# Patient Record
Sex: Male | Born: 1940 | Race: White | Hispanic: No | Marital: Married | Smoking: Former smoker
Health system: Southern US, Community
[De-identification: ages and names within clinical notes are randomized; demographics above are authoritative.]

## PROBLEM LIST (undated history)

## (undated) DIAGNOSIS — E039 Hypothyroidism, unspecified: Secondary | ICD-10-CM

## (undated) DIAGNOSIS — E079 Disorder of thyroid, unspecified: Secondary | ICD-10-CM

## (undated) DIAGNOSIS — C3492 Malignant neoplasm of unspecified part of left bronchus or lung: Secondary | ICD-10-CM

## (undated) DIAGNOSIS — H9190 Unspecified hearing loss, unspecified ear: Secondary | ICD-10-CM

## (undated) DIAGNOSIS — I10 Essential (primary) hypertension: Secondary | ICD-10-CM

## (undated) DIAGNOSIS — Z720 Tobacco use: Secondary | ICD-10-CM

## (undated) DIAGNOSIS — R0602 Shortness of breath: Secondary | ICD-10-CM

## (undated) DIAGNOSIS — D0330 Melanoma in situ of unspecified part of face: Secondary | ICD-10-CM

## (undated) DIAGNOSIS — J449 Chronic obstructive pulmonary disease, unspecified: Secondary | ICD-10-CM

## (undated) DIAGNOSIS — N400 Enlarged prostate without lower urinary tract symptoms: Secondary | ICD-10-CM

## (undated) DIAGNOSIS — C801 Malignant (primary) neoplasm, unspecified: Secondary | ICD-10-CM

## (undated) DIAGNOSIS — J45909 Unspecified asthma, uncomplicated: Secondary | ICD-10-CM

## (undated) DIAGNOSIS — R918 Other nonspecific abnormal finding of lung field: Secondary | ICD-10-CM

## (undated) HISTORY — DX: Tobacco use: Z72.0

## (undated) HISTORY — DX: Unspecified hearing loss, unspecified ear: H91.90

## (undated) HISTORY — DX: Shortness of breath: R06.02

## (undated) HISTORY — PX: NO PAST SURGERIES: SHX2092

## (undated) HISTORY — DX: Unspecified asthma, uncomplicated: J45.909

## (undated) HISTORY — DX: Disorder of thyroid, unspecified: E07.9

## (undated) HISTORY — DX: Melanoma in situ of unspecified part of face: D03.30

## (undated) HISTORY — PX: MELANOMA EXCISION: SHX5266

---
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
## 2014-10-31 ENCOUNTER — Ambulatory Visit (INDEPENDENT_AMBULATORY_CARE_PROVIDER_SITE_OTHER): Payer: Medicare Other

## 2014-10-31 ENCOUNTER — Ambulatory Visit (INDEPENDENT_AMBULATORY_CARE_PROVIDER_SITE_OTHER): Payer: Medicare Other | Admitting: Physician Assistant

## 2014-10-31 VITALS — BP 126/76 | HR 88 | Temp 98.8°F | Resp 20 | Ht 70.5 in | Wt 242.2 lb

## 2014-10-31 DIAGNOSIS — R059 Cough, unspecified: Secondary | ICD-10-CM

## 2014-10-31 DIAGNOSIS — R05 Cough: Secondary | ICD-10-CM | POA: Diagnosis not present

## 2014-10-31 DIAGNOSIS — F17200 Nicotine dependence, unspecified, uncomplicated: Secondary | ICD-10-CM | POA: Diagnosis not present

## 2014-10-31 DIAGNOSIS — R0989 Other specified symptoms and signs involving the circulatory and respiratory systems: Secondary | ICD-10-CM | POA: Diagnosis not present

## 2014-10-31 DIAGNOSIS — J3489 Other specified disorders of nose and nasal sinuses: Secondary | ICD-10-CM | POA: Diagnosis not present

## 2014-10-31 DIAGNOSIS — J189 Pneumonia, unspecified organism: Secondary | ICD-10-CM | POA: Diagnosis not present

## 2014-10-31 DIAGNOSIS — R0689 Other abnormalities of breathing: Secondary | ICD-10-CM

## 2014-10-31 DIAGNOSIS — J181 Lobar pneumonia, unspecified organism: Secondary | ICD-10-CM

## 2014-10-31 HISTORY — DX: Nicotine dependence, unspecified, uncomplicated: F17.200

## 2014-10-31 LAB — COMPREHENSIVE METABOLIC PANEL
ALT: 16 U/L (ref 0–53)
AST: 18 U/L (ref 0–37)
Albumin: 3.8 g/dL (ref 3.5–5.2)
Alkaline Phosphatase: 84 U/L (ref 39–117)
BUN: 11 mg/dL (ref 6–23)
CO2: 27 mEq/L (ref 19–32)
CREATININE: 1.29 mg/dL (ref 0.50–1.35)
Calcium: 8.6 mg/dL (ref 8.4–10.5)
Chloride: 101 mEq/L (ref 96–112)
Glucose, Bld: 95 mg/dL (ref 70–99)
Potassium: 4.3 mEq/L (ref 3.5–5.3)
Sodium: 139 mEq/L (ref 135–145)
Total Bilirubin: 0.6 mg/dL (ref 0.2–1.2)
Total Protein: 6.5 g/dL (ref 6.0–8.3)

## 2014-10-31 LAB — POCT CBC
Granulocyte percent: 62.3 %G (ref 37–80)
HEMATOCRIT: 51.4 % (ref 43.5–53.7)
Hemoglobin: 16.7 g/dL (ref 14.1–18.1)
LYMPH, POC: 1.8 (ref 0.6–3.4)
MCH, POC: 29.6 pg (ref 27–31.2)
MCHC: 32.4 g/dL (ref 31.8–35.4)
MCV: 91.3 fL (ref 80–97)
MID (cbc): 0.9 (ref 0–0.9)
MPV: 6.7 fL (ref 0–99.8)
POC GRANULOCYTE: 4.4 (ref 2–6.9)
POC LYMPH PERCENT: 25.4 %L (ref 10–50)
POC MID %: 12.3 %M — AB (ref 0–12)
Platelet Count, POC: 210 10*3/uL (ref 142–424)
RBC: 5.63 M/uL (ref 4.69–6.13)
RDW, POC: 14.3 %
WBC: 7.1 10*3/uL (ref 4.6–10.2)

## 2014-10-31 MED ORDER — AZITHROMYCIN 250 MG PO TABS: ORAL_TABLET | ORAL | Status: DC

## 2014-10-31 MED ORDER — IPRATROPIUM BROMIDE 0.02 % IN SOLN
0.5000 mg | Freq: Once | RESPIRATORY_TRACT | Status: AC
  Administered 2014-10-31: 0.5 mg via RESPIRATORY_TRACT

## 2014-10-31 MED ORDER — ALBUTEROL SULFATE (2.5 MG/3ML) 0.083% IN NEBU
2.5000 mg | INHALATION_SOLUTION | Freq: Once | RESPIRATORY_TRACT | Status: AC
  Administered 2014-10-31: 2.5 mg via RESPIRATORY_TRACT

## 2014-10-31 MED ORDER — BENZONATATE 100 MG PO CAPS: 100.0000 mg | ORAL_CAPSULE | Freq: Three times a day (TID) | ORAL | Status: DC | PRN

## 2014-10-31 NOTE — Patient Instructions (Signed)
You have a pneumonia.  Please take the antibiotic. Two tabs today and one tab a day for the next 4 days.  Take the tessalon every 8 hours as needed for the cough.  Please come back to see [place] asap if you start feeling worse.  I'd like to see you in the next few weeks for a complete physical.  I'm in the building next door on July 19th. I would love to see you for an appointment at that time if you can schedule.

## 2014-10-31 NOTE — Progress Notes (Signed)
Subjective:    Patient ID: Levi [name], male    DOB: 15-Sep-1940, 74 y.o.   MRN: [id number]  Chief Complaint  Patient presents with  . Cough    x 5 days  . Sinusitis    pressure, drainage, headache  . Shortness of Breath    wheezing   There are no active problems to display for this patient.  Prior to Admission medications   Medication Sig Start Date End Date Taking? Authorizing Provider  aspirin 81 MG tablet Take 81 mg by mouth daily.   Yes Historical Provider, MD  levothyroxine (SYNTHROID, LEVOTHROID) 175 MCG tablet Take 175 mcg by mouth daily before breakfast.   Yes Historical Provider, MD   Medications, allergies, past medical history, surgical history, family history, social history and problem list reviewed and updated.  HPI  88 yom presents with cough, sinus pressure, sob, wheezing.   Sx started 5 days ago with mildly prod cough and wheeze. Cough has persisted. Throughout day. Denies chills. Has felt warm but not checked temp. Malaise past few days. Denies abd pain, n/v, diarrhea. No known sick contacts. Current 1/2 ppd smoker. Has smoked 55 yrs. He is new to [place], no records on file. Only goes to MD when sick. Looking to establish primary care with [place].   Review of Systems No cp.     Objective:   Physical Exam  Constitutional: He appears well-developed and well-nourished.  Non-toxic appearance. He does not have a sickly appearance. He does not appear ill. No distress.  BP 126/76 mmHg  Pulse 88  Temp(Src) 98.8 F (37.1 C) (Oral)  Resp 20  Ht 5' 10.5" (1.791 m)  Wt 242 lb 3.2 oz (109.861 kg)  BMI 34.25 kg/m2  SpO2 91%   HENT:  Right Ear: Tympanic membrane normal.  Left Ear: Tympanic membrane normal.  Nose: Mucosal edema and rhinorrhea present. Right sinus exhibits no maxillary sinus tenderness and no frontal sinus tenderness. Left sinus exhibits maxillary sinus tenderness. Left sinus exhibits no frontal sinus tenderness.  Mouth/Throat: Uvula is midline and  mucous membranes are normal. Posterior oropharyngeal erythema present. No oropharyngeal exudate, posterior oropharyngeal edema or tonsillar abscesses.  Cardiovascular: Normal rate, regular rhythm and normal heart sounds.   Pulmonary/Chest: Effort normal. No tachypnea. He has decreased breath sounds. He has no wheezes. He has no rhonchi. He has no rales.  Breath sounds decreased diffusely.   Lymphadenopathy:       Head (right side): Submandibular adenopathy present. No submental and no tonsillar adenopathy present.       Head (left side): Submandibular adenopathy present. No submental and no tonsillar adenopathy present.    He has no cervical adenopathy.   [facility] reading (PRIMARY) by  Dr. Everlene Farrier. Chest findings: Prominent right perihilar area. Concern for mass? Atelectatic area vs linear infiltrate right base.   Duoneb breathing test done.  Lung sounds post duoneb: Improved. Moving air better. No appreciable rales.   Results for orders placed or performed in visit on 10/31/14  POCT CBC  Result Value Ref Range   WBC 7.1 4.6 - 10.2 K/uL   Lymph, poc 1.8 0.6 - 3.4   POC LYMPH PERCENT 25.4 10 - 50 %L   MID (cbc) 0.9 0 - 0.9   POC MID % 12.3 (A) 0 - 12 %M   POC Granulocyte 4.4 2 - 6.9   Granulocyte percent 62.3 37 - 80 %G   RBC 5.63 4.69 - 6.13 M/uL   Hemoglobin 16.7 14.1 - 18.1  g/dL   HCT, POC 51.4 43.5 - 53.7 %   MCV 91.3 80 - 97 fL   MCH, POC 29.6 27 - 31.2 pg   MCHC 32.4 31.8 - 35.4 g/dL   RDW, POC 14.3 %   Platelet Count, POC 210 142 - 424 K/uL   MPV 6.7 0 - 99.8 fL      Assessment & Plan:   74 yom presents with cough, sinus pressure, sob, wheezing.   Cough - Plan: DG Chest 2 View, albuterol (PROVENTIL) (2.5 MG/3ML) 0.083% nebulizer solution 2.5 mg, ipratropium (ATROVENT) nebulizer solution 0.5 mg, POCT CBC, Comprehensive metabolic panel, benzonatate (TESSALON) 100 MG capsule Decreased breath sounds - Plan: albuterol (PROVENTIL) (2.5 MG/3ML) 0.083% nebulizer solution 2.5 mg,  ipratropium (ATROVENT) nebulizer solution 0.5 mg, POCT CBC, Comprehensive metabolic panel Sinus pressure Smoking addiction Right middle lobe pneumonia - Plan: azithromycin (ZITHROMAX) 250 MG tablet --vitals stable, moving air better after breathing tx, no leukocytosis --likely RML pneumonia on xr, azithro 5 days --tessalon for cough, mucinex for congestion --encouraged to see [place] within 2 weeks in 104 for primary care check --> likely discuss low dose ncct due to smoking hx at that time, get cpe at that time as pt does not see provider regularly, pt agreeable to this  Julieta Gutting, PA-C Physician Assistant-Certified [facility]  10/31/2014 11:58 AM

## 2014-11-17 DIAGNOSIS — M545 Low back pain: Secondary | ICD-10-CM | POA: Diagnosis not present

## 2014-12-19 ENCOUNTER — Ambulatory Visit (INDEPENDENT_AMBULATORY_CARE_PROVIDER_SITE_OTHER): Payer: Medicare Other | Admitting: Family Medicine

## 2014-12-19 ENCOUNTER — Ambulatory Visit (INDEPENDENT_AMBULATORY_CARE_PROVIDER_SITE_OTHER): Payer: Medicare Other

## 2014-12-19 VITALS — BP 130/74 | HR 69 | Temp 97.6°F | Resp 16 | Ht 70.25 in | Wt 240.8 lb

## 2014-12-19 DIAGNOSIS — R300 Dysuria: Secondary | ICD-10-CM

## 2014-12-19 DIAGNOSIS — N200 Calculus of kidney: Secondary | ICD-10-CM

## 2014-12-19 LAB — POCT URINALYSIS DIPSTICK
Bilirubin, UA: NEGATIVE
Glucose, UA: NEGATIVE
Ketones, UA: NEGATIVE
Leukocytes, UA: NEGATIVE
NITRITE UA: NEGATIVE
PROTEIN UA: NEGATIVE
SPEC GRAV UA: 1.015
UROBILINOGEN UA: 0.2
pH, UA: 5

## 2014-12-19 LAB — POCT UA - MICROSCOPIC ONLY
CRYSTALS, UR, HPF, POC: NEGATIVE
Casts, Ur, LPF, POC: NEGATIVE
Epithelial cells, urine per micros: NEGATIVE
Mucus, UA: NEGATIVE
WBC, UR, HPF, POC: NEGATIVE
Yeast, UA: NEGATIVE

## 2014-12-19 MED ORDER — TAMSULOSIN HCL 0.4 MG PO CAPS: 0.4000 mg | ORAL_CAPSULE | Freq: Every day | ORAL | Status: DC

## 2014-12-19 MED ORDER — HYDROCODONE-ACETAMINOPHEN 5-325 MG PO TABS: 2.0000 | ORAL_TABLET | Freq: Four times a day (QID) | ORAL | Status: DC | PRN

## 2014-12-19 MED ORDER — NAPROXEN 500 MG PO TABS: 500.0000 mg | ORAL_TABLET | Freq: Two times a day (BID) | ORAL | Status: DC

## 2014-12-19 NOTE — Progress Notes (Signed)
12/19/2014 at 10:03 AM  Levi [name] / DOB: Nov 25, 1940 / MRN: [id number]  The patient has Smoking addiction on his problem list.  SUBJECTIVE  Levi [name] is a 74 y.o. well appearing male presenting for the chief complaint of left sided back pain with radiation to the left testicle and dysuria that started last night.  Reports he awoke to this pain and reports "it was the most severe pain I have felt." Associates nausea and emesis x 1.  Reports urinating this morning before coming here and says his symptoms had resolved by the time he got in the car. He has a 50 pack year history of smoking. He receives physicals yearly at the [place] and has never been told he has blood in his urine.  He denies a history of kidney stone.  He has tried nothing for the pain.       He  has a past medical history of Thyroid disease.    Medications reviewed and updated by myself where necessary, and exist elsewhere in the encounter.   Levi [name] has No Known Allergies. He  reports that he has been smoking.  He does not have any smokeless tobacco history on file. He reports that he does not drink alcohol or use illicit drugs. He  has no sexual activity history on file. The patient  has no past surgical history on file.  His family history includes Aneurysm in his mother; Cancer in his father.  Review of Systems  Constitutional: Negative for fever and chills.  Respiratory: Negative for shortness of breath.   Cardiovascular: Negative for chest pain.  Gastrointestinal: Negative for nausea and abdominal pain.  Genitourinary: Positive for dysuria. Negative for hematuria.  Musculoskeletal: Positive for back pain.  Skin: Negative for rash.  Neurological: Negative for dizziness and headaches.    OBJECTIVE  His  height is 5' 10.25" (1.784 m) and weight is 240 lb 12.8 oz (109.226 kg). His oral temperature is 97.6 F (36.4 C). His blood pressure is 130/74 and his pulse is 69. His respiration is 16 and oxygen  saturation is 91%.  The patient's body mass index is 34.32 kg/(m^2).  Physical Exam  Vitals reviewed. Constitutional: He is oriented to person, place, and time. He appears well-developed. No distress.  Eyes: EOM are normal. Pupils are equal, round, and reactive to light. No scleral icterus.  Neck: Normal range of motion.  Cardiovascular: Normal rate and regular rhythm.   Respiratory: Effort normal and breath sounds normal.  GI: He exhibits no distension. There is no tenderness. There is no rigidity, no guarding and no CVA tenderness.  Musculoskeletal: Normal range of motion.  Neurological: He is alert and oriented to person, place, and time. No cranial nerve deficit.  Skin: Skin is warm and dry. No rash noted. He is not diaphoretic.  Psychiatric: He has a normal mood and affect.    Results for orders placed or performed in visit on 12/19/14 (from the past 24 hour(s))  POCT urinalysis dipstick     Status: None   Collection Time: 12/19/14  9:13 AM  Result Value Ref Range   Color, UA yellow    Clarity, UA clear    Glucose, UA neg    Bilirubin, UA neg    Ketones, UA neg    Spec Grav, UA 1.015    Blood, UA mod    pH, UA 5.0    Protein, UA neg    Urobilinogen, UA 0.2    Nitrite, UA  neg    Leukocytes, UA Negative Negative  POCT UA - Microscopic Only     Status: None   Collection Time: 12/19/14  9:13 AM  Result Value Ref Range   WBC, Ur, HPF, POC neg    RBC, urine, microscopic TNTC    Bacteria, U Microscopic trace    Mucus, UA neg    Epithelial cells, urine per micros neg    Crystals, Ur, HPF, POC neg    Casts, Ur, LPF, POC neg    Yeast, UA neg    [facility] reading (PRIMARY) by  Dr. Linna Darner: Normal KUB.   ASSESSMENT & PLAN  Levi [name] was seen today for back pain, urinary urgency, testicular pain and vomitting.  Diagnoses and all orders for this visit:  Dysuria -     POCT urinalysis dipstick -     POCT UA - Microscopic Only -     DG Abd 1 View; Future  Nephrolithiasis: KUB  negative.  It appears he may have passed a stone.  Rx'd below and adivsed copious fluids.  He is to RTC in one week for a clean catch urine only.   -     naproxen (NAPROSYN) 500 MG tablet; Take 1 tablet (500 mg total) by mouth 2 (two) times daily with a meal. Take for 1 week only. -     HYDROcodone-acetaminophen (NORCO) 5-325 MG per tablet; Take 2 tablets by mouth every 6 (six) hours as needed. -     tamsulosin (FLOMAX) 0.4 MG CAPS capsule; Take 1 capsule (0.4 mg total) by mouth daily. -     POCT urinalysis dipstick; Future -     POCT UA - Microscopic Only; Future    The patient was advised to call or come back to clinic if he does not see an improvement in symptoms, or worsens with the above plan.   Philis Fendt, MHS, PA-C [facility] 12/19/2014 10:03 AM

## 2014-12-19 NOTE — Progress Notes (Signed)
Discussed with Philis Fendt PAC. No kidney stone could be seen on the x-ray. Treatment discussed. The importance of following up on hematuria was discussed and agreed upon plan.  Fenton Malling. Virgel Paling.D.

## 2014-12-26 ENCOUNTER — Other Ambulatory Visit (INDEPENDENT_AMBULATORY_CARE_PROVIDER_SITE_OTHER): Payer: Medicare Other | Admitting: *Deleted

## 2014-12-26 DIAGNOSIS — N200 Calculus of kidney: Secondary | ICD-10-CM

## 2014-12-26 LAB — POCT URINALYSIS DIPSTICK
BILIRUBIN UA: NEGATIVE
Glucose, UA: NEGATIVE
Ketones, UA: NEGATIVE
Leukocytes, UA: NEGATIVE
NITRITE UA: NEGATIVE
PH UA: 5
PROTEIN UA: NEGATIVE
RBC UA: NEGATIVE
Spec Grav, UA: 1.01
Urobilinogen, UA: 0.2

## 2014-12-26 LAB — POCT UA - MICROSCOPIC ONLY
Bacteria, U Microscopic: NEGATIVE
CASTS, UR, LPF, POC: NEGATIVE
CRYSTALS, UR, HPF, POC: NEGATIVE
Epithelial cells, urine per micros: NEGATIVE
Mucus, UA: NEGATIVE
RBC, URINE, MICROSCOPIC: NEGATIVE
WBC, Ur, HPF, POC: NEGATIVE
Yeast, UA: NEGATIVE

## 2014-12-31 ENCOUNTER — Other Ambulatory Visit: Payer: Self-pay | Admitting: Physician Assistant

## 2015-01-07 ENCOUNTER — Ambulatory Visit (INDEPENDENT_AMBULATORY_CARE_PROVIDER_SITE_OTHER): Payer: Medicare Other

## 2015-01-07 ENCOUNTER — Ambulatory Visit (INDEPENDENT_AMBULATORY_CARE_PROVIDER_SITE_OTHER): Payer: Medicare Other | Admitting: Emergency Medicine

## 2015-01-07 VITALS — BP 126/78 | HR 75 | Temp 98.1°F | Resp 18 | Ht 71.0 in | Wt 239.0 lb

## 2015-01-07 DIAGNOSIS — R058 Other specified cough: Secondary | ICD-10-CM

## 2015-01-07 DIAGNOSIS — J22 Unspecified acute lower respiratory infection: Secondary | ICD-10-CM

## 2015-01-07 DIAGNOSIS — J988 Other specified respiratory disorders: Secondary | ICD-10-CM

## 2015-01-07 DIAGNOSIS — R05 Cough: Secondary | ICD-10-CM

## 2015-01-07 DIAGNOSIS — J189 Pneumonia, unspecified organism: Secondary | ICD-10-CM

## 2015-01-07 MED ORDER — HYDROCOD POLST-CPM POLST ER 10-8 MG/5ML PO SUER: 5.0000 mL | Freq: Every evening | ORAL | Status: AC | PRN

## 2015-01-07 MED ORDER — ALBUTEROL SULFATE HFA 108 (90 BASE) MCG/ACT IN AERS: 2.0000 | INHALATION_SPRAY | RESPIRATORY_TRACT | Status: DC | PRN

## 2015-01-07 MED ORDER — DOXYCYCLINE HYCLATE 100 MG PO CAPS: 100.0000 mg | ORAL_CAPSULE | Freq: Two times a day (BID) | ORAL | Status: DC

## 2015-01-07 MED ORDER — GUAIFENESIN ER 1200 MG PO TB12: 1.0000 | ORAL_TABLET | Freq: Two times a day (BID) | ORAL | Status: DC | PRN

## 2015-01-07 MED ORDER — BENZONATATE 100 MG PO CAPS: 100.0000 mg | ORAL_CAPSULE | Freq: Three times a day (TID) | ORAL | Status: DC | PRN

## 2015-01-07 MED ORDER — AMOXICILLIN-POT CLAVULANATE 875-125 MG PO TABS: 1.0000 | ORAL_TABLET | Freq: Two times a day (BID) | ORAL | Status: AC

## 2015-01-07 NOTE — Progress Notes (Signed)
[facility] [address] [place] [address] [phone]  Date:  01/07/2015   Name:  Levi [name]   DOB:  Aug 15, 1940   MRN:  [id number]  PCP:  No PCP Per Patient    History of Present Illness:  Levi [name] is a 74 y.o. male patient who presents to [facility] for chief complaint of productive cough and fatigue for the last 5 days.  Phlegm is white.  He has mildly sore throat.  He denies sob or dyspnea, but states that his wife complains of his wheezing moreso over the last 5 days.  He had some nasal congestion that appears to be resolving for him.  He also feels some lightheadedness.  He has been working and staying busy around the home, but is more ocncerned of coughing at this time, as it has been keeping him up at night.  He is a smoker of 1 pk per 3-4 days.  He does not hydrate well at this time.  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  Recent abx use.  No abdominal pain, nausea, or vomiting.    Patient Active Problem List   Diagnosis Date Noted  . Smoking addiction 10/31/2014    Past Medical History  Diagnosis Date  . Thyroid disease     History reviewed. No pertinent past surgical history.  Social History  Substance Use Topics  . Smoking status: Current Every Day Smoker  . Smokeless tobacco: None  . Alcohol Use: No    Family History  Problem Relation Age of Onset  . Aneurysm Mother   . Cancer Father     No Known Allergies  Medication list has been reviewed and updated.  Current Outpatient Prescriptions on File Prior to Visit  Medication Sig Dispense Refill  . aspirin 81 MG tablet Take 81 mg by mouth daily.    Marland Kitchen HYDROcodone-acetaminophen (NORCO) 5-325 MG per tablet Take 2 tablets by mouth every 6 (six) hours as needed. 30 tablet 0  . levothyroxine (SYNTHROID, LEVOTHROID) 175 MCG tablet Take 175 mcg by mouth daily before breakfast.    . naproxen (NAPROSYN) 500 MG tablet Take 1 tablet (500 mg total) by mouth 2 (two) times daily  with a meal. Take for 1 week only. 30 tablet 0  . tamsulosin (FLOMAX) 0.4 MG CAPS capsule Take 1 capsule (0.4 mg total) by mouth daily. (Patient not taking: Reported on 01/07/2015) 30 capsule 3   No current facility-administered medications on file prior to visit.    ROS ROS otherwise unremarkable unless listed above.   Physical Examination: BP 126/78 mmHg  Pulse 75  Temp(Src) 98.1 F (36.7 C) (Oral)  Resp 18  Ht '5\' 11"'$  (1.803 m)  Wt 239 lb (108.41 kg)  BMI 33.35 kg/m2  SpO2 95% Ideal Body Weight: Weight in (lb) to have BMI = 25: 178.9  Physical Exam  Constitutional: He is oriented to person, place, and time. He appears well-developed and well-nourished. No distress.  HENT:  Head: Normocephalic and atraumatic.  Eyes: Conjunctivae and EOM are normal. Pupils are equal, round, and reactive to light.  Cardiovascular: Normal rate, regular rhythm, normal heart sounds and intact distal pulses.  Exam reveals no friction rub.   No murmur heard. Pulmonary/Chest: Effort normal and breath sounds normal. No respiratory distress. He has no wheezes.  Neurological: He is alert and oriented to person, place, and time.  Skin: Skin is warm and dry. He is not diaphoretic.  Psychiatric: He has  a normal mood and affect. His behavior is normal.    [facility] reading (PRIMARY) by  Dr. Everlene Farrier: Markings in the right base.  No consolidation.   Assessment and Plan: 74 year old male smoker who is here today for chief complaint of productive cough. -Given augmentin at this time.  Also supportive tx for cough with mucinex and cough suppressants.  Will avoid phototoxic abx. at this time.  -Smoking cessation discussed.  Likely culprit for recent illnesses.  CAP (community acquired pneumonia)  Lower respiratory infection (e.g., bronchitis, pneumonia, pneumonitis, pulmonitis) - Plan:  albuterol (PROVENTIL HFA;VENTOLIN HFA) 108 (90 BASE) MCG/ACT inhaler, amoxicillin-clavulanate (AUGMENTIN) 875-125 MG per tablet,  benzonatate (TESSALON) 100 MG capsule  Productive cough - Plan: DG Chest 2 View, Guaifenesin (MUCINEX MAXIMUM STRENGTH) 1200 MG TB12, chlorpheniramine-HYDROcodone (TUSSIONEX PENNKINETIC ER) 10-8 MG/5ML SUER, albuterol (PROVENTIL HFA;VENTOLIN HFA) 108 (90 BASE) MCG/ACT inhaler, benzonatate (TESSALON) 100 MG capsule   Ivar Drape, PA-C [facility] 01/07/2015 1:08 PM

## 2015-01-07 NOTE — Patient Instructions (Signed)
Please hydrate well, with 64oz of water per day.  (almost 4 regular sized water bottles) Use delsym over the counter for day cough.  Pneumonia Pneumonia is an infection of the lungs.  CAUSES Pneumonia may be caused by bacteria or a virus. Usually, these infections are caused by breathing infectious particles into the lungs (respiratory tract). SIGNS AND SYMPTOMS   Cough.  Fever.  Chest pain.  Increased rate of breathing.  Wheezing.  Mucus production. DIAGNOSIS  If you have the common symptoms of pneumonia, your health care provider will typically confirm the diagnosis with a chest X-ray. The X-ray will show an abnormality in the lung (pulmonary infiltrate) if you have pneumonia. Other tests of your blood, urine, or sputum may be done to find the specific cause of your pneumonia. Your health care provider may also do tests (blood gases or pulse oximetry) to see how well your lungs are working. TREATMENT  Some forms of pneumonia may be spread to other people when you cough or sneeze. You may be asked to wear a mask before and during your exam. Pneumonia that is caused by bacteria is treated with antibiotic medicine. Pneumonia that is caused by the influenza virus may be treated with an antiviral medicine. Most other viral infections must run their course. These infections will not respond to antibiotics.  HOME CARE INSTRUCTIONS   Cough suppressants may be used if you are losing too much rest. However, coughing protects you by clearing your lungs. You should avoid using cough suppressants if you can.  Your health care provider may have prescribed medicine if he or she thinks your pneumonia is caused by bacteria or influenza. Finish your medicine even if you start to feel better.  Your health care provider may also prescribe an expectorant. This loosens the mucus to be coughed up.  Take medicines only as directed by your health care provider.  Do not smoke. Smoking is a common cause of  bronchitis and can contribute to pneumonia. If you are a smoker and continue to smoke, your cough may last several weeks after your pneumonia has cleared.  A cold steam vaporizer or humidifier in your room or home may help loosen mucus.  Coughing is often worse at night. Sleeping in a semi-upright position in a recliner or using a couple pillows under your head will help with this.  Get rest as you feel it is needed. Your body will usually let you know when you need to rest. PREVENTION A pneumococcal shot (vaccine) is available to prevent a common bacterial cause of pneumonia. This is usually suggested for:  People over 23 years old.  Patients on chemotherapy.  People with chronic lung problems, such as bronchitis or emphysema.  People with immune system problems. If you are over 65 or have a high risk condition, you may receive the pneumococcal vaccine if you have not received it before. In some countries, a routine influenza vaccine is also recommended. This vaccine can help prevent some cases of pneumonia.You may be offered the influenza vaccine as part of your care. If you smoke, it is time to quit. You may receive instructions on how to stop smoking. Your health care provider can provide medicines and counseling to help you quit. SEEK MEDICAL CARE IF: You have a fever. SEEK IMMEDIATE MEDICAL CARE IF:   Your illness becomes worse. This is especially true if you are elderly or weakened from any other disease.  You cannot control your cough with suppressants and are losing  sleep.  You begin coughing up blood.  You develop pain which is getting worse or is uncontrolled with medicines.  Any of the symptoms which initially brought you in for treatment are getting worse rather than better.  You develop shortness of breath or chest pain. MAKE SURE YOU:   Understand these instructions.  Will watch your condition.  Will get help right away if you are not doing well or get  worse. Document Released: 04/18/2005 Document Revised: 09/02/2013 Document Reviewed: 07/08/2010 [facility] Patient Information 2015 [place], [place]. This information is not intended to replace advice given to you by your health care provider. Make sure you discuss any questions you have with your health care provider.

## 2015-10-16 ENCOUNTER — Ambulatory Visit (INDEPENDENT_AMBULATORY_CARE_PROVIDER_SITE_OTHER): Payer: Medicare Other | Admitting: Family Medicine

## 2015-10-16 VITALS — BP 114/70 | HR 97 | Temp 99.2°F | Resp 20 | Ht 71.0 in | Wt 238.2 lb

## 2015-10-16 DIAGNOSIS — J209 Acute bronchitis, unspecified: Secondary | ICD-10-CM | POA: Diagnosis not present

## 2015-10-16 DIAGNOSIS — Z720 Tobacco use: Secondary | ICD-10-CM | POA: Diagnosis not present

## 2015-10-16 MED ORDER — ALBUTEROL SULFATE HFA 108 (90 BASE) MCG/ACT IN AERS: 2.0000 | INHALATION_SPRAY | Freq: Four times a day (QID) | RESPIRATORY_TRACT | Status: DC | PRN

## 2015-10-16 MED ORDER — AZITHROMYCIN 500 MG PO TABS: 500.0000 mg | ORAL_TABLET | Freq: Every day | ORAL | Status: DC

## 2015-10-16 MED ORDER — GUAIFENESIN-CODEINE 100-10 MG/5ML PO SOLN: 5.0000 mL | ORAL | Status: DC | PRN

## 2015-10-16 NOTE — Progress Notes (Signed)
Subjective:  By signing my name below, I, Raven Small, attest that this documentation has been prepared under the direction and in the presence of Delman Cheadle , MD.  Electronically Signed: Thea Alken, ED Scribe. 10/16/2015. 8:32 AM.   Patient ID: Levi [name], male    DOB: 1941-04-15, 75 y.o.   MRN: [id number]  HPI Chief Complaint  Patient presents with  . Cough    x4 days, has had pneumonia in the past    HPI Comments: Levi [name] is a 75 y.o. male who presents to the [facility] complaining of a productive cough consisting of white sputum that began 4 days ago. Pt states symptoms started with sinus congestion.  Cough worsened 2 days ago and has been keeping him awake at night. Pt has tried Copywriter, advertising without relief to symptoms. Pt smokes about half a pack a day. He has hx of pnuemonia and reports current symptoms feel similar. Pt denies CP, SOB, palpitation, chest tightness.   Patient Active Problem List   Diagnosis Date Noted  . 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 Thyroid disease    History reviewed. No pertinent past surgical history. No Known Allergies Prior to Admission medications   Medication Sig Start Date End Date Taking? Authorizing Provider  aspirin 81 MG tablet Take 81 mg by mouth daily.   Yes Historical Provider, MD  levothyroxine (SYNTHROID, LEVOTHROID) 175 MCG tablet Take 175 mcg by mouth daily before breakfast.   Yes Historical Provider, MD   Review of Systems  Constitutional: Negative for fever and chills.  HENT: Positive for congestion. Negative for sore throat.   Respiratory: Positive for cough. Negative for chest tightness and shortness of breath.   Cardiovascular: Negative for chest pain.       Objective:   Physical Exam  Constitutional: He is oriented to person, place, and time. He appears well-developed and well-nourished. No distress.  HENT:  Head: Normocephalic and atraumatic.  Right  Ear: Tympanic membrane is retracted.  Left Ear: Tympanic membrane is injected and retracted.  Mouth/Throat: Posterior oropharyngeal erythema present.  Nares with erythema  Eyes: Conjunctivae and EOM are normal.  Neck: Neck supple.  Cardiovascular: Normal rate, regular rhythm, S1 normal and S2 normal.   No murmur heard. Pulmonary/Chest: Effort normal. He has rales ( slight, inspiratory, cleared with normal breathing).  Good air moement   Musculoskeletal: Normal range of motion.  Lymphadenopathy:       Head (right side): Submandibular adenopathy present.       Head (left side): Submandibular adenopathy present.    He has cervical adenopathy ( anterior).       Right cervical: No posterior cervical adenopathy present.      Left cervical: No posterior cervical adenopathy present.       Right: No supraclavicular adenopathy present.       Left: No supraclavicular adenopathy present.  Neurological: He is alert and oriented to person, place, and time.  Skin: Skin is warm and dry.  Psychiatric: He has a normal mood and affect. His behavior is normal.  Nursing note and vitals reviewed.   Filed Vitals:   10/16/15 0825  BP: 114/70  Pulse: 97  Temp: 99.2 F (37.3 C)  TempSrc: Oral  Resp: 20  Height: '5\' 11"'$  (1.803 m)  Weight: 238 lb 3.2 oz (108.047 kg)  SpO2: 95%    Assessment & Plan:   1. Acute bronchitis, unspecified organism  2. Tobacco abuse     Meds ordered this encounter  Medications  . azithromycin (ZITHROMAX) 500 MG tablet    Sig: Take 1 tablet (500 mg total) by mouth daily.    Dispense:  3 tablet    Refill:  0  . guaiFENesin-codeine 100-10 MG/5ML syrup    Sig: Take 5-10 mLs by mouth every 4 (four) hours as needed for cough.    Dispense:  180 mL    Refill:  0    Pt told to RTC if no significant improvement in 3 days   I personally performed the services described in this documentation, which was scribed in my presence. The recorded information has been reviewed and  considered, and addended by me as needed.   Delman Cheadle, M.D.  [facility] [address] [place], [place] [address] [phone] phone [phone] fax  10/16/2015 9:51 AM

## 2015-10-16 NOTE — Patient Instructions (Addendum)
   IF you received an x-ray today, you will receive an invoice from [facility]. Please contact [facility] at [phone] with questions or concerns regarding your invoice.   IF you received labwork today, you will receive an invoice from [facility]. Please contact [facility] at [phone] with questions or concerns regarding your invoice.   Our billing staff will not be able to assist you with questions regarding bills from these companies.  You will be contacted with the lab results as soon as they are available. The fastest way to get your results is to activate your My Chart account. Instructions are located on the last page of this paperwork. If you have not heard from us regarding the results in 2 weeks, please contact this office.    Acute Bronchitis Bronchitis is inflammation of the airways that extend from the windpipe into the lungs (bronchi). The inflammation often causes mucus to develop. This leads to a cough, which is the most common symptom of bronchitis.  In acute bronchitis, the condition usually develops suddenly and goes away over time, usually in a couple weeks. Smoking, allergies, and asthma can make bronchitis worse. Repeated episodes of bronchitis may cause further lung problems.  CAUSES Acute bronchitis is most often caused by the same virus that causes a cold. The virus can spread from person to person (contagious) through coughing, sneezing, and touching contaminated objects. SIGNS AND SYMPTOMS   Cough.   Fever.   Coughing up mucus.   Body aches.   Chest congestion.   Chills.   Shortness of breath.   Sore throat.  DIAGNOSIS  Acute bronchitis is usually diagnosed through a physical exam. Your health care provider will also ask you questions about your medical history. Tests, such as chest X-rays, are sometimes done to rule out other conditions.  TREATMENT  Acute bronchitis usually goes away in a  couple weeks. Oftentimes, no medical treatment is necessary. Medicines are sometimes given for relief of fever or cough. Antibiotic medicines are usually not needed but may be prescribed in certain situations. In some cases, an inhaler may be recommended to help reduce shortness of breath and control the cough. A cool mist vaporizer may also be used to help thin bronchial secretions and make it easier to clear the chest.  HOME CARE INSTRUCTIONS  Get plenty of rest.   Drink enough fluids to keep your urine clear or pale yellow (unless you have a medical condition that requires fluid restriction). Increasing fluids may help thin your respiratory secretions (sputum) and reduce chest congestion, and it will prevent dehydration.   Take medicines only as directed by your health care provider.  If you were prescribed an antibiotic medicine, finish it all even if you start to feel better.  Avoid smoking and secondhand smoke. Exposure to cigarette smoke or irritating chemicals will make bronchitis worse. If you are a smoker, consider using nicotine gum or skin patches to help control withdrawal symptoms. Quitting smoking will help your lungs heal faster.   Reduce the chances of another bout of acute bronchitis by washing your hands frequently, avoiding people with cold symptoms, and trying not to touch your hands to your mouth, nose, or eyes.   Keep all follow-up visits as directed by your health care provider.  SEEK MEDICAL CARE IF: Your symptoms do not improve after 1 week of treatment.  SEEK IMMEDIATE MEDICAL CARE IF:  You develop an increased fever or chills.   You have chest pain.     You have severe shortness of breath.  You have bloody sputum.   You develop dehydration.  You faint or repeatedly feel like you are going to pass out.  You develop repeated vomiting.  You develop a severe headache. MAKE SURE YOU:   Understand these instructions.  Will watch your  condition.  Will get help right away if you are not doing well or get worse.   This information is not intended to replace advice given to you by your health care provider. Make sure you discuss any questions you have with your health care provider.   Document Released: 05/26/2004 Document Revised: 05/09/2014 Document Reviewed: 10/09/2012 Elsevier Interactive Patient Education 2016 Elsevier Inc.  

## 2015-10-16 NOTE — Addendum Note (Signed)
Addended by: Jannette Spanner on: 10/16/2015 10:13 AM   Modules accepted: Orders

## 2015-10-20 ENCOUNTER — Ambulatory Visit (INDEPENDENT_AMBULATORY_CARE_PROVIDER_SITE_OTHER): Payer: Medicare Other

## 2015-10-20 ENCOUNTER — Ambulatory Visit (INDEPENDENT_AMBULATORY_CARE_PROVIDER_SITE_OTHER): Payer: Medicare Other | Admitting: Family Medicine

## 2015-10-20 VITALS — BP 122/74 | HR 74 | Temp 98.2°F | Resp 18 | Ht 71.0 in | Wt 233.0 lb

## 2015-10-20 DIAGNOSIS — R05 Cough: Secondary | ICD-10-CM | POA: Diagnosis not present

## 2015-10-20 DIAGNOSIS — J439 Emphysema, unspecified: Secondary | ICD-10-CM | POA: Diagnosis not present

## 2015-10-20 DIAGNOSIS — F17200 Nicotine dependence, unspecified, uncomplicated: Secondary | ICD-10-CM | POA: Diagnosis not present

## 2015-10-20 DIAGNOSIS — J441 Chronic obstructive pulmonary disease with (acute) exacerbation: Secondary | ICD-10-CM

## 2015-10-20 LAB — POCT CBC
Granulocyte percent: 74.1 %G (ref 37–80)
HEMATOCRIT: 46.9 % (ref 43.5–53.7)
Hemoglobin: 16.5 g/dL (ref 14.1–18.1)
Lymph, poc: 1.6 (ref 0.6–3.4)
MCH: 31.6 pg — AB (ref 27–31.2)
MCHC: 35.2 g/dL (ref 31.8–35.4)
MCV: 90 fL (ref 80–97)
MID (CBC): 0.7 (ref 0–0.9)
MPV: 7.6 fL (ref 0–99.8)
POC GRANULOCYTE: 6.7 (ref 2–6.9)
POC LYMPH PERCENT: 17.9 %L (ref 10–50)
POC MID %: 8 % (ref 0–12)
Platelet Count, POC: 198 10*3/uL (ref 142–424)
RBC: 5.21 M/uL (ref 4.69–6.13)
RDW, POC: 13.3 %
WBC: 9.1 10*3/uL (ref 4.6–10.2)

## 2015-10-20 MED ORDER — PREDNISONE 20 MG PO TABS: 40.0000 mg | ORAL_TABLET | Freq: Every day | ORAL | Status: DC

## 2015-10-20 MED ORDER — AMOXICILLIN-POT CLAVULANATE 875-125 MG PO TABS: 1.0000 | ORAL_TABLET | Freq: Two times a day (BID) | ORAL | Status: DC

## 2015-10-20 MED ORDER — GUAIFENESIN-CODEINE 100-10 MG/5ML PO SOLN: 5.0000 mL | ORAL | Status: DC | PRN

## 2015-10-20 MED ORDER — IPRATROPIUM BROMIDE 0.02 % IN SOLN
0.5000 mg | Freq: Once | RESPIRATORY_TRACT | Status: AC
  Administered 2015-10-20: 0.5 mg via RESPIRATORY_TRACT

## 2015-10-20 MED ORDER — ALBUTEROL SULFATE (2.5 MG/3ML) 0.083% IN NEBU
2.5000 mg | INHALATION_SOLUTION | Freq: Once | RESPIRATORY_TRACT | Status: AC
  Administered 2015-10-20: 2.5 mg via RESPIRATORY_TRACT

## 2015-10-20 NOTE — Patient Instructions (Addendum)
IF you received an x-ray today, you will receive an invoice from [facility]. Please contact [facility] at [phone] with questions or concerns regarding your invoice.   IF you received labwork today, you will receive an invoice from [employer]. Please contact [facility] at [phone] with questions or concerns regarding your invoice.   Our billing staff will not be able to assist you with questions regarding bills from these companies.  You will be contacted with the lab results as soon as they are available. The fastest way to get your results is to activate your My Chart account. Instructions are located on the last page of this paperwork. If you have not heard from [place] regarding the results in 2 weeks, please contact this office.    Chronic Obstructive Pulmonary Disease Chronic obstructive pulmonary disease (COPD) is a common lung condition in which airflow from the lungs is limited. COPD is a general term that can be used to describe many different lung problems that limit airflow, including both chronic bronchitis and emphysema. If you have COPD, your lung function will probably never return to normal, but there are measures you can take to improve lung function and make yourself feel better. CAUSES   Smoking (common).  Exposure to secondhand smoke.  Genetic problems.  Chronic inflammatory lung diseases or recurrent infections. SYMPTOMS  Shortness of breath, especially with physical activity.  Deep, persistent (chronic) cough with a large amount of thick mucus.  Wheezing.  Rapid breaths (tachypnea).  Gray or bluish discoloration (cyanosis) of the skin, especially in your fingers, toes, or lips.  Fatigue.  Weight loss.  Frequent infections or episodes when breathing symptoms become much worse (exacerbations).  Chest tightness. DIAGNOSIS Your health care provider will take a medical history and perform a physical  examination to diagnose COPD. Additional tests for COPD may include:  Lung (pulmonary) function tests.  Chest X-ray.  CT scan.  Blood tests. TREATMENT  Treatment for COPD may include:  Inhaler and nebulizer medicines. These help manage the symptoms of COPD and make your breathing more comfortable.  Supplemental oxygen. Supplemental oxygen is only helpful if you have a low oxygen level in your blood.  Exercise and physical activity. These are beneficial for nearly all people with COPD.  Lung surgery or transplant.  Nutrition therapy to gain weight, if you are underweight.  Pulmonary rehabilitation. This may involve working with a team of health care providers and specialists, such as respiratory, occupational, and physical therapists. HOME CARE INSTRUCTIONS  Take all medicines (inhaled or pills) as directed by your health care provider.  Avoid over-the-counter medicines or cough syrups that dry up your airway (such as antihistamines) and slow down the elimination of secretions unless instructed otherwise by your health care provider.  If you are a smoker, the most important thing that you can do is stop smoking. Continuing to smoke will cause further lung damage and breathing trouble. Ask your health care provider for help with quitting smoking. He or she can direct you to community resources or hospitals that provide support.  Avoid exposure to irritants such as smoke, chemicals, and fumes that aggravate your breathing.  Use oxygen therapy and pulmonary rehabilitation if directed by your health care provider. If you require home oxygen therapy, ask your health care provider whether you should purchase a pulse oximeter to measure your oxygen level at home.  Avoid contact with individuals who have a contagious illness.  Avoid extreme temperature and humidity changes.  Eat healthy foods. Eating smaller, more frequent meals and resting before meals may help you maintain your  strength.  Stay active, but balance activity with periods of rest. Exercise and physical activity will help you maintain your ability to do things you want to do.  Preventing infection and hospitalization is very important when you have COPD. Make sure to receive all the vaccines your health care provider recommends, especially the pneumococcal and influenza vaccines. Ask your health care provider whether you need a pneumonia vaccine.  Learn and use relaxation techniques to manage stress.  Learn and use controlled breathing techniques as directed by your health care provider. Controlled breathing techniques include:  Pursed lip breathing. Start by breathing in (inhaling) through your nose for 1 second. Then, purse your lips as if you were going to whistle and breathe out (exhale) through the pursed lips for 2 seconds.  Diaphragmatic breathing. Start by putting one hand on your abdomen just above your waist. Inhale slowly through your nose. The hand on your abdomen should move out. Then purse your lips and exhale slowly. You should be able to feel the hand on your abdomen moving in as you exhale.  Learn and use controlled coughing to clear mucus from your lungs. Controlled coughing is a series of short, progressive coughs. The steps of controlled coughing are: 1. Lean your head slightly forward. 2. Breathe in deeply using diaphragmatic breathing. 3. Try to hold your breath for 3 seconds. 4. Keep your mouth slightly open while coughing twice. 5. Spit any mucus out into a tissue. 6. Rest and repeat the steps once or twice as needed. SEEK MEDICAL CARE IF:  You are coughing up more mucus than usual.  There is a change in the color or thickness of your mucus.  Your breathing is more labored than usual.  Your breathing is faster than usual. SEEK IMMEDIATE MEDICAL CARE IF:  You have shortness of breath while you are resting.  You have shortness of breath that prevents you from:  Being  able to talk.  Performing your usual physical activities.  You have chest pain lasting longer than 5 minutes.  Your skin color is more cyanotic than usual.  You measure low oxygen saturations for longer than 5 minutes with a pulse oximeter. MAKE SURE YOU:  Understand these instructions.  Will watch your condition.  Will get help right away if you are not doing well or get worse.   This information is not intended to replace advice given to you by your health care provider. Make sure you discuss any questions you have with your health care provider.   Document Released: 01/26/2005 Document Revised: 05/09/2014 Document Reviewed: 12/13/2012 Elsevier Interactive Patient Education [employer].

## 2015-10-20 NOTE — Progress Notes (Signed)
By signing my name below, I, Mesha Guinyard, attest that this documentation has been prepared under the direction and in the presence of Delman Cheadle, MD.  Electronically Signed: Verlee Monte, Medical Scribe. 10/20/2015. 9:36 AM.  Subjective:    Patient ID: Levi [name], male    DOB: 1940-10-19, 75 y.o.   MRN: [id number]  HPI Chief Complaint  Patient presents with  . Follow-up  . Bronchitis    HPI Comments: Levi [name] is a 75 y.o. male who presents to the [facility] for follow-up. Pt was seen 4 days ago for cough and congestion. He smokes .5 PPP and PMHx of PNA. He had a relative regular nl pulomanry exam- so placed on ZITHROMAX 500 mg tablets a day for 3 days, which he would have finished yesterday, as well as Codene cough syrup that has helped his symptoms and Albuerol BID. Pt took OTC BC for his sore throat. Pt thought he was feeling better 3 days ago but his symptoms came back. Pt reports productive cough with white sputum- doesn't nl cough stuff up. Pt can't take deep breaths. Pt tried to eat an egg 3 days ago and he had heart burn afterwards- as only eaten a bowl of oatmeal in the past 3 days. Pt has only had 2-3 cigarrettes a day. Pt doesn't remember taking Prednisone in the past, but his wife takes it.  Patient Active Problem List   Diagnosis Date Noted  . 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 Thyroid disease    History reviewed. No pertinent past surgical history. No Known Allergies Prior to Admission medications   Medication Sig Start Date End Date Taking? Authorizing Provider  albuterol (PROVENTIL HFA;VENTOLIN HFA) 108 (90 Base) MCG/ACT inhaler Inhale 2 puffs into the lungs every 6 (six) hours as needed for wheezing or shortness of breath. 10/16/15  Yes Shawnee Knapp, MD  aspirin 81 MG tablet Take 81 mg by mouth daily.   Yes Historical Provider, MD  guaiFENesin-codeine 100-10 MG/5ML syrup Take 5-10 mLs by mouth every  4 (four) hours as needed for cough. 10/16/15  Yes Shawnee Knapp, MD  levothyroxine (SYNTHROID, LEVOTHROID) 175 MCG tablet Take 175 mcg by mouth daily before breakfast.   Yes Historical Provider, MD  azithromycin (ZITHROMAX) 500 MG tablet Take 1 tablet (500 mg total) by mouth daily. Patient not taking: Reported on 10/20/2015 10/16/15   Shawnee Knapp, MD   Social History   Social History  . Marital Status: Unknown    Spouse Name: N/A  . Number of Children: N/A  . Years of Education: N/A   Occupational History  . Not on file.   Social History Main Topics  . Smoking status: Current Every Day Smoker  . Smokeless tobacco: Not on file  . Alcohol Use: No  . Drug Use: No  . Sexual Activity: Not on file   Other Topics Concern  . Not on file   Social History Narrative   Depression screen [facility] 2/9 10/20/2015 10/16/2015 01/07/2015 12/19/2014 10/31/2014  Decreased Interest 0 0 0 0 0  Down, Depressed, Hopeless 0 0 0 0 0  PHQ - 2 Score 0 0 0 0 0   Review of Systems  Constitutional: Positive for appetite change.  HENT: Positive for congestion, sinus pressure and sore throat. Negative for ear pain.   Respiratory: Positive for cough and shortness of breath.   Cardiovascular: Negative for chest pain and palpitations.  Objective:  BP 122/74 mmHg  Pulse 74  Temp(Src) 98.2 F (36.8 C) (Oral)  Resp 18  Ht '5\' 11"'$  (1.803 m)  Wt 233 lb (105.688 kg)  BMI 32.51 kg/m2  SpO2 95%  Physical Exam  Constitutional: He appears well-developed and well-nourished. No distress.  HENT:  Head: Normocephalic and atraumatic.  Right Ear: Tympanic membrane is injected and retracted.  Left Ear: Tympanic membrane is injected and retracted.  Mouth/Throat: Posterior oropharyngeal erythema present.  Toungue with white coating Nares nl  Eyes: Conjunctivae are normal.  Neck: Neck supple.  Cardiovascular: Normal rate and regular rhythm.  Exam reveals distant heart sounds.   Pulmonary/Chest: Effort normal.  Decreased air  movement of right lung base Expiratory rhonchi in left lung base  Neurological: He is alert.  Skin: Skin is warm and dry.  Psychiatric: He has a normal mood and affect. His behavior is normal.  Nursing note and vitals reviewed.  Results for orders placed or performed in visit on 10/20/15  POCT CBC  Result Value Ref Range   WBC 9.1 4.6 - 10.2 K/uL   Lymph, poc 1.6 0.6 - 3.4   POC LYMPH PERCENT 17.9 10 - 50 %L   MID (cbc) 0.7 0 - 0.9   POC MID % 8.0 0 - 12 %M   POC Granulocyte 6.7 2 - 6.9   Granulocyte percent 74.1 37 - 80 %G   RBC 5.21 4.69 - 6.13 M/uL   Hemoglobin 16.5 14.1 - 18.1 g/dL   HCT, POC 46.9 43.5 - 53.7 %   MCV 90.0 80 - 97 fL   MCH, POC 31.6 (A) 27 - 31.2 pg   MCHC 35.2 31.8 - 35.4 g/dL   RDW, POC 13.3 %   Platelet Count, POC 198 142 - 424 K/uL   MPV 7.6 0 - 99.8 fL   Dg Chest 2 View  10/20/2015  CLINICAL DATA:  75 year old male with cough and congestion. Decreased air movement on auscultation of the lung bases. Initial encounter. COPD. EXAM: CHEST  2 VIEW COMPARISON:  01/07/2015, 10/31/2014. FINDINGS: Stable large lung volumes. Mediastinal contours are stable and within normal limits. Visualized tracheal air column is within normal limits. No pneumothorax or pulmonary edema. No pleural effusion or consolidation. No acute pulmonary opacity at the lung bases. Mild diffuse increased interstitial markings in both lungs with some attenuation of upper lobe bronchovascular markings, more so on the right. No acute osseous abnormality identified. 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 No acute cardiopulmonary abnormality identified. Electronically Signed   By: Genevie Ann M.D.   On: 10/20/2015 10:02   [10:27 AM] Pt states the Albuterol treatment has losened up his chest. Decreased air movement throughout.   Assessment & Plan:   1. COPD exacerbation ([place])   failed azithro 500 qd x 3d. Will try augmentin instead since he is having sinusitis sxs as well and treat  copd with prednisone burst. Ongoing tob use - encouraged cessation. Scheduled nebs qid while ill.  Orders Placed This Encounter  Procedures  . DG Chest 2 View    Standing Status: Future     Number of Occurrences: 1     Standing Expiration Date: 10/19/2016    Order Specific Question:  Reason for Exam (SYMPTOM  OR DIAGNOSIS REQUIRED)    Answer:  cough and congestion, decreased air movment at bases, copd    Order Specific Question:  Preferred imaging location?    Answer:  External  . POCT CBC    Meds ordered  this encounter  Medications  . albuterol (PROVENTIL) (2.5 MG/3ML) 0.083% nebulizer solution 2.5 mg    Sig:   . ipratropium (ATROVENT) nebulizer solution 0.5 mg    Sig:   . predniSONE (DELTASONE) 20 MG tablet    Sig: Take 2 tablets (40 mg total) by mouth daily with breakfast.    Dispense:  10 tablet    Refill:  0  . amoxicillin-clavulanate (AUGMENTIN) 875-125 MG tablet    Sig: Take 1 tablet by mouth 2 (two) times daily.    Dispense:  20 tablet    Refill:  0  . guaiFENesin-codeine 100-10 MG/5ML syrup    Sig: Take 5-10 mLs by mouth every 4 (four) hours as needed for cough.    Dispense:  180 mL    Refill:  0    I personally performed the services described in this documentation, which was scribed in my presence. The recorded information has been reviewed and considered, and addended by me as needed.   Delman Cheadle, M.D.  [facility] [address] [place], [place] [address] [phone] phone [phone] fax  10/23/2015 10:22 AM

## 2015-10-23 DIAGNOSIS — J439 Emphysema, unspecified: Secondary | ICD-10-CM | POA: Insufficient documentation

## 2016-12-23 ENCOUNTER — Telehealth: Payer: Self-pay | Admitting: Internal Medicine

## 2016-12-23 NOTE — Telephone Encounter (Signed)
Patient Is on 2018 [facility] Patient Reassign for the [facility] Priority list for the [facility] Medicare.  [facility] asks [place] to contact patient to come in for an appointment or do they have another PCP?    Spoke with wife, Amando Chaput who states that patient does not see Dr. Delman Cheadle and has never seen Dr. Brigitte Pulse.  Advised that he had seen her back in June, 2017.  Both she and patient say no, he hasn't seen her.  Went over why he was there and then they both say, yes he did see her for that.  However, patient see the [facility].  And, he goes back to the [facility] on 8/30.  However, Dr. Renold Genta IS on the patient's card as PCP.  I asked the wife to call [facility] and have them remove Dr. Renold Genta as the PCP and replace with his doctor's name at the [place].    The wife said they initially said that Bevelyn Buckles would be his PCP (because that is the wife's PCP), but he has never seen Dr. Sharlett Iles either.  So, wife is supposed to get a name on 8/30 when he goes back to the [facility] and then call [facility] and notify them of the PCP name at that time and get Dr. Verlene Mayer name off of the his current [employer] card.   They do NOT want to set up an appointment with Dr. Renold Genta.    Call #1

## 2018-07-02 ENCOUNTER — Other Ambulatory Visit (HOSPITAL_COMMUNITY): Payer: Self-pay | Admitting: Family Medicine

## 2018-07-10 ENCOUNTER — Telehealth: Payer: Self-pay | Admitting: *Deleted

## 2018-07-10 ENCOUNTER — Encounter: Payer: Self-pay | Admitting: *Deleted

## 2018-07-10 DIAGNOSIS — Z122 Encounter for screening for malignant neoplasm of respiratory organs: Secondary | ICD-10-CM

## 2018-07-10 DIAGNOSIS — Z87891 Personal history of nicotine dependence: Secondary | ICD-10-CM

## 2018-07-10 NOTE — Telephone Encounter (Signed)
Received referral for initial lung cancer screening scan. Contacted patient and obtained smoking history,( current, 62 pack year ) as well as answering questions related to screening process. Patient denies signs of lung cancer such as weight loss or hemoptysis. Patient denies comorbidity that would prevent curative treatment if lung cancer were found. Patient is scheduled for shared decision making visit and CT scan on 07/31/18 at 2pm.

## 2018-07-12 ENCOUNTER — Other Ambulatory Visit: Payer: Self-pay | Admitting: Family Medicine

## 2018-07-12 DIAGNOSIS — N19 Unspecified kidney failure: Secondary | ICD-10-CM

## 2018-07-23 ENCOUNTER — Telehealth: Payer: Self-pay | Admitting: *Deleted

## 2018-07-23 NOTE — Telephone Encounter (Signed)
Patient notified/message left to notify patient that due to current restrictions lung screening appointments are cancelled and patient will be contacted regarding rescheduling.

## 2018-07-31 ENCOUNTER — Inpatient Hospital Stay: Payer: Medicare Other | Admitting: Oncology

## 2018-07-31 ENCOUNTER — Ambulatory Visit: Payer: Medicare Other

## 2018-07-31 ENCOUNTER — Ambulatory Visit
Admission: RE | Admit: 2018-07-31 | Discharge: 2018-07-31 | Disposition: A | Discharge status: Home / Self Care | Payer: No Typology Code available for payment source | Source: Ambulatory Visit | Attending: Family Medicine | Admitting: Family Medicine

## 2018-07-31 ENCOUNTER — Other Ambulatory Visit: Payer: Self-pay

## 2018-07-31 DIAGNOSIS — N19 Unspecified kidney failure: Secondary | ICD-10-CM | POA: Insufficient documentation

## 2018-09-12 ENCOUNTER — Telehealth: Payer: Self-pay | Admitting: *Deleted

## 2018-09-12 NOTE — Telephone Encounter (Signed)
Contacted and rescheduled for lung screening scan, see prior note for eligibility information.

## 2018-09-21 ENCOUNTER — Other Ambulatory Visit: Payer: Self-pay

## 2018-09-21 ENCOUNTER — Ambulatory Visit
Admission: RE | Admit: 2018-09-21 | Discharge: 2018-09-21 | Disposition: A | Discharge status: Home / Self Care | Payer: Medicare Other | Source: Ambulatory Visit | Attending: Oncology | Admitting: Oncology

## 2018-09-21 ENCOUNTER — Inpatient Hospital Stay: Payer: Medicare Other | Attending: Nurse Practitioner | Admitting: Nurse Practitioner

## 2018-09-21 DIAGNOSIS — Z122 Encounter for screening for malignant neoplasm of respiratory organs: Secondary | ICD-10-CM | POA: Diagnosis not present

## 2018-09-21 DIAGNOSIS — Z87891 Personal history of nicotine dependence: Secondary | ICD-10-CM

## 2018-09-21 NOTE — Progress Notes (Signed)
Virtual Visit via Telemedicine Note   I connected with Levi [name] on 09/21/18 EST at 11:30 by video enabled telemedicine visit and verified that I am speaking with the correct person using two identifiers.   I discussed the limitations, risks, security and privacy concerns of performing an evaluation and management service by telemedicine and the availability of in-person appointments. I also discussed with the patient that there may be a patient responsible charge related to this service. The patient expressed understanding and agreed to proceed.   Other persons participating in the visit and their role in the encounter: Burgess Estelle, RN  Patient's location: clinic  Provider's location: home  Chief Complaint: Low Dose CT Screening  Patient agreed to evaluation by telephone/telemedicine to discuss shared decision making for consideration of low dose CT lung cancer screening.    In accordance with CMS guidelines, patient has met eligibility criteria including age, absence of signs or symptoms of lung cancer.  Social History   Tobacco Use  . Smoking status: Current Every Day Smoker    Packs/day: 1.00    Years: 62.00    Pack years: 62.00    Types: Cigarettes  Substance Use Topics  . Alcohol use: No    Alcohol/week: 0.0 standard drinks     A shared decision-making session was conducted prior to the performance of CT scan. This includes one or more decision aids, includes benefits and harms of screening, follow-up diagnostic testing, over-diagnosis, false positive rate, and total radiation exposure.   Counseling on the importance of adherence to annual lung cancer LDCT screening, impact of co-morbidities, and ability or willingness to undergo diagnosis and treatment is imperative for compliance of the program.   Counseling on the importance of continued smoking cessation for former smokers; the importance of smoking cessation for current smokers, and information about tobacco  cessation interventions have been given to patient including [place] and [address] quit [place] programs.   Written order for lung cancer screening with LDCT has been given to the patient and any and all questions have been answered to the best of my abilities.    Yearly follow up will be coordinated by Burgess Estelle, Thoracic Navigator.  I discussed the assessment and treatment plan with the patient. The patient was provided an opportunity to ask questions and all were answered. The patient agreed with the plan and demonstrated an understanding of the instructions.   The patient was advised to call back or seek an in-person evaluation if the symptoms worsen or if the condition fails to improve as anticipated.   I provided 15 minutes of face-to-face video visit time during this encounter, and > 50% was spent counseling as documented under my assessment & plan.   Beckey Rutter, DNP, AGNP-C [place] at [facility] [phone] (work cell) [phone] (office)

## 2018-09-25 ENCOUNTER — Telehealth: Payer: Self-pay | Admitting: *Deleted

## 2018-09-25 NOTE — Telephone Encounter (Signed)
Left voicemail with PCP's nurse to return call. Dr[facility]

## 2018-09-25 NOTE — Telephone Encounter (Signed)
Noted LDCT results, contacted PCP office, [facility]. Left message for return call to discuss who would manage recommended follow up of suspicious finding.

## 2018-09-26 ENCOUNTER — Telehealth: Payer: Self-pay | Admitting: *Deleted

## 2018-09-26 NOTE — Telephone Encounter (Signed)
Contacted PCP, Dr. Raoul Pitch, and reviewed lung screening results. Faxed results to PCP, contacted patient and reviewed results of CT scan and likely follow up next steps. PCP will follow up with patient and advise me regarding [facility]. Patient knows to contact me if he has not spoken with he PCP's office within 1 day.   IMPRESSION: 1. Lung-RADS 4B, suspicious. Additional imaging evaluation or consultation with Pulmonology or Thoracic Surgery recommended. 2. Diffuse bronchial wall thickening with emphysema, as above; imaging findings suggestive of underlying COPD. 3. Coronary artery atherosclerotic calcifications.  Aortic Atherosclerosis (ICD10-I70.0) and Emphysema (ICD10-J43.9).

## 2018-10-01 ENCOUNTER — Encounter: Payer: Self-pay | Admitting: *Deleted

## 2018-10-01 DIAGNOSIS — R911 Solitary pulmonary nodule: Secondary | ICD-10-CM

## 2018-10-01 NOTE — Progress Notes (Signed)
  Oncology Nurse Navigator Documentation  Navigator Location: [facility]Med Onc (10/01/18 1100) Referral date to RadOnc/MedOnc: 10/01/18 (10/01/18 1100) )Navigator Encounter Type: Introductory phone call (10/01/18 1100)   Abnormal Finding Date: 09/21/18 (10/01/18 1100)                   Treatment Phase: Abnormal Scans (10/01/18 1100) Barriers/Navigation Needs: Coordination of Care (10/01/18 1100)   Interventions: Coordination of Care (10/01/18 1100)   Coordination of Care: Appts;Radiology (10/01/18 1100)        Acuity: Level 2 (10/01/18 1100)   Acuity Level 2: Initial guidance, education and coordination as needed;Educational needs;Assistance expediting appointments (10/01/18 1100)    phone call made to patient to introduce to navigator services and review upcoming appts. All questions answered at the time of visit. Pt is aware of appt scheduled for Dr. Grayland Ormond and Dr. Patsey Berthold in the lung clinic on Fri 6/5 at 9:30am. PET scan will be obtained after consult on Friday. Contact info given and instructed to call with any further questions or needs. Pt verbalized understanding.  Time Spent with Patient: 30 (10/01/18 1100)

## 2018-10-02 ENCOUNTER — Telehealth: Payer: Self-pay | Admitting: Oncology

## 2018-10-02 NOTE — Telephone Encounter (Signed)
Patient is a 78 year old male who had low dose CT chest for lung cancer screening on Sep 21, 2018 which noted a 2.13cm spiculated nodule in the left upper lobe. He has a extensive history of tobacco use (62 pack years). 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    Patient has been referred to the [facility] and will require a PET scan for further evaluation and to optimize management.  PET scan is also needed to assist in further diagnostic studies including possible biopsy.

## 2018-10-03 ENCOUNTER — Encounter: Payer: Self-pay | Admitting: Licensed Clinical Social Worker

## 2018-10-04 ENCOUNTER — Other Ambulatory Visit: Payer: Medicare Other

## 2018-10-05 ENCOUNTER — Inpatient Hospital Stay: Payer: No Typology Code available for payment source | Attending: Oncology | Admitting: Oncology

## 2018-10-05 ENCOUNTER — Encounter: Payer: Self-pay | Admitting: Oncology

## 2018-10-05 ENCOUNTER — Other Ambulatory Visit: Payer: Self-pay

## 2018-10-05 ENCOUNTER — Encounter: Payer: Self-pay | Admitting: *Deleted

## 2018-10-05 ENCOUNTER — Encounter: Payer: Self-pay | Admitting: Pulmonary Disease

## 2018-10-05 ENCOUNTER — Ambulatory Visit (INDEPENDENT_AMBULATORY_CARE_PROVIDER_SITE_OTHER): Payer: No Typology Code available for payment source | Admitting: Pulmonary Disease

## 2018-10-05 VITALS — BP 180/105 | HR 78 | Temp 98.7°F | Ht 72.0 in | Wt 253.0 lb

## 2018-10-05 DIAGNOSIS — R911 Solitary pulmonary nodule: Secondary | ICD-10-CM

## 2018-10-05 DIAGNOSIS — Z809 Family history of malignant neoplasm, unspecified: Secondary | ICD-10-CM | POA: Diagnosis not present

## 2018-10-05 DIAGNOSIS — F17210 Nicotine dependence, cigarettes, uncomplicated: Secondary | ICD-10-CM | POA: Insufficient documentation

## 2018-10-05 DIAGNOSIS — Z86006 Personal history of melanoma in-situ: Secondary | ICD-10-CM | POA: Diagnosis not present

## 2018-10-05 DIAGNOSIS — C3412 Malignant neoplasm of upper lobe, left bronchus or lung: Secondary | ICD-10-CM | POA: Diagnosis present

## 2018-10-05 DIAGNOSIS — R918 Other nonspecific abnormal finding of lung field: Secondary | ICD-10-CM | POA: Diagnosis not present

## 2018-10-05 DIAGNOSIS — J449 Chronic obstructive pulmonary disease, unspecified: Secondary | ICD-10-CM | POA: Insufficient documentation

## 2018-10-05 DIAGNOSIS — R0602 Shortness of breath: Secondary | ICD-10-CM | POA: Insufficient documentation

## 2018-10-05 NOTE — Patient Instructions (Signed)
1.  You will be hearing from my office with regards to your scan and your biopsy.

## 2018-10-05 NOTE — Progress Notes (Signed)
Patient is here today to establish care for a lung nodule. Patient stated that he stays SOB. Patient continues to smoke.

## 2018-10-05 NOTE — Progress Notes (Signed)
  Oncology Nurse Navigator Documentation  Navigator Location: [facility] (10/05/18 1000)   )Navigator Encounter Type: Clinic/MDC (10/05/18 1000)               Multidisiplinary Clinic Date: 10/05/18 (10/05/18 1000) Multidisiplinary Clinic Type: Thoracic (10/05/18 1000)   Patient Visit Type: MedOnc(Pulmonary) (10/05/18 1000) Treatment Phase: Abnormal Scans (10/05/18 1000) Barriers/Navigation Needs: Coordination of Care (10/05/18 1000)   Interventions: Coordination of Care (10/05/18 1000)   Coordination of Care: Appts;Broncoscopy (10/05/18 1000)             Met with patient during visit with Dr. Grayland Ormond and Dr. Patsey Berthold in the lung [place]. All questions answered during visit. Reviewed upcoming appts. Informed that we will be in touch with further appts. Contact info given and instructed to call with any further questions or needs. Pt verbalized understanding.     Time Spent with Patient: 60 (10/05/18 1000)

## 2018-10-05 NOTE — H&P (View-Only) (Signed)
Subjective:    Patient ID: Levi [name], male    DOB: [birth year], 78 y.o.   MRN: [id number]  HPI Patient is a 78 year old current smoker (4 cigarettes/day) who is seen in the [facility] he is referred by the [place] [facility] (associated with the [facility]) for evaluation of a left upper lobe mass.  The patient underwent lung cancer screening imaging on 21 Sep 2018 and a 2 cm nodule was noted on the left upper lobe.  There is also a small satellite nodule on the right upper lobe that measures 5 mm.  These films have been reviewed independently.  The patient is to have PET CT on 8 June.  The patient has had shortness of breath but is at baseline.  He notes that he has been having this of breath for several years.  He does takes only as needed albuterol and states that he really has not had any medical problems until the past year.  He has not had any wheezing.  He has a cough that mostly occurs in the mornings productive of whitish sputum.  This has not changed in character.  No hemoptysis.  No orthopnea or paroxysmal nocturnal dyspnea.  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  This was performed in [place].  His past medical history, surgical history, family history have been reviewed.  Social history: Currently smokes 1 to 4 cigarettes/day.  This has been over the past year.  Previously smoked 1 to 1-1/2 packs of cigarettes per day and has done so for approximately 60 years.  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    Review of Systems  Constitutional: Negative.   HENT: Negative.   Eyes: Negative.   Respiratory: Positive for cough, chest tightness and shortness of breath.   Cardiovascular: Negative.   Gastrointestinal: Negative.   Endocrine: Negative.   Genitourinary:       Prostatic hypertrophy  Musculoskeletal: Negative.   Skin:  Negative.   Hematological: Negative.   Psychiatric/Behavioral: Negative.   All other systems reviewed and are negative.      Objective:   Physical Exam Vitals signs and nursing note reviewed.  Constitutional:      Appearance: Normal appearance. He is not ill-appearing or toxic-appearing.  HENT:     Head: Normocephalic and atraumatic.     Right Ear: External ear normal.     Left Ear: External ear normal.     Nose:     Comments: Nose/mouth/throat not examined due to masking requirements for COVID-19.   Eyes:     General: No scleral icterus.    Conjunctiva/sclera: Conjunctivae normal.     Pupils: Pupils are equal, round, and reactive to light.  Neck:     Musculoskeletal: Neck supple.     Thyroid: No thyromegaly.     Vascular: No JVD.     Trachea: Trachea and phonation normal.  Cardiovascular:     Rate and Rhythm: Normal rate and regular rhythm.     Pulses: Normal pulses.     Heart sounds: Normal heart sounds. No murmur.  Pulmonary:     Effort: Pulmonary effort is normal. No respiratory distress.     Breath sounds: No wheezing, rhonchi or rales.     Comments: Diminished breath sounds throughout. Abdominal:     General: Abdomen is protuberant.  Musculoskeletal: Normal range of motion.  Right lower leg: No edema.     Left lower leg: No edema.  Lymphadenopathy:     Cervical: No cervical adenopathy.  Skin:    General: Skin is warm and dry.  Neurological:     General: No focal deficit present.     Mental Status: He is alert and oriented to person, place, and time.  Psychiatric:        Mood and Affect: Mood normal.        Behavior: Behavior normal.    The patient's films were independently reviewed.       Assessment & Plan:   1.  Left upper lobe lung mass, carcinoma until proven otherwise: Patient will need tissue diagnosis.  Recommend bronchoscopy with navigational assistance.  Benefits, limitations and potential complications of the procedure were discussed with  the patient/family  including, but not limited to bleeding, hemoptysis, respiratory failure requiring intubation and/or prolongued mechanical ventilation, infection, pneumothorax (collapse of lung) requiring chest tube placement, stroke from air embolism or even death.  Patient agrees to proceed.  He will need super dimension CT to be able to developed a map for the navigation program.  The patient also has a PET/CT ordered for 8 June.  If mediastinal adenopathy is noted endobronchial ultrasound will also be performed.  Date of procedure TBD.  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  3.  Hypertension: The patient has not been noted to be hypertensive on his clinic visits per his report.  He states that he is "nervous".  This may be related to anxiety of the visit today.  4.  Obesity: This issue adds complexity to his management.  5.  Tobacco dependence due to cigarettes: Patient was counseled regards to discontinuation of smoking.   Case discussed with Dr. Grayland Ormond.   This chart was dictated using voice recognition software/Dragon.  Despite best efforts to proofread, errors can occur which can change the meaning.  Any change was purely unintentional.

## 2018-10-05 NOTE — Progress Notes (Signed)
Subjective:    Patient ID: Levi [name], male    DOB: 21-Sep-1940, 78 y.o.   MRN: [id number]  HPI Patient is a 78 year old current smoker (4 cigarettes/day) who is seen in the [facility] he is referred by the [place] [facility] (associated with the [facility]) for evaluation of a left upper lobe mass.  The patient underwent lung cancer screening imaging on 21 Sep 2018 and a 2 cm nodule was noted on the left upper lobe.  There is also a small satellite nodule on the right upper lobe that measures 5 mm.  These films have been reviewed independently.  The patient is to have PET CT on 8 June.  The patient has had shortness of breath but is at baseline.  He notes that he has been having this of breath for several years.  He does takes only as needed albuterol and states that he really has not had any medical problems until the past year.  He has not had any wheezing.  He has a cough that mostly occurs in the mornings productive of whitish sputum.  This has not changed in character.  No hemoptysis.  No orthopnea or paroxysmal nocturnal dyspnea.  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  This was performed in [place].  His past medical history, surgical history, family history have been reviewed.  Social history: Currently smokes 1 to 4 cigarettes/day.  This has been over the past year.  Previously smoked 1 to 1-1/2 packs of cigarettes per day and has done so for approximately 60 years.  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    Review of Systems  Constitutional: Negative.   HENT: Negative.   Eyes: Negative.   Respiratory: Positive for cough, chest tightness and shortness of breath.   Cardiovascular: Negative.   Gastrointestinal: Negative.   Endocrine: Negative.   Genitourinary:       Prostatic hypertrophy  Musculoskeletal: Negative.   Skin:  Negative.   Hematological: Negative.   Psychiatric/Behavioral: Negative.   All other systems reviewed and are negative.      Objective:   Physical Exam Vitals signs and nursing note reviewed.  Constitutional:      Appearance: Normal appearance. He is not ill-appearing or toxic-appearing.  HENT:     Head: Normocephalic and atraumatic.     Right Ear: External ear normal.     Left Ear: External ear normal.     Nose:     Comments: Nose/mouth/throat not examined due to masking requirements for COVID-19.   Eyes:     General: No scleral icterus.    Conjunctiva/sclera: Conjunctivae normal.     Pupils: Pupils are equal, round, and reactive to light.  Neck:     Musculoskeletal: Neck supple.     Thyroid: No thyromegaly.     Vascular: No JVD.     Trachea: Trachea and phonation normal.  Cardiovascular:     Rate and Rhythm: Normal rate and regular rhythm.     Pulses: Normal pulses.     Heart sounds: Normal heart sounds. No murmur.  Pulmonary:     Effort: Pulmonary effort is normal. No respiratory distress.     Breath sounds: No wheezing, rhonchi or rales.     Comments: Diminished breath sounds throughout. Abdominal:     General: Abdomen is protuberant.  Musculoskeletal: Normal range of motion.  Right lower leg: No edema.     Left lower leg: No edema.  Lymphadenopathy:     Cervical: No cervical adenopathy.  Skin:    General: Skin is warm and dry.  Neurological:     General: No focal deficit present.     Mental Status: He is alert and oriented to person, place, and time.  Psychiatric:        Mood and Affect: Mood normal.        Behavior: Behavior normal.    The patient's films were independently reviewed.       Assessment & Plan:   1.  Left upper lobe lung mass, carcinoma until proven otherwise: Patient will need tissue diagnosis.  Recommend bronchoscopy with navigational assistance.  Benefits, limitations and potential complications of the procedure were discussed with  the patient/family  including, but not limited to bleeding, hemoptysis, respiratory failure requiring intubation and/or prolongued mechanical ventilation, infection, pneumothorax (collapse of lung) requiring chest tube placement, stroke from air embolism or even death.  Patient agrees to proceed.  He will need super dimension CT to be able to developed a map for the navigation program.  The patient also has a PET/CT ordered for 8 June.  If mediastinal adenopathy is noted endobronchial ultrasound will also be performed.  Date of procedure TBD.  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  3.  Hypertension: The patient has not been noted to be hypertensive on his clinic visits per his report.  He states that he is "nervous".  This may be related to anxiety of the visit today.  4.  Obesity: This issue adds complexity to his management.  5.  Tobacco dependence due to cigarettes: Patient was counseled regards to discontinuation of smoking.   Case discussed with Dr. Grayland Ormond.   This chart was dictated using voice recognition software/Dragon.  Despite best efforts to proofread, errors can occur which can change the meaning.  Any change was purely unintentional.

## 2018-10-05 NOTE — Progress Notes (Signed)
Tumor Board Documentation  Levi [name] was presented by Dr Grayland Ormond at our Tumor Board on 10/04/2018, which included representatives from medical oncology, radiation oncology, surgical, pathology, radiology, navigation, internal medicine, research.  Levi [name] currently presents as a new patient, for [facility], for discussion with history of the following treatments: active survellience.  Additionally, we reviewed previous medical and familial history, history of present illness, and recent lab results along with all available histopathologic and imaging studies. The tumor board considered available treatment options and made the following recommendations:   For Navigational Bronch, PFTs and surgery vs Radiation Therapy  The following procedures/referrals were also placed: No orders of the defined types were placed in this encounter.   Clinical Trial Status: not discussed   Staging used: Clinical Stage  AJCC Staging:       Group: Stage 1 Lung Cancer   National site-specific guidelines NCCN were discussed with respect to the case.  Tumor board is a meeting of clinicians from various specialty areas who evaluate and discuss patients for whom a multidisciplinary approach is being considered. Final determinations in the plan of care are those of the provider(s). The responsibility for follow up of recommendations given during tumor board is that of the provider.   Today's extended care, comprehensive team conference, Levi [name] was not present for the discussion and was not examined.   Multidisciplinary Tumor Board is a multidisciplinary case peer review process.  Decisions discussed in the Multidisciplinary Tumor Board reflect the opinions of the specialists present at the conference without having examined the patient.  Ultimately, treatment and diagnostic decisions rest with the primary provider(s) and the patient.

## 2018-10-06 NOTE — Progress Notes (Signed)
[place]  [phone] [phone]  ID: [name] OB: [birth year]  MR#: [id number]  QIO#:[id number]  Patient Care Team: System, Pcp Not In as PCP - General Telford Nab, RN as Registered Nurse  CHIEF COMPLAINT: Left upper lobe pulmonary nodule.  INTERVAL HISTORY: Patient is a 78 year old male who was recently found to have a suspicious left upper lobe pulmonary nodule on lung cancer screening CT.  He currently feels well and is asymptomatic.  He has no neurologic complaints.  He denies any recent fevers or illnesses.  He has a good appetite and denies weight loss.  He has no chest pain, shortness of breath, cough, or hemoptysis.  He denies any nausea, vomiting, constipation, or diarrhea.  He has no urinary complaints.  Patient feels at his baseline offers no specific complaints today.  REVIEW OF SYSTEMS:   Review of Systems  Constitutional: Negative.  Negative for fever, malaise/fatigue and weight loss.  Respiratory: Negative.  Negative for cough, hemoptysis and shortness of breath.   Cardiovascular: Negative.  Negative for chest pain and leg swelling.  Gastrointestinal: Negative.  Negative for abdominal pain.  Genitourinary: Negative.  Negative for dysuria.  Musculoskeletal: Negative.  Negative for back pain.  Skin: Negative.  Negative for rash.  Neurological: Negative.  Negative for dizziness, focal weakness, weakness and headaches.  Psychiatric/Behavioral: Negative.  The patient is not nervous/anxious.     As per HPI. Otherwise, a complete review of systems is negative.  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[place])    Thyroid disease    Tobacco abuse     PAST SURGICAL HISTORY: Past Surgical History:  Procedure Laterality Date   MELANOMA EXCISION Left     FAMILY HISTORY: Family History  Problem Relation Age of Onset   Aneurysm Mother    Cancer Father     ADVANCED  DIRECTIVES (Y/N):  N  HEALTH MAINTENANCE: Social History   Tobacco Use   Smoking status: Current Every Day Smoker    Packs/day: 1.00    Years: 62.00    Pack years: 62.00    Types: Cigarettes   Smokeless tobacco: Never Used  Substance Use Topics   Alcohol use: No    Alcohol/week: 0.0 standard drinks   Drug use: No     Colonoscopy:  PAP:  Bone density:  Lipid panel:  No Known Allergies  Current Outpatient Medications  Medication Sig Dispense Refill   albuterol (PROVENTIL HFA;VENTOLIN HFA) 108 (90 Base) MCG/ACT inhaler Inhale 2 puffs into the lungs every 6 (six) hours as needed for wheezing or shortness of breath. 1 Inhaler 0   aspirin 81 MG tablet Take 81 mg by mouth daily.     tamsulosin (FLOMAX) 0.4 MG CAPS capsule Take 0.8 mg by mouth at bedtime.     budesonide-formoterol (SYMBICORT) 160-4.5 MCG/ACT inhaler Inhale 2 puffs into the lungs 2 (two) times daily.     levothyroxine (SYNTHROID) 137 MCG tablet Take 137 mcg by mouth daily before breakfast.     No current facility-administered medications for this visit.     OBJECTIVE: Vitals:   10/05/18 0918 10/05/18 0930  BP: (!) 180/105   Pulse: 78   Temp: 98.7 F (37.1 C)   SpO2:  96%     Body mass index is 34.31 kg/m.    ECOG FS:0 - Asymptomatic  General: Well-developed, well-nourished, no acute distress. Eyes: Pink conjunctiva, anicteric sclera. HEENT: Normocephalic, moist mucous membranes, clear oropharnyx.  Lungs: Clear to auscultation bilaterally. Heart: Regular rate and rhythm. No rubs, murmurs, or gallops. Abdomen: Soft, nontender, nondistended. No organomegaly noted, normoactive bowel sounds. Musculoskeletal: No edema, cyanosis, or clubbing. Neuro: Alert, answering all questions appropriately. Cranial nerves grossly intact. Skin: No rashes or petechiae noted. Psych: Normal affect. Lymphatics: No cervical, calvicular, axillary or inguinal LAD.   LAB RESULTS:  Lab Results  Component Value Date    NA 139 10/31/2014   K 4.3 10/31/2014   CL 101 10/31/2014   CO2 27 10/31/2014   GLUCOSE 95 10/31/2014   BUN 11 10/31/2014   CREATININE 1.29 10/31/2014   CALCIUM 8.6 10/31/2014   PROT 6.5 10/31/2014   ALBUMIN 3.8 10/31/2014   AST 18 10/31/2014   ALT 16 10/31/2014   ALKPHOS 84 10/31/2014   BILITOT 0.6 10/31/2014    Lab Results  Component Value Date   WBC 9.1 10/20/2015   HGB 16.5 10/20/2015   HCT 46.9 10/20/2015   MCV 90.0 10/20/2015     STUDIES: Ct Chest Lung Cancer Screening Low Dose Wo Contrast  Result Date: 09/21/2018 CLINICAL DATA:  Lung cancer screening. Current asymptomatic smoker. 62 pack-year history. EXAM: CT CHEST WITHOUT CONTRAST LOW-DOSE FOR LUNG CANCER SCREENING TECHNIQUE: Multidetector CT imaging of the chest was performed following the standard protocol without IV contrast. COMPARISON:  None. FINDINGS: Cardiovascular: The heart size is normal. No pericardial effusion. Aortic atherosclerosis. Calcification in the RCA, LAD coronary arteries. Mediastinum/Nodes: The trachea appears patent and is midline. Normal appearance of the esophagus. No mediastinal or hilar lymph nodes. Lungs/Pleura: Moderate changes of emphysema. No airspace consolidation, atelectasis or pneumothorax. Large spiculated nodule in the left upper lobe has a mean derived diameter of 21.3 mm. Small adjacent satellite nodule measures 5.3 mm. Right upper lobe pulmonary nodule has an equivalent diameter of 4.9 mm. Upper Abdomen: No acute abnormality. Musculoskeletal: No chest wall mass or suspicious bone lesions identified. IMPRESSION: 1. Lung-RADS 4B, suspicious. Additional imaging evaluation or consultation with Pulmonology or Thoracic Surgery recommended. 2. Diffuse bronchial wall thickening with emphysema, as above; imaging findings suggestive of underlying COPD. 3. Coronary artery atherosclerotic calcifications. Aortic Atherosclerosis (ICD10-I70.0) and Emphysema (ICD10-J43.9). Electronically Signed   By:  Kerby Moors M.D.   On: 09/21/2018 16:47    ASSESSMENT:  Left upper lobe pulmonary nodule.  PLAN:    1.  Left upper lobe pulmonary nodule: CT scan results from Sep 21, 2018 reviewed independently and reported as above with highly suspicious left upper lobe pulmonary nodule.  Patient was also evaluated by pulmonology today.  Case was discussed with pulmonology as well as a tumor board.  He has a PET scan scheduled for Monday, October 08, 2018.  He would then undergo navigational bronchoscopy to obtain a diagnosis.  We will also get PFTs in preparation for surgery if biopsy is positive.  Patient will return to clinic in 1 week after his biopsy to discuss the results and treatment planning. 2.  History of melanoma: Unclear stage or depth, although by report was in situ.  Patient had Mohs surgery in fall 2019.  I spent a total of 60 minutes face-to-face with the patient of which greater than 50% of the visit was spent in counseling and coordination of care as detailed above.   Patient expressed understanding and was in agreement with this plan. He also understands that He can call clinic at any time with any questions, concerns, or complaints.   Cancer Staging No matching staging information was found for the patient.  Lloyd Huger, MD   10/06/2018 9:56 AM

## 2018-10-08 ENCOUNTER — Other Ambulatory Visit: Payer: Self-pay

## 2018-10-08 ENCOUNTER — Telehealth: Payer: Self-pay

## 2018-10-08 ENCOUNTER — Ambulatory Visit
Admission: RE | Admit: 2018-10-08 | Discharge: 2018-10-08 | Disposition: A | Discharge status: Home / Self Care | Payer: No Typology Code available for payment source | Source: Ambulatory Visit | Attending: Oncology | Admitting: Oncology

## 2018-10-08 DIAGNOSIS — R911 Solitary pulmonary nodule: Secondary | ICD-10-CM

## 2018-10-08 LAB — GLUCOSE, CAPILLARY: Glucose-Capillary: 113 mg/dL — ABNORMAL HIGH (ref 70–99)

## 2018-10-08 MED ORDER — FLUDEOXYGLUCOSE F - 18 (FDG) INJECTION
13.1000 | Freq: Once | INTRAVENOUS | Status: AC | PRN
  Administered 2018-10-08: 13.28 via INTRAVENOUS

## 2018-10-08 NOTE — Telephone Encounter (Signed)
Pt is aware of date/time and directions for bronch, CT and pre admit testing.

## 2018-10-08 NOTE — Telephone Encounter (Signed)
10/08/2018 at 4:04 pm EST spoke with Jeri Cos R at [facility]. Procedure codes 90903, [id number], and [phone] are all valid and billable codes which does not require PA. Call Ref # [id number]. Rhonda J Cobb

## 2018-10-08 NOTE — Telephone Encounter (Signed)
Left message for patient to call for Bronch and Super D details.   Bronchoscopy (ENB, EBUS) 10/19/18 @ 1300 CPT: 53912, 25834, 62194 DX: Left upper lobe mass, history of melanoma   Also Super D 10/16/18 @ 11:30 @ [facility]

## 2018-10-09 ENCOUNTER — Encounter
Admission: RE | Admit: 2018-10-09 | Discharge: 2018-10-09 | Disposition: A | Discharge status: Home / Self Care | Payer: No Typology Code available for payment source | Source: Ambulatory Visit | Attending: Pulmonary Disease | Admitting: Pulmonary Disease

## 2018-10-09 ENCOUNTER — Other Ambulatory Visit: Payer: Self-pay

## 2018-10-09 DIAGNOSIS — Z1159 Encounter for screening for other viral diseases: Secondary | ICD-10-CM | POA: Diagnosis not present

## 2018-10-09 DIAGNOSIS — Z01810 Encounter for preprocedural cardiovascular examination: Secondary | ICD-10-CM | POA: Diagnosis not present

## 2018-10-09 DIAGNOSIS — R9431 Abnormal electrocardiogram [ECG] [EKG]: Secondary | ICD-10-CM | POA: Diagnosis not present

## 2018-10-09 DIAGNOSIS — Z01818 Encounter for other preprocedural examination: Secondary | ICD-10-CM | POA: Diagnosis not present

## 2018-10-09 DIAGNOSIS — J449 Chronic obstructive pulmonary disease, unspecified: Secondary | ICD-10-CM | POA: Diagnosis not present

## 2018-10-09 HISTORY — DX: Other nonspecific abnormal finding of lung field: R91.8

## 2018-10-09 HISTORY — DX: Hypothyroidism, unspecified: E03.9

## 2018-10-09 HISTORY — DX: Chronic obstructive pulmonary disease, unspecified: J44.9

## 2018-10-09 HISTORY — DX: Benign prostatic hyperplasia without lower urinary tract symptoms: N40.0

## 2018-10-09 LAB — CBC
HCT: 50 % (ref 39.0–52.0)
Hemoglobin: 16.5 g/dL (ref 13.0–17.0)
MCH: 30.4 pg (ref 26.0–34.0)
MCHC: 33 g/dL (ref 30.0–36.0)
MCV: 92.3 fL (ref 80.0–100.0)
Platelets: 209 10*3/uL (ref 150–400)
RBC: 5.42 MIL/uL (ref 4.22–5.81)
RDW: 13.2 % (ref 11.5–15.5)
WBC: 7.6 10*3/uL (ref 4.0–10.5)
nRBC: 0 % (ref 0.0–0.2)

## 2018-10-09 LAB — BASIC METABOLIC PANEL
Anion gap: 10 (ref 5–15)
BUN: 21 mg/dL (ref 8–23)
CO2: 22 mmol/L (ref 22–32)
Calcium: 9 mg/dL (ref 8.9–10.3)
Chloride: 106 mmol/L (ref 98–111)
Creatinine, Ser: 1.41 mg/dL — ABNORMAL HIGH (ref 0.61–1.24)
GFR calc Af Amer: 56 mL/min — ABNORMAL LOW (ref >60)
GFR calc non Af Amer: 48 mL/min — ABNORMAL LOW (ref >60)
Glucose, Bld: 102 mg/dL — ABNORMAL HIGH (ref 70–99)
Potassium: 4.1 mmol/L (ref 3.5–5.1)
Sodium: 138 mmol/L (ref 135–145)

## 2018-10-09 NOTE — Patient Instructions (Addendum)
Your procedure is scheduled on: 10/19/2018 Fri Report to Same Day Surgery 2nd floor medical mall [facility] Entrance-take elevator on left to 2nd floor.  Check in with surgery information desk.) To find out your arrival time please call [phone] between 1PM - 3PM on 10/18/2018 Thurs  Remember: Instructions that are not followed completely may result in serious medical risk, up to and including death, or upon the discretion of your surgeon and anesthesiologist your surgery may need to be rescheduled.    _x___ 1. Do not eat food after midnight the night before your procedure. You may drink clear liquids up to 2 hours before you are scheduled to arrive at the hospital for your procedure.  Do not drink clear liquids within 2 hours of your scheduled arrival to the hospital.  Clear liquids include  --Water or Apple juice without pulp  --Clear carbohydrate beverage such as ClearFast or Gatorade  --Black Coffee or Clear Tea (No milk, no creamers, do not add anything to                  the coffee or Tea Type 1 and type 2 diabetics should only drink water.   ____Ensure clear carbohydrate drink on the way to the hospital for bariatric patients  ____Ensure clear carbohydrate drink 3 hours before surgery for Dr Dwyane Luo patients if physician instructed.   No gum chewing or hard candies.     __x__ 2. No Alcohol for 24 hours before or after surgery.   __x__3. No Smoking or e-cigarettes for 24 prior to surgery.  Do not use any chewable tobacco products for at least 6 hour prior to surgery   ____  4. Bring all medications with you on the day of surgery if instructed.    __x__ 5. Notify your doctor if there is any change in your medical condition     (cold, fever, infections).    x___6. On the morning of surgery brush your teeth with toothpaste and water.  You may rinse your mouth with mouth wash if you wish.  Do not swallow any toothpaste or mouthwash.   Do not wear jewelry, make-up,  hairpins, clips or nail polish.  Do not wear lotions, powders, or perfumes. You may wear deodorant.  Do not shave 48 hours prior to surgery. Men may shave face and neck.  Do not bring valuables to the hospital.    [facility] is not responsible for any belongings or valuables.               Contacts, dentures or bridgework may not be worn into surgery.  Leave your suitcase in the car. After surgery it may be brought to your room.  For patients admitted to the hospital, discharge time is determined by your                       treatment team.  _  Patients discharged the day of surgery will not be allowed to drive home.  You will need someone to drive you home and stay with you the night of your procedure.    Please read over the following fact sheets that you were given:   [facility] Preparing for Surgery and or MRSA Information   _x___ Take anti-hypertensive listed below, cardiac, seizure, asthma,     anti-reflux and psychiatric medicines. These include:  1. budesonide-formoterol (SYMBICORT) 160-4.5 MCG/ACT inhaler  2.albuterol (PROVENTIL   3.levothyroxine (SYNTHROID) 137 MCG   4.  5.  6.  ____Fleets enema or Magnesium Citrate as directed.   ____ Use CHG Soap or sage wipes as directed on instruction sheet   __x__ Use inhalers on the day of surgery and bring to hospital day of surgery  ____ Stop Metformin and Janumet 2 days prior to surgery.    ____ Take 1/2 of usual insulin dose the night before surgery and none on the morning     surgery.   _x___ Follow recommendations from Cardiologist, Pulmonologist or PCP regarding          stopping Aspirin, Coumadin, Plavix ,Eliquis, Effient, or Pradaxa, and Pletal.  X____Stop Anti-inflammatories such as Advil, Aleve, Ibuprofen, Motrin, Naproxen, Naprosyn, Goodies powders or aspirin products. OK to take Tylenol and                          Celebrex.   _x___ Stop supplements until after surgery.  But may continue Vitamin D, Vitamin B,        and multivitamin.   ____ Bring C-Pap to the hospital.

## 2018-10-11 ENCOUNTER — Other Ambulatory Visit: Payer: Self-pay

## 2018-10-11 ENCOUNTER — Other Ambulatory Visit
Admission: RE | Admit: 2018-10-11 | Discharge: 2018-10-11 | Disposition: A | Discharge status: Home / Self Care | Payer: No Typology Code available for payment source | Source: Ambulatory Visit | Attending: Pulmonary Disease | Admitting: Pulmonary Disease

## 2018-10-11 DIAGNOSIS — Z01818 Encounter for other preprocedural examination: Secondary | ICD-10-CM | POA: Diagnosis not present

## 2018-10-12 LAB — NOVEL CORONAVIRUS, NAA (HOSP ORDER, SEND-OUT TO REF LAB; TAT 18-24 HRS): SARS-CoV-2, NAA: NOT DETECTED

## 2018-10-12 NOTE — Pre-Procedure Instructions (Signed)
EKG OK BY DR Ola Spurr 10/11/18

## 2018-10-15 ENCOUNTER — Ambulatory Visit: Payer: No Typology Code available for payment source | Attending: Oncology

## 2018-10-15 ENCOUNTER — Other Ambulatory Visit: Payer: Self-pay

## 2018-10-15 DIAGNOSIS — R911 Solitary pulmonary nodule: Secondary | ICD-10-CM | POA: Diagnosis not present

## 2018-10-15 DIAGNOSIS — J988 Other specified respiratory disorders: Secondary | ICD-10-CM | POA: Diagnosis not present

## 2018-10-15 LAB — BLOOD GAS, ARTERIAL
Acid-base deficit: 1.3 mmol/L (ref 0.0–2.0)
Bicarbonate: 22.7 mmol/L (ref 20.0–28.0)
FIO2: 0.21
O2 Saturation: 93.1 %
Patient temperature: 37
pCO2 arterial: 35 mmHg (ref 32.0–48.0)
pH, Arterial: 7.42 (ref 7.350–7.450)
pO2, Arterial: 66 mmHg — ABNORMAL LOW (ref 83.0–108.0)

## 2018-10-15 MED ORDER — ALBUTEROL SULFATE (2.5 MG/3ML) 0.083% IN NEBU
2.5000 mg | INHALATION_SOLUTION | Freq: Once | RESPIRATORY_TRACT | Status: AC
  Administered 2018-10-15: 2.5 mg via RESPIRATORY_TRACT
  Filled 2018-10-15: qty 3

## 2018-10-16 ENCOUNTER — Other Ambulatory Visit
Admission: RE | Admit: 2018-10-16 | Discharge: 2018-10-16 | Disposition: A | Discharge status: Home / Self Care | Payer: No Typology Code available for payment source | Source: Ambulatory Visit | Attending: Pulmonary Disease | Admitting: Pulmonary Disease

## 2018-10-16 ENCOUNTER — Other Ambulatory Visit: Payer: Non-veteran care

## 2018-10-16 ENCOUNTER — Ambulatory Visit
Admission: RE | Admit: 2018-10-16 | Discharge: 2018-10-16 | Disposition: A | Discharge status: Home / Self Care | Payer: No Typology Code available for payment source | Source: Ambulatory Visit | Attending: Pulmonary Disease | Admitting: Pulmonary Disease

## 2018-10-16 DIAGNOSIS — I700 Atherosclerosis of aorta: Secondary | ICD-10-CM | POA: Insufficient documentation

## 2018-10-16 DIAGNOSIS — R918 Other nonspecific abnormal finding of lung field: Secondary | ICD-10-CM | POA: Insufficient documentation

## 2018-10-16 DIAGNOSIS — J439 Emphysema, unspecified: Secondary | ICD-10-CM | POA: Diagnosis not present

## 2018-10-16 DIAGNOSIS — Z87891 Personal history of nicotine dependence: Secondary | ICD-10-CM | POA: Insufficient documentation

## 2018-10-16 DIAGNOSIS — Z1159 Encounter for screening for other viral diseases: Secondary | ICD-10-CM | POA: Insufficient documentation

## 2018-10-17 LAB — NOVEL CORONAVIRUS, NAA (HOSP ORDER, SEND-OUT TO REF LAB; TAT 18-24 HRS): SARS-CoV-2, NAA: NOT DETECTED

## 2018-10-19 ENCOUNTER — Encounter
Admission: RE | Disposition: A | Discharge status: Home / Self Care | Payer: Self-pay | Source: Ambulatory Visit | Attending: Pulmonary Disease

## 2018-10-19 ENCOUNTER — Ambulatory Visit: Payer: No Typology Code available for payment source

## 2018-10-19 ENCOUNTER — Ambulatory Visit
Admission: RE | Admit: 2018-10-19 | Discharge: 2018-10-19 | Disposition: A | Discharge status: Home / Self Care | Payer: No Typology Code available for payment source | Source: Ambulatory Visit | Attending: Pulmonary Disease | Admitting: Pulmonary Disease

## 2018-10-19 ENCOUNTER — Encounter: Payer: Self-pay | Admitting: *Deleted

## 2018-10-19 ENCOUNTER — Ambulatory Visit: Payer: No Typology Code available for payment source | Admitting: Anesthesiology

## 2018-10-19 ENCOUNTER — Other Ambulatory Visit: Payer: Self-pay

## 2018-10-19 DIAGNOSIS — I10 Essential (primary) hypertension: Secondary | ICD-10-CM | POA: Diagnosis not present

## 2018-10-19 DIAGNOSIS — E039 Hypothyroidism, unspecified: Secondary | ICD-10-CM | POA: Diagnosis not present

## 2018-10-19 DIAGNOSIS — E669 Obesity, unspecified: Secondary | ICD-10-CM | POA: Insufficient documentation

## 2018-10-19 DIAGNOSIS — C3412 Malignant neoplasm of upper lobe, left bronchus or lung: Secondary | ICD-10-CM | POA: Insufficient documentation

## 2018-10-19 DIAGNOSIS — R918 Other nonspecific abnormal finding of lung field: Secondary | ICD-10-CM | POA: Diagnosis not present

## 2018-10-19 DIAGNOSIS — Z85820 Personal history of malignant melanoma of skin: Secondary | ICD-10-CM | POA: Diagnosis not present

## 2018-10-19 DIAGNOSIS — Z6834 Body mass index (BMI) 34.0-34.9, adult: Secondary | ICD-10-CM | POA: Diagnosis not present

## 2018-10-19 DIAGNOSIS — Z79899 Other long term (current) drug therapy: Secondary | ICD-10-CM | POA: Diagnosis not present

## 2018-10-19 DIAGNOSIS — Z7982 Long term (current) use of aspirin: Secondary | ICD-10-CM | POA: Diagnosis not present

## 2018-10-19 DIAGNOSIS — N400 Enlarged prostate without lower urinary tract symptoms: Secondary | ICD-10-CM | POA: Diagnosis not present

## 2018-10-19 DIAGNOSIS — J449 Chronic obstructive pulmonary disease, unspecified: Secondary | ICD-10-CM | POA: Insufficient documentation

## 2018-10-19 DIAGNOSIS — F17210 Nicotine dependence, cigarettes, uncomplicated: Secondary | ICD-10-CM | POA: Diagnosis not present

## 2018-10-19 DIAGNOSIS — R911 Solitary pulmonary nodule: Secondary | ICD-10-CM | POA: Diagnosis not present

## 2018-10-19 DIAGNOSIS — Z79890 Hormone replacement therapy (postmenopausal): Secondary | ICD-10-CM | POA: Insufficient documentation

## 2018-10-19 DIAGNOSIS — Z9889 Other specified postprocedural states: Secondary | ICD-10-CM

## 2018-10-19 HISTORY — PX: ENDOBRONCHIAL ULTRASOUND: SHX5096

## 2018-10-19 HISTORY — PX: ELECTROMAGNETIC NAVIGATION BROCHOSCOPY: SHX5369

## 2018-10-19 SURGERY — ENDOBRONCHIAL ULTRASOUND (EBUS)
Anesthesia: General | Laterality: Left

## 2018-10-19 MED ORDER — FENTANYL CITRATE (PF) 100 MCG/2ML IJ SOLN
INTRAMUSCULAR | Status: DC | PRN
  Administered 2018-10-19 (×2): 50 ug via INTRAVENOUS

## 2018-10-19 MED ORDER — FENTANYL CITRATE (PF) 100 MCG/2ML IJ SOLN
INTRAMUSCULAR | Status: AC
  Filled 2018-10-19: qty 2

## 2018-10-19 MED ORDER — LACTATED RINGERS IV SOLN
INTRAVENOUS | Status: DC
  Administered 2018-10-19 (×2): via INTRAVENOUS

## 2018-10-19 MED ORDER — PHENYLEPHRINE HCL (PRESSORS) 10 MG/ML IV SOLN
INTRAVENOUS | Status: AC
  Filled 2018-10-19: qty 1

## 2018-10-19 MED ORDER — FENTANYL CITRATE (PF) 100 MCG/2ML IJ SOLN: 25.0000 ug | INTRAMUSCULAR | Status: DC | PRN

## 2018-10-19 MED ORDER — LIDOCAINE HCL (CARDIAC) PF 100 MG/5ML IV SOSY
PREFILLED_SYRINGE | INTRAVENOUS | Status: DC | PRN
  Administered 2018-10-19: 80 mg via INTRAVENOUS

## 2018-10-19 MED ORDER — SUCCINYLCHOLINE CHLORIDE 20 MG/ML IJ SOLN
INTRAMUSCULAR | Status: DC | PRN
  Administered 2018-10-19: 200 mg via INTRAVENOUS

## 2018-10-19 MED ORDER — IPRATROPIUM-ALBUTEROL 0.5-2.5 (3) MG/3ML IN SOLN
RESPIRATORY_TRACT | Status: AC
  Filled 2018-10-19: qty 3

## 2018-10-19 MED ORDER — LIDOCAINE HCL (PF) 2 % IJ SOLN
INTRAMUSCULAR | Status: AC
  Filled 2018-10-19: qty 10

## 2018-10-19 MED ORDER — FAMOTIDINE 20 MG PO TABS
20.0000 mg | ORAL_TABLET | Freq: Once | ORAL | Status: AC
  Administered 2018-10-19: 12:00:00 20 mg via ORAL

## 2018-10-19 MED ORDER — PROPOFOL 500 MG/50ML IV EMUL
INTRAVENOUS | Status: DC | PRN
  Administered 2018-10-19: 80 ug/kg/min via INTRAVENOUS

## 2018-10-19 MED ORDER — BUTAMBEN-TETRACAINE-BENZOCAINE 2-2-14 % EX AERO
1.0000 | INHALATION_SPRAY | Freq: Once | CUTANEOUS | Status: DC
  Filled 2018-10-19: qty 20

## 2018-10-19 MED ORDER — ROCURONIUM BROMIDE 50 MG/5ML IV SOLN
INTRAVENOUS | Status: AC
  Filled 2018-10-19: qty 1

## 2018-10-19 MED ORDER — PHENYLEPHRINE HCL (PRESSORS) 10 MG/ML IV SOLN
INTRAVENOUS | Status: DC | PRN
  Administered 2018-10-19: 150 ug via INTRAVENOUS
  Administered 2018-10-19: 200 ug via INTRAVENOUS
  Administered 2018-10-19: 150 ug via INTRAVENOUS
  Administered 2018-10-19: 200 ug via INTRAVENOUS
  Administered 2018-10-19: 150 ug via INTRAVENOUS
  Administered 2018-10-19: 100 ug via INTRAVENOUS
  Administered 2018-10-19: 200 ug via INTRAVENOUS

## 2018-10-19 MED ORDER — SUGAMMADEX SODIUM 200 MG/2ML IV SOLN
INTRAVENOUS | Status: DC | PRN
  Administered 2018-10-19: 200 mg via INTRAVENOUS

## 2018-10-19 MED ORDER — PROPOFOL 10 MG/ML IV BOLUS
INTRAVENOUS | Status: AC
  Filled 2018-10-19: qty 40

## 2018-10-19 MED ORDER — PROPOFOL 10 MG/ML IV BOLUS
INTRAVENOUS | Status: DC | PRN
  Administered 2018-10-19: 150 mg via INTRAVENOUS
  Administered 2018-10-19: 50 mg via INTRAVENOUS

## 2018-10-19 MED ORDER — OXYCODONE HCL 5 MG/5ML PO SOLN: 5.0000 mg | Freq: Once | ORAL | Status: DC | PRN

## 2018-10-19 MED ORDER — FAMOTIDINE 20 MG PO TABS
ORAL_TABLET | ORAL | Status: AC
  Filled 2018-10-19: qty 1

## 2018-10-19 MED ORDER — ONDANSETRON HCL 4 MG/2ML IJ SOLN
INTRAMUSCULAR | Status: AC
  Filled 2018-10-19: qty 2

## 2018-10-19 MED ORDER — OXYCODONE HCL 5 MG PO TABS: 5.0000 mg | ORAL_TABLET | Freq: Once | ORAL | Status: DC | PRN

## 2018-10-19 MED ORDER — PROPOFOL 10 MG/ML IV BOLUS
INTRAVENOUS | Status: AC
  Filled 2018-10-19: qty 20

## 2018-10-19 MED ORDER — IPRATROPIUM-ALBUTEROL 0.5-2.5 (3) MG/3ML IN SOLN
3.0000 mL | Freq: Once | RESPIRATORY_TRACT | Status: AC
  Administered 2018-10-19: 3 mL via RESPIRATORY_TRACT

## 2018-10-19 MED ORDER — ONDANSETRON HCL 4 MG/2ML IJ SOLN
INTRAMUSCULAR | Status: DC | PRN
  Administered 2018-10-19: 4 mg via INTRAVENOUS

## 2018-10-19 MED ORDER — ROCURONIUM BROMIDE 100 MG/10ML IV SOLN
INTRAVENOUS | Status: DC | PRN
  Administered 2018-10-19: 30 mg via INTRAVENOUS
  Administered 2018-10-19: 20 mg via INTRAVENOUS

## 2018-10-19 NOTE — Interval H&P Note (Signed)
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  The various methods of treatment have been discussed with the patient and family. After consideration of risks, benefits and other options for treatment, the patient has consented to  Procedure(s): ENDOBRONCHIAL ULTRASOUND LEFT (Left) ELECTROMAGNETIC NAVIGATION BRONCHOSCOPY LEFT (Left) as a surgical intervention.  The patient's history has been reviewed, patient examined, no change in status, stable for surgery.  I have reviewed the patient's chart and labs.  Questions were answered to the patient's satisfaction.     Vernard Gambles

## 2018-10-19 NOTE — Anesthesia Procedure Notes (Signed)
Procedure Name: Intubation Date/Time: 10/19/2018 1:43 PM Performed by: Lowry Bowl, CRNA Pre-anesthesia Checklist: Patient identified, Emergency Drugs available, Suction available and Patient being monitored Patient Re-evaluated:Patient Re-evaluated prior to induction Oxygen Delivery Method: Circle system utilized Preoxygenation: Pre-oxygenation with 100% oxygen Induction Type: IV induction, Cricoid Pressure applied and Rapid sequence Ventilation: Mask ventilation without difficulty Laryngoscope Size: Mac and 4 Grade View: Grade II Tube type: Oral Tube size: 8.5 mm Number of attempts: 1 Airway Equipment and Method: Stylet Placement Confirmation: ETT inserted through vocal cords under direct vision,  positive ETCO2 and breath sounds checked- equal and bilateral Secured at: 21 cm Tube secured with: Tape Dental Injury: Teeth and Oropharynx as per pre-operative assessment

## 2018-10-19 NOTE — Transfer of Care (Signed)
Immediate Anesthesia Transfer of Care Note  Patient: Levi [name]  Procedure(s) Performed: ENDOBRONCHIAL ULTRASOUND LEFT (Left ) ELECTROMAGNETIC NAVIGATION BRONCHOSCOPY LEFT (Left )  Patient Location: PACU  Anesthesia Type:General  Level of Consciousness: awake, oriented and patient cooperative  Airway & Oxygen Therapy: Patient Spontanous Breathing and Patient connected to face mask oxygen  Post-op Assessment: Report given to RN, Post -op Vital signs reviewed and stable and Patient moving all extremities  Post vital signs: Reviewed and stable  Last Vitals:  Vitals Value Taken Time  BP 150/80 10/19/18 1510  Temp    Pulse 81 10/19/18 1515  Resp 22 10/19/18 1515  SpO2 95 % 10/19/18 1515  Vitals shown include unvalidated device data.  Last Pain:  Vitals:   10/19/18 1509  TempSrc:   PainSc: 0-No pain         Complications: No apparent anesthesia complications

## 2018-10-19 NOTE — Anesthesia Preprocedure Evaluation (Signed)
Anesthesia Evaluation  Patient identified by MRN, date of birth, ID band Patient awake    Reviewed: Allergy & Precautions, H&P , NPO status , Patient's Chart, lab work & pertinent test results  History of Anesthesia Complications Negative for: history of anesthetic complications  Airway Mallampati: II  TM Distance: >3 FB Neck ROM: limited    Dental  (+) Poor Dentition, Missing   Pulmonary COPD,  COPD inhaler, Current Smoker,           Cardiovascular Exercise Tolerance: Good (-) angina(-) Past MI and (-) DOE      Neuro/Psych negative neurological ROS  negative psych ROS   GI/Hepatic negative GI ROS, Neg liver ROS, neg GERD  ,  Endo/Other  Hypothyroidism   Renal/GU      Musculoskeletal   Abdominal   Peds  Hematology negative hematology ROS (+)   Anesthesia Other Findings Past Medical History: No date: COPD (chronic obstructive pulmonary disease) (HCC) No date: Hypothyroidism No date: Mass of left lung No date: 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[place]) No date: Prostate enlargement No date: Thyroid disease No date: Tobacco abuse  Past Surgical History: No date: MELANOMA EXCISION; Left No date: NO PAST SURGERIES  BMI    Body Mass Index: 34.31 kg/m      Reproductive/Obstetrics negative OB ROS                             Anesthesia Physical Anesthesia Plan  ASA: III  Anesthesia Plan: General ETT   Post-op Pain Management:    Induction: Intravenous  PONV Risk Score and Plan: Ondansetron, Dexamethasone, Midazolam and Treatment may vary due to age or medical condition  Airway Management Planned: Oral ETT  Additional Equipment:   Intra-op Plan:   Post-operative Plan: Extubation in OR  Informed Consent: I have reviewed the patients History and Physical, chart, labs and discussed the procedure including the risks, benefits and alternatives for the proposed anesthesia with the patient  or authorized representative who has indicated his/her understanding and acceptance.     Dental Advisory Given  Plan Discussed with: Anesthesiologist, CRNA and Surgeon  Anesthesia Plan Comments: (Patient consented for risks of anesthesia including but not limited to:  - adverse reactions to medications - damage to teeth, lips or other oral mucosa - sore throat or hoarseness - Damage to heart, brain, lungs or loss of life  Patient voiced understanding.)        Anesthesia Quick Evaluation

## 2018-10-19 NOTE — Anesthesia Post-op Follow-up Note (Signed)
Anesthesia QCDR form completed.        

## 2018-10-19 NOTE — Op Note (Signed)
1. Electromagnetic Navigation Bronchoscopy 2. Confocal laser endomicroscopy  Indication: 78 year old current smoker with LEFT upper lobe mass (2 cm) and RIGHT upper lobe nodule (5 mm).  Incidentally found on lung cancer screening imaging on 21 Sep 2018.  No mediastinal adenopathy noted on follow-up CT.   Preoperative Diagnosis:LEFT upper lobe mass                                        RIGHT upper lobe nodule  Post Procedure Diagnosis: Same as above  Consent: Verbal/Written.  Benefits, limitations and potential complications of the procedure were discussed with the patient/family  including, but not limited to bleeding, hemoptysis, respiratory failure requiring intubation and/or prolongued mechanical ventilation, infection, pneumothorax (collapse of lung) requiring chest tube placement, stroke from air embolism or even death.  The patient/family understand the potential benefits, limitations and complications of the procedure and agree  to go ahead.    Hand washing performed prior to starting the procedure.   Type of Anesthesia: General anesthesia.  Procedure Performed:  1) Virtual Bronchoscopy with Multi-planar Image analysis, 3-D reconstruction of coronal, sagittal and multi-planar images for the purposes of planning real-time bronchoscopy using the iLogic Electromagnetic Navigation Bronchoscopy System (superDimension). 2) Confocal laser endo-microscopy (Cellvizio).  Description of Procedure: Patient was taken to procedure room 2 where appropriate timeout was taken with the staff.  Patient was inducted under general anesthesia by CRNA/anesthesiologist.  A Portex adapter was placed on the endotracheal tube flange.  Once the patient was adequately anesthetized, the Olympus video therapeutic bronchoscope was advanced through the Portex adapter.  Examination of the entire tracheobronchial tree was then performed.  Visible portions of the trachea were normal.  Carina was sharp.  There were copious  secretions throughout but no overt inflammatory changes.  Secretions were lavaged till clear.  Right upper lobe, right middle lobe and right lower lobe subsegments did not show any endobronchial lesions.  No abnormalities.  Bronchoscope was then brought to the left and in similar fashion airways were inspected left upper lobe, lingula and left lower lobes were inspected, no endobronchial lesions were noted.  Copious secretions as noted previously.  These were lavaged till clear.  No inflammatory changes.  At this point a super dimension extended working channel and locatable guide (LG) were then placed into the central portion of the trachea. The LG was directed to standard registration points at the following centers: main carina, right upper lobe bronchus, right lower lobe bronchus, right middle lobe bronchus, left upper lobe bronchus, and the left lower lobe bronchus. This data was transferred to the i-Logic ENB system for real-time bronchoscopy.  After correlation with the CT imaging and the actual bronchoscopy image was reached the scope was then navigated to the left upper lobe to the target previously determined during mapping (2 cm mass).  The target was reached and fluoroscopy was used to verify the position of the locatable guide.  Once this was done the locatable guide was removed and the Cellvizio endo-microscopy probe was then introduced.  Abnormal tissue could be seen by the Cellvizio probe and brushings were obtained of this area.  ROSE determine "atypical cells".  At this point, the extended working channel was redirected utilizing the Cellvizio probe as a guide.  The Cellvizio probe was advanced until a denser focus of abnormal tissue could be seen.  At this point the extended working channel sheath was advanced  via Seldinger technique over the Cellvizio probe.  Rose examination of brushings performed in this area again showed atypical cells.  The extended working channel was then redirected  utilizing the LG and confirming with Cellvizio.  On this third readjustment, ROSE showed malignant cells on the brushings.  Biopsies were then obtained x 10.  Additional brushings were performed for a total of 6 brushings.  Once this was completed targeted bronchoalveolar lavage was performed of this area with 40 mL's of saline instilled yielding 8 mL's of aliquot. Once this was completed, the locatable guide was then placed in the extended working channel and the target was switched to the right upper lobe nodule.  Navigation was performed until the target was acquired.  Cellvizio endo-microscopy was performed however, note that this is a very small nodule 5 mm.  Ill characterized abnormal tissue could be seen by Cellvizio probably representing inflammatory change.  Brushings x2 were performed here.  At this point the extended working channel was retrieved.  The airway was inspected and good hemostasis was noted.  The patient received a total of 9 mL's of lidocaine 1% via bronchial lavage and the bronchoscope was then removed.  The procedure was terminated.  At this point the patient was allowed to emerge from general anesthesia and was extubated in the procedure room without sequela.  He was taken to the PACU in satisfactory condition.  The patient tolerated the procedure well.  Specimens Obtained:  Transbronchial brushings, fluoroscopy guided: X6 left upper lobe and x2 right upper lobe  Transbronchial Forceps Biopsy x10 left upper lobe mass  Bronchoalveolar lavage: Targeted to left upper lobe, 40 mL 7, 8 mL's aliquot.  Fluoroscopy:  Fluoroscopy was utilized during the course of this procedure to assure that biopsies were taken in a safe manner under fluoroscopic guidance with spot films as required.   Complications:None, postprocedure chest x-ray shows no pneumothorax.  Estimated Blood Loss: < 5 ml   Assessment and Plan: 1.  Left upper lobe 2 cm mass, carcinoma until proven otherwise 2.  Right  upper lobe satellite nodule, 5 mm, uncertain process.  Follow up Pathology Reports    C. Derrill Kay, MD [facility]

## 2018-10-19 NOTE — Anesthesia Postprocedure Evaluation (Signed)
Anesthesia Post Note  Patient: Levi [name]  Procedure(s) Performed: ENDOBRONCHIAL ULTRASOUND LEFT (Left ) ELECTROMAGNETIC NAVIGATION BRONCHOSCOPY LEFT (Left )  Patient location during evaluation: PACU Anesthesia Type: General Level of consciousness: awake and alert Pain management: pain level controlled Vital Signs Assessment: post-procedure vital signs reviewed and stable Respiratory status: spontaneous breathing and respiratory function stable Cardiovascular status: stable Anesthetic complications: no     Last Vitals:  Vitals:   10/19/18 1224 10/19/18 1509  BP: (!) 179/95 (!) 150/80  Pulse:  81  Resp:  16  Temp:    SpO2:  95%    Last Pain:  Vitals:   10/19/18 1509  TempSrc:   PainSc: 0-No pain                 KEPHART,WILLIAM K

## 2018-10-20 DIAGNOSIS — R911 Solitary pulmonary nodule: Secondary | ICD-10-CM | POA: Insufficient documentation

## 2018-10-20 NOTE — Progress Notes (Signed)
[place]  Telephone:(336) [phone] Fax:(336) [phone]  ID: Levi [name] OB: Mar 21, 1941  MR#: [id number]  GXQ#:[id number]  Patient Care Team: System, Pcp Not In as PCP - General Telford Nab, RN as Registered Nurse  CHIEF COMPLAINT: Clinical stage IA3 squamous cell carcinoma, left upper lobe lung.  INTERVAL HISTORY: Patient returns to clinic today for further evaluation and discussion of his imaging and pathology results.  He is anxious, but otherwise feels well. He has no neurologic complaints.  He denies any recent fevers or illnesses.  He has a good appetite and denies weight loss.  He has no chest pain, shortness of breath, cough, or hemoptysis.  He denies any nausea, vomiting, constipation, or diarrhea.  He has no urinary complaints.  Patient offers no further specific complaints today.  REVIEW OF SYSTEMS:   Review of Systems  Constitutional: Negative.  Negative for fever, malaise/fatigue and weight loss.  Respiratory: Negative.  Negative for cough, hemoptysis and shortness of breath.   Cardiovascular: Negative.  Negative for chest pain and leg swelling.  Gastrointestinal: Negative.  Negative for abdominal pain.  Genitourinary: Negative.  Negative for dysuria.  Musculoskeletal: Negative.  Negative for back pain.  Skin: Negative.  Negative for rash.  Neurological: Negative.  Negative for dizziness, focal weakness, weakness and headaches.  Psychiatric/Behavioral: The patient is nervous/anxious.     As per HPI. Otherwise, a complete review of systems is negative.  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[place])    Prostate enlargement    Thyroid disease    Tobacco abuse     PAST SURGICAL HISTORY: Past Surgical History:  Procedure Laterality Date   ELECTROMAGNETIC NAVIGATION BROCHOSCOPY Left 10/19/2018    Procedure: ELECTROMAGNETIC NAVIGATION BRONCHOSCOPY LEFT;  Surgeon: Tyler Pita, MD;  Location: [facility] ORS;  Service: Cardiopulmonary;  Laterality: Left;   ENDOBRONCHIAL ULTRASOUND Left 10/19/2018   Procedure: ENDOBRONCHIAL ULTRASOUND LEFT;  Surgeon: Tyler Pita, MD;  Location: [facility] ORS;  Service: Cardiopulmonary;  Laterality: Left;   MELANOMA EXCISION Left    NO PAST SURGERIES      FAMILY HISTORY: Family History  Problem Relation Age of Onset   Aneurysm Mother    Cancer Father     ADVANCED DIRECTIVES (Y/N):  N  HEALTH MAINTENANCE: Social History   Tobacco Use   Smoking status: Current Every Day Smoker    Packs/day: 0.25    Years: 62.00    Pack years: 15.50    Types: Cigarettes   Smokeless tobacco: Never Used  Substance Use Topics   Alcohol use: No    Alcohol/week: 0.0 standard drinks   Drug use: No     Colonoscopy:  PAP:  Bone density:  Lipid panel:  No Known Allergies  Current Outpatient Medications  Medication Sig Dispense Refill   albuterol (PROVENTIL HFA;VENTOLIN HFA) 108 (90 Base) MCG/ACT inhaler Inhale 2 puffs into the lungs every 6 (six) hours as needed for wheezing or shortness of breath. 1 Inhaler 0   aspirin 81 MG tablet Take 81 mg by mouth daily.     budesonide-formoterol (SYMBICORT) 160-4.5 MCG/ACT inhaler Inhale 2 puffs into the lungs 2 (two) times daily.     levothyroxine (SYNTHROID) 137 MCG tablet Take 137 mcg by mouth daily before breakfast.     tamsulosin (FLOMAX) 0.4 MG CAPS capsule Take 1 capsule by mouth  1 day or 1 dose.     No current facility-administered medications for this visit.     OBJECTIVE: Vitals:   10/26/18 0944 10/26/18 0954  Temp: 97.9 F (36.6 C)   SpO2:  96%     Body mass index is 34.45 kg/m.    ECOG FS:0 - Asymptomatic  General: Well-developed, well-nourished, no acute distress. Eyes: Pink conjunctiva, anicteric sclera. HEENT: Normocephalic, moist mucous membranes. Lungs: Clear to auscultation  bilaterally. Heart: Regular rate and rhythm. No rubs, murmurs, or gallops. Abdomen: Soft, nontender, nondistended. No organomegaly noted, normoactive bowel sounds. Musculoskeletal: No edema, cyanosis, or clubbing. Neuro: Alert, answering all questions appropriately. Cranial nerves grossly intact. Skin: No rashes or petechiae noted. Psych: Normal affect.  LAB RESULTS:  Lab Results  Component Value Date   NA 138 10/09/2018   K 4.1 10/09/2018   CL 106 10/09/2018   CO2 22 10/09/2018   GLUCOSE 102 (H) 10/09/2018   BUN 21 10/09/2018   CREATININE 1.41 (H) 10/09/2018   CALCIUM 9.0 10/09/2018   PROT 6.5 10/31/2014   ALBUMIN 3.8 10/31/2014   AST 18 10/31/2014   ALT 16 10/31/2014   ALKPHOS 84 10/31/2014   BILITOT 0.6 10/31/2014   GFRNONAA 48 (L) 10/09/2018   GFRAA 56 (L) 10/09/2018    Lab Results  Component Value Date   WBC 7.6 10/09/2018   HGB 16.5 10/09/2018   HCT 50.0 10/09/2018   MCV 92.3 10/09/2018   PLT 209 10/09/2018     STUDIES: Nm Pet Image Initial (pi) Skull Base To Thigh  Result Date: 10/08/2018 CLINICAL DATA:  Initial treatment strategy for pulmonary nodule. Lung cancer screening suspicious nodule. History of melanoma December 2019 EXAM: NUCLEAR MEDICINE PET SKULL BASE TO THIGH TECHNIQUE: 13.3 mCi F-18 FDG was injected intravenously. Full-ring PET imaging was performed from the skull base to thigh after the radiotracer. CT data was obtained and used for attenuation correction and anatomic localization. Fasting blood glucose: 3 113 mg/dl COMPARISON:  CT 09/21/2018 FINDINGS: Mediastinal blood pool activity: SUV max 2.9 Liver activity: SUV max NA NECK: No hypermetabolic lymph nodes in the neck. Incidental CT findings: none CHEST: Round nodule in the LEFT upper lobe with spiculated margins measures 20 mm (image 84/3) and has intense hypermetabolic activity with SUV max equal 15.2. Second intensely hypermetabolic nodule just superior to the LEFT hilum measures 14 mm (image 96/)3  with SUV max equal 12.0. No hypermetabolic mediastinal lymph nodes. Within the RIGHT upper lobe small round nodule is smoothly marginated measuring 6 mm (image 83/3) and also has associated metabolic activity with SUV max equal 2.1. No hypermetabolic mediastinal lymph nodes. No hypermetabolic supraclavicular nodes. Incidental CT findings: none ABDOMEN/PELVIS: No abnormal hypermetabolic activity within the liver, pancreas, adrenal glands, or spleen. No hypermetabolic lymph nodes in the abdomen or pelvis. Incidental CT findings: none SKELETON: No focal hypermetabolic activity to suggest skeletal metastasis. Incidental CT findings: none IMPRESSION: 1. Hypermetabolic LEFT upper lobe spiculated nodule most concern for bronchogenic carcinoma. 2. Hypermetabolic pulmonary nodule in the LEFT suprahilar lung consistent metastatic disease. 3. No evidence of mediastinal metastatic disease. 4. Rounded nodule in the LEFT upper lobe with mild radiotracer activity most significant for size is concerning for malignancy. Differential in including second primary bronchogenic carcinoma, metastatic bronchogenic carcinoma, or metastatic melanoma (depending on melanoma stage). Electronically Signed   By: Suzy Bouchard M.D.   On: 10/08/2018 10:57   Dg Chest Port 1 View  Result Date: 10/19/2018 CLINICAL DATA:  Left endobronchial ultrasound postop. EXAM: PORTABLE  CHEST 1 VIEW COMPARISON:  10/20/2015 and chest CT 10/16/2018, PET-CT 10/08/2018 FINDINGS: Somewhat lordotic technique is demonstrated. Lungs are adequately inflated and demonstrate evidence of patient's known left upper lobe hypermetabolic nodule. There is mild prominence of the perihilar markings suggesting mild vascular congestion. No effusion or pneumothorax. Cardiomediastinal silhouette and remainder the exam is unchanged. IMPRESSION: Minimal prominence of the perihilar markings suggesting mild vascular congestion. Known hypermetabolic left upper lobe nodule.  Electronically Signed   By: Marin Olp M.D.   On: 10/19/2018 16:58   Dg C-arm 1-60 Min-no Report  Result Date: 10/19/2018 Fluoroscopy was utilized by the requesting physician.  No radiographic interpretation.   Ct Super D Chest Wo Contrast  Result Date: 10/16/2018 CLINICAL DATA:  Former smoker. Quit 5 days ago. Evaluate lung mass. EXAM: CT CHEST WITHOUT CONTRAST TECHNIQUE: Multidetector CT imaging of the chest was performed using thin slice collimation for electromagnetic bronchoscopy planning purposes, without intravenous contrast. COMPARISON:  10/08/2018 FINDINGS: Cardiovascular: Normal heart size. No pericardial effusion identified. Aortic atherosclerosis. Calcification in the LAD, left circumflex and RCA coronary artery. Mediastinum/Nodes: Normal appearance of the thyroid gland. The trachea appears patent and is midline. Normal appearance of the esophagus. No enlarged mediastinal or hilar adenopathy. Lungs/Pleura: No pleural effusion. Emphysema. There is a spiculated nodule within the left upper lobe measuring 2.4 cm, image 89/6. Unchanged. Unchanged left upper lobe perihilar nodule measuring 1.2 cm, image 30/2. Right upper lobe lung nodule 6 mm, image 21/3. Unchanged. Upper Abdomen: No acute abnormality. Musculoskeletal: Spondylosis within the thoracic spine. No aggressive lytic or sclerotic bone lesions. IMPRESSION: 1. Bilateral upper lobe pulmonary nodules are again noted. These do not appear significantly changed from the PET-CT dated 10/08/2018. 2. Aortic Atherosclerosis (ICD10-I70.0) and Emphysema (ICD10-J43.9). 3. Coronary artery atherosclerotic calcifications. Electronically Signed   By: Kerby Moors M.D.   On: 10/16/2018 15:19    ASSESSMENT: Clinical stage IA3 squamous cell carcinoma, left upper lobe lung.  PLAN:    1.Clinical stage IA3 squamous cell carcinoma, left upper lobe lung: PET scan results from October 08, 2018 reviewed independently and reported as above confirming stage of  disease.  Patient underwent biopsy with navigational bronchoscopy on October 19, 2018 confirming diagnosis.  Patient is completed his PFTs with moderate obstruction.  Once we get approval from the [place], patient will give be given a referral to thoracic surgery for consideration of surgery.  It is unclear if patient is a surgical candidate.  If he is not a surgical candidate, he will return to clinic and have consultation with radiation oncology.  Follow-up is pending based on patient's evaluation by surgery.   2.  History of melanoma: Unclear stage or depth, although by report was in situ.  Patient had Mohs surgery in fall 2019.  Lung biopsy consistent with squamous cell carcinoma.  I spent a total of 30 minutes face-to-face with the patient of which greater than 50% of the visit was spent in counseling and coordination of care as detailed above.    Patient expressed understanding and was in agreement with this plan. He also understands that He can call clinic at any time with any questions, concerns, or complaints.   Cancer Staging Squamous cell carcinoma lung, left ([place]) Staging form: Lung, AJCC 8th Edition - Clinical stage from 10/27/2018: Stage IA3 (cT1c, cN0, cM0) - Signed by Lloyd Huger, MD on 10/27/2018   Lloyd Huger, MD   10/27/2018 8:17 AM

## 2018-10-21 ENCOUNTER — Encounter: Payer: Self-pay | Admitting: Pulmonary Disease

## 2018-10-23 ENCOUNTER — Other Ambulatory Visit: Payer: Self-pay | Admitting: Anatomic Pathology & Clinical Pathology

## 2018-10-23 LAB — CYTOLOGY - NON PAP

## 2018-10-23 LAB — SURGICAL PATHOLOGY

## 2018-10-25 ENCOUNTER — Telehealth: Payer: Self-pay | Admitting: Oncology

## 2018-10-25 ENCOUNTER — Other Ambulatory Visit: Payer: Self-pay

## 2018-10-25 NOTE — Telephone Encounter (Signed)
Spoke with pt wife [name] to confirm appt date/time, do pre-appt screen which was completed, and adv of Covid-19 guidelines for appt regarding screening questions, temperature check, face mask required, and no visitors allowed

## 2018-10-26 ENCOUNTER — Encounter: Payer: Self-pay | Admitting: *Deleted

## 2018-10-26 ENCOUNTER — Encounter: Payer: Self-pay | Admitting: Oncology

## 2018-10-26 ENCOUNTER — Inpatient Hospital Stay (HOSPITAL_BASED_OUTPATIENT_CLINIC_OR_DEPARTMENT_OTHER): Payer: No Typology Code available for payment source | Admitting: Oncology

## 2018-10-26 ENCOUNTER — Other Ambulatory Visit: Payer: Self-pay

## 2018-10-26 DIAGNOSIS — C3412 Malignant neoplasm of upper lobe, left bronchus or lung: Secondary | ICD-10-CM

## 2018-10-26 DIAGNOSIS — Z86000 Personal history of in-situ neoplasm of breast: Secondary | ICD-10-CM

## 2018-10-26 DIAGNOSIS — C3492 Malignant neoplasm of unspecified part of left bronchus or lung: Secondary | ICD-10-CM

## 2018-10-26 DIAGNOSIS — J449 Chronic obstructive pulmonary disease, unspecified: Secondary | ICD-10-CM

## 2018-10-26 DIAGNOSIS — F17210 Nicotine dependence, cigarettes, uncomplicated: Secondary | ICD-10-CM | POA: Diagnosis not present

## 2018-10-26 DIAGNOSIS — R911 Solitary pulmonary nodule: Secondary | ICD-10-CM

## 2018-10-26 NOTE — Progress Notes (Signed)
  Oncology Nurse Navigator Documentation  Navigator Location: [facility] (10/26/18 1000)   )Navigator Encounter Type: Follow-up Appt;Diagnostic Results (10/26/18 1000)   Abnormal Finding Date: 09/21/18 (10/26/18 1000) Confirmed Diagnosis Date: 10/23/18 (10/26/18 1000)                 Treatment Phase: Pre-Tx/Tx Discussion (10/26/18 1000) Barriers/Navigation Needs: Coordination of Care;Education (10/26/18 1000) Education: Newly Diagnosed Cancer Education;Understanding Cancer/ Treatment Options (10/26/18 1000) Interventions: Coordination of Care;Education (10/26/18 1000)   Coordination of Care: Appts (10/26/18 1000) Education Method: Verbal;Written (10/26/18 1000)         met with patient during follow up visit with Dr. Grayland Ormond to discuss biopsy results and treatment options. All questions answered during visit. Pt given resources regarding diagnosis and supportive services available. Reviewed upcoming appts with patient. Informed that someone will notify him with his appt to see Dr. Genevive Bi. Also, informed pt that will contact the [place] to get authorization for referral. Pt instructed to call with any further questions or needs. Nothing further needed at this time.        Time Spent with Patient: 45 (10/26/18 1000)

## 2018-10-26 NOTE — Progress Notes (Signed)
Patient is here today to go over his biopsy results. Patient also stated that he feels that his thyroid might be out of range.

## 2018-10-26 NOTE — Progress Notes (Signed)
Message left with [place] at the [place] [phone], [phone]) to get authorization for referral to Dr. Genevive Bi. Awaiting callback.

## 2018-10-27 DIAGNOSIS — C3492 Malignant neoplasm of unspecified part of left bronchus or lung: Secondary | ICD-10-CM | POA: Insufficient documentation

## 2018-11-01 ENCOUNTER — Other Ambulatory Visit: Payer: Non-veteran care

## 2018-11-01 NOTE — Progress Notes (Signed)
Tumor Board Documentation  Levi [name] was presented by Norvel Richards, RN at our Tumor Board on 11/01/2018, which included representatives from medical oncology, radiation oncology, surgical, radiology, pathology, navigation, internal medicine, research, pulmonology.  Levi [name] currently presents for [facility], for discussion, for new positive pathology, as a new patient with history of the following treatments: active survellience.  Additionally, we reviewed previous medical and familial history, history of present illness, and recent lab results along with all available histopathologic and imaging studies. The tumor board considered available treatment options and made the following recommendations: Additional screening, Surgery, Radiation therapy (primary modality) PFT's followed by surgical evaluation and XRT if not surical candidate  The following procedures/referrals were also placed: No orders of the defined types were placed in this encounter.   Clinical Trial Status: not discussed   Staging used: AJCC Stage Group  AJCC Staging:       Group: Probable Squamous cell Lung Cancer   National site-specific guidelines NCCN were discussed with respect to the case.  Tumor board is a meeting of clinicians from various specialty areas who evaluate and discuss patients for whom a multidisciplinary approach is being considered. Final determinations in the plan of care are those of the provider(s). The responsibility for follow up of recommendations given during tumor board is that of the provider.   Today's extended care, comprehensive team conference, Levi [name] was not present for the discussion and was not examined.   Multidisciplinary Tumor Board is a multidisciplinary case peer review process.  Decisions discussed in the Multidisciplinary Tumor Board reflect the opinions of the specialists present at the conference without having examined the patient.  Ultimately, treatment and diagnostic decisions  rest with the primary provider(s) and the patient.

## 2018-11-05 ENCOUNTER — Other Ambulatory Visit: Payer: Self-pay

## 2018-11-05 ENCOUNTER — Ambulatory Visit: Payer: Medicare Other | Admitting: Cardiothoracic Surgery

## 2018-11-05 ENCOUNTER — Other Ambulatory Visit: Payer: Self-pay | Admitting: *Deleted

## 2018-11-05 ENCOUNTER — Ambulatory Visit (INDEPENDENT_AMBULATORY_CARE_PROVIDER_SITE_OTHER): Payer: Medicare Other | Admitting: Cardiothoracic Surgery

## 2018-11-05 ENCOUNTER — Encounter: Payer: Self-pay | Admitting: Cardiothoracic Surgery

## 2018-11-05 VITALS — BP 148/92 | HR 84 | Temp 98.4°F | Resp 16 | Ht 71.0 in | Wt 247.0 lb

## 2018-11-05 DIAGNOSIS — C3412 Malignant neoplasm of upper lobe, left bronchus or lung: Secondary | ICD-10-CM

## 2018-11-05 MED ORDER — ALPRAZOLAM 0.25 MG PO TABS: 0.2500 mg | ORAL_TABLET | Freq: Two times a day (BID) | ORAL | 1 refills | Status: DC | PRN

## 2018-11-05 NOTE — Progress Notes (Signed)
Patient ID: Levi [name], male   DOB: Apr 06, 1941, 78 y.o.   MRN: [id number]  Chief Complaint  Patient presents with  . New Patient (Initial Visit)    lung nodule    Referred By Dr. Delight Hoh Reason for Referral left upper lobe dominant lesion  HPI Location, Quality, Duration, Severity, Timing, Context, Modifying Factors, Associated Signs and Symptoms.  Levi [name] is a 78 y.o. male.  He has been a lifelong smoker up until the last month.  He smoked 62 years 1 pack to 2 packs/day.  As part of the [place] system he obtained a CT scan of the chest for lung screening.  This was done and may of 2020.  That revealed a left upper lobe nodule as well as a suprahilar left upper lobe mass and a right upper lobe mass.  A subsequent PET scan confirmed that all these lesions were suspicious for malignancy and he then underwent a bronchoscopy with brushings of the left upper lobe area.  This was positive for squamous cell carcinoma.  He was seen by our oncologist and he was sent here for consultation regarding surgical intervention.  He states that he does get short of breath with minimal activity.  He has a chronic cough.  He actually believes his cough may be better since he has stopped smoking.  He has not had any hemoptysis.  He has had a poor appetite and has had some weight loss.  He obtains almost all of his care through the [place] system.  He has had no prior surgical procedures except for a skin cancer removal about 6 months ago.  His father died of carcinoma of the larynx.  His mother died of a ruptured aortic aneurysm.  He is married.  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   Past Medical History:  Diagnosis Date  . Asthma   . COPD (chronic obstructive pulmonary disease) ([place])   . Hearing loss   . Hypothyroidism   . Mass of left lung   . 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 Prostate enlargement   . Shortness of breath   . Thyroid disease   .  Tobacco abuse     Past Surgical History:  Procedure Laterality Date  . ELECTROMAGNETIC NAVIGATION BROCHOSCOPY Left 10/19/2018   Procedure: ELECTROMAGNETIC NAVIGATION BRONCHOSCOPY LEFT;  Surgeon: Tyler Pita, MD;  Location: [facility] ORS;  Service: Cardiopulmonary;  Laterality: Left;  . ENDOBRONCHIAL ULTRASOUND Left 10/19/2018   Procedure: ENDOBRONCHIAL ULTRASOUND LEFT;  Surgeon: Tyler Pita, MD;  Location: [facility] ORS;  Service: Cardiopulmonary;  Laterality: Left;  Marland Kitchen MELANOMA EXCISION Left   . NO PAST SURGERIES      Family History  Problem Relation Age of Onset  . Aneurysm Mother   . Cancer Father     Social History Social History   Tobacco Use  . Smoking status: Former Smoker    Packs/day: 0.25    Years: 62.00    Pack years: 15.50    Types: Cigarettes    Quit date: 09/05/2018    Years since quitting: 0.1  . Smokeless tobacco: Never Used  Substance Use Topics  . Alcohol use: No    Alcohol/week: 0.0 standard drinks  . Drug use: No    No Known Allergies  Current Outpatient Medications  Medication Sig Dispense Refill  . albuterol (PROVENTIL HFA;VENTOLIN HFA) 108 (90 Base) MCG/ACT inhaler Inhale 2 puffs into the lungs  every 6 (six) hours as needed for wheezing or shortness of breath. 1 Inhaler 0  . aspirin 81 MG tablet Take 81 mg by mouth daily.    . budesonide-formoterol (SYMBICORT) 160-4.5 MCG/ACT inhaler Inhale 2 puffs into the lungs 2 (two) times daily.    Marland Kitchen levothyroxine (SYNTHROID) 137 MCG tablet Take 137 mcg by mouth daily before breakfast.    . tamsulosin (FLOMAX) 0.4 MG CAPS capsule Take 1 capsule by mouth 1 day or 1 dose.     No current facility-administered medications for this visit.       Review of Systems A complete review of systems was asked and was negative except for the following positive findings shortness of breath, cough, rash, prostate enlargement, difficulty urinating, hearing loss, nosebleed.  Blood pressure (!) 148/92, pulse 84,  temperature 98.4 F (36.9 C), resp. rate 16, height 5\' 11"  (1.803 m), weight 247 lb (112 kg), SpO2 92 %.  Physical Exam CONSTITUTIONAL:  Pleasant, well-developed, well-nourished, and in no acute distress. EYES: Pupils equal and reactive to light, Sclera non-icteric EARS, NOSE, MOUTH AND THROAT:  The oropharynx was clear.  Dentition is poor repair.  Oral mucosa pink and moist. LYMPH NODES:  Lymph nodes in the neck and axillae were normal RESPIRATORY:  Lungs were clear.  Normal respiratory effort without pathologic use of accessory muscles of respiration CARDIOVASCULAR: Heart was regular without murmurs.  There were no carotid bruits. GI: The abdomen was soft, nontender, and nondistended. There were no palpable masses. There was no hepatosplenomegaly. There were normal bowel sounds in all quadrants. GU:  Rectal deferred.   MUSCULOSKELETAL:  Normal muscle strength and tone.  No clubbing or cyanosis.   SKIN:  There were no pathologic skin lesions.  There were no nodules on palpation. NEUROLOGIC:  Sensation is normal.  Cranial nerves are grossly intact. PSYCH:  Oriented to person, place and time.  Mood and affect are normal.  Data Reviewed CT scan and PET scan  I have personally reviewed the patient's imaging, laboratory findings and medical records.    Assessment    I have independently reviewed his CT scan and PET scan.  There are 2 lesions in the left upper lobe.  There is a dominant mass which measures approximately 2.5 cm with an additional 2 cm suprahilar mass.  Whether this represents a second lung primary or metastatic lesion or lymph node involvement is unclear.  In addition there is a right upper lobe nodule which only measures 6 mm but is hypermetabolic.    Plan    I had a long discussion with him regarding the indications and risks of surgical therapy.  The left suprahilar masses intimately applied to the pulmonary artery.  There is extensive emphysema and he understands that he  would not be a candidate for left pneumonectomy.  I have reviewed his pulmonary function studies.  His FEV1 and DLCO are in the 50-60 range.  I believe that he would be a tolerable candidate for lobectomy but he understands that this may leave behind disease.  He had a lot of questions that he would like me to review with his wife and we will set up another appointment for tomorrow with her so that I can review these films with her as well.     Nestor Lewandowsky, MD 11/05/2018, 12:23 PM

## 2018-11-05 NOTE — Patient Instructions (Addendum)
Follow up here tomorrow at 8:30 am with your wife for a discussion.    We have discussed taking out a lobe of your lung to remove the tumor.   You will be in the hospital for 5-7 days for surgery and recovery. During that period of time, you will most likely be in the Intensive Care Unit for 1-2 days. Expect to be completely back to yourself within 3 months of the date of surgery.  Do NOT take any products containing Aspirin or Ibuprofen 5 days prior to your surgery. (Ex.- Goody Powder, Excedrin, Advil, Aleve, Ibuprofen, and Aspirin). You may take Tylenol if you need something for pain.   Please bring in any FMLA paperwork or short-term disability before your scheduled surgery and we will fill this out.   Call and ask to speak with a nurse if you have any questions or concerns regarding your surgery.    Lung Resection A lung resection is a procedure to remove part or all of a lung. When an entire lung is removed, the procedure is called a pneumonectomy. When only part of a lung is removed, the procedure is called a lobectomy. A lung resection is typically done to get rid of a tumor or cancer, but it may be done to treat other conditions. This procedure can help relieve some or all of your symptoms and can also help keep the problem from getting worse. Lung resection may provide the best chance for curing your disease. However, the procedure may not necessarily cure lung cancer if that is the problem. Tell a health care provider about:  Any allergies you have.  All medicines you are taking, including vitamins, herbs, eye drops, creams, and over-the-counter medicines.  Any problems you or family members have had with anesthetic medicines.  Any blood disorders you have.  Any surgeries you have had.  Any medical conditions you have. What are the risks? Generally, lung resection is a safe procedure. However, problems can occur and include:  Excessive bleeding.  Infection.  Inability  to breathe without a ventilator.  Persistent shortness of breath.  Heart problems, including abnormal rhythms and a risk of heart attack or heart failure.  Blood clots.  Injury to a blood vessel.  Injury to a nerve.  Failure to heal properly.  Stroke.  Bronchopleural fistula. This is a small hole between one of the main breathing tubes (bronchus) and the lining of the lungs. This is rare.  Reaction to anesthesia.  What happens before the procedure? You may have tests done before the procedure, including:  Blood tests.  Urine tests.  X-rays.  Other imaging tests (such as CT scans, MRI scans, and PET scans). These tests are done to find the exact size and location of the condition being treated with this surgery.  Pulmonary function tests. These are breathing tests to assess the function of your lungs before surgery and to decide how to best help your breathing after surgery.  Heart testing. This is done to make sure your heart is strong enough for the procedure.  Bronchoscopy. This is a technique that allows your health care provider to look at the inside of your airways. This is done using a soft, flexible tube (bronchoscope). Along with imaging tests, this can help your health care provider know the exact location and size of the area that will be removed during surgery.  Lymph node sampling. This may need to be done to see if the tumor has spread. It may be done  as a separate surgery or right before your lung resection procedure.  What happens during the procedure?  An IV tube will be placed in your arm. You will be given a medicine that makes you fall asleep (general anesthetic). You may also get pain medicine through a thin, flexible tube (catheter) in your back.  A breathing tube will be placed in your throat.  Once the surgical team has prepared you for surgery, your surgeon will make an incision on your side. Some resections are done through large incisions, while  others can be done through small incisions using smaller instruments and assisted with small cameras (laparoscopic surgery).  Your surgeon will carefully cut the veins, arteries, and bronchus leading to your lung. After being cut, each of these pieces will be sewn or stapled closed. The lung or part of the lung will then be removed.  Your surgeon will check inside your chest to make sure there is no bleeding in or around the lungs. Lymph nodes near the lung may also be removed for later tests.  Your surgeon may put tubes into your chest to drain extra fluid and air after surgery.  Your incision will be closed. This may be done using: ? Stitches that absorb into your body and do not need to be removed. ? Stitches that must be removed. ? Staples that must be removed. What happens after the procedure?  You will be taken to the recovery area and your progress will be monitored. You may still have a breathing tube and other tubes or catheters in your body immediately after surgery. These will be removed during your recovery. You may be put on a respirator following surgery if some assistance is needed to help your breathing. When you are awake and not experiencing immediate problems from surgery, you will be moved to the intensive care unit (ICU) where you will continue your recovery.  You may feel pain in your chest and throat. Sometimes during recovery, patients may shiver or feel nauseous. You will be given medicine to help with pain and nausea.  The breathing tube will be taken out as soon as your health care providers feel you can breathe on your own. For most people, this happens on the same day as the surgery.  If your surgery and time in the ICU go well, most of the tubes and equipment will be taken out within 1-2 days after surgery. This is about how long most people stay in the ICU. You may need to stay longer, depending on how you are doing.  You should also start respiratory therapy in  the ICU. This therapy uses breathing exercises to help your other lung stay healthy and get stronger.  As you improve, you will be moved to a regular hospital room for continued respiratory therapy, help with your bladder and bowels, and to continue medicines.  After your lung or part of your lung is taken out, there will be a space inside your chest. This space will often fill up with fluid over time. The amount of time this takes is different for each person.  You will receive care until you are doing well and your health care provider feels it is safe for you to go home or to transfer to an extended care facility. This information is not intended to replace advice given to you by your health care provider. Make sure you discuss any questions you have with your health care provider. Document Released: 07/09/2002 Document Revised: 09/24/2015 Document  Reviewed: 06/07/2013 Elsevier Interactive Patient Education  [employer].

## 2018-11-06 ENCOUNTER — Ambulatory Visit: Payer: Non-veteran care | Admitting: Cardiothoracic Surgery

## 2018-11-06 ENCOUNTER — Encounter: Payer: Self-pay | Admitting: Cardiothoracic Surgery

## 2018-11-06 VITALS — BP 130/75 | HR 69 | Temp 97.5°F | Ht 68.0 in | Wt 247.0 lb

## 2018-11-06 DIAGNOSIS — C3412 Malignant neoplasm of upper lobe, left bronchus or lung: Secondary | ICD-10-CM

## 2018-11-06 NOTE — Patient Instructions (Addendum)
We will send the referral to [facility] from their office will call to schedule an appointment. If you do not hear from their office within 5-7 days please call our office and let us know so we can check on this for you.  Please call our office if you have questions or concerns.

## 2018-11-06 NOTE — Progress Notes (Signed)
I had the opportunity to see Mr. Levi [name] today with his wife and daughter.  I had seen the patient yesterday and because of the complexity of his pulmonary situation he elected to bring his family in today for family discussion.  I spent 30 minutes with the patient and the family of which more than half was spent in consultation and of treatment planning.  I reviewed with the family the results of the CT scans.  We also reviewed the PET scan.  We reviewed the pulmonary function tests as well as the results of the pathology.  The PET scan suggest a dominant left upper lobe mass with a more central left suprahilar lesion which may be either lymph node or a second lung mass.  In addition there is a smaller right upper lobe nodule that is suspicious for malignancy.  I reviewed with him the results of the pulmonary function tests which reveal an FEV1 and a DLCO that are acceptable for lobectomy.  The FEV1 and DLCO are in the low 60s.  I reviewed the other options including nonsurgical interventions for lung cancer.  I explained to them that I am not a radiation therapist or an oncologist and that I would defer all those decisions to my colleagues.  After extensive discussion with the patient and his family they have elected to meet with Dr. Berton Mount and radiation therapy to review those options as well.  I offered the patient and his family my business card and all their questions were answered today.

## 2018-11-09 ENCOUNTER — Other Ambulatory Visit: Payer: Self-pay

## 2018-11-12 ENCOUNTER — Encounter: Payer: Self-pay | Admitting: *Deleted

## 2018-11-12 ENCOUNTER — Encounter: Payer: Self-pay | Admitting: Radiation Oncology

## 2018-11-12 ENCOUNTER — Ambulatory Visit
Admission: RE | Admit: 2018-11-12 | Discharge: 2018-11-12 | Disposition: A | Discharge status: Home / Self Care | Payer: No Typology Code available for payment source | Source: Ambulatory Visit | Attending: Radiation Oncology | Admitting: Radiation Oncology

## 2018-11-12 ENCOUNTER — Other Ambulatory Visit: Payer: Self-pay

## 2018-11-12 VITALS — BP 165/89 | HR 82 | Temp 97.6°F | Resp 20 | Wt 246.9 lb

## 2018-11-12 DIAGNOSIS — E039 Hypothyroidism, unspecified: Secondary | ICD-10-CM | POA: Insufficient documentation

## 2018-11-12 DIAGNOSIS — J439 Emphysema, unspecified: Secondary | ICD-10-CM | POA: Insufficient documentation

## 2018-11-12 DIAGNOSIS — N400 Enlarged prostate without lower urinary tract symptoms: Secondary | ICD-10-CM | POA: Diagnosis not present

## 2018-11-12 DIAGNOSIS — Z809 Family history of malignant neoplasm, unspecified: Secondary | ICD-10-CM | POA: Insufficient documentation

## 2018-11-12 DIAGNOSIS — Z7982 Long term (current) use of aspirin: Secondary | ICD-10-CM | POA: Diagnosis not present

## 2018-11-12 DIAGNOSIS — C3412 Malignant neoplasm of upper lobe, left bronchus or lung: Secondary | ICD-10-CM | POA: Insufficient documentation

## 2018-11-12 DIAGNOSIS — R634 Abnormal weight loss: Secondary | ICD-10-CM | POA: Insufficient documentation

## 2018-11-12 DIAGNOSIS — Z85820 Personal history of malignant melanoma of skin: Secondary | ICD-10-CM | POA: Insufficient documentation

## 2018-11-12 DIAGNOSIS — F17210 Nicotine dependence, cigarettes, uncomplicated: Secondary | ICD-10-CM | POA: Insufficient documentation

## 2018-11-12 DIAGNOSIS — Z79899 Other long term (current) drug therapy: Secondary | ICD-10-CM | POA: Diagnosis not present

## 2018-11-12 DIAGNOSIS — C3492 Malignant neoplasm of unspecified part of left bronchus or lung: Secondary | ICD-10-CM

## 2018-11-12 NOTE — Progress Notes (Signed)
  Oncology Nurse Navigator Documentation  Navigator Location: [facility] (11/12/18 1100)   )Navigator Encounter Type: Initial RadOnc (11/12/18 1100)                       Treatment Phase: Pre-Tx/Tx Discussion (11/12/18 1100) Barriers/Navigation Needs: Coordination of Care (11/12/18 1100)   Interventions: Coordination of Care (11/12/18 1100)   Coordination of Care: Appts;Chemo (11/12/18 1100)         met with patient during initial rad-onc consultation with Dr. Baruch Gouty to discuss radiation treatment. All questions answered during visit. Pt informed that will discuss with Dr. Grayland Ormond if weekly chemotherapy would be needed with treatment plan. Reviewed upcoming appts and informed that he will be notified at simulation appt on Wednesday regarding chemotherapy. Instructed pt to call if has any further questions or needs. Pt verbalized understanding.     Pt scheduled for follow up with Dr. Grayland Ormond on Raynelle Dick 7/16 and tentatively scheduled for chemo education on Monday 7/20. Will review these appts with patient at next visit.      Time Spent with Patient: 30 (11/12/18 1100)

## 2018-11-12 NOTE — Consult Note (Signed)
NEW PATIENT EVALUATION  Name: Levi [name]  MRN: [id number]  Date:   11/12/2018     DOB: 1940-05-09   This 78 y.o. 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  REFERRING PHYSICIAN: Aros, Rubie Maid, MD  CHIEF COMPLAINT:  Chief Complaint  Patient presents with  . Lung Cancer    DIAGNOSIS: The encounter diagnosis was Squamous cell carcinoma lung, left ([place]).   PREVIOUS INVESTIGATIONS:  PET CT scan reviewed Pathology report reviewed Clinical notes reviewed   HPI: Patient is a 78 year old male with over 100-pack-year smoking history.  Underwent CT screening of his chest in May 2020 showed a left upper lobe nodule as well as a suprahilar left upper lobe mass.  There is also a small indistinct right upper lobe mass.  PET CT scan showed hypermetabolic activity showing all 3 areas suspicious for malignancy.  He underwent bronchoscopy with pathology positive for squamous cell carcinoma.  He was referred to Dr. Faith Rogue for surgical intervention.  Based on his FEV1 and DLCO in the 50-60 range he would not be a candidate for pneumonectomy although possibly for upper lobectomy.  Dr. Faith Rogue also felt the left suprahilar mass intimately applied to the pulmonary artery.  Also noted extensive history of emphysema patient is having some minor weight loss no dysphasia specifically denies hemoptysis.  I been asked to evaluate him for possible radiation therapy.  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    PAST SURGICAL HISTORY:  Past Surgical History:  Procedure Laterality Date  .  ELECTROMAGNETIC NAVIGATION BROCHOSCOPY Left 10/19/2018   Procedure: ELECTROMAGNETIC NAVIGATION BRONCHOSCOPY LEFT;  Surgeon: Tyler Pita, MD;  Location: [facility] ORS;  Service: Cardiopulmonary;  Laterality: Left;  . ENDOBRONCHIAL ULTRASOUND Left 10/19/2018   Procedure: ENDOBRONCHIAL ULTRASOUND LEFT;  Surgeon: Tyler Pita, MD;  Location: [facility] ORS;  Service: Cardiopulmonary;  Laterality: Left;  Marland Kitchen MELANOMA EXCISION Left   . NO PAST SURGERIES      FAMILY HISTORY: family history includes Aneurysm in his mother; Cancer in his father.  SOCIAL HISTORY:  reports that he quit smoking about 2 months ago. His smoking use included cigarettes. He has a 15.50 pack-year smoking history. He has never used smokeless tobacco. He reports that he does not drink alcohol or use drugs.  ALLERGIES: Patient has no known allergies.  MEDICATIONS:  Current Outpatient Medications  Medication Sig Dispense Refill  . albuterol (PROVENTIL HFA;VENTOLIN HFA) 108 (90 Base) MCG/ACT inhaler Inhale 2 puffs into the lungs every 6 (six) hours as needed for wheezing or shortness of breath. 1 Inhaler 0  . ALPRAZolam (XANAX) 0.25 MG tablet Take 1 tablet (0.25 mg total) by mouth 2 (two) times daily as needed for anxiety. 30 tablet 1  . aspirin 81 MG tablet Take 81 mg by mouth daily.    . budesonide-formoterol (SYMBICORT) 160-4.5 MCG/ACT inhaler Inhale 2 puffs into the lungs 2 (two) times daily.    Marland Kitchen levothyroxine (SYNTHROID) 137 MCG tablet Take 137 mcg by mouth daily before breakfast.    . tamsulosin (FLOMAX) 0.4 MG CAPS capsule Take 1 capsule by mouth 1 day or 1 dose.  No current facility-administered medications for this encounter.     ECOG PERFORMANCE STATUS:  0 - Asymptomatic  REVIEW OF SYSTEMS: Patient denies any weight loss, fatigue, weakness, fever, chills or night sweats. Patient denies any loss of vision, blurred vision. Patient denies any ringing  of the ears or hearing loss. No irregular heartbeat. Patient  denies heart murmur or history of fainting. Patient denies any chest pain or pain radiating to her upper extremities. Patient denies any shortness of breath, difficulty breathing at night, cough or hemoptysis. Patient denies any swelling in the lower legs. Patient denies any nausea vomiting, vomiting of blood, or coffee ground material in the vomitus. Patient denies any stomach pain. Patient states has had normal bowel movements no significant constipation or diarrhea. Patient denies any dysuria, hematuria or significant nocturia. Patient denies any problems walking, swelling in the joints or loss of balance. Patient denies any skin changes, loss of hair or loss of weight. Patient denies any excessive worrying or anxiety or significant depression. Patient denies any problems with insomnia. Patient denies excessive thirst, polyuria, polydipsia. Patient denies any swollen glands, patient denies easy bruising or easy bleeding. Patient denies any recent infections, allergies or URI. Patient "s visual fields have not changed significantly in recent time.   PHYSICAL EXAM: BP (!) 165/89   Pulse 82   Temp 97.6 F (36.4 C)   Resp 20   Wt 246 lb 14.6 oz (112 kg)   BMI 37.54 kg/m  Well-developed well-nourished patient in NAD. HEENT reveals PERLA, EOMI, discs not visualized.  Oral cavity is clear. No oral mucosal lesions are identified. Neck is clear without evidence of cervical or supraclavicular adenopathy. Lungs are clear to A&P. Cardiac examination is essentially unremarkable with regular rate and rhythm without murmur rub or thrill. Abdomen is benign with no organomegaly or masses noted. Motor sensory and DTR levels are equal and symmetric in the upper and lower extremities. Cranial nerves II through XII are grossly intact. Proprioception is intact. No peripheral adenopathy or edema is identified. No motor or sensory levels are noted. Crude visual fields are within normal range.  LABORATORY DATA: Cytology from  left upper lobe positive for non-small cell lung cancer favoring squamous cell carcinoma    RADIOLOGY RESULTS: PET CT scan reviewed   IMPRESSION: Stage IIa (T1 N1 M0) squamous cell carcinoma of the left upper lobe with possible second primary in right upper lobe in a 78 year old male  PLAN: At this time I have given the patient the option of IMRT radiation therapy to his left upper lobe and left suprahilar region.  I would plan on delivering 7000 cGy to both lesions.  I have also asked for a medical oncologist Dr. Grayland Ormond to give me opinion about possible concurrent chemotherapy with radiation.  Risks and benefits of radiation including skin reaction fatigue alteration of blood counts possible to say dysphasia from radiation esophagitis all were described in detail to the patient.  I have also spoke by phone with the patient's daughter who comprehends my treatment plan well.  Family and patient are not in favor surgical intervention at this time.  I have personally set up and ordered CT simulation for later this week.  We will use motion restriction and 4-dimensional treatment planning.  Patient and family comprehend my treatment plan well.  I would like to take this opportunity to thank you for allowing me to participate in the care of your patient.Noreene Filbert, MD

## 2018-11-13 ENCOUNTER — Other Ambulatory Visit: Payer: Self-pay

## 2018-11-14 ENCOUNTER — Other Ambulatory Visit: Payer: Self-pay

## 2018-11-14 ENCOUNTER — Ambulatory Visit
Admission: RE | Admit: 2018-11-14 | Discharge: 2018-11-14 | Disposition: A | Discharge status: Home / Self Care | Payer: No Typology Code available for payment source | Source: Ambulatory Visit | Attending: Radiation Oncology | Admitting: Radiation Oncology

## 2018-11-14 DIAGNOSIS — Z85820 Personal history of malignant melanoma of skin: Secondary | ICD-10-CM | POA: Diagnosis not present

## 2018-11-14 DIAGNOSIS — C3412 Malignant neoplasm of upper lobe, left bronchus or lung: Secondary | ICD-10-CM | POA: Diagnosis not present

## 2018-11-14 DIAGNOSIS — Z87891 Personal history of nicotine dependence: Secondary | ICD-10-CM | POA: Diagnosis not present

## 2018-11-14 DIAGNOSIS — F419 Anxiety disorder, unspecified: Secondary | ICD-10-CM | POA: Diagnosis not present

## 2018-11-14 DIAGNOSIS — E039 Hypothyroidism, unspecified: Secondary | ICD-10-CM | POA: Diagnosis not present

## 2018-11-14 DIAGNOSIS — Z510 Encounter for antineoplastic radiation therapy: Secondary | ICD-10-CM | POA: Diagnosis not present

## 2018-11-14 DIAGNOSIS — Z5111 Encounter for antineoplastic chemotherapy: Secondary | ICD-10-CM | POA: Diagnosis not present

## 2018-11-14 DIAGNOSIS — J449 Chronic obstructive pulmonary disease, unspecified: Secondary | ICD-10-CM | POA: Diagnosis not present

## 2018-11-14 NOTE — Patient Instructions (Signed)
Paclitaxel injection What is this medicine? PACLITAXEL (PAK li TAX el) is a chemotherapy drug. It targets fast dividing cells, like cancer cells, and causes these cells to die. This medicine is used to treat ovarian cancer, breast cancer, lung cancer, Kaposi's sarcoma, and other cancers. This medicine may be used for other purposes; ask your health care provider or pharmacist if you have questions. COMMON BRAND NAME(S): Onxol, Taxol What should I tell my health care provider before I take this medicine? They need to know if you have any of these conditions:  history of irregular heartbeat  liver disease  low blood counts, like low white cell, platelet, or red cell counts  lung or breathing disease, like asthma  tingling of the fingers or toes, or other nerve disorder  an unusual or allergic reaction to paclitaxel, alcohol, polyoxyethylated castor oil, other chemotherapy, other medicines, foods, dyes, or preservatives  pregnant or trying to get pregnant  breast-feeding How should I use this medicine? This drug is given as an infusion into a vein. It is administered in a hospital or clinic by a specially trained health care professional. Talk to your pediatrician regarding the use of this medicine in children. Special care may be needed. Overdosage: If you think you have taken too much of this medicine contact a poison control center or emergency room at once. NOTE: This medicine is only for you. Do not share this medicine with others. What if I miss a dose? It is important not to miss your dose. Call your doctor or health care professional if you are unable to keep an appointment. What may interact with this medicine? Do not take this medicine with any of the following medications:  disulfiram  metronidazole This medicine may also interact with the following medications:  antiviral medicines for hepatitis, HIV or AIDS  certain antibiotics like erythromycin and  clarithromycin  certain medicines for fungal infections like ketoconazole and itraconazole  certain medicines for seizures like carbamazepine, phenobarbital, phenytoin  gemfibrozil  nefazodone  rifampin  St. John's wort This list may not describe all possible interactions. Give your health care provider a list of all the medicines, herbs, non-prescription drugs, or dietary supplements you use. Also tell them if you smoke, drink alcohol, or use illegal drugs. Some items may interact with your medicine. What should I watch for while using this medicine? Your condition will be monitored carefully while you are receiving this medicine. You will need important blood work done while you are taking this medicine. This medicine can cause serious allergic reactions. To reduce your risk you will need to take other medicine(s) before treatment with this medicine. If you experience allergic reactions like skin rash, itching or hives, swelling of the face, lips, or tongue, tell your doctor or health care professional right away. In some cases, you may be given additional medicines to help with side effects. Follow all directions for their use. This drug may make you feel generally unwell. This is not uncommon, as chemotherapy can affect healthy cells as well as cancer cells. Report any side effects. Continue your course of treatment even though you feel ill unless your doctor tells you to stop. Call your doctor or health care professional for advice if you get a fever, chills or sore throat, or other symptoms of a cold or flu. Do not treat yourself. This drug decreases your body's ability to fight infections. Try to avoid being around people who are sick. This medicine may increase your risk to bruise  or bleed. Call your doctor or health care professional if you notice any unusual bleeding. Be careful brushing and flossing your teeth or using a toothpick because you may get an infection or bleed more easily.  If you have any dental work done, tell your dentist you are receiving this medicine. Avoid taking products that contain aspirin, acetaminophen, ibuprofen, naproxen, or ketoprofen unless instructed by your doctor. These medicines may hide a fever. Do not become pregnant while taking this medicine. Women should inform their doctor if they wish to become pregnant or think they might be pregnant. There is a potential for serious side effects to an unborn child. Talk to your health care professional or pharmacist for more information. Do not breast-feed an infant while taking this medicine. Men are advised not to father a child while receiving this medicine. This product may contain alcohol. Ask your pharmacist or healthcare provider if this medicine contains alcohol. Be sure to tell all healthcare providers you are taking this medicine. Certain medicines, like metronidazole and disulfiram, can cause an unpleasant reaction when taken with alcohol. The reaction includes flushing, headache, nausea, vomiting, sweating, and increased thirst. The reaction can last from 30 minutes to several hours. What side effects may I notice from receiving this medicine? Side effects that you should report to your doctor or health care professional as soon as possible:  allergic reactions like skin rash, itching or hives, swelling of the face, lips, or tongue  breathing problems  changes in vision  fast, irregular heartbeat  high or low blood pressure  mouth sores  pain, tingling, numbness in the hands or feet  signs of decreased platelets or bleeding - bruising, pinpoint red spots on the skin, black, tarry stools, blood in the urine  signs of decreased red blood cells - unusually weak or tired, feeling faint or lightheaded, falls  signs of infection - fever or chills, cough, sore throat, pain or difficulty passing urine  signs and symptoms of liver injury like dark yellow or brown urine; general ill feeling or  flu-like symptoms; light-colored stools; loss of appetite; nausea; right upper belly pain; unusually weak or tired; yellowing of the eyes or skin  swelling of the ankles, feet, hands  unusually slow heartbeat Side effects that usually do not require medical attention (report to your doctor or health care professional if they continue or are bothersome):  diarrhea  hair loss  loss of appetite  muscle or joint pain  nausea, vomiting  pain, redness, or irritation at site where injected  tiredness This list may not describe all possible side effects. Call your doctor for medical advice about side effects. You may report side effects to FDA at 1-800-FDA-1088. Where should I keep my medicine? This drug is given in a hospital or clinic and will not be stored at home. NOTE: This sheet is a summary. It may not cover all possible information. If you have questions about this medicine, talk to your doctor, pharmacist, or health care provider.  2020 Elsevier/Gold Standard (2016-12-20 13:14:55) Carboplatin injection What is this medicine? CARBOPLATIN (KAR boe pla tin) is a chemotherapy drug. It targets fast dividing cells, like cancer cells, and causes these cells to die. This medicine is used to treat ovarian cancer and many other cancers. This medicine may be used for other purposes; ask your health care provider or pharmacist if you have questions. COMMON BRAND NAME(S): Paraplatin What should I tell my health care provider before I take this medicine? They need to  know if you have any of these conditions:  blood disorders  hearing problems  kidney disease  recent or ongoing radiation therapy  an unusual or allergic reaction to carboplatin, cisplatin, other chemotherapy, other medicines, foods, dyes, or preservatives  pregnant or trying to get pregnant  breast-feeding How should I use this medicine? This drug is usually given as an infusion into a vein. It is administered in a  hospital or clinic by a specially trained health care professional. Talk to your pediatrician regarding the use of this medicine in children. Special care may be needed. Overdosage: If you think you have taken too much of this medicine contact a poison control center or emergency room at once. NOTE: This medicine is only for you. Do not share this medicine with others. What if I miss a dose? It is important not to miss a dose. Call your doctor or health care professional if you are unable to keep an appointment. What may interact with this medicine?  medicines for seizures  medicines to increase blood counts like filgrastim, pegfilgrastim, sargramostim  some antibiotics like amikacin, gentamicin, neomycin, streptomycin, tobramycin  vaccines Talk to your doctor or health care professional before taking any of these medicines:  acetaminophen  aspirin  ibuprofen  ketoprofen  naproxen This list may not describe all possible interactions. Give your health care provider a list of all the medicines, herbs, non-prescription drugs, or dietary supplements you use. Also tell them if you smoke, drink alcohol, or use illegal drugs. Some items may interact with your medicine. What should I watch for while using this medicine? Your condition will be monitored carefully while you are receiving this medicine. You will need important blood work done while you are taking this medicine. This drug may make you feel generally unwell. This is not uncommon, as chemotherapy can affect healthy cells as well as cancer cells. Report any side effects. Continue your course of treatment even though you feel ill unless your doctor tells you to stop. In some cases, you may be given additional medicines to help with side effects. Follow all directions for their use. Call your doctor or health care professional for advice if you get a fever, chills or sore throat, or other symptoms of a cold or flu. Do not treat  yourself. This drug decreases your body's ability to fight infections. Try to avoid being around people who are sick. This medicine may increase your risk to bruise or bleed. Call your doctor or health care professional if you notice any unusual bleeding. Be careful brushing and flossing your teeth or using a toothpick because you may get an infection or bleed more easily. If you have any dental work done, tell your dentist you are receiving this medicine. Avoid taking products that contain aspirin, acetaminophen, ibuprofen, naproxen, or ketoprofen unless instructed by your doctor. These medicines may hide a fever. Do not become pregnant while taking this medicine. Women should inform their doctor if they wish to become pregnant or think they might be pregnant. There is a potential for serious side effects to an unborn child. Talk to your health care professional or pharmacist for more information. Do not breast-feed an infant while taking this medicine. What side effects may I notice from receiving this medicine? Side effects that you should report to your doctor or health care professional as soon as possible:  allergic reactions like skin rash, itching or hives, swelling of the face, lips, or tongue  signs of infection - fever or  chills, cough, sore throat, pain or difficulty passing urine  signs of decreased platelets or bleeding - bruising, pinpoint red spots on the skin, black, tarry stools, nosebleeds  signs of decreased red blood cells - unusually weak or tired, fainting spells, lightheadedness  breathing problems  changes in hearing  changes in vision  chest pain  high blood pressure  low blood counts - This drug may decrease the number of white blood cells, red blood cells and platelets. You may be at increased risk for infections and bleeding.  nausea and vomiting  pain, swelling, redness or irritation at the injection site  pain, tingling, numbness in the hands or  feet  problems with balance, talking, walking  trouble passing urine or change in the amount of urine Side effects that usually do not require medical attention (report to your doctor or health care professional if they continue or are bothersome):  hair loss  loss of appetite  metallic taste in the mouth or changes in taste This list may not describe all possible side effects. Call your doctor for medical advice about side effects. You may report side effects to FDA at 1-800-FDA-1088. Where should I keep my medicine? This drug is given in a hospital or clinic and will not be stored at home. NOTE: This sheet is a summary. It may not cover all possible information. If you have questions about this medicine, talk to your doctor, pharmacist, or health care provider.  2020 Elsevier/Gold Standard (2007-07-24 14:38:05)

## 2018-11-15 ENCOUNTER — Inpatient Hospital Stay: Payer: No Typology Code available for payment source | Attending: Oncology | Admitting: Oncology

## 2018-11-15 ENCOUNTER — Other Ambulatory Visit: Payer: Self-pay | Admitting: *Deleted

## 2018-11-15 ENCOUNTER — Other Ambulatory Visit: Payer: Self-pay

## 2018-11-15 ENCOUNTER — Encounter: Payer: Self-pay | Admitting: Oncology

## 2018-11-15 VITALS — BP 170/89 | HR 68 | Temp 98.1°F | Ht 71.0 in | Wt 248.0 lb

## 2018-11-15 DIAGNOSIS — Z510 Encounter for antineoplastic radiation therapy: Secondary | ICD-10-CM | POA: Insufficient documentation

## 2018-11-15 DIAGNOSIS — F419 Anxiety disorder, unspecified: Secondary | ICD-10-CM | POA: Insufficient documentation

## 2018-11-15 DIAGNOSIS — J449 Chronic obstructive pulmonary disease, unspecified: Secondary | ICD-10-CM | POA: Diagnosis not present

## 2018-11-15 DIAGNOSIS — E039 Hypothyroidism, unspecified: Secondary | ICD-10-CM | POA: Diagnosis not present

## 2018-11-15 DIAGNOSIS — Z87891 Personal history of nicotine dependence: Secondary | ICD-10-CM | POA: Insufficient documentation

## 2018-11-15 DIAGNOSIS — Z5111 Encounter for antineoplastic chemotherapy: Secondary | ICD-10-CM | POA: Insufficient documentation

## 2018-11-15 DIAGNOSIS — C3492 Malignant neoplasm of unspecified part of left bronchus or lung: Secondary | ICD-10-CM

## 2018-11-15 DIAGNOSIS — C3412 Malignant neoplasm of upper lobe, left bronchus or lung: Secondary | ICD-10-CM | POA: Diagnosis not present

## 2018-11-15 DIAGNOSIS — Z85820 Personal history of malignant melanoma of skin: Secondary | ICD-10-CM | POA: Insufficient documentation

## 2018-11-15 NOTE — Progress Notes (Signed)
Entered in error

## 2018-11-15 NOTE — Progress Notes (Signed)
Patient is here today to discuss his chemo treatment.

## 2018-11-16 MED ORDER — ONDANSETRON HCL 8 MG PO TABS: 8.0000 mg | ORAL_TABLET | Freq: Two times a day (BID) | ORAL | 2 refills | Status: DC | PRN

## 2018-11-16 MED ORDER — PROCHLORPERAZINE MALEATE 10 MG PO TABS: 10.0000 mg | ORAL_TABLET | Freq: Four times a day (QID) | ORAL | 2 refills | Status: DC | PRN

## 2018-11-16 NOTE — Progress Notes (Signed)
START ON PATHWAY REGIMEN - Non-Small Cell Lung     Administer weekly:     Paclitaxel      Carboplatin   **Always confirm dose/schedule in your pharmacy ordering system**  Patient Characteristics: Stage IIA/IIB - Unresectable AJCC T Category: T1c Current Disease Status: No Distant Mets or Local Recurrence AJCC N Category: N1 AJCC M Category: M0 AJCC 8 Stage Grouping: IIB Intent of Therapy: Curative Intent, Discussed with Patient

## 2018-11-16 NOTE — Progress Notes (Signed)
[place]  [phone] Fax:(336) [phone]  ID: Levi [name] OB: 03-Apr-1941  MR#: [id number]  KZL#:[id number]  Patient Care Team: Blane Ohara, MD as PCP - General (Family Medicine) Telford Nab, RN as Registered Nurse  CHIEF COMPLAINT: Clinical stage IIB squamous cell carcinoma, left upper lobe lung.  INTERVAL HISTORY: Patient returns to clinic today for further evaluation and treatment planning.  There is suspicion that patient may possibly have N1 disease therefore increasing his clinical stage.  He had consultation with radiation oncology and recommendation was to pursue XRT along with concurrent chemotherapy.  He continues to be highly anxious, but otherwise feels well.  He has no neurologic complaints.  He denies any recent fevers or illnesses.  He has a good appetite and denies weight loss.  He has no chest pain, shortness of breath, cough, or hemoptysis.  He denies any nausea, vomiting, constipation, or diarrhea.  He has no urinary complaints.  Patient offers no further specific complaints today.  REVIEW OF SYSTEMS:   Review of Systems  Constitutional: Negative.  Negative for fever, malaise/fatigue and weight loss.  Respiratory: Negative.  Negative for cough, hemoptysis and shortness of breath.   Cardiovascular: Negative.  Negative for chest pain and leg swelling.  Gastrointestinal: Negative.  Negative for abdominal pain.  Genitourinary: Negative.  Negative for dysuria.  Musculoskeletal: Negative.  Negative for back pain.  Skin: Negative.  Negative for rash.  Neurological: Negative.  Negative for dizziness, focal weakness, weakness and headaches.  Psychiatric/Behavioral: The patient is nervous/anxious.     As per HPI. Otherwise, a complete review of systems is negative.  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[place])    Prostate enlargement    Shortness of breath    Thyroid disease    Tobacco abuse     PAST SURGICAL HISTORY: Past Surgical History:  Procedure Laterality Date   ELECTROMAGNETIC NAVIGATION BROCHOSCOPY Left 10/19/2018   Procedure: ELECTROMAGNETIC NAVIGATION BRONCHOSCOPY LEFT;  Surgeon: Tyler Pita, MD;  Location: [facility] ORS;  Service: Cardiopulmonary;  Laterality: Left;   ENDOBRONCHIAL ULTRASOUND Left 10/19/2018   Procedure: ENDOBRONCHIAL ULTRASOUND LEFT;  Surgeon: Tyler Pita, MD;  Location: [facility] ORS;  Service: Cardiopulmonary;  Laterality: Left;   MELANOMA EXCISION Left    NO PAST SURGERIES      FAMILY HISTORY: Family History  Problem Relation Age of Onset   Aneurysm Mother    Cancer Father     ADVANCED DIRECTIVES (Y/N):  N  HEALTH MAINTENANCE: Social History   Tobacco Use   Smoking status: Former Smoker    Packs/day: 0.25    Years: 62.00    Pack years: 15.50    Types: Cigarettes    Quit date: 09/05/2018    Years since quitting: 0.1   Smokeless tobacco: Never Used  Substance Use Topics   Alcohol use: No    Alcohol/week: 0.0 standard drinks   Drug use: No     Colonoscopy:  PAP:  Bone density:  Lipid panel:  No Known Allergies  Current Outpatient Medications  Medication Sig Dispense Refill   albuterol (PROVENTIL HFA;VENTOLIN HFA) 108 (90 Base) MCG/ACT inhaler Inhale 2 puffs into the lungs every 6 (six) hours as needed for wheezing or shortness of breath. 1 Inhaler 0   ALPRAZolam (  XANAX) 0.25 MG tablet Take 1 tablet (0.25 mg total) by mouth 2 (two) times daily as needed for anxiety. 30 tablet 1   aspirin 81 MG tablet Take 81 mg by mouth daily.     budesonide-formoterol (SYMBICORT) 160-4.5 MCG/ACT inhaler Inhale 2 puffs into the lungs 2 (two) times daily.     levothyroxine (SYNTHROID) 137 MCG tablet Take 137 mcg by mouth daily before breakfast.     tamsulosin (FLOMAX) 0.4 MG CAPS  capsule Take 1 capsule by mouth 1 day or 1 dose.     No current facility-administered medications for this visit.     OBJECTIVE: Vitals:   11/15/18 1505  BP: (!) 170/89  Pulse: 68  Temp: 98.1 F (36.7 C)     Body mass index is 34.59 kg/m.    ECOG FS:0 - Asymptomatic  General: Well-developed, well-nourished, no acute distress. Eyes: Pink conjunctiva, anicteric sclera. HEENT: Normocephalic, moist mucous membranes. Lungs: Clear to auscultation bilaterally. Heart: Regular rate and rhythm. No rubs, murmurs, or gallops. Abdomen: Soft, nontender, nondistended. No organomegaly noted, normoactive bowel sounds. Musculoskeletal: No edema, cyanosis, or clubbing. Neuro: Alert, answering all questions appropriately. Cranial nerves grossly intact. Skin: No rashes or petechiae noted. Psych: Normal affect.  LAB RESULTS:  Lab Results  Component Value Date   NA 138 10/09/2018   K 4.1 10/09/2018   CL 106 10/09/2018   CO2 22 10/09/2018   GLUCOSE 102 (H) 10/09/2018   BUN 21 10/09/2018   CREATININE 1.41 (H) 10/09/2018   CALCIUM 9.0 10/09/2018   PROT 6.5 10/31/2014   ALBUMIN 3.8 10/31/2014   AST 18 10/31/2014   ALT 16 10/31/2014   ALKPHOS 84 10/31/2014   BILITOT 0.6 10/31/2014   GFRNONAA 48 (L) 10/09/2018   GFRAA 56 (L) 10/09/2018    Lab Results  Component Value Date   WBC 7.6 10/09/2018   HGB 16.5 10/09/2018   HCT 50.0 10/09/2018   MCV 92.3 10/09/2018   PLT 209 10/09/2018     STUDIES: Dg Chest Port 1 View  Result Date: 10/19/2018 CLINICAL DATA:  Left endobronchial ultrasound postop. EXAM: PORTABLE CHEST 1 VIEW COMPARISON:  10/20/2015 and chest CT 10/16/2018, PET-CT 10/08/2018 FINDINGS: Somewhat lordotic technique is demonstrated. Lungs are adequately inflated and demonstrate evidence of patient's known left upper lobe hypermetabolic nodule. There is mild prominence of the perihilar markings suggesting mild vascular congestion. No effusion or pneumothorax. Cardiomediastinal  silhouette and remainder the exam is unchanged. IMPRESSION: Minimal prominence of the perihilar markings suggesting mild vascular congestion. Known hypermetabolic left upper lobe nodule. Electronically Signed   By: Marin Olp M.D.   On: 10/19/2018 16:58   Dg C-arm 1-60 Min-no Report  Result Date: 10/19/2018 Fluoroscopy was utilized by the requesting physician.  No radiographic interpretation.    ASSESSMENT: Clinical stage IIB squamous cell carcinoma, left upper lobe lung.  PLAN:    1.Clinical stage IIB squamous cell carcinoma, left upper lobe lung: PET scan results from October 08, 2018 reviewed independently.  Patient underwent biopsy with navigational bronchoscopy on October 19, 2018 confirming diagnosis.  Patient has declined surgery and wished to pursue XRT along with concurrent chemotherapy.  Given his stage of disease he does not qualify for maintenance immunotherapy.  Plan to give weekly carboplatinum and Taxol throughout the duration of his XRT.  Return to clinic on November 26, 2020 initiate cycle 1.  2.  History of melanoma: Unclear stage or depth, although by report was in situ.  Patient had Mohs surgery in fall 2019.  Lung biopsy consistent with squamous cell carcinoma.  I spent a total of 30 minutes face-to-face with the patient of which greater than 50% of the visit was spent in counseling and coordination of care as detailed above.  Patient expressed understanding and was in agreement with this plan. He also understands that He can call clinic at any time with any questions, concerns, or complaints.   Cancer Staging Squamous cell carcinoma lung, left ([place]) Staging form: Lung, AJCC 8th Edition - Clinical stage from 10/27/2018: Stage IA3 (cT1c, cN0, cM0) - Signed by Lloyd Huger, MD on 10/27/2018   Lloyd Huger, MD   11/16/2018 1:05 PM

## 2018-11-19 ENCOUNTER — Other Ambulatory Visit: Payer: Self-pay

## 2018-11-19 ENCOUNTER — Inpatient Hospital Stay: Payer: No Typology Code available for payment source

## 2018-11-19 DIAGNOSIS — Z5111 Encounter for antineoplastic chemotherapy: Secondary | ICD-10-CM | POA: Diagnosis not present

## 2018-11-19 DIAGNOSIS — C3492 Malignant neoplasm of unspecified part of left bronchus or lung: Secondary | ICD-10-CM

## 2018-11-19 LAB — COMPREHENSIVE METABOLIC PANEL
ALT: 18 U/L (ref 0–44)
AST: 16 U/L (ref 15–41)
Albumin: 4 g/dL (ref 3.5–5.0)
Alkaline Phosphatase: 64 U/L (ref 38–126)
Anion gap: 8 (ref 5–15)
BUN: 12 mg/dL (ref 8–23)
CO2: 26 mmol/L (ref 22–32)
Calcium: 9.3 mg/dL (ref 8.9–10.3)
Chloride: 105 mmol/L (ref 98–111)
Creatinine, Ser: 1.45 mg/dL — ABNORMAL HIGH (ref 0.61–1.24)
GFR calc Af Amer: 54 mL/min — ABNORMAL LOW (ref >60)
GFR calc non Af Amer: 46 mL/min — ABNORMAL LOW (ref >60)
Glucose, Bld: 117 mg/dL — ABNORMAL HIGH (ref 70–99)
Potassium: 4.5 mmol/L (ref 3.5–5.1)
Sodium: 139 mmol/L (ref 135–145)
Total Bilirubin: 0.8 mg/dL (ref 0.3–1.2)
Total Protein: 7.4 g/dL (ref 6.5–8.1)

## 2018-11-19 LAB — CBC WITH DIFFERENTIAL/PLATELET
Abs Immature Granulocytes: 0.05 10*3/uL (ref 0.00–0.07)
Basophils Absolute: 0.1 10*3/uL (ref 0.0–0.1)
Basophils Relative: 1 %
Eosinophils Absolute: 0.1 10*3/uL (ref 0.0–0.5)
Eosinophils Relative: 2 %
HCT: 49.9 % (ref 39.0–52.0)
Hemoglobin: 16.3 g/dL (ref 13.0–17.0)
Immature Granulocytes: 1 %
Lymphocytes Relative: 23 %
Lymphs Abs: 1.7 10*3/uL (ref 0.7–4.0)
MCH: 30.5 pg (ref 26.0–34.0)
MCHC: 32.7 g/dL (ref 30.0–36.0)
MCV: 93.4 fL (ref 80.0–100.0)
Monocytes Absolute: 0.7 10*3/uL (ref 0.1–1.0)
Monocytes Relative: 9 %
Neutro Abs: 4.7 10*3/uL (ref 1.7–7.7)
Neutrophils Relative %: 64 %
Platelets: 183 10*3/uL (ref 150–400)
RBC: 5.34 MIL/uL (ref 4.22–5.81)
RDW: 13.2 % (ref 11.5–15.5)
WBC: 7.2 10*3/uL (ref 4.0–10.5)
nRBC: 0 % (ref 0.0–0.2)

## 2018-11-22 ENCOUNTER — Ambulatory Visit
Admission: RE | Admit: 2018-11-22 | Discharge: 2018-11-22 | Disposition: A | Discharge status: Home / Self Care | Payer: No Typology Code available for payment source | Source: Ambulatory Visit | Attending: Radiation Oncology | Admitting: Radiation Oncology

## 2018-11-22 ENCOUNTER — Other Ambulatory Visit: Payer: Self-pay

## 2018-11-23 ENCOUNTER — Other Ambulatory Visit: Payer: Self-pay | Admitting: Oncology

## 2018-11-25 DIAGNOSIS — Z7189 Other specified counseling: Secondary | ICD-10-CM | POA: Insufficient documentation

## 2018-11-25 NOTE — Progress Notes (Signed)
[place]  [phone] [phone]  ID: Levi [name] OB: 10-06-1940  MR#: [id number]  CBS#:[id number]  Patient Care Team: Blane Ohara, MD as PCP - General (Family Medicine) Telford Nab, RN as Registered Nurse  CHIEF COMPLAINT: Clinical stage IIB squamous cell carcinoma, left upper lobe lung.  INTERVAL HISTORY: Patient returns to clinic today for further evaluation and initiation of cycle 1 of weekly carboplatinum and Taxol.  He continues to be anxious, but otherwise feels well.  He has no neurologic complaints.  He denies any recent fevers or illnesses.  He has a good appetite and denies weight loss.  He has no chest pain, shortness of breath, cough, or hemoptysis.  He denies any nausea, vomiting, constipation, or diarrhea.  He has no urinary complaints.  Patient offers no further specific complaints today.  REVIEW OF SYSTEMS:   Review of Systems  Constitutional: Negative.  Negative for fever, malaise/fatigue and weight loss.  Respiratory: Negative.  Negative for cough, hemoptysis and shortness of breath.   Cardiovascular: Negative.  Negative for chest pain and leg swelling.  Gastrointestinal: Negative.  Negative for abdominal pain.  Genitourinary: Negative.  Negative for dysuria.  Musculoskeletal: Negative.  Negative for back pain.  Skin: Negative.  Negative for rash.  Neurological: Negative.  Negative for dizziness, focal weakness, weakness and headaches.  Psychiatric/Behavioral: The patient is nervous/anxious.     As per HPI. Otherwise, a complete review of systems is negative.  PAST MEDICAL HISTORY: Past Medical History:  Diagnosis Date  . Asthma   . COPD (chronic obstructive pulmonary disease) ([place])   . Hearing loss   . Hypothyroidism   . Mass of left lung   . 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 Prostate enlargement   . Shortness of breath   . Thyroid disease   . Tobacco abuse     PAST SURGICAL HISTORY: Past Surgical  History:  Procedure Laterality Date  . ELECTROMAGNETIC NAVIGATION BROCHOSCOPY Left 10/19/2018   Procedure: ELECTROMAGNETIC NAVIGATION BRONCHOSCOPY LEFT;  Surgeon: Tyler Pita, MD;  Location: [facility] ORS;  Service: Cardiopulmonary;  Laterality: Left;  . ENDOBRONCHIAL ULTRASOUND Left 10/19/2018   Procedure: ENDOBRONCHIAL ULTRASOUND LEFT;  Surgeon: Tyler Pita, MD;  Location: [facility] ORS;  Service: Cardiopulmonary;  Laterality: Left;  Marland Kitchen MELANOMA EXCISION Left   . NO PAST SURGERIES      FAMILY HISTORY: Family History  Problem Relation Age of Onset  . Aneurysm Mother   . Cancer Father     ADVANCED DIRECTIVES (Y/N):  N  HEALTH MAINTENANCE: Social History   Tobacco Use  . Smoking status: Former Smoker    Packs/day: 0.25    Years: 62.00    Pack years: 15.50    Types: Cigarettes    Quit date: 09/05/2018    Years since quitting: 0.2  . Smokeless tobacco: Never Used  Substance Use Topics  . Alcohol use: No    Alcohol/week: 0.0 standard drinks  . Drug use: No     Colonoscopy:  PAP:  Bone density:  Lipid panel:  No Known Allergies  Current Outpatient Medications  Medication Sig Dispense Refill  . albuterol (PROVENTIL HFA;VENTOLIN HFA) 108 (90 Base) MCG/ACT inhaler Inhale 2 puffs into the lungs every 6 (six) hours as needed for wheezing or shortness of breath. 1 Inhaler 0  . ALPRAZolam (XANAX) 0.25 MG tablet Take 1 tablet (0.25 mg total) by mouth 2 (two) times daily as needed for anxiety. 30 tablet 1  . aspirin 81  MG tablet Take 81 mg by mouth daily.    . budesonide-formoterol (SYMBICORT) 160-4.5 MCG/ACT inhaler Inhale 2 puffs into the lungs 2 (two) times daily.    Marland Kitchen levothyroxine (SYNTHROID) 137 MCG tablet Take 137 mcg by mouth daily before breakfast.    . ondansetron (ZOFRAN) 8 MG tablet Take 1 tablet (8 mg total) by mouth 2 (two) times daily as needed for refractory nausea / vomiting. Start on day 3 after chemo. 30 tablet 2  . prochlorperazine (COMPAZINE) 10 MG tablet  Take 1 tablet (10 mg total) by mouth every 6 (six) hours as needed (Nausea or vomiting). 60 tablet 2  . tamsulosin (FLOMAX) 0.4 MG CAPS capsule Take 1 capsule by mouth 1 day or 1 dose.     No current facility-administered medications for this visit.     OBJECTIVE: Vitals:   11/27/18 0843  BP: (!) 173/99  Pulse: 77  Temp: 98.3 F (36.8 C)     Body mass index is 34.87 kg/m.    ECOG FS:0 - Asymptomatic  General: Well-developed, well-nourished, no acute distress. Eyes: Pink conjunctiva, anicteric sclera. HEENT: Normocephalic, moist mucous membranes. Lungs: Clear to auscultation bilaterally. Heart: Regular rate and rhythm. No rubs, murmurs, or gallops. Abdomen: Soft, nontender, nondistended. No organomegaly noted, normoactive bowel sounds. Musculoskeletal: No edema, cyanosis, or clubbing. Neuro: Alert, answering all questions appropriately. Cranial nerves grossly intact. Skin: No rashes or petechiae noted. Psych: Normal affect.  LAB RESULTS:  Lab Results  Component Value Date   NA 139 11/27/2018   K 4.5 11/27/2018   CL 107 11/27/2018   CO2 23 11/27/2018   GLUCOSE 140 (H) 11/27/2018   BUN 20 11/27/2018   CREATININE 1.43 (H) 11/27/2018   CALCIUM 8.8 (L) 11/27/2018   PROT 7.1 11/27/2018   ALBUMIN 4.0 11/27/2018   AST 16 11/27/2018   ALT 18 11/27/2018   ALKPHOS 53 11/27/2018   BILITOT 0.8 11/27/2018   GFRNONAA 47 (L) 11/27/2018   GFRAA 55 (L) 11/27/2018    Lab Results  Component Value Date   WBC 7.1 11/27/2018   NEUTROABS 4.3 11/27/2018   HGB 15.8 11/27/2018   HCT 48.8 11/27/2018   MCV 92.6 11/27/2018   PLT 187 11/27/2018     STUDIES: No results found.  ASSESSMENT: Clinical stage IIB squamous cell carcinoma, left upper lobe lung.  PLAN:    1.Clinical stage IIB squamous cell carcinoma, left upper lobe lung: PET scan results from October 08, 2018 reviewed independently.  Patient underwent biopsy with navigational bronchoscopy on October 19, 2018 confirming diagnosis.   Patient declined surgery and wished to pursue XRT along with concurrent chemotherapy.  Given his stage of disease he does not qualify for maintenance immunotherapy.  Plan to give weekly carboplatinum and Taxol throughout the duration of his XRT.  Proceed with cycle 1 of carboplatinum and Taxol today.  Return to clinic in 1 week for further evaluation and consideration of cycle 2. 2.  History of melanoma: Unclear stage or depth, although by report was in situ.  Patient had Mohs surgery in fall 2019.  Lung biopsy consistent with squamous cell carcinoma. 3.  Anxiety: Continue Xanax as needed. 4.  Renal insufficiency: Patient's creatinine is 1.43 which is approximately his baseline, monitor.  Patient expressed understanding and was in agreement with this plan. He also understands that He can call clinic at any time with any questions, concerns, or complaints.   Cancer Staging Squamous cell carcinoma lung, left ([place]) Staging form: Lung, AJCC 8th Edition - Clinical  stage from 10/27/2018: Stage IIB (cT1c, cN1, cM0) - Signed by Lloyd Huger, MD on 11/16/2018   Lloyd Huger, MD   11/29/2018 6:24 AM

## 2018-11-26 ENCOUNTER — Ambulatory Visit
Admission: RE | Admit: 2018-11-26 | Discharge: 2018-11-26 | Disposition: A | Discharge status: Home / Self Care | Payer: No Typology Code available for payment source | Source: Ambulatory Visit | Attending: Radiation Oncology | Admitting: Radiation Oncology

## 2018-11-26 ENCOUNTER — Other Ambulatory Visit: Payer: Self-pay

## 2018-11-26 ENCOUNTER — Other Ambulatory Visit: Payer: Self-pay | Admitting: *Deleted

## 2018-11-26 DIAGNOSIS — Z5111 Encounter for antineoplastic chemotherapy: Secondary | ICD-10-CM | POA: Diagnosis not present

## 2018-11-27 ENCOUNTER — Inpatient Hospital Stay: Payer: No Typology Code available for payment source

## 2018-11-27 ENCOUNTER — Encounter: Payer: Self-pay | Admitting: *Deleted

## 2018-11-27 ENCOUNTER — Inpatient Hospital Stay (HOSPITAL_BASED_OUTPATIENT_CLINIC_OR_DEPARTMENT_OTHER): Payer: No Typology Code available for payment source | Admitting: Oncology

## 2018-11-27 ENCOUNTER — Other Ambulatory Visit: Payer: Self-pay

## 2018-11-27 ENCOUNTER — Ambulatory Visit
Admission: RE | Admit: 2018-11-27 | Discharge: 2018-11-27 | Disposition: A | Discharge status: Home / Self Care | Payer: No Typology Code available for payment source | Source: Ambulatory Visit | Attending: Radiation Oncology | Admitting: Radiation Oncology

## 2018-11-27 ENCOUNTER — Encounter: Payer: Self-pay | Admitting: Oncology

## 2018-11-27 VITALS — BP 173/99 | HR 77 | Temp 98.3°F | Ht 71.0 in | Wt 250.0 lb

## 2018-11-27 VITALS — BP 138/77 | HR 67 | Resp 18

## 2018-11-27 DIAGNOSIS — Z85820 Personal history of malignant melanoma of skin: Secondary | ICD-10-CM | POA: Diagnosis not present

## 2018-11-27 DIAGNOSIS — C3492 Malignant neoplasm of unspecified part of left bronchus or lung: Secondary | ICD-10-CM

## 2018-11-27 DIAGNOSIS — Z87891 Personal history of nicotine dependence: Secondary | ICD-10-CM | POA: Diagnosis not present

## 2018-11-27 DIAGNOSIS — E039 Hypothyroidism, unspecified: Secondary | ICD-10-CM | POA: Diagnosis not present

## 2018-11-27 DIAGNOSIS — Z7189 Other specified counseling: Secondary | ICD-10-CM

## 2018-11-27 DIAGNOSIS — C3412 Malignant neoplasm of upper lobe, left bronchus or lung: Secondary | ICD-10-CM | POA: Diagnosis not present

## 2018-11-27 DIAGNOSIS — Z5111 Encounter for antineoplastic chemotherapy: Secondary | ICD-10-CM | POA: Diagnosis not present

## 2018-11-27 DIAGNOSIS — J449 Chronic obstructive pulmonary disease, unspecified: Secondary | ICD-10-CM

## 2018-11-27 DIAGNOSIS — F419 Anxiety disorder, unspecified: Secondary | ICD-10-CM

## 2018-11-27 LAB — CBC WITH DIFFERENTIAL/PLATELET
Abs Immature Granulocytes: 0.03 10*3/uL (ref 0.00–0.07)
Basophils Absolute: 0.1 10*3/uL (ref 0.0–0.1)
Basophils Relative: 1 %
Eosinophils Absolute: 0.2 10*3/uL (ref 0.0–0.5)
Eosinophils Relative: 3 %
HCT: 48.8 % (ref 39.0–52.0)
Hemoglobin: 15.8 g/dL (ref 13.0–17.0)
Immature Granulocytes: 0 %
Lymphocytes Relative: 25 %
Lymphs Abs: 1.8 10*3/uL (ref 0.7–4.0)
MCH: 30 pg (ref 26.0–34.0)
MCHC: 32.4 g/dL (ref 30.0–36.0)
MCV: 92.6 fL (ref 80.0–100.0)
Monocytes Absolute: 0.8 10*3/uL (ref 0.1–1.0)
Monocytes Relative: 11 %
Neutro Abs: 4.3 10*3/uL (ref 1.7–7.7)
Neutrophils Relative %: 60 %
Platelets: 187 10*3/uL (ref 150–400)
RBC: 5.27 MIL/uL (ref 4.22–5.81)
RDW: 13.3 % (ref 11.5–15.5)
WBC: 7.1 10*3/uL (ref 4.0–10.5)
nRBC: 0 % (ref 0.0–0.2)

## 2018-11-27 LAB — COMPREHENSIVE METABOLIC PANEL
ALT: 18 U/L (ref 0–44)
AST: 16 U/L (ref 15–41)
Albumin: 4 g/dL (ref 3.5–5.0)
Alkaline Phosphatase: 53 U/L (ref 38–126)
Anion gap: 9 (ref 5–15)
BUN: 20 mg/dL (ref 8–23)
CO2: 23 mmol/L (ref 22–32)
Calcium: 8.8 mg/dL — ABNORMAL LOW (ref 8.9–10.3)
Chloride: 107 mmol/L (ref 98–111)
Creatinine, Ser: 1.43 mg/dL — ABNORMAL HIGH (ref 0.61–1.24)
GFR calc Af Amer: 55 mL/min — ABNORMAL LOW (ref >60)
GFR calc non Af Amer: 47 mL/min — ABNORMAL LOW (ref >60)
Glucose, Bld: 140 mg/dL — ABNORMAL HIGH (ref 70–99)
Potassium: 4.5 mmol/L (ref 3.5–5.1)
Sodium: 139 mmol/L (ref 135–145)
Total Bilirubin: 0.8 mg/dL (ref 0.3–1.2)
Total Protein: 7.1 g/dL (ref 6.5–8.1)

## 2018-11-27 MED ORDER — DIPHENHYDRAMINE HCL 50 MG/ML IJ SOLN
25.0000 mg | Freq: Once | INTRAMUSCULAR | Status: AC
  Administered 2018-11-27: 11:00:00 25 mg via INTRAVENOUS
  Filled 2018-11-27: qty 1

## 2018-11-27 MED ORDER — FAMOTIDINE IN NACL 20-0.9 MG/50ML-% IV SOLN
20.0000 mg | Freq: Once | INTRAVENOUS | Status: AC
  Administered 2018-11-27: 20 mg via INTRAVENOUS
  Filled 2018-11-27: qty 50

## 2018-11-27 MED ORDER — SODIUM CHLORIDE 0.9 % IV SOLN
45.0000 mg/m2 | Freq: Once | INTRAVENOUS | Status: AC
  Administered 2018-11-27: 12:00:00 108 mg via INTRAVENOUS
  Filled 2018-11-27: qty 18

## 2018-11-27 MED ORDER — SODIUM CHLORIDE 0.9 % IV SOLN
189.8000 mg | Freq: Once | INTRAVENOUS | Status: AC
  Administered 2018-11-27: 190 mg via INTRAVENOUS
  Filled 2018-11-27: qty 19

## 2018-11-27 MED ORDER — DEXAMETHASONE SODIUM PHOSPHATE 10 MG/ML IJ SOLN
10.0000 mg | Freq: Once | INTRAMUSCULAR | Status: AC
  Administered 2018-11-27: 10 mg via INTRAVENOUS
  Filled 2018-11-27: qty 1

## 2018-11-27 MED ORDER — PALONOSETRON HCL INJECTION 0.25 MG/5ML
0.2500 mg | Freq: Once | INTRAVENOUS | Status: AC
  Administered 2018-11-27: 0.25 mg via INTRAVENOUS
  Filled 2018-11-27: qty 5

## 2018-11-27 MED ORDER — SODIUM CHLORIDE 0.9 % IV SOLN
Freq: Once | INTRAVENOUS | Status: AC
  Administered 2018-11-27: 11:00:00 via INTRAVENOUS
  Filled 2018-11-27: qty 250

## 2018-11-27 NOTE — Progress Notes (Signed)
Patient stated that he is nervous to start chemo today.

## 2018-11-27 NOTE — Progress Notes (Signed)
Pt tolerated first time infusion of taxol and carboplatin well today with no signs of complications or reaction. Pt was educated that if any complications arises at home  to call 911 in case of an emergency. VSS. Pt verbalizes understanding. All questions answered at this time.   Levi [name] [employer]

## 2018-11-27 NOTE — Progress Notes (Signed)
[place]  [phone] [phone]  Patient Care Team: Blane Ohara, MD as PCP - General (Family Medicine) Telford Nab, RN as Registered Nurse   Name of the patient: Levi [name]  [id number]  [birth year]   Date of visit: 11/27/18  Diagnosis-stage IIIb squamous cell carcinoma left upper lobe  Chief complaint/Reason for visit- Initial Meeting for [facility], preparing for starting chemotherapy  Heme/Onc history:  Oncology History  Squamous cell carcinoma lung, left ([place])  10/27/2018 Initial Diagnosis   Squamous cell carcinoma lung, left ([place])   10/27/2018 Cancer Staging   Staging form: Lung, AJCC 8th Edition - Clinical stage from 10/27/2018: Stage IIB (cT1c, cN1, cM0) - Signed by Lloyd Huger, MD on 11/16/2018   11/27/2018 -  Chemotherapy   The patient had palonosetron (ALOXI) injection 0.25 mg, 0.25 mg, Intravenous,  Once, 1 of 8 cycles CARBOplatin (PARAPLATIN) 190 mg in sodium chloride 0.9 % 100 mL chemo infusion, 190 mg (101 % of original dose 188 mg), Intravenous,  Once, 1 of 8 cycles Dose modification:   (original dose 188 mg, Cycle 1) PACLitaxel (TAXOL) 108 mg in sodium chloride 0.9 % 250 mL chemo infusion (</= 80mg /m2), 45 mg/m2 = 108 mg, Intravenous,  Once, 1 of 8 cycles  for chemotherapy treatment.      Interval history-Levi [name] is a 78 year old male who presents to chemo care clinic today for initial meeting in preparation for starting chemotherapy. I introduced the chemo care clinic and we discussed that the role of the clinic is to assist those who are at an increased risk of emergency room visits and/or complications during the course of chemotherapy treatment. We discussed that the increased risk takes into account factors such as age, performance status, and co-morbidities. We also discussed that for some, this might include barriers to care such as not having a primary care  provider, lack of insurance/transportation, or not being able to afford medications. We discussed that the goal of the program is to help prevent unplanned ER visits and help reduce complications during chemotherapy. We do this by discussing specific risk factors to each individual and identifying ways that we can help improve these risk factors and reduce barriers to care.   ECOG FS:1 - Symptomatic but completely ambulatory  Review of systems- Review of Systems  Constitutional: Positive for malaise/fatigue. Negative for chills, fever and weight loss.  HENT: Negative for congestion, ear pain and tinnitus.   Eyes: Negative.  Negative for blurred vision and double vision.  Respiratory: Positive for shortness of breath. Negative for cough and sputum production.   Cardiovascular: Negative.  Negative for chest pain, palpitations and leg swelling.  Gastrointestinal: Negative.  Negative for abdominal pain, constipation, diarrhea, nausea and vomiting.  Genitourinary: Negative for dysuria, frequency and urgency.  Musculoskeletal: Negative for back pain and falls.  Skin: Negative.  Negative for rash.  Neurological: Negative.  Negative for weakness and headaches.  Endo/Heme/Allergies: Negative.  Does not bruise/bleed easily.  Psychiatric/Behavioral: Negative for depression. The patient is nervous/anxious. The patient does not have insomnia.      Current treatment-concurrent chemoradiation.  Cycle 1 scheduled today with carbo/Taxol.  No Known Allergies  Past Medical History:  Diagnosis Date  . Asthma   . COPD (chronic obstructive pulmonary disease) ([place])   . Hearing loss   . Hypothyroidism   . Mass of left lung   . 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 Prostate enlargement   .  Shortness of breath   . Thyroid disease   . Tobacco abuse     Past Surgical History:  Procedure Laterality Date  . ELECTROMAGNETIC NAVIGATION BROCHOSCOPY Left 10/19/2018   Procedure: ELECTROMAGNETIC NAVIGATION  BRONCHOSCOPY LEFT;  Surgeon: Tyler Pita, MD;  Location: [facility] ORS;  Service: Cardiopulmonary;  Laterality: Left;  . ENDOBRONCHIAL ULTRASOUND Left 10/19/2018   Procedure: ENDOBRONCHIAL ULTRASOUND LEFT;  Surgeon: Tyler Pita, MD;  Location: [facility] ORS;  Service: Cardiopulmonary;  Laterality: Left;  Marland Kitchen MELANOMA EXCISION Left   . NO PAST SURGERIES      Social History   Socioeconomic History  . Marital status: Married    Spouse name: Not on file  . Number of children: Not on file  . Years of education: Not on file  . Highest education level: Not on file  Occupational History  . Not on file  Social Needs  . Financial resource strain: Not on file  . Food insecurity    Worry: Not on file    Inability: Not on file  . Transportation needs    Medical: Not on file    Non-medical: Not on file  Tobacco Use  . Smoking status: Former Smoker    Packs/day: 0.25    Years: 62.00    Pack years: 15.50    Types: Cigarettes    Quit date: 09/05/2018    Years since quitting: 0.2  . Smokeless tobacco: Never Used  Substance and Sexual Activity  . Alcohol use: No    Alcohol/week: 0.0 standard drinks  . Drug use: No  . Sexual activity: Not on file  Lifestyle  . Physical activity    Days per week: Not on file    Minutes per session: Not on file  . Stress: Not on file  Relationships  . Social Herbalist on phone: Not on file    Gets together: Not on file    Attends religious service: Not on file    Active member of club or organization: Not on file    Attends meetings of clubs or organizations: Not on file    Relationship status: Not on file  . Intimate partner violence    Fear of current or ex partner: Not on file    Emotionally abused: Not on file    Physically abused: Not on file    Forced sexual activity: Not on file  Other Topics Concern  . Not on file  Social History Narrative  . Not on file    Family History  Problem Relation Age of Onset  . Aneurysm Mother    . Cancer Father      Current Outpatient Medications:  .  albuterol (PROVENTIL HFA;VENTOLIN HFA) 108 (90 Base) MCG/ACT inhaler, Inhale 2 puffs into the lungs every 6 (six) hours as needed for wheezing or shortness of breath., Disp: 1 Inhaler, Rfl: 0 .  ALPRAZolam (XANAX) 0.25 MG tablet, Take 1 tablet (0.25 mg total) by mouth 2 (two) times daily as needed for anxiety., Disp: 30 tablet, Rfl: 1 .  aspirin 81 MG tablet, Take 81 mg by mouth daily., Disp: , Rfl:  .  budesonide-formoterol (SYMBICORT) 160-4.5 MCG/ACT inhaler, Inhale 2 puffs into the lungs 2 (two) times daily., Disp: , Rfl:  .  levothyroxine (SYNTHROID) 137 MCG tablet, Take 137 mcg by mouth daily before breakfast., Disp: , Rfl:  .  ondansetron (ZOFRAN) 8 MG tablet, Take 1 tablet (8 mg total) by mouth 2 (two) times daily as needed for refractory  nausea / vomiting. Start on day 3 after chemo., Disp: 30 tablet, Rfl: 2 .  prochlorperazine (COMPAZINE) 10 MG tablet, Take 1 tablet (10 mg total) by mouth every 6 (six) hours as needed (Nausea or vomiting)., Disp: 60 tablet, Rfl: 2 .  tamsulosin (FLOMAX) 0.4 MG CAPS capsule, Take 1 capsule by mouth 1 day or 1 dose., Disp: , Rfl:  No current facility-administered medications for this visit.   Facility-Administered Medications Ordered in Other Visits:  .  CARBOplatin (PARAPLATIN) 190 mg in sodium chloride 0.9 % 100 mL chemo infusion, 190 mg, Intravenous, Once, Finnegan, Kathlene November, MD .  PACLitaxel (TAXOL) 108 mg in sodium chloride 0.9 % 250 mL chemo infusion (</= 80mg /m2), 45 mg/m2 (Treatment Plan Recorded), Intravenous, Once, Lloyd Huger, MD, Last Rate: 268 mL/hr at 11/27/18 1139, 108 mg at 11/27/18 1139  Physical exam: There were no vitals filed for this visit. Physical Exam Constitutional:      Appearance: Normal appearance.  HENT:     Head: Normocephalic and atraumatic.  Eyes:     Pupils: Pupils are equal, round, and reactive to light.  Neck:     Musculoskeletal: Normal range of  motion.  Cardiovascular:     Rate and Rhythm: Normal rate and regular rhythm.     Heart sounds: Normal heart sounds. No murmur.  Pulmonary:     Effort: Pulmonary effort is normal.     Breath sounds: Normal breath sounds. No wheezing.  Abdominal:     General: Bowel sounds are normal. There is no distension.     Palpations: Abdomen is soft.     Tenderness: There is no abdominal tenderness.  Musculoskeletal: Normal range of motion.  Skin:    General: Skin is warm and dry.     Findings: No rash.  Neurological:     Mental Status: He is alert and oriented to person, place, and time.  Psychiatric:        Judgment: Judgment normal.      CMP Latest Ref Rng & Units 11/27/2018  Glucose 70 - 99 mg/dL 140(H)  BUN 8 - 23 mg/dL 20  Creatinine 0.61 - 1.24 mg/dL 1.43(H)  Sodium 135 - 145 mmol/L 139  Potassium 3.5 - 5.1 mmol/L 4.5  Chloride 98 - 111 mmol/L 107  CO2 22 - 32 mmol/L 23  Calcium 8.9 - 10.3 mg/dL 8.8(L)  Total Protein 6.5 - 8.1 g/dL 7.1  Total Bilirubin 0.3 - 1.2 mg/dL 0.8  Alkaline Phos 38 - 126 U/L 53  AST 15 - 41 U/L 16  ALT 0 - 44 U/L 18   CBC Latest Ref Rng & Units 11/27/2018  WBC 4.0 - 10.5 K/uL 7.1  Hemoglobin 13.0 - 17.0 g/dL 15.8  Hematocrit 39.0 - 52.0 % 48.8  Platelets 150 - 400 K/uL 187    No images are attached to the encounter.  No results found.   Assessment and plan- Patient is a 78 y.o. male who presents to [facility] for initial meeting in preparation for starting chemotherapy for the treatment of concurrent chemoradiation with carbo/Taxol.   Cancer-stage IIb squamous cell carcinoma of left upper lobe.  Initial diagnosis discovered with low-dose CT screening back in May 2020 which revealed a suspicious left upper lobe pulmonary nodule.  PET scan revealed hypermetabolic left upper lobe spiculated concerning for bronchogenic carcinoma.  At bronchoscopy confirming diagnosis.  He was found not to be a good candidate for left pneumonectomy but could  possibly tolerate a lobectomy.  He declined.  Chemo Care Clinic/High Risk for ER/Hospitalization during chemotherapy- We discussed the role of the chemo care clinic and identified patient specific risk factors. I discussed that patient was identified as high risk primarily based on: Co morbidities and stage of disease.  Past medical history of COPD, hypothyroidism, melanoma, enlarged prostate and tobacco abuse.  He is followed by Dr. Nathanial Rancher his PCP.  He is followed by the [place] in [place].  Records unavailable to review.  He lives at home with his wife.  He has 5 children total who live locally and are involved in his care.  He drives himself to and from the cancer center for his appointments.  He denies any financial concerns at this time.  Social Determinants of Health- we discussed that social determinants of health may have significant impacts on health and outcomes for cancer patients.  Today we discussed specific social determinants of performance status, alcohol use, depression, financial needs, food insecurity, housing, interpersonal violence, social connections, stress, tobacco use, and transportation.  After lengthy discussion the following were identified as areas of need:   Anxiety/Depression: we discussed self-referral to [facility] for counseling services, psychiatry for medication management, or palliative care/symptom management as well as primary care providers.  He was recently placed on Xanax 0.25 by mouth 2 times daily which he states is helping.  Based on concern of stress-we discussed options for managing stress including healthy eating, exercise as well as participating in no charge counseling services at the cancer center and support groups.  If these are of interest, patient can notify either myself or primary nursing team.  Based on tobacco use-smoking cessation was encouraged.  We discussed options for management including medications and referral to quit Smart program   Co-morbidities Complicating Care: Comorbidities appear to be under control.  He was recently started on Xanax 0.25 mg 2 times daily for anxiety.  Palliative care referral placed.   We also discussed the role of the Symptom Management Clinic at [facility] for acute issues and methods of contacting clinic/provider. He denies needing specific assistance at this time and He will be followed by Burgess Estelle and Telford Nab, RN (Nurse Navigator).   Visit Diagnosis 1. Squamous cell carcinoma lung, left ([facility])      Patient expressed understanding and was in agreement with this plan. He also understands that He can call clinic at any time with any questions, concerns, or complaints.   A total of (25) minutes of face-to-face time was spent with this patient with greater than 50% of that time in counseling and care-coordination.  Faythe Casa, NP, AGNP-C [facility] (work cell) [phone] (office)  CC: Dr. Grayland Ormond

## 2018-11-27 NOTE — Progress Notes (Signed)
  Oncology Nurse Navigator Documentation  Navigator Location: [facility]Med Onc (11/27/18 1300)   )Navigator Encounter Type: Treatment (11/27/18 1300)                   Treatment Initiated Date: 11/26/18 (11/27/18 1300) Patient Visit Type: MedOnc (11/27/18 1300) Treatment Phase: First Chemo Tx (11/27/18 1300) Barriers/Navigation Needs: No Barriers At This Time (11/27/18 1300)   Interventions: None Required (11/27/18 1300)                      Time Spent with Patient: 30 (11/27/18 1300)

## 2018-11-28 ENCOUNTER — Ambulatory Visit
Admission: RE | Admit: 2018-11-28 | Discharge: 2018-11-28 | Disposition: A | Discharge status: Home / Self Care | Payer: No Typology Code available for payment source | Source: Ambulatory Visit | Attending: Radiation Oncology | Admitting: Radiation Oncology

## 2018-11-28 ENCOUNTER — Telehealth: Payer: Self-pay

## 2018-11-28 ENCOUNTER — Other Ambulatory Visit: Payer: Self-pay

## 2018-11-28 DIAGNOSIS — Z5111 Encounter for antineoplastic chemotherapy: Secondary | ICD-10-CM | POA: Diagnosis not present

## 2018-11-28 NOTE — Telephone Encounter (Signed)
Telephone call to patient to follow up after receiving first chemotherapy infusion yesterday.  Patient states feeling good.  Has no complaints.  Encouraged patient to call for any questions or concerns.  Patient verbalized understanding.

## 2018-11-29 ENCOUNTER — Ambulatory Visit
Admission: RE | Admit: 2018-11-29 | Discharge: 2018-11-29 | Disposition: A | Discharge status: Home / Self Care | Payer: No Typology Code available for payment source | Source: Ambulatory Visit | Attending: Radiation Oncology | Admitting: Radiation Oncology

## 2018-11-29 ENCOUNTER — Other Ambulatory Visit: Payer: Self-pay

## 2018-11-29 DIAGNOSIS — Z5111 Encounter for antineoplastic chemotherapy: Secondary | ICD-10-CM | POA: Diagnosis not present

## 2018-11-30 ENCOUNTER — Other Ambulatory Visit: Payer: Self-pay

## 2018-11-30 ENCOUNTER — Ambulatory Visit
Admission: RE | Admit: 2018-11-30 | Discharge: 2018-11-30 | Disposition: A | Discharge status: Home / Self Care | Payer: No Typology Code available for payment source | Source: Ambulatory Visit | Attending: Radiation Oncology | Admitting: Radiation Oncology

## 2018-11-30 DIAGNOSIS — Z5111 Encounter for antineoplastic chemotherapy: Secondary | ICD-10-CM | POA: Diagnosis not present

## 2018-12-02 NOTE — Progress Notes (Signed)
[place]  [phone] Fax:([phone]  ID: Levi [name] OB: [birth year]  MR#: [id number]  JSE#:[id number]  Patient Care Team: Blane Ohara, MD as PCP - General (Family Medicine) Telford Nab, RN as Registered Nurse  CHIEF COMPLAINT: Clinical stage IIB squamous cell carcinoma, left upper lobe lung.  INTERVAL HISTORY: Patient returns to clinic today for further evaluation and consideration of cycle 2 of weekly carboplatinum and Taxol.  He tolerated his first treatment well without significant side effects.  He continues to be highly anxious.  He has no neurologic complaints.  He denies any recent fevers or illnesses.  He has a good appetite and denies weight loss.  He has no chest pain, shortness of breath, cough, or hemoptysis.  He denies any nausea, vomiting, constipation, or diarrhea.  He has no urinary complaints.  Patient offers no further specific complaints today.  REVIEW OF SYSTEMS:   Review of Systems  Constitutional: Negative.  Negative for fever, malaise/fatigue and weight loss.  HENT: Positive for congestion.   Respiratory: Negative.  Negative for cough, hemoptysis and shortness of breath.   Cardiovascular: Negative.  Negative for chest pain and leg swelling.  Gastrointestinal: Negative.  Negative for abdominal pain.  Genitourinary: Negative.  Negative for dysuria.  Musculoskeletal: Negative.  Negative for back pain.  Skin: Negative.  Negative for rash.  Neurological: Negative.  Negative for dizziness, focal weakness, weakness and headaches.  Psychiatric/Behavioral: The patient is nervous/anxious.     As per HPI. Otherwise, a complete review of systems is negative.  PAST MEDICAL HISTORY: Past Medical History:  Diagnosis Date  . Asthma   . COPD (chronic obstructive pulmonary disease) ([place])   . Hearing loss   . Hypothyroidism   . Mass of left lung   . 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 Prostate enlargement   . Shortness of  breath   . Thyroid disease   . Tobacco abuse     PAST SURGICAL HISTORY: Past Surgical History:  Procedure Laterality Date  . ELECTROMAGNETIC NAVIGATION BROCHOSCOPY Left 10/19/2018   Procedure: ELECTROMAGNETIC NAVIGATION BRONCHOSCOPY LEFT;  Surgeon: Tyler Pita, MD;  Location: [facility] ORS;  Service: Cardiopulmonary;  Laterality: Left;  . ENDOBRONCHIAL ULTRASOUND Left 10/19/2018   Procedure: ENDOBRONCHIAL ULTRASOUND LEFT;  Surgeon: Tyler Pita, MD;  Location: [facility] ORS;  Service: Cardiopulmonary;  Laterality: Left;  Marland Kitchen MELANOMA EXCISION Left   . NO PAST SURGERIES      FAMILY HISTORY: Family History  Problem Relation Age of Onset  . Aneurysm Mother   . Cancer Father     ADVANCED DIRECTIVES (Y/N):  N  HEALTH MAINTENANCE: Social History   Tobacco Use  . Smoking status: Former Smoker    Packs/day: 0.25    Years: 62.00    Pack years: 15.50    Types: Cigarettes    Quit date: 09/05/2018    Years since quitting: 0.2  . Smokeless tobacco: Never Used  Substance Use Topics  . Alcohol use: No    Alcohol/week: 0.0 standard drinks  . Drug use: No     Colonoscopy:  PAP:  Bone density:  Lipid panel:  No Known Allergies  Current Outpatient Medications  Medication Sig Dispense Refill  . albuterol (PROVENTIL HFA;VENTOLIN HFA) 108 (90 Base) MCG/ACT inhaler Inhale 2 puffs into the lungs every 6 (six) hours as needed for wheezing or shortness of breath. 1 Inhaler 0  . ALPRAZolam (XANAX) 0.25 MG tablet Take 1 tablet (0.25 mg total) by mouth 2 (  two) times daily as needed for anxiety. 30 tablet 1  . aspirin 81 MG tablet Take 81 mg by mouth daily.    . budesonide-formoterol (SYMBICORT) 160-4.5 MCG/ACT inhaler Inhale 2 puffs into the lungs 2 (two) times daily.    Marland Kitchen levothyroxine (SYNTHROID) 137 MCG tablet Take 137 mcg by mouth daily before breakfast.    . ondansetron (ZOFRAN) 8 MG tablet Take 1 tablet (8 mg total) by mouth 2 (two) times daily as needed for refractory nausea /  vomiting. Start on day 3 after chemo. 30 tablet 2  . prochlorperazine (COMPAZINE) 10 MG tablet Take 1 tablet (10 mg total) by mouth every 6 (six) hours as needed (Nausea or vomiting). 60 tablet 2  . tamsulosin (FLOMAX) 0.4 MG CAPS capsule Take 1 capsule by mouth 1 day or 1 dose.     No current facility-administered medications for this visit.     OBJECTIVE: Vitals:   12/04/18 0925  BP: (!) 153/91  Pulse: 78  Temp: (!) 97.4 F (36.3 C)     Body mass index is 34.64 kg/m.    ECOG FS:0 - Asymptomatic  General: Well-developed, well-nourished, no acute distress. Eyes: Pink conjunctiva, anicteric sclera. HEENT: Normocephalic, moist mucous membranes. Lungs: Clear to auscultation bilaterally. Heart: Regular rate and rhythm. No rubs, murmurs, or gallops. Abdomen: Soft, nontender, nondistended. No organomegaly noted, normoactive bowel sounds. Musculoskeletal: No edema, cyanosis, or clubbing. Neuro: Alert, answering all questions appropriately. Cranial nerves grossly intact. Skin: No rashes or petechiae noted. Psych: Normal affect.  LAB RESULTS:  Lab Results  Component Value Date   NA 136 12/04/2018   K 4.3 12/04/2018   CL 105 12/04/2018   CO2 24 12/04/2018   GLUCOSE 123 (H) 12/04/2018   BUN 16 12/04/2018   CREATININE 1.68 (H) 12/04/2018   CALCIUM 9.4 12/04/2018   PROT 7.3 12/04/2018   ALBUMIN 4.1 12/04/2018   AST 21 12/04/2018   ALT 23 12/04/2018   ALKPHOS 58 12/04/2018   BILITOT 0.7 12/04/2018   GFRNONAA 39 (L) 12/04/2018   GFRAA 45 (L) 12/04/2018    Lab Results  Component Value Date   WBC 5.4 12/04/2018   NEUTROABS 3.8 12/04/2018   HGB 15.5 12/04/2018   HCT 47.1 12/04/2018   MCV 92.7 12/04/2018   PLT 189 12/04/2018     STUDIES: No results found.  ASSESSMENT: Clinical stage IIB squamous cell carcinoma, left upper lobe lung.  PLAN:    1.Clinical stage IIB squamous cell carcinoma, left upper lobe lung: PET scan results from October 08, 2018 reviewed independently.   Patient underwent biopsy with navigational bronchoscopy on October 19, 2018 confirming diagnosis.  Patient declined surgery and wished to pursue XRT along with concurrent chemotherapy.  Given his stage of disease he does not qualify for maintenance immunotherapy.  Plan to give weekly carboplatinum and Taxol throughout the duration of his XRT.  Continue daily XRT.  Proceed with cycle 2 of weekly carboplatinum and Taxol.  Return to clinic in 1 week for further evaluation and consideration of cycle 3.  2.  History of melanoma: Unclear stage or depth, although by report was in situ.  Patient had Mohs surgery in fall 2019.  Lung biopsy consistent with squamous cell carcinoma. 3.  Anxiety: Continue Xanax as needed. 4.  Renal insufficiency: Patient's creatinine has trended up to 1.68 likely secondary to poor PO fluid intake.  Patient will receive an additional 1 L of fluids along with his treatment today. 5.  Sinus congestion: Okay to take OTC  remedies as needed.  Patient expressed understanding and was in agreement with this plan. He also understands that He can call clinic at any time with any questions, concerns, or complaints.   Cancer Staging Squamous cell carcinoma lung, left ([place]) Staging form: Lung, AJCC 8th Edition - Clinical stage from 10/27/2018: Stage IIB (cT1c, cN1, cM0) - Signed by Lloyd Huger, MD on 11/16/2018   Lloyd Huger, MD   12/05/2018 6:54 AM

## 2018-12-03 ENCOUNTER — Other Ambulatory Visit: Payer: Self-pay

## 2018-12-03 ENCOUNTER — Ambulatory Visit
Admission: RE | Admit: 2018-12-03 | Discharge: 2018-12-03 | Disposition: A | Discharge status: Home / Self Care | Payer: No Typology Code available for payment source | Source: Ambulatory Visit | Attending: Radiation Oncology | Admitting: Radiation Oncology

## 2018-12-03 DIAGNOSIS — Z510 Encounter for antineoplastic radiation therapy: Secondary | ICD-10-CM | POA: Insufficient documentation

## 2018-12-03 DIAGNOSIS — F17210 Nicotine dependence, cigarettes, uncomplicated: Secondary | ICD-10-CM | POA: Insufficient documentation

## 2018-12-03 DIAGNOSIS — C3412 Malignant neoplasm of upper lobe, left bronchus or lung: Secondary | ICD-10-CM | POA: Insufficient documentation

## 2018-12-03 DIAGNOSIS — Z5111 Encounter for antineoplastic chemotherapy: Secondary | ICD-10-CM | POA: Diagnosis not present

## 2018-12-04 ENCOUNTER — Encounter: Payer: Self-pay | Admitting: Nurse Practitioner

## 2018-12-04 ENCOUNTER — Other Ambulatory Visit: Payer: Self-pay

## 2018-12-04 ENCOUNTER — Inpatient Hospital Stay: Payer: No Typology Code available for payment source | Attending: Oncology

## 2018-12-04 ENCOUNTER — Inpatient Hospital Stay (HOSPITAL_BASED_OUTPATIENT_CLINIC_OR_DEPARTMENT_OTHER): Payer: No Typology Code available for payment source | Admitting: Oncology

## 2018-12-04 ENCOUNTER — Inpatient Hospital Stay: Payer: No Typology Code available for payment source | Admitting: Nurse Practitioner

## 2018-12-04 ENCOUNTER — Encounter: Payer: Self-pay | Admitting: Oncology

## 2018-12-04 ENCOUNTER — Ambulatory Visit
Admission: RE | Admit: 2018-12-04 | Discharge: 2018-12-04 | Disposition: A | Discharge status: Home / Self Care | Payer: No Typology Code available for payment source | Source: Ambulatory Visit | Attending: Radiation Oncology | Admitting: Radiation Oncology

## 2018-12-04 ENCOUNTER — Inpatient Hospital Stay: Payer: No Typology Code available for payment source

## 2018-12-04 VITALS — BP 153/91 | HR 78 | Temp 97.4°F | Ht 71.0 in | Wt 248.4 lb

## 2018-12-04 DIAGNOSIS — C3492 Malignant neoplasm of unspecified part of left bronchus or lung: Secondary | ICD-10-CM

## 2018-12-04 DIAGNOSIS — Z515 Encounter for palliative care: Secondary | ICD-10-CM

## 2018-12-04 DIAGNOSIS — Z5111 Encounter for antineoplastic chemotherapy: Secondary | ICD-10-CM | POA: Diagnosis not present

## 2018-12-04 DIAGNOSIS — C3412 Malignant neoplasm of upper lobe, left bronchus or lung: Secondary | ICD-10-CM | POA: Insufficient documentation

## 2018-12-04 LAB — CBC WITH DIFFERENTIAL/PLATELET
Abs Immature Granulocytes: 0.04 10*3/uL (ref 0.00–0.07)
Basophils Absolute: 0 10*3/uL (ref 0.0–0.1)
Basophils Relative: 1 %
Eosinophils Absolute: 0.1 10*3/uL (ref 0.0–0.5)
Eosinophils Relative: 3 %
HCT: 47.1 % (ref 39.0–52.0)
Hemoglobin: 15.5 g/dL (ref 13.0–17.0)
Immature Granulocytes: 1 %
Lymphocytes Relative: 21 %
Lymphs Abs: 1.2 10*3/uL (ref 0.7–4.0)
MCH: 30.5 pg (ref 26.0–34.0)
MCHC: 32.9 g/dL (ref 30.0–36.0)
MCV: 92.7 fL (ref 80.0–100.0)
Monocytes Absolute: 0.3 10*3/uL (ref 0.1–1.0)
Monocytes Relative: 5 %
Neutro Abs: 3.8 10*3/uL (ref 1.7–7.7)
Neutrophils Relative %: 69 %
Platelets: 189 10*3/uL (ref 150–400)
RBC: 5.08 MIL/uL (ref 4.22–5.81)
RDW: 13.2 % (ref 11.5–15.5)
WBC: 5.4 10*3/uL (ref 4.0–10.5)
nRBC: 0 % (ref 0.0–0.2)

## 2018-12-04 LAB — COMPREHENSIVE METABOLIC PANEL
ALT: 23 U/L (ref 0–44)
AST: 21 U/L (ref 15–41)
Albumin: 4.1 g/dL (ref 3.5–5.0)
Alkaline Phosphatase: 58 U/L (ref 38–126)
Anion gap: 7 (ref 5–15)
BUN: 16 mg/dL (ref 8–23)
CO2: 24 mmol/L (ref 22–32)
Calcium: 9.4 mg/dL (ref 8.9–10.3)
Chloride: 105 mmol/L (ref 98–111)
Creatinine, Ser: 1.68 mg/dL — ABNORMAL HIGH (ref 0.61–1.24)
GFR calc Af Amer: 45 mL/min — ABNORMAL LOW (ref >60)
GFR calc non Af Amer: 39 mL/min — ABNORMAL LOW (ref >60)
Glucose, Bld: 123 mg/dL — ABNORMAL HIGH (ref 70–99)
Potassium: 4.3 mmol/L (ref 3.5–5.1)
Sodium: 136 mmol/L (ref 135–145)
Total Bilirubin: 0.7 mg/dL (ref 0.3–1.2)
Total Protein: 7.3 g/dL (ref 6.5–8.1)

## 2018-12-04 MED ORDER — SODIUM CHLORIDE 0.9 % IV SOLN
Freq: Once | INTRAVENOUS | Status: AC
  Administered 2018-12-04: 11:00:00 via INTRAVENOUS
  Filled 2018-12-04: qty 250

## 2018-12-04 MED ORDER — DEXAMETHASONE SODIUM PHOSPHATE 10 MG/ML IJ SOLN
10.0000 mg | Freq: Once | INTRAMUSCULAR | Status: AC
  Administered 2018-12-04: 10 mg via INTRAVENOUS
  Filled 2018-12-04: qty 1

## 2018-12-04 MED ORDER — SODIUM CHLORIDE 0.9 % IV SOLN
45.0000 mg/m2 | Freq: Once | INTRAVENOUS | Status: AC
  Administered 2018-12-04: 108 mg via INTRAVENOUS
  Filled 2018-12-04: qty 18

## 2018-12-04 MED ORDER — DIPHENHYDRAMINE HCL 50 MG/ML IJ SOLN
25.0000 mg | Freq: Once | INTRAMUSCULAR | Status: AC
  Administered 2018-12-04: 11:00:00 25 mg via INTRAVENOUS
  Filled 2018-12-04: qty 1

## 2018-12-04 MED ORDER — SODIUM CHLORIDE 0.9 % IV SOLN
169.0000 mg | Freq: Once | INTRAVENOUS | Status: AC
  Administered 2018-12-04: 170 mg via INTRAVENOUS
  Filled 2018-12-04: qty 17

## 2018-12-04 MED ORDER — PALONOSETRON HCL INJECTION 0.25 MG/5ML
0.2500 mg | Freq: Once | INTRAVENOUS | Status: AC
  Administered 2018-12-04: 0.25 mg via INTRAVENOUS
  Filled 2018-12-04: qty 5

## 2018-12-04 MED ORDER — FAMOTIDINE IN NACL 20-0.9 MG/50ML-% IV SOLN
20.0000 mg | Freq: Once | INTRAVENOUS | Status: AC
  Administered 2018-12-04: 20 mg via INTRAVENOUS
  Filled 2018-12-04: qty 50

## 2018-12-04 NOTE — Progress Notes (Signed)
Cr 1.68 ok to proceed per md

## 2018-12-04 NOTE — Progress Notes (Signed)
[place]  Telephone:([phone] Fax:(336) [phone]   Name: Levi [name] Date: 12/04/2018 MRN: [id number]  DOB: [birth year]  Patient Care Team: Blane Ohara, MD as PCP - General (Family Medicine) Telford Nab, RN as Registered Nurse    REASON FOR CONSULTATION: Palliative Care consult requested for this 78 y.o. male with multiple medical problems including stage IIb squamous cell carcinoma of the left upper lobe of lung, asthma, COPD, melanoma in situ of the face s/p Mohs surgery fall 2019, renal insufficiency, hypothyroidism, tobacco abuse history (quit smoking 09/05/2018).  He underwent a low-dose chest CT for lung cancer screening on 09/21/2018 which revealed a large spiculated nodule in the left upper lobe with mean derived diameter of 21.3 mm and small adjacent satellite nodule measuring 5.3 mm.  Right upper lobe pulmonary nodule has equivalent diameter of 4.9 mm.  This was followed up with PET scan on 10/08/2018 which showed: PET scan on 10/08/2018 showed: Hypermetabolic left upper lobe spiculated nodule concerning for bronchogenic carcinoma, hypermetabolic pulmonary lung nodule in the left suprahilar lung consistent with metastatic disease, rounded nodule in the left upper lobe with mild radiotracer activity concerning for bronchogenic carcinoma. He underwent bronchoscopy on 10/19/2018.  Pathology consistent with non-small cell carcinoma, favor squamous cell carcinoma. He declined surgery and wished to pursue XRT with concurrent chemotherapy. Given his stage of disease he does not qualify for maintenance immunotherapy. He initiated weekly carbo-Taxol on 12/04/2018.  Palliative care was consulted to help address goals of care, discuss advanced care planning, and follow for ongoing supportive care.   SOCIAL HISTORY:     reports that he quit smoking about 2 months ago. His smoking use included cigarettes. He has a 15.50 pack-year smoking history. He  has never used smokeless tobacco. He reports that he does not drink alcohol or use drugs.  Social History   Socioeconomic History  . Marital status: Married    Spouse name: Levi [name]  . Number of children: 5  . Years of education: Not on file  . Highest education level: Not on file  Occupational History  . Not on file  Social Needs  . Financial resource strain: Not very hard  . Food insecurity    Worry: Never true    Inability: Never true  . Transportation needs    Medical: No    Non-medical: No  Tobacco Use  . Smoking status: Former Smoker    Packs/day: 0.25    Years: 62.00    Pack years: 15.50    Types: Cigarettes    Quit date: 09/05/2018    Years since quitting: 0.2  . Smokeless tobacco: Never Used  Substance and Sexual Activity  . Alcohol use: No    Alcohol/week: 0.0 standard drinks  . Drug use: No  . Sexual activity: Not on file  Lifestyle  . Physical activity    Days per week: Not on file    Minutes per session: Not on file  . Stress: Very much  Relationships  . Social connections    Talks on phone: More than three times a week    Gets together: Once a week    Attends religious service: Not on file    Active member of club or organization: Not on file    Attends meetings of clubs or organizations: Not on file    Relationship status: Not on file  . Intimate partner violence    Fear of current or ex partner: Not on file  Emotionally abused: Not on file    Physically abused: Not on file    Forced sexual activity: Not on file  Other Topics Concern  . 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 He is married to his wife of 30+ years, Levi [name] and has 5 children (1 deceased), many grand children, and one great grand child.    ADVANCE DIRECTIVES:  Does not have   CODE STATUS:   PAST MEDICAL HISTORY: Past Medical History:  Diagnosis Date  . Asthma   . COPD (chronic obstructive pulmonary disease) ([place])   . Hearing loss   . Hypothyroidism   . Mass of left lung   . 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 Prostate enlargement   . Shortness of breath   . Thyroid disease   . Tobacco abuse     PAST SURGICAL HISTORY:  Past Surgical History:  Procedure Laterality Date  . ELECTROMAGNETIC NAVIGATION BROCHOSCOPY Left 10/19/2018   Procedure: ELECTROMAGNETIC NAVIGATION BRONCHOSCOPY LEFT;  Surgeon: Tyler Pita, MD;  Location: [facility] ORS;  Service: Cardiopulmonary;  Laterality: Left;  . ENDOBRONCHIAL ULTRASOUND Left 10/19/2018   Procedure: ENDOBRONCHIAL ULTRASOUND LEFT;  Surgeon: Tyler Pita, MD;  Location: [facility] ORS;  Service: Cardiopulmonary;  Laterality: Left;  Marland Kitchen MELANOMA EXCISION Left   . NO PAST SURGERIES      HEMATOLOGY/ONCOLOGY HISTORY:  Oncology History  Squamous cell carcinoma lung, left ([facility])  10/27/2018 Initial Diagnosis   Squamous cell carcinoma lung, left ([place])   10/27/2018 Cancer Staging   Staging form: Lung, AJCC 8th Edition - Clinical stage from 10/27/2018: Stage IIB (cT1c, cN1, cM0) - Signed by Lloyd Huger, MD on 11/16/2018   11/27/2018 -  Chemotherapy   The patient had palonosetron (ALOXI) injection 0.25 mg, 0.25 mg, Intravenous,  Once, 2 of 8 cycles Administration: 0.25 mg (11/27/2018) CARBOplatin (PARAPLATIN) 190 mg in sodium chloride 0.9 % 100 mL chemo infusion, 190 mg (101 % of original dose 188 mg), Intravenous,  Once, 2 of 8 cycles Dose modification:   (original dose 188 mg, Cycle 1) Administration: 190 mg (11/27/2018) PACLitaxel (TAXOL) 108 mg in sodium chloride 0.9 % 250 mL chemo infusion (</= 55m/m2), 45 mg/m2 = 108 mg, Intravenous,  Once, 2 of 8 cycles Administration: 108 mg (11/27/2018)  for chemotherapy treatment.      ALLERGIES:  has No Known Allergies.  MEDICATIONS:  Current Outpatient Medications   Medication Sig Dispense Refill  . albuterol (PROVENTIL HFA;VENTOLIN HFA) 108 (90 Base) MCG/ACT inhaler Inhale 2 puffs into the lungs every 6 (six) hours as needed for wheezing or shortness of breath. 1 Inhaler 0  . ALPRAZolam (XANAX) 0.25 MG tablet Take 1 tablet (0.25 mg total) by mouth 2 (two) times daily as needed for anxiety. 30 tablet 1  . aspirin 81 MG tablet Take 81 mg by mouth daily.    . budesonide-formoterol (SYMBICORT) 160-4.5 MCG/ACT inhaler Inhale 2 puffs into the lungs 2 (two) times daily.    .Marland Kitchenlevothyroxine (SYNTHROID) 137 MCG tablet Take 137 mcg by mouth daily before breakfast.    . ondansetron (ZOFRAN) 8 MG tablet Take 1 tablet (8 mg total) by mouth 2 (two) times daily as needed for refractory nausea / vomiting. Start on day 3 after chemo. 30 tablet 2  . prochlorperazine (COMPAZINE)  10 MG tablet Take 1 tablet (10 mg total) by mouth every 6 (six) hours as needed (Nausea or vomiting). 60 tablet 2  . tamsulosin (FLOMAX) 0.4 MG CAPS capsule Take 1 capsule by mouth 1 day or 1 dose.     No current facility-administered medications for this visit.    Facility-Administered Medications Ordered in Other Visits  Medication Dose Route Frequency Provider Last Rate Last Dose  . 0.9 %  sodium chloride infusion   Intravenous Once Lloyd Huger, MD 999 mL/hr at 12/04/18 1032    . CARBOplatin (PARAPLATIN) 170 mg in sodium chloride 0.9 % 100 mL chemo infusion  170 mg Intravenous Once Lloyd Huger, MD      . PACLitaxel (TAXOL) 108 mg in sodium chloride 0.9 % 250 mL chemo infusion (</= 62m/m2)  45 mg/m2 (Treatment Plan Recorded) Intravenous Once FLloyd Huger MD        VITAL SIGNS: There were no vitals taken for this visit. There were no vitals filed for this visit.  Estimated body mass index is 34.64 kg/m as calculated from the following:   Height as of an earlier encounter on 12/04/18: 5' 11"  (1.803 m).   Weight as of an earlier encounter on 12/04/18: 248 lb 6.4 oz (112.7 kg).   LABS: CBC:    Component Value Date/Time   WBC 5.4 12/04/2018 0901   HGB 15.5 12/04/2018 0901   HCT 47.1 12/04/2018 0901   PLT 189 12/04/2018 0901   MCV 92.7 12/04/2018 0901   MCV 90.0 10/20/2015 1020   NEUTROABS 3.8 12/04/2018 0901   LYMPHSABS 1.2 12/04/2018 0901   MONOABS 0.3 12/04/2018 0901   EOSABS 0.1 12/04/2018 0901   BASOSABS 0.0 12/04/2018 0901   Comprehensive Metabolic Panel:    Component Value Date/Time   NA 136 12/04/2018 0901   K 4.3 12/04/2018 0901   CL 105 12/04/2018 0901   CO2 24 12/04/2018 0901   BUN 16 12/04/2018 0901   CREATININE 1.68 (H) 12/04/2018 0901   CREATININE 1.29 10/31/2014 1159   GLUCOSE 123 (H) 12/04/2018 0901   CALCIUM 9.4 12/04/2018 0901   AST 21 12/04/2018 0901   ALT 23 12/04/2018 0901   ALKPHOS 58 12/04/2018 0901   BILITOT 0.7 12/04/2018 0901   PROT 7.3 12/04/2018 0901   ALBUMIN 4.1 12/04/2018 0901    RADIOGRAPHIC STUDIES: No results found.  PERFORMANCE STATUS (ECOG) : 1 - Symptomatic but completely ambulatory  Review of Systems  Constitutional: Positive for activity change and appetite change. Negative for fatigue and unexpected weight change.  HENT: Negative for mouth sores and trouble swallowing.   Eyes: Negative for pain and visual disturbance.  Respiratory: Negative for cough and shortness of breath.   Cardiovascular: Negative for chest pain and leg swelling.  Gastrointestinal: Negative for abdominal distention, abdominal pain, constipation, diarrhea and nausea.  Endocrine: Negative for cold intolerance and polyuria.  Genitourinary: Positive for frequency (nocturnal frequency). Negative for decreased urine volume and difficulty urinating.  Musculoskeletal: Negative for arthralgias, gait problem and myalgias.  Skin: Negative for rash and wound.  Neurological: Negative for dizziness, weakness and light-headedness.  Psychiatric/Behavioral: Positive for sleep disturbance. The patient is nervous/anxious.     Physical Exam  Constitutional:      General: He is not in acute distress.    Comments: Elderly, frail, seen in infusion suite  HENT:     Head: Normocephalic and atraumatic.     Ears:     Comments: Hard of hearing    Nose:  No rhinorrhea.     Mouth/Throat:     Mouth: Mucous membranes are moist.     Pharynx: Oropharynx is clear.  Eyes:     General: No scleral icterus.    Conjunctiva/sclera: Conjunctivae normal.  Cardiovascular:     Rate and Rhythm: Normal rate and regular rhythm.  Pulmonary:     Effort: Pulmonary effort is normal.     Breath sounds: Normal breath sounds.  Abdominal:     Palpations: Abdomen is soft.     Tenderness: There is no abdominal tenderness.  Musculoskeletal:        General: No deformity or signs of injury.     Comments: No aids  Skin:    General: Skin is warm and dry.  Neurological:     Mental Status: He is alert and oriented to person, place, and time.     Motor: No weakness.  Psychiatric:        Mood and Affect: Mood normal.        Behavior: Behavior normal.    IMPRESSION: I met with patient in infusion suite today while receiving chemotherapy.  He met with Dr. Grayland Ormond today as well.  I introduced palliative care services and attempted to establish a therapeutic rapport.   He understands that the intent of therapy is curative though he has unresectable disease and confirms plan to receive concurrent chemotherapy and radiation.  Symptomatically, he is doing reasonably well. He says he is coping "okay" with new diagnosis of cancer. He says the anxiety and uncertainty has been most bothersome to him.  He was started on Xanax 0.25 twice daily as needed which she says has improved his symptoms though he has been reluctant to take it every day.  He says he had never had health problems and he is struggling to adjust with "feeling bad".  He says not knowing what is coming is scary.  Following his first cycle of chemotherapy he felt like he had a bad flu on day 3. He was  nauseous and was not confident in how to take antiemetics.  After speaking with Dr. Grayland Ormond though, he feels more confident and says he has had no more symptoms.  He has been alternating Zofran and Compazine with good results.  He has nocturnal urinary frequency and past history of prostate enlargement, Flomax was recently increased.  Symptoms have been more frequent since starting chemotherapy and his diagnosis.  We discussed possible anxiety contributing which she said was a possibility.  Encouraged nighttime dose of Xanax.  His appetite has been stable to somewhat reduced; weight overall stable.  He denies pain currently and in the setting of renal dysfunction, he would not be a candidate for NSAIDs though steroids in the future may be an option.  He lives at home with his wife of ~ 54 years, Levi [name], and is well supported by 5 children, many grandchildren, and 1 great grandchild.  He does not have ACP documents and is not familiar with them.  Consider addressing at a future visit.  I again reinforced how to contact clinic if he is feeling poorly or needs assistance.  He thanked me for meeting with him and said he has been so impressed by the quality care he has received at the cancer center.   PLAN: - Continue current scope of treatment per Dr. Grayland Ormond - continue xanax 0.25 mg BID prn; encouraged evening dose particularly prior to chemotherapy appointments when he may not be sleeping well due to anxiety - consider  adding SSRI vs SNRI vs others for ongoing anxiety - encouraged small frequent meals for maintain weight; discussed foods that may exacerbate vs reduce nausea - Consider ACP discussion for future visit - follow up with patient in 1 week for re-evaluation; consider meeting with patient in private room given his difficulty hearing.   Patient expressed understanding and was in agreement with this plan. He also understands that He can call the clinic at any time with any questions,  concerns, or complaints.   Time Total: 35 minutes  Visit consisted of counseling and education dealing with the complex and emotionally intense issues of symptom management and palliative care in the setting of serious and potentially life-threatening illness.Greater than 50%  of this time was spent counseling and coordinating care related to the above assessment and plan.  Signed by: Beckey Rutter, DNP, AGNP-C [place] [facility] [place] (work cell) [phone] (office)  CC: Dr. Grayland Ormond

## 2018-12-04 NOTE — Progress Notes (Signed)
Patient stated that he had been doing well, except for sinus congestion. Patient would like to know if he could take something for his congestion. Patient stated that he had always suffered sinus congestion but he wants to make sure what to take since he is currently on radiation and chemo therapy.

## 2018-12-05 ENCOUNTER — Other Ambulatory Visit: Payer: Self-pay

## 2018-12-05 ENCOUNTER — Ambulatory Visit
Admission: RE | Admit: 2018-12-05 | Discharge: 2018-12-05 | Disposition: A | Discharge status: Home / Self Care | Payer: No Typology Code available for payment source | Source: Ambulatory Visit | Attending: Radiation Oncology | Admitting: Radiation Oncology

## 2018-12-05 DIAGNOSIS — Z5111 Encounter for antineoplastic chemotherapy: Secondary | ICD-10-CM | POA: Diagnosis not present

## 2018-12-06 ENCOUNTER — Other Ambulatory Visit: Payer: Self-pay

## 2018-12-06 ENCOUNTER — Ambulatory Visit
Admission: RE | Admit: 2018-12-06 | Discharge: 2018-12-06 | Disposition: A | Discharge status: Home / Self Care | Payer: No Typology Code available for payment source | Source: Ambulatory Visit | Attending: Radiation Oncology | Admitting: Radiation Oncology

## 2018-12-06 DIAGNOSIS — Z5111 Encounter for antineoplastic chemotherapy: Secondary | ICD-10-CM | POA: Diagnosis not present

## 2018-12-07 ENCOUNTER — Other Ambulatory Visit: Payer: Self-pay

## 2018-12-07 ENCOUNTER — Ambulatory Visit
Admission: RE | Admit: 2018-12-07 | Discharge: 2018-12-07 | Disposition: A | Discharge status: Home / Self Care | Payer: No Typology Code available for payment source | Source: Ambulatory Visit | Attending: Radiation Oncology | Admitting: Radiation Oncology

## 2018-12-07 DIAGNOSIS — Z5111 Encounter for antineoplastic chemotherapy: Secondary | ICD-10-CM | POA: Diagnosis not present

## 2018-12-08 NOTE — Progress Notes (Signed)
[place]  [phone] Fax:(336) [phone]  ID: Levi [name] OB: November 15, 1940  MR#: [id number]  OFB#:[id number]  Patient Care Team: Blane Ohara, MD as PCP - General (Family Medicine) Telford Nab, RN as Registered Nurse  CHIEF COMPLAINT: Clinical stage IIB squamous cell carcinoma, left upper lobe lung.  INTERVAL HISTORY: Patient returns to clinic today for further evaluation and consideration of cycle 3 of carboplatinum and Taxol.  He is tolerating treatments relatively well.  He states he has increased weakness and fatigue for 1 to 2 days after his treatment, but then feels nearly back to his baseline. He continues to be highly anxious.  He has no neurologic complaints.  He denies any recent fevers or illnesses.  He has a good appetite and denies weight loss.  He has no chest pain, shortness of breath, cough, or hemoptysis.  He denies any nausea, vomiting, constipation, or diarrhea.  He has no urinary complaints.  Patient feels at his baseline offers no further specific complaints today.  REVIEW OF SYSTEMS:   Review of Systems  Constitutional: Positive for malaise/fatigue. Negative for fever and weight loss.  HENT: Negative.  Negative for congestion.   Respiratory: Negative.  Negative for cough, hemoptysis and shortness of breath.   Cardiovascular: Negative.  Negative for chest pain and leg swelling.  Gastrointestinal: Negative.  Negative for abdominal pain.  Genitourinary: Negative.  Negative for dysuria.  Musculoskeletal: Negative.  Negative for back pain.  Skin: Negative.  Negative for rash.  Neurological: Positive for weakness. Negative for dizziness, focal weakness and headaches.  Psychiatric/Behavioral: The patient is nervous/anxious.     As per HPI. Otherwise, a complete review of systems is negative.  PAST MEDICAL HISTORY: Past Medical History:  Diagnosis Date  . Asthma   . COPD (chronic obstructive pulmonary disease) ([place])   . Hearing  loss   . Hypothyroidism   . Mass of left lung   . 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 Prostate enlargement   . Shortness of breath   . Thyroid disease   . Tobacco abuse     PAST SURGICAL HISTORY: Past Surgical History:  Procedure Laterality Date  . ELECTROMAGNETIC NAVIGATION BROCHOSCOPY Left 10/19/2018   Procedure: ELECTROMAGNETIC NAVIGATION BRONCHOSCOPY LEFT;  Surgeon: Tyler Pita, MD;  Location: [facility] ORS;  Service: Cardiopulmonary;  Laterality: Left;  . ENDOBRONCHIAL ULTRASOUND Left 10/19/2018   Procedure: ENDOBRONCHIAL ULTRASOUND LEFT;  Surgeon: Tyler Pita, MD;  Location: [facility] ORS;  Service: Cardiopulmonary;  Laterality: Left;  Marland Kitchen MELANOMA EXCISION Left   . NO PAST SURGERIES      FAMILY HISTORY: Family History  Problem Relation Age of Onset  . Aneurysm Mother   . Cancer Father     ADVANCED DIRECTIVES (Y/N):  N  HEALTH MAINTENANCE: Social History   Tobacco Use  . Smoking status: Former Smoker    Packs/day: 0.25    Years: 62.00    Pack years: 15.50    Types: Cigarettes    Quit date: 09/05/2018    Years since quitting: 0.2  . Smokeless tobacco: Never Used  Substance Use Topics  . Alcohol use: No    Alcohol/week: 0.0 standard drinks  . Drug use: No     Colonoscopy:  PAP:  Bone density:  Lipid panel:  No Known Allergies  Current Outpatient Medications  Medication Sig Dispense Refill  . albuterol (PROVENTIL HFA;VENTOLIN HFA) 108 (90 Base) MCG/ACT inhaler Inhale 2 puffs into the lungs every 6 (six) hours as needed  for wheezing or shortness of breath. 1 Inhaler 0  . ALPRAZolam (XANAX) 0.25 MG tablet Take 1 tablet (0.25 mg total) by mouth 2 (two) times daily as needed for anxiety. 30 tablet 1  . aspirin 81 MG tablet Take 81 mg by mouth daily.    . budesonide-formoterol (SYMBICORT) 160-4.5 MCG/ACT inhaler Inhale 2 puffs into the lungs 2 (two) times daily.    Marland Kitchen levothyroxine (SYNTHROID) 137 MCG tablet Take 137 mcg by mouth daily before breakfast.    .  ondansetron (ZOFRAN) 8 MG tablet Take 1 tablet (8 mg total) by mouth 2 (two) times daily as needed for refractory nausea / vomiting. Start on day 3 after chemo. 30 tablet 2  . prochlorperazine (COMPAZINE) 10 MG tablet Take 1 tablet (10 mg total) by mouth every 6 (six) hours as needed (Nausea or vomiting). 60 tablet 2  . tamsulosin (FLOMAX) 0.4 MG CAPS capsule Take 1 capsule by mouth 1 day or 1 dose.     No current facility-administered medications for this visit.     OBJECTIVE: Vitals:   12/11/18 0920  BP: (!) 159/87  Pulse: 69  Resp: 20  Temp: 97.7 F (36.5 C)     Body mass index is 33.63 kg/m.    ECOG FS:0 - Asymptomatic  General: Well-developed, well-nourished, no acute distress. Eyes: Pink conjunctiva, anicteric sclera. HEENT: Normocephalic, moist mucous membranes. Lungs: Clear to auscultation bilaterally. Heart: Regular rate and rhythm. No rubs, murmurs, or gallops. Abdomen: Soft, nontender, nondistended. No organomegaly noted, normoactive bowel sounds. Musculoskeletal: No edema, cyanosis, or clubbing. Neuro: Alert, answering all questions appropriately. Cranial nerves grossly intact. Skin: No rashes or petechiae noted. Psych: Normal affect.  LAB RESULTS:  Lab Results  Component Value Date   NA 139 12/11/2018   K 4.1 12/11/2018   CL 107 12/11/2018   CO2 25 12/11/2018   GLUCOSE 114 (H) 12/11/2018   BUN 20 12/11/2018   CREATININE 1.62 (H) 12/11/2018   CALCIUM 8.8 (L) 12/11/2018   PROT 6.8 12/11/2018   ALBUMIN 3.8 12/11/2018   AST 17 12/11/2018   ALT 20 12/11/2018   ALKPHOS 52 12/11/2018   BILITOT 0.6 12/11/2018   GFRNONAA 41 (L) 12/11/2018   GFRAA 47 (L) 12/11/2018    Lab Results  Component Value Date   WBC 4.5 12/11/2018   NEUTROABS 2.8 12/11/2018   HGB 14.4 12/11/2018   HCT 44.7 12/11/2018   MCV 93.7 12/11/2018   PLT 161 12/11/2018     STUDIES: No results found.  ASSESSMENT: Clinical stage IIB squamous cell carcinoma, left upper lobe lung.   PLAN:    1.Clinical stage IIB squamous cell carcinoma, left upper lobe lung: PET scan results from October 08, 2018 reviewed independently.  Patient underwent biopsy with navigational bronchoscopy on October 19, 2018 confirming diagnosis.  Patient declined surgery and wished to pursue XRT along with concurrent chemotherapy.  Given his stage of disease he does not qualify for maintenance immunotherapy.  Plan to give weekly carboplatinum and Taxol throughout the duration of his XRT.  Continue daily XRT.  Proceed with cycle 3 of weekly carboplatinum and Taxol today.  Return to clinic in 1 week for further evaluation and consideration of cycle 4.   2.  History of melanoma: Unclear stage or depth, although by report was in situ.  Patient had Mohs surgery in fall 2019.  Lung biopsy consistent with squamous cell carcinoma. 3.  Anxiety: Continue Xanax as needed. 4.  Renal insufficiency: Patient creatinine remains elevated at 1.62.  Monitor. 5.  Sinus congestion: Patient does not complain of this today.  Patient expressed understanding and was in agreement with this plan. He also understands that He can call clinic at any time with any questions, concerns, or complaints.   Cancer Staging Squamous cell carcinoma lung, left ([place]) Staging form: Lung, AJCC 8th Edition - Clinical stage from 10/27/2018: Stage IIB (cT1c, cN1, cM0) - Signed by Lloyd Huger, MD on 11/16/2018   Lloyd Huger, MD   12/12/2018 6:17 AM

## 2018-12-10 ENCOUNTER — Other Ambulatory Visit: Payer: Self-pay

## 2018-12-10 ENCOUNTER — Ambulatory Visit
Admission: RE | Admit: 2018-12-10 | Discharge: 2018-12-10 | Disposition: A | Discharge status: Home / Self Care | Payer: No Typology Code available for payment source | Source: Ambulatory Visit | Attending: Radiation Oncology | Admitting: Radiation Oncology

## 2018-12-10 DIAGNOSIS — Z5111 Encounter for antineoplastic chemotherapy: Secondary | ICD-10-CM | POA: Diagnosis not present

## 2018-12-11 ENCOUNTER — Other Ambulatory Visit: Payer: Self-pay

## 2018-12-11 ENCOUNTER — Inpatient Hospital Stay: Payer: No Typology Code available for payment source

## 2018-12-11 ENCOUNTER — Inpatient Hospital Stay (HOSPITAL_BASED_OUTPATIENT_CLINIC_OR_DEPARTMENT_OTHER): Payer: No Typology Code available for payment source | Admitting: Oncology

## 2018-12-11 ENCOUNTER — Encounter: Payer: Self-pay | Admitting: Oncology

## 2018-12-11 ENCOUNTER — Ambulatory Visit
Admission: RE | Admit: 2018-12-11 | Discharge: 2018-12-11 | Disposition: A | Discharge status: Home / Self Care | Payer: No Typology Code available for payment source | Source: Ambulatory Visit | Attending: Radiation Oncology | Admitting: Radiation Oncology

## 2018-12-11 VITALS — BP 159/87 | HR 69 | Temp 97.7°F | Resp 20 | Wt 241.1 lb

## 2018-12-11 DIAGNOSIS — C3492 Malignant neoplasm of unspecified part of left bronchus or lung: Secondary | ICD-10-CM

## 2018-12-11 DIAGNOSIS — Z5111 Encounter for antineoplastic chemotherapy: Secondary | ICD-10-CM | POA: Diagnosis not present

## 2018-12-11 LAB — CBC WITH DIFFERENTIAL/PLATELET
Abs Immature Granulocytes: 0.03 10*3/uL (ref 0.00–0.07)
Basophils Absolute: 0 10*3/uL (ref 0.0–0.1)
Basophils Relative: 1 %
Eosinophils Absolute: 0.1 10*3/uL (ref 0.0–0.5)
Eosinophils Relative: 2 %
HCT: 44.7 % (ref 39.0–52.0)
Hemoglobin: 14.4 g/dL (ref 13.0–17.0)
Immature Granulocytes: 1 %
Lymphocytes Relative: 26 %
Lymphs Abs: 1.2 10*3/uL (ref 0.7–4.0)
MCH: 30.2 pg (ref 26.0–34.0)
MCHC: 32.2 g/dL (ref 30.0–36.0)
MCV: 93.7 fL (ref 80.0–100.0)
Monocytes Absolute: 0.4 10*3/uL (ref 0.1–1.0)
Monocytes Relative: 8 %
Neutro Abs: 2.8 10*3/uL (ref 1.7–7.7)
Neutrophils Relative %: 62 %
Platelets: 161 10*3/uL (ref 150–400)
RBC: 4.77 MIL/uL (ref 4.22–5.81)
RDW: 13.5 % (ref 11.5–15.5)
WBC: 4.5 10*3/uL (ref 4.0–10.5)
nRBC: 0 % (ref 0.0–0.2)

## 2018-12-11 LAB — COMPREHENSIVE METABOLIC PANEL
ALT: 20 U/L (ref 0–44)
AST: 17 U/L (ref 15–41)
Albumin: 3.8 g/dL (ref 3.5–5.0)
Alkaline Phosphatase: 52 U/L (ref 38–126)
Anion gap: 7 (ref 5–15)
BUN: 20 mg/dL (ref 8–23)
CO2: 25 mmol/L (ref 22–32)
Calcium: 8.8 mg/dL — ABNORMAL LOW (ref 8.9–10.3)
Chloride: 107 mmol/L (ref 98–111)
Creatinine, Ser: 1.62 mg/dL — ABNORMAL HIGH (ref 0.61–1.24)
GFR calc Af Amer: 47 mL/min — ABNORMAL LOW (ref >60)
GFR calc non Af Amer: 41 mL/min — ABNORMAL LOW (ref >60)
Glucose, Bld: 114 mg/dL — ABNORMAL HIGH (ref 70–99)
Potassium: 4.1 mmol/L (ref 3.5–5.1)
Sodium: 139 mmol/L (ref 135–145)
Total Bilirubin: 0.6 mg/dL (ref 0.3–1.2)
Total Protein: 6.8 g/dL (ref 6.5–8.1)

## 2018-12-11 MED ORDER — SODIUM CHLORIDE 0.9 % IV SOLN
173.4000 mg | Freq: Once | INTRAVENOUS | Status: AC
  Administered 2018-12-11: 170 mg via INTRAVENOUS
  Filled 2018-12-11: qty 17

## 2018-12-11 MED ORDER — FAMOTIDINE IN NACL 20-0.9 MG/50ML-% IV SOLN
20.0000 mg | Freq: Once | INTRAVENOUS | Status: AC
  Administered 2018-12-11: 20 mg via INTRAVENOUS
  Filled 2018-12-11: qty 50

## 2018-12-11 MED ORDER — DEXAMETHASONE SODIUM PHOSPHATE 10 MG/ML IJ SOLN
10.0000 mg | Freq: Once | INTRAMUSCULAR | Status: AC
  Administered 2018-12-11: 10 mg via INTRAVENOUS
  Filled 2018-12-11: qty 1

## 2018-12-11 MED ORDER — PALONOSETRON HCL INJECTION 0.25 MG/5ML
0.2500 mg | Freq: Once | INTRAVENOUS | Status: AC
  Administered 2018-12-11: 10:00:00 0.25 mg via INTRAVENOUS
  Filled 2018-12-11: qty 5

## 2018-12-11 MED ORDER — SODIUM CHLORIDE 0.9 % IV SOLN
Freq: Once | INTRAVENOUS | Status: AC
  Administered 2018-12-11: 10:00:00 via INTRAVENOUS
  Filled 2018-12-11: qty 250

## 2018-12-11 MED ORDER — SODIUM CHLORIDE 0.9 % IV SOLN
45.0000 mg/m2 | Freq: Once | INTRAVENOUS | Status: AC
  Administered 2018-12-11: 108 mg via INTRAVENOUS
  Filled 2018-12-11: qty 18

## 2018-12-11 MED ORDER — DIPHENHYDRAMINE HCL 50 MG/ML IJ SOLN
25.0000 mg | Freq: Once | INTRAMUSCULAR | Status: AC
  Administered 2018-12-11: 25 mg via INTRAVENOUS
  Filled 2018-12-11: qty 1

## 2018-12-11 NOTE — Progress Notes (Signed)
Dr. Grayland Ormond made aware of creatinine today, per Dr. Grayland Ormond proceed with treatment.

## 2018-12-12 ENCOUNTER — Other Ambulatory Visit: Payer: Self-pay

## 2018-12-12 ENCOUNTER — Ambulatory Visit
Admission: RE | Admit: 2018-12-12 | Discharge: 2018-12-12 | Disposition: A | Discharge status: Home / Self Care | Payer: No Typology Code available for payment source | Source: Ambulatory Visit | Attending: Radiation Oncology | Admitting: Radiation Oncology

## 2018-12-12 DIAGNOSIS — Z5111 Encounter for antineoplastic chemotherapy: Secondary | ICD-10-CM | POA: Diagnosis not present

## 2018-12-13 ENCOUNTER — Ambulatory Visit
Admission: RE | Admit: 2018-12-13 | Discharge: 2018-12-13 | Disposition: A | Discharge status: Home / Self Care | Payer: No Typology Code available for payment source | Source: Ambulatory Visit | Attending: Radiation Oncology | Admitting: Radiation Oncology

## 2018-12-13 ENCOUNTER — Other Ambulatory Visit: Payer: Self-pay

## 2018-12-13 DIAGNOSIS — Z5111 Encounter for antineoplastic chemotherapy: Secondary | ICD-10-CM | POA: Diagnosis not present

## 2018-12-13 NOTE — Progress Notes (Signed)
[name]  Telephone:(336) [phone] Fax:(336) [phone]  ID: Levi [name] OB: 1940-05-26  MR#: [id number]  IRS#:[id number]  Patient Care Team: Blane Ohara, MD as PCP - General (Family Medicine) Telford Nab, RN as Registered Nurse  CHIEF COMPLAINT: Clinical stage IIB squamous cell carcinoma, left upper lobe lung.  INTERVAL HISTORY: Patient returns to clinic today for further evaluation and consideration of cycle 4 of weekly carboplatinum and Taxol.  He continues to have increased weakness and fatigue, but otherwise is tolerating his treatments well.  He continues to be highly anxious.  He has no neurologic complaints.  He denies any recent fevers or illnesses.  He has a good appetite and denies weight loss.  He has no chest pain, shortness of breath, cough, or hemoptysis.  He denies any nausea, vomiting, constipation, or diarrhea.  He has no urinary complaints.  Patient offers no further specific complaints today.  REVIEW OF SYSTEMS:   Review of Systems  Constitutional: Positive for malaise/fatigue. Negative for fever and weight loss.  HENT: Negative.  Negative for congestion.   Respiratory: Negative.  Negative for cough, hemoptysis and shortness of breath.   Cardiovascular: Negative.  Negative for chest pain and leg swelling.  Gastrointestinal: Negative.  Negative for abdominal pain.  Genitourinary: Negative.  Negative for dysuria.  Musculoskeletal: Negative.  Negative for back pain.  Skin: Negative.  Negative for rash.  Neurological: Positive for weakness. Negative for dizziness, focal weakness and headaches.  Psychiatric/Behavioral: The patient is nervous/anxious.     As per HPI. Otherwise, a complete review of systems is negative.  PAST MEDICAL HISTORY: Past Medical History:  Diagnosis Date  . Asthma   . COPD (chronic obstructive pulmonary disease) ([place])   . Hearing loss   . Hypothyroidism   . Mass of left lung   . 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 Prostate enlargement   . Shortness of breath   . Thyroid disease   . Tobacco abuse     PAST SURGICAL HISTORY: Past Surgical History:  Procedure Laterality Date  . ELECTROMAGNETIC NAVIGATION BROCHOSCOPY Left 10/19/2018   Procedure: ELECTROMAGNETIC NAVIGATION BRONCHOSCOPY LEFT;  Surgeon: Tyler Pita, MD;  Location: [facility] ORS;  Service: Cardiopulmonary;  Laterality: Left;  . ENDOBRONCHIAL ULTRASOUND Left 10/19/2018   Procedure: ENDOBRONCHIAL ULTRASOUND LEFT;  Surgeon: Tyler Pita, MD;  Location: [facility] ORS;  Service: Cardiopulmonary;  Laterality: Left;  Marland Kitchen MELANOMA EXCISION Left   . NO PAST SURGERIES      FAMILY HISTORY: Family History  Problem Relation Age of Onset  . Aneurysm Mother   . Cancer Father     ADVANCED DIRECTIVES (Y/N):  N  HEALTH MAINTENANCE: Social History   Tobacco Use  . Smoking status: Former Smoker    Packs/day: 0.25    Years: 62.00    Pack years: 15.50    Types: Cigarettes    Quit date: 09/05/2018    Years since quitting: 0.2  . Smokeless tobacco: Never Used  Substance Use Topics  . Alcohol use: No    Alcohol/week: 0.0 standard drinks  . Drug use: No     Colonoscopy:  PAP:  Bone density:  Lipid panel:  No Known Allergies  Current Outpatient Medications  Medication Sig Dispense Refill  . albuterol (PROVENTIL HFA;VENTOLIN HFA) 108 (90 Base) MCG/ACT inhaler Inhale 2 puffs into the lungs every 6 (six) hours as needed for wheezing or shortness of breath. 1 Inhaler 0  . ALPRAZolam (XANAX) 0.25 MG tablet Take 1  tablet (0.25 mg total) by mouth 2 (two) times daily as needed for anxiety. 30 tablet 1  . aspirin 81 MG tablet Take 81 mg by mouth daily.    . budesonide-formoterol (SYMBICORT) 160-4.5 MCG/ACT inhaler Inhale 2 puffs into the lungs 2 (two) times daily.    Marland Kitchen levothyroxine (SYNTHROID) 137 MCG tablet Take 137 mcg by mouth daily before breakfast.    . ondansetron (ZOFRAN) 8 MG tablet Take 1 tablet (8 mg total) by mouth 2 (two) times daily  as needed for refractory nausea / vomiting. Start on day 3 after chemo. 30 tablet 2  . prochlorperazine (COMPAZINE) 10 MG tablet Take 1 tablet (10 mg total) by mouth every 6 (six) hours as needed (Nausea or vomiting). 60 tablet 2  . tamsulosin (FLOMAX) 0.4 MG CAPS capsule Take 1 capsule by mouth 1 day or 1 dose.    Marland Kitchen azithromycin (ZITHROMAX Z-PAK) 250 MG tablet Follow package instructions (Patient not taking: Reported on 12/19/2018) 6 each 0   No current facility-administered medications for this visit.     OBJECTIVE: Vitals:   12/19/18 0842  BP: (!) 172/117  Pulse: 73  Temp: (!) 97.5 F (36.4 C)     Body mass index is 35.15 kg/m.    ECOG FS:0 - Asymptomatic  General: Well-developed, well-nourished, no acute distress. Eyes: Pink conjunctiva, anicteric sclera. HEENT: Normocephalic, moist mucous membranes. Lungs: Clear to auscultation bilaterally. Heart: Regular rate and rhythm. No rubs, murmurs, or gallops. Abdomen: Soft, nontender, nondistended. No organomegaly noted, normoactive bowel sounds. Musculoskeletal: No edema, cyanosis, or clubbing. Neuro: Alert, answering all questions appropriately. Cranial nerves grossly intact. Skin: No rashes or petechiae noted. Psych: Normal affect.  LAB RESULTS:  Lab Results  Component Value Date   NA 138 12/19/2018   K 4.1 12/19/2018   CL 107 12/19/2018   CO2 24 12/19/2018   GLUCOSE 117 (H) 12/19/2018   BUN 16 12/19/2018   CREATININE 1.61 (H) 12/19/2018   CALCIUM 9.0 12/19/2018   PROT 6.9 12/19/2018   ALBUMIN 3.8 12/19/2018   AST 28 12/19/2018   ALT 27 12/19/2018   ALKPHOS 44 12/19/2018   BILITOT 0.7 12/19/2018   GFRNONAA 41 (L) 12/19/2018   GFRAA 47 (L) 12/19/2018    Lab Results  Component Value Date   WBC 3.3 (L) 12/19/2018   NEUTROABS 2.0 12/19/2018   HGB 14.1 12/19/2018   HCT 43.2 12/19/2018   MCV 93.5 12/19/2018   PLT 152 12/19/2018     STUDIES: No results found.  ASSESSMENT: Clinical stage IIB squamous cell  carcinoma, left upper lobe lung.  PLAN:    1.Clinical stage IIB squamous cell carcinoma, left upper lobe lung: PET scan results from October 08, 2018 reviewed independently.  Patient underwent biopsy with navigational bronchoscopy on October 19, 2018 confirming diagnosis.  Patient declined surgery and wished to pursue XRT along with concurrent chemotherapy.  Given his stage of disease he does not qualify for maintenance immunotherapy.  Plan to give weekly carboplatinum and Taxol throughout the duration of his XRT.  Continue daily XRT completing on January 14, 2019.  Proceed with cycle 4 of weekly carboplatinum and Taxol today.  Return to clinic in 1 week for further evaluation and consideration of cycle 5.    2.  History of melanoma: Unclear stage or depth, although by report was in situ.  Patient had Mohs surgery in fall 2019.  Lung biopsy consistent with squamous cell carcinoma. 3.  Anxiety: Continue Xanax as needed. 4.  Renal insufficiency: Creatinine  elevated, but unchanged at 1.61. 5.  Hypertension: Patient's blood pressure is significantly elevated today, likely secondary to anxiety.  Monitor.   Patient expressed understanding and was in agreement with this plan. He also understands that He can call clinic at any time with any questions, concerns, or complaints.   Cancer Staging Squamous cell carcinoma lung, left ([place]) Staging form: Lung, AJCC 8th Edition - Clinical stage from 10/27/2018: Stage IIB (cT1c, cN1, cM0) - Signed by Lloyd Huger, MD on 11/16/2018   Lloyd Huger, MD   12/19/2018 1:33 PM

## 2018-12-14 ENCOUNTER — Other Ambulatory Visit: Payer: Self-pay

## 2018-12-14 ENCOUNTER — Ambulatory Visit
Admission: RE | Admit: 2018-12-14 | Discharge: 2018-12-14 | Disposition: A | Discharge status: Home / Self Care | Payer: No Typology Code available for payment source | Source: Ambulatory Visit | Attending: Radiation Oncology | Admitting: Radiation Oncology

## 2018-12-14 DIAGNOSIS — Z5111 Encounter for antineoplastic chemotherapy: Secondary | ICD-10-CM | POA: Diagnosis not present

## 2018-12-17 ENCOUNTER — Other Ambulatory Visit: Payer: Self-pay | Admitting: *Deleted

## 2018-12-17 ENCOUNTER — Ambulatory Visit
Admission: RE | Admit: 2018-12-17 | Discharge: 2018-12-17 | Disposition: A | Discharge status: Home / Self Care | Payer: No Typology Code available for payment source | Source: Ambulatory Visit | Attending: Radiation Oncology | Admitting: Radiation Oncology

## 2018-12-17 ENCOUNTER — Other Ambulatory Visit: Payer: Self-pay

## 2018-12-17 DIAGNOSIS — Z5111 Encounter for antineoplastic chemotherapy: Secondary | ICD-10-CM | POA: Diagnosis not present

## 2018-12-17 MED ORDER — AZITHROMYCIN 250 MG PO TABS: ORAL_TABLET | ORAL | 0 refills | Status: DC

## 2018-12-18 ENCOUNTER — Ambulatory Visit
Admission: RE | Admit: 2018-12-18 | Discharge: 2018-12-18 | Disposition: A | Discharge status: Home / Self Care | Payer: No Typology Code available for payment source | Source: Ambulatory Visit | Attending: Radiation Oncology | Admitting: Radiation Oncology

## 2018-12-18 ENCOUNTER — Other Ambulatory Visit: Payer: Self-pay

## 2018-12-18 DIAGNOSIS — Z5111 Encounter for antineoplastic chemotherapy: Secondary | ICD-10-CM | POA: Diagnosis not present

## 2018-12-19 ENCOUNTER — Other Ambulatory Visit: Payer: Self-pay

## 2018-12-19 ENCOUNTER — Inpatient Hospital Stay: Payer: No Typology Code available for payment source

## 2018-12-19 ENCOUNTER — Ambulatory Visit
Admission: RE | Admit: 2018-12-19 | Discharge: 2018-12-19 | Disposition: A | Discharge status: Home / Self Care | Payer: No Typology Code available for payment source | Source: Ambulatory Visit | Attending: Radiation Oncology | Admitting: Radiation Oncology

## 2018-12-19 ENCOUNTER — Inpatient Hospital Stay (HOSPITAL_BASED_OUTPATIENT_CLINIC_OR_DEPARTMENT_OTHER): Payer: No Typology Code available for payment source | Admitting: Oncology

## 2018-12-19 ENCOUNTER — Encounter: Payer: Self-pay | Admitting: Oncology

## 2018-12-19 VITALS — BP 163/90 | HR 73 | Resp 20

## 2018-12-19 VITALS — BP 172/117 | HR 73 | Temp 97.5°F | Ht 71.0 in | Wt 252.0 lb

## 2018-12-19 DIAGNOSIS — C3492 Malignant neoplasm of unspecified part of left bronchus or lung: Secondary | ICD-10-CM | POA: Diagnosis not present

## 2018-12-19 DIAGNOSIS — Z5111 Encounter for antineoplastic chemotherapy: Secondary | ICD-10-CM | POA: Diagnosis not present

## 2018-12-19 LAB — CBC WITH DIFFERENTIAL/PLATELET
Abs Immature Granulocytes: 0.04 10*3/uL (ref 0.00–0.07)
Basophils Absolute: 0 10*3/uL (ref 0.0–0.1)
Basophils Relative: 1 %
Eosinophils Absolute: 0.1 10*3/uL (ref 0.0–0.5)
Eosinophils Relative: 2 %
HCT: 43.2 % (ref 39.0–52.0)
Hemoglobin: 14.1 g/dL (ref 13.0–17.0)
Immature Granulocytes: 1 %
Lymphocytes Relative: 24 %
Lymphs Abs: 0.8 10*3/uL (ref 0.7–4.0)
MCH: 30.5 pg (ref 26.0–34.0)
MCHC: 32.6 g/dL (ref 30.0–36.0)
MCV: 93.5 fL (ref 80.0–100.0)
Monocytes Absolute: 0.4 10*3/uL (ref 0.1–1.0)
Monocytes Relative: 11 %
Neutro Abs: 2 10*3/uL (ref 1.7–7.7)
Neutrophils Relative %: 61 %
Platelets: 152 10*3/uL (ref 150–400)
RBC: 4.62 MIL/uL (ref 4.22–5.81)
RDW: 13.9 % (ref 11.5–15.5)
WBC: 3.3 10*3/uL — ABNORMAL LOW (ref 4.0–10.5)
nRBC: 0 % (ref 0.0–0.2)

## 2018-12-19 LAB — COMPREHENSIVE METABOLIC PANEL
ALT: 27 U/L (ref 0–44)
AST: 28 U/L (ref 15–41)
Albumin: 3.8 g/dL (ref 3.5–5.0)
Alkaline Phosphatase: 44 U/L (ref 38–126)
Anion gap: 7 (ref 5–15)
BUN: 16 mg/dL (ref 8–23)
CO2: 24 mmol/L (ref 22–32)
Calcium: 9 mg/dL (ref 8.9–10.3)
Chloride: 107 mmol/L (ref 98–111)
Creatinine, Ser: 1.61 mg/dL — ABNORMAL HIGH (ref 0.61–1.24)
GFR calc Af Amer: 47 mL/min — ABNORMAL LOW (ref >60)
GFR calc non Af Amer: 41 mL/min — ABNORMAL LOW (ref >60)
Glucose, Bld: 117 mg/dL — ABNORMAL HIGH (ref 70–99)
Potassium: 4.1 mmol/L (ref 3.5–5.1)
Sodium: 138 mmol/L (ref 135–145)
Total Bilirubin: 0.7 mg/dL (ref 0.3–1.2)
Total Protein: 6.9 g/dL (ref 6.5–8.1)

## 2018-12-19 MED ORDER — SODIUM CHLORIDE 0.9 % IV SOLN
Freq: Once | INTRAVENOUS | Status: AC
  Administered 2018-12-19: 10:00:00 via INTRAVENOUS
  Filled 2018-12-19: qty 250

## 2018-12-19 MED ORDER — SODIUM CHLORIDE 0.9 % IV SOLN
174.2000 mg | Freq: Once | INTRAVENOUS | Status: AC
  Administered 2018-12-19: 170 mg via INTRAVENOUS
  Filled 2018-12-19: qty 17

## 2018-12-19 MED ORDER — DIPHENHYDRAMINE HCL 50 MG/ML IJ SOLN
25.0000 mg | Freq: Once | INTRAMUSCULAR | Status: AC
  Administered 2018-12-19: 25 mg via INTRAVENOUS
  Filled 2018-12-19: qty 1

## 2018-12-19 MED ORDER — PALONOSETRON HCL INJECTION 0.25 MG/5ML
0.2500 mg | Freq: Once | INTRAVENOUS | Status: AC
  Administered 2018-12-19: 0.25 mg via INTRAVENOUS
  Filled 2018-12-19: qty 5

## 2018-12-19 MED ORDER — SODIUM CHLORIDE 0.9 % IV SOLN
45.0000 mg/m2 | Freq: Once | INTRAVENOUS | Status: AC
  Administered 2018-12-19: 108 mg via INTRAVENOUS
  Filled 2018-12-19: qty 18

## 2018-12-19 MED ORDER — DEXAMETHASONE SODIUM PHOSPHATE 10 MG/ML IJ SOLN
10.0000 mg | Freq: Once | INTRAMUSCULAR | Status: AC
  Administered 2018-12-19: 10:00:00 10 mg via INTRAVENOUS
  Filled 2018-12-19: qty 1

## 2018-12-19 MED ORDER — FAMOTIDINE IN NACL 20-0.9 MG/50ML-% IV SOLN
20.0000 mg | Freq: Once | INTRAVENOUS | Status: AC
  Administered 2018-12-19: 10:00:00 20 mg via INTRAVENOUS
  Filled 2018-12-19: qty 50

## 2018-12-19 NOTE — Progress Notes (Signed)
Patient stated that he had been doing better thanks to the antibiotic.

## 2018-12-20 ENCOUNTER — Other Ambulatory Visit: Payer: Self-pay

## 2018-12-20 ENCOUNTER — Ambulatory Visit
Admission: RE | Admit: 2018-12-20 | Discharge: 2018-12-20 | Disposition: A | Discharge status: Home / Self Care | Payer: No Typology Code available for payment source | Source: Ambulatory Visit | Attending: Radiation Oncology | Admitting: Radiation Oncology

## 2018-12-20 ENCOUNTER — Other Ambulatory Visit: Payer: Self-pay | Admitting: Oncology

## 2018-12-20 DIAGNOSIS — Z5111 Encounter for antineoplastic chemotherapy: Secondary | ICD-10-CM | POA: Diagnosis not present

## 2018-12-21 ENCOUNTER — Ambulatory Visit
Admission: RE | Admit: 2018-12-21 | Discharge: 2018-12-21 | Disposition: A | Discharge status: Home / Self Care | Payer: No Typology Code available for payment source | Source: Ambulatory Visit | Attending: Radiation Oncology | Admitting: Radiation Oncology

## 2018-12-21 ENCOUNTER — Other Ambulatory Visit: Payer: Self-pay

## 2018-12-21 DIAGNOSIS — Z5111 Encounter for antineoplastic chemotherapy: Secondary | ICD-10-CM | POA: Diagnosis not present

## 2018-12-21 NOTE — Progress Notes (Signed)
[name]  Telephone:(336) [phone] Fax:(336) [phone]  ID: [name] OB: 01/10/1941  MR#: [id number]  PJK#:[id number]  Patient Care Team: Blane Ohara, MD as PCP - General (Family Medicine) Telford Nab, RN as Registered Nurse  CHIEF COMPLAINT: Clinical stage IIB squamous cell carcinoma, left upper lobe lung.  INTERVAL HISTORY: Patient returns to clinic today for further evaluation and consideration of cycle 5 of weekly carboplatinum and Taxol.  He continues to have progressive weakness and fatigue, but otherwise is tolerating his treatments well.  He continues to be highly anxious.  He has no neurologic complaints.  He denies any recent fevers or illnesses.  He has a good appetite and denies weight loss.  He has no chest pain, shortness of breath, cough, or hemoptysis.  He denies any nausea, vomiting, constipation, or diarrhea.  He has no urinary complaints.  Patient offers no further specific complaints today.  REVIEW OF SYSTEMS:   Review of Systems  Constitutional: Positive for malaise/fatigue. Negative for fever and weight loss.  HENT: Negative.  Negative for congestion.   Respiratory: Negative.  Negative for cough, hemoptysis and shortness of breath.   Cardiovascular: Negative.  Negative for chest pain and leg swelling.  Gastrointestinal: Negative.  Negative for abdominal pain.  Genitourinary: Negative.  Negative for dysuria.  Musculoskeletal: Negative.  Negative for back pain.  Skin: Negative.  Negative for rash.  Neurological: Positive for weakness. Negative for dizziness, focal weakness and headaches.  Psychiatric/Behavioral: The patient is nervous/anxious.     As per HPI. Otherwise, a complete review of systems is negative.  PAST MEDICAL HISTORY: Past Medical History:  Diagnosis Date  . Asthma   . COPD (chronic obstructive pulmonary disease) ([place])   . Hearing loss   . Hypothyroidism   . Mass of left lung   . 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 Prostate enlargement   . Shortness of breath   . Thyroid disease   . Tobacco abuse     PAST SURGICAL HISTORY: Past Surgical History:  Procedure Laterality Date  . ELECTROMAGNETIC NAVIGATION BROCHOSCOPY Left 10/19/2018   Procedure: ELECTROMAGNETIC NAVIGATION BRONCHOSCOPY LEFT;  Surgeon: Tyler Pita, MD;  Location: [facility] ORS;  Service: Cardiopulmonary;  Laterality: Left;  . ENDOBRONCHIAL ULTRASOUND Left 10/19/2018   Procedure: ENDOBRONCHIAL ULTRASOUND LEFT;  Surgeon: Tyler Pita, MD;  Location: [facility] ORS;  Service: Cardiopulmonary;  Laterality: Left;  Marland Kitchen MELANOMA EXCISION Left   . NO PAST SURGERIES      FAMILY HISTORY: Family History  Problem Relation Age of Onset  . Aneurysm Mother   . Cancer Father     ADVANCED DIRECTIVES (Y/N):  N  HEALTH MAINTENANCE: Social History   Tobacco Use  . Smoking status: Former Smoker    Packs/day: 0.25    Years: 62.00    Pack years: 15.50    Types: Cigarettes    Quit date: 09/05/2018    Years since quitting: 0.3  . Smokeless tobacco: Never Used  Substance Use Topics  . Alcohol use: No    Alcohol/week: 0.0 standard drinks  . Drug use: No     Colonoscopy:  PAP:  Bone density:  Lipid panel:  No Known Allergies  Current Outpatient Medications  Medication Sig Dispense Refill  . albuterol (PROVENTIL HFA;VENTOLIN HFA) 108 (90 Base) MCG/ACT inhaler Inhale 2 puffs into the lungs every 6 (six) hours as needed for wheezing or shortness of breath. 1 Inhaler 0  . ALPRAZolam (XANAX) 0.25 MG tablet TAKE 1  TABLET (0.25 MG TOTAL) BY MOUTH 2 (TWO) TIMES DAILY AS NEEDED FOR ANXIETY. 60 tablet 1  . aspirin 81 MG tablet Take 81 mg by mouth daily.    Marland Kitchen azithromycin (ZITHROMAX Z-PAK) 250 MG tablet Follow package instructions (Patient not taking: Reported on 12/19/2018) 6 each 0  . budesonide-formoterol (SYMBICORT) 160-4.5 MCG/ACT inhaler Inhale 2 puffs into the lungs 2 (two) times daily.    Marland Kitchen levothyroxine (SYNTHROID) 137 MCG tablet Take 137  mcg by mouth daily before breakfast.    . ondansetron (ZOFRAN) 8 MG tablet Take 1 tablet (8 mg total) by mouth 2 (two) times daily as needed for refractory nausea / vomiting. Start on day 3 after chemo. 30 tablet 2  . prochlorperazine (COMPAZINE) 10 MG tablet Take 1 tablet (10 mg total) by mouth every 6 (six) hours as needed (Nausea or vomiting). 60 tablet 2  . tamsulosin (FLOMAX) 0.4 MG CAPS capsule Take 1 capsule by mouth 1 day or 1 dose.     No current facility-administered medications for this visit.     OBJECTIVE: Vitals:   12/25/18 1005  BP: (!) 160/98  Pulse: 72  Resp: 18  Temp: (!) 96.6 F (35.9 C)     Body mass index is 35.05 kg/m.    ECOG FS:0 - Asymptomatic  General: Well-developed, well-nourished, no acute distress. Eyes: Pink conjunctiva, anicteric sclera. HEENT: Normocephalic, moist mucous membranes. Lungs: Clear to auscultation bilaterally. Heart: Regular rate and rhythm. No rubs, murmurs, or gallops. Abdomen: Soft, nontender, nondistended. No organomegaly noted, normoactive bowel sounds. Musculoskeletal: No edema, cyanosis, or clubbing. Neuro: Alert, answering all questions appropriately. Cranial nerves grossly intact. Skin: No rashes or petechiae noted. Psych: Normal affect.  LAB RESULTS:  Lab Results  Component Value Date   NA 138 12/25/2018   K 4.4 12/25/2018   CL 109 12/25/2018   CO2 23 12/25/2018   GLUCOSE 107 (H) 12/25/2018   BUN 19 12/25/2018   CREATININE 1.51 (H) 12/25/2018   CALCIUM 8.9 12/25/2018   PROT 6.5 12/25/2018   ALBUMIN 3.9 12/25/2018   AST 22 12/25/2018   ALT 26 12/25/2018   ALKPHOS 48 12/25/2018   BILITOT 1.1 12/25/2018   GFRNONAA 44 (L) 12/25/2018   GFRAA 51 (L) 12/25/2018    Lab Results  Component Value Date   WBC 3.2 (L) 12/25/2018   NEUTROABS 2.3 12/25/2018   HGB 13.8 12/25/2018   HCT 42.2 12/25/2018   MCV 94.2 12/25/2018   PLT 103 (L) 12/25/2018     STUDIES: No results found.  ASSESSMENT: Clinical stage IIB  squamous cell carcinoma, left upper lobe lung.  PLAN:    1.Clinical stage IIB squamous cell carcinoma, left upper lobe lung: PET scan results from October 08, 2018 reviewed independently.  Patient underwent biopsy with navigational bronchoscopy on October 19, 2018 confirming diagnosis.  Patient declined surgery and wished to pursue XRT along with concurrent chemotherapy.  Given his stage of disease he does not qualify for maintenance immunotherapy.  Plan to give weekly carboplatinum and Taxol throughout the duration of his XRT.  Continue daily XRT completing on January 14, 2019.  Proceed with cycle 5 of weekly carboplatinum and Taxol today.  Return to clinic in 1 week for further evaluation and consideration of cycle 6.     2.  History of melanoma: Unclear stage or depth, although by report was in situ.  Patient had Mohs surgery in fall 2019.  Lung biopsy consistent with squamous cell carcinoma. 3.  Anxiety: Continue Xanax as needed.  4.  Renal insufficiency: Creatinine is improved to 1.51 today.  Monitor. 5.  Hypertension: Blood pressure remains moderately elevated.  Likely secondary to anxiety.  Monitor. 6.  Thrombocytopenia: Mild, proceed with treatment as above. 7.  Leukopenia: Mild, proceed with treatment as above.  Patient expressed understanding and was in agreement with this plan. He also understands that He can call clinic at any time with any questions, concerns, or complaints.   Cancer Staging Squamous cell carcinoma lung, left ([place]) Staging form: Lung, AJCC 8th Edition - Clinical stage from 10/27/2018: Stage IIB (cT1c, cN1, cM0) - Signed by Lloyd Huger, MD on 11/16/2018   Lloyd Huger, MD   12/25/2018 4:35 PM

## 2018-12-24 ENCOUNTER — Ambulatory Visit
Admission: RE | Admit: 2018-12-24 | Discharge: 2018-12-24 | Disposition: A | Discharge status: Home / Self Care | Payer: No Typology Code available for payment source | Source: Ambulatory Visit | Attending: Radiation Oncology | Admitting: Radiation Oncology

## 2018-12-24 ENCOUNTER — Other Ambulatory Visit: Payer: Self-pay

## 2018-12-24 DIAGNOSIS — Z5111 Encounter for antineoplastic chemotherapy: Secondary | ICD-10-CM | POA: Diagnosis not present

## 2018-12-25 ENCOUNTER — Inpatient Hospital Stay: Payer: No Typology Code available for payment source

## 2018-12-25 ENCOUNTER — Inpatient Hospital Stay (HOSPITAL_BASED_OUTPATIENT_CLINIC_OR_DEPARTMENT_OTHER): Payer: No Typology Code available for payment source | Admitting: Oncology

## 2018-12-25 ENCOUNTER — Other Ambulatory Visit: Payer: Self-pay

## 2018-12-25 ENCOUNTER — Ambulatory Visit
Admission: RE | Admit: 2018-12-25 | Discharge: 2018-12-25 | Disposition: A | Discharge status: Home / Self Care | Payer: No Typology Code available for payment source | Source: Ambulatory Visit | Attending: Radiation Oncology | Admitting: Radiation Oncology

## 2018-12-25 ENCOUNTER — Encounter: Payer: Self-pay | Admitting: Oncology

## 2018-12-25 VITALS — BP 160/98 | HR 72 | Temp 96.6°F | Resp 18 | Wt 251.3 lb

## 2018-12-25 DIAGNOSIS — C3492 Malignant neoplasm of unspecified part of left bronchus or lung: Secondary | ICD-10-CM

## 2018-12-25 DIAGNOSIS — Z5111 Encounter for antineoplastic chemotherapy: Secondary | ICD-10-CM | POA: Diagnosis not present

## 2018-12-25 LAB — COMPREHENSIVE METABOLIC PANEL
ALT: 26 U/L (ref 0–44)
AST: 22 U/L (ref 15–41)
Albumin: 3.9 g/dL (ref 3.5–5.0)
Alkaline Phosphatase: 48 U/L (ref 38–126)
Anion gap: 6 (ref 5–15)
BUN: 19 mg/dL (ref 8–23)
CO2: 23 mmol/L (ref 22–32)
Calcium: 8.9 mg/dL (ref 8.9–10.3)
Chloride: 109 mmol/L (ref 98–111)
Creatinine, Ser: 1.51 mg/dL — ABNORMAL HIGH (ref 0.61–1.24)
GFR calc Af Amer: 51 mL/min — ABNORMAL LOW (ref >60)
GFR calc non Af Amer: 44 mL/min — ABNORMAL LOW (ref >60)
Glucose, Bld: 107 mg/dL — ABNORMAL HIGH (ref 70–99)
Potassium: 4.4 mmol/L (ref 3.5–5.1)
Sodium: 138 mmol/L (ref 135–145)
Total Bilirubin: 1.1 mg/dL (ref 0.3–1.2)
Total Protein: 6.5 g/dL (ref 6.5–8.1)

## 2018-12-25 LAB — CBC WITH DIFFERENTIAL/PLATELET
Abs Immature Granulocytes: 0.02 10*3/uL (ref 0.00–0.07)
Basophils Absolute: 0 10*3/uL (ref 0.0–0.1)
Basophils Relative: 1 %
Eosinophils Absolute: 0 10*3/uL (ref 0.0–0.5)
Eosinophils Relative: 1 %
HCT: 42.2 % (ref 39.0–52.0)
Hemoglobin: 13.8 g/dL (ref 13.0–17.0)
Immature Granulocytes: 1 %
Lymphocytes Relative: 19 %
Lymphs Abs: 0.6 10*3/uL — ABNORMAL LOW (ref 0.7–4.0)
MCH: 30.8 pg (ref 26.0–34.0)
MCHC: 32.7 g/dL (ref 30.0–36.0)
MCV: 94.2 fL (ref 80.0–100.0)
Monocytes Absolute: 0.3 10*3/uL (ref 0.1–1.0)
Monocytes Relative: 8 %
Neutro Abs: 2.3 10*3/uL (ref 1.7–7.7)
Neutrophils Relative %: 70 %
Platelets: 103 10*3/uL — ABNORMAL LOW (ref 150–400)
RBC: 4.48 MIL/uL (ref 4.22–5.81)
RDW: 14.2 % (ref 11.5–15.5)
WBC: 3.2 10*3/uL — ABNORMAL LOW (ref 4.0–10.5)
nRBC: 0 % (ref 0.0–0.2)

## 2018-12-25 MED ORDER — SODIUM CHLORIDE 0.9 % IV SOLN
45.0000 mg/m2 | Freq: Once | INTRAVENOUS | Status: AC
  Administered 2018-12-25: 12:00:00 108 mg via INTRAVENOUS
  Filled 2018-12-25: qty 18

## 2018-12-25 MED ORDER — PALONOSETRON HCL INJECTION 0.25 MG/5ML
0.2500 mg | Freq: Once | INTRAVENOUS | Status: AC
  Administered 2018-12-25: 0.25 mg via INTRAVENOUS
  Filled 2018-12-25: qty 5

## 2018-12-25 MED ORDER — DIPHENHYDRAMINE HCL 50 MG/ML IJ SOLN
25.0000 mg | Freq: Once | INTRAMUSCULAR | Status: AC
  Administered 2018-12-25: 12:00:00 25 mg via INTRAVENOUS
  Filled 2018-12-25: qty 1

## 2018-12-25 MED ORDER — DEXAMETHASONE SODIUM PHOSPHATE 10 MG/ML IJ SOLN
10.0000 mg | Freq: Once | INTRAMUSCULAR | Status: AC
  Administered 2018-12-25: 11:00:00 10 mg via INTRAVENOUS
  Filled 2018-12-25: qty 1

## 2018-12-25 MED ORDER — FAMOTIDINE IN NACL 20-0.9 MG/50ML-% IV SOLN
20.0000 mg | Freq: Once | INTRAVENOUS | Status: AC
  Administered 2018-12-25: 12:00:00 20 mg via INTRAVENOUS
  Filled 2018-12-25: qty 50

## 2018-12-25 MED ORDER — SODIUM CHLORIDE 0.9 % IV SOLN
170.0000 mg | Freq: Once | INTRAVENOUS | Status: AC
  Administered 2018-12-25: 13:00:00 170 mg via INTRAVENOUS
  Filled 2018-12-25: qty 17

## 2018-12-25 MED ORDER — SODIUM CHLORIDE 0.9 % IV SOLN
Freq: Once | INTRAVENOUS | Status: AC
  Administered 2018-12-25: 11:00:00 via INTRAVENOUS
  Filled 2018-12-25: qty 250

## 2018-12-25 NOTE — Progress Notes (Signed)
Pt in for follow up, reports "rough week", states had nausea not totally relived by nausea medications.

## 2018-12-26 ENCOUNTER — Ambulatory Visit: Payer: Non-veteran care

## 2018-12-26 ENCOUNTER — Ambulatory Visit: Payer: Non-veteran care | Admitting: Oncology

## 2018-12-26 ENCOUNTER — Ambulatory Visit
Admission: RE | Admit: 2018-12-26 | Discharge: 2018-12-26 | Disposition: A | Discharge status: Home / Self Care | Payer: No Typology Code available for payment source | Source: Ambulatory Visit | Attending: Radiation Oncology | Admitting: Radiation Oncology

## 2018-12-26 ENCOUNTER — Other Ambulatory Visit: Payer: Self-pay

## 2018-12-26 ENCOUNTER — Other Ambulatory Visit: Payer: Non-veteran care

## 2018-12-26 DIAGNOSIS — Z5111 Encounter for antineoplastic chemotherapy: Secondary | ICD-10-CM | POA: Diagnosis not present

## 2018-12-27 ENCOUNTER — Other Ambulatory Visit: Payer: Self-pay

## 2018-12-27 ENCOUNTER — Ambulatory Visit
Admission: RE | Admit: 2018-12-27 | Discharge: 2018-12-27 | Disposition: A | Discharge status: Home / Self Care | Payer: No Typology Code available for payment source | Source: Ambulatory Visit | Attending: Radiation Oncology | Admitting: Radiation Oncology

## 2018-12-27 DIAGNOSIS — Z5111 Encounter for antineoplastic chemotherapy: Secondary | ICD-10-CM | POA: Diagnosis not present

## 2018-12-28 ENCOUNTER — Other Ambulatory Visit: Payer: Self-pay

## 2018-12-28 ENCOUNTER — Ambulatory Visit
Admission: RE | Admit: 2018-12-28 | Discharge: 2018-12-28 | Disposition: A | Discharge status: Home / Self Care | Payer: No Typology Code available for payment source | Source: Ambulatory Visit | Attending: Radiation Oncology | Admitting: Radiation Oncology

## 2018-12-28 DIAGNOSIS — Z5111 Encounter for antineoplastic chemotherapy: Secondary | ICD-10-CM | POA: Diagnosis not present

## 2018-12-29 ENCOUNTER — Other Ambulatory Visit: Payer: Self-pay | Admitting: Oncology

## 2018-12-29 NOTE — Progress Notes (Signed)
[name]  [phone] [phone]) [phone]  ID: [name] OB: [birth year]  MR#: [id number]  ZTI#:[id number]  Patient Care Team: Blane Ohara, MD as PCP - General (Family Medicine) Telford Nab, RN as Registered Nurse  CHIEF COMPLAINT: Clinical stage IIB squamous cell carcinoma, left upper lobe lung.  INTERVAL HISTORY: Patient returns to clinic today for further evaluation and consideration of cycle 6 of weekly carboplatinum and Taxol.  He continues to have weakness and fatigue, but otherwise feels well.  He continues to be highly anxious.  He has no neurologic complaints.  He denies any recent fevers or illnesses.  He has a good appetite and denies weight loss.  He has no chest pain, shortness of breath, cough, or hemoptysis.  He denies any nausea, vomiting, constipation, or diarrhea.  He has no urinary complaints.  Patient offers no further specific complaints today.  REVIEW OF SYSTEMS:   Review of Systems  Constitutional: Positive for malaise/fatigue. Negative for fever and weight loss.  HENT: Negative.  Negative for congestion.   Respiratory: Negative.  Negative for cough, hemoptysis and shortness of breath.   Cardiovascular: Negative.  Negative for chest pain and leg swelling.  Gastrointestinal: Negative.  Negative for abdominal pain.  Genitourinary: Negative.  Negative for dysuria.  Musculoskeletal: Negative.  Negative for back pain.  Skin: Negative.  Negative for rash.  Neurological: Positive for weakness. Negative for dizziness, focal weakness and headaches.  Endo/Heme/Allergies: Does not bruise/bleed easily.  Psychiatric/Behavioral: The patient is nervous/anxious.     As per HPI. Otherwise, a complete review of systems is negative.  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[place])    Prostate enlargement    Shortness of breath    Thyroid disease    Tobacco abuse     PAST SURGICAL HISTORY: Past Surgical History:  Procedure Laterality Date   ELECTROMAGNETIC NAVIGATION BROCHOSCOPY Left 10/19/2018   Procedure: ELECTROMAGNETIC NAVIGATION BRONCHOSCOPY LEFT;  Surgeon: Tyler Pita, MD;  Location: [facility] ORS;  Service: Cardiopulmonary;  Laterality: Left;   ENDOBRONCHIAL ULTRASOUND Left 10/19/2018   Procedure: ENDOBRONCHIAL ULTRASOUND LEFT;  Surgeon: Tyler Pita, MD;  Location: [facility] ORS;  Service: Cardiopulmonary;  Laterality: Left;   MELANOMA EXCISION Left    NO PAST SURGERIES      FAMILY HISTORY: Family History  Problem Relation Age of Onset   Aneurysm Mother    Cancer Father     ADVANCED DIRECTIVES (Y/N):  N  HEALTH MAINTENANCE: Social History   Tobacco Use   Smoking status: Former Smoker    Packs/day: 0.25    Years: 62.00    Pack years: 15.50    Types: Cigarettes    Quit date: 09/05/2018    Years since quitting: 0.3   Smokeless tobacco: Never Used  Substance Use Topics   Alcohol use: No    Alcohol/week: 0.0 standard drinks   Drug use: No     Colonoscopy:  PAP:  Bone density:  Lipid panel:  No Known Allergies  Current Outpatient Medications  Medication Sig Dispense Refill   albuterol (PROVENTIL HFA;VENTOLIN HFA) 108 (90 Base) MCG/ACT inhaler Inhale 2 puffs into the lungs every 6 (six) hours as needed for wheezing or shortness of breath. 1 Inhaler 0   ALPRAZolam (XANAX) 0.25 MG tablet  TAKE 1 TABLET (0.25 MG TOTAL) BY MOUTH 2 (TWO) TIMES DAILY AS NEEDED FOR ANXIETY. 60 tablet 1   aspirin 81 MG tablet Take 81 mg by mouth daily.     budesonide-formoterol (SYMBICORT) 160-4.5 MCG/ACT inhaler Inhale 2 puffs into the lungs 2 (two) times daily.     levothyroxine (SYNTHROID) 137 MCG tablet Take 137 mcg by mouth daily before breakfast.     ondansetron (ZOFRAN) 8 MG tablet Take 1 tablet (8 mg total) by  mouth 2 (two) times daily as needed for refractory nausea / vomiting. Start on day 3 after chemo. 30 tablet 2   prochlorperazine (COMPAZINE) 10 MG tablet Take 1 tablet (10 mg total) by mouth every 6 (six) hours as needed (Nausea or vomiting). 60 tablet 2   tamsulosin (FLOMAX) 0.4 MG CAPS capsule Take 1 capsule by mouth 1 day or 1 dose.     No current facility-administered medications for this visit.    Facility-Administered Medications Ordered in Other Visits  Medication Dose Route Frequency Provider Last Rate Last Dose   CARBOplatin (PARAPLATIN) 170 mg in sodium chloride 0.9 % 250 mL chemo infusion  170 mg Intravenous Once Lloyd Huger, MD 534 mL/hr at 01/01/19 1406 170 mg at 01/01/19 1406    OBJECTIVE: Vitals:   01/01/19 1100  BP: 129/79  Pulse: 78  Temp: (!) 96.4 F (35.8 C)  SpO2: 95%     Body mass index is 34.51 kg/m.    ECOG FS:0 - Asymptomatic  General: Well-developed, well-nourished, no acute distress. Eyes: Pink conjunctiva, anicteric sclera. HEENT: Normocephalic, moist mucous membranes. Lungs: Clear to auscultation bilaterally. Heart: Regular rate and rhythm. No rubs, murmurs, or gallops. Abdomen: Soft, nontender, nondistended. No organomegaly noted, normoactive bowel sounds. Musculoskeletal: No edema, cyanosis, or clubbing. Neuro: Alert, answering all questions appropriately. Cranial nerves grossly intact. Skin: No rashes or petechiae noted. Psych: Normal affect.  LAB RESULTS:  Lab Results  Component Value Date   NA 138 01/01/2019   K 4.2 01/01/2019   CL 107 01/01/2019   CO2 25 01/01/2019   GLUCOSE 120 (H) 01/01/2019   BUN 22 01/01/2019   CREATININE 1.55 (H) 01/01/2019   CALCIUM 8.9 01/01/2019   PROT 6.8 01/01/2019   ALBUMIN 4.1 01/01/2019   AST 25 01/01/2019   ALT 24 01/01/2019   ALKPHOS 47 01/01/2019   BILITOT 1.0 01/01/2019   GFRNONAA 43 (L) 01/01/2019   GFRAA 50 (L) 01/01/2019    Lab Results  Component Value Date   WBC 3.6 (L)  01/01/2019   NEUTROABS 2.8 01/01/2019   HGB 13.7 01/01/2019   HCT 41.0 01/01/2019   MCV 93.2 01/01/2019   PLT 96 (L) 01/01/2019     STUDIES: No results found.  ASSESSMENT: Clinical stage IIB squamous cell carcinoma, left upper lobe lung.  PLAN:    1.Clinical stage IIB squamous cell carcinoma, left upper lobe lung: PET scan results from October 08, 2018 reviewed independently.  Patient underwent biopsy with navigational bronchoscopy on October 19, 2018 confirming diagnosis.  Patient declined surgery and wished to pursue XRT along with concurrent chemotherapy.  Given his stage of disease he does not qualify for maintenance immunotherapy.  Plan to give weekly carboplatinum and Taxol throughout the duration of his XRT.  Continue daily XRT completing on January 14, 2019.  Proceed with cycle 6 of weekly carboplatinum and Taxol today despite thrombocytopenia.  Return to clinic in 1 week for further evaluation and consideration of cycle 7.   2.  History of melanoma:  Unclear stage or depth, although by report was in situ.  Patient had Mohs surgery in fall 2019.  Lung biopsy consistent with squamous cell carcinoma. 3.  Anxiety: Continue Xanax as needed. 4.  Renal insufficiency: Creatinine remains increased, but essentially stable at 1.55. 5.  Hypertension: Blood pressure is within normal limits today.   6.  Thrombocytopenia: Platelet decreased to 96, but will proceed with treatment as above. 7.  Leukopenia: Chronic and unchanged, monitor.  Patient expressed understanding and was in agreement with this plan. He also understands that He can call clinic at any time with any questions, concerns, or complaints.   Cancer Staging Squamous cell carcinoma lung, left ([place]) Staging form: Lung, AJCC 8th Edition - Clinical stage from 10/27/2018: Stage IIB (cT1c, cN1, cM0) - Signed by Lloyd Huger, MD on 11/16/2018   Lloyd Huger, MD   01/01/2019 2:14 PM

## 2018-12-31 ENCOUNTER — Other Ambulatory Visit: Payer: Self-pay

## 2018-12-31 ENCOUNTER — Ambulatory Visit
Admission: RE | Admit: 2018-12-31 | Discharge: 2018-12-31 | Disposition: A | Discharge status: Home / Self Care | Payer: No Typology Code available for payment source | Source: Ambulatory Visit | Attending: Radiation Oncology | Admitting: Radiation Oncology

## 2018-12-31 DIAGNOSIS — Z5111 Encounter for antineoplastic chemotherapy: Secondary | ICD-10-CM | POA: Diagnosis not present

## 2019-01-01 ENCOUNTER — Other Ambulatory Visit: Payer: Self-pay

## 2019-01-01 ENCOUNTER — Other Ambulatory Visit: Payer: Self-pay | Admitting: Oncology

## 2019-01-01 ENCOUNTER — Inpatient Hospital Stay: Payer: No Typology Code available for payment source

## 2019-01-01 ENCOUNTER — Inpatient Hospital Stay (HOSPITAL_BASED_OUTPATIENT_CLINIC_OR_DEPARTMENT_OTHER): Payer: No Typology Code available for payment source | Admitting: Oncology

## 2019-01-01 ENCOUNTER — Ambulatory Visit
Admission: RE | Admit: 2019-01-01 | Discharge: 2019-01-01 | Disposition: A | Discharge status: Home / Self Care | Payer: No Typology Code available for payment source | Source: Ambulatory Visit | Attending: Radiation Oncology | Admitting: Radiation Oncology

## 2019-01-01 ENCOUNTER — Encounter: Payer: Self-pay | Admitting: Oncology

## 2019-01-01 VITALS — BP 129/79 | HR 78 | Temp 96.4°F | Wt 247.4 lb

## 2019-01-01 DIAGNOSIS — C3412 Malignant neoplasm of upper lobe, left bronchus or lung: Secondary | ICD-10-CM | POA: Insufficient documentation

## 2019-01-01 DIAGNOSIS — R5383 Other fatigue: Secondary | ICD-10-CM | POA: Insufficient documentation

## 2019-01-01 DIAGNOSIS — Z809 Family history of malignant neoplasm, unspecified: Secondary | ICD-10-CM | POA: Insufficient documentation

## 2019-01-01 DIAGNOSIS — C3492 Malignant neoplasm of unspecified part of left bronchus or lung: Secondary | ICD-10-CM

## 2019-01-01 DIAGNOSIS — Z5111 Encounter for antineoplastic chemotherapy: Secondary | ICD-10-CM | POA: Insufficient documentation

## 2019-01-01 DIAGNOSIS — N289 Disorder of kidney and ureter, unspecified: Secondary | ICD-10-CM | POA: Insufficient documentation

## 2019-01-01 DIAGNOSIS — F329 Major depressive disorder, single episode, unspecified: Secondary | ICD-10-CM | POA: Insufficient documentation

## 2019-01-01 DIAGNOSIS — Z87891 Personal history of nicotine dependence: Secondary | ICD-10-CM | POA: Insufficient documentation

## 2019-01-01 DIAGNOSIS — Z79899 Other long term (current) drug therapy: Secondary | ICD-10-CM | POA: Insufficient documentation

## 2019-01-01 LAB — COMPREHENSIVE METABOLIC PANEL
ALT: 24 U/L (ref 0–44)
AST: 25 U/L (ref 15–41)
Albumin: 4.1 g/dL (ref 3.5–5.0)
Alkaline Phosphatase: 47 U/L (ref 38–126)
Anion gap: 6 (ref 5–15)
BUN: 22 mg/dL (ref 8–23)
CO2: 25 mmol/L (ref 22–32)
Calcium: 8.9 mg/dL (ref 8.9–10.3)
Chloride: 107 mmol/L (ref 98–111)
Creatinine, Ser: 1.55 mg/dL — ABNORMAL HIGH (ref 0.61–1.24)
GFR calc Af Amer: 50 mL/min — ABNORMAL LOW (ref >60)
GFR calc non Af Amer: 43 mL/min — ABNORMAL LOW (ref >60)
Glucose, Bld: 120 mg/dL — ABNORMAL HIGH (ref 70–99)
Potassium: 4.2 mmol/L (ref 3.5–5.1)
Sodium: 138 mmol/L (ref 135–145)
Total Bilirubin: 1 mg/dL (ref 0.3–1.2)
Total Protein: 6.8 g/dL (ref 6.5–8.1)

## 2019-01-01 LAB — CBC WITH DIFFERENTIAL/PLATELET
Abs Immature Granulocytes: 0.02 10*3/uL (ref 0.00–0.07)
Basophils Absolute: 0 10*3/uL (ref 0.0–0.1)
Basophils Relative: 1 %
Eosinophils Absolute: 0 10*3/uL (ref 0.0–0.5)
Eosinophils Relative: 1 %
HCT: 41 % (ref 39.0–52.0)
Hemoglobin: 13.7 g/dL (ref 13.0–17.0)
Immature Granulocytes: 1 %
Lymphocytes Relative: 13 %
Lymphs Abs: 0.5 10*3/uL — ABNORMAL LOW (ref 0.7–4.0)
MCH: 31.1 pg (ref 26.0–34.0)
MCHC: 33.4 g/dL (ref 30.0–36.0)
MCV: 93.2 fL (ref 80.0–100.0)
Monocytes Absolute: 0.3 10*3/uL (ref 0.1–1.0)
Monocytes Relative: 7 %
Neutro Abs: 2.8 10*3/uL (ref 1.7–7.7)
Neutrophils Relative %: 77 %
Platelets: 96 10*3/uL — ABNORMAL LOW (ref 150–400)
RBC: 4.4 MIL/uL (ref 4.22–5.81)
RDW: 14.4 % (ref 11.5–15.5)
WBC: 3.6 10*3/uL — ABNORMAL LOW (ref 4.0–10.5)
nRBC: 0 % (ref 0.0–0.2)

## 2019-01-01 MED ORDER — DOXYCYCLINE HYCLATE 100 MG PO CAPS: 100.0000 mg | ORAL_CAPSULE | Freq: Two times a day (BID) | ORAL | 0 refills | Status: DC

## 2019-01-01 MED ORDER — SODIUM CHLORIDE 0.9 % IV SOLN
Freq: Once | INTRAVENOUS | Status: AC
  Administered 2019-01-01: 12:00:00 via INTRAVENOUS
  Filled 2019-01-01: qty 250

## 2019-01-01 MED ORDER — SODIUM CHLORIDE 0.9 % IV SOLN
45.0000 mg/m2 | Freq: Once | INTRAVENOUS | Status: AC
  Administered 2019-01-01: 13:00:00 108 mg via INTRAVENOUS
  Filled 2019-01-01: qty 18

## 2019-01-01 MED ORDER — FAMOTIDINE IN NACL 20-0.9 MG/50ML-% IV SOLN
20.0000 mg | Freq: Once | INTRAVENOUS | Status: AC
  Administered 2019-01-01: 20 mg via INTRAVENOUS
  Filled 2019-01-01: qty 50

## 2019-01-01 MED ORDER — DIPHENHYDRAMINE HCL 50 MG/ML IJ SOLN
25.0000 mg | Freq: Once | INTRAMUSCULAR | Status: AC
  Administered 2019-01-01: 12:00:00 25 mg via INTRAVENOUS
  Filled 2019-01-01: qty 1

## 2019-01-01 MED ORDER — PALONOSETRON HCL INJECTION 0.25 MG/5ML
0.2500 mg | Freq: Once | INTRAVENOUS | Status: AC
  Administered 2019-01-01: 12:00:00 0.25 mg via INTRAVENOUS
  Filled 2019-01-01: qty 5

## 2019-01-01 MED ORDER — SODIUM CHLORIDE 0.9 % IV SOLN
170.0000 mg | Freq: Once | INTRAVENOUS | Status: AC
  Administered 2019-01-01: 14:00:00 170 mg via INTRAVENOUS
  Filled 2019-01-01: qty 17

## 2019-01-01 MED ORDER — DEXAMETHASONE SODIUM PHOSPHATE 10 MG/ML IJ SOLN
10.0000 mg | Freq: Once | INTRAMUSCULAR | Status: AC
  Administered 2019-01-01: 12:00:00 10 mg via INTRAVENOUS
  Filled 2019-01-01: qty 1

## 2019-01-01 NOTE — Progress Notes (Signed)
Patient here today for follow up.  Patient denies any nausea, vomiting, diarrhea, constipation or pain.  Patient c/o SOB and cough.

## 2019-01-02 ENCOUNTER — Other Ambulatory Visit: Payer: Self-pay

## 2019-01-02 ENCOUNTER — Ambulatory Visit
Admission: RE | Admit: 2019-01-02 | Discharge: 2019-01-02 | Disposition: A | Discharge status: Home / Self Care | Payer: No Typology Code available for payment source | Source: Ambulatory Visit | Attending: Radiation Oncology | Admitting: Radiation Oncology

## 2019-01-02 DIAGNOSIS — R5383 Other fatigue: Secondary | ICD-10-CM | POA: Diagnosis not present

## 2019-01-03 ENCOUNTER — Other Ambulatory Visit: Payer: Self-pay

## 2019-01-03 ENCOUNTER — Ambulatory Visit
Admission: RE | Admit: 2019-01-03 | Discharge: 2019-01-03 | Disposition: A | Discharge status: Home / Self Care | Payer: No Typology Code available for payment source | Source: Ambulatory Visit | Attending: Radiation Oncology | Admitting: Radiation Oncology

## 2019-01-03 DIAGNOSIS — R5383 Other fatigue: Secondary | ICD-10-CM | POA: Diagnosis not present

## 2019-01-04 ENCOUNTER — Ambulatory Visit
Admission: RE | Admit: 2019-01-04 | Discharge: 2019-01-04 | Disposition: A | Discharge status: Home / Self Care | Payer: No Typology Code available for payment source | Source: Ambulatory Visit | Attending: Radiation Oncology | Admitting: Radiation Oncology

## 2019-01-04 ENCOUNTER — Other Ambulatory Visit: Payer: Self-pay

## 2019-01-04 DIAGNOSIS — R5383 Other fatigue: Secondary | ICD-10-CM | POA: Diagnosis not present

## 2019-01-04 NOTE — Progress Notes (Signed)
[place]  Telephone:([phone]) [phone] Fax:(336) [phone]  ID: Levi [name] OB: 19-Jul-1940  MR#: [id number]  FIE#:[id number]  Patient Care Team: Blane Ohara, MD as PCP - General (Family Medicine) Telford Nab, RN as Registered Nurse  CHIEF COMPLAINT: Clinical stage IIB squamous cell carcinoma, left upper lobe lung.  INTERVAL HISTORY: Patient returns to clinic today for further evaluation and consideration of cycle 7 and final weekly carboplatinum and Taxol.  He continues to have increased weakness and fatigue, but otherwise feels well.  The rash on his bilateral arms is unchanged despite treatment with doxycycline.  He continues to be highly anxious.  He has occasional nosebleeds.  He has no neurologic complaints.  He denies any recent fevers or illnesses.  He has a good appetite and denies weight loss.  He has no chest pain, shortness of breath, cough, or hemoptysis.  He denies any nausea, vomiting, constipation, or diarrhea.  He has no urinary complaints.  Patient offers no further specific complaints today.  REVIEW OF SYSTEMS:   Review of Systems  Constitutional: Positive for malaise/fatigue. Negative for fever and weight loss.  HENT: Negative.  Negative for congestion.   Respiratory: Negative.  Negative for cough, hemoptysis and shortness of breath.   Cardiovascular: Negative.  Negative for chest pain and leg swelling.  Gastrointestinal: Negative.  Negative for abdominal pain.  Genitourinary: Negative.  Negative for dysuria.  Musculoskeletal: Negative.  Negative for back pain.  Skin: Positive for rash.  Neurological: Positive for weakness. Negative for dizziness, focal weakness and headaches.  Endo/Heme/Allergies: Does not bruise/bleed easily.  Psychiatric/Behavioral: The patient is nervous/anxious.     As per HPI. Otherwise, a complete review of systems is negative.  PAST MEDICAL HISTORY: Past Medical History:  Diagnosis Date  . Asthma   . COPD (chronic  obstructive pulmonary disease) ([place])   . Hearing loss   . Hypothyroidism   . Mass of left lung   . 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[place])   . Prostate enlargement   . Shortness of breath   . Thyroid disease   . Tobacco abuse     PAST SURGICAL HISTORY: Past Surgical History:  Procedure Laterality Date  . ELECTROMAGNETIC NAVIGATION BROCHOSCOPY Left 10/19/2018   Procedure: ELECTROMAGNETIC NAVIGATION BRONCHOSCOPY LEFT;  Surgeon: Tyler Pita, MD;  Location: [facility] ORS;  Service: Cardiopulmonary;  Laterality: Left;  . ENDOBRONCHIAL ULTRASOUND Left 10/19/2018   Procedure: ENDOBRONCHIAL ULTRASOUND LEFT;  Surgeon: Tyler Pita, MD;  Location: [facility] ORS;  Service: Cardiopulmonary;  Laterality: Left;  Marland Kitchen MELANOMA EXCISION Left   . NO PAST SURGERIES      FAMILY HISTORY: Family History  Problem Relation Age of Onset  . Aneurysm Mother   . Cancer Father     ADVANCED DIRECTIVES (Y/N):  N  HEALTH MAINTENANCE: Social History   Tobacco Use  . Smoking status: Former Smoker    Packs/day: 0.25    Years: 62.00    Pack years: 15.50    Types: Cigarettes    Quit date: 09/05/2018    Years since quitting: 0.3  . Smokeless tobacco: Never Used  Substance Use Topics  . Alcohol use: No    Alcohol/week: 0.0 standard drinks  . Drug use: No     Colonoscopy:  PAP:  Bone density:  Lipid panel:  No Known Allergies  Current Outpatient Medications  Medication Sig Dispense Refill  . albuterol (PROVENTIL HFA;VENTOLIN HFA) 108 (90 Base) MCG/ACT inhaler Inhale 2 puffs into the lungs every 6 (six) hours  as needed for wheezing or shortness of breath. 1 Inhaler 0  . ALPRAZolam (XANAX) 0.25 MG tablet TAKE 1 TABLET (0.25 MG TOTAL) BY MOUTH 2 (TWO) TIMES DAILY AS NEEDED FOR ANXIETY. 60 tablet 1  . aspirin 81 MG tablet Take 81 mg by mouth daily.    . budesonide-formoterol (SYMBICORT) 160-4.5 MCG/ACT inhaler Inhale 2 puffs into the lungs 2 (two) times daily.    Marland Kitchen doxycycline (VIBRAMYCIN) 100 MG capsule Take  1 capsule (100 mg total) by mouth 2 (two) times daily. 14 capsule 0  . levothyroxine (SYNTHROID) 137 MCG tablet Take 137 mcg by mouth daily before breakfast.    . ondansetron (ZOFRAN) 8 MG tablet Take 1 tablet (8 mg total) by mouth 2 (two) times daily as needed for refractory nausea / vomiting. Start on day 3 after chemo. 30 tablet 2  . prochlorperazine (COMPAZINE) 10 MG tablet Take 1 tablet (10 mg total) by mouth every 6 (six) hours as needed (Nausea or vomiting). 60 tablet 2  . tamsulosin (FLOMAX) 0.4 MG CAPS capsule Take 1 capsule by mouth 1 day or 1 dose.     No current facility-administered medications for this visit.     OBJECTIVE: Vitals:   01/09/19 1203  BP: (!) 155/90  Pulse: 80  Temp: 97.8 F (36.6 C)     Body mass index is 34.25 kg/m.    ECOG FS:0 - Asymptomatic  General: Well-developed, well-nourished, no acute distress. Eyes: Pink conjunctiva, anicteric sclera. HEENT: Normocephalic, moist mucous membranes. Lungs: Clear to auscultation bilaterally. Heart: Regular rate and rhythm. No rubs, murmurs, or gallops. Abdomen: Soft, nontender, nondistended. No organomegaly noted, normoactive bowel sounds. Musculoskeletal: No edema, cyanosis, or clubbing. Neuro: Alert, answering all questions appropriately. Cranial nerves grossly intact. Skin: Rash on bilateral forearms unchanged. Psych: Normal affect.  LAB RESULTS:  Lab Results  Component Value Date   NA 140 01/09/2019   K 4.1 01/09/2019   CL 108 01/09/2019   CO2 25 01/09/2019   GLUCOSE 124 (H) 01/09/2019   BUN 19 01/09/2019   CREATININE 1.51 (H) 01/09/2019   CALCIUM 9.1 01/09/2019   PROT 6.6 01/09/2019   ALBUMIN 4.0 01/09/2019   AST 25 01/09/2019   ALT 26 01/09/2019   ALKPHOS 50 01/09/2019   BILITOT 0.9 01/09/2019   GFRNONAA 44 (L) 01/09/2019   GFRAA 51 (L) 01/09/2019    Lab Results  Component Value Date   WBC 2.0 (L) 01/09/2019   NEUTROABS 1.4 (L) 01/09/2019   HGB 13.5 01/09/2019   HCT 40.5 01/09/2019    MCV 93.3 01/09/2019   PLT 115 (L) 01/09/2019     STUDIES: No results found.  ASSESSMENT: Clinical stage IIB squamous cell carcinoma, left upper lobe lung.  PLAN:    1.Clinical stage IIB squamous cell carcinoma, left upper lobe lung: PET scan results from October 08, 2018 reviewed independently.  Patient underwent biopsy with navigational bronchoscopy on October 19, 2018 confirming diagnosis.  Patient declined surgery and wished to pursue XRT along with concurrent chemotherapy.  Given his stage of disease he does not qualify for maintenance immunotherapy.  Plan to give weekly carboplatinum and Taxol throughout the duration of his XRT.  Continue daily XRT completing on January 14, 2019.  Proceed with cycle 7 and final treatment of weekly carboplatinum and Taxol today.  Return to clinic in 5 weeks, or 1 month after completing XRT for repeat laboratory work and further evaluation.  Will schedule repeat imaging at that clinic visit.   2.  History of  melanoma: Unclear stage or depth, although by report was in situ.  Patient had Mohs surgery in fall 2019.  Lung biopsy consistent with squamous cell carcinoma. 3.  Anxiety: Continue Xanax as needed. 4.  Renal insufficiency: Creatinine remains essentially unchanged at 1.51.  Monitor. 5.  Hypertension: Blood pressure is mildly elevated today.  Proceed with treatment as above.  6.  Thrombocytopenia: Decreased, but improved.  Proceed with treatment. 7.  Leukopenia: Patient's white blood cell count has trended down, but will proceed with his final treatment as scheduled.  Repeat laboratory work in 1 month as above.  Patient expressed understanding and was in agreement with this plan. He also understands that He can call clinic at any time with any questions, concerns, or complaints.   Cancer Staging Squamous cell carcinoma lung, left ([place]) Staging form: Lung, AJCC 8th Edition - Clinical stage from 10/27/2018: Stage IIB (cT1c, cN1, cM0) - Signed by Lloyd Huger, MD on 11/16/2018   Lloyd Huger, MD   01/09/2019 4:35 PM

## 2019-01-08 ENCOUNTER — Ambulatory Visit
Admission: RE | Admit: 2019-01-08 | Discharge: 2019-01-08 | Disposition: A | Discharge status: Home / Self Care | Payer: No Typology Code available for payment source | Source: Ambulatory Visit | Attending: Radiation Oncology | Admitting: Radiation Oncology

## 2019-01-08 ENCOUNTER — Other Ambulatory Visit: Payer: Self-pay

## 2019-01-08 DIAGNOSIS — R5383 Other fatigue: Secondary | ICD-10-CM | POA: Diagnosis not present

## 2019-01-08 NOTE — Progress Notes (Signed)
Pre Screening completed no changes since last appointment.

## 2019-01-09 ENCOUNTER — Ambulatory Visit
Admission: RE | Admit: 2019-01-09 | Discharge: 2019-01-09 | Disposition: A | Discharge status: Home / Self Care | Payer: No Typology Code available for payment source | Source: Ambulatory Visit | Attending: Radiation Oncology | Admitting: Radiation Oncology

## 2019-01-09 ENCOUNTER — Inpatient Hospital Stay: Payer: No Typology Code available for payment source

## 2019-01-09 ENCOUNTER — Other Ambulatory Visit: Payer: Self-pay

## 2019-01-09 ENCOUNTER — Encounter: Payer: Self-pay | Admitting: *Deleted

## 2019-01-09 ENCOUNTER — Inpatient Hospital Stay (HOSPITAL_BASED_OUTPATIENT_CLINIC_OR_DEPARTMENT_OTHER): Payer: No Typology Code available for payment source | Admitting: Oncology

## 2019-01-09 VITALS — BP 155/90 | HR 80 | Temp 97.8°F | Wt 245.6 lb

## 2019-01-09 DIAGNOSIS — R5383 Other fatigue: Secondary | ICD-10-CM | POA: Diagnosis not present

## 2019-01-09 DIAGNOSIS — C3492 Malignant neoplasm of unspecified part of left bronchus or lung: Secondary | ICD-10-CM

## 2019-01-09 LAB — CBC WITH DIFFERENTIAL/PLATELET
Abs Immature Granulocytes: 0.01 10*3/uL (ref 0.00–0.07)
Basophils Absolute: 0 10*3/uL (ref 0.0–0.1)
Basophils Relative: 1 %
Eosinophils Absolute: 0 10*3/uL (ref 0.0–0.5)
Eosinophils Relative: 1 %
HCT: 40.5 % (ref 39.0–52.0)
Hemoglobin: 13.5 g/dL (ref 13.0–17.0)
Immature Granulocytes: 1 %
Lymphocytes Relative: 21 %
Lymphs Abs: 0.4 10*3/uL — ABNORMAL LOW (ref 0.7–4.0)
MCH: 31.1 pg (ref 26.0–34.0)
MCHC: 33.3 g/dL (ref 30.0–36.0)
MCV: 93.3 fL (ref 80.0–100.0)
Monocytes Absolute: 0.2 10*3/uL (ref 0.1–1.0)
Monocytes Relative: 9 %
Neutro Abs: 1.4 10*3/uL — ABNORMAL LOW (ref 1.7–7.7)
Neutrophils Relative %: 67 %
Platelets: 115 10*3/uL — ABNORMAL LOW (ref 150–400)
RBC: 4.34 MIL/uL (ref 4.22–5.81)
RDW: 15 % (ref 11.5–15.5)
WBC: 2 10*3/uL — ABNORMAL LOW (ref 4.0–10.5)
nRBC: 0 % (ref 0.0–0.2)

## 2019-01-09 LAB — COMPREHENSIVE METABOLIC PANEL
ALT: 26 U/L (ref 0–44)
AST: 25 U/L (ref 15–41)
Albumin: 4 g/dL (ref 3.5–5.0)
Alkaline Phosphatase: 50 U/L (ref 38–126)
Anion gap: 7 (ref 5–15)
BUN: 19 mg/dL (ref 8–23)
CO2: 25 mmol/L (ref 22–32)
Calcium: 9.1 mg/dL (ref 8.9–10.3)
Chloride: 108 mmol/L (ref 98–111)
Creatinine, Ser: 1.51 mg/dL — ABNORMAL HIGH (ref 0.61–1.24)
GFR calc Af Amer: 51 mL/min — ABNORMAL LOW (ref >60)
GFR calc non Af Amer: 44 mL/min — ABNORMAL LOW (ref >60)
Glucose, Bld: 124 mg/dL — ABNORMAL HIGH (ref 70–99)
Potassium: 4.1 mmol/L (ref 3.5–5.1)
Sodium: 140 mmol/L (ref 135–145)
Total Bilirubin: 0.9 mg/dL (ref 0.3–1.2)
Total Protein: 6.6 g/dL (ref 6.5–8.1)

## 2019-01-09 MED ORDER — DIPHENHYDRAMINE HCL 50 MG/ML IJ SOLN
25.0000 mg | Freq: Once | INTRAMUSCULAR | Status: AC
  Administered 2019-01-09: 13:00:00 25 mg via INTRAVENOUS
  Filled 2019-01-09: qty 1

## 2019-01-09 MED ORDER — SODIUM CHLORIDE 0.9 % IV SOLN
45.0000 mg/m2 | Freq: Once | INTRAVENOUS | Status: AC
  Administered 2019-01-09: 13:00:00 108 mg via INTRAVENOUS
  Filled 2019-01-09: qty 18

## 2019-01-09 MED ORDER — SODIUM CHLORIDE 0.9 % IV SOLN
Freq: Once | INTRAVENOUS | Status: AC
  Administered 2019-01-09: 13:00:00 via INTRAVENOUS
  Filled 2019-01-09: qty 250

## 2019-01-09 MED ORDER — SODIUM CHLORIDE 0.9 % IV SOLN
170.0000 mg | Freq: Once | INTRAVENOUS | Status: AC
  Administered 2019-01-09: 170 mg via INTRAVENOUS
  Filled 2019-01-09: qty 17

## 2019-01-09 MED ORDER — PALONOSETRON HCL INJECTION 0.25 MG/5ML
0.2500 mg | Freq: Once | INTRAVENOUS | Status: AC
  Administered 2019-01-09: 0.25 mg via INTRAVENOUS
  Filled 2019-01-09: qty 5

## 2019-01-09 MED ORDER — DEXAMETHASONE SODIUM PHOSPHATE 10 MG/ML IJ SOLN
10.0000 mg | Freq: Once | INTRAMUSCULAR | Status: AC
  Administered 2019-01-09: 13:00:00 10 mg via INTRAVENOUS
  Filled 2019-01-09: qty 1

## 2019-01-09 MED ORDER — FAMOTIDINE IN NACL 20-0.9 MG/50ML-% IV SOLN
20.0000 mg | Freq: Once | INTRAVENOUS | Status: AC
  Administered 2019-01-09: 20 mg via INTRAVENOUS
  Filled 2019-01-09: qty 50

## 2019-01-09 NOTE — Progress Notes (Signed)
Patient c/o nose bleeds for approximately 7 wks, worse in the mornings.

## 2019-01-10 ENCOUNTER — Other Ambulatory Visit: Payer: Self-pay

## 2019-01-10 ENCOUNTER — Ambulatory Visit
Admission: RE | Admit: 2019-01-10 | Discharge: 2019-01-10 | Disposition: A | Discharge status: Home / Self Care | Payer: No Typology Code available for payment source | Source: Ambulatory Visit | Attending: Radiation Oncology | Admitting: Radiation Oncology

## 2019-01-10 DIAGNOSIS — R5383 Other fatigue: Secondary | ICD-10-CM | POA: Diagnosis not present

## 2019-01-11 ENCOUNTER — Ambulatory Visit
Admission: RE | Admit: 2019-01-11 | Discharge: 2019-01-11 | Disposition: A | Discharge status: Home / Self Care | Payer: No Typology Code available for payment source | Source: Ambulatory Visit | Attending: Radiation Oncology | Admitting: Radiation Oncology

## 2019-01-11 ENCOUNTER — Other Ambulatory Visit: Payer: Self-pay

## 2019-01-11 DIAGNOSIS — R5383 Other fatigue: Secondary | ICD-10-CM | POA: Diagnosis not present

## 2019-01-14 ENCOUNTER — Ambulatory Visit
Admission: RE | Admit: 2019-01-14 | Discharge: 2019-01-14 | Disposition: A | Discharge status: Home / Self Care | Payer: No Typology Code available for payment source | Source: Ambulatory Visit | Attending: Radiation Oncology | Admitting: Radiation Oncology

## 2019-01-14 ENCOUNTER — Other Ambulatory Visit: Payer: Self-pay

## 2019-01-14 DIAGNOSIS — R5383 Other fatigue: Secondary | ICD-10-CM | POA: Diagnosis not present

## 2019-01-24 ENCOUNTER — Telehealth: Payer: Self-pay | Admitting: *Deleted

## 2019-01-24 DIAGNOSIS — R5383 Other fatigue: Secondary | ICD-10-CM

## 2019-01-24 NOTE — Telephone Encounter (Signed)
Pt called in to report continues to feel fatigued, nauseated, and anxious after completing treatment on 9/14. Per Dr. Grayland Ormond, pt will need to come in for evaluation in [facility] with labs. Pt scheduled for 9/25 at 9am. Pt is aware of appt and verbalized understanding.

## 2019-01-25 ENCOUNTER — Other Ambulatory Visit: Payer: Self-pay

## 2019-01-25 ENCOUNTER — Inpatient Hospital Stay (HOSPITAL_BASED_OUTPATIENT_CLINIC_OR_DEPARTMENT_OTHER): Payer: No Typology Code available for payment source | Admitting: Nurse Practitioner

## 2019-01-25 ENCOUNTER — Inpatient Hospital Stay: Payer: No Typology Code available for payment source

## 2019-01-25 ENCOUNTER — Encounter: Payer: Self-pay | Admitting: Nurse Practitioner

## 2019-01-25 VITALS — BP 126/87 | HR 81 | Temp 98.6°F | Resp 22 | Wt 242.0 lb

## 2019-01-25 DIAGNOSIS — R5383 Other fatigue: Secondary | ICD-10-CM

## 2019-01-25 DIAGNOSIS — N990 Postprocedural (acute) (chronic) kidney failure: Secondary | ICD-10-CM | POA: Insufficient documentation

## 2019-01-25 DIAGNOSIS — F329 Major depressive disorder, single episode, unspecified: Secondary | ICD-10-CM | POA: Diagnosis not present

## 2019-01-25 DIAGNOSIS — N289 Disorder of kidney and ureter, unspecified: Secondary | ICD-10-CM

## 2019-01-25 LAB — CBC WITH DIFFERENTIAL/PLATELET
Abs Immature Granulocytes: 0.02 10*3/uL (ref 0.00–0.07)
Basophils Absolute: 0 10*3/uL (ref 0.0–0.1)
Basophils Relative: 0 %
Eosinophils Absolute: 0 10*3/uL (ref 0.0–0.5)
Eosinophils Relative: 0 %
HCT: 37.8 % — ABNORMAL LOW (ref 39.0–52.0)
Hemoglobin: 12.4 g/dL — ABNORMAL LOW (ref 13.0–17.0)
Immature Granulocytes: 1 %
Lymphocytes Relative: 18 %
Lymphs Abs: 0.4 10*3/uL — ABNORMAL LOW (ref 0.7–4.0)
MCH: 31.6 pg (ref 26.0–34.0)
MCHC: 32.8 g/dL (ref 30.0–36.0)
MCV: 96.4 fL (ref 80.0–100.0)
Monocytes Absolute: 0.5 10*3/uL (ref 0.1–1.0)
Monocytes Relative: 22 %
Neutro Abs: 1.3 10*3/uL — ABNORMAL LOW (ref 1.7–7.7)
Neutrophils Relative %: 59 %
Platelets: 162 10*3/uL (ref 150–400)
RBC: 3.92 MIL/uL — ABNORMAL LOW (ref 4.22–5.81)
RDW: 17.8 % — ABNORMAL HIGH (ref 11.5–15.5)
WBC: 2.3 10*3/uL — ABNORMAL LOW (ref 4.0–10.5)
nRBC: 0 % (ref 0.0–0.2)

## 2019-01-25 LAB — COMPREHENSIVE METABOLIC PANEL
ALT: 20 U/L (ref 0–44)
AST: 21 U/L (ref 15–41)
Albumin: 3.8 g/dL (ref 3.5–5.0)
Alkaline Phosphatase: 48 U/L (ref 38–126)
Anion gap: 7 (ref 5–15)
BUN: 22 mg/dL (ref 8–23)
CO2: 24 mmol/L (ref 22–32)
Calcium: 8.6 mg/dL — ABNORMAL LOW (ref 8.9–10.3)
Chloride: 108 mmol/L (ref 98–111)
Creatinine, Ser: 1.54 mg/dL — ABNORMAL HIGH (ref 0.61–1.24)
GFR calc Af Amer: 50 mL/min — ABNORMAL LOW (ref >60)
GFR calc non Af Amer: 43 mL/min — ABNORMAL LOW (ref >60)
Glucose, Bld: 122 mg/dL — ABNORMAL HIGH (ref 70–99)
Potassium: 4.2 mmol/L (ref 3.5–5.1)
Sodium: 139 mmol/L (ref 135–145)
Total Bilirubin: 0.8 mg/dL (ref 0.3–1.2)
Total Protein: 6.7 g/dL (ref 6.5–8.1)

## 2019-01-25 LAB — TSH: TSH: 2.34 u[IU]/mL (ref 0.350–4.500)

## 2019-01-25 MED ORDER — SODIUM CHLORIDE 0.9 % IV SOLN
Freq: Once | INTRAVENOUS | Status: AC
  Administered 2019-01-25: 10:00:00 via INTRAVENOUS
  Filled 2019-01-25: qty 250

## 2019-01-25 MED ORDER — ESCITALOPRAM OXALATE 10 MG PO TABS: 10.0000 mg | ORAL_TABLET | Freq: Every day | ORAL | 0 refills | Status: DC

## 2019-01-25 NOTE — Progress Notes (Signed)
Symptom Management [facility]  [phone] [phone]  Patient Care Team: Blane Ohara, MD as PCP - General (Family Medicine) Telford Nab, RN as Registered Nurse   Name of the patient: Levi [name]  [id number]  [birth year]   Date of visit: 01/25/19  Diagnosis- stage II lung cancer  Chief complaint/ Reason for visit- depression   Heme/Onc history:  Oncology History  Squamous cell carcinoma lung, left ([place])  10/27/2018 Initial Diagnosis   Squamous cell carcinoma lung, left ([place])   10/27/2018 Cancer Staging   Staging form: Lung, AJCC 8th Edition - Clinical stage from 10/27/2018: Stage IIB (cT1c, cN1, cM0) - Signed by Lloyd Huger, MD on 11/16/2018   11/27/2018 -  Chemotherapy   The patient had palonosetron (ALOXI) injection 0.25 mg, 0.25 mg, Intravenous,  Once, 7 of 8 cycles Administration: 0.25 mg (11/27/2018), 0.25 mg (12/04/2018), 0.25 mg (12/25/2018), 0.25 mg (01/01/2019), 0.25 mg (01/09/2019), 0.25 mg (12/11/2018), 0.25 mg (12/19/2018) CARBOplatin (PARAPLATIN) 190 mg in sodium chloride 0.9 % 100 mL chemo infusion, 190 mg (101 % of original dose 188 mg), Intravenous,  Once, 7 of 8 cycles Dose modification:   (original dose 188 mg, Cycle 1) Administration: 190 mg (11/27/2018), 170 mg (12/04/2018), 170 mg (12/25/2018), 170 mg (01/01/2019), 170 mg (01/09/2019), 170 mg (12/11/2018), 170 mg (12/19/2018) PACLitaxel (TAXOL) 108 mg in sodium chloride 0.9 % 250 mL chemo infusion (</= 80mg /m2), 45 mg/m2 = 108 mg, Intravenous,  Once, 7 of 8 cycles Administration: 108 mg (11/27/2018), 108 mg (12/04/2018), 108 mg (12/25/2018), 108 mg (01/01/2019), 108 mg (01/09/2019), 108 mg (12/11/2018), 108 mg (12/19/2018)  for chemotherapy treatment.      Interval history- Levi [name], 78 year old male, s/p carbo-taxol chemotherapy for treatment of stage II lung cancer presents to symptom management clinic for complaints of depression.  He complains of depressed mood,  fatigue and crying, anxiety, poor appetite. Onset was approximately 4 weeks ago, gradually worsening since that time.   He denies current suicidal and homicidal plan or intent.    Possible organic causes contributing are: medications, endocrine/metabolic, nutritional. TSH today pending. Risk factors: recent diagnosis & treatment of lung cancer  Previous treatment includes Xanax which he takes approxiamtely once a day for anxiety.  He complains of the following side effects from the treatment: none. He says that he had never been sick and the diagnosis of cancer was a shock to him and he didn't cope well when he felt poorly physically. Not being able to 'be himself' and a 'go getter'. He said he thought things would improve after he completed treatment but says he hasn't bounced back. He says he 'cries for no reason', 'feels depressed', 'anxiety is off the charts', 'feels crummy', 'nauseous', and 'don't feel like eating'.  Denies any neurologic complaints. Denies recent fevers or illnesses. Denies any easy bleeding or bruising. Denies chest pain. Denies any vomiting, constipation, or diarrhea. Denies urinary complaints. Patient offers no further specific complaints today. His wife Levi [name] participates in the visit by phone and contributes to history.   ECOG FS:2 - Symptomatic, <50% confined to bed  Review of systems- Review of Systems  Constitutional: Positive for malaise/fatigue and weight loss. Negative for chills and fever.  HENT: Negative for hearing loss, nosebleeds, sore throat and tinnitus.   Eyes: Negative for blurred vision and double vision.  Respiratory: Positive for shortness of breath (with exertion; chronic & unchanged). Negative for cough, hemoptysis and wheezing.   Cardiovascular: Negative for chest  pain, palpitations and leg swelling.  Gastrointestinal: Positive for nausea. Negative for abdominal pain, blood in stool, constipation, diarrhea, melena and vomiting.  Genitourinary: Negative  for dysuria and urgency.  Musculoskeletal: Negative for back pain, falls, joint pain and myalgias.  Skin: Negative for itching and rash.  Neurological: Negative for dizziness, tingling, sensory change, loss of consciousness, weakness and headaches.  Endo/Heme/Allergies: Negative for environmental allergies. Does not bruise/bleed easily.  Psychiatric/Behavioral: Positive for depression. Negative for hallucinations, memory loss, substance abuse and suicidal ideas. The patient is nervous/anxious. The patient does not have insomnia.      Current treatment- s/p 7 cycles of carbo-taxol (last treatment on 01/09/2019) and radiation (last treatment 01/14/2019)  No Known Allergies  Past Medical History:  Diagnosis Date  . Asthma   . COPD (chronic obstructive pulmonary disease) ([place])   . Hearing loss   . Hypothyroidism   . Mass of left lung   . 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 Prostate enlargement   . Shortness of breath   . Thyroid disease   . Tobacco abuse     Past Surgical History:  Procedure Laterality Date  . ELECTROMAGNETIC NAVIGATION BROCHOSCOPY Left 10/19/2018   Procedure: ELECTROMAGNETIC NAVIGATION BRONCHOSCOPY LEFT;  Surgeon: Tyler Pita, MD;  Location: [facility] ORS;  Service: Cardiopulmonary;  Laterality: Left;  . ENDOBRONCHIAL ULTRASOUND Left 10/19/2018   Procedure: ENDOBRONCHIAL ULTRASOUND LEFT;  Surgeon: Tyler Pita, MD;  Location: [facility] ORS;  Service: Cardiopulmonary;  Laterality: Left;  Marland Kitchen MELANOMA EXCISION Left   . NO PAST SURGERIES      Social History   Socioeconomic History  . Marital status: Married    Spouse name: Levi [name]  . Number of children: 5  . Years of education: Not on file  . Highest education level: Not on file  Occupational History  . Not on file  Social Needs  . Financial resource strain: Not very hard  . Food insecurity    Worry: Never true    Inability: Never true  . Transportation needs    Medical: No    Non-medical: No  Tobacco Use  .  Smoking status: Former Smoker    Packs/day: 0.25    Years: 62.00    Pack years: 15.50    Types: Cigarettes    Quit date: 09/05/2018    Years since quitting: 0.3  . Smokeless tobacco: Never Used  Substance and Sexual Activity  . Alcohol use: No    Alcohol/week: 0.0 standard drinks  . Drug use: No  . Sexual activity: Not on file  Lifestyle  . Physical activity    Days per week: Not on file    Minutes per session: Not on file  . Stress: Very much  Relationships  . Social connections    Talks on phone: More than three times a week    Gets together: Once a week    Attends religious service: Not on file    Active member of club or organization: Not on file    Attends meetings of clubs or organizations: Not on file    Relationship status: Not on file  . Intimate partner violence    Fear of current or ex partner: Not on file    Emotionally abused: Not on file    Physically abused: Not on file    Forced sexual activity: Not on file  Other Topics Concern  . 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 He is married to his wife of 30+ years, Levi [name] and has 5 children (1 deceased), many grand children, and one great grand child.     Family History  Problem Relation Age of Onset  . Aneurysm Mother   . Cancer Father      Current Outpatient Medications:  .  albuterol (PROVENTIL HFA;VENTOLIN HFA) 108 (90 Base) MCG/ACT inhaler, Inhale 2 puffs into the lungs every 6 (six) hours as needed for wheezing or shortness of breath., Disp: 1 Inhaler, Rfl: 0 .  ALPRAZolam (XANAX) 0.25 MG tablet, TAKE 1 TABLET (0.25 MG TOTAL) BY MOUTH 2 (TWO) TIMES DAILY AS NEEDED FOR ANXIETY., Disp: 60 tablet, Rfl: 1 .  aspirin 81 MG tablet, Take 81 mg by mouth daily., Disp: , Rfl:  .  budesonide-formoterol  (SYMBICORT) 160-4.5 MCG/ACT inhaler, Inhale 2 puffs into the lungs 2 (two) times daily., Disp: , Rfl:  .  levothyroxine (SYNTHROID) 137 MCG tablet, Take 137 mcg by mouth daily before breakfast., Disp: , Rfl:  .  ondansetron (ZOFRAN) 8 MG tablet, Take 1 tablet (8 mg total) by mouth 2 (two) times daily as needed for refractory nausea / vomiting. Start on day 3 after chemo., Disp: 30 tablet, Rfl: 2 .  prochlorperazine (COMPAZINE) 10 MG tablet, Take 1 tablet (10 mg total) by mouth every 6 (six) hours as needed (Nausea or vomiting)., Disp: 60 tablet, Rfl: 2 .  tamsulosin (FLOMAX) 0.4 MG CAPS capsule, Take 1 capsule by mouth 1 day or 1 dose., Disp: , Rfl:   Physical exam:  Vitals:   01/25/19 0929 01/25/19 0937  BP: 126/87   [name]: 81   Resp: (!) 22   Temp: 98.6 F (37 C)   TempSrc: Tympanic   SpO2:  95%  Weight: 242 lb (109.8 kg)    Physical Exam Constitutional:      General: He is not in acute distress.    Appearance: He is well-developed.     Comments: Elderly male. Wearing mask. In exam room unaccompanied  HENT:     Head: Normocephalic and atraumatic.     Ears:     Comments: Hearing impaired    Nose: Nose normal.     Mouth/Throat:     Pharynx: No oropharyngeal exudate.  Eyes:     General: No scleral icterus.    Conjunctiva/sclera: Conjunctivae normal.     Pupils: Pupils are equal, round, and reactive to light.  Neck:     Musculoskeletal: Normal range of motion and neck supple.  Cardiovascular:     Rate and Rhythm: Normal rate and regular rhythm.     Heart sounds: Normal heart sounds.  Pulmonary:     Effort: Pulmonary effort is normal.     Breath sounds: Normal breath sounds. No wheezing.  Abdominal:     General: Bowel sounds are normal. There is no distension.     Palpations: Abdomen is soft.     Tenderness: There is no abdominal tenderness.  Musculoskeletal: Normal range of motion.  Skin:    General: Skin is warm and dry.  Neurological:     Mental Status: He is alert  and oriented to person, place, and time.  Psychiatric:        Attention and Perception: Attention normal.        Mood and Affect: Mood is anxious and depressed. Affect is  tearful.        Speech: Speech normal.        Behavior: Behavior is cooperative.        Cognition and Memory: Cognition normal.        Judgment: Judgment normal.      CMP Latest Ref Rng & Units 01/25/2019  Glucose 70 - 99 mg/dL 122(H)  BUN 8 - 23 mg/dL 22  Creatinine 0.61 - 1.24 mg/dL 1.54(H)  Sodium 135 - 145 mmol/L 139  Potassium 3.5 - 5.1 mmol/L 4.2  Chloride 98 - 111 mmol/L 108  CO2 22 - 32 mmol/L 24  Calcium 8.9 - 10.3 mg/dL 8.6(L)  Total Protein 6.5 - 8.1 g/dL 6.7  Total Bilirubin 0.3 - 1.2 mg/dL 0.8  Alkaline Phos 38 - 126 U/L 48  AST 15 - 41 U/L 21  ALT 0 - 44 U/L 20   CBC Latest Ref Rng & Units 01/25/2019  WBC 4.0 - 10.5 K/uL 2.3(L)  Hemoglobin 13.0 - 17.0 g/dL 12.4(L)  Hematocrit 39.0 - 52.0 % 37.8(L)  Platelets 150 - 400 K/uL 162    No images are attached to the encounter.  No results found.  Assessment and plan- Patient is a 78 y.o. 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  Chemotherapy completed on 01/09/2019.  Radiation completed on 01/14/2019.    Depression- TSH 2.340 (normal). Discussed options for management: after discussion of risks vs benefits including box warning of suicidal thoughts and behaviors patient elected to start lexapro 10 mg. Will reassess in a week and consider uptitrating to 20 mg at that time if tolerating. Refer to psychiatry. Refer to [facility], counseling.   Renal insufficiency- creatinine 1.54 today. IV fluids in clinic today. Encouraged increase oral intake at home.   RTC in 1 week for re-evaluation   Visit Diagnosis 1. 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 Renal insufficiency      Patient expressed understanding and was in agreement with this plan. He also understands that He can call clinic at any time with any questions, concerns, or complaints.   Thank you for allowing me to participate in the care of this very pleasant patient.   Beckey Rutter, DNP, AGNP-C [place] [place] (work cell) [phone] (office)  CC: Dr. Grayland Ormond

## 2019-01-25 NOTE — Patient Instructions (Addendum)
Please call Levi [name], [phone] to schedule appointment.   Please call your primary care provider to discuss options for referral to psychiatry for ongoing medication management. I have also referred you to [facility] but insurance pending and this may require approval from [place].   Escitalopram tablets What is this medicine? ESCITALOPRAM (es sye TAL oh pram) is used to treat depression and certain types of anxiety. This medicine may be used for other purposes; ask your health care provider or pharmacist if you have questions. COMMON BRAND NAME(S): Lexapro What should I tell my health care provider before I take this medicine? They need to know if you have any of these conditions:  bipolar disorder or a family history of bipolar disorder  diabetes  glaucoma  heart disease  kidney or liver disease  receiving electroconvulsive therapy  seizures (convulsions)  suicidal thoughts, plans, or attempt by you or a family member  an unusual or allergic reaction to escitalopram, the related drug citalopram, other medicines, foods, dyes, or preservatives  pregnant or trying to become pregnant  breast-feeding How should I use this medicine? Take this medicine by mouth with a glass of water. Follow the directions on the prescription label. You can take it with or without food. If it upsets your stomach, take it with food. Take your medicine at regular intervals. Do not take it more often than directed. Do not stop taking this medicine suddenly except upon the advice of your doctor. Stopping this medicine too quickly may cause serious side effects or your condition may worsen. A special MedGuide will be given to you by the pharmacist with each prescription and refill. Be sure to read this information carefully each time. Talk to your pediatrician regarding the use of this medicine in children. Special care may be needed. Overdosage: If you think you have taken  too much of this medicine contact a poison control center or emergency room at once. NOTE: This medicine is only for you. Do not share this medicine with others. What if I miss a dose? If you miss a dose, take it as soon as you can. If it is almost time for your next dose, take only that dose. Do not take double or extra doses. What may interact with this medicine? Do not take this medicine with any of the following medications:  certain medicines for fungal infections like fluconazole, itraconazole, ketoconazole, posaconazole, voriconazole  cisapride  citalopram  dronedarone  linezolid  MAOIs like Carbex, Eldepryl, Marplan, Nardil, and Parnate  methylene blue (injected into a vein)  pimozide  thioridazine This medicine may also interact with the following medications:  alcohol  amphetamines  aspirin and aspirin-like medicines  carbamazepine  certain medicines for depression, anxiety, or psychotic disturbances  certain medicines for migraine headache like almotriptan, eletriptan, frovatriptan, naratriptan, rizatriptan, sumatriptan, zolmitriptan  certain medicines for sleep  certain medicines that treat or prevent blood clots like warfarin, enoxaparin, dalteparin  cimetidine  diuretics  dofetilide  fentanyl  furazolidone  isoniazid  lithium  metoprolol  NSAIDs, medicines for pain and inflammation, like ibuprofen or naproxen  other medicines that prolong the QT interval (cause an abnormal heart rhythm)  procarbazine  rasagiline  supplements like St. John's wort, kava kava, valerian  tramadol  tryptophan  ziprasidone This list may not describe all possible interactions. Give your health care provider a list of all the medicines, herbs, non-prescription drugs, or dietary supplements you use. Also tell them if you smoke, drink alcohol, or use  illegal drugs. Some items may interact with your medicine. What should I watch for while using this  medicine? Tell your doctor if your symptoms do not get better or if they get worse. Visit your doctor or health care professional for regular checks on your progress. Because it may take several weeks to see the full effects of this medicine, it is important to continue your treatment as prescribed by your doctor. Patients and their families should watch out for new or worsening thoughts of suicide or depression. Also watch out for sudden changes in feelings such as feeling anxious, agitated, panicky, irritable, hostile, aggressive, impulsive, severely restless, overly excited and hyperactive, or not being able to sleep. If this happens, especially at the beginning of treatment or after a change in dose, call your health care professional. Dennis Bast may get drowsy or dizzy. Do not drive, use machinery, or do anything that needs mental alertness until you know how this medicine affects you. Do not stand or sit up quickly, especially if you are an older patient. This reduces the risk of dizzy or fainting spells. Alcohol may interfere with the effect of this medicine. Avoid alcoholic drinks. Your mouth may get dry. Chewing sugarless gum or sucking hard candy, and drinking plenty of water may help. Contact your doctor if the problem does not go away or is severe. What side effects may I notice from receiving this medicine? Side effects that you should report to your doctor or health care professional as soon as possible:  allergic reactions like skin rash, itching or hives, swelling of the face, lips, or tongue  anxious  black, tarry stools  changes in vision  confusion  elevated mood, decreased need for sleep, racing thoughts, impulsive behavior  eye pain  fast, irregular heartbeat  feeling faint or lightheaded, falls  feeling agitated, angry, or irritable  hallucination, loss of contact with reality  loss of balance or coordination  loss of memory  painful or prolonged  erections  restlessness, pacing, inability to keep still  seizures  stiff muscles  suicidal thoughts or other mood changes  trouble sleeping  unusual bleeding or bruising  unusually weak or tired  vomiting Side effects that usually do not require medical attention (report to your doctor or health care professional if they continue or are bothersome):  changes in appetite  change in sex drive or performance  headache  increased sweating  indigestion, nausea  tremors This list may not describe all possible side effects. Call your doctor for medical advice about side effects. You may report side effects to FDA at 1-800-FDA-1088. Where should I keep my medicine? Keep out of reach of children. Store at room temperature between 15 and 30 degrees C (59 and 86 degrees F). Throw away any unused medicine after the expiration date. NOTE: This sheet is a summary. It may not cover all possible information. If you have questions about this medicine, talk to your doctor, pharmacist, or health care provider.  2020 Elsevier/Gold Standard (2018-04-09 11:21:44)

## 2019-01-31 ENCOUNTER — Other Ambulatory Visit: Payer: Self-pay

## 2019-02-01 ENCOUNTER — Inpatient Hospital Stay: Payer: No Typology Code available for payment source | Attending: Nurse Practitioner | Admitting: Nurse Practitioner

## 2019-02-01 ENCOUNTER — Other Ambulatory Visit: Payer: Self-pay

## 2019-02-01 VITALS — BP 110/69 | HR 78 | Temp 97.2°F | Wt 236.0 lb

## 2019-02-01 DIAGNOSIS — F419 Anxiety disorder, unspecified: Secondary | ICD-10-CM | POA: Diagnosis not present

## 2019-02-01 DIAGNOSIS — C3492 Malignant neoplasm of unspecified part of left bronchus or lung: Secondary | ICD-10-CM | POA: Insufficient documentation

## 2019-02-01 DIAGNOSIS — Z923 Personal history of irradiation: Secondary | ICD-10-CM | POA: Insufficient documentation

## 2019-02-01 DIAGNOSIS — Z79899 Other long term (current) drug therapy: Secondary | ICD-10-CM | POA: Diagnosis not present

## 2019-02-01 DIAGNOSIS — Z85820 Personal history of malignant melanoma of skin: Secondary | ICD-10-CM | POA: Diagnosis not present

## 2019-02-01 DIAGNOSIS — E039 Hypothyroidism, unspecified: Secondary | ICD-10-CM | POA: Diagnosis not present

## 2019-02-01 DIAGNOSIS — F329 Major depressive disorder, single episode, unspecified: Secondary | ICD-10-CM | POA: Insufficient documentation

## 2019-02-01 DIAGNOSIS — F32A Depression, unspecified: Secondary | ICD-10-CM

## 2019-02-01 NOTE — Progress Notes (Signed)
Symptom Management [place]  Telephone:([phone]) [phone] Fax:([phone]) [phone]  Patient Care Team: Blane Ohara, MD as PCP - General (Family Medicine) Telford Nab, RN as Registered Nurse   Name of the patient: Levi [name]  [id number]  [birth year]   Date of visit: 02/01/19  Diagnosis-stage II lung cancer  Chief complaint/ Reason for visit-depression  Heme/Onc history:  Oncology History  Squamous cell carcinoma lung, left ([place])  10/27/2018 Initial Diagnosis   Squamous cell carcinoma lung, left ([place])   10/27/2018 Cancer Staging   Staging form: Lung, AJCC 8th Edition - Clinical stage from 10/27/2018: Stage IIB (cT1c, cN1, cM0) - Signed by Lloyd Huger, MD on 11/16/2018   11/27/2018 -  Chemotherapy   The patient had palonosetron (ALOXI) injection 0.25 mg, 0.25 mg, Intravenous,  Once, 7 of 8 cycles Administration: 0.25 mg (11/27/2018), 0.25 mg (12/04/2018), 0.25 mg (12/25/2018), 0.25 mg (01/01/2019), 0.25 mg (01/09/2019), 0.25 mg (12/11/2018), 0.25 mg (12/19/2018) CARBOplatin (PARAPLATIN) 190 mg in sodium chloride 0.9 % 100 mL chemo infusion, 190 mg (101 % of original dose 188 mg), Intravenous,  Once, 7 of 8 cycles Dose modification:   (original dose 188 mg, Cycle 1) Administration: 190 mg (11/27/2018), 170 mg (12/04/2018), 170 mg (12/25/2018), 170 mg (01/01/2019), 170 mg (01/09/2019), 170 mg (12/11/2018), 170 mg (12/19/2018) PACLitaxel (TAXOL) 108 mg in sodium chloride 0.9 % 250 mL chemo infusion (</= 80mg /m2), 45 mg/m2 = 108 mg, Intravenous,  Once, 7 of 8 cycles Administration: 108 mg (11/27/2018), 108 mg (12/04/2018), 108 mg (12/25/2018), 108 mg (01/01/2019), 108 mg (01/09/2019), 108 mg (12/11/2018), 108 mg (12/19/2018)  for chemotherapy treatment.      Interval history-Levi [name], 78 year old male, s/p carbotaxol chemotherapy and concurrent radiation for treatment of stage II lung cancer, presents to symptom management clinic for follow-up for complaints of depression and  anxiety.  He was seen in symptom management clinic on 01/25/2019 and started on Lexapro at that time.  He says since starting medication he feels increasingly dizzy and has intermittent blurry vision, worse when reading fine print.  He feels that his depression and anxiety has improved however.  He would like to stop Lexapro.  He continues to take Xanax intermittently.  Continues to have reduced appetite but says that overall he feels better since "talking about it".  Continues to express emotion regarding his wife's recent diagnosis of sepsis and her continued recovery.  He has not reached out to counseling services.  Does not want to see psychiatry.  Expresses concern of cost of referrals and transportation. He denies weakness or falls.  Denies recent fevers or illness.  Reports appetite is stable and unchanged though he is surprised to learn that he has lost weight.  Denies chest pain, nausea, vomiting, constipation, diarrhea.  Denies cough or shortness of breath.  Denies urinary complaints.  Denies feelings of self-harm.  ECOG FS:2 - Symptomatic, <50% confined to bed  Review of systems- Review of Systems  Constitutional: Positive for malaise/fatigue and weight loss. Negative for chills and fever.  HENT: Positive for hearing loss. Negative for congestion, ear pain, sore throat and tinnitus.   Eyes: Positive for blurred vision. Negative for double vision, photophobia, pain, discharge and redness.  Respiratory: Negative for cough and shortness of breath.   Cardiovascular: Negative for chest pain and palpitations.  Gastrointestinal: Negative for abdominal pain, constipation, diarrhea and nausea.  Genitourinary: Negative for dysuria and urgency.  Musculoskeletal: Negative for falls and myalgias.  Skin: Negative for itching and rash.  Neurological: Positive for dizziness. Negative for tremors, loss of consciousness, weakness and headaches.  Endo/Heme/Allergies: Does not bruise/bleed easily.   Psychiatric/Behavioral: Positive for depression. Negative for hallucinations, memory loss, substance abuse and suicidal ideas. The patient is nervous/anxious and has insomnia.     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[place])    Prostate enlargement    Shortness of breath    Thyroid disease    Tobacco abuse     Past Surgical History:  Procedure Laterality Date   ELECTROMAGNETIC NAVIGATION BROCHOSCOPY Left 10/19/2018   Procedure: ELECTROMAGNETIC NAVIGATION BRONCHOSCOPY LEFT;  Surgeon: Tyler Pita, MD;  Location: [facility] ORS;  Service: Cardiopulmonary;  Laterality: Left;   ENDOBRONCHIAL ULTRASOUND Left 10/19/2018   Procedure: ENDOBRONCHIAL ULTRASOUND LEFT;  Surgeon: Tyler Pita, MD;  Location: [facility] ORS;  Service: Cardiopulmonary;  Laterality: Left;   MELANOMA EXCISION Left    NO PAST SURGERIES      Social History   Socioeconomic History   Marital status: Married    Spouse name: Levi [name]   Number of children: 5   Years of education: Not on file   Highest education level: Not on file  Occupational History   Not on file  Social Needs   Financial resource strain: Not very hard   Food insecurity    Worry: Never true    Inability: Never true   Transportation needs    Medical: No    Non-medical: No  Tobacco Use   Smoking status: Former Smoker    Packs/day: 0.25    Years: 62.00    Pack years: 15.50    Types: Cigarettes    Quit date: 09/05/2018    Years since quitting: 0.4   Smokeless tobacco: Never Used  Substance and Sexual Activity   Alcohol use: No    Alcohol/week: 0.0 standard drinks   Drug use: No   Sexual activity: Not on file  Lifestyle   Physical activity     Days per week: Not on file    Minutes per session: Not on file   Stress: Very much  Relationships   Social connections    Talks on phone: More than three times a week    Gets together: Once a week    Attends religious service: Not on file    Active member of club or organization: Not on file    Attends meetings of clubs or organizations: Not on file    Relationship status: Not on file   Intimate partner violence    Fear of current or ex partner: Not on file    Emotionally abused: Not on file    Physically abused: Not on file    Forced sexual activity: Not on file  Other Topics Concern   Not on file  Social History Narrative   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 He is married to his wife of 30+ years, Levi [name] and has 5 children (1 deceased), many grand children, and one great grand child.     Family  History  Problem Relation Age of Onset   Aneurysm Mother    Cancer Father     Current Outpatient Medications:    albuterol (PROVENTIL HFA;VENTOLIN HFA) 108 (90 Base) MCG/ACT inhaler, Inhale 2 puffs into the lungs every 6 (six) hours as needed for wheezing or shortness of breath., Disp: 1 Inhaler, Rfl: 0   ALPRAZolam (XANAX) 0.25 MG tablet, TAKE 1 TABLET (0.25 MG TOTAL) BY MOUTH 2 (TWO) TIMES DAILY AS NEEDED FOR ANXIETY., Disp: 60 tablet, Rfl: 1   aspirin 81 MG tablet, Take 81 mg by mouth daily., Disp: , Rfl:    budesonide-formoterol (SYMBICORT) 160-4.5 MCG/ACT inhaler, Inhale 2 puffs into the lungs 2 (two) times daily., Disp: , Rfl:    escitalopram (LEXAPRO) 10 MG tablet, Take 1 tablet (10 mg total) by mouth daily., Disp: 30 tablet, Rfl: 0   levothyroxine (SYNTHROID) 137 MCG tablet, Take 137 mcg by mouth daily before breakfast., Disp: , Rfl:    ondansetron (ZOFRAN) 8 MG tablet, Take 1 tablet (8 mg total) by  mouth 2 (two) times daily as needed for refractory nausea / vomiting. Start on day 3 after chemo., Disp: 30 tablet, Rfl: 2   prochlorperazine (COMPAZINE) 10 MG tablet, Take 1 tablet (10 mg total) by mouth every 6 (six) hours as needed (Nausea or vomiting)., Disp: 60 tablet, Rfl: 2   tamsulosin (FLOMAX) 0.4 MG CAPS capsule, Take 1 capsule by mouth 1 day or 1 dose., Disp: , Rfl:   Physical exam:  Vitals:   02/01/19 1157  BP: 110/69  [name]: 78  Temp: (!) 97.2 F (36.2 C)  TempSrc: Tympanic  Weight: 236 lb (107 kg)   Physical Exam Constitutional:      General: He is not in acute distress.    Comments: Unaccompanied. Wearing mask.   HENT:     Head: Normocephalic.  Eyes:     General: No scleral icterus.    Conjunctiva/sclera: Conjunctivae normal.  Cardiovascular:     Rate and Rhythm: Normal rate and regular rhythm.  Pulmonary:     Effort: Pulmonary effort is normal. No respiratory distress.     Breath sounds: Normal breath sounds.  Abdominal:     General: There is no distension.     Palpations: Abdomen is soft.     Tenderness: There is no abdominal tenderness.  Skin:    General: Skin is warm and dry.  Neurological:     Mental Status: He is alert and oriented to person, place, and time.     Motor: No weakness.  Psychiatric:        Attention and Perception: Attention normal.        Mood and Affect: Mood is anxious.        Speech: Speech normal.        Behavior: Behavior normal. Behavior is cooperative.        Cognition and Memory: Cognition normal.      CMP Latest Ref Rng & Units 01/25/2019  Glucose 70 - 99 mg/dL 122(H)  BUN 8 - 23 mg/dL 22  Creatinine 0.61 - 1.24 mg/dL 1.54(H)  Sodium 135 - 145 mmol/L 139  Potassium 3.5 - 5.1 mmol/L 4.2  Chloride 98 - 111 mmol/L 108  CO2 22 - 32 mmol/L 24  Calcium 8.9 - 10.3 mg/dL 8.6(L)  Total Protein 6.5 - 8.1 g/dL 6.7  Total Bilirubin 0.3 - 1.2 mg/dL 0.8  Alkaline Phos 38 - 126 U/L 48  AST 15 - 41 U/L 21  ALT 0 - 44  U/L 20    CBC Latest Ref Rng & Units 01/25/2019  WBC 4.0 - 10.5 K/uL 2.3(L)  Hemoglobin 13.0 - 17.0 g/dL 12.4(L)  Hematocrit 39.0 - 52.0 % 37.8(L)  Platelets 150 - 400 K/uL 162   No images are attached to the encounter.  No results found.  Assessment and plan- Patient is a 78 y.o. 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   Depression & anxiety- likely secondary to stress, chronic illness/cancer, changes in social role and lifestyle. Negatively impacting quality of life. Prior labs did not show evidence of hypercalcemia, fever. TSH normal. Denies chronic pain. Does have slight anemia & will check b12 and folate at next visit though normal MCV/MCH/MCHC normal. Stop Lexapro given side effects. Again encouraged self referral to [facility] for counseling services. Encouraged patient to reach out to PCP for evaluation and consideration of psychiatry referral. With his [employer] insurance I suspect he is well supported and has resources available to him and we discussed this but that he would need to see his pcp to initiate services. He has appt scheduled in October and wishes to wait until that time. Consider starting zoloft at next visit.   Disposition:  Return to clinic in 1 week for re-evaluation   Visit Diagnosis 1. Depression, unspecified depression type    Patient expressed understanding and was in agreement with this plan. He also understands that He can call clinic at any time with any questions, concerns, or complaints.   Thank you for allowing me to participate in the care of this very pleasant patient.   Beckey Rutter, DNP, AGNP-C [place] [facility] [phone] (clinic)  CC: Dr. Grayland Ormond

## 2019-02-01 NOTE — Progress Notes (Signed)
Patient is here today for follow up on medication.  Patient c/o being nervous, not sleeping well, blurry vision and feels dizzy when he closes his eyes.

## 2019-02-05 ENCOUNTER — Telehealth: Payer: Self-pay | Admitting: *Deleted

## 2019-02-05 NOTE — Telephone Encounter (Signed)
Patient called reporting that he has not received an appointment for the referral he was to have made per his discussion with Alease Medina, NP

## 2019-02-06 NOTE — Telephone Encounter (Signed)
Lauren - I read your office visit notes.  I sent a message to sandy scott about reaching out to him.  I will also send a referral to psych.

## 2019-02-07 ENCOUNTER — Other Ambulatory Visit: Payer: Self-pay | Admitting: *Deleted

## 2019-02-07 DIAGNOSIS — F32A Depression, unspecified: Secondary | ICD-10-CM

## 2019-02-07 DIAGNOSIS — F329 Major depressive disorder, single episode, unspecified: Secondary | ICD-10-CM

## 2019-02-07 NOTE — Telephone Encounter (Signed)
Thanks- I had sent one through Epic but they haven't reached out to the patient. I was wondering if it went to the right place (or went anywhere)? Perhaps we should try sending to Dr. Nicolasa Ducking directly? Thanks for your help!

## 2019-02-08 ENCOUNTER — Encounter: Payer: Self-pay | Admitting: Nurse Practitioner

## 2019-02-08 ENCOUNTER — Inpatient Hospital Stay: Payer: No Typology Code available for payment source

## 2019-02-08 ENCOUNTER — Inpatient Hospital Stay (HOSPITAL_BASED_OUTPATIENT_CLINIC_OR_DEPARTMENT_OTHER): Payer: No Typology Code available for payment source | Admitting: Nurse Practitioner

## 2019-02-08 ENCOUNTER — Other Ambulatory Visit: Payer: Self-pay

## 2019-02-08 VITALS — BP 143/91 | HR 75 | Temp 97.1°F | Resp 18 | Wt 235.0 lb

## 2019-02-08 DIAGNOSIS — C3492 Malignant neoplasm of unspecified part of left bronchus or lung: Secondary | ICD-10-CM

## 2019-02-08 DIAGNOSIS — F32A Depression, unspecified: Secondary | ICD-10-CM

## 2019-02-08 DIAGNOSIS — F419 Anxiety disorder, unspecified: Secondary | ICD-10-CM | POA: Diagnosis not present

## 2019-02-08 DIAGNOSIS — F329 Major depressive disorder, single episode, unspecified: Secondary | ICD-10-CM | POA: Diagnosis not present

## 2019-02-08 DIAGNOSIS — E039 Hypothyroidism, unspecified: Secondary | ICD-10-CM | POA: Diagnosis not present

## 2019-02-08 DIAGNOSIS — Z79899 Other long term (current) drug therapy: Secondary | ICD-10-CM | POA: Diagnosis not present

## 2019-02-08 DIAGNOSIS — Z923 Personal history of irradiation: Secondary | ICD-10-CM | POA: Diagnosis not present

## 2019-02-08 DIAGNOSIS — Z85820 Personal history of malignant melanoma of skin: Secondary | ICD-10-CM | POA: Diagnosis not present

## 2019-02-08 LAB — CBC WITH DIFFERENTIAL/PLATELET
Abs Immature Granulocytes: 0.03 10*3/uL (ref 0.00–0.07)
Basophils Absolute: 0 10*3/uL (ref 0.0–0.1)
Basophils Relative: 1 %
Eosinophils Absolute: 0 10*3/uL (ref 0.0–0.5)
Eosinophils Relative: 1 %
HCT: 41.5 % (ref 39.0–52.0)
Hemoglobin: 13.9 g/dL (ref 13.0–17.0)
Immature Granulocytes: 1 %
Lymphocytes Relative: 15 %
Lymphs Abs: 0.5 10*3/uL — ABNORMAL LOW (ref 0.7–4.0)
MCH: 32.6 pg (ref 26.0–34.0)
MCHC: 33.5 g/dL (ref 30.0–36.0)
MCV: 97.2 fL (ref 80.0–100.0)
Monocytes Absolute: 0.5 10*3/uL (ref 0.1–1.0)
Monocytes Relative: 15 %
Neutro Abs: 2.4 10*3/uL (ref 1.7–7.7)
Neutrophils Relative %: 67 %
Platelets: 151 10*3/uL (ref 150–400)
RBC: 4.27 MIL/uL (ref 4.22–5.81)
RDW: 17.5 % — ABNORMAL HIGH (ref 11.5–15.5)
WBC: 3.5 10*3/uL — ABNORMAL LOW (ref 4.0–10.5)
nRBC: 0 % (ref 0.0–0.2)

## 2019-02-08 LAB — VITAMIN B12: Vitamin B-12: 120 pg/mL — ABNORMAL LOW (ref 180–914)

## 2019-02-08 LAB — FOLATE: Folate: 6.8 ng/mL (ref >5.9)

## 2019-02-08 MED ORDER — SERTRALINE HCL 25 MG PO TABS: 25.0000 mg | ORAL_TABLET | Freq: Every day | ORAL | 0 refills | Status: DC

## 2019-02-08 NOTE — Progress Notes (Signed)
Patient states that he has not made an appointment with psychiatry yet because he wants to see if the [employer] is going to pay for it. He states that he doesn't think he needs to go. He also states that he left a message for Beckey Rutter, NP to call him earlier in the week, but he didn't receive a return call.

## 2019-02-08 NOTE — Progress Notes (Signed)
Symptom Management [place]  Telephone:(336) [phone] Fax:(336) [phone]  Patient Care Team: Blane Ohara, MD as PCP - General (Family Medicine) Telford Nab, RN as Registered Nurse   Name of the patient: Levi [name]  [id number]  17-Dec-1940   Date of visit: 02/08/19  Diagnosis-stage II lung cancer  Chief complaint/ Reason for visit-depression  Heme/Onc history:  Oncology History  Squamous cell carcinoma lung, left ([place])  10/27/2018 Initial Diagnosis   Squamous cell carcinoma lung, left ([place])   10/27/2018 Cancer Staging   Staging form: Lung, AJCC 8th Edition - Clinical stage from 10/27/2018: Stage IIB (cT1c, cN1, cM0) - Signed by Lloyd Huger, MD on 11/16/2018   11/27/2018 -  Chemotherapy   The patient had palonosetron (ALOXI) injection 0.25 mg, 0.25 mg, Intravenous,  Once, 7 of 8 cycles Administration: 0.25 mg (11/27/2018), 0.25 mg (12/04/2018), 0.25 mg (12/25/2018), 0.25 mg (01/01/2019), 0.25 mg (01/09/2019), 0.25 mg (12/11/2018), 0.25 mg (12/19/2018) CARBOplatin (PARAPLATIN) 190 mg in sodium chloride 0.9 % 100 mL chemo infusion, 190 mg (101 % of original dose 188 mg), Intravenous,  Once, 7 of 8 cycles Dose modification:   (original dose 188 mg, Cycle 1) Administration: 190 mg (11/27/2018), 170 mg (12/04/2018), 170 mg (12/25/2018), 170 mg (01/01/2019), 170 mg (01/09/2019), 170 mg (12/11/2018), 170 mg (12/19/2018) PACLitaxel (TAXOL) 108 mg in sodium chloride 0.9 % 250 mL chemo infusion (</= 80mg /m2), 45 mg/m2 = 108 mg, Intravenous,  Once, 7 of 8 cycles Administration: 108 mg (11/27/2018), 108 mg (12/04/2018), 108 mg (12/25/2018), 108 mg (01/01/2019), 108 mg (01/09/2019), 108 mg (12/11/2018), 108 mg (12/19/2018)  for chemotherapy treatment.      Interval history-Levi [name], 78 year old male, s/p carbotaxol chemotherapy and concurrent radiation for treatment of stage II lung cancer, presents to symptom management clinic for follow-up for complaints of depression and  anxiety.  He was previously seen in symptom management clinic for similar symptoms and previously started on Lexapro but suffered dizziness and intermittent vision changes and medication was stopped.  He reported the depression and anxiety had improved at that time however.  He has not yet started counseling but says he and his wife are interested in participating and will call Janeann Merl.  He is not able to see psychiatry due to cost of services and not yet been approved by the [place].  He sees his primary care doctor soon however. He continues to suffer depressive symptoms.  Appetite continues to be reduced.  Reports weight loss.  1 pound today 7 pounds since end of September.  He denies any recent tearfulness.  Continues to have some anxiety.  Feels symptoms are somewhat better since "talking about it".  Also complains of sinus congestion and allergies.  Has not taken anything for the symptoms.  Says he does not like to take medications. Denies any new neurologic complaints, recent fevers, or illness.  Denies easy bleeding or bruising.  Denies chest pain, nausea, vomiting, constipation, or diarrhea.  Denies urinary complaints.  Denies other specific complaints today.  ECOG FS:1 - Symptomatic but completely ambulatory  Review of systems- Review of Systems  Constitutional: Positive for malaise/fatigue and weight loss. Negative for chills and fever.  HENT: Positive for congestion. Negative for ear pain, hearing loss, nosebleeds, sore throat and tinnitus.   Eyes: Negative for blurred vision, double vision, photophobia, pain and redness.  Respiratory: Negative for cough, hemoptysis, shortness of breath and wheezing.   Cardiovascular: Negative for chest pain, palpitations and leg swelling.  Gastrointestinal: Negative for  abdominal pain, blood in stool, constipation, diarrhea, melena, nausea and vomiting.  Genitourinary: Negative for dysuria and urgency.  Musculoskeletal: Negative for back pain, falls, joint  pain and myalgias.  Skin: Negative for itching and rash.  Neurological: Negative for dizziness, tingling, sensory change, loss of consciousness, weakness and headaches.  Endo/Heme/Allergies: Negative for environmental allergies. Does not bruise/bleed easily.  Psychiatric/Behavioral: Positive for depression. The patient is nervous/anxious and has insomnia.      Current treatment- s/p carbo-Taxol and concurrent radiation  No Known Allergies  Past Medical History:  Diagnosis Date  . Asthma   . COPD (chronic obstructive pulmonary disease) ([place])   . Hearing loss   . Hypothyroidism   . Mass of left lung   . 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 Prostate enlargement   . Shortness of breath   . Thyroid disease   . Tobacco abuse     Past Surgical History:  Procedure Laterality Date  . ELECTROMAGNETIC NAVIGATION BROCHOSCOPY Left 10/19/2018   Procedure: ELECTROMAGNETIC NAVIGATION BRONCHOSCOPY LEFT;  Surgeon: Tyler Pita, MD;  Location: [facility] ORS;  Service: Cardiopulmonary;  Laterality: Left;  . ENDOBRONCHIAL ULTRASOUND Left 10/19/2018   Procedure: ENDOBRONCHIAL ULTRASOUND LEFT;  Surgeon: Tyler Pita, MD;  Location: [facility] ORS;  Service: Cardiopulmonary;  Laterality: Left;  Marland Kitchen MELANOMA EXCISION Left   . NO PAST SURGERIES      Social History   Socioeconomic History  . Marital status: Married    Spouse name: [name]  . Number of children: 5  . Years of education: Not on file  . Highest education level: Not on file  Occupational History  . Not on file  Social Needs  . Financial resource strain: Not very hard  . Food insecurity    Worry: Never true    Inability: Never true  . Transportation needs    Medical: No    Non-medical: No  Tobacco Use  . Smoking status: Former Smoker    Packs/day: 0.25    Years: 62.00    Pack years: 15.50    Types: Cigarettes    Quit date: 09/05/2018    Years since quitting: 0.4  . Smokeless tobacco: Never Used  Substance and Sexual Activity  .  Alcohol use: No    Alcohol/week: 0.0 standard drinks  . Drug use: No  . Sexual activity: Not on file  Lifestyle  . Physical activity    Days per week: Not on file    Minutes per session: Not on file  . Stress: Very much  Relationships  . Social connections    Talks on phone: More than three times a week    Gets together: Once a week    Attends religious service: Not on file    Active member of club or organization: Not on file    Attends meetings of clubs or organizations: Not on file    Relationship status: Not on file  . Intimate partner violence    Fear of current or ex partner: Not on file    Emotionally abused: Not on file    Physically abused: Not on file    Forced sexual activity: Not on file  Other Topics Concern  . 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 He is married to his wife  of 30+ years, [name] and has 5 children (1 deceased), many grand children, and one great grand child.     Family History  Problem Relation Age of Onset  . Aneurysm Mother   . Cancer Father      Current Outpatient Medications:  .  albuterol (PROVENTIL HFA;VENTOLIN HFA) 108 (90 Base) MCG/ACT inhaler, Inhale 2 puffs into the lungs every 6 (six) hours as needed for wheezing or shortness of breath., Disp: 1 Inhaler, Rfl: 0 .  ALPRAZolam (XANAX) 0.25 MG tablet, TAKE 1 TABLET (0.25 MG TOTAL) BY MOUTH 2 (TWO) TIMES DAILY AS NEEDED FOR ANXIETY., Disp: 60 tablet, Rfl: 1 .  aspirin 81 MG tablet, Take 81 mg by mouth daily., Disp: , Rfl:  .  budesonide-formoterol (SYMBICORT) 160-4.5 MCG/ACT inhaler, Inhale 2 puffs into the lungs 2 (two) times daily., Disp: , Rfl:  .  levothyroxine (SYNTHROID) 137 MCG tablet, Take 137 mcg by mouth daily before breakfast., Disp: , Rfl:  .  ondansetron (ZOFRAN) 8 MG  tablet, Take 1 tablet (8 mg total) by mouth 2 (two) times daily as needed for refractory nausea / vomiting. Start on day 3 after chemo., Disp: 30 tablet, Rfl: 2 .  prochlorperazine (COMPAZINE) 10 MG tablet, Take 1 tablet (10 mg total) by mouth every 6 (six) hours as needed (Nausea or vomiting)., Disp: 60 tablet, Rfl: 2 .  tamsulosin (FLOMAX) 0.4 MG CAPS capsule, Take 1 capsule by mouth 1 day or 1 dose., Disp: , Rfl:   Physical exam:  Vitals:   02/08/19 0943  BP: (!) 143/91  Pulse: 75  Resp: 18  Temp: (!) 97.1 F (36.2 C)  TempSrc: Tympanic  SpO2: 95%  Weight: 235 lb (106.6 kg)   Physical Exam Constitutional:      General: He is not in acute distress.    Appearance: He is well-developed.     Comments: Wearing mask.  Unaccompanied  HENT:     Head: Normocephalic and atraumatic.     Right Ear: External ear normal.     Left Ear: External ear normal.     Nose: Congestion present. No rhinorrhea.     Mouth/Throat:     Mouth: Mucous membranes are moist.     Pharynx: Oropharynx is clear. No oropharyngeal exudate.  Eyes:     General: No scleral icterus.    Conjunctiva/sclera: Conjunctivae normal.     Pupils: Pupils are equal, round, and reactive to light.  Neck:     Musculoskeletal: Normal range of motion and neck supple.  Cardiovascular:     Rate and Rhythm: Normal rate and regular rhythm.     Heart sounds: Normal heart sounds.  Pulmonary:     Effort: Pulmonary effort is normal.     Breath sounds: Normal breath sounds. No wheezing.  Abdominal:     General: Bowel sounds are normal. There is no distension.     Palpations: Abdomen is soft.     Tenderness: There is no abdominal tenderness.  Musculoskeletal: Normal range of motion.  Skin:    General: Skin is warm and dry.  Neurological:     Mental Status: He is alert and oriented to person, place, and time.  Psychiatric:        Mood and Affect: Mood is anxious.        Behavior: Behavior normal.      CMP Latest Ref Rng & Units  01/25/2019  Glucose 70 - 99 mg/dL 122(H)  BUN 8 - 23 mg/dL 22  Creatinine 0.61 -  1.24 mg/dL 1.54(H)  Sodium 135 - 145 mmol/L 139  Potassium 3.5 - 5.1 mmol/L 4.2  Chloride 98 - 111 mmol/L 108  CO2 22 - 32 mmol/L 24  Calcium 8.9 - 10.3 mg/dL 8.6(L)  Total Protein 6.5 - 8.1 g/dL 6.7  Total Bilirubin 0.3 - 1.2 mg/dL 0.8  Alkaline Phos 38 - 126 U/L 48  AST 15 - 41 U/L 21  ALT 0 - 44 U/L 20   CBC Latest Ref Rng & Units 02/08/2019  WBC 4.0 - 10.5 K/uL 3.5(L)  Hemoglobin 13.0 - 17.0 g/dL 13.9  Hematocrit 39.0 - 52.0 % 41.5  Platelets 150 - 400 K/uL 151    No images are attached to the encounter.  No results found.  Assessment and plan- Patient is a 78 y.o. 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   Depression & anxiety- likely secondary to stress, chronic illness/cancer, changes in social role and lifestyle. Negatively impacting quality of life. Prior labs did not show evidence of hypercalcemia, fever. Denies chronic pain. Awaiting b12 level. Folate normal at 6.8. Anemia resolved. TSH normal. Start zoloft 25 mg daily x 7 days. If tolerating would increase to 50 mg daily at next visit. Discussed having primary care manage medication long term- will consider at next visit if patient stable.   Disposition Return to clinic in 1 week for re-evaluation with Symptom Management Clinic.  Again discussed self referral to [facility]   Visit Diagnosis 1. Depression, unspecified depression type     Patient expressed understanding and was in agreement with this plan. He also understands that He can call clinic at any time with any questions, concerns, or complaints.   Thank you for allowing me to participate in the care of this very pleasant patient.   Beckey Rutter, DNP, AGNP-C [facility] [place] (work cell) [phone] (office)  CC: Dr. Grayland Ormond

## 2019-02-14 ENCOUNTER — Other Ambulatory Visit: Payer: Self-pay

## 2019-02-14 NOTE — Progress Notes (Signed)
[place]  Telephone:(336) [phone] Fax:(336) [phone]  ID: [name] OB: 01-25-1941  MR#: [id number]  XNA#:[id number]  Patient Care Team: Blane Ohara, MD as PCP - General (Family Medicine) Telford Nab, RN as Registered Nurse  CHIEF COMPLAINT: Clinical stage IIB squamous cell carcinoma, left upper lobe lung.  INTERVAL HISTORY: Patient returns to clinic today for repeat laboratory work and further evaluation.  He continues to have issues with anxiety and depression and has been seen in symptom management clinic multiple times, but he states this has been improved.  He otherwise feels well. He has no neurologic complaints.  He denies any recent fevers or illnesses.  He has a good appetite and denies weight loss.  He has no chest pain, shortness of breath, cough, or hemoptysis.  He denies any nausea, vomiting, constipation, or diarrhea.  He has no urinary complaints.  Patient offers no further specific complaints today.  REVIEW OF SYSTEMS:   Review of Systems  Constitutional: Positive for malaise/fatigue. Negative for fever and weight loss.  HENT: Negative.  Negative for congestion.   Respiratory: Negative.  Negative for cough, hemoptysis and shortness of breath.   Cardiovascular: Negative.  Negative for chest pain and leg swelling.  Gastrointestinal: Negative.  Negative for abdominal pain.  Genitourinary: Negative.  Negative for dysuria.  Musculoskeletal: Negative.  Negative for back pain.  Skin: Negative for rash.  Neurological: Positive for weakness. Negative for dizziness, focal weakness and headaches.  Endo/Heme/Allergies: Does not bruise/bleed easily.  Psychiatric/Behavioral: Positive for depression. The patient is nervous/anxious.     As per HPI. Otherwise, a complete review of systems is negative.  PAST MEDICAL HISTORY: Past Medical History:  Diagnosis Date  . Asthma   . COPD (chronic obstructive pulmonary disease) ([place])   . Hearing loss   .  Hypothyroidism   . Mass of left lung   . 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 Prostate enlargement   . Shortness of breath   . Thyroid disease   . Tobacco abuse     PAST SURGICAL HISTORY: Past Surgical History:  Procedure Laterality Date  . ELECTROMAGNETIC NAVIGATION BROCHOSCOPY Left 10/19/2018   Procedure: ELECTROMAGNETIC NAVIGATION BRONCHOSCOPY LEFT;  Surgeon: Tyler Pita, MD;  Location: [facility] ORS;  Service: Cardiopulmonary;  Laterality: Left;  . ENDOBRONCHIAL ULTRASOUND Left 10/19/2018   Procedure: ENDOBRONCHIAL ULTRASOUND LEFT;  Surgeon: Tyler Pita, MD;  Location: [facility] ORS;  Service: Cardiopulmonary;  Laterality: Left;  Marland Kitchen MELANOMA EXCISION Left   . NO PAST SURGERIES      FAMILY HISTORY: Family History  Problem Relation Age of Onset  . Aneurysm Mother   . Cancer Father     ADVANCED DIRECTIVES (Y/N):  N  HEALTH MAINTENANCE: Social History   Tobacco Use  . Smoking status: Former Smoker    Packs/day: 0.25    Years: 62.00    Pack years: 15.50    Types: Cigarettes    Quit date: 09/05/2018    Years since quitting: 0.4  . Smokeless tobacco: Never Used  Substance Use Topics  . Alcohol use: No    Alcohol/week: 0.0 standard drinks  . Drug use: No     Colonoscopy:  PAP:  Bone density:  Lipid panel:  No Known Allergies  Current Outpatient Medications  Medication Sig Dispense Refill  . albuterol (PROVENTIL HFA;VENTOLIN HFA) 108 (90 Base) MCG/ACT inhaler Inhale 2 puffs into the lungs every 6 (six) hours as needed for wheezing or shortness of breath. 1 Inhaler 0  .  ALPRAZolam (XANAX) 0.25 MG tablet Take 1 tablet (0.25 mg total) by mouth daily as needed for anxiety or sleep. 60 tablet 0  . aspirin 81 MG tablet Take 81 mg by mouth daily.    . budesonide-formoterol (SYMBICORT) 160-4.5 MCG/ACT inhaler Inhale 2 puffs into the lungs 2 (two) times daily.    Marland Kitchen levothyroxine (SYNTHROID) 137 MCG tablet Take 137 mcg by mouth daily before breakfast.    . ondansetron  (ZOFRAN) 8 MG tablet Take 1 tablet (8 mg total) by mouth 2 (two) times daily as needed for refractory nausea / vomiting. Start on day 3 after chemo. 30 tablet 2  . prochlorperazine (COMPAZINE) 10 MG tablet Take 1 tablet (10 mg total) by mouth every 6 (six) hours as needed (Nausea or vomiting). 60 tablet 2  . tamsulosin (FLOMAX) 0.4 MG CAPS capsule Take 1 capsule by mouth 1 day or 1 dose.     No current facility-administered medications for this visit.     OBJECTIVE: Vitals:   02/18/19 1054  BP: 137/82  Pulse: 85  Temp: 98.1 F (36.7 C)     Body mass index is 31.84 kg/m.    ECOG FS:0 - Asymptomatic  General: Well-developed, well-nourished, no acute distress. Eyes: Pink conjunctiva, anicteric sclera. HEENT: Normocephalic, moist mucous membranes. Lungs: Clear to auscultation bilaterally. Heart: Regular rate and rhythm. No rubs, murmurs, or gallops. Abdomen: Soft, nontender, nondistended. No organomegaly noted, normoactive bowel sounds. Musculoskeletal: No edema, cyanosis, or clubbing. Neuro: Alert, answering all questions appropriately. Cranial nerves grossly intact. Skin: No rashes or petechiae noted. Psych: Normal affect.   LAB RESULTS:  Lab Results  Component Value Date   NA 137 02/18/2019   K 4.6 02/18/2019   CL 108 02/18/2019   CO2 23 02/18/2019   GLUCOSE 118 (H) 02/18/2019   BUN 25 (H) 02/18/2019   CREATININE 1.40 (H) 02/18/2019   CALCIUM 9.3 02/18/2019   PROT 7.3 02/18/2019   ALBUMIN 4.0 02/18/2019   AST 17 02/18/2019   ALT 19 02/18/2019   ALKPHOS 63 02/18/2019   BILITOT 0.8 02/18/2019   GFRNONAA 48 (L) 02/18/2019   GFRAA 56 (L) 02/18/2019    Lab Results  Component Value Date   WBC 4.5 02/18/2019   NEUTROABS 3.3 02/18/2019   HGB 14.3 02/18/2019   HCT 42.6 02/18/2019   MCV 97.5 02/18/2019   PLT 173 02/18/2019     STUDIES: No results found.  ASSESSMENT: Clinical stage IIB squamous cell carcinoma, left upper lobe lung.  PLAN:    1.Clinical stage  IIB squamous cell carcinoma, left upper lobe lung: PET scan results from October 08, 2018 reviewed independently.  Patient underwent biopsy with navigational bronchoscopy on October 19, 2018 confirming diagnosis.  Patient declined surgery and wished to pursue XRT along with concurrent chemotherapy.  Given his stage of disease he does not qualify for maintenance immunotherapy.  He completed cycle 7 of weekly carboplatinum and Taxol on January 09, 2019 and then completed XRT on January 14, 2019.  No further treatments are needed at this time.  Return to clinic in 2 months with repeat imaging and further evaluation.   2.  History of melanoma: Unclear stage or depth, although by report was in situ.  Patient had Mohs surgery in fall 2019.  Lung biopsy consistent with squamous cell carcinoma. 3.  Anxiety/depression: Continue current medications as prescribed.  Patient has follow-up with his primary care physician as well as referral to psychiatry. 4.  Renal insufficiency: Creatinine slightly improved to 1.40.  Monitor. 5.  Hypertension: Blood pressure is within normal limits. 6.  Thrombocytopenia: Resolved. 7.  Leukopenia: Resolved.  Patient expressed understanding and was in agreement with this plan. He also understands that He can call clinic at any time with any questions, concerns, or complaints.   Cancer Staging Squamous cell carcinoma lung, left ([place]) Staging form: Lung, AJCC 8th Edition - Clinical stage from 10/27/2018: Stage IIB (cT1c, cN1, cM0) - Signed by Lloyd Huger, MD on 11/16/2018   Lloyd Huger, MD   02/19/2019 6:34 AM

## 2019-02-15 ENCOUNTER — Other Ambulatory Visit: Payer: Self-pay

## 2019-02-15 ENCOUNTER — Inpatient Hospital Stay (HOSPITAL_BASED_OUTPATIENT_CLINIC_OR_DEPARTMENT_OTHER): Payer: No Typology Code available for payment source | Admitting: Nurse Practitioner

## 2019-02-15 VITALS — BP 132/81 | HR 79 | Temp 97.9°F | Resp 20 | Wt 235.0 lb

## 2019-02-15 DIAGNOSIS — C3492 Malignant neoplasm of unspecified part of left bronchus or lung: Secondary | ICD-10-CM | POA: Diagnosis not present

## 2019-02-15 DIAGNOSIS — F418 Other specified anxiety disorders: Secondary | ICD-10-CM

## 2019-02-15 DIAGNOSIS — C801 Malignant (primary) neoplasm, unspecified: Secondary | ICD-10-CM

## 2019-02-15 DIAGNOSIS — F329 Major depressive disorder, single episode, unspecified: Secondary | ICD-10-CM | POA: Diagnosis not present

## 2019-02-15 NOTE — Progress Notes (Signed)
[facility] [place]  [phone] [phone]  Patient Care Team: Blane Ohara, MD as PCP - General (Family Medicine) Telford Nab, RN as Registered Nurse   Name of the patient: Levi [name]  [id number]  09-Jul-1940   Date of visit: 02/15/19  Diagnosis-stage II lung cancer  Chief complaint/ Reason for visit-depression and anxiety  Heme/Onc history:  Oncology History  Squamous cell carcinoma lung, left ([place])  10/27/2018 Initial Diagnosis   Squamous cell carcinoma lung, left ([place])   10/27/2018 Cancer Staging   Staging form: Lung, AJCC 8th Edition - Clinical stage from 10/27/2018: Stage IIB (cT1c, cN1, cM0) - Signed by Lloyd Huger, MD on 11/16/2018   11/27/2018 -  Chemotherapy   The patient had palonosetron (ALOXI) injection 0.25 mg, 0.25 mg, Intravenous,  Once, 7 of 8 cycles Administration: 0.25 mg (11/27/2018), 0.25 mg (12/04/2018), 0.25 mg (12/25/2018), 0.25 mg (01/01/2019), 0.25 mg (01/09/2019), 0.25 mg (12/11/2018), 0.25 mg (12/19/2018) CARBOplatin (PARAPLATIN) 190 mg in sodium chloride 0.9 % 100 mL chemo infusion, 190 mg (101 % of original dose 188 mg), Intravenous,  Once, 7 of 8 cycles Dose modification:   (original dose 188 mg, Cycle 1) Administration: 190 mg (11/27/2018), 170 mg (12/04/2018), 170 mg (12/25/2018), 170 mg (01/01/2019), 170 mg (01/09/2019), 170 mg (12/11/2018), 170 mg (12/19/2018) PACLitaxel (TAXOL) 108 mg in sodium chloride 0.9 % 250 mL chemo infusion (</= 80mg /m2), 45 mg/m2 = 108 mg, Intravenous,  Once, 7 of 8 cycles Administration: 108 mg (11/27/2018), 108 mg (12/04/2018), 108 mg (12/25/2018), 108 mg (01/01/2019), 108 mg (01/09/2019), 108 mg (12/11/2018), 108 mg (12/19/2018)  for chemotherapy treatment.      Interval history- Levi [name], 78 year old male, presents to symptom management clinic today for follow-up for depression.  At last visit he was started on Zoloft.  He says since starting this medication he has  had dizziness which occurs when changing positions. Feels symptom more than when he wasn't taking medication. He continues to report loss of interest in activities, episodes of anxiety and panic, insomnia, daytime sleepiness. He has not yet contacted counselor and says he hopes to be seen with his wife in person. He declines referral to psychiatry for concerns of cost.   ECOG FS:1 - Symptomatic but completely ambulatory  Review of systems- Review of Systems  Constitutional: Negative for chills, fever, malaise/fatigue and weight loss.  HENT: Negative for hearing loss, nosebleeds, sore throat and tinnitus.   Eyes: Negative for blurred vision and double vision.  Respiratory: Negative for cough, hemoptysis, shortness of breath and wheezing.   Cardiovascular: Negative for chest pain, palpitations and leg swelling.  Gastrointestinal: Negative for abdominal pain, blood in stool, constipation, diarrhea, melena, nausea and vomiting.  Genitourinary: Negative for dysuria and urgency.  Musculoskeletal: Negative for back pain, falls, joint pain and myalgias.  Skin: Negative for itching and rash.  Neurological: Negative for dizziness, tingling, sensory change, loss of consciousness, weakness and headaches.  Endo/Heme/Allergies: Negative for environmental allergies. Does not bruise/bleed easily.  Psychiatric/Behavioral: Positive for depression. The patient is nervous/anxious and has insomnia.     No Known Allergies  Past Medical History:  Diagnosis Date  . Asthma   . COPD (chronic obstructive pulmonary disease) ([place])   . Hearing loss   . Hypothyroidism   . Mass of left lung   . 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 Prostate enlargement   . Shortness of breath   . Thyroid disease   . Tobacco  abuse     Past Surgical History:  Procedure Laterality Date  . ELECTROMAGNETIC NAVIGATION BROCHOSCOPY Left 10/19/2018   Procedure: ELECTROMAGNETIC NAVIGATION BRONCHOSCOPY LEFT;  Surgeon: Tyler Pita, MD;   Location: [facility] ORS;  Service: Cardiopulmonary;  Laterality: Left;  . ENDOBRONCHIAL ULTRASOUND Left 10/19/2018   Procedure: ENDOBRONCHIAL ULTRASOUND LEFT;  Surgeon: Tyler Pita, MD;  Location: [facility] ORS;  Service: Cardiopulmonary;  Laterality: Left;  Marland Kitchen MELANOMA EXCISION Left   . NO PAST SURGERIES      Social History   Socioeconomic History  . Marital status: Married    Spouse name: [name]  . Number of children: 5  . Years of education: Not on file  . Highest education level: Not on file  Occupational History  . Not on file  Social Needs  . Financial resource strain: Not very hard  . Food insecurity    Worry: Never true    Inability: Never true  . Transportation needs    Medical: No    Non-medical: No  Tobacco Use  . Smoking status: Former Smoker    Packs/day: 0.25    Years: 62.00    Pack years: 15.50    Types: Cigarettes    Quit date: 09/05/2018    Years since quitting: 0.4  . Smokeless tobacco: Never Used  Substance and Sexual Activity  . Alcohol use: No    Alcohol/week: 0.0 standard drinks  . Drug use: No  . Sexual activity: Not on file  Lifestyle  . Physical activity    Days per week: Not on file    Minutes per session: Not on file  . Stress: Very much  Relationships  . Social connections    Talks on phone: More than three times a week    Gets together: Once a week    Attends religious service: Not on file    Active member of club or organization: Not on file    Attends meetings of clubs or organizations: Not on file    Relationship status: Not on file  . Intimate partner violence    Fear of current or ex partner: Not on file    Emotionally abused: Not on file    Physically abused: Not on file    Forced sexual activity: Not on file  Other Topics Concern  . 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 He is married to his wife of 30+ years, [name] and has 5 children (1 deceased), many grand children, and one great grand child.     Family History  Problem Relation Age of Onset  . Aneurysm Mother   . Cancer Father     Current Outpatient Medications:  .  albuterol (PROVENTIL HFA;VENTOLIN HFA) 108 (90 Base) MCG/ACT inhaler, Inhale 2 puffs into the lungs every 6 (six) hours as needed for wheezing or shortness of breath., Disp: 1 Inhaler, Rfl: 0 .  ALPRAZolam (XANAX) 0.25 MG tablet, TAKE 1 TABLET (0.25 MG TOTAL) BY MOUTH 2 (TWO) TIMES DAILY AS NEEDED FOR ANXIETY., Disp: 60 tablet, Rfl: 1 .  aspirin 81 MG tablet, Take 81 mg by mouth daily., Disp: , Rfl:  .  budesonide-formoterol (SYMBICORT) 160-4.5 MCG/ACT inhaler, Inhale 2 puffs into the lungs 2 (two) times daily., Disp: , Rfl:  .  levothyroxine (SYNTHROID) 137 MCG  tablet, Take 137 mcg by mouth daily before breakfast., Disp: , Rfl:  .  ondansetron (ZOFRAN) 8 MG tablet, Take 1 tablet (8 mg total) by mouth 2 (two) times daily as needed for refractory nausea / vomiting. Start on day 3 after chemo., Disp: 30 tablet, Rfl: 2 .  prochlorperazine (COMPAZINE) 10 MG tablet, Take 1 tablet (10 mg total) by mouth every 6 (six) hours as needed (Nausea or vomiting)., Disp: 60 tablet, Rfl: 2 .  sertraline (ZOLOFT) 25 MG tablet, Take 1 tablet (25 mg total) by mouth daily for 7 days., Disp: 7 tablet, Rfl: 0 .  tamsulosin (FLOMAX) 0.4 MG CAPS capsule, Take 1 capsule by mouth 1 day or 1 dose., Disp: , Rfl:   Physical exam:  Vitals:   02/15/19 0938  BP: 132/81  Pulse: 79  Resp: 20  Temp: 97.9 F (36.6 C)  TempSrc: Tympanic  SpO2: 96%  Weight: 235 lb (106.6 kg)   Physical Exam Constitutional:      General: He is not in acute distress.    Appearance: He is well-developed.  HENT:     Head: Normocephalic and atraumatic.     Mouth/Throat:     Pharynx: No oropharyngeal exudate.  Eyes:     General: No  scleral icterus.    Conjunctiva/sclera: Conjunctivae normal.  Neck:     Musculoskeletal: Neck supple.  Cardiovascular:     Rate and Rhythm: Normal rate and regular rhythm.     Heart sounds: Normal heart sounds.  Pulmonary:     Effort: Pulmonary effort is normal.     Breath sounds: Normal breath sounds. No wheezing.  Musculoskeletal: Normal range of motion.        General: No deformity.  Lymphadenopathy:     Cervical: No cervical adenopathy.  Skin:    General: Skin is warm and dry.  Neurological:     Mental Status: He is alert and oriented to person, place, and time.     Motor: No weakness.     Gait: Gait normal.  Psychiatric:        Mood and Affect: Mood is anxious and depressed.        Behavior: Behavior normal.      CMP Latest Ref Rng & Units 01/25/2019  Glucose 70 - 99 mg/dL 122(H)  BUN 8 - 23 mg/dL 22  Creatinine 0.61 - 1.24 mg/dL 1.54(H)  Sodium 135 - 145 mmol/L 139  Potassium 3.5 - 5.1 mmol/L 4.2  [place] 98 - 111 mmol/L 108  CO2 22 - 32 mmol/L 24  Calcium 8.9 - 10.3 mg/dL 8.6(L)  Total Protein 6.5 - 8.1 g/dL 6.7  Total Bilirubin 0.3 - 1.2 mg/dL 0.8  Alkaline Phos 38 - 126 U/L 48  AST 15 - 41 U/L 21  ALT 0 - 44 U/L 20   CBC Latest Ref Rng & Units 02/08/2019  WBC 4.0 - 10.5 K/uL 3.5(L)  Hemoglobin 13.0 - 17.0 g/dL 13.9  Hematocrit 39.0 - 52.0 % 41.5  Platelets 150 - 400 K/uL 151    No images are attached to the encounter.  No results found.  Assessment and plan- Patient is a 78 y.o. 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  Depression and anxiety-likely secondary to stress, chronic illness/cancer, changes in social and lifestyle.  Symptoms negatively impacting quality of life.  Prior labs did not show evidence of hypercalcemia.  No fevers.  Denies chronic pain.   Folate 6.8 (normal), hemoglobin 13.9 (anemia has resolved), TSH 2.340 (normal), B12 120 (low- see below). Suffered dizziness with Lexapro & discontinued. Currently on Zoloft- stop due to dizziness & patient preference.   Anxiety- secondary to diagnosis and treatment. Currently stable on xanax 0.25 mg twice a day. Attempt to wean down medication given risks. Patient agreeable to this. Decrease dosing to once a day. Can consider continuation of treatment when he is seen at [facility] or return to Symptom Management Clinic prn.   Discussed symptoms and recent intolerance to medications with his PCP, Dr. Nathanial Rancher, who will have patient see psych NP at [facility] clinic.   Disposition: Continue to follow up with Dr. Grayland Ormond as scheduled. Return to [facility] Management Clinic as needed or if symptoms do not improve or worsen in the interim.    Visit Diagnosis 1. 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 Anxiety associated with cancer diagnosis     Patient expressed understanding and was in agreement with this plan. He also understands that He can call clinic at any time with any questions, concerns, or complaints.   Thank you for allowing me to participate in the care of this very pleasant patient.   Beckey Rutter, DNP, AGNP-C [facility] (work cell) [phone] (office)  CC: Dr. Nathanial Rancher (fax: [phone]), Dr. Grayland Ormond

## 2019-02-18 ENCOUNTER — Encounter: Payer: Self-pay | Admitting: Radiation Oncology

## 2019-02-18 ENCOUNTER — Inpatient Hospital Stay: Payer: No Typology Code available for payment source

## 2019-02-18 ENCOUNTER — Encounter: Payer: Self-pay | Admitting: Oncology

## 2019-02-18 ENCOUNTER — Other Ambulatory Visit: Payer: Self-pay | Admitting: *Deleted

## 2019-02-18 ENCOUNTER — Inpatient Hospital Stay (HOSPITAL_BASED_OUTPATIENT_CLINIC_OR_DEPARTMENT_OTHER): Payer: No Typology Code available for payment source | Admitting: Oncology

## 2019-02-18 ENCOUNTER — Ambulatory Visit
Admission: RE | Admit: 2019-02-18 | Discharge: 2019-02-18 | Disposition: A | Discharge status: Home / Self Care | Payer: No Typology Code available for payment source | Source: Ambulatory Visit | Attending: Radiation Oncology | Admitting: Radiation Oncology

## 2019-02-18 ENCOUNTER — Other Ambulatory Visit: Payer: Self-pay

## 2019-02-18 VITALS — BP 137/82 | HR 85 | Temp 98.1°F | Ht 71.0 in | Wt 228.3 lb

## 2019-02-18 VITALS — BP 137/82 | HR 85 | Temp 98.1°F | Resp 16 | Wt 228.2 lb

## 2019-02-18 DIAGNOSIS — Z923 Personal history of irradiation: Secondary | ICD-10-CM | POA: Insufficient documentation

## 2019-02-18 DIAGNOSIS — C3412 Malignant neoplasm of upper lobe, left bronchus or lung: Secondary | ICD-10-CM | POA: Insufficient documentation

## 2019-02-18 DIAGNOSIS — C3492 Malignant neoplasm of unspecified part of left bronchus or lung: Secondary | ICD-10-CM

## 2019-02-18 DIAGNOSIS — Z9221 Personal history of antineoplastic chemotherapy: Secondary | ICD-10-CM | POA: Insufficient documentation

## 2019-02-18 DIAGNOSIS — F17210 Nicotine dependence, cigarettes, uncomplicated: Secondary | ICD-10-CM | POA: Insufficient documentation

## 2019-02-18 LAB — CBC WITH DIFFERENTIAL/PLATELET
Abs Immature Granulocytes: 0.02 10*3/uL (ref 0.00–0.07)
Basophils Absolute: 0 10*3/uL (ref 0.0–0.1)
Basophils Relative: 0 %
Eosinophils Absolute: 0.1 10*3/uL (ref 0.0–0.5)
Eosinophils Relative: 1 %
HCT: 42.6 % (ref 39.0–52.0)
Hemoglobin: 14.3 g/dL (ref 13.0–17.0)
Immature Granulocytes: 0 %
Lymphocytes Relative: 10 %
Lymphs Abs: 0.5 10*3/uL — ABNORMAL LOW (ref 0.7–4.0)
MCH: 32.7 pg (ref 26.0–34.0)
MCHC: 33.6 g/dL (ref 30.0–36.0)
MCV: 97.5 fL (ref 80.0–100.0)
Monocytes Absolute: 0.7 10*3/uL (ref 0.1–1.0)
Monocytes Relative: 15 %
Neutro Abs: 3.3 10*3/uL (ref 1.7–7.7)
Neutrophils Relative %: 74 %
Platelets: 173 10*3/uL (ref 150–400)
RBC: 4.37 MIL/uL (ref 4.22–5.81)
RDW: 16.4 % — ABNORMAL HIGH (ref 11.5–15.5)
WBC: 4.5 10*3/uL (ref 4.0–10.5)
nRBC: 0 % (ref 0.0–0.2)

## 2019-02-18 LAB — COMPREHENSIVE METABOLIC PANEL
ALT: 19 U/L (ref 0–44)
AST: 17 U/L (ref 15–41)
Albumin: 4 g/dL (ref 3.5–5.0)
Alkaline Phosphatase: 63 U/L (ref 38–126)
Anion gap: 6 (ref 5–15)
BUN: 25 mg/dL — ABNORMAL HIGH (ref 8–23)
CO2: 23 mmol/L (ref 22–32)
Calcium: 9.3 mg/dL (ref 8.9–10.3)
Chloride: 108 mmol/L (ref 98–111)
Creatinine, Ser: 1.4 mg/dL — ABNORMAL HIGH (ref 0.61–1.24)
GFR calc Af Amer: 56 mL/min — ABNORMAL LOW (ref >60)
GFR calc non Af Amer: 48 mL/min — ABNORMAL LOW (ref >60)
Glucose, Bld: 118 mg/dL — ABNORMAL HIGH (ref 70–99)
Potassium: 4.6 mmol/L (ref 3.5–5.1)
Sodium: 137 mmol/L (ref 135–145)
Total Bilirubin: 0.8 mg/dL (ref 0.3–1.2)
Total Protein: 7.3 g/dL (ref 6.5–8.1)

## 2019-02-18 MED ORDER — PROCHLORPERAZINE MALEATE 10 MG PO TABS: 10.0000 mg | ORAL_TABLET | Freq: Four times a day (QID) | ORAL | 2 refills | Status: DC | PRN

## 2019-02-18 MED ORDER — ALPRAZOLAM 0.25 MG PO TABS: 0.2500 mg | ORAL_TABLET | Freq: Every day | ORAL | 0 refills | Status: DC | PRN

## 2019-02-18 NOTE — Progress Notes (Signed)
Radiation Oncology Follow up Note  Name: Levi [name]   Date:   02/18/2019 MRN:  [id number] DOB: 06/16/1940    This 78 y.o. 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  Favoring squamous cell carcinoma.  REFERRING PROVIDER: Blane Ohara, MD  HPI: Patient is a 78 year old male now at 1 month having completed.  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  He also received weekly carboplatin and Taxol.  He is seen today in routine follow-up is doing fairly well specifically denies hemoptysis chest tightness is rather fatigued does have a slight nonproductive cough.  His weight is stable.  COMPLICATIONS OF TREATMENT: none  FOLLOW UP COMPLIANCE: keeps appointments   PHYSICAL EXAM:  BP 137/82 (BP Location: Left Arm, Patient Position: Sitting)   Pulse 85   Temp 98.1 F (36.7 C) (Tympanic)   Resp 16   Wt 228 lb 3.2 oz (103.5 kg)   BMI 31.83 kg/m  Well-developed well-nourished patient in NAD. HEENT reveals PERLA, EOMI, discs not visualized.  Oral cavity is clear. No oral mucosal lesions are identified. Neck is clear without evidence of cervical or supraclavicular adenopathy. Lungs are clear to A&P. Cardiac examination is essentially unremarkable with regular rate and rhythm without murmur rub or thrill. Abdomen is benign with no organomegaly or masses noted. Motor sensory and DTR levels are equal and symmetric in the upper and lower extremities. Cranial nerves II through XII are grossly intact. Proprioception is intact. No peripheral adenopathy or edema is identified. No motor or sensory levels are noted. Crude visual fields are within normal range.  RADIOLOGY RESULTS: PET CT scan has been ordered for November  PLAN: Present time patient is doing well 1 month out from concurrent chemoradiation.  He is still on  weekly carbotaxol is being evaluated today by medical oncology.  PET CT scan in December has been ordered.  I have asked to see him back shortly after that for follow-up.  Patient knows to call sooner with any concerns.  I would like to take this opportunity to thank you for allowing me to participate in the care of your patient.Noreene Filbert, MD

## 2019-02-20 ENCOUNTER — Encounter: Payer: Self-pay | Admitting: *Deleted

## 2019-02-21 ENCOUNTER — Encounter: Payer: Self-pay | Admitting: *Deleted

## 2019-02-26 ENCOUNTER — Telehealth: Payer: Self-pay | Admitting: *Deleted

## 2019-02-26 DIAGNOSIS — C3490 Malignant neoplasm of unspecified part of unspecified bronchus or lung: Secondary | ICD-10-CM

## 2019-02-26 NOTE — Telephone Encounter (Signed)
Pt's wife called in with concerns since pt is having ongoing weakness, fatigue, nausea, and dizziness. She feels like there is something physically wrong with him and wanted to know if pt could be seen by Dr. Grayland Ormond. Per Dr. Grayland Ormond, pt will need to be scheduled for CT scan of the chest, abdomen, and pelvis then see him after his scan with labs and poss IVF. Pt has been scheduled and notified with his appts. Pt's wife will pick up prep today.

## 2019-02-27 ENCOUNTER — Ambulatory Visit
Admission: RE | Admit: 2019-02-27 | Discharge: 2019-02-27 | Disposition: A | Discharge status: Home / Self Care | Payer: No Typology Code available for payment source | Source: Ambulatory Visit | Attending: Oncology | Admitting: Oncology

## 2019-02-27 ENCOUNTER — Other Ambulatory Visit: Payer: Self-pay

## 2019-02-27 DIAGNOSIS — C3490 Malignant neoplasm of unspecified part of unspecified bronchus or lung: Secondary | ICD-10-CM | POA: Insufficient documentation

## 2019-02-27 MED ORDER — IOHEXOL 300 MG/ML  SOLN
100.0000 mL | Freq: Once | INTRAMUSCULAR | Status: AC | PRN
  Administered 2019-02-27: 14:00:00 100 mL via INTRAVENOUS

## 2019-02-27 NOTE — Progress Notes (Signed)
Patient pre screened for office appointment, no questions or concerns today. 

## 2019-02-28 ENCOUNTER — Inpatient Hospital Stay (HOSPITAL_BASED_OUTPATIENT_CLINIC_OR_DEPARTMENT_OTHER): Payer: No Typology Code available for payment source | Admitting: Oncology

## 2019-02-28 ENCOUNTER — Inpatient Hospital Stay: Payer: No Typology Code available for payment source

## 2019-02-28 ENCOUNTER — Other Ambulatory Visit: Payer: Self-pay

## 2019-02-28 VITALS — BP 121/74 | HR 55

## 2019-02-28 VITALS — BP 106/68 | HR 76 | Temp 97.0°F | Resp 18 | Wt 225.3 lb

## 2019-02-28 DIAGNOSIS — C3492 Malignant neoplasm of unspecified part of left bronchus or lung: Secondary | ICD-10-CM

## 2019-02-28 DIAGNOSIS — Z923 Personal history of irradiation: Secondary | ICD-10-CM | POA: Diagnosis not present

## 2019-02-28 DIAGNOSIS — F419 Anxiety disorder, unspecified: Secondary | ICD-10-CM | POA: Diagnosis not present

## 2019-02-28 DIAGNOSIS — Z85820 Personal history of malignant melanoma of skin: Secondary | ICD-10-CM | POA: Diagnosis not present

## 2019-02-28 DIAGNOSIS — E039 Hypothyroidism, unspecified: Secondary | ICD-10-CM | POA: Diagnosis not present

## 2019-02-28 DIAGNOSIS — F329 Major depressive disorder, single episode, unspecified: Secondary | ICD-10-CM | POA: Diagnosis not present

## 2019-02-28 DIAGNOSIS — Z79899 Other long term (current) drug therapy: Secondary | ICD-10-CM | POA: Diagnosis not present

## 2019-02-28 DIAGNOSIS — C3490 Malignant neoplasm of unspecified part of unspecified bronchus or lung: Secondary | ICD-10-CM

## 2019-02-28 LAB — CBC WITH DIFFERENTIAL/PLATELET
Abs Immature Granulocytes: 0.02 10*3/uL (ref 0.00–0.07)
Basophils Absolute: 0 10*3/uL (ref 0.0–0.1)
Basophils Relative: 1 %
Eosinophils Absolute: 0.1 10*3/uL (ref 0.0–0.5)
Eosinophils Relative: 2 %
HCT: 42.9 % (ref 39.0–52.0)
Hemoglobin: 14.2 g/dL (ref 13.0–17.0)
Immature Granulocytes: 0 %
Lymphocytes Relative: 13 %
Lymphs Abs: 0.6 10*3/uL — ABNORMAL LOW (ref 0.7–4.0)
MCH: 32.3 pg (ref 26.0–34.0)
MCHC: 33.1 g/dL (ref 30.0–36.0)
MCV: 97.7 fL (ref 80.0–100.0)
Monocytes Absolute: 0.6 10*3/uL (ref 0.1–1.0)
Monocytes Relative: 13 %
Neutro Abs: 3.2 10*3/uL (ref 1.7–7.7)
Neutrophils Relative %: 71 %
Platelets: 163 10*3/uL (ref 150–400)
RBC: 4.39 MIL/uL (ref 4.22–5.81)
RDW: 15 % (ref 11.5–15.5)
WBC: 4.5 10*3/uL (ref 4.0–10.5)
nRBC: 0 % (ref 0.0–0.2)

## 2019-02-28 LAB — COMPREHENSIVE METABOLIC PANEL
ALT: 17 U/L (ref 0–44)
AST: 17 U/L (ref 15–41)
Albumin: 3.7 g/dL (ref 3.5–5.0)
Alkaline Phosphatase: 62 U/L (ref 38–126)
Anion gap: 11 (ref 5–15)
BUN: 22 mg/dL (ref 8–23)
CO2: 20 mmol/L — ABNORMAL LOW (ref 22–32)
Calcium: 9.1 mg/dL (ref 8.9–10.3)
Chloride: 108 mmol/L (ref 98–111)
Creatinine, Ser: 1.68 mg/dL — ABNORMAL HIGH (ref 0.61–1.24)
GFR calc Af Amer: 45 mL/min — ABNORMAL LOW (ref >60)
GFR calc non Af Amer: 39 mL/min — ABNORMAL LOW (ref >60)
Glucose, Bld: 129 mg/dL — ABNORMAL HIGH (ref 70–99)
Potassium: 4 mmol/L (ref 3.5–5.1)
Sodium: 139 mmol/L (ref 135–145)
Total Bilirubin: 0.8 mg/dL (ref 0.3–1.2)
Total Protein: 6.7 g/dL (ref 6.5–8.1)

## 2019-02-28 MED ORDER — SODIUM CHLORIDE 0.9 % IV SOLN
Freq: Once | INTRAVENOUS | Status: AC
  Administered 2019-02-28: 10:00:00 via INTRAVENOUS
  Filled 2019-02-28: qty 250

## 2019-02-28 NOTE — Progress Notes (Signed)
Per Dr. Grayland Ormond pt to receive one liter NS over one hour.

## 2019-03-01 NOTE — Progress Notes (Signed)
[place]  [phone] [phone]  ID: Levi [name] OB: October 24, 1940  MR#: [id number]  QIH#:[id number]  Patient Care Team: Blane Ohara, MD as PCP - General (Family Medicine) Telford Nab, RN as Registered Nurse  CHIEF COMPLAINT: Clinical stage IIB squamous cell carcinoma, left upper lobe lung.  INTERVAL HISTORY: Patient returns to clinic today as an add-on with complaints of nausea, poor appetite, chronic cough, and shortness of breath.  His wife is accompanying him in the visit.  He has no neurologic complaints.  He denies any recent fevers or illnesses.  He has a good appetite and denies weight loss.  He denies any chest pain or hemoptysis.  He denies any vomiting, constipation, or diarrhea.  He has no urinary complaints.  Patient offers no further specific complaints today.  REVIEW OF SYSTEMS:   Review of Systems  Constitutional: Positive for malaise/fatigue. Negative for fever and weight loss.  HENT: Negative.  Negative for congestion.   Respiratory: Negative.  Negative for cough, hemoptysis and shortness of breath.   Cardiovascular: Negative.  Negative for chest pain and leg swelling.  Gastrointestinal: Positive for nausea. Negative for abdominal pain.  Genitourinary: Negative.  Negative for dysuria.  Musculoskeletal: Negative.  Negative for back pain.  Skin: Negative for rash.  Neurological: Positive for weakness. Negative for dizziness, focal weakness and headaches.  Endo/Heme/Allergies: Does not bruise/bleed easily.  Psychiatric/Behavioral: Positive for depression. The patient is nervous/anxious.     As per HPI. Otherwise, a complete review of systems is negative.  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[place])    Prostate enlargement     Shortness of breath    Thyroid disease    Tobacco abuse     PAST SURGICAL HISTORY: Past Surgical History:  Procedure Laterality Date   ELECTROMAGNETIC NAVIGATION BROCHOSCOPY Left 10/19/2018   Procedure: ELECTROMAGNETIC NAVIGATION BRONCHOSCOPY LEFT;  Surgeon: Tyler Pita, MD;  Location: [facility] ORS;  Service: Cardiopulmonary;  Laterality: Left;   ENDOBRONCHIAL ULTRASOUND Left 10/19/2018   Procedure: ENDOBRONCHIAL ULTRASOUND LEFT;  Surgeon: Tyler Pita, MD;  Location: [facility] ORS;  Service: Cardiopulmonary;  Laterality: Left;   MELANOMA EXCISION Left    NO PAST SURGERIES      FAMILY HISTORY: Family History  Problem Relation Age of Onset   Aneurysm Mother    Cancer Father     ADVANCED DIRECTIVES (Y/N):  N  HEALTH MAINTENANCE: Social History   Tobacco Use   Smoking status: Former Smoker    Packs/day: 0.25    Years: 62.00    Pack years: 15.50    Types: Cigarettes    Quit date: 09/05/2018    Years since quitting: 0.4   Smokeless tobacco: Never Used  Substance Use Topics   Alcohol use: No    Alcohol/week: 0.0 standard drinks   Drug use: No     Colonoscopy:  PAP:  Bone density:  Lipid panel:  No Known Allergies  Current Outpatient Medications  Medication Sig Dispense Refill   albuterol (PROVENTIL HFA;VENTOLIN HFA) 108 (90 Base) MCG/ACT inhaler Inhale 2 puffs into the lungs every 6 (six) hours as needed for wheezing or shortness of breath. 1 Inhaler 0   ALPRAZolam (XANAX) 0.25 MG tablet Take 1 tablet (0.25 mg total)  by mouth daily as needed for anxiety or sleep. 60 tablet 0   aspirin 81 MG tablet Take 81 mg by mouth daily.     budesonide-formoterol (SYMBICORT) 160-4.5 MCG/ACT inhaler Inhale 2 puffs into the lungs 2 (two) times daily.     levothyroxine (SYNTHROID) 137 MCG tablet Take 137 mcg by mouth daily before breakfast.     ondansetron (ZOFRAN) 8 MG tablet Take 1 tablet (8 mg total) by mouth 2 (two) times daily as needed for refractory  nausea / vomiting. Start on day 3 after chemo. 30 tablet 2   prochlorperazine (COMPAZINE) 10 MG tablet Take 1 tablet (10 mg total) by mouth every 6 (six) hours as needed (Nausea or vomiting). 60 tablet 2   tamsulosin (FLOMAX) 0.4 MG CAPS capsule Take 1 capsule by mouth 1 day or 1 dose.     No current facility-administered medications for this visit.     OBJECTIVE: Vitals:   02/28/19 0921  BP: 106/68  Pulse: 76  Resp: 18  Temp: (!) 97 F (36.1 C)  SpO2: 96%     Body mass index is 31.42 kg/m.    ECOG FS:0 - Asymptomatic  General: Well-developed, well-nourished, no acute distress. Eyes: Pink conjunctiva, anicteric sclera. HEENT: Normocephalic, moist mucous membranes. Lungs: Clear to auscultation bilaterally. Heart: Regular rate and rhythm. No rubs, murmurs, or gallops. Abdomen: Soft, nontender, nondistended. No organomegaly noted, normoactive bowel sounds. Musculoskeletal: No edema, cyanosis, or clubbing. Neuro: Alert, answering all questions appropriately. Cranial nerves grossly intact. Skin: No rashes or petechiae noted. Psych: Flat affect.    LAB RESULTS:  Lab Results  Component Value Date   NA 139 02/28/2019   K 4.0 02/28/2019   CL 108 02/28/2019   CO2 20 (L) 02/28/2019   GLUCOSE 129 (H) 02/28/2019   BUN 22 02/28/2019   CREATININE 1.68 (H) 02/28/2019   CALCIUM 9.1 02/28/2019   PROT 6.7 02/28/2019   ALBUMIN 3.7 02/28/2019   AST 17 02/28/2019   ALT 17 02/28/2019   ALKPHOS 62 02/28/2019   BILITOT 0.8 02/28/2019   GFRNONAA 39 (L) 02/28/2019   GFRAA 45 (L) 02/28/2019    Lab Results  Component Value Date   WBC 4.5 02/28/2019   NEUTROABS 3.2 02/28/2019   HGB 14.2 02/28/2019   HCT 42.9 02/28/2019   MCV 97.7 02/28/2019   PLT 163 02/28/2019     STUDIES: Ct Chest W Contrast  Result Date: 02/27/2019 CLINICAL DATA:  Follow-up lung cancer, recent shortness of breath, nausea, fatigue, weight loss EXAM: CT CHEST, ABDOMEN, AND PELVIS WITH CONTRAST TECHNIQUE:  Multidetector CT imaging of the chest, abdomen and pelvis was performed following the standard protocol during bolus administration of intravenous contrast. CONTRAST:  151mL OMNIPAQUE IOHEXOL 300 MG/ML SOLN, additional oral enteric contrast COMPARISON:  PET-CT, 10/08/2018, CT chest, 09/21/2018 FINDINGS: CT CHEST FINDINGS Cardiovascular: Aortic atherosclerosis. Normal heart size. Three-vessel coronary artery calcifications. No pericardial effusion. Mediastinum/Nodes: Interval reduction in size of a previously FDG avid left hilar lymph node or very central left upper lobe pulmonary nodule, measuring 1.5 x 0.7 cm, previously 2.0 x 1.3 cm when measured similarly on prior noncontrast examinations (series 2, image 29). Thyroid gland, trachea, and esophagus demonstrate no significant findings. Lungs/Pleura: Severe centrilobular emphysema. Diffuse bilateral bronchial wall thickening. Significant interval reduction in size of a left upper lobe pulmonary nodule, now measuring 1.0 x 0.7 cm with new internal cavitation, previously measuring 2.0 x 2.0 cm (series 3, image 43). A small adjacent satellite nodule is resolved. A previously seen  small right upper lobe pulmonary nodule is resolved. No pleural effusion or pneumothorax. Musculoskeletal: No chest wall mass or suspicious bone lesions identified. CT ABDOMEN PELVIS FINDINGS Hepatobiliary: No solid liver abnormality is seen. No gallstones, gallbladder wall thickening, or biliary dilatation. Pancreas: Unremarkable. No pancreatic ductal dilatation or surrounding inflammatory changes. Spleen: Normal in size without significant abnormality. Adrenals/Urinary Tract: Adrenal glands are unremarkable. Kidneys are normal, without renal calculi, solid lesion, or hydronephrosis. Bladder is unremarkable. Stomach/Bowel: Stomach is within normal limits. Appendix appears normal. No evidence of bowel wall thickening, distention, or inflammatory changes. Sigmoid diverticulosis.  Vascular/Lymphatic: Aortic atherosclerosis. No enlarged abdominal or pelvic lymph nodes. Reproductive: Prostatomegaly. Other: Small fat containing left inguinal hernia. No abdominopelvic ascites. Musculoskeletal: No acute or significant osseous findings. IMPRESSION: 1. Significant interval reduction in size of a left upper lobe pulmonary nodule, now measuring 1.0 x 0.7 cm with new internal cavitation, previously measuring 2.0 x 2.0 cm (series 3, image 43). A small adjacent satellite nodule is resolved. A previously seen small right upper lobe pulmonary nodule is resolved. 2. Interval reduction in size of a previously FDG avid left hilar lymph node or very central left upper lobe pulmonary nodule, measuring 1.5 x 0.7 cm, previously 2.0 x 1.3 cm when measured similarly on prior noncontrast examinations (series 2, image 29). 3.  Above findings are consistent with treatment response. 4. Severe emphysema and diffuse bilateral bronchial wall thickening consistent with nonspecific infectious or inflammatory bronchitis. Emphysema (ICD10-J43.9). 5.  No evidence of metastatic disease in the abdomen or pelvis. 6. Chronic and incidental findings of the abdomen and pelvis as detailed above. 7.  Coronary artery disease.  Aortic Atherosclerosis (ICD10-I70.0). Electronically Signed   By: Eddie Candle M.D.   On: 02/27/2019 14:10   Ct Abdomen Pelvis W Contrast  Result Date: 02/27/2019 CLINICAL DATA:  Follow-up lung cancer, recent shortness of breath, nausea, fatigue, weight loss EXAM: CT CHEST, ABDOMEN, AND PELVIS WITH CONTRAST TECHNIQUE: Multidetector CT imaging of the chest, abdomen and pelvis was performed following the standard protocol during bolus administration of intravenous contrast. CONTRAST:  152mL OMNIPAQUE IOHEXOL 300 MG/ML SOLN, additional oral enteric contrast COMPARISON:  PET-CT, 10/08/2018, CT chest, 09/21/2018 FINDINGS: CT CHEST FINDINGS Cardiovascular: Aortic atherosclerosis. Normal heart size. Three-vessel  coronary artery calcifications. No pericardial effusion. Mediastinum/Nodes: Interval reduction in size of a previously FDG avid left hilar lymph node or very central left upper lobe pulmonary nodule, measuring 1.5 x 0.7 cm, previously 2.0 x 1.3 cm when measured similarly on prior noncontrast examinations (series 2, image 29). Thyroid gland, trachea, and esophagus demonstrate no significant findings. Lungs/Pleura: Severe centrilobular emphysema. Diffuse bilateral bronchial wall thickening. Significant interval reduction in size of a left upper lobe pulmonary nodule, now measuring 1.0 x 0.7 cm with new internal cavitation, previously measuring 2.0 x 2.0 cm (series 3, image 43). A small adjacent satellite nodule is resolved. A previously seen small right upper lobe pulmonary nodule is resolved. No pleural effusion or pneumothorax. Musculoskeletal: No chest wall mass or suspicious bone lesions identified. CT ABDOMEN PELVIS FINDINGS Hepatobiliary: No solid liver abnormality is seen. No gallstones, gallbladder wall thickening, or biliary dilatation. Pancreas: Unremarkable. No pancreatic ductal dilatation or surrounding inflammatory changes. Spleen: Normal in size without significant abnormality. Adrenals/Urinary Tract: Adrenal glands are unremarkable. Kidneys are normal, without renal calculi, solid lesion, or hydronephrosis. Bladder is unremarkable. Stomach/Bowel: Stomach is within normal limits. Appendix appears normal. No evidence of bowel wall thickening, distention, or inflammatory changes. Sigmoid diverticulosis. Vascular/Lymphatic: Aortic atherosclerosis. No enlarged abdominal or  pelvic lymph nodes. Reproductive: Prostatomegaly. Other: Small fat containing left inguinal hernia. No abdominopelvic ascites. Musculoskeletal: No acute or significant osseous findings. IMPRESSION: 1. Significant interval reduction in size of a left upper lobe pulmonary nodule, now measuring 1.0 x 0.7 cm with new internal cavitation,  previously measuring 2.0 x 2.0 cm (series 3, image 43). A small adjacent satellite nodule is resolved. A previously seen small right upper lobe pulmonary nodule is resolved. 2. Interval reduction in size of a previously FDG avid left hilar lymph node or very central left upper lobe pulmonary nodule, measuring 1.5 x 0.7 cm, previously 2.0 x 1.3 cm when measured similarly on prior noncontrast examinations (series 2, image 29). 3.  Above findings are consistent with treatment response. 4. Severe emphysema and diffuse bilateral bronchial wall thickening consistent with nonspecific infectious or inflammatory bronchitis. Emphysema (ICD10-J43.9). 5.  No evidence of metastatic disease in the abdomen or pelvis. 6. Chronic and incidental findings of the abdomen and pelvis as detailed above. 7.  Coronary artery disease.  Aortic Atherosclerosis (ICD10-I70.0). Electronically Signed   By: Eddie Candle M.D.   On: 02/27/2019 14:10    ASSESSMENT: Clinical stage IIB squamous cell carcinoma, left upper lobe lung.  PLAN:    1.Clinical stage IIB squamous cell carcinoma, left upper lobe lung: PET scan results from October 08, 2018 reviewed independently.  Patient underwent biopsy with navigational bronchoscopy on October 19, 2018 confirming diagnosis.  Patient declined surgery and wished to pursue XRT along with concurrent chemotherapy.  Given his stage of disease he does not qualify for maintenance immunotherapy.  He completed cycle 7 of weekly carboplatinum and Taxol on January 09, 2019 and then completed XRT on January 14, 2019.  Given his multiple complaints, I restaging CT scan was ordered and completed on February 27, 2019.  Results reviewed independently and report as above with significant improvement of patient's disease burden.  No intervention is needed at this time.  Patient has been instructed to keep his previously scheduled follow-up appointment in December for repeat imaging and further evaluation.    2.  History of  melanoma: Unclear stage or depth, although by report was in situ.  Patient had Mohs surgery in fall 2019.  Lung biopsy consistent with squamous cell carcinoma. 3.  Anxiety/depression: Continue current medications as prescribed.  Continue follow-up with primary care physician.  Patient also has an appointment with psychiatry at the [facility] in the next several weeks. 4.  Renal insufficiency: Creatinine is slightly elevated today at 1.68.  Likely secondary to dehydration.  Patient was encouraged to increase his fluid intake. 5.  Hypotension: Patient's blood pressure is decreased today and he is mildly orthostatic.  He will receive 1 L of IV fluids in clinic today. 6.  Nausea: Continue antiemetics as prescribed. 7.  Depression: Continue current medications as prescribed.  Psychiatry referral as above. 8.  Shortness of breath/cough: Likely secondary to patient's known underlying COPD.  Continue evaluation and treatment per pulmonary.   Patient expressed understanding and was in agreement with this plan. He also understands that He can call clinic at any time with any questions, concerns, or complaints.   Cancer Staging Squamous cell carcinoma lung, left ([place]) Staging form: Lung, AJCC 8th Edition - Clinical stage from 10/27/2018: Stage IIB (cT1c, cN1, cM0) - Signed by Lloyd Huger, MD on 11/16/2018   Lloyd Huger, MD   03/01/2019 6:27 AM

## 2019-03-20 ENCOUNTER — Encounter: Payer: Self-pay | Admitting: Nurse Practitioner

## 2019-03-29 ENCOUNTER — Other Ambulatory Visit: Payer: Self-pay | Admitting: Oncology

## 2019-03-29 DIAGNOSIS — C3492 Malignant neoplasm of unspecified part of left bronchus or lung: Secondary | ICD-10-CM

## 2019-03-30 ENCOUNTER — Other Ambulatory Visit: Payer: Self-pay

## 2019-03-30 ENCOUNTER — Emergency Department: Payer: No Typology Code available for payment source

## 2019-03-30 ENCOUNTER — Emergency Department
Admission: EM | Admit: 2019-03-30 | Discharge: 2019-03-30 | Disposition: A | Discharge status: Home / Self Care | Payer: No Typology Code available for payment source | Attending: Student | Admitting: Student

## 2019-03-30 DIAGNOSIS — R42 Dizziness and giddiness: Secondary | ICD-10-CM | POA: Insufficient documentation

## 2019-03-30 DIAGNOSIS — Z923 Personal history of irradiation: Secondary | ICD-10-CM | POA: Insufficient documentation

## 2019-03-30 DIAGNOSIS — Z7982 Long term (current) use of aspirin: Secondary | ICD-10-CM | POA: Insufficient documentation

## 2019-03-30 DIAGNOSIS — Z85118 Personal history of other malignant neoplasm of bronchus and lung: Secondary | ICD-10-CM | POA: Diagnosis not present

## 2019-03-30 DIAGNOSIS — Z9221 Personal history of antineoplastic chemotherapy: Secondary | ICD-10-CM | POA: Diagnosis not present

## 2019-03-30 DIAGNOSIS — Z79899 Other long term (current) drug therapy: Secondary | ICD-10-CM | POA: Diagnosis not present

## 2019-03-30 DIAGNOSIS — Z87891 Personal history of nicotine dependence: Secondary | ICD-10-CM | POA: Insufficient documentation

## 2019-03-30 DIAGNOSIS — E039 Hypothyroidism, unspecified: Secondary | ICD-10-CM | POA: Insufficient documentation

## 2019-03-30 DIAGNOSIS — I480 Paroxysmal atrial fibrillation: Secondary | ICD-10-CM | POA: Diagnosis not present

## 2019-03-30 DIAGNOSIS — J449 Chronic obstructive pulmonary disease, unspecified: Secondary | ICD-10-CM | POA: Diagnosis not present

## 2019-03-30 DIAGNOSIS — Z85828 Personal history of other malignant neoplasm of skin: Secondary | ICD-10-CM | POA: Diagnosis not present

## 2019-03-30 DIAGNOSIS — R55 Syncope and collapse: Secondary | ICD-10-CM | POA: Insufficient documentation

## 2019-03-30 LAB — COMPREHENSIVE METABOLIC PANEL
ALT: 17 U/L (ref 0–44)
AST: 20 U/L (ref 15–41)
Albumin: 3.7 g/dL (ref 3.5–5.0)
Alkaline Phosphatase: 63 U/L (ref 38–126)
Anion gap: 11 (ref 5–15)
BUN: 24 mg/dL — ABNORMAL HIGH (ref 8–23)
CO2: 21 mmol/L — ABNORMAL LOW (ref 22–32)
Calcium: 9 mg/dL (ref 8.9–10.3)
Chloride: 108 mmol/L (ref 98–111)
Creatinine, Ser: 1.45 mg/dL — ABNORMAL HIGH (ref 0.61–1.24)
GFR calc Af Amer: 54 mL/min — ABNORMAL LOW (ref >60)
GFR calc non Af Amer: 46 mL/min — ABNORMAL LOW (ref >60)
Glucose, Bld: 147 mg/dL — ABNORMAL HIGH (ref 70–99)
Potassium: 4 mmol/L (ref 3.5–5.1)
Sodium: 140 mmol/L (ref 135–145)
Total Bilirubin: 0.6 mg/dL (ref 0.3–1.2)
Total Protein: 6.5 g/dL (ref 6.5–8.1)

## 2019-03-30 LAB — PROTIME-INR
INR: 1.1 (ref 0.8–1.2)
Prothrombin Time: 14.1 seconds (ref 11.4–15.2)

## 2019-03-30 LAB — CBC WITH DIFFERENTIAL/PLATELET
Abs Immature Granulocytes: 0.03 10*3/uL (ref 0.00–0.07)
Basophils Absolute: 0 10*3/uL (ref 0.0–0.1)
Basophils Relative: 1 %
Eosinophils Absolute: 0.1 10*3/uL (ref 0.0–0.5)
Eosinophils Relative: 1 %
HCT: 42.4 % (ref 39.0–52.0)
Hemoglobin: 14.4 g/dL (ref 13.0–17.0)
Immature Granulocytes: 1 %
Lymphocytes Relative: 9 %
Lymphs Abs: 0.5 10*3/uL — ABNORMAL LOW (ref 0.7–4.0)
MCH: 32.7 pg (ref 26.0–34.0)
MCHC: 34 g/dL (ref 30.0–36.0)
MCV: 96.4 fL (ref 80.0–100.0)
Monocytes Absolute: 0.6 10*3/uL (ref 0.1–1.0)
Monocytes Relative: 10 %
Neutro Abs: 4.8 10*3/uL (ref 1.7–7.7)
Neutrophils Relative %: 78 %
Platelets: 151 10*3/uL (ref 150–400)
RBC: 4.4 MIL/uL (ref 4.22–5.81)
RDW: 12.9 % (ref 11.5–15.5)
WBC: 6.1 10*3/uL (ref 4.0–10.5)
nRBC: 0 % (ref 0.0–0.2)

## 2019-03-30 LAB — URINALYSIS, COMPLETE (UACMP) WITH MICROSCOPIC
Bacteria, UA: NONE SEEN
Bilirubin Urine: NEGATIVE
Glucose, UA: NEGATIVE mg/dL
Hgb urine dipstick: NEGATIVE
Ketones, ur: NEGATIVE mg/dL
Leukocytes,Ua: NEGATIVE
Nitrite: NEGATIVE
Protein, ur: NEGATIVE mg/dL
Specific Gravity, Urine: 1.017 (ref 1.005–1.030)
pH: 5 (ref 5.0–8.0)

## 2019-03-30 LAB — TSH: TSH: 0.25 u[IU]/mL — ABNORMAL LOW (ref 0.350–4.500)

## 2019-03-30 LAB — MAGNESIUM: Magnesium: 2.1 mg/dL (ref 1.7–2.4)

## 2019-03-30 LAB — APTT: aPTT: 28 seconds (ref 24–36)

## 2019-03-30 LAB — TROPONIN I (HIGH SENSITIVITY)
Troponin I (High Sensitivity): 8 ng/L (ref <18)
Troponin I (High Sensitivity): 10 ng/L (ref <18)

## 2019-03-30 LAB — T4, FREE: Free T4: 1.25 ng/dL — ABNORMAL HIGH (ref 0.61–1.12)

## 2019-03-30 MED ORDER — APIXABAN 5 MG PO TABS: 5.0000 mg | ORAL_TABLET | Freq: Two times a day (BID) | ORAL | 0 refills | Status: DC

## 2019-03-30 MED ORDER — SODIUM CHLORIDE 0.9 % IV BOLUS
500.0000 mL | Freq: Once | INTRAVENOUS | Status: AC
  Administered 2019-03-30: 500 mL via INTRAVENOUS

## 2019-03-30 NOTE — ED Provider Notes (Signed)
[facility] Emergency Department Provider Note  ____________________________________________   First MD Initiated Contact with Patient 03/30/19 1616     (approximate)  I have reviewed the triage vital signs and the nursing notes.  History  Chief Complaint Near Syncope    HPI Levi [name] is a 78 y.o. male with history of SCC of lung, on chemotherapy/radiation, melanoma, hypothyroidism, COPD who presents to the ED for an episode of near syncope.   Patient states he was mowing the lawn for approximately 2 hours today.  He began to feel lightheaded, and fell to his knees.  He denies any LOC, head injury.  He denies any true dizziness or room spinning sensation. He denies any preceding chest pain, palpitations, shortness of breath, nausea, vomiting.  Patient thinks he "overdid it" and got dehydrated, suspects this is the etiology of his symptoms. He is asymptomatic on arrival to the ED.    He denies any symptoms at present.  He denies any recent illnesses, no known sick contacts.  He was noted to be in atrial fibrillation with EMS, and on arrival EKG here.  He denies any known prior history of this.   Past Medical Hx Past Medical History:  Diagnosis Date  . Asthma   . Cancer ([place])   . COPD (chronic obstructive pulmonary disease) ([place])   . Hearing loss   . Hypothyroidism   . Mass of left lung   . 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 Prostate enlargement   . Shortness of breath   . Thyroid disease   . Tobacco abuse     Problem List Patient Active Problem List   Diagnosis Date Noted  . Depression 02/01/2019  . Goals of care, counseling/discussion 11/25/2018  . Squamous cell carcinoma lung, left ([place]) 10/27/2018  . Nodule of upper lobe of left lung 10/20/2018  . COPD (chronic obstructive pulmonary disease) with emphysema ([place]) 10/23/2015  . Smoking addiction 10/31/2014    Past Surgical Hx Past Surgical History:  Procedure Laterality Date  .  ELECTROMAGNETIC NAVIGATION BROCHOSCOPY Left 10/19/2018   Procedure: ELECTROMAGNETIC NAVIGATION BRONCHOSCOPY LEFT;  Surgeon: Tyler Pita, MD;  Location: [facility] ORS;  Service: Cardiopulmonary;  Laterality: Left;  . ENDOBRONCHIAL ULTRASOUND Left 10/19/2018   Procedure: ENDOBRONCHIAL ULTRASOUND LEFT;  Surgeon: Tyler Pita, MD;  Location: [facility] ORS;  Service: Cardiopulmonary;  Laterality: Left;  Marland Kitchen MELANOMA EXCISION Left   . NO PAST SURGERIES      Medications Prior to Admission medications   Medication Sig Start Date End Date Taking? Authorizing Provider  albuterol (PROVENTIL HFA;VENTOLIN HFA) 108 (90 Base) MCG/ACT inhaler Inhale 2 puffs into the lungs every 6 (six) hours as needed for wheezing or shortness of breath. 10/16/15   Shawnee Knapp, MD  ALPRAZolam Duanne Moron) 0.25 MG tablet Take 1 tablet (0.25 mg total) by mouth daily as needed for anxiety or sleep. 02/18/19   Verlon Au, NP  aspirin 81 MG tablet Take 81 mg by mouth daily.    [provider]  budesonide-formoterol (SYMBICORT) 160-4.5 MCG/ACT inhaler Inhale 2 puffs into the lungs 2 (two) times daily.    [provider]  levothyroxine (SYNTHROID) 137 MCG tablet Take 137 mcg by mouth daily before breakfast.    [provider]  ondansetron (ZOFRAN) 8 MG tablet TAKE 1 TABLET (8 MG TOTAL) BY MOUTH 2 (TWO) TIMES DAILY AS NEEDED FOR REFRACTORY NAUSEA / VOMITING. START ON DAY 3 AFTER CHEMO. 03/30/19   Lloyd Huger,  MD  prochlorperazine (COMPAZINE) 10 MG tablet Take 1 tablet (10 mg total) by mouth every 6 (six) hours as needed (Nausea or vomiting). 02/18/19   Lloyd Huger, MD  tamsulosin (FLOMAX) 0.4 MG CAPS capsule Take 1 capsule by mouth 1 day or 1 dose. 09/18/18   [provider]    Allergies Patient has no known allergies.  Family Hx Family History  Problem Relation Age of Onset  . Aneurysm Mother   . Cancer Father     Social Hx Social History   Tobacco Use  . Smoking status:  Former Smoker    Packs/day: 0.25    Years: 62.00    Pack years: 15.50    Types: Cigarettes    Quit date: 09/05/2018    Years since quitting: 0.5  . Smokeless tobacco: Never Used  Substance Use Topics  . Alcohol use: No    Alcohol/week: 0.0 standard drinks  . Drug use: No     Review of Systems  Constitutional: Negative for fever, chills. + lightheadedness, near syncope Eyes: Negative for visual changes. ENT: Negative for sore throat. Cardiovascular: Negative for chest pain. Respiratory: Negative for shortness of breath. Gastrointestinal: Negative for nausea, vomiting.  Genitourinary: Negative for dysuria. Musculoskeletal: Negative for leg swelling. Skin: Negative for rash. Neurological: Negative for for headaches.   Physical Exam  Vital Signs: ED Triage Vitals  Enc Vitals Group     BP 03/30/19 1615 123/63     Pulse Rate 03/30/19 1615 71     Resp 03/30/19 1615 18     Temp 03/30/19 1615 98.8 F (37.1 C)     Temp Source 03/30/19 1615 Oral     SpO2 03/30/19 1608 100 %     Weight 03/30/19 1618 210 lb (95.3 kg)     Height 03/30/19 1618 6' (1.829 m)     Head Circumference --      Peak Flow --      Pain Score 03/30/19 1616 0     Pain Loc --      Pain Edu? --      Excl. in [place]? --     Constitutional: Alert and oriented.  Head: Normocephalic. Atraumatic. Eyes: Conjunctivae clear. Sclera anicteric. Nose: No congestion. No rhinorrhea. Mouth/Throat: Wearing mask.  Neck: No stridor.   Cardiovascular: Irregularly irregular, HR within normal limits. Extremities well perfused. Respiratory: Normal respiratory effort.  Lungs CTAB. Gastrointestinal: Soft. Non-tender. Non-distended.  Musculoskeletal: No lower extremity edema. No deformities. Neurologic:  Normal speech and language. No gross focal neurologic deficits are appreciated.  Skin: Skin is warm, dry and intact. No rash noted. Psychiatric: Mood and affect are appropriate for situation.  EKG  Personally reviewed.    Rate: 70s Rhythm: atrial fibrillation Axis: borderline leftward Intervals: WNL AF, HR within normal limits No STEMI  Review of prior EKG reveals NSR with 1st degree AV block. AF appears new.   Repeat EKG, 18:30 Rate: 87 Rhythm: sinus Axis: normal Intervals: 1st degree AV block No acute ischemic changes No STEMI  Repeat EKG demonstrates return to NSR, with first-degree AV block.    Presentation consistent with paroxysmal A. fib.   Radiology  CXR: IMPRESSION:  Mild cardiac enlargement with pulmonary vascularity normal. No edema  or consolidation.    Procedures  Procedure(s) performed (including critical care):  Procedures   Initial Impression / Assessment and Plan / ED Course  78 y.o. male who presents to the ED for an episode of lightheadedness, found to be in new onset, rate  controlled AF.  Ddx: arrhythmia (new onset AF), dehydration, electrolyte abnormality, UTI, thyroid disease  Will obtain labs, EKG, XR, give fluids and reassess.   EKG on arrival appears to be atrial fibrillation, rate controlled.  Cardiac monitoring appears to be atrial fibrillation as well.  Labs without actionable derangements.  Creatinine mildly elevated, but appears consistent with baseline, receiving fluids.  Troponin negative.  Minimally elevated T4. UA negative. Updated patient on results.    During his stay in the emergency department it does appear he converted back to NSR, first-degree AV block. Repeat EKG confirms.   Discussed new diagnosis of atrial fibrillation with cardiology, Dr. Lovena Le, who agrees the patient is a candidate for anticoagulation.  Discussed with the patient's oncologist as well, Dr. Grayland Ormond, who is in agreement.  Will initiate Eliquis.  Given his heart rate is controlled, no need for rate control at this time.  He should follow up with cardiology as an outpatient, as well as his general medical doctor regarding his thyroid studies. He voices understanding of plan  and is comfortable with discharge. Given return precautions.    Final Clinical Impression(s) / ED Diagnosis  Final diagnoses:  Near syncope  New onset atrial fibrillation [facility])       Note:  This document was prepared using Dragon voice recognition software and may include unintentional dictation errors.   Lilia Pro., MD 03/31/19 [phone]

## 2019-03-30 NOTE — ED Notes (Signed)
Pt reminded of need for urine sample. Wife at bedside.

## 2019-03-30 NOTE — ED Notes (Signed)
Family at bedside. 

## 2019-03-30 NOTE — ED Notes (Signed)
Pt provided urinal and instructed on use for urine specimen collection.

## 2019-03-30 NOTE — Discharge Instructions (Signed)
Thank you for letting us take care of you in the emergency department today.   Please continue to take any regular, prescribed medications.   New medications we have prescribed:  - Eliquis - this is a blood thinning medication to help reduce the risk of stroke   Please follow up with: - The cardiology doctors, call the number below and schedule an appointment - Your primary care doctor to review your ER visit and follow up on your symptoms. Have them recheck your thyroid studies, as this can affect your heart rhythm.  Please return to the ER for any new or worsening symptoms.

## 2019-03-30 NOTE — ED Notes (Signed)
Blue top sent to lab. 

## 2019-03-30 NOTE — ED Notes (Signed)
Pt provided po fluids per MD request.

## 2019-03-30 NOTE — ED Triage Notes (Signed)
Per ems, pt was on riding mower for two hours and family saw him fall to knees. Pt reports dizziness and near syncope at this time. "I think I was just dehydrated and over-did it." pt denies CP, SOB, n/v.  Pt is AOx4. NAD noted.

## 2019-04-01 ENCOUNTER — Telehealth: Payer: Self-pay

## 2019-04-01 NOTE — Telephone Encounter (Signed)
Per Dr. Lovena Le patient needs [facility] ed for near syncope.  Attempted to contact home # no ans.   Attempted to contact via cell.  Lmov.

## 2019-04-01 NOTE — Telephone Encounter (Signed)
Scheduled

## 2019-04-02 ENCOUNTER — Ambulatory Visit (INDEPENDENT_AMBULATORY_CARE_PROVIDER_SITE_OTHER): Payer: Medicare Other | Admitting: Cardiology

## 2019-04-02 ENCOUNTER — Other Ambulatory Visit: Payer: Self-pay

## 2019-04-02 ENCOUNTER — Encounter: Payer: Self-pay | Admitting: Cardiology

## 2019-04-02 VITALS — BP 111/68 | HR 86 | Ht 71.0 in | Wt 226.2 lb

## 2019-04-02 DIAGNOSIS — I951 Orthostatic hypotension: Secondary | ICD-10-CM

## 2019-04-02 DIAGNOSIS — I480 Paroxysmal atrial fibrillation: Secondary | ICD-10-CM | POA: Diagnosis not present

## 2019-04-02 DIAGNOSIS — R946 Abnormal results of thyroid function studies: Secondary | ICD-10-CM

## 2019-04-02 MED ORDER — APIXABAN 5 MG PO TABS: 5.0000 mg | ORAL_TABLET | Freq: Two times a day (BID) | ORAL | 3 refills | Status: DC

## 2019-04-02 NOTE — Patient Instructions (Addendum)
Medication Instructions:  Your physician has recommended you make the following change in your medication:  STOP Aspirin   Please take the written prescription given to you today to the [place].  *If you need a refill on your cardiac medications before your next appointment, please call your pharmacy*  Lab Work: None ordered If you have labs (blood work) drawn today and your tests are completely normal, you will receive your results only by: Marland Kitchen MyChart Message (if you have MyChart) OR . A paper copy in the mail If you have any lab test that is abnormal or we need to change your treatment, we will call you to review the results.  Testing/Procedures: Your physician has requested that you have an echocardiogram. Echocardiography is a painless test that uses sound waves to create images of your heart. It provides your doctor with information about the size and shape of your heart and how well your heart's chambers and valves are working. This procedure takes approximately one hour. There are no restrictions for this procedure.    Follow-Up: At [facility], you and your health needs are our priority.  As part of our continuing mission to provide you with exceptional heart care, we have created designated Provider Care Teams.  These Care Teams include your primary Cardiologist (physician) and Advanced Practice Providers (APPs -  Physician Assistants and Nurse Practitioners) who all work together to provide you with the care you need, when you need it.  Your next appointment:   2 month(s)  The format for your next appointment:   In Person  Provider:    You may see Kate Sable, MD or one of the following Advanced Practice Providers on your designated Care Team:    Murray Hodgkins, NP  Christell Faith, PA-C  Marrianne Mood, PA-C   Other Instructions N/A

## 2019-04-02 NOTE — Progress Notes (Signed)
Cardiology Office Note:    Date:  04/02/2019   ID:  Levi [name], DOB 08-Dec-1940, MRN [id number]  PCP:  Blane Ohara, MD  Cardiologist:  Kate Sable, MD  Electrophysiologist:  None   Referring MD: Lilia Pro., MD   Chief Complaint  Patient presents with  . New Patient (Initial Visit)    Hospital F/U-syncope. Patient reports intermittent dizziness; Meds verbally reviewed with patient.    History of Present Illness:    Levi [name] is a 78 y.o. 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  Patient was seen 3 days ago in the ED due to nearly passing out.  He was mowing his lawn for about 2 hours on that day, and then suddenly felt lightheaded and fell on his knees.  He denied any head injury or loss of consciousness.  He denies any prior or car rides.  He denies any prior cardiac history.  Denies preceding palpitations, chest pain, shortness of breath.  Patient felt he worked for too long and got dehydrated.  He states not liking to drink water.  His wife has been trying to have him drink Kool-Aid.  He was taken to the emergency room per EMS.  Upon EMS arrival, patient was noted to be in atrial fibrillation.  EKG in the emergency room confirmed A. fib with heart rate of 77.  He denies any prior history of atrial fibrillation.  His high-sensitivity troponins were within normal limits in the ED, patient was given IV fluids.  Repeat EKG showed conversion to sinus rhythm.  Patient was started on Eliquis and advised to follow-up with cardiology with diagnosis of paroxysmal atrial fibrillation.   Past Medical History:  Diagnosis Date  . Asthma   . Cancer ([place])   . COPD (chronic obstructive pulmonary disease) ([place])   . Hearing loss   . Hypothyroidism   . Mass of left lung   . 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 Prostate  enlargement   . Shortness of breath   . Thyroid disease   . Tobacco abuse     Past Surgical History:  Procedure Laterality Date  . ELECTROMAGNETIC NAVIGATION BROCHOSCOPY Left 10/19/2018   Procedure: ELECTROMAGNETIC NAVIGATION BRONCHOSCOPY LEFT;  Surgeon: Tyler Pita, MD;  Location: [facility] ORS;  Service: Cardiopulmonary;  Laterality: Left;  . ENDOBRONCHIAL ULTRASOUND Left 10/19/2018   Procedure: ENDOBRONCHIAL ULTRASOUND LEFT;  Surgeon: Tyler Pita, MD;  Location: [facility] ORS;  Service: Cardiopulmonary;  Laterality: Left;  Marland Kitchen MELANOMA EXCISION Left   . NO PAST SURGERIES      Current Medications: Current Meds  Medication Sig  . albuterol (PROVENTIL HFA;VENTOLIN HFA) 108 (90 Base) MCG/ACT inhaler Inhale 2 puffs into the lungs every 6 (six) hours as needed for wheezing or shortness of breath.  Marland Kitchen apixaban (ELIQUIS) 5 MG TABS tablet Take 1 tablet (5 mg total) by mouth 2 (two) times daily.  . budesonide-formoterol (SYMBICORT) 160-4.5 MCG/ACT inhaler Inhale 2 puffs into the lungs 2 (two) times daily.  Marland Kitchen levothyroxine (SYNTHROID) 137 MCG tablet Take 137 mcg by mouth daily before breakfast.  . tamsulosin (FLOMAX) 0.4 MG CAPS capsule Take 0.4 mg by mouth daily.   . vitamin B-12 (CYANOCOBALAMIN) 1000 MCG tablet Take 1,000 mcg by mouth daily.  . Vitamin D, Ergocalciferol, (DRISDOL) 1.25 MG (50000 UT) CAPS capsule Take  50,000 Units by mouth every 7 (seven) days.  . [DISCONTINUED] aspirin 81 MG tablet Take 81 mg by mouth daily.     Allergies:   Patient has no known allergies.   Social History   Socioeconomic History  . Marital status: Married    Spouse name: Levi [name]  . Number of children: 5  . Years of education: Not on file  . Highest education level: Not on file  Occupational History  . Not on file  Social Needs  . Financial resource strain: Not very hard  . Food insecurity    Worry: Never true    Inability: Never true  . Transportation needs    Medical: No    Non-medical: No   Tobacco Use  . Smoking status: Former Smoker    Packs/day: 0.25    Years: 62.00    Pack years: 15.50    Types: Cigarettes    Quit date: 09/05/2018    Years since quitting: 0.5  . Smokeless tobacco: Never Used  Substance and Sexual Activity  . Alcohol use: No    Alcohol/week: 0.0 standard drinks  . Drug use: No  . Sexual activity: Not on file  Lifestyle  . Physical activity    Days per week: Not on file    Minutes per session: Not on file  . Stress: Very much  Relationships  . Social connections    Talks on phone: More than three times a week    Gets together: Once a week    Attends religious service: Not on file    Active member of club or organization: Not on file    Attends meetings of clubs or organizations: Not on file    Relationship status: Not on file  Other Topics Concern  . 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 He is married to his wife of 30+ years, Levi [name] and has 5 children (1 deceased), many grand children, and one great grand child.      Family History: The patient's family history includes Aneurysm in his mother; Cancer in his father.  ROS:   Please see the history of present illness.     All other systems reviewed and are negative.  EKGs/Labs/Other Studies Reviewed:    The following studies were reviewed today:   EKG:  EKG is  ordered today.  The ekg ordered today demonstrates normal sinus rhythm, first-degree AV block, otherwise normal ECG.  Recent Labs: 03/30/2019: ALT 17; BUN 24; Creatinine, Ser 1.45; Hemoglobin 14.4; Magnesium 2.1; Platelets 151; Potassium 4.0; Sodium 140; TSH 0.250  Recent Lipid Panel No results found for: CHOL, TRIG, HDL, CHOLHDL, VLDL, LDLCALC, LDLDIRECT  Physical Exam:    VS:  BP 111/68 (BP Location: Right Arm, Patient  Position: Sitting, Cuff Size: Normal)   [name] 86   Ht 5\' 11"  (1.803 m)   Wt 226 lb 4 oz (102.6 kg)   SpO2 95%   BMI 31.56 kg/m     Wt Readings from Last 3 Encounters:  04/02/19 226 lb 4 oz (102.6 kg)  03/30/19 210 lb (95.3 kg)  02/28/19 225 lb 4.8 oz (102.2 kg)     GEN:  Well nourished, well developed in no acute distress HEENT: Normal NECK: No JVD; No carotid bruits LYMPHATICS: No lymphadenopathy CARDIAC: RRR, no murmurs,  rubs, gallops RESPIRATORY:  Clear to auscultation without rales, wheezing or rhonchi  ABDOMEN: Soft, non-tender, non-distended MUSCULOSKELETAL:  No edema; No deformity  SKIN: Warm and dry NEUROLOGIC:  Alert and oriented x 3 PSYCHIATRIC:  Normal affect    HR BP SYMPTOMS  Lying 89  126/79  none  Sitting 89  103/68  none  Standing (33min) 96 77/48  none  Standing (89min) 100 90/58  none    ASSESSMENT:   EKG from the ED reviewed by myself and consistent with paroxysmal atrial fibrillation.  His CHA2DS2-VASc score is 2.  Orthostatic vitals suggest orthostasis.  His thyroid function studies are consistent with hyperthyroid or possible overmedication with Synthroid.  1. Paroxysmal atrial fibrillation ([place])   2. Abnormal thyroid function test   3. Orthostatic hypotension    PLAN:    In order of problems listed above:  1.  Get echocardiogram.  Continue Eliquis 5 mg twice daily. 2.  Thyroid dysfunction management as per primary care provider/endocrinology.  Patient advised to discuss management plans with PCP. 3.  Patient encouraged on adequate hydration.  He was advised to slowly rise from a seated position to prevent dizziness and falls.  Follow-up in 2 months  This note was generated in part or whole with voice recognition software. Voice recognition is usually quite accurate but there are transcription errors that can and very often do occur. I apologize for any typographical errors that were not detected and corrected.   Medication Adjustments/Labs  and Tests Ordered: Current medicines are reviewed at length with the patient today.  Concerns regarding medicines are outlined above.  Orders Placed This Encounter  Procedures  . EKG 12-Lead  . ECHOCARDIOGRAM COMPLETE   Meds ordered this encounter  Medications  . apixaban (ELIQUIS) 5 MG TABS tablet    Sig: Take 1 tablet (5 mg total) by mouth 2 (two) times daily.    Dispense:  180 tablet    Refill:  3    Patient Instructions  Medication Instructions:  Your physician has recommended you make the following change in your medication:  STOP Aspirin   Please take the written prescription given to you today to the [place].  *If you need a refill on your cardiac medications before your next appointment, please call your pharmacy*  Lab Work: None ordered If you have labs (blood work) drawn today and your tests are completely normal, you will receive your results only by: Marland Kitchen MyChart Message (if you have MyChart) OR . A paper copy in the mail If you have any lab test that is abnormal or we need to change your treatment, we will call you to review the results.  Testing/Procedures: Your physician has requested that you have an echocardiogram. Echocardiography is a painless test that uses sound waves to create images of your heart. It provides your doctor with information about the size and shape of your heart and how well your heart's chambers and valves are working. This procedure takes approximately one hour. There are no restrictions for this procedure.    Follow-Up: At [facility], you and your health needs are our priority.  As part of our continuing mission to provide you with exceptional heart care, we have created designated Provider Care Teams.  These Care Teams include your primary Cardiologist (physician) and Advanced Practice Providers (APPs -  Physician Assistants and Nurse Practitioners) who all work together to provide you with the care you need, when you need it.  Your next  appointment:   2  month(s)  The format for your next appointment:   In Person  Provider:    You may see Kate Sable, MD or one of the following Advanced Practice Providers on your designated Care Team:    Murray Hodgkins, NP  Christell Faith, PA-C  Marrianne Mood, PA-C   Other Instructions N/A     Signed, Kate Sable, MD  04/02/2019 10:41 AM    [place]

## 2019-04-14 NOTE — Progress Notes (Signed)
Levi [name]  [phone] [phone]) [phone]  ID: [name] OB: 08-24-1940  MR#: [id number]  LGX#:[id number]  Patient Care Team: Blane Ohara, MD as PCP - General (Family Medicine) Kate Sable, MD as PCP - Cardiology (Cardiology) Telford Nab, RN as Registered Nurse  CHIEF COMPLAINT: Clinical stage IIB squamous cell carcinoma, left upper lobe lung.  INTERVAL HISTORY: Patient returns to clinic today for further evaluation and discussion of his imaging results.  He continues to have a depressed affect, but otherwise feels well.  He has no neurologic complaints.  He denies any recent fevers or illnesses.  He does not complain of any cough or shortness of breath today.  He denies any hemoptysis or chest pain.  He has a good appetite and denies weight loss.  He denies any nausea, vomiting, constipation, or diarrhea.  He has no urinary complaints.  Patient offers no further specific complaints today.  REVIEW OF SYSTEMS:   Review of Systems  Constitutional: Negative.  Negative for fever, malaise/fatigue and weight loss.  HENT: Negative.  Negative for congestion.   Respiratory: Negative.  Negative for cough, hemoptysis and shortness of breath.   Cardiovascular: Negative.  Negative for chest pain and leg swelling.  Gastrointestinal: Negative.  Negative for abdominal pain and nausea.  Genitourinary: Negative.  Negative for dysuria.  Musculoskeletal: Negative.  Negative for back pain.  Skin: Negative for rash.  Neurological: Negative for dizziness, focal weakness, weakness and headaches.  Endo/Heme/Allergies: Does not bruise/bleed easily.  Psychiatric/Behavioral: Positive for depression. The patient is not nervous/anxious.     As per HPI. Otherwise, a complete review of systems is negative.  PAST MEDICAL HISTORY: Past Medical History:  Diagnosis Date  . Asthma   . Cancer ([place])   . COPD (chronic obstructive pulmonary disease) ([place])   . Hearing  loss   . Hypothyroidism   . Mass of left lung   . 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 Prostate enlargement   . Shortness of breath   . Thyroid disease   . Tobacco abuse     PAST SURGICAL HISTORY: Past Surgical History:  Procedure Laterality Date  . ELECTROMAGNETIC NAVIGATION BROCHOSCOPY Left 10/19/2018   Procedure: ELECTROMAGNETIC NAVIGATION BRONCHOSCOPY LEFT;  Surgeon: Tyler Pita, MD;  Location: [facility] ORS;  Service: Cardiopulmonary;  Laterality: Left;  . ENDOBRONCHIAL ULTRASOUND Left 10/19/2018   Procedure: ENDOBRONCHIAL ULTRASOUND LEFT;  Surgeon: Tyler Pita, MD;  Location: [facility] ORS;  Service: Cardiopulmonary;  Laterality: Left;  Marland Kitchen MELANOMA EXCISION Left   . NO PAST SURGERIES      FAMILY HISTORY: Family History  Problem Relation Age of Onset  . Aneurysm Mother   . Cancer Father     ADVANCED DIRECTIVES (Y/N):  N  HEALTH MAINTENANCE: Social History   Tobacco Use  . Smoking status: Former Smoker    Packs/day: 0.25    Years: 62.00    Pack years: 15.50    Types: Cigarettes    Quit date: 09/05/2018    Years since quitting: 0.6  . Smokeless tobacco: Never Used  Substance Use Topics  . Alcohol use: No    Alcohol/week: 0.0 standard drinks  . Drug use: No     Colonoscopy:  PAP:  Bone density:  Lipid panel:  No Known Allergies  Current Outpatient Medications  Medication Sig Dispense Refill  . albuterol (PROVENTIL HFA;VENTOLIN HFA) 108 (90 Base) MCG/ACT inhaler Inhale 2 puffs into the lungs every 6 (six) hours as needed for wheezing or  shortness of breath. 1 Inhaler 0  . apixaban (ELIQUIS) 5 MG TABS tablet Take 1 tablet (5 mg total) by mouth 2 (two) times daily. 180 tablet 3  . budesonide-formoterol (SYMBICORT) 160-4.5 MCG/ACT inhaler Inhale 2 puffs into the lungs 2 (two) times daily.    Marland Kitchen levothyroxine (SYNTHROID) 137 MCG tablet Take 137 mcg by mouth daily before breakfast.    . tamsulosin (FLOMAX) 0.4 MG CAPS capsule Take 0.4 mg by mouth daily.     .  vitamin B-12 (CYANOCOBALAMIN) 1000 MCG tablet Take 1,000 mcg by mouth daily.    . Vitamin D, Ergocalciferol, (DRISDOL) 1.25 MG (50000 UT) CAPS capsule Take 50,000 Units by mouth every 7 (seven) days.     No current facility-administered medications for this visit.    OBJECTIVE: Vitals:   04/17/19 1015  BP: 133/86  Pulse: 93  Resp: 19  Temp: 97.9 F (36.6 C)  SpO2: 100%     Body mass index is 32.09 kg/m.    ECOG FS:0 - Asymptomatic  General: Well-developed, well-nourished, no acute distress. Eyes: Pink conjunctiva, anicteric sclera. HEENT: Normocephalic, moist mucous membranes. Lungs: No audible wheezing or coughing. Heart: Regular rate and rhythm. Abdomen: Soft, nontender, no obvious distention. Musculoskeletal: No edema, cyanosis, or clubbing. Neuro: Alert, answering all questions appropriately. Cranial nerves grossly intact. Skin: No rashes or petechiae noted. Psych: Flat affect.   LAB RESULTS:  Lab Results  Component Value Date   NA 140 04/17/2019   K 4.4 04/17/2019   CL 106 04/17/2019   CO2 25 04/17/2019   GLUCOSE 116 (H) 04/17/2019   BUN 15 04/17/2019   CREATININE 1.22 04/17/2019   CALCIUM 9.2 04/17/2019   PROT 6.8 04/17/2019   ALBUMIN 3.8 04/17/2019   AST 21 04/17/2019   ALT 22 04/17/2019   ALKPHOS 62 04/17/2019   BILITOT 0.7 04/17/2019   GFRNONAA 57 (L) 04/17/2019   GFRAA >60 04/17/2019    Lab Results  Component Value Date   WBC 5.6 04/17/2019   NEUTROABS 3.9 04/17/2019   HGB 14.8 04/17/2019   HCT 45.7 04/17/2019   MCV 100.9 (H) 04/17/2019   PLT 158 04/17/2019     STUDIES: NM PET Image Restag (PS) Skull Base To Thigh  Result Date: 04/15/2019 CLINICAL DATA:  Subsequent treatment strategy for stage IIB left upper lobe squamous cell lung cancer status post concurrent chemotherapy and radiation therapy completed 01/14/2019. EXAM: NUCLEAR MEDICINE PET SKULL BASE TO THIGH TECHNIQUE: 12.6 mCi F-18 FDG was injected intravenously. Full-ring PET imaging  was performed from the skull base to thigh after the radiotracer. CT data was obtained and used for attenuation correction and anatomic localization. Fasting blood glucose: 99 mg/dl COMPARISON:  02/27/2019 CT chest, abdomen and pelvis. 10/08/2018 PET-CT. FINDINGS: Mediastinal blood pool activity: SUV max 2.9 Liver activity: SUV max NA NECK: No hypermetabolic lymph nodes in the neck. Incidental CT findings: none CHEST: Newly cavitary 1.3 cm left upper lobe pulmonary nodule with max SUV 1.5 (series 3/image 82), previously 2.0 cm with max SUV 15.2, decreased in size and substantially decreased in metabolism. Right upper lobe 0.3 cm solid pulmonary nodule (series 3/image 82) below PET resolution and without significant metabolism, decreased from 0.6 cm. No acute consolidative airspace disease, lung masses or new significant pulmonary nodules. Left suprahilar lymph node demonstrates max SUV 3.3, previous max SUV 12.0, substantially decreased. No new enlarged or hypermetabolic axillary, mediastinal or hilar lymph nodes. Incidental CT findings: Severe centrilobular emphysema with diffuse bronchial wall thickening and saber sheath trachea. Coronary  atherosclerosis. Atherosclerotic nonaneurysmal thoracic aorta. ABDOMEN/PELVIS: No abnormal hypermetabolic activity within the liver, pancreas, adrenal glands, or spleen. No hypermetabolic lymph nodes in the abdomen or pelvis. Incidental CT findings: Moderate left colonic diverticulosis. Atherosclerotic abdominal aorta with stable ectatic 2.6 cm infrarenal abdominal aorta. Moderate prostatomegaly. Nonspecific internal prostatic calcifications. Simple exophytic 2.1 cm upper right renal cyst. SKELETON: No focal hypermetabolic activity to suggest skeletal metastasis. Incidental CT findings: none IMPRESSION: 1. Interval response to therapy since 10/08/2018 PET-CT. Left upper lobe pulmonary nodule is newly cavitary, decreased in size and significantly decreased in metabolism. Mild  residual hypermetabolism within the left suprahilar lymph node, significantly decreased. Tiny right upper lobe pulmonary nodule is decreased in size and demonstrates no significant residual uptake. 2. No new or progressive hypermetabolic metastatic disease. 3. Chronic findings include: Aortic Atherosclerosis (ICD10-I70.0) and Emphysema (ICD10-J43.9). Coronary atherosclerosis. 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 Moderate prostatomegaly. Moderate left colonic diverticulosis. Electronically Signed   By: Ilona Sorrel M.D.   On: 04/15/2019 13:09   DG Chest Port 1 View  Result Date: 03/30/2019 CLINICAL DATA:  Atrial fibrillation.  Near syncope. EXAM: PORTABLE CHEST 1 VIEW COMPARISON:  None. FINDINGS: There is no edema or consolidation. Heart is mildly enlarged with pulmonary vascularity normal. No adenopathy. No bone lesions. IMPRESSION: Mild cardiac enlargement with pulmonary vascularity normal. No edema or consolidation. Electronically Signed   By: Lowella Grip III M.D.   On: 03/30/2019 16:47    ASSESSMENT: Clinical stage IIB squamous cell carcinoma, left upper lobe lung.  PLAN:    1.Clinical stage IIB squamous cell carcinoma, left upper lobe lung: PET scan results from October 08, 2018 reviewed independently.  Patient underwent biopsy with navigational bronchoscopy on October 19, 2018 confirming diagnosis.  Patient declined surgery and wished to pursue XRT along with concurrent chemotherapy.  Given his stage of disease he does not qualify for maintenance immunotherapy.  He completed cycle 7 of weekly carboplatinum and Taxol on January 09, 2019 and then completed XRT on January 14, 2019.  PET scan results from April 15, 2019 reviewed independently and reported as above with no obvious evidence of recurrent or progressive disease.  No intervention is needed at that time.  Return to clinic in 3 months with repeat imaging using CT scan and further evaluation.   2.  History of melanoma: Unclear stage  or depth, although by report was in situ.  Patient had Mohs surgery in fall 2019.  Lung biopsy consistent with squamous cell carcinoma. 3.  Anxiety/depression: Continue current medications as prescribed.  Continue follow-up with primary care physician as well as psychiatry at the [place]. 4.  Renal insufficiency: Resolved. 5.  Hypotension: Resolved. 6.  Nausea: Patient does not complain of this today.  Continue antiemetics as needed.   7.  Depression: Continue current medications as prescribed.  Psychiatry referral as above. 8.  Shortness of breath/cough: Patient does not complain of this today.  Continue follow-up and treatment per pulmonary.  Patient expressed understanding and was in agreement with this plan. He also understands that He can call clinic at any time with any questions, concerns, or complaints.   Cancer Staging Squamous cell carcinoma lung, left ([place]) Staging form: Lung, AJCC 8th Edition - Clinical stage from 10/27/2018: Stage IIB (cT1c, cN1, cM0) - Signed by Lloyd Huger, MD on 11/16/2018   Lloyd Huger, MD   04/17/2019 12:39 PM

## 2019-04-15 ENCOUNTER — Ambulatory Visit
Admission: RE | Admit: 2019-04-15 | Discharge: 2019-04-15 | Disposition: A | Discharge status: Home / Self Care | Payer: No Typology Code available for payment source | Source: Ambulatory Visit | Attending: Oncology | Admitting: Oncology

## 2019-04-15 ENCOUNTER — Other Ambulatory Visit: Payer: Self-pay

## 2019-04-15 DIAGNOSIS — I2510 Atherosclerotic heart disease of native coronary artery without angina pectoris: Secondary | ICD-10-CM | POA: Diagnosis not present

## 2019-04-15 DIAGNOSIS — I77811 Abdominal aortic ectasia: Secondary | ICD-10-CM | POA: Insufficient documentation

## 2019-04-15 DIAGNOSIS — J439 Emphysema, unspecified: Secondary | ICD-10-CM | POA: Diagnosis not present

## 2019-04-15 DIAGNOSIS — N400 Enlarged prostate without lower urinary tract symptoms: Secondary | ICD-10-CM | POA: Insufficient documentation

## 2019-04-15 DIAGNOSIS — C3492 Malignant neoplasm of unspecified part of left bronchus or lung: Secondary | ICD-10-CM

## 2019-04-15 DIAGNOSIS — I700 Atherosclerosis of aorta: Secondary | ICD-10-CM | POA: Diagnosis not present

## 2019-04-15 DIAGNOSIS — R911 Solitary pulmonary nodule: Secondary | ICD-10-CM | POA: Insufficient documentation

## 2019-04-15 DIAGNOSIS — K5730 Diverticulosis of large intestine without perforation or abscess without bleeding: Secondary | ICD-10-CM | POA: Diagnosis not present

## 2019-04-15 LAB — GLUCOSE, CAPILLARY: Glucose-Capillary: 99 mg/dL (ref 70–99)

## 2019-04-15 MED ORDER — FLUDEOXYGLUCOSE F - 18 (FDG) INJECTION
11.7000 | Freq: Once | INTRAVENOUS | Status: AC | PRN
  Administered 2019-04-15: 12.58 via INTRAVENOUS

## 2019-04-16 ENCOUNTER — Other Ambulatory Visit: Payer: Self-pay

## 2019-04-16 NOTE — Progress Notes (Signed)
Patient pre screened for office appointment, no questions or concerns today. Patient reminded of upcoming appointment time and date. Patient HOH wife supplied information.

## 2019-04-17 ENCOUNTER — Inpatient Hospital Stay: Payer: No Typology Code available for payment source | Attending: Oncology | Admitting: Oncology

## 2019-04-17 ENCOUNTER — Other Ambulatory Visit: Payer: Self-pay

## 2019-04-17 ENCOUNTER — Inpatient Hospital Stay: Payer: No Typology Code available for payment source

## 2019-04-17 ENCOUNTER — Ambulatory Visit
Admission: RE | Admit: 2019-04-17 | Discharge: 2019-04-17 | Disposition: A | Discharge status: Home / Self Care | Payer: No Typology Code available for payment source | Source: Ambulatory Visit | Attending: Radiation Oncology | Admitting: Radiation Oncology

## 2019-04-17 DIAGNOSIS — Z923 Personal history of irradiation: Secondary | ICD-10-CM | POA: Insufficient documentation

## 2019-04-17 DIAGNOSIS — C3412 Malignant neoplasm of upper lobe, left bronchus or lung: Secondary | ICD-10-CM | POA: Insufficient documentation

## 2019-04-17 DIAGNOSIS — C3492 Malignant neoplasm of unspecified part of left bronchus or lung: Secondary | ICD-10-CM | POA: Diagnosis not present

## 2019-04-17 DIAGNOSIS — Z9221 Personal history of antineoplastic chemotherapy: Secondary | ICD-10-CM | POA: Diagnosis not present

## 2019-04-17 LAB — COMPREHENSIVE METABOLIC PANEL
ALT: 22 U/L (ref 0–44)
AST: 21 U/L (ref 15–41)
Albumin: 3.8 g/dL (ref 3.5–5.0)
Alkaline Phosphatase: 62 U/L (ref 38–126)
Anion gap: 9 (ref 5–15)
BUN: 15 mg/dL (ref 8–23)
CO2: 25 mmol/L (ref 22–32)
Calcium: 9.2 mg/dL (ref 8.9–10.3)
Chloride: 106 mmol/L (ref 98–111)
Creatinine, Ser: 1.22 mg/dL (ref 0.61–1.24)
GFR calc Af Amer: >60 mL/min (ref >60)
GFR calc non Af Amer: 57 mL/min — ABNORMAL LOW (ref >60)
Glucose, Bld: 116 mg/dL — ABNORMAL HIGH (ref 70–99)
Potassium: 4.4 mmol/L (ref 3.5–5.1)
Sodium: 140 mmol/L (ref 135–145)
Total Bilirubin: 0.7 mg/dL (ref 0.3–1.2)
Total Protein: 6.8 g/dL (ref 6.5–8.1)

## 2019-04-17 LAB — CBC WITH DIFFERENTIAL/PLATELET
Abs Immature Granulocytes: 0.03 10*3/uL (ref 0.00–0.07)
Basophils Absolute: 0 10*3/uL (ref 0.0–0.1)
Basophils Relative: 1 %
Eosinophils Absolute: 0.1 10*3/uL (ref 0.0–0.5)
Eosinophils Relative: 2 %
HCT: 45.7 % (ref 39.0–52.0)
Hemoglobin: 14.8 g/dL (ref 13.0–17.0)
Immature Granulocytes: 1 %
Lymphocytes Relative: 16 %
Lymphs Abs: 0.9 10*3/uL (ref 0.7–4.0)
MCH: 32.7 pg (ref 26.0–34.0)
MCHC: 32.4 g/dL (ref 30.0–36.0)
MCV: 100.9 fL — ABNORMAL HIGH (ref 80.0–100.0)
Monocytes Absolute: 0.6 10*3/uL (ref 0.1–1.0)
Monocytes Relative: 11 %
Neutro Abs: 3.9 10*3/uL (ref 1.7–7.7)
Neutrophils Relative %: 69 %
Platelets: 158 10*3/uL (ref 150–400)
RBC: 4.53 MIL/uL (ref 4.22–5.81)
RDW: 12.6 % (ref 11.5–15.5)
WBC: 5.6 10*3/uL (ref 4.0–10.5)
nRBC: 0 % (ref 0.0–0.2)

## 2019-04-17 NOTE — Progress Notes (Signed)
Radiation Oncology Follow up Note  Name: Levi [name]   Date:   04/17/2019 MRN:  [id number] DOB: 09/06/1940    This 78 y.o. 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  There is some mild residual hypermetabolic activity in the left suprahilar lymph node and tiny right upper lobe pulmonary nodule is decreased in size and has no residual uptake.  COMPLICATIONS OF TREATMENT: none  FOLLOW UP COMPLIANCE: keeps appointments   PHYSICAL EXAM:  There were no vitals taken for this visit. Well-developed well-nourished patient in NAD. HEENT reveals PERLA, EOMI, discs not visualized.  Oral cavity is clear. No oral mucosal lesions are identified. Neck is clear without evidence of cervical or supraclavicular adenopathy. Lungs are clear to A&P. Cardiac examination is essentially unremarkable with regular rate and rhythm without murmur rub or thrill. Abdomen is benign with no organomegaly or masses noted. Motor sensory and DTR levels are equal and symmetric in the upper and lower extremities. Cranial nerves II through XII are grossly intact. Proprioception is intact. No peripheral adenopathy or edema is identified. No motor or sensory levels are noted. Crude visual fields are within normal range.  RADIOLOGY  RESULTS: PET CT scan reviewed compatible with above-stated findings  PLAN: Present time patient is doing well is an excellent response by PET/CT criteria.  I am pleased with his overall progress.  I have asked to see him back in 6 months for follow-up.  Patient knows to call with any concerns at any time.  He continues close follow-up care by medical oncology for multiple comorbidities.  I would like to take this opportunity to thank you for allowing me to participate in the care of your patient.Noreene Filbert, MD

## 2019-05-02 ENCOUNTER — Other Ambulatory Visit: Payer: Non-veteran care

## 2019-05-15 ENCOUNTER — Telehealth: Payer: Self-pay

## 2019-05-15 NOTE — Telephone Encounter (Signed)
Telephone call to patient and spoke to wife to discuss SCP visit.   Patient in agreement for me to mail him the Levi [name] packet and I will call him [name]. 28th around 2:00 to review packet and discuss post cancer care.   Packet mailed.

## 2019-05-27 ENCOUNTER — Ambulatory Visit: Payer: No Typology Code available for payment source | Admitting: Radiation Oncology

## 2019-05-27 DIAGNOSIS — C3492 Malignant neoplasm of unspecified part of left bronchus or lung: Secondary | ICD-10-CM

## 2019-05-28 ENCOUNTER — Inpatient Hospital Stay: Payer: No Typology Code available for payment source | Attending: Nurse Practitioner | Admitting: Oncology

## 2019-05-28 DIAGNOSIS — C3492 Malignant neoplasm of unspecified part of left bronchus or lung: Secondary | ICD-10-CM | POA: Diagnosis not present

## 2019-05-28 NOTE — Progress Notes (Signed)
Virtual Visit Progress Note  Survivorship Clinic Consult note [facility]  [phone] [phone]  Patient Care Team: Blane Ohara, MD as PCP - General (Family Medicine) Kate Sable, MD as PCP - Cardiology (Cardiology) Telford Nab, RN as Registered Nurse Grayland Ormond, Kathlene November, MD as Consulting Physician (Oncology) Noreene Filbert, MD as Referring Physician (Radiation Oncology)   Name of the patient: Levi [name]  [id number]  Oct 07, 1940   Date of visit: 05/28/19  CLINIC:  Survivorship  I connected with [name] on 05/28/19 at  2:00 PM EST by telephone visit and verified that I am speaking with the correct person using two identifiers.   I discussed the limitations, risks, security and privacy concerns of performing an evaluation and management service by telemedicine and the availability of in-person appointments. I also discussed with the patient that there may be a patient responsible charge related to this service. The patient expressed understanding and agreed to proceed.   Other persons participating in the visit and their role in the encounter: Magdalene Patricia, RN (review Survivorship Care Plan)  Patient's location: home Provider's location: clinic  Chief Complaint: Review Survivorship Care Plan and address acute survivorship needs  REASON FOR VISIT:  Survivorship Care Plan visit & to address acute survivorship needs   BRIEF ONCOLOGY HISTORY: Oncology History  Squamous cell carcinoma lung, left ([place])  10/27/2018 Initial Diagnosis   Squamous cell carcinoma lung, left ([place])   10/27/2018 Cancer Staging   Staging form: Lung, AJCC 8th Edition - Clinical stage from 10/27/2018: Stage IIB (cT1c, cN1, cM0) - Signed by Lloyd Huger, MD on 11/16/2018   11/27/2018 -  Chemotherapy   The patient had palonosetron (ALOXI) injection 0.25 mg, 0.25 mg, Intravenous,  Once, 7 of 8 cycles Administration: 0.25 mg (11/27/2018),  0.25 mg (12/04/2018), 0.25 mg (12/25/2018), 0.25 mg (01/01/2019), 0.25 mg (01/09/2019), 0.25 mg (12/11/2018), 0.25 mg (12/19/2018) CARBOplatin (PARAPLATIN) 190 mg in sodium chloride 0.9 % 100 mL chemo infusion, 190 mg (101 % of original dose 188 mg), Intravenous,  Once, 7 of 8 cycles Dose modification:   (original dose 188 mg, Cycle 1) Administration: 190 mg (11/27/2018), 170 mg (12/04/2018), 170 mg (12/25/2018), 170 mg (01/01/2019), 170 mg (01/09/2019), 170 mg (12/11/2018), 170 mg (12/19/2018) PACLitaxel (TAXOL) 108 mg in sodium chloride 0.9 % 250 mL chemo infusion (</= 22m/m2), 45 mg/m2 = 108 mg, Intravenous,  Once, 7 of 8 cycles Administration: 108 mg (11/27/2018), 108 mg (12/04/2018), 108 mg (12/25/2018), 108 mg (01/01/2019), 108 mg (01/09/2019), 108 mg (12/11/2018), 108 mg (12/19/2018)  for chemotherapy treatment.       INTERVAL HISTORY: Patient presents to the survivorship clinic today for initial meeting to review survivorship care plan detailing treatment course for lung cancer, as well as monitoring long-term side effects of that treatment, education regarding health maintenance, screening, and overall wellness and health promotion.  Overall, he reports feeling okay since completing treatment.  Mr. HBasherwas hospitalized on 03/30/2019 for a near syncopal episode where he was found to be in A. fib.  Imaging revealed an mildly enlarged heart with normal pulmonary vascularity.  EKG revealed A. fib with a controlled rate.  Labs were unremarkable.  He met with cardiology who recommended anticoagulation with Eliquis.  He was scheduled to follow-up with cardiology outpatient.  [facility]  His appointment was canceled and rescheduled for March.  Today, he complains of persistent dizziness mostly with position changes.  He is scared to drive because he is uncertain if he will have another syncopal episode.  He is unclear if dizziness is related to his cancer  history or to his heart.  We discussed his past medical, surgical, social history were reviewed and updated as below.  We also updated patient's current medications and allergies.   ADDITIONAL REVIEW OF SYSTEMS:  Review of Systems  Constitutional: Positive for malaise/fatigue. Negative for chills, fever and weight loss.  HENT: Positive for sinus pain. Negative for congestion, ear pain and tinnitus.   Eyes: Negative.  Negative for blurred vision and double vision.  Respiratory: Negative.  Negative for cough, sputum production and shortness of breath.   Cardiovascular: Negative.  Negative for chest pain, palpitations and leg swelling.  Gastrointestinal: Negative.  Negative for abdominal pain, constipation, diarrhea, nausea and vomiting.  Genitourinary: Negative for dysuria, frequency and urgency.  Musculoskeletal: Positive for falls. Negative for back pain.  Skin: Negative.  Negative for rash.  Neurological: Positive for dizziness. Negative for weakness and headaches.  Endo/Heme/Allergies: Negative.  Does not bruise/bleed easily.  Psychiatric/Behavioral: Negative.  Negative for depression. The patient is not nervous/anxious and does not have insomnia.     PAST MEDICAL & SURGICAL HISTORY:  Past Medical History:  Diagnosis Date  . Asthma   . Cancer ([place])   . COPD (chronic obstructive pulmonary disease) ([place])   . Hearing loss   . Hypothyroidism   . Mass of left lung   . 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 Prostate enlargement   . Shortness of breath   . Thyroid disease   . Tobacco abuse    Past Surgical History:  Procedure Laterality Date  . ELECTROMAGNETIC NAVIGATION BROCHOSCOPY Left 10/19/2018   Procedure: ELECTROMAGNETIC NAVIGATION BRONCHOSCOPY LEFT;  Surgeon: Tyler Pita, MD;  Location: [facility] ORS;  Service: Cardiopulmonary;  Laterality: Left;  . ENDOBRONCHIAL ULTRASOUND Left 10/19/2018   Procedure: ENDOBRONCHIAL ULTRASOUND LEFT;  Surgeon: Tyler Pita, MD;  Location:  [facility] ORS;  Service: Cardiopulmonary;  Laterality: Left;  Marland Kitchen MELANOMA EXCISION Left   . NO PAST SURGERIES      SOCIAL HISTORY:   CURRENT MEDICATIONS:  Current Outpatient Medications on File Prior to Visit  Medication Sig Dispense Refill  . albuterol (PROVENTIL HFA;VENTOLIN HFA) 108 (90 Base) MCG/ACT inhaler Inhale 2 puffs into the lungs every 6 (six) hours as needed for wheezing or shortness of breath. 1 Inhaler 0  . apixaban (ELIQUIS) 5 MG TABS tablet Take 1 tablet (5 mg total) by mouth 2 (two) times daily. 180 tablet 3  . budesonide-formoterol (SYMBICORT) 160-4.5 MCG/ACT inhaler Inhale 2 puffs into the lungs 2 (two) times daily.    Marland Kitchen levothyroxine (SYNTHROID) 137 MCG tablet Take 137 mcg by mouth daily before breakfast.    . tamsulosin (FLOMAX) 0.4 MG CAPS capsule Take 0.4 mg by mouth daily.     . vitamin B-12 (CYANOCOBALAMIN) 1000 MCG tablet Take 1,000 mcg by mouth daily.    . Vitamin D, Ergocalciferol, (DRISDOL) 1.25 MG (50000 UT) CAPS capsule Take 50,000 Units by mouth every 7 (seven) days.     No current facility-administered medications on file prior to visit.    ALLERGIES: No Known Allergies   PHYSICAL EXAM:  There were no vitals filed for this visit. There were no vitals filed for this visit.  Exam limited due to telemedicine  General: well appearing patient in in no acute distress.  Respiratory: Breathing non-labored.    Neuro: Alert and oriented Psych:  Normal mood and affect for situation.  LABORATORY DATA:  Lab Results  Component Value Date   WBC 5.6 04/17/2019   HGB 14.8 04/17/2019   HCT 45.7 04/17/2019   MCV 100.9 (H) 04/17/2019   PLT 158 04/17/2019     Chemistry      Component Value Date/Time   NA 140 04/17/2019 0944   K 4.4 04/17/2019 0944   CL 106 04/17/2019 0944   CO2 25 04/17/2019 0944   BUN 15 04/17/2019 0944   CREATININE 1.22 04/17/2019 0944   CREATININE 1.29 10/31/2014 1159      Component Value Date/Time   CALCIUM 9.2 04/17/2019 0944    ALKPHOS 62 04/17/2019 0944   AST 21 04/17/2019 0944   ALT 22 04/17/2019 0944   BILITOT 0.7 04/17/2019 0944     DIAGNOSTIC IMAGING:  Pet Scan 12/414/21  IMPRESSION: 1. Interval response to therapy since 10/08/2018 PET-CT. Left upper lobe pulmonary nodule is newly cavitary, decreased in size and significantly decreased in metabolism. Mild residual hypermetabolism within the left suprahilar lymph node, significantly decreased. Tiny right upper lobe pulmonary nodule is decreased in size and demonstrates no significant residual uptake. 2. No new or progressive hypermetabolic metastatic disease. 3. Chronic findings include: Aortic Atherosclerosis (ICD10-I70.0) and Emphysema (ICD10-J43.9). Coronary atherosclerosis. 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 Moderate prostatomegaly. Moderate left colonic diverticulosis.  ASSESSMENT & PLAN:  Mr. Holland is a pleasant 79 y.o. male/male with history of Stage IIa non small cell lung cancer, treated with concurrent chemoradiation; completed treatment on 01/09/2019.  Patient presents to survivorship clinic today for survivorship care plan visit and to address any acute survivorship concerns since completing treatment.   1. Stage IIa lung cancer: Today, he received a copy of his [facility]) document, which was reviewed with him in detail.  The SCP details his cancer treatment history and potential late/long-term side effects of those treatments.  We discussed the follow-up schedule he can anticipate with interval imaging for surveillance of his cancer.  I have also shared a copy of his treatment summary/SCP with his PCP.  We discussed the NCCN guidelines for surveillance after completion of definitive therapy.  We discussed that these guidelines are followed once there is no evidence of clinical or radiographic disease. NCCN guidelines recommend history and physical and chest CT +/- contrast every 3-6 months for 3 years then history and  physical and chest CT +/-contrast every 6 months for 2 years then history and physical and low-dose noncontrast enhanced chest CT annually thereafter.  I advised that patients with residual or new radiographic abnormalities may require more frequent imaging  We discussed that if findings suggestive of recurrence PET/CT and/or brain MRI with contrast may be indicated.  I discussed that PET/CT is generally not warranted for routine surveillance however, many benign conditions such as atelectasis, consolidation, and radiation fibrosis are difficult to differentiate from neoplasm on standard CT imaging and therefore PET CT may be needed to differentiate true malignancy in the settings.  I did advise that areas previously treated with radiation therapy can remain FDG avid for up to 2 years and if findings concerning for recurrent or progressive disease histopathologic confirmation is recommended.  #. Problem at visit: Dizziness: Orthostasis.   I recommend he follow-up with cardiology as recommended from the emergency department.  I offered him an appointment in our symptom management clinic for further evaluation given his cardiology appointment was not scheduled for several more months but he declined and stated he had an  appt this week with his PCP. He stated he would mention this to his PCP.   #. Smoking cessation: I commended continued efforts to remain tobacco-free.  We discussed that one of the most important risk reduction strategies in preventing cancer recurrence in lung cancer patients is smoking cessation.  Patient is committed to abstaining from tobacco.    #. Physical activity/Healthy eating: Getting adequate physical activity and maintaining a healthy diet as a cancer survivor is important for overall wellness and reduces the risk of cancer recurrence. We discussed the CARE program, which is a fitness program that is offered to cancer survivors free of charge.  We also reviewed "The Nutrition  Rainbow" handout, as well as the American Cancer Society's booklet with recommendations for nutrition and physical activity.    #. Health promotion/Cancer screening:  Mr. Applegate is reportedly up-to-date on his cancer screening tests and vaccinations. I encouraged him to talk with PCP about arranging appropriate cancer screening tests, as appropriate.   #. Support services/Counseling: It is not uncommon for this period of the patient's cancer care trajectory to be one of many emotions and stressors.  Mr. Simonetti was encouraged to take advantage of our many support services programs, support groups, and/or counseling in coping with her new life as a cancer survivor after completing anti-cancer treatment. We specifically discussed the Cancer Transitions program. He was given a calendar of the cancer center's support services events, as well as brochures for our spiritual care and free counseling resources.    Disposition/Return to clinic  -Schedule an appt in [facility] if needed after PCP eval on Thursday. -Follow-up with cardiology has need for Afib. -Return to survivorship clinic as needed; no additional follow-up needed at this time.    I discussed the assessment and treatment plan with the patient. The patient was provided an opportunity to ask questions and all were answered. The patient agreed with the plan and demonstrated an understanding of the instructions.   The patient was advised to call back or seek an in-person evaluation if the symptoms worsen or if the condition fails to improve as anticipated.   I provided 15 minutes of non face-to-face telephone visit time during this encounter, and > 50% was spent counseling as documented under my assessment & plan.  A total of 15 minutes was spent in face-to-face care of this patient, with greater than 50% of that time spent in counseling and care coordination.   Rulon Abide NP, AGNP-C [place] at [facility] [phone] (clinic)  CC: Dr. Grayland Ormond

## 2019-05-28 NOTE — Progress Notes (Signed)
Survivorship Care Plan visit completed.  Treatment summary reviewed and previously mailed to patient.  ASCO answers booklet reviewed and given to patient.  CARE program and Cancer Transitions discussed with patient along with other resources cancer center offers to patients and caregivers.  Patient verbalized understanding.

## 2019-05-30 ENCOUNTER — Inpatient Hospital Stay: Payer: No Typology Code available for payment source | Admitting: Oncology

## 2019-06-04 ENCOUNTER — Ambulatory Visit: Payer: No Typology Code available for payment source | Admitting: Cardiology

## 2019-07-11 NOTE — Progress Notes (Deleted)
[facility]  [phone] Fax:(336) [phone]  ID: Levi [name] OB: 1940-09-20  MR#: [id number]  PXT#:[id number]  Patient Care Team: Blane Ohara, MD as PCP - General (Family Medicine) Kate Sable, MD as PCP - Cardiology (Cardiology) Telford Nab, RN as Registered Nurse Grayland Ormond, Kathlene November, MD as Consulting Physician (Oncology) Noreene Filbert, MD as Referring Physician (Radiation Oncology)  CHIEF COMPLAINT: Clinical stage IIB squamous cell carcinoma, left upper lobe lung.  INTERVAL HISTORY: Patient returns to clinic today for further evaluation and discussion of his imaging results.  He continues to have a depressed affect, but otherwise feels well.  He has no neurologic complaints.  He denies any recent fevers or illnesses.  He does not complain of any cough or shortness of breath today.  He denies any hemoptysis or chest pain.  He has a good appetite and denies weight loss.  He denies any nausea, vomiting, constipation, or diarrhea.  He has no urinary complaints.  Patient offers no further specific complaints today.  REVIEW OF SYSTEMS:   Review of Systems  Constitutional: Negative.  Negative for fever, malaise/fatigue and weight loss.  HENT: Negative.  Negative for congestion.   Respiratory: Negative.  Negative for cough, hemoptysis and shortness of breath.   Cardiovascular: Negative.  Negative for chest pain and leg swelling.  Gastrointestinal: Negative.  Negative for abdominal pain and nausea.  Genitourinary: Negative.  Negative for dysuria.  Musculoskeletal: Negative.  Negative for back pain.  Skin: Negative for rash.  Neurological: Negative for dizziness, focal weakness, weakness and headaches.  Endo/Heme/Allergies: Does not bruise/bleed easily.  Psychiatric/Behavioral: Positive for depression. The patient is not nervous/anxious.     As per HPI. Otherwise, a complete review of systems is negative.  PAST MEDICAL HISTORY: Past Medical  History:  Diagnosis Date  . Asthma   . Cancer ([place])   . COPD (chronic obstructive pulmonary disease) ([place])   . Hearing loss   . Hypothyroidism   . Mass of left lung   . 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 Prostate enlargement   . Shortness of breath   . Thyroid disease   . Tobacco abuse     PAST SURGICAL HISTORY: Past Surgical History:  Procedure Laterality Date  . ELECTROMAGNETIC NAVIGATION BROCHOSCOPY Left 10/19/2018   Procedure: ELECTROMAGNETIC NAVIGATION BRONCHOSCOPY LEFT;  Surgeon: Tyler Pita, MD;  Location: [facility] ORS;  Service: Cardiopulmonary;  Laterality: Left;  . ENDOBRONCHIAL ULTRASOUND Left 10/19/2018   Procedure: ENDOBRONCHIAL ULTRASOUND LEFT;  Surgeon: Tyler Pita, MD;  Location: [facility] ORS;  Service: Cardiopulmonary;  Laterality: Left;  Marland Kitchen MELANOMA EXCISION Left   . NO PAST SURGERIES      FAMILY HISTORY: Family History  Problem Relation Age of Onset  . Aneurysm Mother   . Cancer Father     ADVANCED DIRECTIVES (Y/N):  N  HEALTH MAINTENANCE: Social History   Tobacco Use  . Smoking status: Former Smoker    Packs/day: 0.25    Years: 62.00    Pack years: 15.50    Types: Cigarettes    Quit date: 09/05/2018    Years since quitting: 0.8  . Smokeless tobacco: Never Used  Substance Use Topics  . Alcohol use: No    Alcohol/week: 0.0 standard drinks  . Drug use: No     Colonoscopy:  PAP:  Bone density:  Lipid panel:  No Known Allergies  Current Outpatient Medications  Medication Sig Dispense Refill  . albuterol (PROVENTIL HFA;VENTOLIN HFA) 108 (90 Base) MCG/ACT  inhaler Inhale 2 puffs into the lungs every 6 (six) hours as needed for wheezing or shortness of breath. 1 Inhaler 0  . apixaban (ELIQUIS) 5 MG TABS tablet Take 1 tablet (5 mg total) by mouth 2 (two) times daily. 180 tablet 3  . budesonide-formoterol (SYMBICORT) 160-4.5 MCG/ACT inhaler Inhale 2 puffs into the lungs 2 (two) times daily.    Marland Kitchen levothyroxine (SYNTHROID) 137 MCG tablet Take  137 mcg by mouth daily before breakfast.    . tamsulosin (FLOMAX) 0.4 MG CAPS capsule Take 0.4 mg by mouth daily.     . vitamin B-12 (CYANOCOBALAMIN) 1000 MCG tablet Take 1,000 mcg by mouth daily.    . Vitamin D, Ergocalciferol, (DRISDOL) 1.25 MG (50000 UT) CAPS capsule Take 50,000 Units by mouth every 7 (seven) days.     No current facility-administered medications for this visit.    OBJECTIVE: There were no vitals filed for this visit.   There is no height or weight on file to calculate BMI.    ECOG FS:0 - Asymptomatic  General: Well-developed, well-nourished, no acute distress. Eyes: Pink conjunctiva, anicteric sclera. HEENT: Normocephalic, moist mucous membranes. Lungs: No audible wheezing or coughing. Heart: Regular rate and rhythm. Abdomen: Soft, nontender, no obvious distention. Musculoskeletal: No edema, cyanosis, or clubbing. Neuro: Alert, answering all questions appropriately. Cranial nerves grossly intact. Skin: No rashes or petechiae noted. Psych: Flat affect.   LAB RESULTS:  Lab Results  Component Value Date   NA 140 04/17/2019   K 4.4 04/17/2019   CL 106 04/17/2019   CO2 25 04/17/2019   GLUCOSE 116 (H) 04/17/2019   BUN 15 04/17/2019   CREATININE 1.22 04/17/2019   CALCIUM 9.2 04/17/2019   PROT 6.8 04/17/2019   ALBUMIN 3.8 04/17/2019   AST 21 04/17/2019   ALT 22 04/17/2019   ALKPHOS 62 04/17/2019   BILITOT 0.7 04/17/2019   GFRNONAA 57 (L) 04/17/2019   GFRAA >60 04/17/2019    Lab Results  Component Value Date   WBC 5.6 04/17/2019   NEUTROABS 3.9 04/17/2019   HGB 14.8 04/17/2019   HCT 45.7 04/17/2019   MCV 100.9 (H) 04/17/2019   PLT 158 04/17/2019     STUDIES: No results found.  ASSESSMENT: Clinical stage IIB squamous cell carcinoma, left upper lobe lung.  PLAN:    1.Clinical stage IIB squamous cell carcinoma, left upper lobe lung: PET scan results from October 08, 2018 reviewed independently.  Patient underwent biopsy with navigational  bronchoscopy on October 19, 2018 confirming diagnosis.  Patient declined surgery and wished to pursue XRT along with concurrent chemotherapy.  Given his stage of disease he does not qualify for maintenance immunotherapy.  He completed cycle 7 of weekly carboplatinum and Taxol on January 09, 2019 and then completed XRT on January 14, 2019.  PET scan results from April 15, 2019 reviewed independently and reported as above with no obvious evidence of recurrent or progressive disease.  No intervention is needed at that time.  Return to clinic in 3 months with repeat imaging using CT scan and further evaluation.   2.  History of melanoma: Unclear stage or depth, although by report was in situ.  Patient had Mohs surgery in fall 2019.  Lung biopsy consistent with squamous cell carcinoma. 3.  Anxiety/depression: Continue current medications as prescribed.  Continue follow-up with primary care physician as well as psychiatry at the [place]. 4.  Renal insufficiency: Resolved. 5.  Hypotension: Resolved. 6.  Nausea: Patient does not complain of this today.  Continue  antiemetics as needed.   7.  Depression: Continue current medications as prescribed.  Psychiatry referral as above. 8.  Shortness of breath/cough: Patient does not complain of this today.  Continue follow-up and treatment per pulmonary.  Patient expressed understanding and was in agreement with this plan. He also understands that He can call clinic at any time with any questions, concerns, or complaints.   Cancer Staging Squamous cell carcinoma lung, left ([place]) Staging form: Lung, AJCC 8th Edition - Clinical stage from 10/27/2018: Stage IIB (cT1c, cN1, cM0) - Signed by Lloyd Huger, MD on 11/16/2018   Lloyd Huger, MD   07/11/2019 5:45 PM

## 2019-07-16 ENCOUNTER — Ambulatory Visit: Payer: No Typology Code available for payment source

## 2019-07-16 ENCOUNTER — Inpatient Hospital Stay: Payer: No Typology Code available for payment source

## 2019-07-17 ENCOUNTER — Inpatient Hospital Stay: Payer: No Typology Code available for payment source | Admitting: Oncology

## 2019-07-18 ENCOUNTER — Ambulatory Visit
Admission: RE | Admit: 2019-07-18 | Discharge: 2019-07-18 | Disposition: A | Discharge status: Home / Self Care | Payer: No Typology Code available for payment source | Source: Ambulatory Visit | Attending: Oncology | Admitting: Oncology

## 2019-07-18 ENCOUNTER — Inpatient Hospital Stay: Payer: No Typology Code available for payment source | Attending: Oncology

## 2019-07-18 ENCOUNTER — Other Ambulatory Visit: Payer: Self-pay

## 2019-07-18 ENCOUNTER — Encounter: Payer: Self-pay | Admitting: Oncology

## 2019-07-18 DIAGNOSIS — Z85118 Personal history of other malignant neoplasm of bronchus and lung: Secondary | ICD-10-CM | POA: Insufficient documentation

## 2019-07-18 DIAGNOSIS — C3492 Malignant neoplasm of unspecified part of left bronchus or lung: Secondary | ICD-10-CM

## 2019-07-18 DIAGNOSIS — N289 Disorder of kidney and ureter, unspecified: Secondary | ICD-10-CM | POA: Insufficient documentation

## 2019-07-18 DIAGNOSIS — Z85820 Personal history of malignant melanoma of skin: Secondary | ICD-10-CM | POA: Diagnosis not present

## 2019-07-18 DIAGNOSIS — Z9221 Personal history of antineoplastic chemotherapy: Secondary | ICD-10-CM | POA: Insufficient documentation

## 2019-07-18 DIAGNOSIS — Z809 Family history of malignant neoplasm, unspecified: Secondary | ICD-10-CM | POA: Insufficient documentation

## 2019-07-18 DIAGNOSIS — F419 Anxiety disorder, unspecified: Secondary | ICD-10-CM | POA: Insufficient documentation

## 2019-07-18 DIAGNOSIS — Z923 Personal history of irradiation: Secondary | ICD-10-CM | POA: Diagnosis not present

## 2019-07-18 DIAGNOSIS — F329 Major depressive disorder, single episode, unspecified: Secondary | ICD-10-CM | POA: Diagnosis not present

## 2019-07-18 DIAGNOSIS — Z87891 Personal history of nicotine dependence: Secondary | ICD-10-CM | POA: Diagnosis not present

## 2019-07-18 HISTORY — DX: Malignant neoplasm of unspecified part of left bronchus or lung: C34.92

## 2019-07-18 LAB — CBC WITH DIFFERENTIAL/PLATELET
Abs Immature Granulocytes: 0.03 10*3/uL (ref 0.00–0.07)
Basophils Absolute: 0 10*3/uL (ref 0.0–0.1)
Basophils Relative: 1 %
Eosinophils Absolute: 0.1 10*3/uL (ref 0.0–0.5)
Eosinophils Relative: 2 %
HCT: 44.1 % (ref 39.0–52.0)
Hemoglobin: 14.6 g/dL (ref 13.0–17.0)
Immature Granulocytes: 1 %
Lymphocytes Relative: 21 %
Lymphs Abs: 1.2 10*3/uL (ref 0.7–4.0)
MCH: 31 pg (ref 26.0–34.0)
MCHC: 33.1 g/dL (ref 30.0–36.0)
MCV: 93.6 fL (ref 80.0–100.0)
Monocytes Absolute: 0.9 10*3/uL (ref 0.1–1.0)
Monocytes Relative: 16 %
Neutro Abs: 3.5 10*3/uL (ref 1.7–7.7)
Neutrophils Relative %: 59 %
Platelets: 162 10*3/uL (ref 150–400)
RBC: 4.71 MIL/uL (ref 4.22–5.81)
RDW: 13.3 % (ref 11.5–15.5)
WBC: 5.8 10*3/uL (ref 4.0–10.5)
nRBC: 0 % (ref 0.0–0.2)

## 2019-07-18 LAB — COMPREHENSIVE METABOLIC PANEL
ALT: 35 U/L (ref 0–44)
AST: 29 U/L (ref 15–41)
Albumin: 3.7 g/dL (ref 3.5–5.0)
Alkaline Phosphatase: 51 U/L (ref 38–126)
Anion gap: 8 (ref 5–15)
BUN: 19 mg/dL (ref 8–23)
CO2: 23 mmol/L (ref 22–32)
Calcium: 8.4 mg/dL — ABNORMAL LOW (ref 8.9–10.3)
Chloride: 108 mmol/L (ref 98–111)
Creatinine, Ser: 1.36 mg/dL — ABNORMAL HIGH (ref 0.61–1.24)
GFR calc Af Amer: 58 mL/min — ABNORMAL LOW (ref >60)
GFR calc non Af Amer: 50 mL/min — ABNORMAL LOW (ref >60)
Glucose, Bld: 151 mg/dL — ABNORMAL HIGH (ref 70–99)
Potassium: 4.2 mmol/L (ref 3.5–5.1)
Sodium: 139 mmol/L (ref 135–145)
Total Bilirubin: 0.8 mg/dL (ref 0.3–1.2)
Total Protein: 6.4 g/dL — ABNORMAL LOW (ref 6.5–8.1)

## 2019-07-18 MED ORDER — IOHEXOL 300 MG/ML  SOLN
75.0000 mL | Freq: Once | INTRAMUSCULAR | Status: AC | PRN
  Administered 2019-07-18: 75 mL via INTRAVENOUS

## 2019-07-18 NOTE — Progress Notes (Signed)
Patient prescreened for appointment. Patient has no concerns or questions.  

## 2019-07-19 ENCOUNTER — Inpatient Hospital Stay (HOSPITAL_BASED_OUTPATIENT_CLINIC_OR_DEPARTMENT_OTHER): Payer: No Typology Code available for payment source | Admitting: Oncology

## 2019-07-19 VITALS — BP 150/84 | HR 82 | Temp 98.0°F | Resp 18 | Wt 256.2 lb

## 2019-07-19 DIAGNOSIS — C3492 Malignant neoplasm of unspecified part of left bronchus or lung: Secondary | ICD-10-CM

## 2019-07-19 DIAGNOSIS — Z85118 Personal history of other malignant neoplasm of bronchus and lung: Secondary | ICD-10-CM | POA: Diagnosis not present

## 2019-07-19 NOTE — Progress Notes (Signed)
[place]  [phone] Fax:(336) [phone]  ID: [name] OB: [birth year]  MR#: [id number]  ZWC#:[id number]  Patient Care Team: Blane Ohara, MD as PCP - General (Family Medicine) Kate Sable, MD as PCP - Cardiology (Cardiology) Telford Nab, RN as Registered Nurse Grayland Ormond, Kathlene November, MD as Consulting Physician (Oncology) Noreene Filbert, MD as Referring Physician (Radiation Oncology)  CHIEF COMPLAINT: Clinical stage IIB squamous cell carcinoma, left upper lobe lung.  INTERVAL HISTORY: Patient returns to clinic today for further evaluation and discussion of his imaging results.  He continues to be anxious, but has an improved mood.  He otherwise feels well and is asymptomatic.  He has no neurologic complaints.  He denies any recent fevers or illnesses.  He does not complain of any cough or shortness of breath today.  He denies any hemoptysis or chest pain.  He has a good appetite and denies weight loss.  He denies any nausea, vomiting, constipation, or diarrhea.  He has no urinary complaints.  Patient offers no further specific complaints today.  REVIEW OF SYSTEMS:   Review of Systems  Constitutional: Negative.  Negative for fever, malaise/fatigue and weight loss.  HENT: Negative.  Negative for congestion.   Respiratory: Negative.  Negative for cough, hemoptysis and shortness of breath.   Cardiovascular: Negative.  Negative for chest pain and leg swelling.  Gastrointestinal: Negative.  Negative for abdominal pain and nausea.  Genitourinary: Negative.  Negative for dysuria.  Musculoskeletal: Negative.  Negative for back pain.  Skin: Negative for rash.  Neurological: Negative for dizziness, focal weakness, weakness and headaches.  Endo/Heme/Allergies: Does not bruise/bleed easily.  Psychiatric/Behavioral: Positive for depression. The patient is nervous/anxious.     As per HPI. Otherwise, a complete review of systems is negative.  PAST  MEDICAL HISTORY: Past Medical History:  Diagnosis Date  . Asthma   . COPD (chronic obstructive pulmonary disease) ([place])   . Hearing loss   . Hypothyroidism   . Mass of left lung   . 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 Prostate enlargement   . Shortness of breath   . Squamous cell carcinoma of lung, left ([place])   . Thyroid disease   . Tobacco abuse     PAST SURGICAL HISTORY: Past Surgical History:  Procedure Laterality Date  . ELECTROMAGNETIC NAVIGATION BROCHOSCOPY Left 10/19/2018   Procedure: ELECTROMAGNETIC NAVIGATION BRONCHOSCOPY LEFT;  Surgeon: Tyler Pita, MD;  Location: [facility] ORS;  Service: Cardiopulmonary;  Laterality: Left;  . ENDOBRONCHIAL ULTRASOUND Left 10/19/2018   Procedure: ENDOBRONCHIAL ULTRASOUND LEFT;  Surgeon: Tyler Pita, MD;  Location: [facility] ORS;  Service: Cardiopulmonary;  Laterality: Left;  Marland Kitchen MELANOMA EXCISION Left   . NO PAST SURGERIES      FAMILY HISTORY: Family History  Problem Relation Age of Onset  . Aneurysm Mother   . Cancer Father     ADVANCED DIRECTIVES (Y/N):  N  HEALTH MAINTENANCE: Social History   Tobacco Use  . Smoking status: Former Smoker    Packs/day: 0.25    Years: 62.00    Pack years: 15.50    Types: Cigarettes    Quit date: 09/05/2018    Years since quitting: 0.8  . Smokeless tobacco: Never Used  Substance Use Topics  . Alcohol use: No    Alcohol/week: 0.0 standard drinks  . Drug use: No     Colonoscopy:  PAP:  Bone density:  Lipid panel:  No Known Allergies  Current Outpatient Medications  Medication Sig Dispense  Refill  . albuterol (PROVENTIL HFA;VENTOLIN HFA) 108 (90 Base) MCG/ACT inhaler Inhale 2 puffs into the lungs every 6 (six) hours as needed for wheezing or shortness of breath. 1 Inhaler 0  . apixaban (ELIQUIS) 5 MG TABS tablet Take 1 tablet (5 mg total) by mouth 2 (two) times daily. 180 tablet 3  . budesonide-formoterol (SYMBICORT) 160-4.5 MCG/ACT inhaler Inhale 2 puffs into the lungs 2 (two)  times daily.    Marland Kitchen diltiazem (DILACOR XR) 120 MG 24 hr capsule Take 120 mg by mouth daily.    Marland Kitchen levothyroxine (SYNTHROID) 137 MCG tablet Take 137 mcg by mouth daily before breakfast.    . tamsulosin (FLOMAX) 0.4 MG CAPS capsule Take 0.4 mg by mouth daily.     . vitamin B-12 (CYANOCOBALAMIN) 1000 MCG tablet Take 1,000 mcg by mouth daily.    . Vitamin D, Ergocalciferol, (DRISDOL) 1.25 MG (50000 UT) CAPS capsule Take 50,000 Units by mouth every 7 (seven) days.     No current facility-administered medications for this visit.    OBJECTIVE: Vitals:   07/19/19 1320  BP: (!) 150/84  Pulse: 82  Resp: 18  Temp: 98 F (36.7 C)  SpO2: 91%     Body mass index is 35.73 kg/m.    ECOG FS:0 - Asymptomatic  General: Well-developed, well-nourished, no acute distress. Eyes: Pink conjunctiva, anicteric sclera. HEENT: Normocephalic, moist mucous membranes. Lungs: No audible wheezing or coughing. Heart: Regular rate and rhythm. Abdomen: Soft, nontender, no obvious distention. Musculoskeletal: No edema, cyanosis, or clubbing. Neuro: Alert, answering all questions appropriately. Cranial nerves grossly intact. Skin: No rashes or petechiae noted. Psych: Normal affect.   LAB RESULTS:  Lab Results  Component Value Date   NA 139 07/18/2019   K 4.2 07/18/2019   CL 108 07/18/2019   CO2 23 07/18/2019   GLUCOSE 151 (H) 07/18/2019   BUN 19 07/18/2019   CREATININE 1.36 (H) 07/18/2019   CALCIUM 8.4 (L) 07/18/2019   PROT 6.4 (L) 07/18/2019   ALBUMIN 3.7 07/18/2019   AST 29 07/18/2019   ALT 35 07/18/2019   ALKPHOS 51 07/18/2019   BILITOT 0.8 07/18/2019   GFRNONAA 50 (L) 07/18/2019   GFRAA 58 (L) 07/18/2019    Lab Results  Component Value Date   WBC 5.8 07/18/2019   NEUTROABS 3.5 07/18/2019   HGB 14.6 07/18/2019   HCT 44.1 07/18/2019   MCV 93.6 07/18/2019   PLT 162 07/18/2019     STUDIES: CT Chest W Contrast  Result Date: 07/18/2019 CLINICAL DATA:  Stage IIB left upper lobe squamous cell  lung cancer status post concurrent chemoradiation therapy completed 01/14/2019. restaging. Worsening dyspnea. Former smoker. EXAM: CT CHEST WITH CONTRAST TECHNIQUE: Multidetector CT imaging of the chest was performed during intravenous contrast administration. CONTRAST:  33mL OMNIPAQUE IOHEXOL 300 MG/ML  SOLN COMPARISON:  04/15/2019 PET-CT. 02/27/2019 CT chest, abdomen and pelvis. FINDINGS: Cardiovascular: Normal heart size. No significant pericardial effusion/thickening. Three-vessel coronary atherosclerosis. Atherosclerotic nonaneurysmal thoracic aorta. Normal caliber pulmonary arteries. No central pulmonary emboli. Mediastinum/Nodes: No discrete thyroid nodules. Unremarkable esophagus. No pathologically enlarged axillary, mediastinal or hilar lymph nodes. Lungs/Pleura: No pneumothorax. No pleural effusion. Severe centrilobular emphysema with diffuse bronchial wall thickening and saber sheath trachea. Apical right upper lobe 0.3 cm solid pulmonary nodule (series 3/image 43), stable. Spiculated left upper lobe 1.0 x 0.9 cm solid cavitary pulmonary nodule (series 3/image 44), mildly decreased from 1.2 x 1.0 cm on 02/27/2019 a chest CT using similar measurement technique. No acute consolidative airspace disease, lung masses  or new significant pulmonary nodules. Upper abdomen: Exophytic simple 2.3 cm upper right renal cyst. Musculoskeletal: No aggressive appearing focal osseous lesions. Moderate thoracic spondylosis. IMPRESSION: 1. Spiculated left upper lobe 1.0 cm solid cavitary pulmonary nodule, mildly decreased in size since 02/27/2019 chest CT. 2. Tiny 0.3 cm right apical solid pulmonary nodule is stable. 3. No new or progressive metastatic disease in the chest. 4. Severe centrilobular emphysema with diffuse bronchial wall thickening and saber sheath trachea, suggesting COPD. 5. Three-vessel coronary atherosclerosis. 6. Aortic Atherosclerosis (ICD10-I70.0) and Emphysema (ICD10-J43.9). Electronically Signed   By:  Ilona Sorrel M.D.   On: 07/18/2019 10:27    ASSESSMENT: Clinical stage IIB squamous cell carcinoma, left upper lobe lung.  PLAN:    1.Clinical stage IIB squamous cell carcinoma, left upper lobe lung: PET scan results from October 08, 2018 reviewed independently.  Patient underwent biopsy with navigational bronchoscopy on October 19, 2018 confirming diagnosis.  Patient declined surgery and wished to pursue XRT along with concurrent chemotherapy.  Given his stage of disease he does not qualify for maintenance immunotherapy.  He completed cycle 7 of weekly carboplatinum and Taxol on January 09, 2019 and then completed XRT on January 14, 2019.  CT scan results from July 18, 2019 reviewed independently and reported as above with no obvious evidence of recurrent or metastatic disease.  No intervention is needed at this time.  Return to clinic in 6 months with repeat imaging and further evaluation.   2.  History of melanoma: Unclear stage or depth, although by report was in situ.  Patient had Mohs surgery in fall 2019.  Lung biopsy consistent with squamous cell carcinoma. 3.  Anxiety/depression: Improved.  Continue current medications as prescribed.  Continue follow-up with primary care physician as well as psychiatry at the [place]. 4.  Renal insufficiency: Creatinine has trended up slightly to 1.36, monitor. 5.  Shortness of breath/cough: Patient does not complain of this today.  Continue follow-up and treatment per pulmonary.  Patient expressed understanding and was in agreement with this plan. He also understands that He can call clinic at any time with any questions, concerns, or complaints.   Cancer Staging Squamous cell carcinoma lung, left ([place]) Staging form: Lung, AJCC 8th Edition - Clinical stage from 10/27/2018: Stage IIB (cT1c, cN1, cM0) - Signed by Lloyd Huger, MD on 11/16/2018   Lloyd Huger, MD   07/19/2019 3:35 PM

## 2019-10-10 DIAGNOSIS — M7541 Impingement syndrome of right shoulder: Secondary | ICD-10-CM | POA: Insufficient documentation

## 2019-10-21 ENCOUNTER — Ambulatory Visit: Payer: Medicare Other | Admitting: Radiation Oncology

## 2020-01-13 IMAGING — CT CT ABD-PELV W/ CM
2 of 5 series · 14 of 46 positions shown, 16 images · IV contrast (omnipaque)
Comparison: PET-CT, 10/08/2018, CT chest, 09/21/2018

CLINICAL DATA: Follow-up lung cancer, recent shortness of breath,
nausea, fatigue, weight loss

EXAM:
CT CHEST, ABDOMEN, AND PELVIS WITH CONTRAST
TECHNIQUE: Multidetector CT imaging of the chest, abdomen and pelvis was
performed following the standard protocol during bolus
administration of intravenous contrast.
CONTRAST:  100mL OMNIPAQUE IOHEXOL 300 MG/ML SOLN, additional oral
enteric contrast

[Series 2: axials cap 5.00 · axial · 0.88mm/px · z∈[+1400,+1955]mm · 11 of 135 slices shown, 13 images]
[im 12/135  soft-tissue]
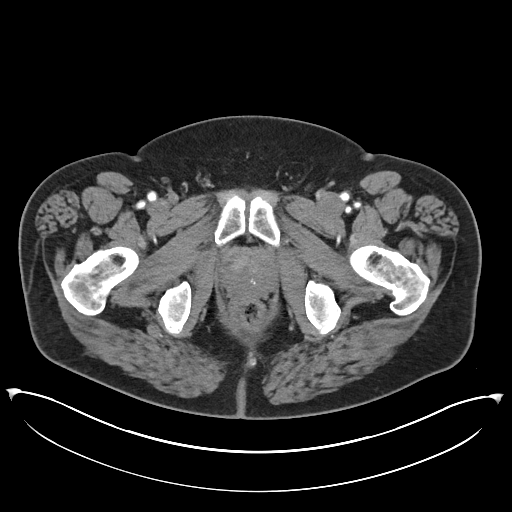
[im 12/135  bone]
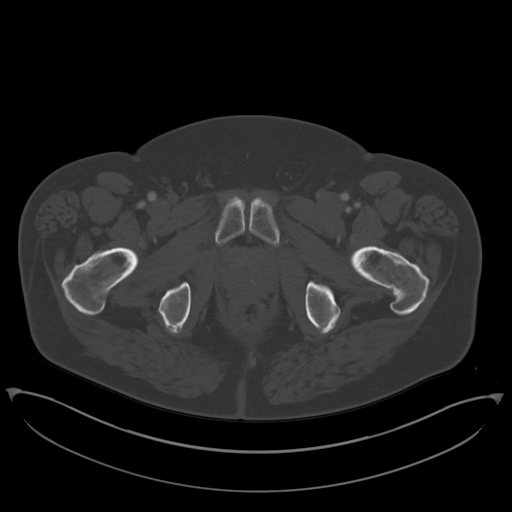
[im 23/135  soft-tissue]
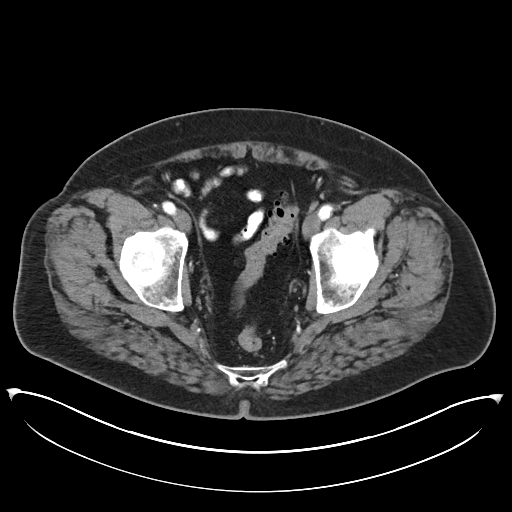
[im 34/135  soft-tissue]
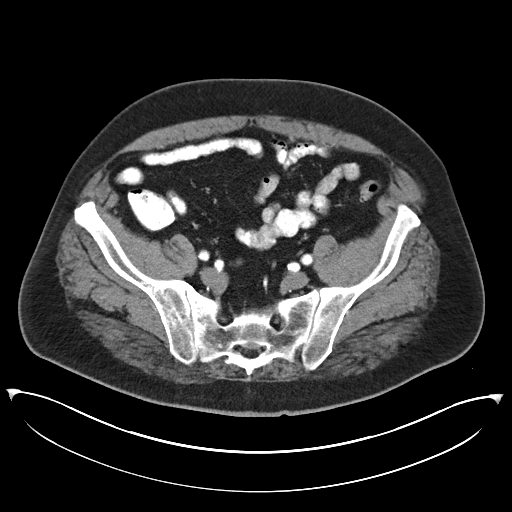
[im 45/135  soft-tissue]
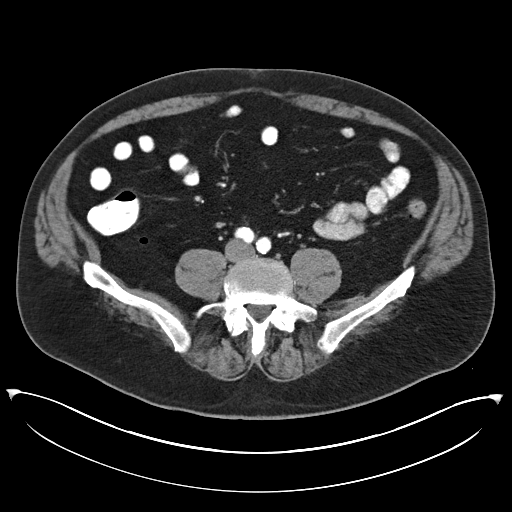
[im 56/135  soft-tissue]
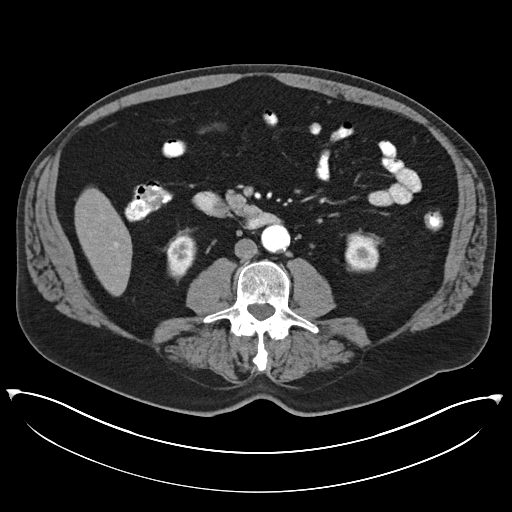
[im 68/135  soft-tissue]
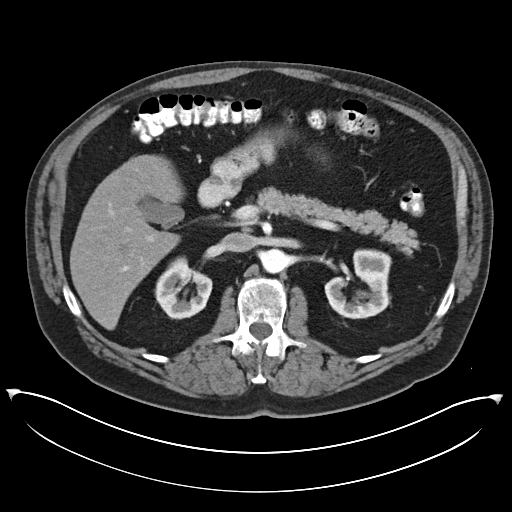
[im 79/135  soft-tissue]
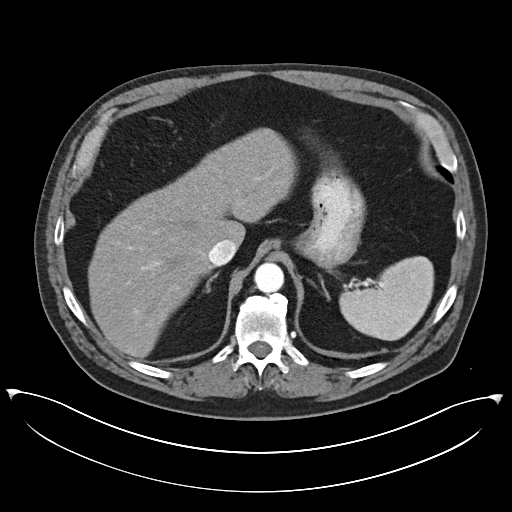
[im 90/135  soft-tissue]
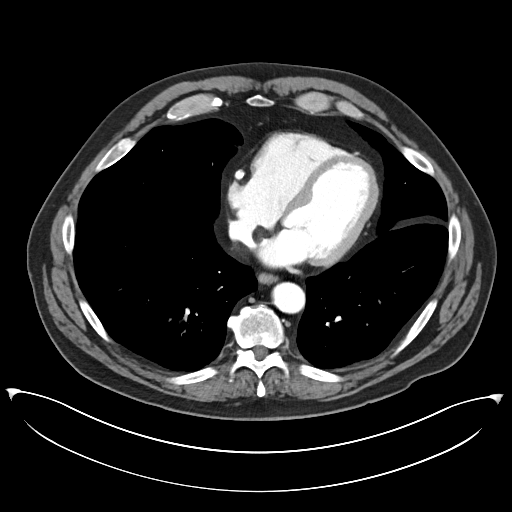
[im 101/135  soft-tissue]
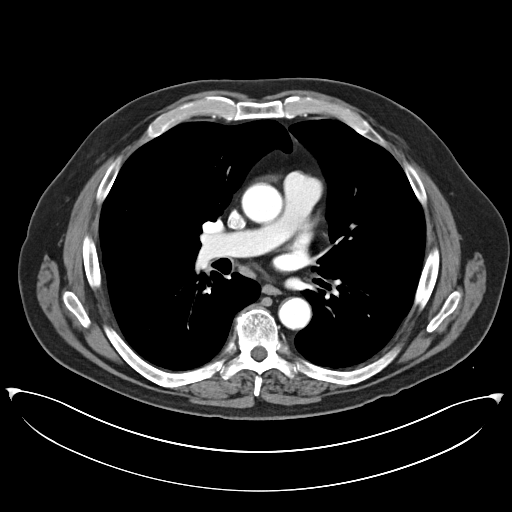
[im 101/135  bone]
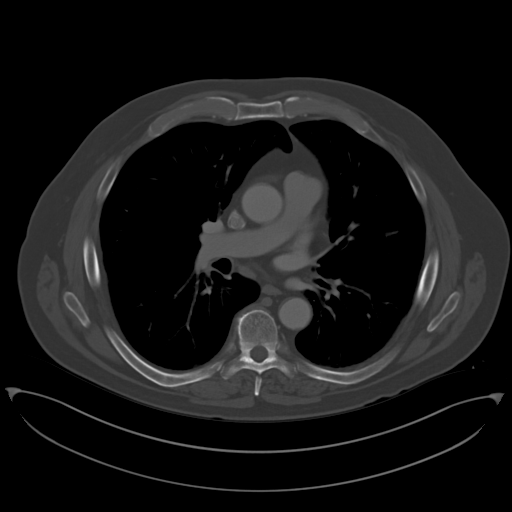
[im 112/135  soft-tissue]
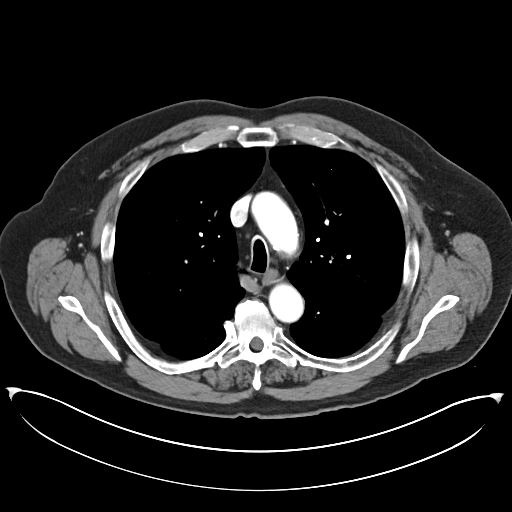
[im 123/135  soft-tissue]
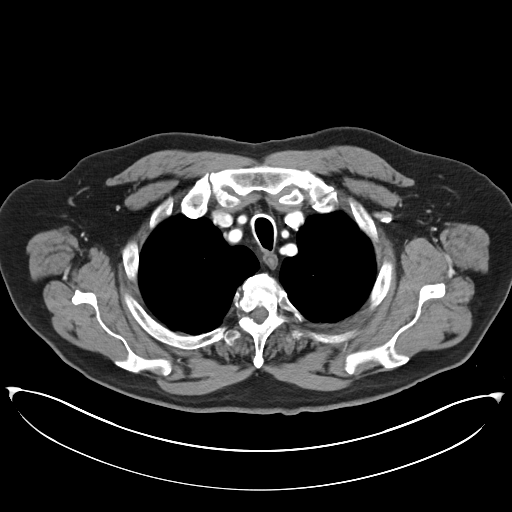

[Series 4: coronals cap 2.00 cor · coronal · 0.88mm/px · 3 of 160 slices shown]
[im 54/160  soft-tissue]
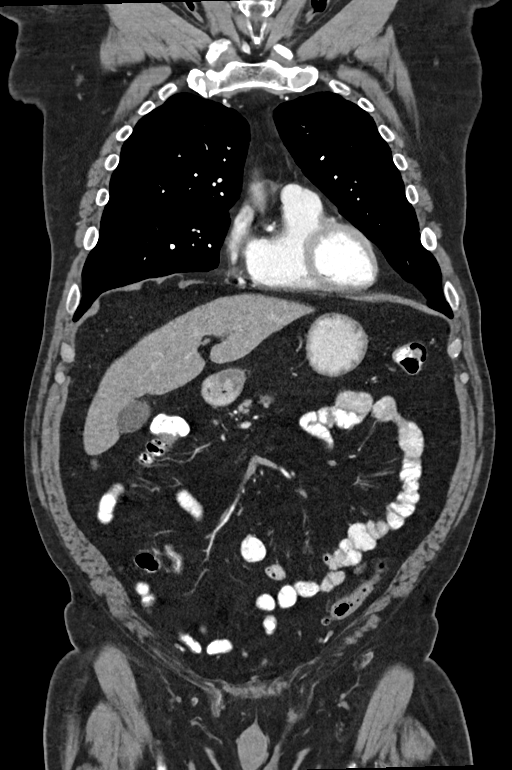
[im 71/160  soft-tissue]
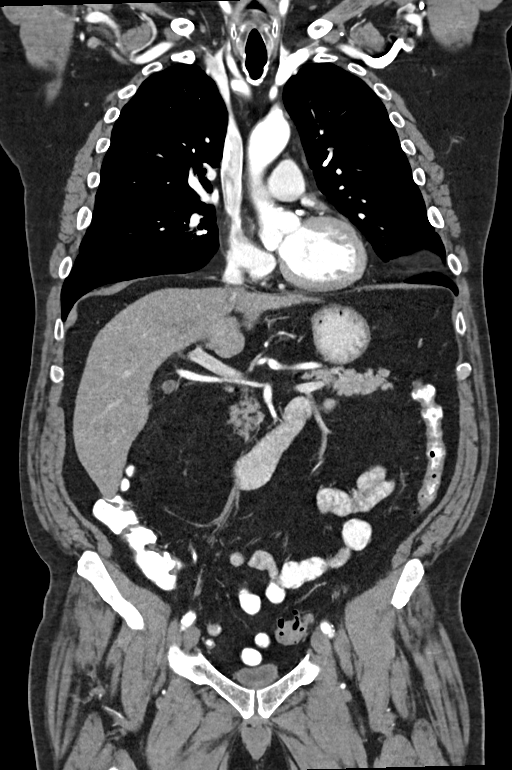
[im 89/160  soft-tissue]
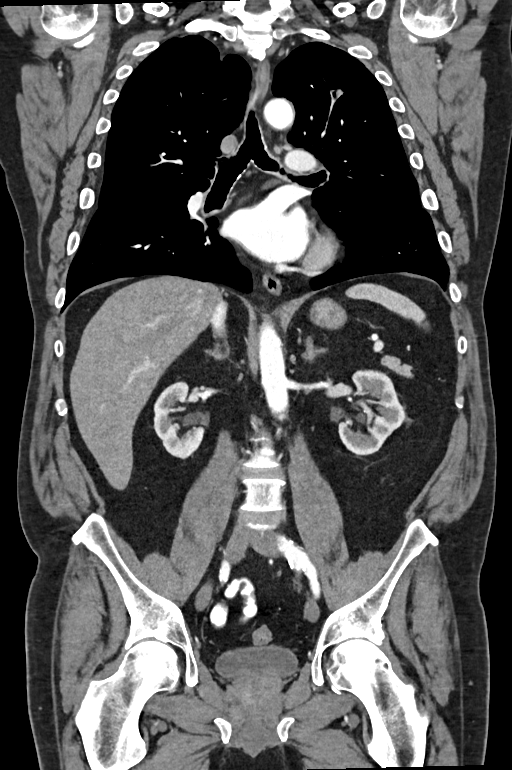

[14 of 46 positions shown; findings below may reference images not displayed]

FINDINGS: CT CHEST FINDINGS

Cardiovascular: Aortic atherosclerosis. Normal heart size.
Three-vessel coronary artery calcifications. No pericardial
effusion.

Mediastinum/Nodes: Interval reduction in size of a previously FDG
avid left hilar lymph node or very central left upper lobe pulmonary
nodule, measuring 1.5 x 0.7 cm, previously 2.0 x 1.3 cm when
measured similarly on prior noncontrast examinations (series 2,
image 29). Thyroid gland, trachea, and esophagus demonstrate no
significant findings.

Lungs/Pleura: Severe centrilobular emphysema. Diffuse bilateral
bronchial wall thickening. Significant interval reduction in size of
a left upper lobe pulmonary nodule, now measuring 1.0 x 0.7 cm with
new internal cavitation, previously measuring 2.0 x 2.0 cm (series
3, image 43). A small adjacent satellite nodule is resolved. A
previously seen small right upper lobe pulmonary nodule is resolved.
No pleural effusion or pneumothorax.

Musculoskeletal: No chest wall mass or suspicious bone lesions
identified.

CT ABDOMEN PELVIS FINDINGS

Hepatobiliary: No solid liver abnormality is seen. No gallstones,
gallbladder wall thickening, or biliary dilatation.

Pancreas: Unremarkable. No pancreatic ductal dilatation or
surrounding inflammatory changes.

Spleen: Normal in size without significant abnormality.

Adrenals/Urinary Tract: Adrenal glands are unremarkable. Kidneys are
normal, without renal calculi, solid lesion, or hydronephrosis.
Bladder is unremarkable.

Stomach/Bowel: Stomach is within normal limits. Appendix appears
normal. No evidence of bowel wall thickening, distention, or
inflammatory changes. Sigmoid diverticulosis.

Vascular/Lymphatic: Aortic atherosclerosis. No enlarged abdominal or
pelvic lymph nodes.

Reproductive: Prostatomegaly.

Other: Small fat containing left inguinal hernia. No abdominopelvic
ascites.

Musculoskeletal: No acute or significant osseous findings.
IMPRESSION: 1. Significant interval reduction in size of a left upper lobe
pulmonary nodule, now measuring 1.0 x 0.7 cm with new internal
cavitation, previously measuring 2.0 x 2.0 cm (series 3, image 43).
A small adjacent satellite nodule is resolved. A previously seen
small right upper lobe pulmonary nodule is resolved.

2. Interval reduction in size of a previously FDG avid left hilar
lymph node or very central left upper lobe pulmonary nodule,
measuring 1.5 x 0.7 cm, previously 2.0 x 1.3 cm when measured
similarly on prior noncontrast examinations (series 2, image 29).

3.  Above findings are consistent with treatment response.

4. Severe emphysema and diffuse bilateral bronchial wall thickening
consistent with nonspecific infectious or inflammatory bronchitis.
Emphysema ([id number]).

5.  No evidence of metastatic disease in the abdomen or pelvis.

6. Chronic and incidental findings of the abdomen and pelvis as
detailed above.

7.  Coronary artery disease.  Aortic Atherosclerosis ([id number]).

## 2020-01-17 ENCOUNTER — Encounter: Payer: Self-pay | Admitting: Oncology

## 2020-01-17 ENCOUNTER — Other Ambulatory Visit: Payer: Self-pay

## 2020-01-17 ENCOUNTER — Ambulatory Visit
Admission: RE | Admit: 2020-01-17 | Discharge: 2020-01-17 | Disposition: A | Discharge status: Home / Self Care | Payer: Medicare Other | Source: Ambulatory Visit | Attending: Oncology | Admitting: Oncology

## 2020-01-17 DIAGNOSIS — C3492 Malignant neoplasm of unspecified part of left bronchus or lung: Secondary | ICD-10-CM | POA: Insufficient documentation

## 2020-01-17 LAB — POCT I-STAT CREATININE: Creatinine, Ser: 1.8 mg/dL — ABNORMAL HIGH (ref 0.61–1.24)

## 2020-01-17 MED ORDER — IOHEXOL 300 MG/ML  SOLN
75.0000 mL | Freq: Once | INTRAMUSCULAR | Status: AC | PRN
  Administered 2020-01-17: 60 mL via INTRAVENOUS

## 2020-01-17 NOTE — Progress Notes (Signed)
[place]  [phone] Fax:(336) [phone]  ID: Levi [name] OB: 1940/12/14  MR#: [id number]  PNT#:[id number]  Patient Care Team: Kirke Corin, FNP as PCP - General (Family Medicine) Kate Sable, MD as PCP - Cardiology (Cardiology) Telford Nab, RN as Registered Nurse Grayland Ormond, Kathlene November, MD as Consulting Physician (Oncology) Noreene Filbert, MD as Referring Physician (Radiation Oncology) Nestor Lewandowsky, MD as Referring Physician (Cardiothoracic Surgery)  CHIEF COMPLAINT: Clinical stage IIB squamous cell carcinoma, left upper lobe lung.  INTERVAL HISTORY: Patient returns to clinic today for routine 52-month evaluation and discussion of his imaging results.  He continues to be anxious.  He also complains of chronic weakness and fatigue. He has no neurologic complaints.  He denies any recent fevers or illnesses.  He does not complain of any cough or shortness of breath today.  He denies any hemoptysis or chest pain.  He has a good appetite and denies weight loss.  He denies any nausea, vomiting, constipation, or diarrhea.  He has no urinary complaints.  Patient offers no further specific complaints today.  REVIEW OF SYSTEMS:   Review of Systems  Constitutional: Positive for malaise/fatigue. Negative for fever and weight loss.  HENT: Negative.  Negative for congestion.   Respiratory: Negative.  Negative for cough, hemoptysis and shortness of breath.   Cardiovascular: Negative.  Negative for chest pain and leg swelling.  Gastrointestinal: Negative.  Negative for abdominal pain and nausea.  Genitourinary: Negative.  Negative for dysuria.  Musculoskeletal: Negative.  Negative for back pain.  Skin: Negative for rash.  Neurological: Positive for weakness. Negative for dizziness, focal weakness and headaches.  Endo/Heme/Allergies: Does not bruise/bleed easily.  Psychiatric/Behavioral: Negative for depression. The patient is nervous/anxious.     As  per HPI. Otherwise, a complete review of systems is negative.  PAST MEDICAL HISTORY: Past Medical History:  Diagnosis Date  . Asthma   . COPD (chronic obstructive pulmonary disease) ([place])   . Hearing loss   . Hypothyroidism   . Mass of left lung   . 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 Prostate enlargement   . Shortness of breath   . Squamous cell carcinoma of lung, left ([place])   . Thyroid disease   . Tobacco abuse     PAST SURGICAL HISTORY: Past Surgical History:  Procedure Laterality Date  . ELECTROMAGNETIC NAVIGATION BROCHOSCOPY Left 10/19/2018   Procedure: ELECTROMAGNETIC NAVIGATION BRONCHOSCOPY LEFT;  Surgeon: Tyler Pita, MD;  Location: [facility] ORS;  Service: Cardiopulmonary;  Laterality: Left;  . ENDOBRONCHIAL ULTRASOUND Left 10/19/2018   Procedure: ENDOBRONCHIAL ULTRASOUND LEFT;  Surgeon: Tyler Pita, MD;  Location: [facility] ORS;  Service: Cardiopulmonary;  Laterality: Left;  Marland Kitchen MELANOMA EXCISION Left   . NO PAST SURGERIES      FAMILY HISTORY: Family History  Problem Relation Age of Onset  . Aneurysm Mother   . Cancer Father     ADVANCED DIRECTIVES (Y/N):  N  HEALTH MAINTENANCE: Social History   Tobacco Use  . Smoking status: Former Smoker    Packs/day: 0.25    Years: 62.00    Pack years: 15.50    Types: Cigarettes    Quit date: 09/05/2018    Years since quitting: 1.3  . Smokeless tobacco: Never Used  Vaping Use  . Vaping Use: Never used  Substance Use Topics  . Alcohol use: No    Alcohol/week: 0.0 standard drinks  . Drug use: No     Colonoscopy:  PAP:  Bone density:  Lipid panel:  No Known Allergies  Current Outpatient Medications  Medication Sig Dispense Refill  . albuterol (PROVENTIL HFA;VENTOLIN HFA) 108 (90 Base) MCG/ACT inhaler Inhale 2 puffs into the lungs every 6 (six) hours as needed for wheezing or shortness of breath. 1 Inhaler 0  . apixaban (ELIQUIS) 5 MG TABS tablet Take 1 tablet (5 mg total) by mouth 2 (two) times daily. 180  tablet 3  . budesonide-formoterol (SYMBICORT) 160-4.5 MCG/ACT inhaler Inhale 2 puffs into the lungs 2 (two) times daily.    Marland Kitchen diltiazem (DILACOR XR) 120 MG 24 hr capsule Take 120 mg by mouth daily.    Marland Kitchen levothyroxine (SYNTHROID) 137 MCG tablet Take 137 mcg by mouth daily before breakfast.    . tamsulosin (FLOMAX) 0.4 MG CAPS capsule Take 0.4 mg by mouth daily.     . vitamin B-12 (CYANOCOBALAMIN) 1000 MCG tablet Take 1,000 mcg by mouth daily.    . Vitamin D, Ergocalciferol, (DRISDOL) 1.25 MG (50000 UT) CAPS capsule Take 50,000 Units by mouth every 7 (seven) days. (Patient not taking: Reported on 01/20/2020)     No current facility-administered medications for this visit.    OBJECTIVE: Vitals:   01/20/20 0936  BP: (!) 151/95  Pulse: 87  Resp: 18  Temp: 99.2 F (37.3 C)  SpO2: 94%     Body mass index is 37.36 kg/m.    ECOG FS:0 - Asymptomatic  General: Well-developed, well-nourished, no acute distress. Eyes: Pink conjunctiva, anicteric sclera. HEENT: Normocephalic, moist mucous membranes. Lungs: No audible wheezing or coughing. Heart: Regular rate and rhythm. Abdomen: Soft, nontender, no obvious distention. Musculoskeletal: No edema, cyanosis, or clubbing. Neuro: Alert, answering all questions appropriately. Cranial nerves grossly intact. Skin: No rashes or petechiae noted. Psych: Normal affect.  LAB RESULTS:  Lab Results  Component Value Date   NA 139 07/18/2019   K 4.2 07/18/2019   CL 108 07/18/2019   CO2 23 07/18/2019   GLUCOSE 151 (H) 07/18/2019   BUN 19 07/18/2019   CREATININE 1.80 (H) 01/17/2020   CALCIUM 8.4 (L) 07/18/2019   PROT 6.4 (L) 07/18/2019   ALBUMIN 3.7 07/18/2019   AST 29 07/18/2019   ALT 35 07/18/2019   ALKPHOS 51 07/18/2019   BILITOT 0.8 07/18/2019   GFRNONAA 50 (L) 07/18/2019   GFRAA 58 (L) 07/18/2019    Lab Results  Component Value Date   WBC 5.8 07/18/2019   NEUTROABS 3.5 07/18/2019   HGB 14.6 07/18/2019   HCT 44.1 07/18/2019   MCV 93.6  07/18/2019   PLT 162 07/18/2019     STUDIES: CT Chest W Contrast  Result Date: 01/17/2020 CLINICAL DATA:  Follow-up left lung cancer EXAM: CT CHEST WITH CONTRAST TECHNIQUE: Multidetector CT imaging of the chest was performed during intravenous contrast administration. CONTRAST:  71mL OMNIPAQUE IOHEXOL 300 MG/ML  SOLN COMPARISON:  07/18/2019 FINDINGS: Cardiovascular: Aortic atherosclerosis. Normal heart size. Three-vessel coronary artery calcifications. No pericardial effusion. Mediastinum/Nodes: No enlarged mediastinal, hilar, or axillary lymph nodes. Thyroid gland, trachea, and esophagus demonstrate no significant findings. 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 Diffuse bilateral bronchial wall thickening. Unchanged spiculated nodule of the central left upper lobe, measuring 1.0 cm (series 3, image 43). Interval development of heterogeneous consolidation and volume loss of the posterior perihilar left upper lobe (series 3, image 59). Upper Abdomen: No acute abnormality.  Hepatic steatosis. Musculoskeletal: No chest wall mass or suspicious bone lesions identified. IMPRESSION: 1. Unchanged spiculated nodule of the central left upper lobe, measuring 1.0 cm. 2. Interval development of  heterogeneous consolidation and volume loss of the posterior perihilar left upper lobe. 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 Attention on follow-up. 3. 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 Emphysema (ICD10-J43.9). Diffuse bilateral bronchial wall thickening, consistent with nonspecific infectious or inflammatory bronchitis. 4. Coronary artery disease.  Aortic Atherosclerosis (ICD10-I70.0) 5. Hepatic steatosis. Electronically Signed   By: Eddie Candle M.D.   On: 01/17/2020 13:43    ASSESSMENT: Clinical stage IIB squamous cell carcinoma, left upper lobe lung.  PLAN:    1.Clinical stage IIB squamous cell carcinoma, left upper lobe lung: PET scan results from October 08, 2018  reviewed independently.  Patient underwent biopsy with navigational bronchoscopy on October 19, 2018 confirming diagnosis.  Patient declined surgery and wished to pursue XRT along with concurrent chemotherapy.  Given his stage of disease he did not receive maintenance immunotherapy.  He completed cycle 7 of weekly carboplatinum and Taxol on January 09, 2019 and then completed XRT on January 14, 2019.  CT scan results from January 17, 2020 reviewed independently and report as above with unchanged spiculated nodule measuring 1 cm in left upper lobe and no evidence of recurrent or progressive disease.  No intervention is needed.  Return to clinic in 6 months with repeat imaging and further evaluation.   2.  History of melanoma: Unclear stage or depth, although by report was in situ.  Patient had Mohs surgery in fall 2019.  Lung biopsy consistent with squamous cell carcinoma. 3.  Anxiety/depression: Chronic and unchanged.  Continue current medications as prescribed.  Continue follow-up with primary care physician as well as psychiatry at the [place]. 4.  Renal insufficiency: Patient's most recent creatinine has trended up to 1.8.  Continue monitoring and treatment per primary care at the [facility]. 5.  Shortness of breath/cough: Patient does not complain of this today.  Continue follow-up and treatment per pulmonary. 6.  Weakness and fatigue: Multifactorial.  Patient expressed understanding and was in agreement with this plan. He also understands that He can call clinic at any time with any questions, concerns, or complaints.   Cancer Staging Squamous cell carcinoma lung, left ([place]) Staging form: Lung, AJCC 8th Edition - Clinical stage from 10/27/2018: Stage IIB (cT1c, cN1, cM0) - Signed by Lloyd Huger, MD on 11/16/2018   Lloyd Huger, MD   01/20/2020 10:40 AM

## 2020-01-17 NOTE — Progress Notes (Signed)
Patient called for pre assessment. Denies any pain at this time. States shortness of breath has gotten worse since stopping the treatment. Not sure if mainly from COPD. Also reports he is still having dizziness off and on. Has concerns about bottom lip going numb. Wants to discuss is this side effect from eliquis or something else.

## 2020-01-20 ENCOUNTER — Ambulatory Visit
Admission: RE | Admit: 2020-01-20 | Discharge: 2020-01-20 | Disposition: A | Discharge status: Home / Self Care | Payer: Medicare Other | Source: Ambulatory Visit | Attending: Radiation Oncology | Admitting: Radiation Oncology

## 2020-01-20 ENCOUNTER — Encounter: Payer: Self-pay | Admitting: Oncology

## 2020-01-20 ENCOUNTER — Other Ambulatory Visit: Payer: Self-pay

## 2020-01-20 ENCOUNTER — Inpatient Hospital Stay: Payer: Medicare Other | Attending: Oncology | Admitting: Oncology

## 2020-01-20 VITALS — BP 151/95 | HR 87 | Temp 99.2°F | Resp 18 | Ht 71.0 in | Wt 267.9 lb

## 2020-01-20 DIAGNOSIS — Z87891 Personal history of nicotine dependence: Secondary | ICD-10-CM | POA: Insufficient documentation

## 2020-01-20 DIAGNOSIS — Z9221 Personal history of antineoplastic chemotherapy: Secondary | ICD-10-CM | POA: Diagnosis not present

## 2020-01-20 DIAGNOSIS — C3412 Malignant neoplasm of upper lobe, left bronchus or lung: Secondary | ICD-10-CM | POA: Insufficient documentation

## 2020-01-20 DIAGNOSIS — R531 Weakness: Secondary | ICD-10-CM | POA: Diagnosis not present

## 2020-01-20 DIAGNOSIS — F418 Other specified anxiety disorders: Secondary | ICD-10-CM | POA: Diagnosis not present

## 2020-01-20 DIAGNOSIS — C3492 Malignant neoplasm of unspecified part of left bronchus or lung: Secondary | ICD-10-CM

## 2020-01-20 DIAGNOSIS — Z923 Personal history of irradiation: Secondary | ICD-10-CM | POA: Diagnosis not present

## 2020-01-20 DIAGNOSIS — N289 Disorder of kidney and ureter, unspecified: Secondary | ICD-10-CM | POA: Diagnosis not present

## 2020-01-20 DIAGNOSIS — R5383 Other fatigue: Secondary | ICD-10-CM | POA: Insufficient documentation

## 2020-01-20 NOTE — Progress Notes (Signed)
Radiation Oncology Follow up Note  Name: Levi [name]   Date:   01/20/2020 MRN:  [id number] DOB: October 23, 1940    This 79 y.o. male presents to the clinic today for 1 year follow-up status post concurrent chemoradiation therapy for stage IIa (T1 N1 M0) non-small cell lung cancer of the left upper lobe.  REFERRING PROVIDER: Blane Ohara, MD  HPI: Patient is a 79 year old male now at 1 year having completed concurrent chemoradiation therapy for stage IIa presumed squamous of carcinoma left upper lobe.  Seen today in routine follow-up he is doing fairly well he is gained about 40 pounds.  His exercise tolerance is poor.  He has a mild nonproductive cough no bone pain or hemoptysis.Marland Kitchen  He had a recent CT scan showing unchanged spiculated nodule in the central left upper lobe measuring 1.60 cm was not hypermetabolic on previous PET scan back in December 2020.  He does have some consolidation and volume loss of the posterior perihilar left upper lobe consistent with radiation fibrosis.  COMPLICATIONS OF TREATMENT: none  FOLLOW UP COMPLIANCE: keeps appointments   PHYSICAL EXAM:  There were no vitals taken for this visit. Well-developed obese male in NAD.  Well-developed well-nourished patient in NAD. HEENT reveals PERLA, EOMI, discs not visualized.  Oral cavity is clear. No oral mucosal lesions are identified. Neck is clear without evidence of cervical or supraclavicular adenopathy. Lungs are clear to A&P. Cardiac examination is essentially unremarkable with regular rate and rhythm without murmur rub or thrill. Abdomen is benign with no organomegaly or masses noted. Motor sensory and DTR levels are equal and symmetric in the upper and lower extremities. Cranial nerves II through XII are grossly intact. Proprioception is intact. No peripheral adenopathy or edema is identified. No motor or sensory levels are noted. Crude visual fields are within normal range.  RADIOLOGY RESULTS: CT scans and previous PET  CT scan reviewed compatible with above-stated findings  PLAN: Present time patient is doing well from oncology standpoint.  And pleased with his overall progress.  We will follow up on his CT scan which is scheduled for next March.  I have asked to see him back in 6 months for follow-up and then will start once year follow-up visits.  He continues close follow-up care with medical oncology.  I would like to take this opportunity to thank you for allowing me to participate in the care of your patient.Noreene Filbert, MD

## 2020-02-13 IMAGING — DX DG CHEST 1V PORT
1 series · 2 of 2 positions shown · non-contrast
Comparison: None.

CLINICAL DATA: Atrial fibrillation.  Near syncope.

EXAM:
PORTABLE CHEST 1 VIEW

[Series 1: chest ap · 0.14mm/px · 2 of 2 slices shown]
[im 1/2]
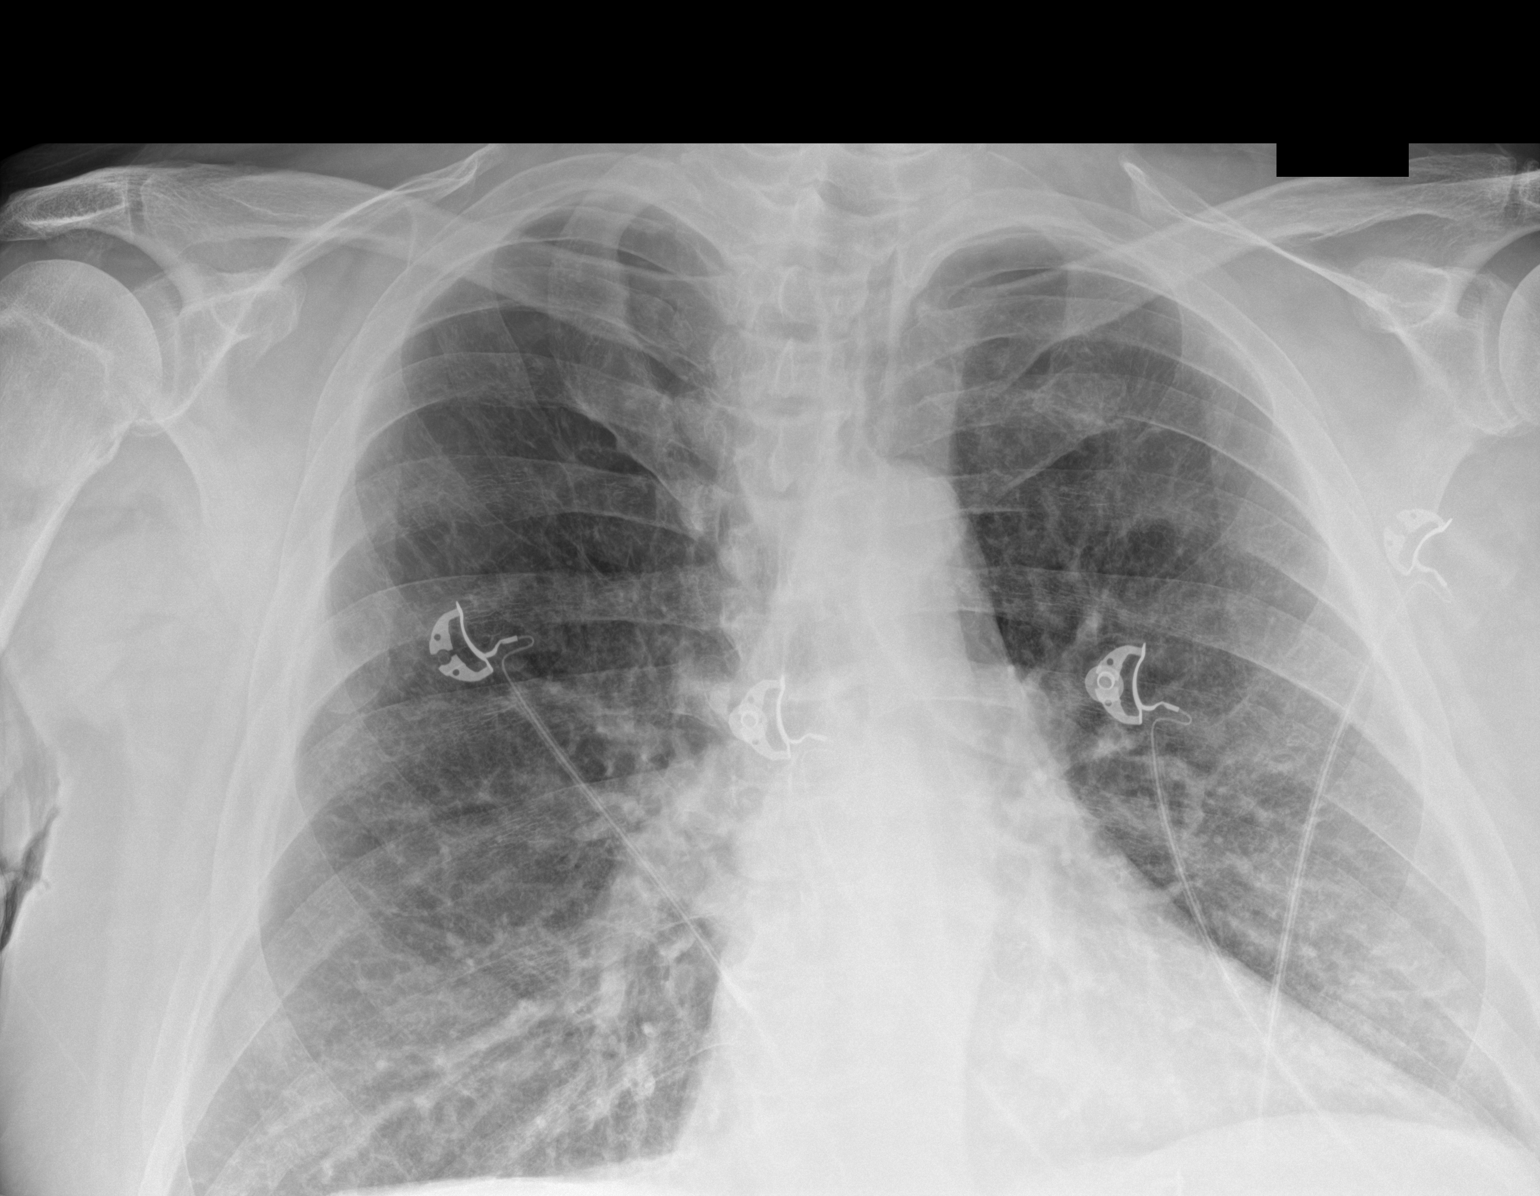
[im 2/2]
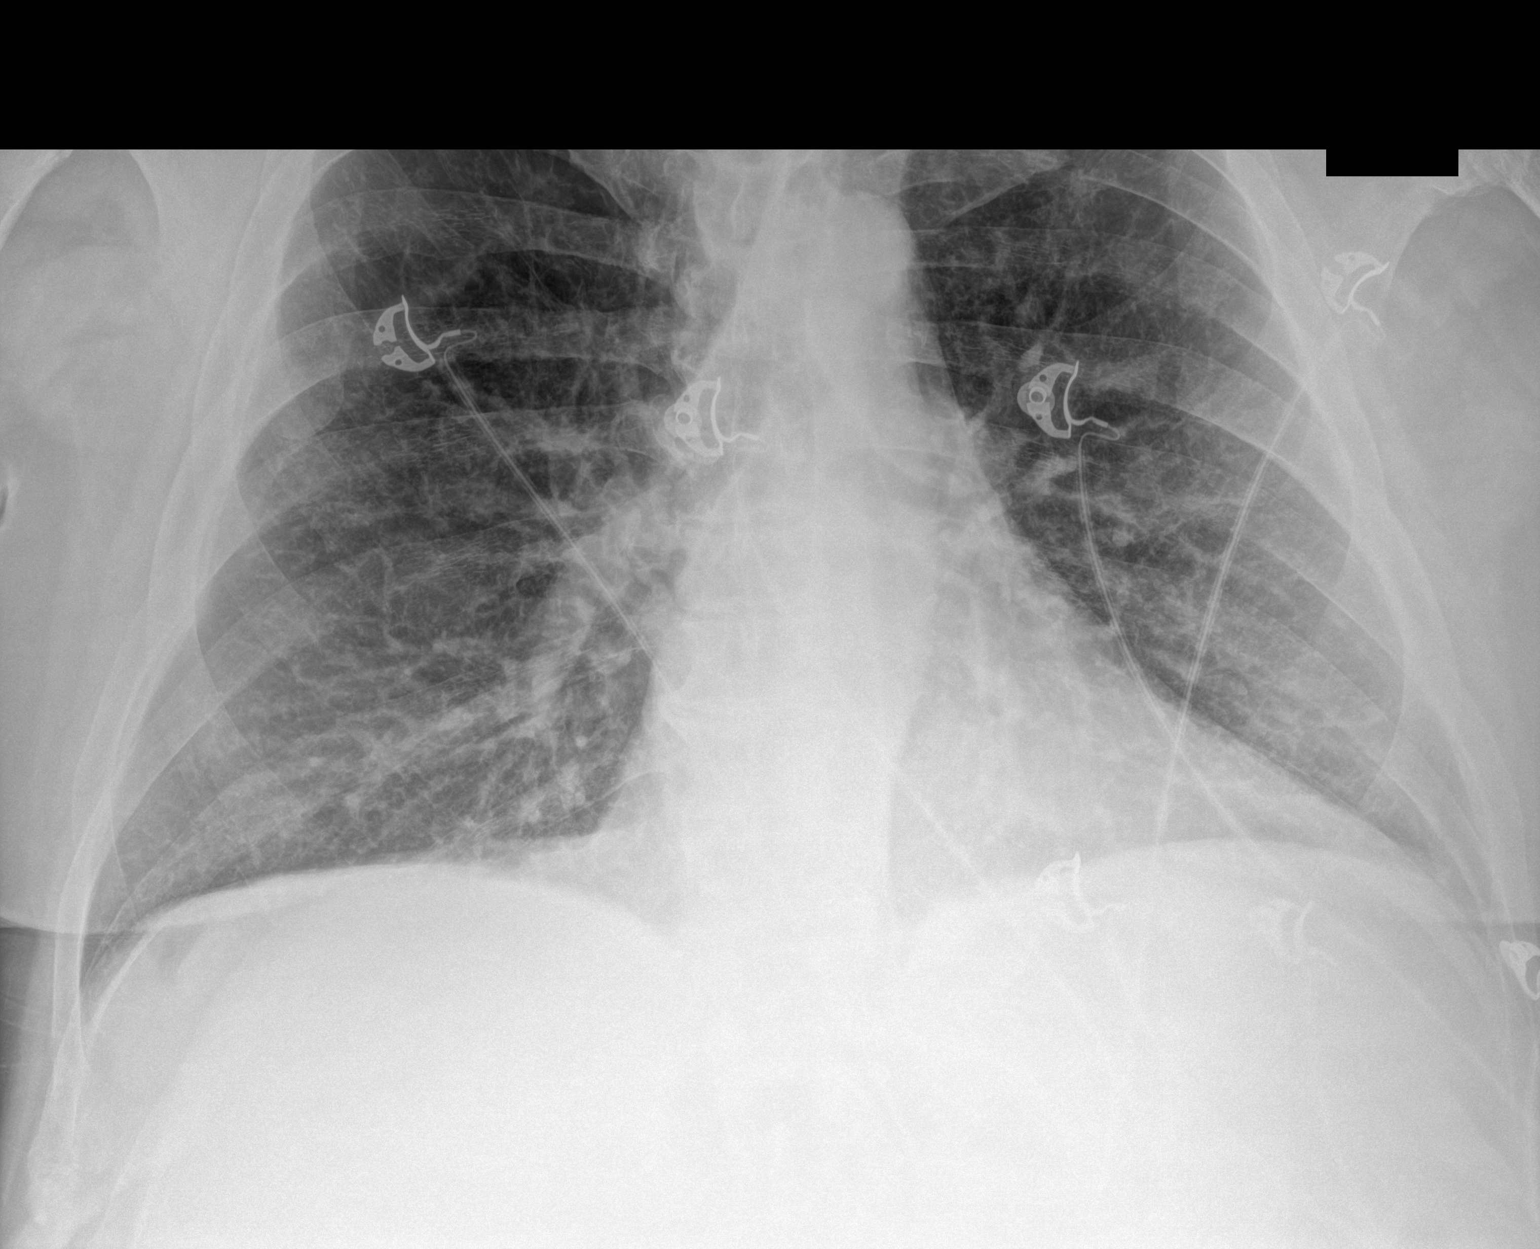

[2 of 2 positions shown; findings below may reference images not displayed]

FINDINGS: There is no edema or consolidation. Heart is mildly enlarged with
pulmonary vascularity normal. No adenopathy. No bone lesions.
IMPRESSION: Mild cardiac enlargement with pulmonary vascularity normal. No edema
or consolidation.

## 2020-07-14 ENCOUNTER — Other Ambulatory Visit: Payer: Self-pay | Admitting: *Deleted

## 2020-07-14 DIAGNOSIS — C3492 Malignant neoplasm of unspecified part of left bronchus or lung: Secondary | ICD-10-CM

## 2020-07-15 ENCOUNTER — Other Ambulatory Visit: Payer: Self-pay

## 2020-07-15 ENCOUNTER — Ambulatory Visit
Admission: RE | Admit: 2020-07-15 | Discharge: 2020-07-15 | Disposition: A | Discharge status: Home / Self Care | Payer: No Typology Code available for payment source | Source: Ambulatory Visit | Attending: Oncology | Admitting: Oncology

## 2020-07-15 DIAGNOSIS — C3492 Malignant neoplasm of unspecified part of left bronchus or lung: Secondary | ICD-10-CM | POA: Diagnosis not present

## 2020-07-15 HISTORY — DX: Essential (primary) hypertension: I10

## 2020-07-15 LAB — POCT I-STAT CREATININE: Creatinine, Ser: 1.8 mg/dL — ABNORMAL HIGH (ref 0.61–1.24)

## 2020-07-15 MED ORDER — IOHEXOL 300 MG/ML  SOLN
60.0000 mL | Freq: Once | INTRAMUSCULAR | Status: AC | PRN
  Administered 2020-07-15: 60 mL via INTRAVENOUS

## 2020-07-15 MED ORDER — IOHEXOL 300 MG/ML  SOLN: 75.0000 mL | Freq: Once | INTRAMUSCULAR | Status: DC | PRN

## 2020-07-16 ENCOUNTER — Other Ambulatory Visit: Payer: Self-pay | Admitting: Licensed Clinical Social Worker

## 2020-07-16 ENCOUNTER — Ambulatory Visit
Admission: RE | Admit: 2020-07-16 | Discharge: 2020-07-16 | Disposition: A | Discharge status: Home / Self Care | Payer: No Typology Code available for payment source | Source: Ambulatory Visit | Attending: Radiation Oncology | Admitting: Radiation Oncology

## 2020-07-16 ENCOUNTER — Inpatient Hospital Stay: Payer: Medicare Other | Attending: Oncology | Admitting: Oncology

## 2020-07-16 VITALS — BP 142/85 | HR 77 | Temp 99.0°F | Wt 275.0 lb

## 2020-07-16 DIAGNOSIS — C3492 Malignant neoplasm of unspecified part of left bronchus or lung: Secondary | ICD-10-CM | POA: Diagnosis not present

## 2020-07-16 DIAGNOSIS — Z87891 Personal history of nicotine dependence: Secondary | ICD-10-CM | POA: Diagnosis not present

## 2020-07-16 DIAGNOSIS — Z85820 Personal history of malignant melanoma of skin: Secondary | ICD-10-CM | POA: Insufficient documentation

## 2020-07-16 DIAGNOSIS — Z85118 Personal history of other malignant neoplasm of bronchus and lung: Secondary | ICD-10-CM | POA: Insufficient documentation

## 2020-07-16 DIAGNOSIS — C3412 Malignant neoplasm of upper lobe, left bronchus or lung: Secondary | ICD-10-CM

## 2020-07-16 NOTE — Progress Notes (Signed)
[place]  Telephone:(336) [phone] Fax:(336) [phone]  ID: [name] OB: 04-Mar-1941  MR#: [id number]  ZTI#:[id number]  Patient Care Team: Kirke Corin, FNP as PCP - General (Family Medicine) Kate Sable, MD as PCP - Cardiology (Cardiology) Telford Nab, RN as Registered Nurse Grayland Ormond, Kathlene November, MD as Consulting Physician (Oncology) Noreene Filbert, MD as Referring Physician (Radiation Oncology) Nestor Lewandowsky, MD as Referring Physician (Cardiothoracic Surgery)  CHIEF COMPLAINT: Clinical stage IIB squamous cell carcinoma, left upper lobe lung.  INTERVAL HISTORY: Patient returns to clinic today for routine 77-month evaluation and discussion of his imaging results.  He continues to be anxious.  He also complains of significant dyspnea on exertion.  He otherwise feels well. He has no neurologic complaints.  He denies any recent fevers or illnesses.  He does not complain of any cough or shortness of breath today.  He denies any hemoptysis or chest pain.  He has a good appetite and denies weight loss.  He denies any nausea, vomiting, constipation, or diarrhea.  He has no urinary complaints.  Patient offers no further specific complaints today.  REVIEW OF SYSTEMS:   Review of Systems  Constitutional: Negative.  Negative for fever, malaise/fatigue and weight loss.  HENT: Negative.  Negative for congestion.   Respiratory: Negative.  Negative for cough, hemoptysis and shortness of breath.   Cardiovascular: Negative.  Negative for chest pain and leg swelling.  Gastrointestinal: Negative.  Negative for abdominal pain and nausea.  Genitourinary: Negative.  Negative for dysuria.  Musculoskeletal: Negative.  Negative for back pain.  Skin: Negative for rash.  Neurological: Negative.  Negative for dizziness, focal weakness, weakness and headaches.  Endo/Heme/Allergies: Does not bruise/bleed easily.  Psychiatric/Behavioral: Negative for depression. The patient is  nervous/anxious.     As per HPI. Otherwise, a complete review of systems is negative.  PAST MEDICAL HISTORY: Past Medical History:  Diagnosis Date  . Asthma   . COPD (chronic obstructive pulmonary disease) ([place])   . Hearing loss   . Hypertension   . Hypothyroidism   . Mass of left lung   . 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 Prostate enlargement   . Shortness of breath   . Squamous cell carcinoma of lung, left ([place])   . Thyroid disease   . Tobacco abuse     PAST SURGICAL HISTORY: Past Surgical History:  Procedure Laterality Date  . ELECTROMAGNETIC NAVIGATION BROCHOSCOPY Left 10/19/2018   Procedure: ELECTROMAGNETIC NAVIGATION BRONCHOSCOPY LEFT;  Surgeon: Tyler Pita, MD;  Location: [facility] ORS;  Service: Cardiopulmonary;  Laterality: Left;  . ENDOBRONCHIAL ULTRASOUND Left 10/19/2018   Procedure: ENDOBRONCHIAL ULTRASOUND LEFT;  Surgeon: Tyler Pita, MD;  Location: [facility] ORS;  Service: Cardiopulmonary;  Laterality: Left;  Marland Kitchen MELANOMA EXCISION Left   . NO PAST SURGERIES      FAMILY HISTORY: Family History  Problem Relation Age of Onset  . Aneurysm Mother   . Cancer Father     ADVANCED DIRECTIVES (Y/N):  N  HEALTH MAINTENANCE: Social History   Tobacco Use  . Smoking status: Former Smoker    Packs/day: 0.25    Years: 62.00    Pack years: 15.50    Types: Cigarettes    Quit date: 09/05/2018    Years since quitting: 1.8  . Smokeless tobacco: Never Used  Vaping Use  . Vaping Use: Never used  Substance Use Topics  . Alcohol use: No    Alcohol/week: 0.0 standard drinks  . Drug use: No  Colonoscopy:  PAP:  Bone density:  Lipid panel:  No Known Allergies  Current Outpatient Medications  Medication Sig Dispense Refill  . albuterol (PROVENTIL HFA;VENTOLIN HFA) 108 (90 Base) MCG/ACT inhaler Inhale 2 puffs into the lungs every 6 (six) hours as needed for wheezing or shortness of breath. 1 Inhaler 0  . budesonide-formoterol (SYMBICORT) 160-4.5 MCG/ACT  inhaler Inhale 2 puffs into the lungs 2 (two) times daily.    Marland Kitchen diltiazem (DILACOR XR) 120 MG 24 hr capsule Take 120 mg by mouth daily.    Marland Kitchen levothyroxine (SYNTHROID) 137 MCG tablet Take 137 mcg by mouth daily before breakfast.    . tamsulosin (FLOMAX) 0.4 MG CAPS capsule Take 0.4 mg by mouth daily.     . vitamin B-12 (CYANOCOBALAMIN) 1000 MCG tablet Take 1,000 mcg by mouth daily.    . Vitamin D, Ergocalciferol, (DRISDOL) 1.25 MG (50000 UT) CAPS capsule Take 50,000 Units by mouth every 7 (seven) days.    Marland Kitchen apixaban (ELIQUIS) 5 MG TABS tablet Take 1 tablet (5 mg total) by mouth 2 (two) times daily. 180 tablet 3   No current facility-administered medications for this visit.    OBJECTIVE: Vitals:   07/16/20 0959  BP: (!) 142/85  Pulse: 77  Temp: 99 F (37.2 C)     Body mass index is 38.35 kg/m.    ECOG FS:0 - Asymptomatic  General: Well-developed, well-nourished, no acute distress. Eyes: Pink conjunctiva, anicteric sclera. HEENT: Normocephalic, moist mucous membranes. Lungs: No audible wheezing or coughing. Heart: Regular rate and rhythm. Abdomen: Soft, nontender, no obvious distention. Musculoskeletal: No edema, cyanosis, or clubbing. Neuro: Alert, answering all questions appropriately. Cranial nerves grossly intact. Skin: No rashes or petechiae noted. Psych: Normal affect.  LAB RESULTS:  Lab Results  Component Value Date   NA 139 07/18/2019   K 4.2 07/18/2019   CL 108 07/18/2019   CO2 23 07/18/2019   GLUCOSE 151 (H) 07/18/2019   BUN 19 07/18/2019   CREATININE 1.80 (H) 07/15/2020   CALCIUM 8.4 (L) 07/18/2019   PROT 6.4 (L) 07/18/2019   ALBUMIN 3.7 07/18/2019   AST 29 07/18/2019   ALT 35 07/18/2019   ALKPHOS 51 07/18/2019   BILITOT 0.8 07/18/2019   GFRNONAA 50 (L) 07/18/2019   GFRAA 58 (L) 07/18/2019    Lab Results  Component Value Date   WBC 5.8 07/18/2019   NEUTROABS 3.5 07/18/2019   HGB 14.6 07/18/2019   HCT 44.1 07/18/2019   MCV 93.6 07/18/2019   PLT 162  07/18/2019     STUDIES: CT CHEST W CONTRAST  Result Date: 07/15/2020 CLINICAL DATA:  Non-small-cell lung cancer.  Restaging. EXAM: CT CHEST WITH CONTRAST TECHNIQUE: Multidetector CT imaging of the chest was performed during intravenous contrast administration. CONTRAST:  92mL OMNIPAQUE IOHEXOL 300 MG/ML  SOLN COMPARISON:  01/17/2020 FINDINGS: Cardiovascular: The heart size is normal. No substantial pericardial effusion. Coronary artery calcification is evident. Atherosclerotic calcification is noted in the wall of the thoracic aorta. Mediastinum/Nodes: No mediastinal lymphadenopathy. No right hilar lymphadenopathy post treatment scarring again noted in the upper left hilum. The esophagus has normal imaging features. There is no axillary lymphadenopathy. Lungs/Pleura: Centrilobular emphsyema noted. Left upper lobe nodule measured previously at 10 mm is stable at 10 mm today (43/3). Interval progression of bandlike opacity in the peripheral left upper lobe with similar central left upper lobe suprahilar bronchiectasis and parenchymal scarring no new suspicious pulmonary nodule or mass. No pleural effusion. Upper Abdomen: Small exophytic cyst noted upper pole right kidney.  Musculoskeletal: No worrisome lytic or sclerotic osseous abnormality. IMPRESSION: 1. Interval progression of bandlike opacity in the peripheral left upper lobe compatible with evolving post treatment scar. Similar central left upper lobe suprahilar bronchiectasis and parenchymal scarring. 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 2. Stable left upper lobe pulmonary nodule. 3. No new or progressive findings to suggest recurrent or metastatic disease in the chest. 4. Aortic Atherosclerosis (ICD10-I70.0) and Emphysema (ICD10-J43.9). Electronically Signed   By: Misty Stanley M.D.   On: 07/15/2020 13:16    ASSESSMENT: Clinical stage IIB squamous cell carcinoma, left upper lobe lung.  PLAN:    1.Clinical stage IIB squamous  cell carcinoma, left upper lobe lung: PET scan results from October 08, 2018 reviewed independently.  Patient underwent biopsy with navigational bronchoscopy on October 19, 2018 confirming diagnosis.  Patient declined surgery and wished to pursue XRT along with concurrent chemotherapy.  Given his stage of disease he did not receive maintenance immunotherapy.  He completed cycle 7 of weekly carboplatinum and Taxol on January 09, 2019 and then completed XRT on January 14, 2019.  CT scan results from July 15, 2020 reviewed independently and reported as above with no obvious evidence of recurrent or progressive disease.  No intervention is needed at this time.  Return to clinic in 6 months with repeat imaging and further evaluation.  2.  History of melanoma: Unclear stage or depth, although by report was in situ.  Patient had Mohs surgery in fall 2019.  Lung biopsy consistent with squamous cell carcinoma. 3.  Anxiety/depression: Chronic and unchanged.  Continue current medications as prescribed.  Continue follow-up with primary care physician as well as psychiatry at the [place]. 4.  Renal insufficiency: Chronic and unchanged.  Patient's most recent creatinine is 1.80.  Continue monitoring and treatment per primary care at the [facility]. 5.  Dyspnea on exertion: Will refer patient to care program and/or pulmonary rehab. 6.  Weakness and fatigue: Multifactorial.  Patient expressed understanding and was in agreement with this plan. He also understands that He can call clinic at any time with any questions, concerns, or complaints.   Cancer Staging Squamous cell carcinoma lung, left ([place]) Staging form: Lung, AJCC 8th Edition - Clinical stage from 10/27/2018: Stage IIB (cT1c, cN1, cM0) - Signed by Lloyd Huger, MD on 11/16/2018   Lloyd Huger, MD   07/16/2020 10:38 AM

## 2020-07-16 NOTE — Progress Notes (Signed)
Radiation Oncology Follow up Note  Name: Levi [name]   Date:   07/16/2020 MRN:  [id number] DOB: 1940/08/26    This 80 y.o. 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  He is currently on nebulizers as well as inhalers.  Being followed at the [place].  He is not seen a pulmonologist in a while.  He has a mild nonproductive cough no hemoptysis or chest tightness.  Patient had a recent CT scan.  Showing interval progression of bandlike opacity in the peripheral left upper lobe compatible with evolving post treatment scarring.  No evidence of new or progressive findings were noted.  COMPLICATIONS OF TREATMENT: none  FOLLOW UP COMPLIANCE: keeps appointments   PHYSICAL EXAM:  There were no vitals taken for this visit. Well-developed well-nourished patient in NAD. HEENT reveals PERLA, EOMI, discs not visualized.  Oral cavity is clear. No oral mucosal lesions are identified. Neck is clear without evidence of cervical or supraclavicular adenopathy. Lungs are clear to A&P. Cardiac examination is essentially unremarkable with regular rate and rhythm without murmur rub or thrill. Abdomen is benign with no organomegaly or masses noted. Motor sensory and DTR levels are equal and symmetric in the upper and lower extremities. Cranial nerves II through XII are grossly intact. Proprioception is intact. No peripheral adenopathy or edema is identified. No motor or sensory levels are noted. Crude visual fields are within normal range.  RADIOLOGY RESULTS: CT scan reviewed compatible with above-stated findings  PLAN: Present time  I have suggested some Mucinex to help with his mild cough.  I have also requesting the [facility] [facility] refer him to pulmonology for pulmonary tuneup. I am otherwise pleased with his overall progress.  He continues close follow-up care with medical oncology.  I have asked to see him back in 1 year for follow-up.  Patient knows to call with any concerns.   I would like to take this opportunity to thank you for allowing me to participate in the care of your patient.Noreene Filbert, MD

## 2020-07-29 ENCOUNTER — Inpatient Hospital Stay: Payer: Medicare Other | Admitting: Occupational Therapy

## 2020-07-29 DIAGNOSIS — R5383 Other fatigue: Secondary | ICD-10-CM

## 2020-07-29 NOTE — Therapy (Signed)
[facility] [address] [place], [place], [place], [address] Phone: [phone]   Fax:  [phone]  Occupational Therapy Screen  Patient Details  Name: HESTER FORGET MRN: [id number] Date of Birth: January 01, 1941 No data recorded  Encounter Date: 07/29/2020   OT End of Session - 07/29/20 1910    Visit Number 0           Past Medical History:  Diagnosis Date  . Asthma   . COPD (chronic obstructive pulmonary disease) ([place])   . Hearing loss   . Hypertension   . Hypothyroidism   . Mass of left lung   . 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 Prostate enlargement   . Shortness of breath   . Squamous cell carcinoma of lung, left ([place])   . Thyroid disease   . Tobacco abuse     Past Surgical History:  Procedure Laterality Date  . ELECTROMAGNETIC NAVIGATION BROCHOSCOPY Left 10/19/2018   Procedure: ELECTROMAGNETIC NAVIGATION BRONCHOSCOPY LEFT;  Surgeon: Tyler Pita, MD;  Location: [facility] ORS;  Service: Cardiopulmonary;  Laterality: Left;  . ENDOBRONCHIAL ULTRASOUND Left 10/19/2018   Procedure: ENDOBRONCHIAL ULTRASOUND LEFT;  Surgeon: Tyler Pita, MD;  Location: [facility] ORS;  Service: Cardiopulmonary;  Laterality: Left;  Marland Kitchen MELANOMA EXCISION Left   . NO PAST SURGERIES     ASSESSMENT of visit on 07/16/20 with DR Grayland Ormond Clinical stage IIB squamous cell carcinoma, left upper lobe lung.  PLAN:    1.Clinical stage IIB squamous cell carcinoma, left upper lobe lung: PET scan results from October 08, 2018 reviewed independently.  Patient underwent biopsy with navigational bronchoscopy on October 19, 2018 confirming diagnosis.  Patient declined surgery and wished to pursue XRT along with concurrent chemotherapy.  Given his stage of disease he did not receive maintenance immunotherapy.  He completed cycle 7 of weekly carboplatinum and Taxol on January 09, 2019 and then completed XRT on January 14, 2019.  CT scan results from July 15, 2020 reviewed independently and reported as above with no obvious evidence of recurrent or progressive disease.  No intervention is needed at this time.  Return to clinic in 6 months with repeat imaging and further evaluation.  2.  History of melanoma: Unclear stage or depth, although by report was in situ.  Patient had Mohs surgery in fall 2019.  Lung biopsy consistent with squamous cell carcinoma. 3.  Anxiety/depression: Chronic and unchanged.  Continue current medications as prescribed.  Continue follow-up with primary care physician as well as psychiatry at the [place]. 4.  Renal insufficiency: Chronic and unchanged.  Patient's most recent creatinine is 1.80.  Continue monitoring and treatment per primary care at the [facility]. 5.  Dyspnea on exertion: Will refer patient to care program and/or pulmonary rehab. 6.  Weakness and fatigue: Multifactorial.     Subjective Assessment - 07/29/20 1909    Subjective  I was strong before this cancer and still feel legs are strong but I just give out, get so SOB and sit most of the time and watch tv, walk to bathroom - wife brings my food on tray at tv    Currently in Pain? No/denies             Pt present at OT screen walking in clinic with wife.  Upon arrival - O2 sats 88 , HR 77- after sitting and purse lip breathing O2 increase to 91 and HR 24 Pt lives with wife - one story  house- sit and watch tv most of the time and only up to bathroom and sometimes will get drink in kitchen. Tub shower combo- no chair or railing Need rest breaks and assistance with foot wear Pt denies falls And show strength in hip flexion , knees ext and flexion , ankle 5/5 BERG balance test 46/56 - low risk for fall - but during test pt can do about 3-4 tasks and need rest break -O2 drop to 88% - taught pt purse lip breathing and no holding breath Recover in 2-5 min to 93%  Ed pt on HEP -and discuss Pulmonary rehab and CARE Program  Will contact - supervisor checking on  insurance and eligibility with him being [facility] HEP in the meantime provided and education - pt wants to do it for 4 wks: Sit<> stand 6-10 reps  Side stepping at kitchen counter - 1-2 min  Back ward and forward walking at kitchen counter 1-2 min - WIFE TO BE PRESENT TO GUARD Walk 2-5 min gradually increase time  Can do hip ext and ABD - 10 reps at kitchen counter - Monitor O2 and HR - keep above 90%- if decrease- sit - do breathing -if increase - cont  150 min exercise week- divide in about 22 min day and divide that in 4 x 5-7 min  Pt and wife agree- will return in 4 wks                                  Patient will benefit from skilled therapeutic intervention in order to improve the following deficits and impairments:           Visit Diagnosis: Fatigue, unspecified type    Problem List Patient Active Problem List   Diagnosis Date Noted  . Depression 02/01/2019  . Goals of care, counseling/discussion 11/25/2018  . Squamous cell carcinoma lung, left ([place]) 10/27/2018  . Nodule of upper lobe of left lung 10/20/2018  . COPD (chronic obstructive pulmonary disease) with emphysema ([place]) 10/23/2015  . 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

## 2020-07-31 ENCOUNTER — Telehealth: Payer: Self-pay | Admitting: *Deleted

## 2020-07-31 NOTE — Telephone Encounter (Signed)
Requested information has been faxed.

## 2020-07-31 NOTE — Telephone Encounter (Signed)
Call from [facility] at [facility] stating that patient was referred for Pulmonary Rehab and she is asking that we send them the referral along with notes to document why her needs this Fax to [phone]

## 2020-08-19 ENCOUNTER — Telehealth (HOSPITAL_COMMUNITY): Payer: Self-pay | Admitting: *Deleted

## 2020-08-19 NOTE — Telephone Encounter (Signed)
Received VA authorization for this pt to participate in Pulmonary rehab.  Called and spoke to both pt and his wife Levi [name].  Prefer to participate at [facility] where he has other medical appt.  Will fax this authorization to [facility] and notify staff. Cherre Huger, BSN Cardiac and Training and development officer

## 2020-08-26 ENCOUNTER — Inpatient Hospital Stay: Payer: No Typology Code available for payment source | Attending: Oncology | Admitting: Occupational Therapy

## 2020-08-26 DIAGNOSIS — R5383 Other fatigue: Secondary | ICD-10-CM

## 2020-08-26 NOTE — Therapy (Signed)
[facility] [address] [place], [place], [place], [address] Phone: [phone]   Fax:  [phone]  Occupational Therapy Screen:  Patient Details  Name: Levi [name] MRN: [id number] Date of Birth: [birth year] No data recorded  Encounter Date: 08/26/2020   OT End of Session - 08/26/20 1742    Visit Number 0           Past Medical History:  Diagnosis Date  . Asthma   . COPD (chronic obstructive pulmonary disease) ([place])   . Hearing loss   . Hypertension   . Hypothyroidism   . Mass of left lung   . 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 Prostate enlargement   . Shortness of breath   . Squamous cell carcinoma of lung, left ([place])   . Thyroid disease   . Tobacco abuse     Past Surgical History:  Procedure Laterality Date  . ELECTROMAGNETIC NAVIGATION BROCHOSCOPY Left 10/19/2018   Procedure: ELECTROMAGNETIC NAVIGATION BRONCHOSCOPY LEFT;  Surgeon: Tyler Pita, MD;  Location: [facility] ORS;  Service: Cardiopulmonary;  Laterality: Left;  . ENDOBRONCHIAL ULTRASOUND Left 10/19/2018   Procedure: ENDOBRONCHIAL ULTRASOUND LEFT;  Surgeon: Tyler Pita, MD;  Location: [facility] ORS;  Service: Cardiopulmonary;  Laterality: Left;  Marland Kitchen MELANOMA EXCISION Left   . NO PAST SURGERIES      There were no vitals filed for this visit.   Subjective Assessment - 08/26/20 1741    Subjective  I feel little better- done the exercises you gave me about 3 x day and my O2 % stayed up about 91 to 94 at times - I did hear back from Pulmonary Rehab - think starting next week                OT SCREEN 07/29/20:  Pt present at OT screen walking in clinic with wife.  Upon arrival - O2 sats 88 , HR 77- after sitting and purse lip breathing O2 increase to 91 and HR 77 Pt lives with wife - one story house- sit and watch tv most of the time and only up to bathroom and sometimes will get drink in kitchen. Tub shower combo- no chair or railing Need  rest breaks and assistance with foot wear Pt denies falls And show strength in hip flexion , knees ext and flexion , ankle 5/5 BERG balance test 46/56 - low risk for fall - but during test pt can do about 3-4 tasks and need rest break -O2 drop to 88% - taught pt purse lip breathing and no holding breath Recover in 2-5 min to 93%  Ed pt on HEP -and discuss Pulmonary rehab and CARE Program  Will contact - supervisor checking on insurance and eligibility with him being [facility] HEP in the meantime provided and education - pt wants to do it for 4 wks: Sit<> stand 6-10 reps  Side stepping at kitchen counter - 1-2 min  Back ward and forward walking at kitchen counter 1-2 min - WIFE TO BE PRESENT TO GUARD Walk 2-5 min gradually increase time  Can do hip ext and ABD - 10 reps at kitchen counter - Monitor O2 and HR - keep above 90%- if decrease- sit - do breathing -if increase - cont  150 min exercise week- divide in about 22 min day and divide that in 4 x 5-7 min  Pt and wife agree- will return in 4 wks     OT SCREEN 08/26/20:  Pt present at OT screen walking in clinic with wife.  Upon arrival - O2 sats 88 , HR 84-  But walked in from parking lot  After sitting and purse lip breathing O2 increase to 91 and HR 82 Pt lives with wife - one story house- sit and watch tv most of the time and only up to bathroom and sometimes will get drink in kitchen. Tub shower combo- no chair or railing Need rest breaks and assistance with foot wear Pt denies falls And show strength in hip flexion , knees ext and flexion , ankle 5/5 More confidence in motion - and during testing compare to last time   BERG balance test 46/56 last time - increase to 50 this date - cont to be low risk for fall - but during test pt can do about 3-4 tasks and need rest break -O2 drop to 88% - taught pt purse lip breathing and no holding breath Recover in 2-5 min to 91%   Pt did get in with Pulmonary rehab- starting next week  Pt to cont  with HEP in the meantimen  Sit<> stand 10 reps  Side stepping at kitchen counter - 2 min  Back ward and forward walking at kitchen counter 2 min - WIFE TO BE PRESENT TO GUARD Walk 2-5 min gradually increase time  Hip ext and ABD - 10 reps at kitchen counter -did not do them  Heel raises- 10 reps   Monitor O2 and HR - keep above 90%- if decrease- sit - do breathing - pt to do 4 activities at a time -done 2 the last few wks 150 min exercise week- divide in about 23 min day and divide that in 4 x 5-7 min  Pt to start Pulmonary rehab                            Patient will benefit from skilled therapeutic intervention in order to improve the following deficits and impairments:           Visit Diagnosis: Fatigue, unspecified type    Problem List Patient Active Problem List   Diagnosis Date Noted  . Depression 02/01/2019  . Goals of care, counseling/discussion 11/25/2018  . Squamous cell carcinoma lung, left ([place]) 10/27/2018  . Nodule of upper lobe of left lung 10/20/2018  . COPD (chronic obstructive pulmonary disease) with emphysema ([place]) 10/23/2015  . 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 [place], [place], [place], [address] Phone: [phone]   Fax:  [phone]  Name: Levi [name] MRN: [id number] Date of Birth: 03-08-1941

## 2020-08-28 ENCOUNTER — Other Ambulatory Visit: Payer: Self-pay

## 2020-08-28 ENCOUNTER — Encounter: Payer: No Typology Code available for payment source | Attending: Internal Medicine | Admitting: *Deleted

## 2020-08-28 DIAGNOSIS — E039 Hypothyroidism, unspecified: Secondary | ICD-10-CM | POA: Insufficient documentation

## 2020-08-28 DIAGNOSIS — F064 Anxiety disorder due to known physiological condition: Secondary | ICD-10-CM | POA: Insufficient documentation

## 2020-08-28 DIAGNOSIS — C3490 Malignant neoplasm of unspecified part of unspecified bronchus or lung: Secondary | ICD-10-CM

## 2020-08-28 DIAGNOSIS — I480 Paroxysmal atrial fibrillation: Secondary | ICD-10-CM | POA: Insufficient documentation

## 2020-08-28 DIAGNOSIS — J45909 Unspecified asthma, uncomplicated: Secondary | ICD-10-CM | POA: Insufficient documentation

## 2020-08-28 NOTE — Progress Notes (Signed)
Initial telephone orientation completed. Diagnosis can be found in [facility] 3/17. EP orientation scheduled for 5/2 at 10:30am.

## 2020-08-31 ENCOUNTER — Other Ambulatory Visit: Payer: Self-pay

## 2020-08-31 ENCOUNTER — Encounter: Payer: No Typology Code available for payment source | Attending: Internal Medicine

## 2020-08-31 VITALS — Ht 70.75 in | Wt 271.6 lb

## 2020-08-31 DIAGNOSIS — C3490 Malignant neoplasm of unspecified part of unspecified bronchus or lung: Secondary | ICD-10-CM | POA: Diagnosis not present

## 2020-08-31 NOTE — Patient Instructions (Signed)
Patient Instructions  Patient Details  Name: Levi [name] MRN: [id number] Date of Birth: January 16, 1941 Referring Provider:  [name], MD  Below are your personal goals for exercise, nutrition, and risk factors. Our goal is to help you stay on track towards obtaining and maintaining these goals. We will be discussing your progress on these goals with you throughout the program.  Initial Exercise Prescription:  Initial Exercise Prescription - 08/31/20 1200      Date of Initial Exercise RX and Referring Provider   Date 08/31/20    Referring Provider [name] MD      Recumbant Bike   Level 1    RPM 60    Watts 10    Minutes 15    METs 1      NuStep   Level 1    SPM 80    Minutes 15    METs 1      Track   Laps 10    Minutes 15    METs 1      Prescription Details   Frequency (times per week) 2    Duration Progress to 30 minutes of continuous aerobic without signs/symptoms of physical distress      Intensity   THRR 40-80% of Max Heartrate 106-130    Ratings of Perceived Exertion 11-13    Perceived Dyspnea 0-4      Progression   Progression Continue to progress workloads to maintain intensity without signs/symptoms of physical distress.      Resistance Training   Training Prescription Yes    Weight 3 lb    Reps 10-15           Exercise Goals: Frequency: Be able to perform aerobic exercise two to three times per week in program working toward 2-5 days per week of home exercise.  Intensity: Work with a perceived exertion of 11 (fairly light) - 15 (hard) while following your exercise prescription.  We will make changes to your prescription with you as you progress through the program.   Duration: Be able to do 30 to 45 minutes of continuous aerobic exercise in addition to a 5 minute warm-up and a 5 minute cool-down routine.   Nutrition Goals: Your personal nutrition goals will be established when you do your nutrition analysis with the dietician.  The  following are general nutrition guidelines to follow: Cholesterol < 200mg /day Sodium < 1500mg /day Fiber: Men over 50 yrs - 30 grams per day  Personal Goals:  Personal Goals and Risk Factors at Admission - 08/31/20 1255      Core Components/Risk Factors/Patient Goals on Admission    Weight Management Yes;Weight Loss    Intervention Weight Management: Develop a combined nutrition and exercise program designed to reach desired caloric intake, while maintaining appropriate intake of nutrient and fiber, sodium and fats, and appropriate energy expenditure required for the weight goal.;Weight Management: Provide education and appropriate resources to help participant work on and attain dietary goals.;Weight Management/Obesity: Establish reasonable short term and long term weight goals.    Admit Weight 271 lb (122.9 kg)    Goal Weight: Short Term 265 lb (120.2 kg)    Goal Weight: Long Term 250 lb (113.4 kg)    Expected Outcomes Short Term: Continue to assess and modify interventions until short term weight is achieved;Long Term: Adherence to nutrition and physical activity/exercise program aimed toward attainment of established weight goal;Weight Loss: Understanding of general recommendations for a balanced deficit meal plan, which promotes 1-2 lb weight loss  per week and includes a negative energy balance of [phone] kcal/d;Understanding recommendations for meals to include 15-35% energy as protein, 25-35% energy from fat, 35-60% energy from carbohydrates, less than 200mg  of dietary cholesterol, 20-35 gm of total fiber daily;Understanding of distribution of calorie intake throughout the day with the consumption of 4-5 meals/snacks    Improve shortness of breath with ADL's Yes    Intervention Provide education, individualized exercise plan and daily activity instruction to help decrease symptoms of SOB with activities of daily living.    Expected Outcomes Short Term: Improve cardiorespiratory fitness to  achieve a reduction of symptoms when performing ADLs;Long Term: Be able to perform more ADLs without symptoms or delay the onset of symptoms    Hypertension Yes    Intervention Provide education on lifestyle modifcations including regular physical activity/exercise, weight management, moderate sodium restriction and increased consumption of fresh fruit, vegetables, and low fat dairy, alcohol moderation, and smoking cessation.;Monitor prescription use compliance.    Expected Outcomes Short Term: Continued assessment and intervention until BP is < 140/62mm HG in hypertensive participants. < 130/49mm HG in hypertensive participants with diabetes, heart failure or chronic kidney disease.;Long Term: Maintenance of blood pressure at goal levels.           Tobacco Use Initial Evaluation: Social History   Tobacco Use  Smoking Status Former Smoker  . Packs/day: 0.25  . Years: 62.00  . Pack years: 15.50  . Types: Cigarettes  . Quit date: 09/05/2018  . Years since quitting: 1.9  Smokeless Tobacco Never Used    Exercise Goals and Review:  Exercise Goals    Row Name 08/31/20 1254             Exercise Goals   Increase Physical Activity Yes       Intervention Provide advice, education, support and counseling about physical activity/exercise needs.;Develop an individualized exercise prescription for aerobic and resistive training based on initial evaluation findings, risk stratification, comorbidities and participant's personal goals.       Expected Outcomes Short Term: Attend rehab on a regular basis to increase amount of physical activity.;Long Term: Add in home exercise to make exercise part of routine and to increase amount of physical activity.;Long Term: Exercising regularly at least 3-5 days a week.       Increase Strength and Stamina Yes       Intervention Provide advice, education, support and counseling about physical activity/exercise needs.;Develop an individualized exercise  prescription for aerobic and resistive training based on initial evaluation findings, risk stratification, comorbidities and participant's personal goals.       Expected Outcomes Short Term: Increase workloads from initial exercise prescription for resistance, speed, and METs.;Short Term: Perform resistance training exercises routinely during rehab and add in resistance training at home;Long Term: Improve cardiorespiratory fitness, muscular endurance and strength as measured by increased METs and functional capacity (6MWT)       Able to understand and use rate of perceived exertion (RPE) scale Yes       Intervention Provide education and explanation on how to use RPE scale       Expected Outcomes Short Term: Able to use RPE daily in rehab to express subjective intensity level;Long Term:  Able to use RPE to guide intensity level when exercising independently       Able to understand and use Dyspnea scale Yes       Intervention Provide education and explanation on how to use Dyspnea scale       Expected  Outcomes Short Term: Able to use Dyspnea scale daily in rehab to express subjective sense of shortness of breath during exertion;Long Term: Able to use Dyspnea scale to guide intensity level when exercising independently       Knowledge and understanding of Target Heart Rate Range (THRR) Yes       Intervention Provide education and explanation of THRR including how the numbers were predicted and where they are located for reference       Expected Outcomes Short Term: Able to state/look up THRR;Short Term: Able to use daily as guideline for intensity in rehab;Long Term: Able to use THRR to govern intensity when exercising independently       Able to check pulse independently Yes       Intervention Provide education and demonstration on how to check pulse in carotid and radial arteries.;Review the importance of being able to check your own pulse for safety during independent exercise       Expected Outcomes  Short Term: Able to explain why pulse checking is important during independent exercise;Long Term: Able to check pulse independently and accurately       Understanding of Exercise Prescription Yes       Intervention Provide education, explanation, and written materials on patient's individual exercise prescription       Expected Outcomes Short Term: Able to explain program exercise prescription;Long Term: Able to explain home exercise prescription to exercise independently              Copy of goals given to participant.

## 2020-08-31 NOTE — Progress Notes (Signed)
Pulmonary Individual Treatment Plan  Patient Details  Name: URBAN NAVAL MRN: [id number] Date of Birth: 12/25/1940 Referring Provider:   Flowsheet Row Pulmonary Rehab from 08/31/2020 in [facility] Pulmonary Rehab  Referring Provider [name] MD      Initial Encounter Date:  Flowsheet Row Pulmonary Rehab from 08/31/2020 in [facility] Cardiac and Pulmonary Rehab  Date 08/31/20      Visit Diagnosis: Malignant neoplasm of lung, unspecified laterality, unspecified part of lung ([place])  Patient's Home Medications on Admission:  Current Outpatient Medications:  .  albuterol (PROVENTIL HFA;VENTOLIN HFA) 108 (90 Base) MCG/ACT inhaler, Inhale 2 puffs into the lungs every 6 (six) hours as needed for wheezing or shortness of breath., Disp: 1 Inhaler, Rfl: 0 .  apixaban (ELIQUIS) 5 MG TABS tablet, Take 1 tablet (5 mg total) by mouth 2 (two) times daily., Disp: 180 tablet, Rfl: 3 .  budesonide-formoterol (SYMBICORT) 160-4.5 MCG/ACT inhaler, Inhale 2 puffs into the lungs 2 (two) times daily., Disp: , Rfl:  .  diltiazem (DILACOR XR) 120 MG 24 hr capsule, Take 120 mg by mouth daily., Disp: , Rfl:  .  levothyroxine (SYNTHROID) 137 MCG tablet, Take 137 mcg by mouth daily before breakfast., Disp: , Rfl:  .  tamsulosin (FLOMAX) 0.4 MG CAPS capsule, Take 0.4 mg by mouth daily. , Disp: , Rfl:  .  vitamin B-12 (CYANOCOBALAMIN) 1000 MCG tablet, Take 1,000 mcg by mouth daily., Disp: , Rfl:  .  Vitamin D, Ergocalciferol, (DRISDOL) 1.25 MG (50000 UT) CAPS capsule, Take 50,000 Units by mouth every 7 (seven) days., Disp: , Rfl:   Past Medical History: Past Medical History:  Diagnosis Date  . Asthma   . COPD (chronic obstructive pulmonary disease) ([place])   . Hearing loss   . Hypertension   . Hypothyroidism   . Mass of left lung   . 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 Prostate enlargement   . Shortness of breath   . Squamous cell carcinoma of lung, left ([place])   . Thyroid disease   . Tobacco abuse      Tobacco Use: Social History   Tobacco Use  Smoking Status Former Smoker  . Packs/day: 0.25  . Years: 62.00  . Pack years: 15.50  . Types: Cigarettes  . Quit date: 09/05/2018  . Years since quitting: 1.9  Smokeless Tobacco Never Used    Labs: Recent Review Scientist, physiological    Labs for ITP Cardiac and Pulmonary Rehab Latest Ref Rng & Units 10/15/2018   PHART 7.350 - 7.450 7.42   PCO2ART 32.0 - 48.0 mmHg 35   HCO3 20.0 - 28.0 mmol/L 22.7   ACIDBASEDEF 0.0 - 2.0 mmol/L 1.3   O2SAT % 93.1       Pulmonary Assessment Scores:  Pulmonary Assessment Scores    Row Name 08/31/20 1237         ADL UCSD   ADL Phase Entry     SOB Score total 53     Rest 0     Walk 2     Stairs 4     Bath 3     Dress 3     Shop 2           CAT Score   CAT Score 26           mMRC Score   mMRC Score 3            UCSD: Self-administered rating of dyspnea associated with activities of  daily living (ADLs) 6-point scale (0 = "not at all" to 5 = "maximal or unable to do because of breathlessness")  Scoring Scores range from 0 to 120.  Minimally important difference is 5 units  CAT: CAT can identify the health impairment of COPD patients and is better correlated with disease progression.  CAT has a scoring range of zero to 40. The CAT score is classified into four groups of low (less than 10), medium (10 - 20), high (21-30) and very high (31-40) based on the impact level of disease on health status. A CAT score over 10 suggests significant symptoms.  A worsening CAT score could be explained by an exacerbation, poor medication adherence, poor inhaler technique, or progression of COPD or comorbid conditions.  CAT MCID is 2 points  mMRC: mMRC (Modified Medical Research Council) Dyspnea Scale is used to assess the degree of baseline functional disability in patients of respiratory disease due to dyspnea. No minimal important difference is established. A decrease in score of 1 point or greater is  considered a positive change.   Pulmonary Function Assessment:   Exercise Target Goals: Exercise Program Goal: Individual exercise prescription set using results from initial 6 min walk test and THRR while considering  patient's activity barriers and safety.   Exercise Prescription Goal: Initial exercise prescription builds to 30-45 minutes a day of aerobic activity, 2-3 days per week.  Home exercise guidelines will be given to patient during program as part of exercise prescription that the participant will acknowledge.  Education: Aerobic Exercise: - Group verbal and visual presentation on the components of exercise prescription. Introduces F.I.T.T principle from ACSM for exercise prescriptions.  Reviews F.I.T.T. principles of aerobic exercise including progression. Written material given at graduation.   Education: Resistance Exercise: - Group verbal and visual presentation on the components of exercise prescription. Introduces F.I.T.T principle from ACSM for exercise prescriptions  Reviews F.I.T.T. principles of resistance exercise including progression. Written material given at graduation.    Education: Exercise & Equipment Safety: - Individual verbal instruction and demonstration of equipment use and safety with use of the equipment. Flowsheet Row Pulmonary Rehab from 08/31/2020 in [facility] Cardiac and Pulmonary Rehab  Education need identified 08/31/20  Date 08/31/20  Educator [place]  Instruction Review Code 1- Verbalizes Understanding      Education: Exercise Physiology & General Exercise Guidelines: - Group verbal and written instruction with models to review the exercise physiology of the cardiovascular system and associated critical values. Provides general exercise guidelines with specific guidelines to those with heart or lung disease.    Education: Flexibility, Balance, Mind/Body Relaxation: - Group verbal and visual presentation with interactive activity on the components of  exercise prescription. Introduces F.I.T.T principle from ACSM for exercise prescriptions. Reviews F.I.T.T. principles of flexibility and balance exercise training including progression. Also discusses the mind body connection.  Reviews various relaxation techniques to help reduce and manage stress (i.e. Deep breathing, progressive muscle relaxation, and visualization). Balance handout provided to take home. Written material given at graduation.   Activity Barriers & Risk Stratification:  Activity Barriers & Cardiac Risk Stratification - 08/31/20 1234      Activity Barriers & Cardiac Risk Stratification   Activity Barriers Shortness of Breath;Deconditioning;Muscular Weakness;Other (comment)    Comments Sciatica           6 Minute Walk:  6 Minute Walk    Row Name 08/31/20 1240         6 Minute Walk   Phase Initial  Distance 780 feet     Walk Time 6 minutes     # of Rest Breaks 0     MPH 1.47     METS 1.08     RPE 13     Perceived Dyspnea  1     VO2 Peak 3.81     Symptoms Yes (comment)     Comments Lower back pain 7/10     Resting HR 82 bpm     Resting BP 132/78     Resting Oxygen Saturation  94 %     Exercise Oxygen Saturation  during 6 min walk 86 %     Max Ex. HR 101 bpm     Max Ex. BP 144/76     2 Minute Post BP 124/78           Interval HR   1 Minute HR 93     2 Minute HR 97     3 Minute HR 98     4 Minute HR 99     5 Minute HR 101     6 Minute HR 100     2 Minute Post HR 87     Interval Heart Rate? Yes           Interval Oxygen   Interval Oxygen? Yes     Baseline Oxygen Saturation % 94 %     1 Minute Oxygen Saturation % 90 %     1 Minute Liters of Oxygen 0 L  RA     2 Minute Oxygen Saturation % 87 %     2 Minute Liters of Oxygen 0 L     3 Minute Oxygen Saturation % 86 %     3 Minute Liters of Oxygen 0 L     4 Minute Oxygen Saturation % 87 %     4 Minute Liters of Oxygen 0 L     5 Minute Oxygen Saturation % 86 %     5 Minute Liters of Oxygen 0 L      6 Minute Oxygen Saturation % 88 %     6 Minute Liters of Oxygen 0 L     2 Minute Post Oxygen Saturation % 94 %     2 Minute Post Liters of Oxygen 0 L           Oxygen Initial Assessment:  Oxygen Initial Assessment - 08/31/20 1237      Home Oxygen   Home Oxygen Device None    Sleep Oxygen Prescription None    Home Exercise Oxygen Prescription None    Home Resting Oxygen Prescription None      Initial 6 min Walk   Oxygen Used None      Program Oxygen Prescription   Program Oxygen Prescription None      Intervention   Short Term Goals To learn and understand importance of monitoring SPO2 with pulse oximeter and demonstrate accurate use of the pulse oximeter.;To learn and understand importance of maintaining oxygen saturations>88%;To learn and demonstrate proper pursed lip breathing techniques or other breathing techniques.;To learn and demonstrate proper use of respiratory medications    Long  Term Goals Verbalizes importance of monitoring SPO2 with pulse oximeter and return demonstration;Maintenance of O2 saturations>88%;Exhibits proper breathing techniques, such as pursed lip breathing or other method taught during program session;Compliance with respiratory medication;Demonstrates proper use of MDI's           Oxygen Re-Evaluation:   Oxygen Discharge (Final Oxygen Re-Evaluation):  Initial Exercise Prescription:  Initial Exercise Prescription - 08/31/20 1200      Date of Initial Exercise RX and Referring Provider   Date 08/31/20    Referring Provider [name] MD      Recumbant Bike   Level 1    RPM 60    Watts 10    Minutes 15    METs 1      NuStep   Level 1    SPM 80    Minutes 15    METs 1      Track   Laps 10    Minutes 15    METs 1      Prescription Details   Frequency (times per week) 2    Duration Progress to 30 minutes of continuous aerobic without signs/symptoms of physical distress      Intensity   THRR 40-80% of Max Heartrate  106-130    Ratings of Perceived Exertion 11-13    Perceived Dyspnea 0-4      Progression   Progression Continue to progress workloads to maintain intensity without signs/symptoms of physical distress.      Resistance Training   Training Prescription Yes    Weight 3 lb    Reps 10-15           Perform Capillary Blood Glucose checks as needed.  Exercise Prescription Changes:  Exercise Prescription Changes    Row Name 08/31/20 1200             Response to Exercise   Blood Pressure (Admit) 132/78       Blood Pressure (Exercise) 144/76       Blood Pressure (Exit) 124/78       Heart Rate (Admit) 82 bpm       Heart Rate (Exercise) 101 bpm       Heart Rate (Exit) 87 bpm       Oxygen Saturation (Admit) 94 %       Oxygen Saturation (Exercise) 86 %       Oxygen Saturation (Exit) 94 %       Rating of Perceived Exertion (Exercise) 13       Perceived Dyspnea (Exercise) 1       Symptoms Back pain 7/10, SOB       Comments walk test results              Exercise Comments:   Exercise Goals and Review:  Exercise Goals    Row Name 08/31/20 1254             Exercise Goals   Increase Physical Activity Yes       Intervention Provide advice, education, support and counseling about physical activity/exercise needs.;Develop an individualized exercise prescription for aerobic and resistive training based on initial evaluation findings, risk stratification, comorbidities and participant's personal goals.       Expected Outcomes Short Term: Attend rehab on a regular basis to increase amount of physical activity.;Long Term: Add in home exercise to make exercise part of routine and to increase amount of physical activity.;Long Term: Exercising regularly at least 3-5 days a week.       Increase Strength and Stamina Yes       Intervention Provide advice, education, support and counseling about physical activity/exercise needs.;Develop an individualized exercise prescription for aerobic and  resistive training based on initial evaluation findings, risk stratification, comorbidities and participant's personal goals.       Expected Outcomes Short Term: Increase workloads from initial exercise prescription  for resistance, speed, and METs.;Short Term: Perform resistance training exercises routinely during rehab and add in resistance training at home;Long Term: Improve cardiorespiratory fitness, muscular endurance and strength as measured by increased METs and functional capacity (6MWT)       Able to understand and use rate of perceived exertion (RPE) scale Yes       Intervention Provide education and explanation on how to use RPE scale       Expected Outcomes Short Term: Able to use RPE daily in rehab to express subjective intensity level;Long Term:  Able to use RPE to guide intensity level when exercising independently       Able to understand and use Dyspnea scale Yes       Intervention Provide education and explanation on how to use Dyspnea scale       Expected Outcomes Short Term: Able to use Dyspnea scale daily in rehab to express subjective sense of shortness of breath during exertion;Long Term: Able to use Dyspnea scale to guide intensity level when exercising independently       Knowledge and understanding of Target Heart Rate Range (THRR) Yes       Intervention Provide education and explanation of THRR including how the numbers were predicted and where they are located for reference       Expected Outcomes Short Term: Able to state/look up THRR;Short Term: Able to use daily as guideline for intensity in rehab;Long Term: Able to use THRR to govern intensity when exercising independently       Able to check pulse independently Yes       Intervention Provide education and demonstration on how to check pulse in carotid and radial arteries.;Review the importance of being able to check your own pulse for safety during independent exercise       Expected Outcomes Short Term: Able to explain  why pulse checking is important during independent exercise;Long Term: Able to check pulse independently and accurately       Understanding of Exercise Prescription Yes       Intervention Provide education, explanation, and written materials on patient's individual exercise prescription       Expected Outcomes Short Term: Able to explain program exercise prescription;Long Term: Able to explain home exercise prescription to exercise independently              Exercise Goals Re-Evaluation :   Discharge Exercise Prescription (Final Exercise Prescription Changes):  Exercise Prescription Changes - 08/31/20 1200      Response to Exercise   Blood Pressure (Admit) 132/78    Blood Pressure (Exercise) 144/76    Blood Pressure (Exit) 124/78    Heart Rate (Admit) 82 bpm    Heart Rate (Exercise) 101 bpm    Heart Rate (Exit) 87 bpm    Oxygen Saturation (Admit) 94 %    Oxygen Saturation (Exercise) 86 %    Oxygen Saturation (Exit) 94 %    Rating of Perceived Exertion (Exercise) 13    Perceived Dyspnea (Exercise) 1    Symptoms Back pain 7/10, SOB    Comments walk test results           Nutrition:  Target Goals: Understanding of nutrition guidelines, daily intake of sodium 1500mg , cholesterol 200mg , calories 30% from fat and 7% or less from saturated fats, daily to have 5 or more servings of fruits and vegetables.  Education: All About Nutrition: -Group instruction provided by verbal, written material, interactive activities, discussions, models, and posters to present general guidelines  for heart healthy nutrition including fat, fiber, MyPlate, the role of sodium in heart healthy nutrition, utilization of the nutrition label, and utilization of this knowledge for meal planning. Follow up email sent as well. Written material given at graduation.   Biometrics:  Pre Biometrics - 08/31/20 1233      Pre Biometrics   Height 5' 10.75" (1.797 m)    Weight 271 lb 9.6 oz (123.2 kg)    BMI  (Calculated) 38.15    Single Leg Stand 8.16 seconds            Nutrition Therapy Plan and Nutrition Goals:   Nutrition Assessments:  MEDIFICTS Score Key:  ?70 Need to make dietary changes   40-70 Heart Healthy Diet  ? 40 Therapeutic Level Cholesterol Diet  Flowsheet Row Pulmonary Rehab from 08/31/2020 in [facility] Cardiac and Pulmonary Rehab  Picture Your Plate Total Score on Admission 54     Picture Your Plate Scores:  <18 Unhealthy dietary pattern with much room for improvement.  41-50 Dietary pattern unlikely to meet recommendations for good health and room for improvement.  51-60 More healthful dietary pattern, with some room for improvement.   >60 Healthy dietary pattern, although there may be some specific behaviors that could be improved.   Nutrition Goals Re-Evaluation:   Nutrition Goals Discharge (Final Nutrition Goals Re-Evaluation):   Psychosocial: Target Goals: Acknowledge presence or absence of significant depression and/or stress, maximize coping skills, provide positive support system. Participant is able to verbalize types and ability to use techniques and skills needed for reducing stress and depression.   Education: Stress, Anxiety, and Depression - Group verbal and visual presentation to define topics covered.  Reviews how body is impacted by stress, anxiety, and depression.  Also discusses healthy ways to reduce stress and to treat/manage anxiety and depression.  Written material given at graduation.   Education: Sleep Hygiene -Provides group verbal and written instruction about how sleep can affect your health.  Define sleep hygiene, discuss sleep cycles and impact of sleep habits. Review good sleep hygiene tips.    Initial Review & Psychosocial Screening:  Initial Psych Review & Screening - 08/28/20 1310      Initial Review   Current issues with Current Sleep Concerns;Current Stress Concerns   sleeps on and off all day; sleeps in bed from  3am-10am.   Source of Stress Concerns Unable to participate in former interests or hobbies;Unable to perform yard/household activities      [place]? Yes   wife   Concerns Inappropriate over/under dependence on family/friends      Barriers   Psychosocial barriers to participate in program There are no identifiable barriers or psychosocial needs.;The patient should benefit from training in stress management and relaxation.      Screening Interventions   Interventions Encouraged to exercise;Provide feedback about the scores to participant;To provide support and resources with identified psychosocial needs    Expected Outcomes Short Term goal: Utilizing psychosocial counselor, staff and physician to assist with identification of specific Stressors or current issues interfering with healing process. Setting desired goal for each stressor or current issue identified.;Long Term Goal: Stressors or current issues are controlled or eliminated.;Short Term goal: Identification and review with participant of any Quality of Life or Depression concerns found by scoring the questionnaire.;Long Term goal: The participant improves quality of Life and PHQ9 Scores as seen by post scores and/or verbalization of changes  Quality of Life Scores:  Scores of 19 and below usually indicate a poorer quality of life in these areas.  A difference of  2-3 points is a clinically meaningful difference.  A difference of 2-3 points in the total score of the Quality of Life Index has been associated with significant improvement in overall quality of life, self-image, physical symptoms, and general health in studies assessing change in quality of life.  PHQ-9: Recent Review Flowsheet Data    Depression screen [facility] 2/9 08/31/2020 10/20/2015 10/16/2015 01/07/2015 12/19/2014   Decreased Interest 1 0 0 0 0   Down, Depressed, Hopeless 0 0 0 0 0   PHQ - 2 Score 1 0 0 0 0   Altered sleeping 0 - - - -    Tired, decreased energy 1 - - - -   Change in appetite 0 - - - -   Feeling bad or failure about yourself  0 - - - -   Trouble concentrating 0 - - - -   Moving slowly or fidgety/restless 0 - - - -   Suicidal thoughts 0 - - - -   PHQ-9 Score 2 - - - -   Difficult doing work/chores Not difficult at all - - - -     Interpretation of Total Score  Total Score Depression Severity:  1-4 = Minimal depression, 5-9 = Mild depression, 10-14 = Moderate depression, 15-19 = Moderately severe depression, 20-27 = Severe depression   Psychosocial Evaluation and Intervention:  Psychosocial Evaluation - 08/28/20 1321      Psychosocial Evaluation & Interventions   Interventions Encouraged to exercise with the program and follow exercise prescription    Comments Mr. Keefer biggest concern is his decreased stamina accompanied by his increased shortness of breath since his lung cancer diagnosis and treatment 2 years ago. He mainly stays in his recliner because he doesn't feel like he can do much without getting severely short of breath. His wife is his main support and they both are hopeful that pulmonary rehab will help him maintain his independence. He sleeps on and off in the chair and doesnt come to bed until 2-4 am then sleeps until about 10-11am, with bathroom trips in between. He wants to get on an active routine and wants to lose the 40 lbs he gained since the completion of chemo    Expected Outcomes Short: attend Pulmonary Rehab for education and exercise. Long: develop positive self care habits.    Continue Psychosocial Services  Follow up required by staff           Psychosocial Re-Evaluation:   Psychosocial Discharge (Final Psychosocial Re-Evaluation):   Education: Education Goals: Education classes will be provided on a weekly basis, covering required topics. Participant will state understanding/return demonstration of topics presented.  Learning Barriers/Preferences:  Learning  Barriers/Preferences - 08/28/20 1305      Learning Barriers/Preferences   Learning Barriers Hearing    Learning Preferences None           General Pulmonary Education Topics:  Infection Prevention: - Provides verbal and written material to individual with discussion of infection control including proper hand washing and proper equipment cleaning during exercise session. Flowsheet Row Pulmonary Rehab from 08/31/2020 in [facility] Cardiac and Pulmonary Rehab  Education need identified 08/31/20  Date 08/31/20  Educator [place]  Instruction Review Code 1- Verbalizes Understanding      Falls Prevention: - Provides verbal and written material to individual with discussion of falls prevention and safety. Flowsheet  Row Pulmonary Rehab from 08/31/2020 in [facility] Cardiac and Pulmonary Rehab  Education need identified 08/31/20  Date 08/31/20  Educator [place]  Instruction Review Code 1- Verbalizes Understanding      Chronic Lung Disease Review: - Group verbal instruction with posters, models, PowerPoint presentations and videos,  to review new updates, new respiratory medications, new advancements in procedures and treatments. Providing information on websites and "800" numbers for continued self-education. Includes information about supplement oxygen, available portable oxygen systems, continuous and intermittent flow rates, oxygen safety, concentrators, and Medicare reimbursement for oxygen. Explanation of Pulmonary Drugs, including class, frequency, complications, importance of spacers, rinsing mouth after steroid MDI's, and proper cleaning methods for nebulizers. Review of basic lung anatomy and physiology related to function, structure, and complications of lung disease. Review of risk factors. Discussion about methods for diagnosing sleep apnea and types of masks and machines for OSA. Includes a review of the use of types of environmental controls: home humidity, furnaces, filters, dust mite/pet prevention,  HEPA vacuums. Discussion about weather changes, air quality and the benefits of nasal washing. Instruction on Warning signs, infection symptoms, calling MD promptly, preventive modes, and value of vaccinations. Review of effective airway clearance, coughing and/or vibration techniques. Emphasizing that all should Create an Action Plan. Written material given at graduation.   AED/CPR: - Group verbal and written instruction with the use of models to demonstrate the basic use of the AED with the basic ABC's of resuscitation.    Anatomy and Cardiac Procedures: - Group verbal and visual presentation and models provide information about basic cardiac anatomy and function. Reviews the testing methods done to diagnose heart disease and the outcomes of the test results. Describes the treatment choices: Medical Management, Angioplasty, or Coronary Bypass Surgery for treating various heart conditions including Myocardial Infarction, Angina, Valve Disease, and Cardiac Arrhythmias.  Written material given at graduation.   Medication Safety: - Group verbal and visual instruction to review commonly prescribed medications for heart and lung disease. Reviews the medication, class of the drug, and side effects. Includes the steps to properly store meds and maintain the prescription regimen.  Written material given at graduation.   Other: -Provides group and verbal instruction on various topics (see comments)   Knowledge Questionnaire Score:  Knowledge Questionnaire Score - 08/31/20 1235      Knowledge Questionnaire Score   Pre Score 16/18: Oxygen, Lung disease            Core Components/Risk Factors/Patient Goals at Admission:  Personal Goals and Risk Factors at Admission - 08/31/20 1255      Core Components/Risk Factors/Patient Goals on Admission    Weight Management Yes;Weight Loss    Intervention Weight Management: Develop a combined nutrition and exercise program designed to reach desired  caloric intake, while maintaining appropriate intake of nutrient and fiber, sodium and fats, and appropriate energy expenditure required for the weight goal.;Weight Management: Provide education and appropriate resources to help participant work on and attain dietary goals.;Weight Management/Obesity: Establish reasonable short term and long term weight goals.    Admit Weight 271 lb (122.9 kg)    Goal Weight: Short Term 265 lb (120.2 kg)    Goal Weight: Long Term 250 lb (113.4 kg)    Expected Outcomes Short Term: Continue to assess and modify interventions until short term weight is achieved;Long Term: Adherence to nutrition and physical activity/exercise program aimed toward attainment of established weight goal;Weight Loss: Understanding of general recommendations for a balanced deficit meal plan, which promotes 1-2 lb  weight loss per week and includes a negative energy balance of [phone] kcal/d;Understanding recommendations for meals to include 15-35% energy as protein, 25-35% energy from fat, 35-60% energy from carbohydrates, less than 200mg  of dietary cholesterol, 20-35 gm of total fiber daily;Understanding of distribution of calorie intake throughout the day with the consumption of 4-5 meals/snacks    Improve shortness of breath with ADL's Yes    Intervention Provide education, individualized exercise plan and daily activity instruction to help decrease symptoms of SOB with activities of daily living.    Expected Outcomes Short Term: Improve cardiorespiratory fitness to achieve a reduction of symptoms when performing ADLs;Long Term: Be able to perform more ADLs without symptoms or delay the onset of symptoms    Hypertension Yes    Intervention Provide education on lifestyle modifcations including regular physical activity/exercise, weight management, moderate sodium restriction and increased consumption of fresh fruit, vegetables, and low fat dairy, alcohol moderation, and smoking cessation.;Monitor  prescription use compliance.    Expected Outcomes Short Term: Continued assessment and intervention until BP is < 140/8mm HG in hypertensive participants. < 130/20mm HG in hypertensive participants with diabetes, heart failure or chronic kidney disease.;Long Term: Maintenance of blood pressure at goal levels.           Education:Diabetes - Individual verbal and written instruction to review signs/symptoms of diabetes, desired ranges of glucose level fasting, after meals and with exercise. Acknowledge that pre and post exercise glucose checks will be done for 3 sessions at entry of program.   Know Your Numbers and Heart Failure: - Group verbal and visual instruction to discuss disease risk factors for cardiac and pulmonary disease and treatment options.  Reviews associated critical values for Overweight/Obesity, Hypertension, Cholesterol, and Diabetes.  Discusses basics of heart failure: signs/symptoms and treatments.  Introduces Heart Failure Zone chart for action plan for heart failure.  Written material given at graduation.   Core Components/Risk Factors/Patient Goals Review:    Core Components/Risk Factors/Patient Goals at Discharge (Final Review):    ITP Comments:  ITP Comments    Row Name 08/28/20 1320 08/31/20 1238         ITP Comments Initial telephone orientation completed. Diagnosis can be found in [facility] 3/17. EP orientation scheduled for 5/2 at 10:30am. Completed 6MWT and gym orientation. Initial ITP created and sent for review to Dr. Emily Filbert, Medical Director.             Comments: Initial ITP

## 2020-09-03 ENCOUNTER — Other Ambulatory Visit: Payer: Self-pay

## 2020-09-03 DIAGNOSIS — C3490 Malignant neoplasm of unspecified part of unspecified bronchus or lung: Secondary | ICD-10-CM

## 2020-09-03 NOTE — Progress Notes (Signed)
Daily Session Note  Patient Details  Name: Levi [name] MRN: [id number] Date of Birth: 03/15/1941 Referring Provider:   Flowsheet Row Pulmonary Rehab from 08/31/2020 in [facility]  Referring Provider Woods, Samuel MD      Encounter Date: 09/03/2020  Check In:  Session Check In - 09/03/20 0937      Check-In   Supervising physician immediately available to respond to emergencies See telemetry face sheet for immediately available ER MD    Location [facility]    Staff Present Kelly Bollinger, MPA, RN;Kelly Hayes, BS, ACSM CEP, Exercise Physiologist;Melissa Caiola RDN, LDN;Jessica Hawkins, MA, RCEP, CCRP, CCET    Virtual Visit No    Medication changes reported     No    Fall or balance concerns reported    No    Tobacco Cessation No Change    Warm-up and Cool-down Performed on first and last piece of equipment    Resistance Training Performed Yes    VAD Patient? No    PAD/SET Patient? No      Pain Assessment   Currently in Pain? No/denies              Social History   Tobacco Use  Smoking Status Former Smoker  . Packs/day: 0.25  . Years: 62.00  . Pack years: 15.50  . Types: Cigarettes  . Quit date: 09/05/2018  . Years since quitting: 1.9  Smokeless Tobacco Never Used    Goals Met:  Independence with exercise equipment Exercise tolerated well Queuing for purse lip breathing No report of cardiac concerns or symptoms Strength training completed today  Goals Unmet:  Not Applicable  Comments: First full day of exercise!  Patient was oriented to gym and equipment including functions, settings, policies, and procedures.  Patient's individual exercise prescription and treatment plan were reviewed.  All starting workloads were established based on the results of the 6 minute walk test done at initial orientation visit.  The plan for exercise progression was also introduced and progression will be customized based on patient's  performance and goals.     Dr. Mark Miller is Medical Director for [facility] and [facility]. 

## 2020-09-10 ENCOUNTER — Other Ambulatory Visit: Payer: Self-pay

## 2020-09-10 DIAGNOSIS — C3490 Malignant neoplasm of unspecified part of unspecified bronchus or lung: Secondary | ICD-10-CM | POA: Diagnosis not present

## 2020-09-10 NOTE — Progress Notes (Signed)
Daily Session Note  Patient Details  Name: Levi [name] MRN: [id number] Date of Birth: 10-Sep-1940 Referring Provider:   Flowsheet Row Pulmonary Rehab from 08/31/2020 in [facility]  Referring Provider [name] MD      Encounter Date: 09/10/2020  Check In:  Session Check In - 09/10/20 0920      Check-In   Supervising physician immediately available to respond to emergencies See telemetry face sheet for immediately available ER MD    Location [facility]    Staff Present Birdie Sons, MPA, Mauricia Area, BS, ACSM CEP, Exercise Physiologist;Amanda Oletta Darter, BA, ACSM CEP, Exercise Physiologist    Virtual Visit No    Medication changes reported     No    Fall or balance concerns reported    No    Tobacco Cessation No Change    Warm-up and Cool-down Performed on first and last piece of equipment    Resistance Training Performed Yes    VAD Patient? No    PAD/SET Patient? No      Pain Assessment   Currently in Pain? No/denies              Social History   Tobacco Use  Smoking Status Former Smoker  . Packs/day: 0.25  . Years: 62.00  . Pack years: 15.50  . Types: Cigarettes  . Quit date: 09/05/2018  . Years since quitting: 2.0  Smokeless Tobacco Never Used    Goals Met:  Independence with exercise equipment Exercise tolerated well No report of cardiac concerns or symptoms Strength training completed today  Goals Unmet:  Not Applicable  Comments: Pt able to follow exercise prescription today without complaint.  Will continue to monitor for progression.    Dr. Emily Filbert is Medical Director for [place] and [facility].

## 2020-09-15 ENCOUNTER — Other Ambulatory Visit: Payer: Self-pay

## 2020-09-15 DIAGNOSIS — C3490 Malignant neoplasm of unspecified part of unspecified bronchus or lung: Secondary | ICD-10-CM | POA: Diagnosis not present

## 2020-09-15 NOTE — Progress Notes (Signed)
Daily Session Note  Patient Details  Name: SHAKA CARDIN MRN: [id number] Date of Birth: Sep 02, 1940 Referring Provider:   Flowsheet Row Pulmonary Rehab from 08/31/2020 in [facility]  Referring Provider [name] MD      Encounter Date: 09/15/2020  Check In:  Session Check In - 09/15/20 0924      Check-In   Supervising physician immediately available to respond to emergencies See telemetry face sheet for immediately available ER MD    Location [facility]    Staff Present Birdie Sons, MPA, RN;Melissa Caiola RDN, Rowe Pavy, BA, ACSM CEP, Exercise Physiologist;Joseph Tessie Fass RCP,RRT,BSRT    Virtual Visit No    Medication changes reported     No    Fall or balance concerns reported    No    Tobacco Cessation No Change    Warm-up and Cool-down Performed on first and last piece of equipment    Resistance Training Performed Yes    VAD Patient? No    PAD/SET Patient? No      Pain Assessment   Currently in Pain? No/denies              Social History   Tobacco Use  Smoking Status Former Smoker  . Packs/day: 0.25  . Years: 62.00  . Pack years: 15.50  . Types: Cigarettes  . Quit date: 09/05/2018  . Years since quitting: 2.0  Smokeless Tobacco Never Used    Goals Met:  Independence with exercise equipment Exercise tolerated well No report of cardiac concerns or symptoms Strength training completed today  Goals Unmet:  Not Applicable  Comments: Pt able to follow exercise prescription today without complaint.  Will continue to monitor for progression.    Dr. Emily Filbert is Medical Director for [place] and [facility].

## 2020-09-16 ENCOUNTER — Encounter: Payer: Self-pay | Admitting: *Deleted

## 2020-09-16 DIAGNOSIS — C3490 Malignant neoplasm of unspecified part of unspecified bronchus or lung: Secondary | ICD-10-CM

## 2020-09-16 NOTE — Progress Notes (Signed)
Pulmonary Individual Treatment Plan  Patient Details  Name: OSEPH IMBURGIA MRN: [id number] Date of Birth: 1940-07-29 Referring Provider:   Flowsheet Row Pulmonary Rehab from 08/31/2020 in [facility] Cardiac and Pulmonary Rehab  Referring Provider [name] MD      Initial Encounter Date:  Flowsheet Row Pulmonary Rehab from 08/31/2020 in [facility] Cardiac and Pulmonary Rehab  Date 08/31/20      Visit Diagnosis: Malignant neoplasm of lung, unspecified laterality, unspecified part of lung ([place])  Patient's Home Medications on Admission:  Current Outpatient Medications:  .  albuterol (PROVENTIL HFA;VENTOLIN HFA) 108 (90 Base) MCG/ACT inhaler, Inhale 2 puffs into the lungs every 6 (six) hours as needed for wheezing or shortness of breath., Disp: 1 Inhaler, Rfl: 0 .  apixaban (ELIQUIS) 5 MG TABS tablet, Take 1 tablet (5 mg total) by mouth 2 (two) times daily., Disp: 180 tablet, Rfl: 3 .  budesonide-formoterol (SYMBICORT) 160-4.5 MCG/ACT inhaler, Inhale 2 puffs into the lungs 2 (two) times daily., Disp: , Rfl:  .  diltiazem (DILACOR XR) 120 MG 24 hr capsule, Take 120 mg by mouth daily., Disp: , Rfl:  .  levothyroxine (SYNTHROID) 137 MCG tablet, Take 137 mcg by mouth daily before breakfast., Disp: , Rfl:  .  tamsulosin (FLOMAX) 0.4 MG CAPS capsule, Take 0.4 mg by mouth daily. , Disp: , Rfl:  .  vitamin B-12 (CYANOCOBALAMIN) 1000 MCG tablet, Take 1,000 mcg by mouth daily., Disp: , Rfl:  .  Vitamin D, Ergocalciferol, (DRISDOL) 1.25 MG (50000 UT) CAPS capsule, Take 50,000 Units by mouth every 7 (seven) days., Disp: , Rfl:   Past Medical History: Past Medical History:  Diagnosis Date  . Asthma   . COPD (chronic obstructive pulmonary disease) ([place])   . Hearing loss   . Hypertension   . Hypothyroidism   . Mass of left lung   . 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 Prostate enlargement   . Shortness of breath   . Squamous cell carcinoma of lung, left ([place])   . Thyroid disease   . Tobacco abuse      Tobacco Use: Social History   Tobacco Use  Smoking Status Former Smoker  . Packs/day: 0.25  . Years: 62.00  . Pack years: 15.50  . Types: Cigarettes  . Quit date: 09/05/2018  . Years since quitting: 2.0  Smokeless Tobacco Never Used    Labs: Recent Review Scientist, physiological    Labs for ITP Cardiac and Pulmonary Rehab Latest Ref Rng & Units 10/15/2018   PHART 7.350 - 7.450 7.42   PCO2ART 32.0 - 48.0 mmHg 35   HCO3 20.0 - 28.0 mmol/L 22.7   ACIDBASEDEF 0.0 - 2.0 mmol/L 1.3   O2SAT % 93.1       Pulmonary Assessment Scores:  Pulmonary Assessment Scores    Row Name 08/31/20 1237         ADL UCSD   ADL Phase Entry     SOB Score total 53     Rest 0     Walk 2     Stairs 4     Bath 3     Dress 3     Shop 2           CAT Score   CAT Score 26           mMRC Score   mMRC Score 3            UCSD: Self-administered rating of dyspnea associated with activities of  daily living (ADLs) 6-point scale (0 = "not at all" to 5 = "maximal or unable to do because of breathlessness")  Scoring Scores range from 0 to 120.  Minimally important difference is 5 units  CAT: CAT can identify the health impairment of COPD patients and is better correlated with disease progression.  CAT has a scoring range of zero to 40. The CAT score is classified into four groups of low (less than 10), medium (10 - 20), high (21-30) and very high (31-40) based on the impact level of disease on health status. A CAT score over 10 suggests significant symptoms.  A worsening CAT score could be explained by an exacerbation, poor medication adherence, poor inhaler technique, or progression of COPD or comorbid conditions.  CAT MCID is 2 points  mMRC: mMRC (Modified Medical Research Council) Dyspnea Scale is used to assess the degree of baseline functional disability in patients of respiratory disease due to dyspnea. No minimal important difference is established. A decrease in score of 1 point or greater is  considered a positive change.   Pulmonary Function Assessment:   Exercise Target Goals: Exercise Program Goal: Individual exercise prescription set using results from initial 6 min walk test and THRR while considering  patient's activity barriers and safety.   Exercise Prescription Goal: Initial exercise prescription builds to 30-45 minutes a day of aerobic activity, 2-3 days per week.  Home exercise guidelines will be given to patient during program as part of exercise prescription that the participant will acknowledge.  Education: Aerobic Exercise: - Group verbal and visual presentation on the components of exercise prescription. Introduces F.I.T.T principle from ACSM for exercise prescriptions.  Reviews F.I.T.T. principles of aerobic exercise including progression. Written material given at graduation.   Education: Resistance Exercise: - Group verbal and visual presentation on the components of exercise prescription. Introduces F.I.T.T principle from ACSM for exercise prescriptions  Reviews F.I.T.T. principles of resistance exercise including progression. Written material given at graduation.    Education: Exercise & Equipment Safety: - Individual verbal instruction and demonstration of equipment use and safety with use of the equipment. Flowsheet Row Pulmonary Rehab from 09/10/2020 in [facility] Cardiac and Pulmonary Rehab  Education need identified 08/31/20  Date 08/31/20  Educator Caroline  Instruction Review Code 1- Verbalizes Understanding      Education: Exercise Physiology & General Exercise Guidelines: - Group verbal and written instruction with models to review the exercise physiology of the cardiovascular system and associated critical values. Provides general exercise guidelines with specific guidelines to those with heart or lung disease.    Education: Flexibility, Balance, Mind/Body Relaxation: - Group verbal and visual presentation with interactive activity on the components of  exercise prescription. Introduces F.I.T.T principle from ACSM for exercise prescriptions. Reviews F.I.T.T. principles of flexibility and balance exercise training including progression. Also discusses the mind body connection.  Reviews various relaxation techniques to help reduce and manage stress (i.e. Deep breathing, progressive muscle relaxation, and visualization). Balance handout provided to take home. Written material given at graduation. Flowsheet Row Pulmonary Rehab from 09/10/2020 in [facility] Cardiac and Pulmonary Rehab  Date 09/03/20  Educator AS  Instruction Review Code 1- Verbalizes Understanding      Activity Barriers & Risk Stratification:  Activity Barriers & Cardiac Risk Stratification - 08/31/20 1234      Activity Barriers & Cardiac Risk Stratification   Activity Barriers Shortness of Breath;Deconditioning;Muscular Weakness;Other (comment)    Comments Sciatica           6 Minute Walk:  [place] Name 08/31/20 1240         6 Minute Walk   Phase Initial     Distance 780 feet     Walk Time 6 minutes     # of Rest Breaks 0     MPH 1.47     METS 1.08     RPE 13     Perceived Dyspnea  1     VO2 Peak 3.81     Symptoms Yes (comment)     Comments Lower back pain 7/10     Resting HR 82 bpm     Resting BP 132/78     Resting Oxygen Saturation  94 %     Exercise Oxygen Saturation  during 6 min walk 86 %     Max Ex. HR 101 bpm     Max Ex. BP 144/76     2 Minute Post BP 124/78           Interval HR   1 Minute HR 93     2 Minute HR 97     3 Minute HR 98     4 Minute HR 99     5 Minute HR 101     6 Minute HR 100     2 Minute Post HR 87     Interval Heart Rate? Yes           Interval Oxygen   Interval Oxygen? Yes     Baseline Oxygen Saturation % 94 %     1 Minute Oxygen Saturation % 90 %     1 Minute Liters of Oxygen 0 L  RA     2 Minute Oxygen Saturation % 87 %     2 Minute Liters of Oxygen 0 L     3 Minute Oxygen Saturation % 86 %     3 Minute  Liters of Oxygen 0 L     4 Minute Oxygen Saturation % 87 %     4 Minute Liters of Oxygen 0 L     5 Minute Oxygen Saturation % 86 %     5 Minute Liters of Oxygen 0 L     6 Minute Oxygen Saturation % 88 %     6 Minute Liters of Oxygen 0 L     2 Minute Post Oxygen Saturation % 94 %     2 Minute Post Liters of Oxygen 0 L           Oxygen Initial Assessment:  Oxygen Initial Assessment - 08/31/20 1237      Home Oxygen   Home Oxygen Device None    Sleep Oxygen Prescription None    Home Exercise Oxygen Prescription None    Home Resting Oxygen Prescription None      Initial 6 min Walk   Oxygen Used None      Program Oxygen Prescription   Program Oxygen Prescription None      Intervention   Short Term Goals To learn and understand importance of monitoring SPO2 with pulse oximeter and demonstrate accurate use of the pulse oximeter.;To learn and understand importance of maintaining oxygen saturations>88%;To learn and demonstrate proper pursed lip breathing techniques or other breathing techniques.;To learn and demonstrate proper use of respiratory medications    Long  Term Goals Verbalizes importance of monitoring SPO2 with pulse oximeter and return demonstration;Maintenance of O2 saturations>88%;Exhibits proper breathing techniques, such as pursed lip breathing or other method taught during  program session;Compliance with respiratory medication;Demonstrates proper use of MDI's           Oxygen Re-Evaluation:  Oxygen Re-Evaluation    Row Name 09/03/20 0950             Program Oxygen Prescription   Program Oxygen Prescription None               Home Oxygen   Home Oxygen Device None       Sleep Oxygen Prescription None       Home Exercise Oxygen Prescription None       Home Resting Oxygen Prescription None               Goals/Expected Outcomes   Short Term Goals To learn and understand importance of monitoring SPO2 with pulse oximeter and demonstrate accurate use of the  pulse oximeter.;To learn and understand importance of maintaining oxygen saturations>88%;To learn and demonstrate proper pursed lip breathing techniques or other breathing techniques.       Long  Term Goals Verbalizes importance of monitoring SPO2 with pulse oximeter and return demonstration;Maintenance of O2 saturations>88%;Exhibits proper breathing techniques, such as pursed lip breathing or other method taught during program session;Exhibits compliance with exercise, home and travel O2 prescription       Comments Reviewed PLB technique with pt.  Talked about how it works and it's importance in maintaining their exercise saturations.       Goals/Expected Outcomes Short: Become more profiecient at using PLB.   Long: Become independent at using PLB.              Oxygen Discharge (Final Oxygen Re-Evaluation):  Oxygen Re-Evaluation - 09/03/20 0950      Program Oxygen Prescription   Program Oxygen Prescription None      Home Oxygen   Home Oxygen Device None    Sleep Oxygen Prescription None    Home Exercise Oxygen Prescription None    Home Resting Oxygen Prescription None      Goals/Expected Outcomes   Short Term Goals To learn and understand importance of monitoring SPO2 with pulse oximeter and demonstrate accurate use of the pulse oximeter.;To learn and understand importance of maintaining oxygen saturations>88%;To learn and demonstrate proper pursed lip breathing techniques or other breathing techniques.    Long  Term Goals Verbalizes importance of monitoring SPO2 with pulse oximeter and return demonstration;Maintenance of O2 saturations>88%;Exhibits proper breathing techniques, such as pursed lip breathing or other method taught during program session;Exhibits compliance with exercise, home and travel O2 prescription    Comments Reviewed PLB technique with pt.  Talked about how it works and it's importance in maintaining their exercise saturations.    Goals/Expected Outcomes Short:  Become more profiecient at using PLB.   Long: Become independent at using PLB.           Initial Exercise Prescription:  Initial Exercise Prescription - 08/31/20 1200      Date of Initial Exercise RX and Referring Provider   Date 08/31/20    Referring Provider [name] MD      Recumbant Bike   Level 1    RPM 60    Watts 10    Minutes 15    METs 1      NuStep   Level 1    SPM 80    Minutes 15    METs 1      Track   Laps 10    Minutes 15    METs 1  Prescription Details   Frequency (times per week) 2    Duration Progress to 30 minutes of continuous aerobic without signs/symptoms of physical distress      Intensity   THRR 40-80% of Max Heartrate 106-130    Ratings of Perceived Exertion 11-13    Perceived Dyspnea 0-4      Progression   Progression Continue to progress workloads to maintain intensity without signs/symptoms of physical distress.      Resistance Training   Training Prescription Yes    Weight 3 lb    Reps 10-15           Perform Capillary Blood Glucose checks as needed.  Exercise Prescription Changes:  Exercise Prescription Changes    Row Name 08/31/20 1200 09/08/20 1300           Response to Exercise   Blood Pressure (Admit) 132/78 128/82      Blood Pressure (Exercise) 144/76 146/64      Blood Pressure (Exit) 124/78 144/74      Heart Rate (Admit) 82 bpm 88 bpm      Heart Rate (Exercise) 101 bpm 97 bpm      Heart Rate (Exit) 87 bpm 88 bpm      Oxygen Saturation (Admit) 94 % 90 %      Oxygen Saturation (Exercise) 86 % 89 %      Oxygen Saturation (Exit) 94 % 92 %      Rating of Perceived Exertion (Exercise) 13 13      Perceived Dyspnea (Exercise) 1 3      Symptoms Back pain 7/10, SOB none      Comments walk test results --      Duration -- Progress to 30 minutes of  aerobic without signs/symptoms of physical distress      Intensity -- THRR unchanged             Progression   Progression -- Continue to progress workloads to  maintain intensity without signs/symptoms of physical distress.      Average METs -- 2             Resistance Training   Training Prescription -- Yes      Weight -- 3 lb      Reps -- 10-15             Biostep-RELP   Level -- 2      Minutes -- 15      METs -- 2             Track   Laps -- 5      Minutes -- 15             Exercise Comments:  Exercise Comments    Row Name 09/03/20 0949           Exercise Comments First full day of exercise!  Patient was oriented to gym and equipment including functions, settings, policies, and procedures.  Patient's individual exercise prescription and treatment plan were reviewed.  All starting workloads were established based on the results of the 6 minute walk test done at initial orientation visit.  The plan for exercise progression was also introduced and progression will be customized based on patient's performance and goals.              Exercise Goals and Review:  Exercise Goals    Row Name 08/31/20 1254             Exercise Goals   Increase  Physical Activity Yes       Intervention Provide advice, education, support and counseling about physical activity/exercise needs.;Develop an individualized exercise prescription for aerobic and resistive training based on initial evaluation findings, risk stratification, comorbidities and participant's personal goals.       Expected Outcomes Short Term: Attend rehab on a regular basis to increase amount of physical activity.;Long Term: Add in home exercise to make exercise part of routine and to increase amount of physical activity.;Long Term: Exercising regularly at least 3-5 days a week.       Increase Strength and Stamina Yes       Intervention Provide advice, education, support and counseling about physical activity/exercise needs.;Develop an individualized exercise prescription for aerobic and resistive training based on initial evaluation findings, risk stratification, comorbidities  and participant's personal goals.       Expected Outcomes Short Term: Increase workloads from initial exercise prescription for resistance, speed, and METs.;Short Term: Perform resistance training exercises routinely during rehab and add in resistance training at home;Long Term: Improve cardiorespiratory fitness, muscular endurance and strength as measured by increased METs and functional capacity (6MWT)       Able to understand and use rate of perceived exertion (RPE) scale Yes       Intervention Provide education and explanation on how to use RPE scale       Expected Outcomes Short Term: Able to use RPE daily in rehab to express subjective intensity level;Long Term:  Able to use RPE to guide intensity level when exercising independently       Able to understand and use Dyspnea scale Yes       Intervention Provide education and explanation on how to use Dyspnea scale       Expected Outcomes Short Term: Able to use Dyspnea scale daily in rehab to express subjective sense of shortness of breath during exertion;Long Term: Able to use Dyspnea scale to guide intensity level when exercising independently       Knowledge and understanding of Target Heart Rate Range (THRR) Yes       Intervention Provide education and explanation of THRR including how the numbers were predicted and where they are located for reference       Expected Outcomes Short Term: Able to state/look up THRR;Short Term: Able to use daily as guideline for intensity in rehab;Long Term: Able to use THRR to govern intensity when exercising independently       Able to check pulse independently Yes       Intervention Provide education and demonstration on how to check pulse in carotid and radial arteries.;Review the importance of being able to check your own pulse for safety during independent exercise       Expected Outcomes Short Term: Able to explain why pulse checking is important during independent exercise;Long Term: Able to check pulse  independently and accurately       Understanding of Exercise Prescription Yes       Intervention Provide education, explanation, and written materials on patient's individual exercise prescription       Expected Outcomes Short Term: Able to explain program exercise prescription;Long Term: Able to explain home exercise prescription to exercise independently              Exercise Goals Re-Evaluation :  Exercise Goals Re-Evaluation    [place] Name 09/03/20 0949             Exercise Goal Re-Evaluation   Exercise Goals Review Increase Physical Activity;Able to understand  and use rate of perceived exertion (RPE) scale;Knowledge and understanding of Target Heart Rate Range (THRR);Understanding of Exercise Prescription;Increase Strength and Stamina;Able to understand and use Dyspnea scale;Able to check pulse independently       Comments Reviewed RPE and dyspnea scales, THR and program prescription with pt today.  Pt voiced understanding and was given a copy of goals to take home.       Expected Outcomes Short: Use RPE daily to regulate intensity. Long: Follow program prescription in THR.              Discharge Exercise Prescription (Final Exercise Prescription Changes):  Exercise Prescription Changes - 09/08/20 1300      Response to Exercise   Blood Pressure (Admit) 128/82    Blood Pressure (Exercise) 146/64    Blood Pressure (Exit) 144/74    Heart Rate (Admit) 88 bpm    Heart Rate (Exercise) 97 bpm    Heart Rate (Exit) 88 bpm    Oxygen Saturation (Admit) 90 %    Oxygen Saturation (Exercise) 89 %    Oxygen Saturation (Exit) 92 %    Rating of Perceived Exertion (Exercise) 13    Perceived Dyspnea (Exercise) 3    Symptoms none    Duration Progress to 30 minutes of  aerobic without signs/symptoms of physical distress    Intensity THRR unchanged      Progression   Progression Continue to progress workloads to maintain intensity without signs/symptoms of physical distress.    Average  METs 2      Resistance Training   Training Prescription Yes    Weight 3 lb    Reps 10-15      Biostep-RELP   Level 2    Minutes 15    METs 2      Track   Laps 5    Minutes 15           Nutrition:  Target Goals: Understanding of nutrition guidelines, daily intake of sodium 1500mg , cholesterol 200mg , calories 30% from fat and 7% or less from saturated fats, daily to have 5 or more servings of fruits and vegetables.  Education: All About Nutrition: -Group instruction provided by verbal, written material, interactive activities, discussions, models, and posters to present general guidelines for heart healthy nutrition including fat, fiber, MyPlate, the role of sodium in heart healthy nutrition, utilization of the nutrition label, and utilization of this knowledge for meal planning. Follow up email sent as well. Written material given at graduation. Flowsheet Row Pulmonary Rehab from 09/10/2020 in [facility] Cardiac and Pulmonary Rehab  Date 09/10/20  Educator [facility]  Instruction Review Code 1- Verbalizes Understanding      Biometrics:  Pre Biometrics - 08/31/20 1233      Pre Biometrics   Height 5' 10.75" (1.797 m)    Weight 271 lb 9.6 oz (123.2 kg)    BMI (Calculated) 38.15    Single Leg Stand 8.16 seconds            Nutrition Therapy Plan and Nutrition Goals:   Nutrition Assessments:  MEDIFICTS Score Key:  ?70 Need to make dietary changes   40-70 Heart Healthy Diet  ? 40 Therapeutic Level Cholesterol Diet  Flowsheet Row Pulmonary Rehab from 08/31/2020 in [facility] Cardiac and Pulmonary Rehab  Picture Your Plate Total Score on Admission 54     Picture Your Plate Scores:  <32 Unhealthy dietary pattern with much room for improvement.  41-50 Dietary pattern unlikely to meet recommendations for good health and room  for improvement.  51-60 More healthful dietary pattern, with some room for improvement.   >60 Healthy dietary pattern, although there may be some specific  behaviors that could be improved.   Nutrition Goals Re-Evaluation:   Nutrition Goals Discharge (Final Nutrition Goals Re-Evaluation):   Psychosocial: Target Goals: Acknowledge presence or absence of significant depression and/or stress, maximize coping skills, provide positive support system. Participant is able to verbalize types and ability to use techniques and skills needed for reducing stress and depression.   Education: Stress, Anxiety, and Depression - Group verbal and visual presentation to define topics covered.  Reviews how body is impacted by stress, anxiety, and depression.  Also discusses healthy ways to reduce stress and to treat/manage anxiety and depression.  Written material given at graduation.   Education: Sleep Hygiene -Provides group verbal and written instruction about how sleep can affect your health.  Define sleep hygiene, discuss sleep cycles and impact of sleep habits. Review good sleep hygiene tips.    Initial Review & Psychosocial Screening:  Initial Psych Review & Screening - 08/28/20 1310      Initial Review   Current issues with Current Sleep Concerns;Current Stress Concerns   sleeps on and off all day; sleeps in bed from 3am-10am.   Source of Stress Concerns Unable to participate in former interests or hobbies;Unable to perform yard/household activities      [place]? Yes   wife   Concerns Inappropriate over/under dependence on family/friends      Barriers   Psychosocial barriers to participate in program There are no identifiable barriers or psychosocial needs.;The patient should benefit from training in stress management and relaxation.      Screening Interventions   Interventions Encouraged to exercise;Provide feedback about the scores to participant;To provide support and resources with identified psychosocial needs    Expected Outcomes Short Term goal: Utilizing psychosocial counselor, staff and physician to assist  with identification of specific Stressors or current issues interfering with healing process. Setting desired goal for each stressor or current issue identified.;Long Term Goal: Stressors or current issues are controlled or eliminated.;Short Term goal: Identification and review with participant of any Quality of Life or Depression concerns found by scoring the questionnaire.;Long Term goal: The participant improves quality of Life and PHQ9 Scores as seen by post scores and/or verbalization of changes           Quality of Life Scores:  Scores of 19 and below usually indicate a poorer quality of life in these areas.  A difference of  2-3 points is a clinically meaningful difference.  A difference of 2-3 points in the total score of the Quality of Life Index has been associated with significant improvement in overall quality of life, self-image, physical symptoms, and general health in studies assessing change in quality of life.  PHQ-9: Recent Review Flowsheet Data    Depression screen [facility] 2/9 08/31/2020 10/20/2015 10/16/2015 01/07/2015 12/19/2014   Decreased Interest 1 0 0 0 0   Down, Depressed, Hopeless 0 0 0 0 0   PHQ - 2 Score 1 0 0 0 0   Altered sleeping 0 - - - -   Tired, decreased energy 1 - - - -   Change in appetite 0 - - - -   Feeling bad or failure about yourself  0 - - - -   Trouble concentrating 0 - - - -   Moving slowly or fidgety/restless 0 - - - -  Suicidal thoughts 0 - - - -   PHQ-9 Score 2 - - - -   Difficult doing work/chores Not difficult at all - - - -     Interpretation of Total Score  Total Score Depression Severity:  1-4 = Minimal depression, 5-9 = Mild depression, 10-14 = Moderate depression, 15-19 = Moderately severe depression, 20-27 = Severe depression   Psychosocial Evaluation and Intervention:  Psychosocial Evaluation - 08/28/20 1321      Psychosocial Evaluation & Interventions   Interventions Encouraged to exercise with the program and follow exercise  prescription    Comments Mr. Terrel biggest concern is his decreased stamina accompanied by his increased shortness of breath since his lung cancer diagnosis and treatment 2 years ago. He mainly stays in his recliner because he doesn't feel like he can do much without getting severely short of breath. His wife is his main support and they both are hopeful that pulmonary rehab will help him maintain his independence. He sleeps on and off in the chair and doesnt come to bed until 2-4 am then sleeps until about 10-11am, with bathroom trips in between. He wants to get on an active routine and wants to lose the 40 lbs he gained since the completion of chemo    Expected Outcomes Short: attend Pulmonary Rehab for education and exercise. Long: develop positive self care habits.    Continue Psychosocial Services  Follow up required by staff           Psychosocial Re-Evaluation:   Psychosocial Discharge (Final Psychosocial Re-Evaluation):   Education: Education Goals: Education classes will be provided on a weekly basis, covering required topics. Participant will state understanding/return demonstration of topics presented.  Learning Barriers/Preferences:  Learning Barriers/Preferences - 08/28/20 1305      Learning Barriers/Preferences   Learning Barriers Hearing    Learning Preferences None           General Pulmonary Education Topics:  Infection Prevention: - Provides verbal and written material to individual with discussion of infection control including proper hand washing and proper equipment cleaning during exercise session. Flowsheet Row Pulmonary Rehab from 09/10/2020 in [facility] Cardiac and Pulmonary Rehab  Education need identified 08/31/20  Date 08/31/20  Educator [place]  Instruction Review Code 1- Verbalizes Understanding      Falls Prevention: - Provides verbal and written material to individual with discussion of falls prevention and safety. Flowsheet Row Pulmonary Rehab from  09/10/2020 in [facility] Cardiac and Pulmonary Rehab  Education need identified 08/31/20  Date 08/31/20  Educator [place]  Instruction Review Code 1- Verbalizes Understanding      Chronic Lung Disease Review: - Group verbal instruction with posters, models, PowerPoint presentations and videos,  to review new updates, new respiratory medications, new advancements in procedures and treatments. Providing information on websites and "800" numbers for continued self-education. Includes information about supplement oxygen, available portable oxygen systems, continuous and intermittent flow rates, oxygen safety, concentrators, and Medicare reimbursement for oxygen. Explanation of Pulmonary Drugs, including class, frequency, complications, importance of spacers, rinsing mouth after steroid MDI's, and proper cleaning methods for nebulizers. Review of basic lung anatomy and physiology related to function, structure, and complications of lung disease. Review of risk factors. Discussion about methods for diagnosing sleep apnea and types of masks and machines for OSA. Includes a review of the use of types of environmental controls: home humidity, furnaces, filters, dust mite/pet prevention, HEPA vacuums. Discussion about weather changes, air quality and the benefits of nasal washing. Instruction on  Warning signs, infection symptoms, calling MD promptly, preventive modes, and value of vaccinations. Review of effective airway clearance, coughing and/or vibration techniques. Emphasizing that all should Create an Action Plan. Written material given at graduation.   AED/CPR: - Group verbal and written instruction with the use of models to demonstrate the basic use of the AED with the basic ABC's of resuscitation.    Anatomy and Cardiac Procedures: - Group verbal and visual presentation and models provide information about basic cardiac anatomy and function. Reviews the testing methods done to diagnose heart disease and the  outcomes of the test results. Describes the treatment choices: Medical Management, Angioplasty, or Coronary Bypass Surgery for treating various heart conditions including Myocardial Infarction, Angina, Valve Disease, and Cardiac Arrhythmias.  Written material given at graduation.   Medication Safety: - Group verbal and visual instruction to review commonly prescribed medications for heart and lung disease. Reviews the medication, class of the drug, and side effects. Includes the steps to properly store meds and maintain the prescription regimen.  Written material given at graduation.   Other: -Provides group and verbal instruction on various topics (see comments)   Knowledge Questionnaire Score:  Knowledge Questionnaire Score - 08/31/20 1235      Knowledge Questionnaire Score   Pre Score 16/18: Oxygen, Lung disease            Core Components/Risk Factors/Patient Goals at Admission:  Personal Goals and Risk Factors at Admission - 08/31/20 1255      Core Components/Risk Factors/Patient Goals on Admission    Weight Management Yes;Weight Loss    Intervention Weight Management: Develop a combined nutrition and exercise program designed to reach desired caloric intake, while maintaining appropriate intake of nutrient and fiber, sodium and fats, and appropriate energy expenditure required for the weight goal.;Weight Management: Provide education and appropriate resources to help participant work on and attain dietary goals.;Weight Management/Obesity: Establish reasonable short term and long term weight goals.    Admit Weight 271 lb (122.9 kg)    Goal Weight: Short Term 265 lb (120.2 kg)    Goal Weight: Long Term 250 lb (113.4 kg)    Expected Outcomes Short Term: Continue to assess and modify interventions until short term weight is achieved;Long Term: Adherence to nutrition and physical activity/exercise program aimed toward attainment of established weight goal;Weight Loss: Understanding of  general recommendations for a balanced deficit meal plan, which promotes 1-2 lb weight loss per week and includes a negative energy balance of [phone] kcal/d;Understanding recommendations for meals to include 15-35% energy as protein, 25-35% energy from fat, 35-60% energy from carbohydrates, less than 200mg  of dietary cholesterol, 20-35 gm of total fiber daily;Understanding of distribution of calorie intake throughout the day with the consumption of 4-5 meals/snacks    Improve shortness of breath with ADL's Yes    Intervention Provide education, individualized exercise plan and daily activity instruction to help decrease symptoms of SOB with activities of daily living.    Expected Outcomes Short Term: Improve cardiorespiratory fitness to achieve a reduction of symptoms when performing ADLs;Long Term: Be able to perform more ADLs without symptoms or delay the onset of symptoms    Hypertension Yes    Intervention Provide education on lifestyle modifcations including regular physical activity/exercise, weight management, moderate sodium restriction and increased consumption of fresh fruit, vegetables, and low fat dairy, alcohol moderation, and smoking cessation.;Monitor prescription use compliance.    Expected Outcomes Short Term: Continued assessment and intervention until BP is < 140/54mm HG in hypertensive participants. <  130/77mm HG in hypertensive participants with diabetes, heart failure or chronic kidney disease.;Long Term: Maintenance of blood pressure at goal levels.           Education:Diabetes - Individual verbal and written instruction to review signs/symptoms of diabetes, desired ranges of glucose level fasting, after meals and with exercise. Acknowledge that pre and post exercise glucose checks will be done for 3 sessions at entry of program.   Know Your Numbers and Heart Failure: - Group verbal and visual instruction to discuss disease risk factors for cardiac and pulmonary disease and  treatment options.  Reviews associated critical values for Overweight/Obesity, Hypertension, Cholesterol, and Diabetes.  Discusses basics of heart failure: signs/symptoms and treatments.  Introduces Heart Failure Zone chart for action plan for heart failure.  Written material given at graduation.   Core Components/Risk Factors/Patient Goals Review:    Core Components/Risk Factors/Patient Goals at Discharge (Final Review):    ITP Comments:  ITP Comments    Row Name 08/28/20 1320 08/31/20 1238 09/03/20 0938 09/16/20 0708     ITP Comments Initial telephone orientation completed. Diagnosis can be found in [facility] 3/17. EP orientation scheduled for 5/2 at 10:30am. Completed 6MWT and gym orientation. Initial ITP created and sent for review to Dr. Emily Filbert, Medical Director. First full day of exercise!  Patient was oriented to gym and equipment including functions, settings, policies, and procedures.  Patient's individual exercise prescription and treatment plan were reviewed.  All starting workloads were established based on the results of the 6 minute walk test done at initial orientation visit.  The plan for exercise progression was also introduced and progression will be customized based on patient's performance and goals. 30 Day review completed. Medical Director ITP review done, changes made as directed, and signed approval by Medical Director.  New to program           Comments:

## 2020-09-17 ENCOUNTER — Other Ambulatory Visit: Payer: Self-pay

## 2020-09-17 DIAGNOSIS — C3490 Malignant neoplasm of unspecified part of unspecified bronchus or lung: Secondary | ICD-10-CM

## 2020-09-17 NOTE — Progress Notes (Signed)
Daily Session Note  Patient Details  Name: Levi [name] MRN: [id number] Date of Birth: 10/31/1940 Referring Provider:   Flowsheet Row Pulmonary Rehab from 08/31/2020 in [facility] Cardiac and Pulmonary Rehab  Referring Provider [name] MD      Encounter Date: 09/17/2020  Check In:  Session Check In - 09/17/20 0926      Check-In   Supervising physician immediately available to respond to emergencies See telemetry face sheet for immediately available ER MD    Location [facility]-Cardiac & Pulmonary Rehab    Staff Present Birdie Sons, MPA, Mauricia Area, BS, ACSM CEP, Exercise Physiologist;Amanda Oletta Darter, BA, ACSM CEP, Exercise Physiologist    Virtual Visit No    Medication changes reported     No    Fall or balance concerns reported    No    Tobacco Cessation No Change    Warm-up and Cool-down Performed on first and last piece of equipment    Resistance Training Performed Yes    VAD Patient? No    PAD/SET Patient? No      Pain Assessment   Currently in Pain? No/denies              Social History   Tobacco Use  Smoking Status Former Smoker  . Packs/day: 0.25  . Years: 62.00  . Pack years: 15.50  . Types: Cigarettes  . Quit date: 09/05/2018  . Years since quitting: 2.0  Smokeless Tobacco Never Used    Goals Met:  Independence with exercise equipment Exercise tolerated well No report of cardiac concerns or symptoms Strength training completed today  Goals Unmet:  Not Applicable  Comments: Pt able to follow exercise prescription today without complaint.  Will continue to monitor for progression.    Dr. Emily Filbert is Medical Director for [place] [facility] Pulmonary [facility].

## 2020-09-22 ENCOUNTER — Other Ambulatory Visit: Payer: Self-pay

## 2020-09-22 DIAGNOSIS — C3490 Malignant neoplasm of unspecified part of unspecified bronchus or lung: Secondary | ICD-10-CM

## 2020-09-22 NOTE — Progress Notes (Signed)
Daily Session Note  Patient Details  Name: Levi [name] MRN: [id number] Date of Birth: 1940-06-23 Referring Provider:   Flowsheet Row Pulmonary Rehab from 08/31/2020 in [facility] Cardiac and Pulmonary Rehab  Referring Provider [name] MD      Encounter Date: 09/22/2020  Check In:  Session Check In - 09/22/20 0918      Check-In   Supervising physician immediately available to respond to emergencies See telemetry face sheet for immediately available ER MD    Location [facility]-Cardiac & Pulmonary Rehab    Staff Present Birdie Sons, MPA, RN;Amanda Oletta Darter, BA, ACSM CEP, Exercise Physiologist;Kara Eliezer Bottom, MS, ASCM CEP, Exercise Physiologist    Virtual Visit No    Medication changes reported     No    Fall or balance concerns reported    No    Tobacco Cessation No Change    Warm-up and Cool-down Performed on first and last piece of equipment    Resistance Training Performed Yes    VAD Patient? No    PAD/SET Patient? No      Pain Assessment   Currently in Pain? No/denies              Social History   Tobacco Use  Smoking Status Former Smoker  . Packs/day: 0.25  . Years: 62.00  . Pack years: 15.50  . Types: Cigarettes  . Quit date: 09/05/2018  . Years since quitting: 2.0  Smokeless Tobacco Never Used    Goals Met:  Independence with exercise equipment Exercise tolerated well No report of cardiac concerns or symptoms Strength training completed today  Goals Unmet:  Not Applicable  Comments: Pt able to follow exercise prescription today without complaint.  Will continue to monitor for progression.    Dr. Emily Filbert is Medical Director for [place].  Dr. Ottie Glazier is Medical Director for [facility] Pulmonary Rehabilitation.

## 2020-09-24 ENCOUNTER — Other Ambulatory Visit: Payer: Self-pay

## 2020-09-24 DIAGNOSIS — C3490 Malignant neoplasm of unspecified part of unspecified bronchus or lung: Secondary | ICD-10-CM | POA: Diagnosis not present

## 2020-09-24 NOTE — Progress Notes (Signed)
Daily Session Note  Patient Details  Name: Levi [name] MRN: [id number] Date of Birth: 11/03/1940 Referring Provider:   Flowsheet Row Pulmonary Rehab from 08/31/2020 in [facility] Cardiac and Pulmonary Rehab  Referring Provider [name] MD      Encounter Date: 09/24/2020  Check In:  Session Check In - 09/24/20 0921      Check-In   Supervising physician immediately available to respond to emergencies See telemetry face sheet for immediately available ER MD    Location [facility]-Cardiac & Pulmonary Rehab    Staff Present Birdie Sons, MPA, RN;Melissa Caiola RDN, Rowe Pavy, BA, ACSM CEP, Exercise Physiologist    Virtual Visit No    Medication changes reported     No    Fall or balance concerns reported    No    Tobacco Cessation No Change    Warm-up and Cool-down Performed on first and last piece of equipment    Resistance Training Performed Yes    VAD Patient? No    PAD/SET Patient? No      Pain Assessment   Currently in Pain? No/denies              Social History   Tobacco Use  Smoking Status Former Smoker  . Packs/day: 0.25  . Years: 62.00  . Pack years: 15.50  . Types: Cigarettes  . Quit date: 09/05/2018  . Years since quitting: 2.0  Smokeless Tobacco Never Used    Goals Met:  Independence with exercise equipment Exercise tolerated well No report of cardiac concerns or symptoms Strength training completed today  Goals Unmet:  Not Applicable  Comments: Pt able to follow exercise prescription today without complaint.  Will continue to monitor for progression.    Dr. Emily Filbert is Medical Director for [place].  Dr. Ottie Glazier is Medical Director for [facility] Pulmonary Rehabilitation.

## 2020-09-29 ENCOUNTER — Other Ambulatory Visit: Payer: Self-pay

## 2020-09-29 ENCOUNTER — Encounter: Payer: No Typology Code available for payment source | Admitting: *Deleted

## 2020-09-29 DIAGNOSIS — C3490 Malignant neoplasm of unspecified part of unspecified bronchus or lung: Secondary | ICD-10-CM | POA: Diagnosis not present

## 2020-09-29 NOTE — Progress Notes (Signed)
Daily Session Note  Patient Details  Name: MARL SEAGO MRN: [id number] Date of Birth: 03/18/1941 Referring Provider:   Flowsheet Row Pulmonary Rehab from 08/31/2020 in [facility] Cardiac and Pulmonary Rehab  Referring Provider [name] MD      Encounter Date: 09/29/2020  Check In:  Session Check In - 09/29/20 0933      Check-In   Supervising physician immediately available to respond to emergencies See telemetry face sheet for immediately available ER MD    Location [facility]-Cardiac & Pulmonary Rehab    Staff Present Heath Lark, RN, BSN, Jacklynn Bue, MS, ASCM CEP, Exercise Physiologist;Melissa Caiola RDN, LDN    Virtual Visit No    Medication changes reported     No    Fall or balance concerns reported    No    Warm-up and Cool-down Performed on first and last piece of equipment    Resistance Training Performed Yes    VAD Patient? No    PAD/SET Patient? No      Pain Assessment   Currently in Pain? No/denies              Social History   Tobacco Use  Smoking Status Former Smoker  . Packs/day: 0.25  . Years: 62.00  . Pack years: 15.50  . Types: Cigarettes  . Quit date: 09/05/2018  . Years since quitting: 2.0  Smokeless Tobacco Never Used    Goals Met:  Proper associated with RPD/PD & O2 Sat Independence with exercise equipment Exercise tolerated well No report of cardiac concerns or symptoms  Goals Unmet:  Not Applicable  Comments: Pt able to follow exercise prescription today without complaint.  Will continue to monitor for progression.    Dr. Emily Filbert is Medical Director for [place].  Dr. Ottie Glazier is Medical Director for [facility] Pulmonary Rehabilitation.

## 2020-10-01 ENCOUNTER — Other Ambulatory Visit: Payer: Self-pay

## 2020-10-01 ENCOUNTER — Encounter: Payer: No Typology Code available for payment source | Attending: Internal Medicine | Admitting: *Deleted

## 2020-10-01 DIAGNOSIS — C3490 Malignant neoplasm of unspecified part of unspecified bronchus or lung: Secondary | ICD-10-CM | POA: Diagnosis not present

## 2020-10-01 NOTE — Progress Notes (Signed)
Daily Session Note  Patient Details  Name: Levi [name] MRN: [id number] Date of Birth: 1940/06/20 Referring Provider:   Flowsheet Row Pulmonary Rehab from 08/31/2020 in [facility] Cardiac and Pulmonary Rehab  Referring Provider [name] MD      Encounter Date: 10/01/2020  Check In:  Session Check In - 10/01/20 1152      Check-In   Supervising physician immediately available to respond to emergencies See telemetry face sheet for immediately available ER MD    Location [facility]-Cardiac & Pulmonary Rehab    Staff Present Heath Lark, RN, BSN, Laveda Norman, BS, ACSM CEP, Exercise Physiologist;Amanda Oletta Darter, [place], ACSM CEP, Exercise Physiologist    Virtual Visit No    Medication changes reported     No    Fall or balance concerns reported    No    Warm-up and Cool-down Performed on first and last piece of equipment    Resistance Training Performed Yes    VAD Patient? No    PAD/SET Patient? No      Pain Assessment   Currently in Pain? No/denies              Social History   Tobacco Use  Smoking Status Former Smoker  . Packs/day: 0.25  . Years: 62.00  . Pack years: 15.50  . Types: Cigarettes  . Quit date: 09/05/2018  . Years since quitting: 2.0  Smokeless Tobacco Never Used    Goals Met:  Proper associated with RPD/PD & O2 Sat Independence with exercise equipment Exercise tolerated well No report of cardiac concerns or symptoms  Goals Unmet:  Not Applicable  Comments: Pt able to follow exercise prescription today without complaint.  Will continue to monitor for progression.    Dr. Emily Filbert is Medical Director for [place].  Dr. Ottie Glazier is Medical Director for [facility] Pulmonary Rehabilitation.

## 2020-10-06 ENCOUNTER — Other Ambulatory Visit: Payer: Self-pay

## 2020-10-06 DIAGNOSIS — C3490 Malignant neoplasm of unspecified part of unspecified bronchus or lung: Secondary | ICD-10-CM | POA: Diagnosis not present

## 2020-10-06 NOTE — Progress Notes (Signed)
Daily Session Note  Patient Details  Name: Levi [name] MRN: [id number] Date of Birth: 26-Nov-1940 Referring Provider:   Flowsheet Row Pulmonary Rehab from 08/31/2020 in [facility] Cardiac and Pulmonary Rehab  Referring Provider [name] MD      Encounter Date: 10/06/2020  Check In:  Session Check In - 10/06/20 0925      Check-In   Supervising physician immediately available to respond to emergencies See telemetry face sheet for immediately available ER MD    Location [facility]-Cardiac & Pulmonary Rehab    Staff Present Birdie Sons, MPA, RN;Amanda Oletta Darter, BA, ACSM CEP, Exercise Physiologist;Kara Eliezer Bottom, MS, ASCM CEP, Exercise Physiologist    Virtual Visit No    Medication changes reported     No    Fall or balance concerns reported    No    Tobacco Cessation No Change    Warm-up and Cool-down Performed on first and last piece of equipment    Resistance Training Performed Yes    VAD Patient? No    PAD/SET Patient? No      Pain Assessment   Currently in Pain? No/denies              Social History   Tobacco Use  Smoking Status Former Smoker  . Packs/day: 0.25  . Years: 62.00  . Pack years: 15.50  . Types: Cigarettes  . Quit date: 09/05/2018  . Years since quitting: 2.0  Smokeless Tobacco Never Used    Goals Met:  Independence with exercise equipment Exercise tolerated well No report of cardiac concerns or symptoms Strength training completed today  Goals Unmet:  Not Applicable  Comments: Pt able to follow exercise prescription today without complaint.  Will continue to monitor for progression.    Dr. Emily Filbert is Medical Director for [place].  Dr. Ottie Glazier is Medical Director for [facility] Pulmonary Rehabilitation.

## 2020-10-08 ENCOUNTER — Other Ambulatory Visit: Payer: Self-pay

## 2020-10-08 DIAGNOSIS — C3490 Malignant neoplasm of unspecified part of unspecified bronchus or lung: Secondary | ICD-10-CM | POA: Diagnosis not present

## 2020-10-08 NOTE — Progress Notes (Signed)
Daily Session Note  Patient Details  Name: Levi [name] MRN: [id number] Date of Birth: [birth year] Referring Provider:   Flowsheet Row Pulmonary Rehab from 08/31/2020 in [facility] Cardiac and Pulmonary Rehab  Referring Provider [name] MD       Encounter Date: 10/08/2020  Check In:  Session Check In - 10/08/20 0926       Check-In   Supervising physician immediately available to respond to emergencies See telemetry face sheet for immediately available ER MD    Location [facility]-Cardiac & Pulmonary Rehab    Staff Present Birdie Sons, MPA, Mauricia Area, BS, ACSM CEP, Exercise Physiologist;Amanda Oletta Darter, BA, ACSM CEP, Exercise Physiologist    Virtual Visit No    Medication changes reported     No    Fall or balance concerns reported    No    Tobacco Cessation No Change    Warm-up and Cool-down Performed on first and last piece of equipment    Resistance Training Performed Yes    VAD Patient? No    PAD/SET Patient? No      Pain Assessment   Currently in Pain? No/denies                Social History   Tobacco Use  Smoking Status Former   Packs/day: 0.25   Years: 62.00   Pack years: 15.50   Types: Cigarettes   Quit date: 09/05/2018   Years since quitting: 2.0  Smokeless Tobacco Never    Goals Met:  Independence with exercise equipment Exercise tolerated well No report of cardiac concerns or symptoms Strength training completed today  Goals Unmet:  Not Applicable  Comments: Pt able to follow exercise prescription today without complaint.  Will continue to monitor for progression. Reviewed home exercise with pt today.  [facility]  Reviewed THR, pulse, RPE, sign and symptoms, pulse oximetery and when to call 911 or MD.  Also discussed weather considerations and indoor options.  Pt voiced understanding.    Dr. Emily Filbert is Medical Director for [place].  Dr. Ottie Glazier is Medical Director for  [facility] Pulmonary Rehabilitation.

## 2020-10-13 ENCOUNTER — Other Ambulatory Visit: Payer: Self-pay

## 2020-10-13 DIAGNOSIS — C3490 Malignant neoplasm of unspecified part of unspecified bronchus or lung: Secondary | ICD-10-CM

## 2020-10-13 NOTE — Progress Notes (Signed)
Daily Session Note  Patient Details  Name: Levi [name] MRN: [id number] Date of Birth: 1941-02-14 Referring Provider:   Flowsheet Row Pulmonary Rehab from 08/31/2020 in [facility]  Referring Provider [name] MD       Encounter Date: 10/13/2020  Check In:  Session Check In - 10/13/20 0921       Check-In   Supervising physician immediately available to respond to emergencies See telemetry face sheet for immediately available ER MD    Location [facility]-Cardiac & Pulmonary Rehab    Staff Present Birdie Sons, MPA, RN;Laureen Owens Shark, BS, RRT, CPFT;Kara Eliezer Bottom, MS, ASCM CEP, Exercise Physiologist    Virtual Visit No    Medication changes reported     No    Fall or balance concerns reported    No    Tobacco Cessation No Change    Warm-up and Cool-down Performed on first and last piece of equipment    Resistance Training Performed Yes    VAD Patient? No    PAD/SET Patient? No      Pain Assessment   Currently in Pain? No/denies                Social History   Tobacco Use  Smoking Status Former   Packs/day: 0.25   Years: 62.00   Pack years: 15.50   Types: Cigarettes   Quit date: 09/05/2018   Years since quitting: 2.1  Smokeless Tobacco Never    Goals Met:  Independence with exercise equipment Exercise tolerated well No report of cardiac concerns or symptoms Strength training completed today  Goals Unmet:  Not Applicable  Comments: Pt able to follow exercise prescription today without complaint.  Will continue to monitor for progression.    Dr. Emily Filbert is Medical Director for [place].  Dr. Ottie Glazier is Medical Director for [facility].

## 2020-10-14 ENCOUNTER — Encounter: Payer: Self-pay | Admitting: *Deleted

## 2020-10-14 DIAGNOSIS — C3490 Malignant neoplasm of unspecified part of unspecified bronchus or lung: Secondary | ICD-10-CM

## 2020-10-14 NOTE — Progress Notes (Signed)
Pulmonary Individual Treatment Plan  Patient Details  Name: Levi [name] MRN: [id number] Date of Birth: 28-Sep-1940 Referring Provider:   Flowsheet Row Pulmonary Rehab from 08/31/2020 in [facility] Cardiac and Pulmonary Rehab  Referring Provider [name] MD       Initial Encounter Date:  Flowsheet Row Pulmonary Rehab from 08/31/2020 in [facility] Cardiac and Pulmonary Rehab  Date 08/31/20       Visit Diagnosis: Malignant neoplasm of lung, unspecified laterality, unspecified part of lung ([place])  Patient's Home Medications on Admission:  Current Outpatient Medications:    albuterol (PROVENTIL HFA;VENTOLIN HFA) 108 (90 Base) MCG/ACT inhaler, Inhale 2 puffs into the lungs every 6 (six) hours as needed for wheezing or shortness of breath., Disp: 1 Inhaler, Rfl: 0   apixaban (ELIQUIS) 5 MG TABS tablet, Take 1 tablet (5 mg total) by mouth 2 (two) times daily., Disp: 180 tablet, Rfl: 3   budesonide-formoterol (SYMBICORT) 160-4.5 MCG/ACT inhaler, Inhale 2 puffs into the lungs 2 (two) times daily., Disp: , Rfl:    diltiazem (DILACOR XR) 120 MG 24 hr capsule, Take 120 mg by mouth daily., Disp: , Rfl:    levothyroxine (SYNTHROID) 137 MCG tablet, Take 137 mcg by mouth daily before breakfast., Disp: , Rfl:    tamsulosin (FLOMAX) 0.4 MG CAPS capsule, Take 0.4 mg by mouth daily. 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[place])    Prostate enlargement    Shortness of breath    Squamous cell carcinoma of lung, left ([facility])    Thyroid disease    Tobacco abuse     Tobacco  Use: Social History   Tobacco Use  Smoking Status Former   Packs/day: 0.25   Years: 62.00   Pack years: 15.50   Types: Cigarettes   Quit date: 09/05/2018   Years since quitting: 2.1  Smokeless Tobacco Never    Labs: Recent Review Scientist, physiological     Labs for ITP Cardiac and Pulmonary Rehab Latest Ref Rng & Units 10/15/2018   PHART 7.350 - 7.450 7.42   PCO2ART 32.0 - 48.0 mmHg 35   HCO3 20.0 - 28.0 mmol/L 22.7   ACIDBASEDEF 0.0 - 2.0 mmol/L 1.3   O2SAT % 93.1        Pulmonary Assessment Scores:  Pulmonary Assessment Scores     Row Name 08/31/20 1237         ADL UCSD   ADL Phase Entry     SOB Score total 53     Rest 0     Walk 2     Stairs 4     Bath 3     Dress 3     Shop 2           CAT Score     CAT Score 26           mMRC Score     mMRC Score 3             UCSD:  Self-administered rating of dyspnea associated with activities of daily living (ADLs) 6-point scale (0 = "not at all" to 5 = "maximal or unable to do because of breathlessness")  Scoring Scores range from 0 to 120.  Minimally important difference is 5 units  CAT: CAT can identify the health impairment of COPD patients and is better correlated with disease progression.  CAT has a scoring range of zero to 40. The CAT score is classified into four groups of low (less than 10), medium (10 - 20), high (21-30) and very high (31-40) based on the impact level of disease on health status. A CAT score over 10 suggests significant symptoms.  A worsening CAT score could be explained by an exacerbation, poor medication adherence, poor inhaler technique, or progression of COPD or comorbid conditions.  CAT MCID is 2 points  mMRC: mMRC (Modified Medical Research Council) Dyspnea Scale is used to assess the degree of baseline functional disability in patients of respiratory disease due to dyspnea. No minimal important difference is established. A decrease in score of 1 point or greater is considered a  positive change.   Pulmonary Function Assessment:   Exercise Target Goals: Exercise Program Goal: Individual exercise prescription set using results from initial 6 min walk test and THRR while considering  patient's activity barriers and safety.   Exercise Prescription Goal: Initial exercise prescription builds to 30-45 minutes a day of aerobic activity, 2-3 days per week.  Home exercise guidelines will be given to patient during program as part of exercise prescription that the participant will acknowledge.  Education: Aerobic Exercise: - Group verbal and visual presentation on the components of exercise prescription. Introduces F.I.T.T principle from ACSM for exercise prescriptions.  Reviews F.I.T.T. principles of aerobic exercise including progression. Written material given at graduation.   Education: Resistance Exercise: - Group verbal and visual presentation on the components of exercise prescription. Introduces F.I.T.T principle from ACSM for exercise prescriptions  Reviews F.I.T.T. principles of resistance exercise including progression. Written material given at graduation.    Education: Exercise & Equipment Safety: - Individual verbal instruction and demonstration of equipment use and safety with use of the equipment. Flowsheet Row Pulmonary Rehab from 10/08/2020 in [facility] Cardiac and Pulmonary Rehab  Education need identified 08/31/20  Date 08/31/20  Educator [place]  Instruction Review Code 1- Verbalizes Understanding       Education: Exercise Physiology & General Exercise Guidelines: - Group verbal and written instruction with models to review the exercise physiology of the cardiovascular system and associated critical values. Provides general exercise guidelines with specific guidelines to those with heart or lung disease.    Education: Flexibility, Balance, Mind/Body Relaxation: - Group verbal and visual presentation with interactive activity on the components of exercise  prescription. Introduces F.I.T.T principle from ACSM for exercise prescriptions. Reviews F.I.T.T. principles of flexibility and balance exercise training including progression. Also discusses the mind body connection.  Reviews various relaxation techniques to help reduce and manage stress (i.e. Deep breathing, progressive muscle relaxation, and visualization). Balance handout provided to take home. Written material given at graduation. Flowsheet Row Pulmonary Rehab from 10/08/2020 in [facility] Cardiac and Pulmonary Rehab  Date 09/03/20  Educator AS  Instruction Review Code 1- Verbalizes Understanding       Activity Barriers & Risk Stratification:  Activity Barriers & Cardiac Risk Stratification - 08/31/20 1234       Activity Barriers & Cardiac Risk Stratification   Activity Barriers Shortness of Breath;Deconditioning;Muscular Weakness;Other (comment)    Comments Sciatica  6 Minute Walk:  6 Minute Walk     Row Name 08/31/20 1240         6 Minute Walk   Phase Initial     Distance 780 feet     Walk Time 6 minutes     # of Rest Breaks 0     MPH 1.47     METS 1.08     RPE 13     Perceived Dyspnea  1     VO2 Peak 3.81     Symptoms Yes (comment)     Comments Lower back pain 7/10     Resting HR 82 bpm     Resting BP 132/78     Resting Oxygen Saturation  94 %     Exercise Oxygen Saturation  during 6 min walk 86 %     Max Ex. HR 101 bpm     Max Ex. BP 144/76     2 Minute Post BP 124/78           Interval HR     1 Minute HR 93     2 Minute HR 97     3 Minute HR 98     4 Minute HR 99     5 Minute HR 101     6 Minute HR 100     2 Minute Post HR 87     Interval Heart Rate? Yes           Interval Oxygen     Interval Oxygen? Yes     Baseline Oxygen Saturation % 94 %     1 Minute Oxygen Saturation % 90 %     1 Minute Liters of Oxygen 0 L  RA     2 Minute Oxygen Saturation % 87 %     2 Minute Liters of Oxygen 0 L     3 Minute Oxygen Saturation % 86 %     3  Minute Liters of Oxygen 0 L     4 Minute Oxygen Saturation % 87 %     4 Minute Liters of Oxygen 0 L     5 Minute Oxygen Saturation % 86 %     5 Minute Liters of Oxygen 0 L     6 Minute Oxygen Saturation % 88 %     6 Minute Liters of Oxygen 0 L     2 Minute Post Oxygen Saturation % 94 %     2 Minute Post Liters of Oxygen 0 L            Oxygen Initial Assessment:  Oxygen Initial Assessment - 08/31/20 1237       Home Oxygen   Home Oxygen Device None    Sleep Oxygen Prescription None    Home Exercise Oxygen Prescription None    Home Resting Oxygen Prescription None      Initial 6 min Walk   Oxygen Used None      Program Oxygen Prescription   Program Oxygen Prescription None      Intervention   Short Term Goals To learn and understand importance of monitoring SPO2 with pulse oximeter and demonstrate accurate use of the pulse oximeter.;To learn and understand importance of maintaining oxygen saturations>88%;To learn and demonstrate proper pursed lip breathing techniques or other breathing techniques. ;To learn and demonstrate proper use of respiratory medications    Long  Term Goals Verbalizes importance of monitoring SPO2 with pulse oximeter and return demonstration;Maintenance of O2 saturations>88%;Exhibits proper  breathing techniques, such as pursed lip breathing or other method taught during program session;Compliance with respiratory medication;Demonstrates proper use of MDI's             Oxygen Re-Evaluation:  Oxygen Re-Evaluation     Row Name 09/03/20 0950             Program Oxygen Prescription   Program Oxygen Prescription None               Home Oxygen     Home Oxygen Device None       Sleep Oxygen Prescription None       Home Exercise Oxygen Prescription None       Home Resting Oxygen Prescription None               Goals/Expected Outcomes     Short Term Goals To learn and understand importance of monitoring SPO2 with pulse oximeter and demonstrate  accurate use of the pulse oximeter.;To learn and understand importance of maintaining oxygen saturations>88%;To learn and demonstrate proper pursed lip breathing techniques or other breathing techniques.        Long  Term Goals Verbalizes importance of monitoring SPO2 with pulse oximeter and return demonstration;Maintenance of O2 saturations>88%;Exhibits proper breathing techniques, such as pursed lip breathing or other method taught during program session;Exhibits compliance with exercise, home  and travel O2 prescription       Comments Reviewed PLB technique with pt.  Talked about how it works and it's importance in maintaining their exercise saturations.       Goals/Expected Outcomes Short: Become more profiecient at using PLB.   Long: Become independent at using PLB.               Oxygen Discharge (Final Oxygen Re-Evaluation):  Oxygen Re-Evaluation - 09/03/20 0950       Program Oxygen Prescription   Program Oxygen Prescription None      Home Oxygen   Home Oxygen Device None    Sleep Oxygen Prescription None    Home Exercise Oxygen Prescription None    Home Resting Oxygen Prescription None      Goals/Expected Outcomes   Short Term Goals To learn and understand importance of monitoring SPO2 with pulse oximeter and demonstrate accurate use of the pulse oximeter.;To learn and understand importance of maintaining oxygen saturations>88%;To learn and demonstrate proper pursed lip breathing techniques or other breathing techniques.     Long  Term Goals Verbalizes importance of monitoring SPO2 with pulse oximeter and return demonstration;Maintenance of O2 saturations>88%;Exhibits proper breathing techniques, such as pursed lip breathing or other method taught during program session;Exhibits compliance with exercise, home  and travel O2 prescription    Comments Reviewed PLB technique with pt.  Talked about how it works and it's importance in maintaining their exercise saturations.     Goals/Expected Outcomes Short: Become more profiecient at using PLB.   Long: Become independent at using PLB.             Initial Exercise Prescription:  Initial Exercise Prescription - 08/31/20 1200       Date of Initial Exercise RX and Referring Provider   Date 08/31/20    Referring Provider [name] MD      Recumbant Bike   Level 1    RPM 60    Watts 10    Minutes 15    METs 1      NuStep   Level 1    SPM 80    Minutes 15  METs 1      Track   Laps 10    Minutes 15    METs 1      Prescription Details   Frequency (times per week) 2    Duration Progress to 30 minutes of continuous aerobic without signs/symptoms of physical distress      Intensity   THRR 40-80% of Max Heartrate 106-130    Ratings of Perceived Exertion 11-13    Perceived Dyspnea 0-4      Progression   Progression Continue to progress workloads to maintain intensity without signs/symptoms of physical distress.      Resistance Training   Training Prescription Yes    Weight 3 lb    Reps 10-15             Perform Capillary Blood Glucose checks as needed.  Exercise Prescription Changes:   Exercise Prescription Changes     Row Name 08/31/20 1200 09/08/20 1300 09/23/20 1400 10/06/20 1400       Response to Exercise   Blood Pressure (Admit) 132/78 128/82 128/76 146/80    Blood Pressure (Exercise) 144/76 146/64 142/78 130/76    Blood Pressure (Exit) 124/78 144/74 128/76 116/70    Heart Rate (Admit) 82 bpm 88 bpm 76 bpm 87 bpm    Heart Rate (Exercise) 101 bpm 97 bpm 95 bpm 96 bpm    Heart Rate (Exit) 87 bpm 88 bpm 86 bpm 94 bpm    Oxygen Saturation (Admit) 94 % 90 % 92 % 90 %    Oxygen Saturation (Exercise) 86 % 89 % 89 % 91 %    Oxygen Saturation (Exit) 94 % 92 % 93 % 93 %    Rating of Perceived Exertion (Exercise) 13 13 13 13     Perceived Dyspnea (Exercise) 1 3 2 2     Symptoms Back pain 7/10, SOB none none none    Comments walk test results -- -- --    Duration -- Progress  to 30 minutes of  aerobic without signs/symptoms of physical distress Continue with 30 min of aerobic exercise without signs/symptoms of physical distress. Continue with 30 min of aerobic exercise without signs/symptoms of physical distress.    Intensity -- THRR unchanged THRR unchanged THRR unchanged         Progression        Progression -- Continue to progress workloads to maintain intensity without signs/symptoms of physical distress. Continue to progress workloads to maintain intensity without signs/symptoms of physical distress. Continue to progress workloads to maintain intensity without signs/symptoms of physical distress.    Average METs -- 2 2.15 2.5         Resistance Training        Training Prescription -- Yes Yes Yes    Weight -- 3 lb 3 lb 5 lb    Reps -- 10-15 10-15 10-15         Interval Training        Interval Training -- -- No No         NuStep        Level -- -- 3 4    Minutes -- -- 15 15    METs -- -- 2.3 2.5         Biostep-RELP        Level -- 2 -- --    Minutes -- 15 -- --    METs -- 2 -- --         Track  Laps -- $Remov'5 19 17    'OxjoVu$ Minutes -- $RemoveBe'15 15 15    'bUWdvGdoo$ METs -- -- 2 --            Exercise Comments:   Exercise Comments     Row Name 09/03/20 0949           Exercise Comments First full day of exercise!  Patient was oriented to gym and equipment including functions, settings, policies, and procedures.  Patient's individual exercise prescription and treatment plan were reviewed.  All starting workloads were established based on the results of the 6 minute walk test done at initial orientation visit.  The plan for exercise progression was also introduced and progression will be customized based on patient's performance and goals.                Exercise Goals and Review:   Exercise Goals     Row Name 08/31/20 1254             Exercise Goals   Increase Physical Activity Yes       Intervention Provide advice, education, support and  counseling about physical activity/exercise needs.;Develop an individualized exercise prescription for aerobic and resistive training based on initial evaluation findings, risk stratification, comorbidities and participant's personal goals.       Expected Outcomes Short Term: Attend rehab on a regular basis to increase amount of physical activity.;Long Term: Add in home exercise to make exercise part of routine and to increase amount of physical activity.;Long Term: Exercising regularly at least 3-5 days a week.       Increase Strength and Stamina Yes       Intervention Provide advice, education, support and counseling about physical activity/exercise needs.;Develop an individualized exercise prescription for aerobic and resistive training based on initial evaluation findings, risk stratification, comorbidities and participant's personal goals.       Expected Outcomes Short Term: Increase workloads from initial exercise prescription for resistance, speed, and METs.;Short Term: Perform resistance training exercises routinely during rehab and add in resistance training at home;Long Term: Improve cardiorespiratory fitness, muscular endurance and strength as measured by increased METs and functional capacity (6MWT)       Able to understand and use rate of perceived exertion (RPE) scale Yes       Intervention Provide education and explanation on how to use RPE scale       Expected Outcomes Short Term: Able to use RPE daily in rehab to express subjective intensity level;Long Term:  Able to use RPE to guide intensity level when exercising independently       Able to understand and use Dyspnea scale Yes       Intervention Provide education and explanation on how to use Dyspnea scale       Expected Outcomes Short Term: Able to use Dyspnea scale daily in rehab to express subjective sense of shortness of breath during exertion;Long Term: Able to use Dyspnea scale to guide intensity level when exercising independently        Knowledge and understanding of Target Heart Rate Range (THRR) Yes       Intervention Provide education and explanation of THRR including how the numbers were predicted and where they are located for reference       Expected Outcomes Short Term: Able to state/look up THRR;Short Term: Able to use daily as guideline for intensity in rehab;Long Term: Able to use THRR to govern intensity when exercising independently       Able to check  pulse independently Yes       Intervention Provide education and demonstration on how to check pulse in carotid and radial arteries.;Review the importance of being able to check your own pulse for safety during independent exercise       Expected Outcomes Short Term: Able to explain why pulse checking is important during independent exercise;Long Term: Able to check pulse independently and accurately       Understanding of Exercise Prescription Yes       Intervention Provide education, explanation, and written materials on patient's individual exercise prescription       Expected Outcomes Short Term: Able to explain program exercise prescription;Long Term: Able to explain home exercise prescription to exercise independently                Exercise Goals Re-Evaluation :  Exercise Goals Re-Evaluation     Row Name 09/03/20 0949 09/23/20 1454 10/06/20 1424 10/08/20 0944       Exercise Goal Re-Evaluation   Exercise Goals Review Increase Physical Activity;Able to understand and use rate of perceived exertion (RPE) scale;Knowledge and understanding of Target Heart Rate Range (THRR);Understanding of Exercise Prescription;Increase Strength and Stamina;Able to understand and use Dyspnea scale;Able to check pulse independently Increase Physical Activity;Increase Strength and Stamina;Understanding of Exercise Prescription Increase Physical Activity;Increase Strength and Stamina Increase Physical Activity;Increase Strength and Stamina    Comments Reviewed RPE and dyspnea  scales, THR and program prescription with pt today.  Pt voiced understanding and was given a copy of goals to take home. Kyaire is off to a good start in rehab.  He is up to 19 laps on the track. He did drop back to level 1 on the NuStep and we will try to get him back up to level 3.  We will continue to monitor his progress. Raydel has progressed to level 4 on NS.  He has also increased to 5 lb for strength work.  We will monitor progress. Reviewed home exercise with pt today.  [facility]  Reviewed THR, pulse, RPE, sign and symptoms, pulse oximetery and when to call 911 or MD.  Also discussed weather considerations and indoor options.  Pt voiced understanding.    Expected Outcomes Short: Use RPE daily to regulate intensity. Long: Follow program prescription in THR. Short: Continue to attend rehab regularly Long: Conitnue to follow program prescription Short: reach 20 laps on track Long:  continue to build stamina Short:  work up to walking 30 min at home Long:  continue to build stamina             Discharge Exercise Prescription (Final Exercise Prescription Changes):  Exercise Prescription Changes - 10/06/20 1400       Response to Exercise   Blood Pressure (Admit) 146/80    Blood Pressure (Exercise) 130/76    Blood Pressure (Exit) 116/70    Heart Rate (Admit) 87 bpm    Heart Rate (Exercise) 96 bpm    Heart Rate (Exit) 94 bpm    Oxygen Saturation (Admit) 90 %    Oxygen Saturation (Exercise) 91 %    Oxygen Saturation (Exit) 93 %    Rating of Perceived Exertion (Exercise) 13    Perceived Dyspnea (Exercise) 2    Symptoms none    Duration Continue with 30 min of aerobic exercise without signs/symptoms of physical distress.    Intensity THRR unchanged      Progression   Progression Continue to progress workloads to  maintain intensity without signs/symptoms of physical distress.    Average METs 2.5      Resistance Training   Training Prescription Yes     Weight 5 lb    Reps 10-15      Interval Training   Interval Training No      NuStep   Level 4    Minutes 15    METs 2.5      Track   Laps 17    Minutes 15             Nutrition:  Target Goals: Understanding of nutrition guidelines, daily intake of sodium '1500mg'$ , cholesterol '200mg'$ , calories 30% from fat and 7% or less from saturated fats, daily to have 5 or more servings of fruits and vegetables.  Education: All About Nutrition: -Group instruction provided by verbal, written material, interactive activities, discussions, models, and posters to present general guidelines for heart healthy nutrition including fat, fiber, MyPlate, the role of sodium in heart healthy nutrition, utilization of the nutrition label, and utilization of this knowledge for meal planning. Follow up email sent as well. Written material given at graduation. Flowsheet Row Pulmonary Rehab from 10/08/2020 in [facility] Cardiac and Pulmonary Rehab  Date 09/10/20  Educator [facility]  Instruction Review Code 1- Verbalizes Understanding       Biometrics:  Pre Biometrics - 08/31/20 1233       Pre Biometrics   Height 5' 10.75" (1.797 m)    Weight 271 lb 9.6 oz (123.2 kg)    BMI (Calculated) 38.15    Single Leg Stand 8.16 seconds              Nutrition Therapy Plan and Nutrition Goals:  Nutrition Therapy & Goals - 09/17/20 1146       Nutrition Therapy   RD appointment deferred Yes   Pookela does not want to meet with the RD at this time - will continue to check in.            Nutrition Assessments:  MEDIFICTS Score Key: ?70 Need to make dietary changes  40-70 Heart Healthy Diet ? 40 Therapeutic Level Cholesterol Diet  Flowsheet Row Pulmonary Rehab from 08/31/2020 in [facility] Cardiac and Pulmonary Rehab  Picture Your Plate Total Score on Admission 54      Picture Your Plate Scores: <52 Unhealthy dietary pattern with much room for improvement. 41-50 Dietary pattern unlikely to meet recommendations  for good health and room for improvement. 51-60 More healthful dietary pattern, with some room for improvement.  >60 Healthy dietary pattern, although there may be some specific behaviors that could be improved.   Nutrition Goals Re-Evaluation:  Nutrition Goals Re-Evaluation     [place] Name 10/08/20 0933             Goals   Comment Rashon has not met with RD yet                Nutrition Goals Discharge (Final Nutrition Goals Re-Evaluation):  Nutrition Goals Re-Evaluation - 10/08/20 0933       Goals   Comment Roberto has not met with RD yet             Psychosocial: Target Goals: Acknowledge presence or absence of significant depression and/or stress, maximize coping skills, provide positive support system. Participant is able to verbalize types and ability to use techniques and skills needed for reducing stress and depression.   Education: Stress, Anxiety, and Depression - Group verbal and visual presentation to define topics  covered.  Reviews how body is impacted by stress, anxiety, and depression.  Also discusses healthy ways to reduce stress and to treat/manage anxiety and depression.  Written material given at graduation. Flowsheet Row Pulmonary Rehab from 10/08/2020 in [facility] Cardiac and Pulmonary Rehab  Date 10/08/20  Educator [facility]  Instruction Review Code 1- [employer] Understanding       Education: Sleep Hygiene -Provides group verbal and written instruction about how sleep can affect your health.  Define sleep hygiene, discuss sleep cycles and impact of sleep habits. Review good sleep hygiene tips.    Initial Review & Psychosocial Screening:  Initial Psych Review & Screening - 08/28/20 1310       Initial Review   Current issues with Current Sleep Concerns;Current Stress Concerns   sleeps on and off all day; sleeps in bed from 3am-10am.   Source of Stress Concerns Unable to participate in former interests or hobbies;Unable to perform yard/household activities       [place]? Yes   wife   Concerns Inappropriate over/under dependence on family/friends      Barriers   Psychosocial barriers to participate in program There are no identifiable barriers or psychosocial needs.;The patient should benefit from training in stress management and relaxation.      Screening Interventions   Interventions Encouraged to exercise;Provide feedback about the scores to participant;To provide support and resources with identified psychosocial needs    Expected Outcomes Short Term goal: Utilizing psychosocial counselor, staff and physician to assist with identification of specific Stressors or current issues interfering with healing process. Setting desired goal for each stressor or current issue identified.;Long Term Goal: Stressors or current issues are controlled or eliminated.;Short Term goal: Identification and review with participant of any Quality of Life or Depression concerns found by scoring the questionnaire.;Long Term goal: The participant improves quality of Life and PHQ9 Scores as seen by post scores and/or verbalization of changes             Quality of Life Scores:  Scores of 19 and below usually indicate a poorer quality of life in these areas.  A difference of  2-3 points is a clinically meaningful difference.  A difference of 2-3 points in the total score of the Quality of Life Index has been associated with significant improvement in overall quality of life, self-image, physical symptoms, and general health in studies assessing change in quality of life.  PHQ-9: Recent Review Flowsheet Data     Depression screen [facility] 2/9 08/31/2020 10/20/2015 10/16/2015 01/07/2015 12/19/2014   Decreased Interest 1 0 0 0 0   Down, Depressed, Hopeless 0 0 0 0 0   PHQ - 2 Score 1 0 0 0 0   Altered sleeping 0 - - - -   Tired, decreased energy 1 - - - -   Change in appetite 0 - - - -   Feeling bad or failure about yourself  0 - - - -    Trouble concentrating 0 - - - -   Moving slowly or fidgety/restless 0 - - - -   Suicidal thoughts 0 - - - -   PHQ-9 Score 2 - - - -   Difficult doing work/chores Not difficult at all - - - -      Interpretation of Total Score  Total Score Depression Severity:  1-4 = Minimal depression, 5-9 = Mild depression, 10-14 = Moderate depression, 15-19 = Moderately severe depression, 20-27 = Severe depression  Psychosocial Evaluation and Intervention:  Psychosocial Evaluation - 10/08/20 0937       Psychosocial Evaluation & Interventions   Comments Robt is getting up and around more.  He feels like he can start mowing some on a riding mower.  We discussed heat concerns and not starting with too much time mowing etc.  He goes to sleep between 12 - 2 am and sleeps until 9 or 10 am.  He feels rested.    Expected Outcomes Short:  continue to exercise to build stamina Long: maintain good sleep habits             Psychosocial Re-Evaluation:   Psychosocial Discharge (Final Psychosocial Re-Evaluation):   Education: Education Goals: Education classes will be provided on a weekly basis, covering required topics. Participant will state understanding/return demonstration of topics presented.  Learning Barriers/Preferences:  Learning Barriers/Preferences - 08/28/20 1305       Learning Barriers/Preferences   Learning Barriers Hearing    Learning Preferences None             General Pulmonary Education Topics:  Infection Prevention: - Provides verbal and written material to individual with discussion of infection control including proper hand washing and proper equipment cleaning during exercise session. Flowsheet Row Pulmonary Rehab from 10/08/2020 in [facility] Cardiac and Pulmonary Rehab  Education need identified 08/31/20  Date 08/31/20  Educator [place]  Instruction Review Code 1- Verbalizes Understanding       Falls Prevention: - Provides verbal and written material to individual  with discussion of falls prevention and safety. Flowsheet Row Pulmonary Rehab from 10/08/2020 in [facility] Cardiac and Pulmonary Rehab  Education need identified 08/31/20  Date 08/31/20  Educator [place]  Instruction Review Code 1- Verbalizes Understanding       Chronic Lung Disease Review: - Group verbal instruction with posters, models, PowerPoint presentations and videos,  to review new updates, new respiratory medications, new advancements in procedures and treatments. Providing information on websites and "800" numbers for continued self-education. Includes information about supplement oxygen, available portable oxygen systems, continuous and intermittent flow rates, oxygen safety, concentrators, and Medicare reimbursement for oxygen. Explanation of Pulmonary Drugs, including class, frequency, complications, importance of spacers, rinsing mouth after steroid MDI's, and proper cleaning methods for nebulizers. Review of basic lung anatomy and physiology related to function, structure, and complications of lung disease. Review of risk factors. Discussion about methods for diagnosing sleep apnea and types of masks and machines for OSA. Includes a review of the use of types of environmental controls: home humidity, furnaces, filters, dust mite/pet prevention, HEPA vacuums. Discussion about weather changes, air quality and the benefits of nasal washing. Instruction on Warning signs, infection symptoms, calling MD promptly, preventive modes, and value of vaccinations. Review of effective airway clearance, coughing and/or vibration techniques. Emphasizing that all should Create an Action Plan. Written material given at graduation. Flowsheet Row Pulmonary Rehab from 10/08/2020 in [facility] Cardiac and Pulmonary Rehab  Date 10/01/20  Educator [facility]  Instruction Review Code 1- Verbalizes Understanding       AED/CPR: - Group verbal and written instruction with the use of models to demonstrate the basic use of the AED with  the basic ABC's of resuscitation.    Anatomy and Cardiac Procedures: - Group verbal and visual presentation and models provide information about basic cardiac anatomy and function. Reviews the testing methods done to diagnose heart disease and the outcomes of the test results. Describes the treatment choices: Medical Management, Angioplasty, or Coronary Bypass Surgery for treating  various heart conditions including Myocardial Infarction, Angina, Valve Disease, and Cardiac Arrhythmias.  Written material given at graduation.   Medication Safety: - Group verbal and visual instruction to review commonly prescribed medications for heart and lung disease. Reviews the medication, class of the drug, and side effects. Includes the steps to properly store meds and maintain the prescription regimen.  Written material given at graduation. Flowsheet Row Pulmonary Rehab from 10/08/2020 in [facility] Cardiac and Pulmonary Rehab  Date 09/17/20  Educator [place]  Instruction Review Code 1- Verbalizes Understanding       Other: -Provides group and verbal instruction on various topics (see comments)   Knowledge Questionnaire Score:  Knowledge Questionnaire Score - 08/31/20 1235       Knowledge Questionnaire Score   Pre Score 16/18: Oxygen, Lung disease              Core Components/Risk Factors/Patient Goals at Admission:  Personal Goals and Risk Factors at Admission - 08/31/20 1255       Core Components/Risk Factors/Patient Goals on Admission    Weight Management Yes;Weight Loss    Intervention Weight Management: Develop a combined nutrition and exercise program designed to reach desired caloric intake, while maintaining appropriate intake of nutrient and fiber, sodium and fats, and appropriate energy expenditure required for the weight goal.;Weight Management: Provide education and appropriate resources to help participant work on and attain dietary goals.;Weight Management/Obesity: Establish reasonable  short term and long term weight goals.    Admit Weight 271 lb (122.9 kg)    Goal Weight: Short Term 265 lb (120.2 kg)    Goal Weight: Long Term 250 lb (113.4 kg)    Expected Outcomes Short Term: Continue to assess and modify interventions until short term weight is achieved;Long Term: Adherence to nutrition and physical activity/exercise program aimed toward attainment of established weight goal;Weight Loss: Understanding of general recommendations for a balanced deficit meal plan, which promotes 1-2 lb weight loss per week and includes a negative energy balance of [phone] kcal/d;Understanding recommendations for meals to include 15-35% energy as protein, 25-35% energy from fat, 35-60% energy from carbohydrates, less than $RemoveB'200mg'TIzuAKAe$  of dietary cholesterol, 20-35 gm of total fiber daily;Understanding of distribution of calorie intake throughout the day with the consumption of 4-5 meals/snacks    Improve shortness of breath with ADL's Yes    Intervention Provide education, individualized exercise plan and daily activity instruction to help decrease symptoms of SOB with activities of daily living.    Expected Outcomes Short Term: Improve cardiorespiratory fitness to achieve a reduction of symptoms when performing ADLs;Long Term: Be able to perform more ADLs without symptoms or delay the onset of symptoms    Hypertension Yes    Intervention Provide education on lifestyle modifcations including regular physical activity/exercise, weight management, moderate sodium restriction and increased consumption of fresh fruit, vegetables, and low fat dairy, alcohol moderation, and smoking cessation.;Monitor prescription use compliance.    Expected Outcomes Short Term: Continued assessment and intervention until BP is < 140/15mm HG in hypertensive participants. < 130/4mm HG in hypertensive participants with diabetes, heart failure or chronic kidney disease.;Long Term: Maintenance of blood pressure at goal levels.              Education:Diabetes - Individual verbal and written instruction to review signs/symptoms of diabetes, desired ranges of glucose level fasting, after meals and with exercise. Acknowledge that pre and post exercise glucose checks will be done for 3 sessions at entry of program.   Know Your Numbers and Heart  Failure: - Group verbal and visual instruction to discuss disease risk factors for cardiac and pulmonary disease and treatment options.  Reviews associated critical values for Overweight/Obesity, Hypertension, Cholesterol, and Diabetes.  Discusses basics of heart failure: signs/symptoms and treatments.  Introduces Heart Failure Zone chart for action plan for heart failure.  Written material given at graduation. Flowsheet Row Pulmonary Rehab from 10/08/2020 in [facility] Cardiac and Pulmonary Rehab  Date 09/24/20  Educator [place]  Instruction Review Code 1- Verbalizes Understanding       Core Components/Risk Factors/Patient Goals Review:   Goals and Risk Factor Review     Row Name 10/08/20 [phone]             Core Components/Risk Factors/Patient Goals Review   Personal Goals Review Weight Management/Obesity;Improve shortness of breath with ADL's       Review Richards weight is down 3-4 lb since he started.  He is walking at home as well.  He feels like he can get out and mow the grass some now. (riding mower)       Expected Outcomes Short: continue to exercise consistently and monitor weight Long:  reach goal weight                Core Components/Risk Factors/Patient Goals at Discharge (Final Review):   Goals and Risk Factor Review - 10/08/20 0928       Core Components/Risk Factors/Patient Goals Review   Personal Goals Review Weight Management/Obesity;Improve shortness of breath with ADL's    Review [place] weight is down 3-4 lb since he started.  He is walking at home as well.  He feels like he can get out and mow the grass some now. (riding mower)    Expected Outcomes Short:  continue to exercise consistently and monitor weight Long:  reach goal weight             ITP Comments:  ITP Comments     Row Name 08/28/20 1320 08/31/20 1238 09/03/20 0938 09/16/20 0708 10/14/20 0805   ITP Comments Initial telephone orientation completed. Diagnosis can be found in [facility] 3/17. EP orientation scheduled for 5/2 at 10:30am. Completed 6MWT and gym orientation. Initial ITP created and sent for review to Dr. Emily Filbert, Medical Director. First full day of exercise!  Patient was oriented to gym and equipment including functions, settings, policies, and procedures.  Patient's individual exercise prescription and treatment plan were reviewed.  All starting workloads were established based on the results of the 6 minute walk test done at initial orientation visit.  The plan for exercise progression was also introduced and progression will be customized based on patient's performance and goals. 30 Day review completed. Medical Director ITP review done, changes made as directed, and signed approval by Medical Director.  New to program 30 Day review completed. Medical Director ITP review done, changes made as directed, and signed approval by Medical Director.            Comments:

## 2020-10-15 ENCOUNTER — Encounter: Payer: No Typology Code available for payment source | Admitting: *Deleted

## 2020-10-15 ENCOUNTER — Other Ambulatory Visit: Payer: Self-pay

## 2020-10-15 DIAGNOSIS — C3490 Malignant neoplasm of unspecified part of unspecified bronchus or lung: Secondary | ICD-10-CM | POA: Diagnosis not present

## 2020-10-15 NOTE — Progress Notes (Signed)
Daily Session Note  Patient Details  Name: Levi [name] MRN: [id number] Date of Birth: 03/11/1941 Referring Provider:   Flowsheet Row Pulmonary Rehab from 08/31/2020 in [facility] Cardiac and Pulmonary Rehab  Referring Provider [name] MD       Encounter Date: 10/15/2020  Check In:  Session Check In - 10/15/20 1004       Check-In   Supervising physician immediately available to respond to emergencies See telemetry face sheet for immediately available ER MD    Location [facility]-Cardiac & Pulmonary Rehab    Staff Present Heath Lark, RN, BSN, CCRP;Joseph Hood RCP,RRT,BSRT;Kelly Cowiche, BS, ACSM CEP, Exercise Physiologist;Amanda Oletta Darter, [place], ACSM CEP, Exercise Physiologist    Virtual Visit No    Medication changes reported     No    Fall or balance concerns reported    No    Warm-up and Cool-down Performed on first and last piece of equipment    Resistance Training Performed Yes    VAD Patient? No    PAD/SET Patient? No      Pain Assessment   Currently in Pain? No/denies                Social History   Tobacco Use  Smoking Status Former   Packs/day: 0.25   Years: 62.00   Pack years: 15.50   Types: Cigarettes   Quit date: 09/05/2018   Years since quitting: 2.1  Smokeless Tobacco Never    Goals Met:  Proper associated with RPD/PD & O2 Sat Independence with exercise equipment Exercise tolerated well No report of cardiac concerns or symptoms  Goals Unmet:  Not Applicable  Comments: Pt able to follow exercise prescription today without complaint.  Will continue to monitor for progression.    Dr. Emily Filbert is Medical Director for [place].  Dr. Ottie Glazier is Medical Director for [facility] Pulmonary Rehabilitation.

## 2020-10-20 ENCOUNTER — Other Ambulatory Visit: Payer: Self-pay

## 2020-10-20 DIAGNOSIS — C3490 Malignant neoplasm of unspecified part of unspecified bronchus or lung: Secondary | ICD-10-CM | POA: Diagnosis not present

## 2020-10-20 NOTE — Progress Notes (Signed)
Daily Session Note  Patient Details  Name: Levi [name] MRN: [id number] Date of Birth: 1940-09-22 Referring Provider:   Flowsheet Row Pulmonary Rehab from 08/31/2020 in [facility] Cardiac and Pulmonary Rehab  Referring Provider [name] MD       Encounter Date: 10/20/2020  Check In:  Session Check In - 10/20/20 0942       Check-In   Supervising physician immediately available to respond to emergencies See telemetry face sheet for immediately available ER MD    Location [facility]-Cardiac & Pulmonary Rehab    Staff Present Birdie Sons, MPA, RN;Amanda Oletta Darter, BA, ACSM CEP, Exercise Physiologist;Kara Eliezer Bottom, MS, ASCM CEP, Exercise Physiologist    Virtual Visit No    Medication changes reported     No    Fall or balance concerns reported    No    Tobacco Cessation No Change    Warm-up and Cool-down Performed on first and last piece of equipment    Resistance Training Performed Yes    VAD Patient? No    PAD/SET Patient? No      Pain Assessment   Currently in Pain? No/denies                Social History   Tobacco Use  Smoking Status Former   Packs/day: 0.25   Years: 62.00   Pack years: 15.50   Types: Cigarettes   Quit date: 09/05/2018   Years since quitting: 2.1  Smokeless Tobacco Never    Goals Met:  Independence with exercise equipment Exercise tolerated well No report of cardiac concerns or symptoms Strength training completed today  Goals Unmet:  Not Applicable  Comments: Pt able to follow exercise prescription today without complaint.  Will continue to monitor for progression.    Dr. Emily Filbert is Medical Director for [place].  Dr. Ottie Glazier is Medical Director for [facility] Pulmonary Rehabilitation.

## 2020-10-22 ENCOUNTER — Other Ambulatory Visit: Payer: Self-pay

## 2020-10-22 DIAGNOSIS — C3490 Malignant neoplasm of unspecified part of unspecified bronchus or lung: Secondary | ICD-10-CM

## 2020-10-22 NOTE — Progress Notes (Signed)
Daily Session Note  Patient Details  Name: Levi [name] MRN: [id number] Date of Birth: 06-04-1940 Referring Provider:   Flowsheet Row Pulmonary Rehab from 08/31/2020 in [facility] Cardiac and Pulmonary Rehab  Referring Provider [name] MD       Encounter Date: 10/22/2020  Check In:  Session Check In - 10/22/20 0916       Check-In   Supervising physician immediately available to respond to emergencies See telemetry face sheet for immediately available ER MD    Location [facility]-Cardiac & Pulmonary Rehab    Staff Present Birdie Sons, MPA, [place], BS, ACSM CEP, Exercise Physiologist;Amanda Oletta Darter, BA, ACSM CEP, Exercise Physiologist    Virtual Visit No    Medication changes reported     No    Fall or balance concerns reported    No    Tobacco Cessation No Change    Warm-up and Cool-down Performed on first and last piece of equipment    Resistance Training Performed Yes    VAD Patient? No    PAD/SET Patient? No      Pain Assessment   Currently in Pain? No/denies                Social History   Tobacco Use  Smoking Status Former   Packs/day: 0.25   Years: 62.00   Pack years: 15.50   Types: Cigarettes   Quit date: 09/05/2018   Years since quitting: 2.1  Smokeless Tobacco Never    Goals Met:  Independence with exercise equipment Exercise tolerated well No report of cardiac concerns or symptoms Strength training completed today  Goals Unmet:  Not Applicable  Comments: Pt able to follow exercise prescription today without complaint.  Will continue to monitor for progression.    Dr. Emily Filbert is Medical Director for [place].  Dr. Ottie Glazier is Medical Director for [facility] Pulmonary Rehabilitation.

## 2020-10-27 ENCOUNTER — Other Ambulatory Visit: Payer: Self-pay

## 2020-10-27 DIAGNOSIS — C3490 Malignant neoplasm of unspecified part of unspecified bronchus or lung: Secondary | ICD-10-CM | POA: Diagnosis not present

## 2020-10-27 NOTE — Progress Notes (Signed)
Daily Session Note  Patient Details  Name: Levi [name] MRN: [id number] Date of Birth: 06-26-1940 Referring Provider:   Flowsheet Row Pulmonary Rehab from 08/31/2020 in [facility] Cardiac and Pulmonary Rehab  Referring Provider [name] MD       Encounter Date: 10/27/2020  Check In:  Session Check In - 10/27/20 0917       Check-In   Supervising physician immediately available to respond to emergencies See telemetry face sheet for immediately available ER MD    Location [facility]-Cardiac & Pulmonary Rehab    Staff Present Birdie Sons, MPA, RN;Amanda Oletta Darter, BA, ACSM CEP, Exercise Physiologist;Kara Eliezer Bottom, MS, ASCM CEP, Exercise Physiologist    Virtual Visit No    Medication changes reported     No    Fall or balance concerns reported    No    Tobacco Cessation No Change    Warm-up and Cool-down Performed on first and last piece of equipment    Resistance Training Performed Yes    VAD Patient? No    PAD/SET Patient? No      Pain Assessment   Currently in Pain? No/denies                Social History   Tobacco Use  Smoking Status Former   Packs/day: 0.25   Years: 62.00   Pack years: 15.50   Types: Cigarettes   Quit date: 09/05/2018   Years since quitting: 2.1  Smokeless Tobacco Never    Goals Met:  Independence with exercise equipment Exercise tolerated well No report of cardiac concerns or symptoms Strength training completed today  Goals Unmet:  Not Applicable  Comments: Pt able to follow exercise prescription today without complaint.  Will continue to monitor for progression.    Dr. Emily Filbert is Medical Director for [place].  Dr. Ottie Glazier is Medical Director for [facility] Pulmonary Rehabilitation.

## 2020-10-29 ENCOUNTER — Other Ambulatory Visit: Payer: Self-pay

## 2020-10-29 DIAGNOSIS — C3490 Malignant neoplasm of unspecified part of unspecified bronchus or lung: Secondary | ICD-10-CM

## 2020-10-29 NOTE — Progress Notes (Signed)
Daily Session Note  Patient Details  Name: Levi [name] MRN: [id number] Date of Birth: Sep 17, 1940 Referring Provider:   Flowsheet Row Pulmonary Rehab from 08/31/2020 in [facility] Cardiac and Pulmonary Rehab  Referring Provider [name] MD       Encounter Date: 10/29/2020  Check In:  Session Check In - 10/29/20 0916       Check-In   Supervising physician immediately available to respond to emergencies See telemetry face sheet for immediately available ER MD    Location [facility]-Cardiac & Pulmonary Rehab    Staff Present Birdie Sons, MPA, [place], BS, ACSM CEP, Exercise Physiologist;Amanda Oletta Darter, BA, ACSM CEP, Exercise Physiologist    Virtual Visit No    Medication changes reported     No    Fall or balance concerns reported    No    Tobacco Cessation No Change    Warm-up and Cool-down Performed on first and last piece of equipment    Resistance Training Performed Yes    VAD Patient? No    PAD/SET Patient? No      Pain Assessment   Currently in Pain? No/denies                Social History   Tobacco Use  Smoking Status Former   Packs/day: 0.25   Years: 62.00   Pack years: 15.50   Types: Cigarettes   Quit date: 09/05/2018   Years since quitting: 2.1  Smokeless Tobacco Never    Goals Met:  Independence with exercise equipment Exercise tolerated well No report of cardiac concerns or symptoms Strength training completed today  Goals Unmet:  Not Applicable  Comments: Pt able to follow exercise prescription today without complaint.  Will continue to monitor for progression.    Dr. Emily Filbert is Medical Director for [place].  Dr. Ottie Glazier is Medical Director for [facility] Pulmonary Rehabilitation.

## 2020-11-03 ENCOUNTER — Encounter: Payer: No Typology Code available for payment source | Attending: Internal Medicine | Admitting: *Deleted

## 2020-11-03 ENCOUNTER — Other Ambulatory Visit: Payer: Self-pay

## 2020-11-03 DIAGNOSIS — C3490 Malignant neoplasm of unspecified part of unspecified bronchus or lung: Secondary | ICD-10-CM | POA: Insufficient documentation

## 2020-11-03 NOTE — Progress Notes (Signed)
Daily Session Note  Patient Details  Name: BABAK LUCUS MRN: [id number] Date of Birth: 08/25/1940 Referring Provider:   Flowsheet Row Pulmonary Rehab from 08/31/2020 in [facility] Cardiac and Pulmonary Rehab  Referring Provider [name] MD       Encounter Date: 11/03/2020  Check In:  Session Check In - 11/03/20 1015       Check-In   Supervising physician immediately available to respond to emergencies See telemetry face sheet for immediately available ER MD    Location [facility]-Cardiac & Pulmonary Rehab    Staff Present Hope Budds, RDN, LDN;Jessica Luan Pulling, MA, RCEP, CCRP, CCET;Kristen Coble, RN,BC,MSN;Thayer Embleton, RN, BSN, CCRP    Virtual Visit No    Medication changes reported     No    Fall or balance concerns reported    No    Warm-up and Cool-down Performed on first and last piece of equipment    Resistance Training Performed Yes    VAD Patient? No    PAD/SET Patient? No      Pain Assessment   Currently in Pain? No/denies                Social History   Tobacco Use  Smoking Status Former   Packs/day: 0.25   Years: 62.00   Pack years: 15.50   Types: Cigarettes   Quit date: 09/05/2018   Years since quitting: 2.1  Smokeless Tobacco Never    Goals Met:  Proper associated with RPD/PD & O2 Sat Independence with exercise equipment Exercise tolerated well No report of cardiac concerns or symptoms  Goals Unmet:  Not Applicable  Comments: Pt able to follow exercise prescription today without complaint.  Will continue to monitor for progression.    Dr. Emily Filbert is Medical Director for [place].  Dr. Ottie Glazier is Medical Director for [facility] Pulmonary Rehabilitation.

## 2020-11-05 ENCOUNTER — Encounter: Payer: No Typology Code available for payment source | Admitting: *Deleted

## 2020-11-05 ENCOUNTER — Other Ambulatory Visit: Payer: Self-pay

## 2020-11-05 DIAGNOSIS — C3490 Malignant neoplasm of unspecified part of unspecified bronchus or lung: Secondary | ICD-10-CM | POA: Diagnosis not present

## 2020-11-05 NOTE — Progress Notes (Signed)
Daily Session Note  Patient Details  Name: Levi [name] MRN: [id number] Date of Birth: [birth year] Referring Provider:   Flowsheet Row Pulmonary Rehab from 08/31/2020 in [facility] Cardiac and Pulmonary Rehab  Referring Provider [name] MD       Encounter Date: 11/05/2020  Check In:  Session Check In - 11/05/20 1044       Check-In   Supervising physician immediately available to respond to emergencies See telemetry face sheet for immediately available ER MD    Location [facility]-Cardiac & Pulmonary Rehab    Staff Present Birdie Sons, MPA, RN;Melissa [place], RDN, LDN;Jessica Luan Pulling, MA, RCEP, CCRP, CCET;Marigold Mom, RN, BSN, CCRP    Virtual Visit No    Medication changes reported     No    Fall or balance concerns reported    No    Warm-up and Cool-down Performed on first and last piece of equipment    Resistance Training Performed Yes    VAD Patient? No    PAD/SET Patient? No      Pain Assessment   Currently in Pain? No/denies                Social History   Tobacco Use  Smoking Status Former   Packs/day: 0.25   Years: 62.00   Pack years: 15.50   Types: Cigarettes   Quit date: 09/05/2018   Years since quitting: 2.1  Smokeless Tobacco Never    Goals Met:  Proper associated with RPD/PD & O2 Sat Independence with exercise equipment Exercise tolerated well No report of cardiac concerns or symptoms  Goals Unmet:  Not Applicable  Comments: Pt able to follow exercise prescription today without complaint.  Will continue to monitor for progression.    Dr. Emily Filbert is Medical Director for [place].  Dr. Ottie Glazier is Medical Director for [facility] Pulmonary Rehabilitation.

## 2020-11-10 ENCOUNTER — Other Ambulatory Visit: Payer: Self-pay

## 2020-11-10 DIAGNOSIS — C3490 Malignant neoplasm of unspecified part of unspecified bronchus or lung: Secondary | ICD-10-CM | POA: Diagnosis not present

## 2020-11-10 NOTE — Progress Notes (Signed)
Daily Session Note  Patient Details  Name: Levi [name] MRN: [id number] Date of Birth: 04-01-1941 Referring Provider:   Flowsheet Row Pulmonary Rehab from 08/31/2020 in [facility] Cardiac and Pulmonary Rehab  Referring Provider [name] MD       Encounter Date: 11/10/2020  Check In:  Session Check In - 11/10/20 0938       Check-In   Supervising physician immediately available to respond to emergencies See telemetry face sheet for immediately available ER MD    Location [facility]-Cardiac & Pulmonary Rehab    Staff Present Birdie Sons, MPA, RN;Jessica [place]uddy, MA, RCEP, CCRP, CCET;Melissa [place], RDN, LDN    Virtual Visit No    Medication changes reported     No    Fall or balance concerns reported    No    Tobacco Cessation No Change    Warm-up and Cool-down Performed on first and last piece of equipment    Resistance Training Performed Yes    VAD Patient? No    PAD/SET Patient? No      Pain Assessment   Currently in Pain? No/denies                Social History   Tobacco Use  Smoking Status Former   Packs/day: 0.25   Years: 62.00   Pack years: 15.50   Types: Cigarettes   Quit date: 09/05/2018   Years since quitting: 2.1  Smokeless Tobacco Never    Goals Met:  Independence with exercise equipment Exercise tolerated well No report of cardiac concerns or symptoms Strength training completed today  Goals Unmet:  Not Applicable  Comments: Pt able to follow exercise prescription today without complaint.  Will continue to monitor for progression.    Dr. Emily Filbert is Medical Director for [place].  Dr. Ottie Glazier is Medical Director for [facility] Pulmonary Rehabilitation.

## 2020-11-11 ENCOUNTER — Encounter: Payer: Self-pay | Admitting: *Deleted

## 2020-11-11 DIAGNOSIS — C3490 Malignant neoplasm of unspecified part of unspecified bronchus or lung: Secondary | ICD-10-CM

## 2020-11-11 NOTE — Progress Notes (Signed)
Pulmonary Individual Treatment Plan  Patient Details  Name: Levi [name] MRN: [id number] Date of Birth: 28-Sep-1940 Referring Provider:   Flowsheet Row Pulmonary Rehab from 08/31/2020 in [facility] Cardiac and Pulmonary Rehab  Referring Provider [name] MD       Initial Encounter Date:  Flowsheet Row Pulmonary Rehab from 08/31/2020 in [facility] Cardiac and Pulmonary Rehab  Date 08/31/20       Visit Diagnosis: Malignant neoplasm of lung, unspecified laterality, unspecified part of lung ([place])  Patient's Home Medications on Admission:  Current Outpatient Medications:    albuterol (PROVENTIL HFA;VENTOLIN HFA) 108 (90 Base) MCG/ACT inhaler, Inhale 2 puffs into the lungs every 6 (six) hours as needed for wheezing or shortness of breath., Disp: 1 Inhaler, Rfl: 0   apixaban (ELIQUIS) 5 MG TABS tablet, Take 1 tablet (5 mg total) by mouth 2 (two) times daily., Disp: 180 tablet, Rfl: 3   budesonide-formoterol (SYMBICORT) 160-4.5 MCG/ACT inhaler, Inhale 2 puffs into the lungs 2 (two) times daily., Disp: , Rfl:    diltiazem (DILACOR XR) 120 MG 24 hr capsule, Take 120 mg by mouth daily., Disp: , Rfl:    levothyroxine (SYNTHROID) 137 MCG tablet, Take 137 mcg by mouth daily before breakfast., Disp: , Rfl:    tamsulosin (FLOMAX) 0.4 MG CAPS capsule, Take 0.4 mg by mouth daily. 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[place])    Prostate enlargement    Shortness of breath    Squamous cell carcinoma of lung, left ([facility])    Thyroid disease    Tobacco abuse     Tobacco  Use: Social History   Tobacco Use  Smoking Status Former   Packs/day: 0.25   Years: 62.00   Pack years: 15.50   Types: Cigarettes   Quit date: 09/05/2018   Years since quitting: 2.1  Smokeless Tobacco Never    Labs: Recent Review Scientist, physiological     Labs for ITP Cardiac and Pulmonary Rehab Latest Ref Rng & Units 10/15/2018   PHART 7.350 - 7.450 7.42   PCO2ART 32.0 - 48.0 mmHg 35   HCO3 20.0 - 28.0 mmol/L 22.7   ACIDBASEDEF 0.0 - 2.0 mmol/L 1.3   O2SAT % 93.1        Pulmonary Assessment Scores:  Pulmonary Assessment Scores     Row Name 08/31/20 1237         ADL UCSD   ADL Phase Entry     SOB Score total 53     Rest 0     Walk 2     Stairs 4     Bath 3     Dress 3     Shop 2           CAT Score     CAT Score 26           mMRC Score     mMRC Score 3             UCSD:  Self-administered rating of dyspnea associated with activities of daily living (ADLs) 6-point scale (0 = "not at all" to 5 = "maximal or unable to do because of breathlessness")  Scoring Scores range from 0 to 120.  Minimally important difference is 5 units  CAT: CAT can identify the health impairment of COPD patients and is better correlated with disease progression.  CAT has a scoring range of zero to 40. The CAT score is classified into four groups of low (less than 10), medium (10 - 20), high (21-30) and very high (31-40) based on the impact level of disease on health status. A CAT score over 10 suggests significant symptoms.  A worsening CAT score could be explained by an exacerbation, poor medication adherence, poor inhaler technique, or progression of COPD or comorbid conditions.  CAT MCID is 2 points  mMRC: mMRC (Modified Medical Research Council) Dyspnea Scale is used to assess the degree of baseline functional disability in patients of respiratory disease due to dyspnea. No minimal important difference is established. A decrease in score of 1 point or greater is considered a  positive change.   Pulmonary Function Assessment:   Exercise Target Goals: Exercise Program Goal: Individual exercise prescription set using results from initial 6 min walk test and THRR while considering  patient's activity barriers and safety.   Exercise Prescription Goal: Initial exercise prescription builds to 30-45 minutes a day of aerobic activity, 2-3 days per week.  Home exercise guidelines will be given to patient during program as part of exercise prescription that the participant will acknowledge.  Education: Aerobic Exercise: - Group verbal and visual presentation on the components of exercise prescription. Introduces F.I.T.T principle from ACSM for exercise prescriptions.  Reviews F.I.T.T. principles of aerobic exercise including progression. Written material given at graduation.   Education: Resistance Exercise: - Group verbal and visual presentation on the components of exercise prescription. Introduces F.I.T.T principle from ACSM for exercise prescriptions  Reviews F.I.T.T. principles of resistance exercise including progression. Written material given at graduation.    Education: Exercise & Equipment Safety: - Individual verbal instruction and demonstration of equipment use and safety with use of the equipment. Flowsheet Row Pulmonary Rehab from 10/08/2020 in [facility] Cardiac and Pulmonary Rehab  Education need identified 08/31/20  Date 08/31/20  Educator [place]  Instruction Review Code 1- Verbalizes Understanding       Education: Exercise Physiology & General Exercise Guidelines: - Group verbal and written instruction with models to review the exercise physiology of the cardiovascular system and associated critical values. Provides general exercise guidelines with specific guidelines to those with heart or lung disease.    Education: Flexibility, Balance, Mind/Body Relaxation: - Group verbal and visual presentation with interactive activity on the components of exercise  prescription. Introduces F.I.T.T principle from ACSM for exercise prescriptions. Reviews F.I.T.T. principles of flexibility and balance exercise training including progression. Also discusses the mind body connection.  Reviews various relaxation techniques to help reduce and manage stress (i.e. Deep breathing, progressive muscle relaxation, and visualization). Balance handout provided to take home. Written material given at graduation. Flowsheet Row Pulmonary Rehab from 10/08/2020 in [facility] Cardiac and Pulmonary Rehab  Date 09/03/20  Educator AS  Instruction Review Code 1- Verbalizes Understanding       Activity Barriers & Risk Stratification:  Activity Barriers & Cardiac Risk Stratification - 08/31/20 1234       Activity Barriers & Cardiac Risk Stratification   Activity Barriers Shortness of Breath;Deconditioning;Muscular Weakness;Other (comment)    Comments Sciatica  6 Minute Walk:  6 Minute Walk     Row Name 08/31/20 1240         6 Minute Walk   Phase Initial     Distance 780 feet     Walk Time 6 minutes     # of Rest Breaks 0     MPH 1.47     METS 1.08     RPE 13     Perceived Dyspnea  1     VO2 Peak 3.81     Symptoms Yes (comment)     Comments Lower back pain 7/10     Resting HR 82 bpm     Resting BP 132/78     Resting Oxygen Saturation  94 %     Exercise Oxygen Saturation  during 6 min walk 86 %     Max Ex. HR 101 bpm     Max Ex. BP 144/76     2 Minute Post BP 124/78           Interval HR     1 Minute HR 93     2 Minute HR 97     3 Minute HR 98     4 Minute HR 99     5 Minute HR 101     6 Minute HR 100     2 Minute Post HR 87     Interval Heart Rate? Yes           Interval Oxygen     Interval Oxygen? Yes     Baseline Oxygen Saturation % 94 %     1 Minute Oxygen Saturation % 90 %     1 Minute Liters of Oxygen 0 L  RA     2 Minute Oxygen Saturation % 87 %     2 Minute Liters of Oxygen 0 L     3 Minute Oxygen Saturation % 86 %     3  Minute Liters of Oxygen 0 L     4 Minute Oxygen Saturation % 87 %     4 Minute Liters of Oxygen 0 L     5 Minute Oxygen Saturation % 86 %     5 Minute Liters of Oxygen 0 L     6 Minute Oxygen Saturation % 88 %     6 Minute Liters of Oxygen 0 L     2 Minute Post Oxygen Saturation % 94 %     2 Minute Post Liters of Oxygen 0 L            Oxygen Initial Assessment:  Oxygen Initial Assessment - 08/31/20 1237       Home Oxygen   Home Oxygen Device None    Sleep Oxygen Prescription None    Home Exercise Oxygen Prescription None    Home Resting Oxygen Prescription None      Initial 6 min Walk   Oxygen Used None      Program Oxygen Prescription   Program Oxygen Prescription None      Intervention   Short Term Goals To learn and understand importance of monitoring SPO2 with pulse oximeter and demonstrate accurate use of the pulse oximeter.;To learn and understand importance of maintaining oxygen saturations>88%;To learn and demonstrate proper pursed lip breathing techniques or other breathing techniques. ;To learn and demonstrate proper use of respiratory medications    Long  Term Goals Verbalizes importance of monitoring SPO2 with pulse oximeter and return demonstration;Maintenance of O2 saturations>88%;Exhibits proper  breathing techniques, such as pursed lip breathing or other method taught during program session;Compliance with respiratory medication;Demonstrates proper use of MDI's             Oxygen Re-Evaluation:  Oxygen Re-Evaluation     Row Name 09/03/20 0950 11/05/20 0932           Program Oxygen Prescription   Program Oxygen Prescription None None             Home Oxygen      Home Oxygen Device None None      Sleep Oxygen Prescription None None      Home Exercise Oxygen Prescription None None      Home Resting Oxygen Prescription None None             Goals/Expected Outcomes      Short Term Goals To learn and understand importance of monitoring SPO2 with  pulse oximeter and demonstrate accurate use of the pulse oximeter.;To learn and understand importance of maintaining oxygen saturations>88%;To learn and demonstrate proper pursed lip breathing techniques or other breathing techniques.  To learn and understand importance of monitoring SPO2 with pulse oximeter and demonstrate accurate use of the pulse oximeter.;To learn and understand importance of maintaining oxygen saturations>88%;To learn and demonstrate proper pursed lip breathing techniques or other breathing techniques.       Long  Term Goals Verbalizes importance of monitoring SPO2 with pulse oximeter and return demonstration;Maintenance of O2 saturations>88%;Exhibits proper breathing techniques, such as pursed lip breathing or other method taught during program session;Exhibits compliance with exercise, home  and travel O2 prescription Verbalizes importance of monitoring SPO2 with pulse oximeter and return demonstration;Maintenance of O2 saturations>88%;Exhibits proper breathing techniques, such as pursed lip breathing or other method taught during program session;Exhibits compliance with exercise, home  and travel O2 prescription      Comments Reviewed PLB technique with pt.  Talked about how it works and it's importance in maintaining their exercise saturations. He continues to practice PLB and he notices his O2 will come back up. When exercising his O2 is about 89-93 at home. He feels that his breathing has improved overall and now he can do more things at home (ADLs). He still has good and bad days.      Goals/Expected Outcomes Short: Become more profiecient at using PLB.   Long: Become independent at using PLB. Short: Become more profiecient at using PLB, continue to exercise at home  Long: Become independent at using PLB.              Oxygen Discharge (Final Oxygen Re-Evaluation):  Oxygen Re-Evaluation - 11/05/20 0932       Program Oxygen Prescription   Program Oxygen Prescription None       Home Oxygen   Home Oxygen Device None    Sleep Oxygen Prescription None    Home Exercise Oxygen Prescription None    Home Resting Oxygen Prescription None      Goals/Expected Outcomes   Short Term Goals To learn and understand importance of monitoring SPO2 with pulse oximeter and demonstrate accurate use of the pulse oximeter.;To learn and understand importance of maintaining oxygen saturations>88%;To learn and demonstrate proper pursed lip breathing techniques or other breathing techniques.     Long  Term Goals Verbalizes importance of monitoring SPO2 with pulse oximeter and return demonstration;Maintenance of O2 saturations>88%;Exhibits proper breathing techniques, such as pursed lip breathing or other method taught during program session;Exhibits compliance with exercise, home  and travel O2 prescription  Comments He continues to practice PLB and he notices his O2 will come back up. When exercising his O2 is about 89-93 at home. He feels that his breathing has improved overall and now he can do more things at home (ADLs). He still has good and bad days.    Goals/Expected Outcomes Short: Become more profiecient at using PLB, continue to exercise at home  Long: Become independent at using PLB.             Initial Exercise Prescription:  Initial Exercise Prescription - 08/31/20 1200       Date of Initial Exercise RX and Referring Provider   Date 08/31/20    Referring Provider [name] MD      Recumbant Bike   Level 1    RPM 60    Watts 10    Minutes 15    METs 1      NuStep   Level 1    SPM 80    Minutes 15    METs 1      Track   Laps 10    Minutes 15    METs 1      Prescription Details   Frequency (times per week) 2    Duration Progress to 30 minutes of continuous aerobic without signs/symptoms of physical distress      Intensity   THRR 40-80% of Max Heartrate 106-130    Ratings of Perceived Exertion 11-13    Perceived Dyspnea 0-4      Progression    Progression Continue to progress workloads to maintain intensity without signs/symptoms of physical distress.      Resistance Training   Training Prescription Yes    Weight 3 lb    Reps 10-15             Perform Capillary Blood Glucose checks as needed.  Exercise Prescription Changes:   Exercise Prescription Changes     Row Name 08/31/20 1200 09/08/20 1300 09/23/20 1400 10/06/20 1400 10/20/20 1500     Response to Exercise   Blood Pressure (Admit) 132/78 128/82 128/76 146/80 126/76   Blood Pressure (Exercise) 144/76 146/64 142/78 130/76 --   Blood Pressure (Exit) 124/78 144/74 128/76 116/70 126/74   Heart Rate (Admit) 82 bpm 88 bpm 76 bpm 87 bpm 73 bpm   Heart Rate (Exercise) 101 bpm 97 bpm 95 bpm 96 bpm 99 bpm   Heart Rate (Exit) 87 bpm 88 bpm 86 bpm 94 bpm 92 bpm   Oxygen Saturation (Admit) 94 % 90 % 92 % 90 % 93 %   Oxygen Saturation (Exercise) 86 % 89 % 89 % 91 % 91 %   Oxygen Saturation (Exit) 94 % 92 % 93 % 93 % 92 %   Rating of Perceived Exertion (Exercise) _0 Perceived Dyspnea (Exercise) _1 Symptoms Back pain 7/10, SOB none none none SOB   Comments walk test results -- -- -- --   Duration -- Progress to 30 minutes of  aerobic without signs/symptoms of physical distress Continue with 30 min of aerobic exercise without signs/symptoms of physical distress. Continue with 30 min of aerobic exercise without signs/symptoms of physical distress. Continue with 30 min of aerobic exercise without signs/symptoms of physical distress.   Intensity -- THRR unchanged THRR unchanged THRR unchanged THRR unchanged     Progression   Progression -- Continue to progress workloads to maintain intensity without signs/symptoms of physical distress. Continue  to progress workloads to maintain intensity without signs/symptoms of physical distress. Continue to progress workloads to maintain intensity without signs/symptoms of physical distress. Continue to progress workloads  to maintain intensity without signs/symptoms of physical distress.   Average METs -- 2 2.15 2.5 2.15     Resistance Training   Training Prescription -- Yes Yes Yes Yes   Weight -- 3 lb 3 lb 5 lb 5 lb   Reps -- 10-15 10-15 10-15 10-15     Interval Training   Interval Training -- -- No No No     NuStep   Level -- -- _0 Minutes -- -- _1 METs -- -- 2.3 2.5 2.2     Biostep-RELP   Level -- 2 -- -- --   Minutes -- 15 -- -- --   METs -- 2 -- -- --     Track   Laps -- _2 Minutes -- _3 METs -- -- 2 -- 2.1     Home Exercise Plan   Plans to continue exercise at -- -- -- -- [employer] (comment)  [employer]   Frequency -- -- -- -- Add 2 additional days to program exercise sessions.   Initial Home Exercises Provided -- -- -- -- 10/08/20    Row Name 11/05/20 0900             Response to Exercise   Blood Pressure (Admit) 142/82       Blood Pressure (Exit) 134/74       Heart Rate (Admit) 73 bpm       Heart Rate (Exercise) 93 bpm       Heart Rate (Exit) 78 bpm       Oxygen Saturation (Admit) 91 %       Oxygen Saturation (Exercise) 88 %       Oxygen Saturation (Exit) 96 %       Rating of Perceived Exertion (Exercise) 13       Perceived Dyspnea (Exercise) 2       Symptoms SOB       Duration Continue with 30 min of aerobic exercise without signs/symptoms of physical distress.       Intensity THRR unchanged               Progression     Progression Continue to progress workloads to maintain intensity without signs/symptoms of physical distress.       Average METs 2.05               Resistance Training     Training Prescription Yes       Weight 5 lb       Reps 10-15               Interval Training     Interval Training No               NuStep     Level 4       Minutes 15       METs 2.1               Biostep-RELP     Level 4       Minutes 15       METs 2.2               Track     Laps 20       Minutes 2.14  METs 1.98                Home Exercise Plan     Plans to continue exercise at [facility] (comment)  [employer]       Frequency Add 2 additional days to program exercise sessions.       Initial Home Exercises Provided 10/08/20               Exercise Comments:   Exercise Comments     Row Name 09/03/20 0949           Exercise Comments First full day of exercise!  Patient was oriented to gym and equipment including functions, settings, policies, and procedures.  Patient's individual exercise prescription and treatment plan were reviewed.  All starting workloads were established based on the results of the 6 minute walk test done at initial orientation visit.  The plan for exercise progression was also introduced and progression will be customized based on patient's performance and goals.                Exercise Goals and Review:   Exercise Goals     Row Name 08/31/20 1254             Exercise Goals   Increase Physical Activity Yes       Intervention Provide advice, education, support and counseling about physical activity/exercise needs.;Develop an individualized exercise prescription for aerobic and resistive training based on initial evaluation findings, risk stratification, comorbidities and participant's personal goals.       Expected Outcomes Short Term: Attend rehab on a regular basis to increase amount of physical activity.;Long Term: Add in home exercise to make exercise part of routine and to increase amount of physical activity.;Long Term: Exercising regularly at least 3-5 days a week.       Increase Strength and Stamina Yes       Intervention Provide advice, education, support and counseling about physical activity/exercise needs.;Develop an individualized exercise prescription for aerobic and resistive training based on initial evaluation findings, risk stratification, comorbidities and participant's personal goals.       Expected Outcomes Short Term: Increase workloads  from initial exercise prescription for resistance, speed, and METs.;Short Term: Perform resistance training exercises routinely during rehab and add in resistance training at home;Long Term: Improve cardiorespiratory fitness, muscular endurance and strength as measured by increased METs and functional capacity (6MWT)       Able to understand and use rate of perceived exertion (RPE) scale Yes       Intervention Provide education and explanation on how to use RPE scale       Expected Outcomes Short Term: Able to use RPE daily in rehab to express subjective intensity level;Long Term:  Able to use RPE to guide intensity level when exercising independently       Able to understand and use Dyspnea scale Yes       Intervention Provide education and explanation on how to use Dyspnea scale       Expected Outcomes Short Term: Able to use Dyspnea scale daily in rehab to express subjective sense of shortness of breath during exertion;Long Term: Able to use Dyspnea scale to guide intensity level when exercising independently       Knowledge and understanding of Target Heart Rate Range (THRR) Yes       Intervention Provide education and explanation of THRR including how the numbers were predicted and where they are located for reference  Expected Outcomes Short Term: Able to state/look up THRR;Short Term: Able to use daily as guideline for intensity in rehab;Long Term: Able to use THRR to govern intensity when exercising independently       Able to check pulse independently Yes       Intervention Provide education and demonstration on how to check pulse in carotid and radial arteries.;Review the importance of being able to check your own pulse for safety during independent exercise       Expected Outcomes Short Term: Able to explain why pulse checking is important during independent exercise;Long Term: Able to check pulse independently and accurately       Understanding of Exercise Prescription Yes        Intervention Provide education, explanation, and written materials on patient's individual exercise prescription       Expected Outcomes Short Term: Able to explain program exercise prescription;Long Term: Able to explain home exercise prescription to exercise independently                Exercise Goals Re-Evaluation :  Exercise Goals Re-Evaluation     Row Name 09/03/20 0949 09/23/20 1454 10/06/20 1424 10/08/20 0944 10/20/20 1547     Exercise Goal Re-Evaluation   Exercise Goals Review Increase Physical Activity;Able to understand and use rate of perceived exertion (RPE) scale;Knowledge and understanding of Target Heart Rate Range (THRR);Understanding of Exercise Prescription;Increase Strength and Stamina;Able to understand and use Dyspnea scale;Able to check pulse independently Increase Physical Activity;Increase Strength and Stamina;Understanding of Exercise Prescription Increase Physical Activity;Increase Strength and Stamina Increase Physical Activity;Increase Strength and Stamina Increase Physical Activity;Increase Strength and Stamina;Understanding of Exercise Prescription   Comments Reviewed RPE and dyspnea scales, THR and program prescription with pt today.  Pt voiced understanding and was given a copy of goals to take home. Levi [name] is off to a good start in rehab.  He is up to 19 laps on the track. He did drop back to level 1 on the NuStep and we will try to get him back up to level 3.  We will continue to monitor his progress. Levi [name] has progressed to level 4 on NS.  He has also increased to 5 lb for strength work.  We will monitor progress. Reviewed home exercise with pt today.  [facility]  Reviewed THR, pulse, RPE, sign and symptoms, pulse oximetery and when to call 911 or MD.  Also discussed weather considerations and indoor options.  Pt voiced understanding. Levi [name] has been doing well in rehab.  He is up to level 4 on the NuStep.  We will continue to  monitor his progress.   Expected Outcomes Short: Use RPE daily to regulate intensity. Long: Follow program prescription in THR. Short: Continue to attend rehab regularly Long: Conitnue to follow program prescription Short: reach 20 laps on track Long:  continue to build stamina Short:  work up to walking 30 min at home Long:  continue to build stamina Short: Continue to build up stamina on the track [place] to improve stamina    Row Name 11/05/20 0908 11/05/20 0929           Exercise Goal Re-Evaluation   Exercise Goals Review Increase Physical Activity;Increase Strength and Stamina;Understanding of Exercise Prescription Increase Physical Activity;Increase Strength and Stamina;Understanding of Exercise Prescription      Comments Levi [name] has been progressing. O2 sats are maintaining above 88% and has increased his level on the Biostep to level 4. Will  continue to monitor. Levi [name] reports not doing much exercise at home right now. [facility] and he is walking (30-40 minutes/day - RPE 11) at home as well as some squats. He reports his O2 is 89-93 at home when exercising.      Expected Outcomes Short: Continue to increase number of laps on track Long: Continue to increase overall MET level ST: continue to attend rehab, join the Y LT: continue to increase MET level               Discharge Exercise Prescription (Final Exercise Prescription Changes):  Exercise Prescription Changes - 11/05/20 0900       Response to Exercise   Blood Pressure (Admit) 142/82    Blood Pressure (Exit) 134/74    Heart Rate (Admit) 73 bpm    Heart Rate (Exercise) 93 bpm    Heart Rate (Exit) 78 bpm    Oxygen Saturation (Admit) 91 %    Oxygen Saturation (Exercise) 88 %    Oxygen Saturation (Exit) 96 %    Rating of Perceived Exertion (Exercise) 13    Perceived Dyspnea (Exercise) 2    Symptoms SOB    Duration Continue with 30 min of aerobic exercise without signs/symptoms of physical  distress.    Intensity THRR unchanged      Progression   Progression Continue to progress workloads to maintain intensity without signs/symptoms of physical distress.    Average METs 2.05      Resistance Training   Training Prescription Yes    Weight 5 lb    Reps 10-15      Interval Training   Interval Training No      NuStep   Level 4    Minutes 15    METs 2.1      Biostep-RELP   Level 4    Minutes 15    METs 2.2      Track   Laps 20    Minutes 2.14    METs 1.98      Home Exercise Plan   Plans to continue exercise at [employer] (comment)   [facility]   Frequency Add 2 additional days to program exercise sessions.    Initial Home Exercises Provided 10/08/20             Nutrition:  Target Goals: Understanding of nutrition guidelines, daily intake of sodium <1511m, cholesterol <2058m calories 30% from fat and 7% or less from saturated fats, daily to have 5 or more servings of fruits and vegetables.  Education: All About Nutrition: -Group instruction provided by verbal, written material, interactive activities, discussions, models, and posters to present general guidelines for heart healthy nutrition including fat, fiber, MyPlate, the role of sodium in heart healthy nutrition, utilization of the nutrition label, and utilization of this knowledge for meal planning. Follow up email sent as well. Written material given at graduation. Flow[facility]  Date 09/10/20  [facility] Review Code 1- Verbalizes Understanding       Biometrics:  Pre Biometrics - 08/31/20 1233       Pre Biometrics   Height 5' 10.75" (1.797 m)    Weight 271 lb 9.6 oz (123.2 kg)    BMI (Calculated) 38.15    Single Leg Stand 8.16 seconds              Nutrition Therapy Plan and Nutrition Goals:  Nutrition Therapy & Goals -  09/17/20 1146       Nutrition Therapy   RD appointment deferred Yes   Levi [name]  does not want to meet with the RD at this time - will continue to check in.            Nutrition Assessments:  MEDIFICTS Score Key: ?70 Need to make dietary changes  40-70 Heart Healthy Diet ? 40 Therapeutic Level Cholesterol Diet  Flowsheet Row Pulmonary Rehab from 08/31/2020 in [facility] Cardiac and Pulmonary Rehab  Picture Your Plate Total Score on Admission 54      Picture Your Plate Scores: <13 Unhealthy dietary pattern with much room for improvement. 41-50 Dietary pattern unlikely to meet recommendations for good health and room for improvement. 51-60 More healthful dietary pattern, with some room for improvement.  >60 Healthy dietary pattern, although there may be some specific behaviors that could be improved.   Nutrition Goals Re-Evaluation:  Nutrition Goals Re-Evaluation     [place] Name 10/08/20 0933 11/05/20 0936           Goals   Comment Derl has not met with RD yet He reports doing well with his nutrition and his wife keeps him on track. Continues to defer nutrition consult.               Nutrition Goals Discharge (Final Nutrition Goals Re-Evaluation):  Nutrition Goals Re-Evaluation - 11/05/20 0936       Goals   Comment He reports doing well with his nutrition and his wife keeps him on track. Continues to defer nutrition consult.             Psychosocial: Target Goals: Acknowledge presence or absence of significant depression and/or stress, maximize coping skills, provide positive support system. Participant is able to verbalize types and ability to use techniques and skills needed for reducing stress and depression.   Education: Stress, Anxiety, and Depression - Group verbal and visual presentation to define topics covered.  Reviews how body is impacted by stress, anxiety, and depression.  Also discusses healthy ways to reduce stress and to treat/manage anxiety and depression.  Written material given at graduation. Flowsheet Row Pulmonary Rehab from  10/08/2020 in [facility] Cardiac and Pulmonary Rehab  Date 10/08/20  Educator [facility]  Instruction Review Code 1- [employer] Understanding       Education: Sleep Hygiene -Provides group verbal and written instruction about how sleep can affect your health.  Define sleep hygiene, discuss sleep cycles and impact of sleep habits. Review good sleep hygiene tips.    Initial Review & Psychosocial Screening:  Initial Psych Review & Screening - 08/28/20 1310       Initial Review   Current issues with Current Sleep Concerns;Current Stress Concerns   sleeps on and off all day; sleeps in bed from 3am-10am.   Source of Stress Concerns Unable to participate in former interests or hobbies;Unable to perform yard/household activities      [place]? Yes   wife   Concerns Inappropriate over/under dependence on family/friends      Barriers   Psychosocial barriers to participate in program There are no identifiable barriers or psychosocial needs.;The patient should benefit from training in stress management and relaxation.      Screening Interventions   Interventions Encouraged to exercise;Provide feedback about the scores to participant;To provide support and resources with identified psychosocial needs    Expected Outcomes Short Term goal: Utilizing psychosocial counselor, staff and physician to assist with identification of specific Stressors  or current issues interfering with healing process. Setting desired goal for each stressor or current issue identified.;Long Term Goal: Stressors or current issues are controlled or eliminated.;Short Term goal: Identification and review with participant of any Quality of Life or Depression concerns found by scoring the questionnaire.;Long Term goal: The participant improves quality of Life and PHQ9 Scores as seen by post scores and/or verbalization of changes             Quality of Life Scores:  Scores of 19 and below usually indicate a  poorer quality of life in these areas.  A difference of  2-3 points is a clinically meaningful difference.  A difference of 2-3 points in the total score of the Quality of Life Index has been associated with significant improvement in overall quality of life, self-image, physical symptoms, and general health in studies assessing change in quality of life.  PHQ-9: Recent Review Flowsheet Data     Depression screen [facility] 2/9 08/31/2020 10/20/2015 10/16/2015 01/07/2015 12/19/2014   Decreased Interest 1 0 0 0 0   Down, Depressed, Hopeless 0 0 0 0 0   PHQ - 2 Score 1 0 0 0 0   Altered sleeping 0 - - - -   Tired, decreased energy 1 - - - -   Change in appetite 0 - - - -   Feeling bad or failure about yourself  0 - - - -   Trouble concentrating 0 - - - -   Moving slowly or fidgety/restless 0 - - - -   Suicidal thoughts 0 - - - -   PHQ-9 Score 2 - - - -   Difficult doing work/chores Not difficult at all - - - -      Interpretation of Total Score  Total Score Depression Severity:  1-4 = Minimal depression, 5-9 = Mild depression, 10-14 = Moderate depression, 15-19 = Moderately severe depression, 20-27 = Severe depression   Psychosocial Evaluation and Intervention:  Psychosocial Evaluation - 10/08/20 0937       Psychosocial Evaluation & Interventions   Comments Levi [name] is getting up and around more.  He feels like he can start mowing some on a riding mower.  We discussed heat concerns and not starting with too much time mowing etc.  He goes to sleep between 12 - 2 am and sleeps until 9 or 10 am.  He feels rested.    Expected Outcomes Short:  continue to exercise to build stamina Long: maintain good sleep habits             Psychosocial Re-Evaluation:  Psychosocial Re-Evaluation     [place] Name 11/05/20 [phone]             Psychosocial Re-Evaluation   Current issues with None Identified       Comments He reports no stress at this time. He reports sleeping well when he goes to sleep, but will  usually go to bed at 1am, he will wake up 7-9am (he feels well rested). He reports not having any hobbies that help him since breathing problems due to lack of energy - he used to fish and ran a ballfield. He leans on his wife for support.       Expected Outcomes ST: continue to attend rehab and notify staff of any changes LT: maintain positive attitude       Interventions Encouraged to attend Pulmonary Rehabilitation for the exercise       Continue Psychosocial Services  Follow up  required by staff                Psychosocial Discharge (Final Psychosocial Re-Evaluation):  Psychosocial Re-Evaluation - 11/05/20 3212       Psychosocial Re-Evaluation   Current issues with None Identified    Comments He reports no stress at this time. He reports sleeping well when he goes to sleep, but will usually go to bed at 1am, he will wake up 7-9am (he feels well rested). He reports not having any hobbies that help him since breathing problems due to lack of energy - he used to fish and ran a ballfield. He leans on his wife for support.    Expected Outcomes ST: continue to attend rehab and notify staff of any changes LT: maintain positive attitude    Interventions Encouraged to attend Pulmonary Rehabilitation for the exercise    Continue Psychosocial Services  Follow up required by staff             Education: Education Goals: Education classes will be provided on a weekly basis, covering required topics. Participant will state understanding/return demonstration of topics presented.  Learning Barriers/Preferences:  Learning Barriers/Preferences - 08/28/20 1305       Learning Barriers/Preferences   Learning Barriers Hearing    Learning Preferences None             General Pulmonary Education Topics:  Infection Prevention: - Provides verbal and written material to individual with discussion of infection control including proper hand washing and proper equipment cleaning during exercise  session. Flowsheet Row Pulmonary Rehab from 10/08/2020 in [facility] Cardiac and Pulmonary Rehab  Education need identified 08/31/20  Date 08/31/20  Educator [place]  Instruction Review Code 1- Verbalizes Understanding       Falls Prevention: - Provides verbal and written material to individual with discussion of falls prevention and safety. Flowsheet Row Pulmonary Rehab from 10/08/2020 in [facility] Cardiac and Pulmonary Rehab  Education need identified 08/31/20  Date 08/31/20  Educator [place]  Instruction Review Code 1- Verbalizes Understanding       Chronic Lung Disease Review: - Group verbal instruction with posters, models, PowerPoint presentations and videos,  to review new updates, new respiratory medications, new advancements in procedures and treatments. Providing information on websites and "800" numbers for continued self-education. Includes information about supplement oxygen, available portable oxygen systems, continuous and intermittent flow rates, oxygen safety, concentrators, and Medicare reimbursement for oxygen. Explanation of Pulmonary Drugs, including class, frequency, complications, importance of spacers, rinsing mouth after steroid MDI's, and proper cleaning methods for nebulizers. Review of basic lung anatomy and physiology related to function, structure, and complications of lung disease. Review of risk factors. Discussion about methods for diagnosing sleep apnea and types of masks and machines for OSA. Includes a review of the use of types of environmental controls: home humidity, furnaces, filters, dust mite/pet prevention, HEPA vacuums. Discussion about weather changes, air quality and the benefits of nasal washing. Instruction on Warning signs, infection symptoms, calling MD promptly, preventive modes, and value of vaccinations. Review of effective airway clearance, coughing and/or vibration techniques. Emphasizing that all should Create an Action Plan. Written material given at  graduation. Flowsheet Row Pulmonary Rehab from 10/08/2020 in [facility] Cardiac and Pulmonary Rehab  Date 10/01/20  Educator [facility]  Instruction Review Code 1- Verbalizes Understanding       AED/CPR: - Group verbal and written instruction with the use of models to demonstrate the basic use of the AED with the basic ABC's of resuscitation.  Anatomy and Cardiac Procedures: - Group verbal and visual presentation and models provide information about basic cardiac anatomy and function. Reviews the testing methods done to diagnose heart disease and the outcomes of the test results. Describes the treatment choices: Medical Management, Angioplasty, or Coronary Bypass Surgery for treating various heart conditions including Myocardial Infarction, Angina, Valve Disease, and Cardiac Arrhythmias.  Written material given at graduation.   Medication Safety: - Group verbal and visual instruction to review commonly prescribed medications for heart and lung disease. Reviews the medication, class of the drug, and side effects. Includes the steps to properly store meds and maintain the prescription regimen.  Written material given at graduation. Flowsheet Row Pulmonary Rehab from 10/08/2020 in [facility] Cardiac and Pulmonary Rehab  Date 09/17/20  Educator [place]  Instruction Review Code 1- Verbalizes Understanding       Other: -Provides group and verbal instruction on various topics (see comments)   Knowledge Questionnaire Score:  Knowledge Questionnaire Score - 08/31/20 1235       Knowledge Questionnaire Score   Pre Score 16/18: Oxygen, Lung disease              Core Components/Risk Factors/Patient Goals at Admission:  Personal Goals and Risk Factors at Admission - 08/31/20 1255       Core Components/Risk Factors/Patient Goals on Admission    Weight Management Yes;Weight Loss    Intervention Weight Management: Develop a combined nutrition and exercise program designed to reach desired caloric intake,  while maintaining appropriate intake of nutrient and fiber, sodium and fats, and appropriate energy expenditure required for the weight goal.;Weight Management: Provide education and appropriate resources to help participant work on and attain dietary goals.;Weight Management/Obesity: Establish reasonable short term and long term weight goals.    Admit Weight 271 lb (122.9 kg)    Goal Weight: Short Term 265 lb (120.2 kg)    Goal Weight: Long Term 250 lb (113.4 kg)    Expected Outcomes Short Term: Continue to assess and modify interventions until short term weight is achieved;Long Term: Adherence to nutrition and physical activity/exercise program aimed toward attainment of established weight goal;Weight Loss: Understanding of general recommendations for a balanced deficit meal plan, which promotes 1-2 lb weight loss per week and includes a negative energy balance of [phone] kcal/d;Understanding recommendations for meals to include 15-35% energy as protein, 25-35% energy from fat, 35-60% energy from carbohydrates, less than 235m of dietary cholesterol, 20-35 gm of total fiber daily;Understanding of distribution of calorie intake throughout the day with the consumption of 4-5 meals/snacks    Improve shortness of breath with ADL's Yes    Intervention Provide education, individualized exercise plan and daily activity instruction to help decrease symptoms of SOB with activities of daily living.    Expected Outcomes Short Term: Improve cardiorespiratory fitness to achieve a reduction of symptoms when performing ADLs;Long Term: Be able to perform more ADLs without symptoms or delay the onset of symptoms    Hypertension Yes    Intervention Provide education on lifestyle modifcations including regular physical activity/exercise, weight management, moderate sodium restriction and increased consumption of fresh fruit, vegetables, and low fat dairy, alcohol moderation, and smoking cessation.;Monitor prescription use  compliance.    Expected Outcomes Short Term: Continued assessment and intervention until BP is < 140/957mHG in hypertensive participants. < 130/8077mG in hypertensive participants with diabetes, heart failure or chronic kidney disease.;Long Term: Maintenance of blood pressure at goal levels.  Education:Diabetes - Individual verbal and written instruction to review signs/symptoms of diabetes, desired ranges of glucose level fasting, after meals and with exercise. Acknowledge that pre and post exercise glucose checks will be done for 3 sessions at entry of program.   Know Your Numbers and Heart Failure: - Group verbal and visual instruction to discuss disease risk factors for cardiac and pulmonary disease and treatment options.  Reviews associated critical values for Overweight/Obesity, Hypertension, Cholesterol, and Diabetes.  Discusses basics of heart failure: signs/symptoms and treatments.  Introduces Heart Failure Zone chart for action plan for heart failure.  Written material given at graduation. Flowsheet Row Pulmonary Rehab from 10/08/2020 in [facility] Cardiac and Pulmonary Rehab  Date 09/24/20  Educator [place]  Instruction Review Code 1- Verbalizes Understanding       Core Components/Risk Factors/Patient Goals Review:   Goals and Risk Factor Review     Row Name 10/08/20 5038 11/05/20 0934           Core Components/Risk Factors/Patient Goals Review   Personal Goals Review Weight Management/Obesity;Improve shortness of breath with ADL's Weight Management/Obesity;Improve shortness of breath with ADL's      Review Levi [name] weight is down 3-4 lb since he started.  He is walking at home as well.  He feels like he can get out and mow the grass some now. (riding mower) He continues to practice PLB and he notices his O2 will come back up. When exercising his O2 is about 89-93 at home. He feels that his breathing has improved overall and now he can do more things at home (ADLs). He  still has good and bad days. He reports his weight has been stable. He is taking all his medications as directed and tolerating them well.      Expected Outcomes Short: continue to exercise consistently and monitor weight Long:  reach goal weight Short: continue to exercise consistently and monitor weight Long: reach goal weight               Core Components/Risk Factors/Patient Goals at Discharge (Final Review):   Goals and Risk Factor Review - 11/05/20 0934       Core Components/Risk Factors/Patient Goals Review   Personal Goals Review Weight Management/Obesity;Improve shortness of breath with ADL's    Review He continues to practice PLB and he notices his O2 will come back up. When exercising his O2 is about 89-93 at home. He feels that his breathing has improved overall and now he can do more things at home (ADLs). He still has good and bad days. He reports his weight has been stable. He is taking all his medications as directed and tolerating them well.    Expected Outcomes Short: continue to exercise consistently and monitor weight Long: reach goal weight             ITP Comments:  ITP Comments     Row Name 08/28/20 1320 08/31/20 1238 09/03/20 0938 09/16/20 0708 10/14/20 0805   ITP Comments Initial telephone orientation completed. Diagnosis can be found in [facility] 3/17. EP orientation scheduled for 5/2 at 10:30am. Completed 6MWT and gym orientation. Initial ITP created and sent for review to Dr. Emily Filbert, Medical Director. First full day of exercise!  Patient was oriented to gym and equipment including functions, settings, policies, and procedures.  Patient's individual exercise prescription and treatment plan were reviewed.  All starting workloads were established based on the results of the 6 minute walk test done at initial orientation visit.  The  plan for exercise progression was also introduced and progression will be customized based on patient's performance and goals. 30 Day  review completed. Medical Director ITP review done, changes made as directed, and signed approval by Medical Director.  New to program 30 Day review completed. Medical Director ITP review done, changes made as directed, and signed approval by Medical Director.    [place] Name 11/11/20 0847           ITP Comments 30 Day review completed. Medical Director ITP review done, changes made as directed, and signed approval by Medical Director.                Comments:

## 2020-11-17 ENCOUNTER — Other Ambulatory Visit: Payer: Self-pay

## 2020-11-17 ENCOUNTER — Encounter: Payer: No Typology Code available for payment source | Admitting: *Deleted

## 2020-11-17 DIAGNOSIS — C3490 Malignant neoplasm of unspecified part of unspecified bronchus or lung: Secondary | ICD-10-CM | POA: Diagnosis not present

## 2020-11-17 NOTE — Progress Notes (Signed)
Daily Session Note  Patient Details  Name: Levi [name] MRN: [id number] Date of Birth: 04/07/1941 Referring Provider:   Flowsheet Row Pulmonary Rehab from 08/31/2020 in [facility] Cardiac and Pulmonary Rehab  Referring Provider Woods, Samuel MD       Encounter Date: 11/17/2020  Check In:  Session Check In - 11/17/20 0919       Check-In   Supervising physician immediately available to respond to emergencies See telemetry face sheet for immediately available ER MD    Location [facility]-Cardiac & Pulmonary Rehab    Staff Present Kelly Bollinger, MPA, RN;Jessica Hawkins, MA, RCEP, CCRP, CCET;Kara Langdon, MS, ASCM CEP, Exercise Physiologist;Laureen Brown, BS, RRT, CPFT;Susanne Bice, RN, BSN, CCRP    Virtual Visit No    Medication changes reported     No    Fall or balance concerns reported    No    Tobacco Cessation No Change    Warm-up and Cool-down Performed on first and last piece of equipment    Resistance Training Performed Yes    VAD Patient? No    PAD/SET Patient? No      Pain Assessment   Currently in Pain? No/denies                Social History   Tobacco Use  Smoking Status Former   Packs/day: 0.25   Years: 62.00   Pack years: 15.50   Types: Cigarettes   Quit date: 09/05/2018   Years since quitting: 2.2  Smokeless Tobacco Never    Goals Met:  Proper associated with RPD/PD & O2 Sat Independence with exercise equipment Using PLB without cueing & demonstrates good technique Exercise tolerated well No report of cardiac concerns or symptoms Strength training completed today  Goals Unmet:  Not Applicable  Comments: Pt able to follow exercise prescription today without complaint.  Will continue to monitor for progression.    Dr. Mark Miller is Medical Director for [facility].  Dr. Fuad Aleskerov is Medical Director for [facility]. 

## 2020-11-19 ENCOUNTER — Other Ambulatory Visit: Payer: Self-pay

## 2020-11-19 ENCOUNTER — Encounter: Payer: No Typology Code available for payment source | Admitting: *Deleted

## 2020-11-19 DIAGNOSIS — C3490 Malignant neoplasm of unspecified part of unspecified bronchus or lung: Secondary | ICD-10-CM

## 2020-11-19 NOTE — Progress Notes (Signed)
Daily Session Note  Patient Details  Name: Levi [name] MRN: [id number] Date of Birth: 1940/12/09 Referring Provider:   Flowsheet Row Pulmonary Rehab from 08/31/2020 in [facility] Card[facility] and [facility]  Referring Provider [name] MD       Encounter Date: 11/19/2020  Check In:  Session Check In - 11/19/20 0937       Check-In   Supervising physician immediately available to respond to emergencies See telemetry face sheet for immediately available ER MD    Location [facility]-Cardiac & Pulmonary Rehab    Staff Present Heath Lark, RN, BSN, Laveda Norman, BS, ACSM CEP, Exercise Physiologist;Amanda Oletta Darter, [place], ACSM CEP, Exercise Physiologist    Virtual Visit No    Medication changes reported     No    Fall or balance concerns reported    No    Warm-up and Cool-down Performed on first and last piece of equipment    Resistance Training Performed Yes    VAD Patient? No    PAD/SET Patient? No      Pain Assessment   Currently in Pain? No/denies                Social History   Tobacco Use  Smoking Status Former   Packs/day: 0.25   Years: 62.00   Pack years: 15.50   Types: Cigarettes   Quit date: 09/05/2018   Years since quitting: 2.2  Smokeless Tobacco Never    Goals Met:  Proper associated with RPD/PD & O2 Sat Independence with exercise equipment Exercise tolerated well No report of cardiac concerns or symptoms  Goals Unmet:  Not Applicable  Comments: Pt able to follow exercise prescription today without complaint.  Will continue to monitor for progression.    Dr. Emily Filbert is Medical Director for [place].  Dr. Ottie Glazier is Medical Director for [facility] Pulmonary Rehabilitation.

## 2020-11-20 ENCOUNTER — Other Ambulatory Visit: Payer: Self-pay | Admitting: Oncology

## 2020-11-20 ENCOUNTER — Encounter: Payer: Self-pay | Admitting: *Deleted

## 2020-11-20 ENCOUNTER — Telehealth: Payer: Self-pay | Admitting: *Deleted

## 2020-11-20 DIAGNOSIS — C3490 Malignant neoplasm of unspecified part of unspecified bronchus or lung: Secondary | ICD-10-CM

## 2020-11-20 MED ORDER — MOLNUPIRAVIR EUA 200MG CAPSULE: 4.0000 | ORAL_CAPSULE | Freq: Two times a day (BID) | ORAL | 0 refills | Status: AC

## 2020-11-20 NOTE — Telephone Encounter (Signed)
Per pt's wife, pt tested positive for covid and is asking if pt can be started on any treatment to prevent serious side effects from covid. Pt's symptoms of congestion, cough, and fever started yesterday (7/21).   Spoke with Sonia Baller who recommended for pt to start on monupiravir. Pt's wife made aware that prescription will be sent into his pharmacy ([facility] [place]) and to call back with any worsening symptoms. Pt's wife verbalized understanding.

## 2020-11-20 NOTE — Progress Notes (Signed)
Re: Covid Infection  Symptoms started yesterday.  Patient is high risk for developing complications due to past medical history.  Recommend an oral antiviral but unfortunately his kidney function is not great nor do we have up-to-date lab work.  Will send him in molnupiravir X 5 days.   Review medication interactions.   Faythe Casa, NP 11/20/2020 12:23 PM

## 2020-11-20 NOTE — Progress Notes (Signed)
Yug has tested positive for COVID and will be out at least the next 2 weeks

## 2020-11-20 NOTE — Telephone Encounter (Signed)
[name] called to state that Levi [name] has tested positive for COVID today. He is reaching out to his medical team for any treatments available.

## 2020-11-20 NOTE — Telephone Encounter (Signed)
Mrs Skog called to state that Stran has tested positive for COVID today. He is reaching out to his medical team for any treatments available.

## 2020-12-01 ENCOUNTER — Encounter: Payer: No Typology Code available for payment source | Attending: Internal Medicine

## 2020-12-01 DIAGNOSIS — C3490 Malignant neoplasm of unspecified part of unspecified bronchus or lung: Secondary | ICD-10-CM | POA: Insufficient documentation

## 2020-12-02 ENCOUNTER — Encounter: Payer: Self-pay | Admitting: Oncology

## 2020-12-03 ENCOUNTER — Telehealth: Payer: Self-pay

## 2020-12-03 DIAGNOSIS — C3490 Malignant neoplasm of unspecified part of unspecified bronchus or lung: Secondary | ICD-10-CM

## 2020-12-03 NOTE — Progress Notes (Unsigned)
Twain has not attended since last exercise review.

## 2020-12-03 NOTE — Telephone Encounter (Signed)
Patient still not feeling well post COVID, wants to come back to rehab next Tuesday- reiterated must be symptom free and feeling well to come back. Patient or wife will call us next week to let us know how he is feeling and if he  will come back on 8/9.

## 2020-12-08 ENCOUNTER — Other Ambulatory Visit: Payer: Self-pay

## 2020-12-08 DIAGNOSIS — C3490 Malignant neoplasm of unspecified part of unspecified bronchus or lung: Secondary | ICD-10-CM

## 2020-12-08 NOTE — Progress Notes (Signed)
Daily Session Note  Patient Details  Name: Levi [name] MRN: [id number] Date of Birth: [birth year] Referring Provider:   Flowsheet Row Pulmonary Rehab from 08/31/2020 in [facility] Card[facility] Pulmonary Re[facility]  Referring Provider [name] MD       Encounter Date: 12/08/2020  Check In:  Session Check In - 12/08/20 0917       Check-In   Supervising physician immediately available to respond to emergencies See telemetry face sheet for immediately available ER MD    Location [facility]-Cardiac & Pulmonary Rehab    Staff Present Birdie Sons, MPA, RN;Jessica [place], MA, RCEP, CCRP, CCET;Amanda Sommer, BA, ACSM CEP, Exercise Physiologist    Virtual Visit No    Medication changes reported     No    Fall or balance concerns reported    No    Tobacco Cessation No Change    Warm-up and Cool-down Performed on first and last piece of equipment    Resistance Training Performed Yes    VAD Patient? No    PAD/SET Patient? No      Pain Assessment   Currently in Pain? No/denies                Social History   Tobacco Use  Smoking Status Former   Packs/day: 0.25   Years: 62.00   Pack years: 15.50   Types: Cigarettes   Quit date: 09/05/2018   Years since quitting: 2.2  Smokeless Tobacco Never    Goals Met:  Independence with exercise equipment Exercise tolerated well Personal goals reviewed No report of cardiac concerns or symptoms Strength training completed today  Goals Unmet:  Not Applicable  Comments: Pt able to follow exercise prescription today without complaint.  Will continue to monitor for progression.    Dr. Emily Filbert is Medical Director for [place].  Dr. Ottie Glazier is Medical Director for [facility] Pulmonary Rehabilitation.

## 2020-12-09 ENCOUNTER — Encounter: Payer: Self-pay | Admitting: *Deleted

## 2020-12-09 DIAGNOSIS — C3490 Malignant neoplasm of unspecified part of unspecified bronchus or lung: Secondary | ICD-10-CM

## 2020-12-09 NOTE — Progress Notes (Signed)
Pulmonary Individual Treatment Plan  Patient Details  Name: Levi [name] MRN: [id number] Date of Birth: [birth year] Referring Provider:   Flowsheet Row Pulmonary Rehab from 08/31/2020 in [facility] Cardiac and Pulmonary Rehab  Referring Provider [name] MD       Initial Encounter Date:  Flowsheet Row Pulmonary Rehab from 08/31/2020 in [facility] Cardiac and Pulmonary Rehab  Date 08/31/20       Visit Diagnosis: Malignant neoplasm of lung, unspecified laterality, unspecified part of lung ([place])  Patient's Home Medications on Admission:  Current Outpatient Medications:    albuterol (PROVENTIL HFA;VENTOLIN HFA) 108 (90 Base) MCG/ACT inhaler, Inhale 2 puffs into the lungs every 6 (six) hours as needed for wheezing or shortness of breath., Disp: 1 Inhaler, Rfl: 0   apixaban (ELIQUIS) 5 MG TABS tablet, Take 1 tablet (5 mg total) by mouth 2 (two) times daily., Disp: 180 tablet, Rfl: 3   budesonide-formoterol (SYMBICORT) 160-4.5 MCG/ACT inhaler, Inhale 2 puffs into the lungs 2 (two) times daily., Disp: , Rfl:    diltiazem (DILACOR XR) 120 MG 24 hr capsule, Take 120 mg by mouth daily., Disp: , Rfl:    levothyroxine (SYNTHROID) 137 MCG tablet, Take 137 mcg by mouth daily before breakfast., Disp: , Rfl:    tamsulosin (FLOMAX) 0.4 MG CAPS capsule, Take 0.4 mg by mouth daily. 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[place])    Prostate enlargement    Shortness of breath    Squamous cell carcinoma of lung, left (HCC)    Thyroid disease    Tobacco abuse     Tobacco  Use: Social History   Tobacco Use  Smoking Status Former   Packs/day: 0.25   Years: 62.00   Pack years: 15.50   Types: Cigarettes   Quit date: 09/05/2018   Years since quitting: 2.2  Smokeless Tobacco Never    Labs: Recent Review Scientist, physiological     Labs for ITP Cardiac and Pulmonary Rehab Latest Ref Rng & Units 10/15/2018   PHART 7.350 - 7.450 7.42   PCO2ART 32.0 - 48.0 mmHg 35   HCO3 20.0 - 28.0 mmol/L 22.7   ACIDBASEDEF 0.0 - 2.0 mmol/L 1.3   O2SAT % 93.1        Pulmonary Assessment Scores:  Pulmonary Assessment Scores     Row Name 08/31/20 1237         ADL UCSD   ADL Phase Entry     SOB Score total 53     Rest 0     Walk 2     Stairs 4     Bath 3     Dress 3     Shop 2           CAT Score     CAT Score 26           mMRC Score     mMRC Score 3             UCSD:  Self-administered rating of dyspnea associated with activities of daily living (ADLs) 6-point scale (0 = "not at all" to 5 = "maximal or unable to do because of breathlessness")  Scoring Scores range from 0 to 120.  Minimally important difference is 5 units  CAT: CAT can identify the health impairment of COPD patients and is better correlated with disease progression.  CAT has a scoring range of zero to 40. The CAT score is classified into four groups of low (less than 10), medium (10 - 20), high (21-30) and very high (31-40) based on the impact level of disease on health status. A CAT score over 10 suggests significant symptoms.  A worsening CAT score could be explained by an exacerbation, poor medication adherence, poor inhaler technique, or progression of COPD or comorbid conditions.  CAT MCID is 2 points  mMRC: mMRC (Modified Medical Research Council) Dyspnea Scale is used to assess the degree of baseline functional disability in patients of respiratory disease due to dyspnea. No minimal important difference is established. A decrease in score of 1 point or greater is considered a  positive change.   Pulmonary Function Assessment:   Exercise Target Goals: Exercise Program Goal: Individual exercise prescription set using results from initial 6 min walk test and THRR while considering  patient's activity barriers and safety.   Exercise Prescription Goal: Initial exercise prescription builds to 30-45 minutes a day of aerobic activity, 2-3 days per week.  Home exercise guidelines will be given to patient during program as part of exercise prescription that the participant will acknowledge.  Education: Aerobic Exercise: - Group verbal and visual presentation on the components of exercise prescription. Introduces F.I.T.T principle from ACSM for exercise prescriptions.  Reviews F.I.T.T. principles of aerobic exercise including progression. Written material given at graduation.   Education: Resistance Exercise: - Group verbal and visual presentation on the components of exercise prescription. Introduces F.I.T.T principle from ACSM for exercise prescriptions  Reviews F.I.T.T. principles of resistance exercise including progression. Written material given at graduation.    Education: Exercise & Equipment Safety: - Individual verbal instruction and demonstration of equipment use and safety with use of the equipment. Flowsheet Row Pulmonary Rehab from 10/08/2020 in [facility] Cardiac and Pulmonary Rehab  Education need identified 08/31/20  Date 08/31/20  Educator [place]  Instruction Review Code 1- Verbalizes Understanding       Education: Exercise Physiology & General Exercise Guidelines: - Group verbal and written instruction with models to review the exercise physiology of the cardiovascular system and associated critical values. Provides general exercise guidelines with specific guidelines to those with heart or lung disease.    Education: Flexibility, Balance, Mind/Body Relaxation: - Group verbal and visual presentation with interactive activity on the components of exercise  prescription. Introduces F.I.T.T principle from ACSM for exercise prescriptions. Reviews F.I.T.T. principles of flexibility and balance exercise training including progression. Also discusses the mind body connection.  Reviews various relaxation techniques to help reduce and manage stress (i.e. Deep breathing, progressive muscle relaxation, and visualization). Balance handout provided to take home. Written material given at graduation. Flowsheet Row Pulmonary Rehab from 10/08/2020 in [facility] Cardiac and Pulmonary Rehab  Date 09/03/20  Educator AS  Instruction Review Code 1- Verbalizes Understanding       Activity Barriers & Risk Stratification:  Activity Barriers & Cardiac Risk Stratification - 08/31/20 1234       Activity Barriers & Cardiac Risk Stratification   Activity Barriers Shortness of Breath;Deconditioning;Muscular Weakness;Other (comment)    Comments Sciatica  6 Minute Walk:  6 Minute Walk     Row Name 08/31/20 1240         6 Minute Walk   Phase Initial     Distance 780 feet     Walk Time 6 minutes     # of Rest Breaks 0     MPH 1.47     METS 1.08     RPE 13     Perceived Dyspnea  1     VO2 Peak 3.81     Symptoms Yes (comment)     Comments Lower back pain 7/10     Resting HR 82 bpm     Resting BP 132/78     Resting Oxygen Saturation  94 %     Exercise Oxygen Saturation  during 6 min walk 86 %     Max Ex. HR 101 bpm     Max Ex. BP 144/76     2 Minute Post BP 124/78           Interval HR     1 Minute HR 93     2 Minute HR 97     3 Minute HR 98     4 Minute HR 99     5 Minute HR 101     6 Minute HR 100     2 Minute Post HR 87     Interval Heart Rate? Yes           Interval Oxygen     Interval Oxygen? Yes     Baseline Oxygen Saturation % 94 %     1 Minute Oxygen Saturation % 90 %     1 Minute Liters of Oxygen 0 L  RA     2 Minute Oxygen Saturation % 87 %     2 Minute Liters of Oxygen 0 L     3 Minute Oxygen Saturation % 86 %     3  Minute Liters of Oxygen 0 L     4 Minute Oxygen Saturation % 87 %     4 Minute Liters of Oxygen 0 L     5 Minute Oxygen Saturation % 86 %     5 Minute Liters of Oxygen 0 L     6 Minute Oxygen Saturation % 88 %     6 Minute Liters of Oxygen 0 L     2 Minute Post Oxygen Saturation % 94 %     2 Minute Post Liters of Oxygen 0 L            Oxygen Initial Assessment:  Oxygen Initial Assessment - 08/31/20 1237       Home Oxygen   Home Oxygen Device None    Sleep Oxygen Prescription None    Home Exercise Oxygen Prescription None    Home Resting Oxygen Prescription None      Initial 6 min Walk   Oxygen Used None      Program Oxygen Prescription   Program Oxygen Prescription None      Intervention   Short Term Goals To learn and understand importance of monitoring SPO2 with pulse oximeter and demonstrate accurate use of the pulse oximeter.;To learn and understand importance of maintaining oxygen saturations>88%;To learn and demonstrate proper pursed lip breathing techniques or other breathing techniques. ;To learn and demonstrate proper use of respiratory medications    Long  Term Goals Verbalizes importance of monitoring SPO2 with pulse oximeter and return demonstration;Maintenance of O2 saturations>88%;Exhibits proper  breathing techniques, such as pursed lip breathing or other method taught during program session;Compliance with respiratory medication;Demonstrates proper use of MDI's             Oxygen Re-Evaluation:  Oxygen Re-Evaluation     Row Name 09/03/20 0950 11/05/20 0932           Program Oxygen Prescription   Program Oxygen Prescription None None             Home Oxygen      Home Oxygen Device None None      Sleep Oxygen Prescription None None      Home Exercise Oxygen Prescription None None      Home Resting Oxygen Prescription None None             Goals/Expected Outcomes      Short Term Goals To learn and understand importance of monitoring SPO2 with  pulse oximeter and demonstrate accurate use of the pulse oximeter.;To learn and understand importance of maintaining oxygen saturations>88%;To learn and demonstrate proper pursed lip breathing techniques or other breathing techniques.  To learn and understand importance of monitoring SPO2 with pulse oximeter and demonstrate accurate use of the pulse oximeter.;To learn and understand importance of maintaining oxygen saturations>88%;To learn and demonstrate proper pursed lip breathing techniques or other breathing techniques.       Long  Term Goals Verbalizes importance of monitoring SPO2 with pulse oximeter and return demonstration;Maintenance of O2 saturations>88%;Exhibits proper breathing techniques, such as pursed lip breathing or other method taught during program session;Exhibits compliance with exercise, home  and travel O2 prescription Verbalizes importance of monitoring SPO2 with pulse oximeter and return demonstration;Maintenance of O2 saturations>88%;Exhibits proper breathing techniques, such as pursed lip breathing or other method taught during program session;Exhibits compliance with exercise, home  and travel O2 prescription      Comments Reviewed PLB technique with pt.  Talked about how it works and it's importance in maintaining their exercise saturations. He continues to practice PLB and he notices his O2 will come back up. When exercising his O2 is about 89-93 at home. He feels that his breathing has improved overall and now he can do more things at home (ADLs). He still has good and bad days.      Goals/Expected Outcomes Short: Become more profiecient at using PLB.   Long: Become independent at using PLB. Short: Become more profiecient at using PLB, continue to exercise at home  Long: Become independent at using PLB.              Oxygen Discharge (Final Oxygen Re-Evaluation):  Oxygen Re-Evaluation - 11/05/20 0932       Program Oxygen Prescription   Program Oxygen Prescription None       Home Oxygen   Home Oxygen Device None    Sleep Oxygen Prescription None    Home Exercise Oxygen Prescription None    Home Resting Oxygen Prescription None      Goals/Expected Outcomes   Short Term Goals To learn and understand importance of monitoring SPO2 with pulse oximeter and demonstrate accurate use of the pulse oximeter.;To learn and understand importance of maintaining oxygen saturations>88%;To learn and demonstrate proper pursed lip breathing techniques or other breathing techniques.     Long  Term Goals Verbalizes importance of monitoring SPO2 with pulse oximeter and return demonstration;Maintenance of O2 saturations>88%;Exhibits proper breathing techniques, such as pursed lip breathing or other method taught during program session;Exhibits compliance with exercise, home  and travel O2 prescription  Comments He continues to practice PLB and he notices his O2 will come back up. When exercising his O2 is about 89-93 at home. He feels that his breathing has improved overall and now he can do more things at home (ADLs). He still has good and bad days.    Goals/Expected Outcomes Short: Become more profiecient at using PLB, continue to exercise at home  Long: Become independent at using PLB.             Initial Exercise Prescription:  Initial Exercise Prescription - 08/31/20 1200       Date of Initial Exercise RX and Referring Provider   Date 08/31/20    Referring Provider [name] MD      Recumbant Bike   Level 1    RPM 60    Watts 10    Minutes 15    METs 1      NuStep   Level 1    SPM 80    Minutes 15    METs 1      Track   Laps 10    Minutes 15    METs 1      Prescription Details   Frequency (times per week) 2    Duration Progress to 30 minutes of continuous aerobic without signs/symptoms of physical distress      Intensity   THRR 40-80% of Max Heartrate 106-130    Ratings of Perceived Exertion 11-13    Perceived Dyspnea 0-4      Progression    Progression Continue to progress workloads to maintain intensity without signs/symptoms of physical distress.      Resistance Training   Training Prescription Yes    Weight 3 lb    Reps 10-15             Perform Capillary Blood Glucose checks as needed.  Exercise Prescription Changes:   Exercise Prescription Changes     Row Name 08/31/20 1200 09/08/20 1300 09/23/20 1400 10/06/20 1400 10/20/20 1500     Response to Exercise   Blood Pressure (Admit) 132/78 128/82 128/76 146/80 126/76   Blood Pressure (Exercise) 144/76 146/64 142/78 130/76 --   Blood Pressure (Exit) 124/78 144/74 128/76 116/70 126/74   Heart Rate (Admit) 82 bpm 88 bpm 76 bpm 87 bpm 73 bpm   Heart Rate (Exercise) 101 bpm 97 bpm 95 bpm 96 bpm 99 bpm   Heart Rate (Exit) 87 bpm 88 bpm 86 bpm 94 bpm 92 bpm   Oxygen Saturation (Admit) 94 % 90 % 92 % 90 % 93 %   Oxygen Saturation (Exercise) 86 % 89 % 89 % 91 % 91 %   Oxygen Saturation (Exit) 94 % 92 % 93 % 93 % 92 %   Rating of Perceived Exertion (Exercise) _0 Perceived Dyspnea (Exercise) _1 Symptoms Back pain 7/10, SOB none none none SOB   Comments walk test results -- -- -- --   Duration -- Progress to 30 minutes of  aerobic without signs/symptoms of physical distress Continue with 30 min of aerobic exercise without signs/symptoms of physical distress. Continue with 30 min of aerobic exercise without signs/symptoms of physical distress. Continue with 30 min of aerobic exercise without signs/symptoms of physical distress.   Intensity -- THRR unchanged THRR unchanged THRR unchanged THRR unchanged     Progression   Progression -- Continue to progress workloads to maintain intensity without signs/symptoms of physical distress. Continue  to progress workloads to maintain intensity without signs/symptoms of physical distress. Continue to progress workloads to maintain intensity without signs/symptoms of physical distress. Continue to progress workloads  to maintain intensity without signs/symptoms of physical distress.   Average METs -- 2 2.15 2.5 2.15     Resistance Training   Training Prescription -- Yes Yes Yes Yes   Weight -- 3 lb 3 lb 5 lb 5 lb   Reps -- 10-15 10-15 10-15 10-15     Interval Training   Interval Training -- -- No No No     NuStep   Level -- -- _0 Minutes -- -- _1 METs -- -- 2.3 2.5 2.2     Biostep-RELP   Level -- 2 -- -- --   Minutes -- 15 -- -- --   METs -- 2 -- -- --     Track   Laps -- _2 Minutes -- _3 METs -- -- 2 -- 2.1     Home Exercise Plan   Plans to continue exercise at -- -- -- -- [employer] (comment)  [employer]   Frequency -- -- -- -- Add 2 additional days to program exercise sessions.   Initial Home Exercises Provided -- -- -- -- 10/08/20    Row Name 11/05/20 0900 11/18/20 1500           Response to Exercise   Blood Pressure (Admit) 142/82 132/76      Blood Pressure (Exit) 134/74 126/62      Heart Rate (Admit) 73 bpm 82 bpm      Heart Rate (Exercise) 93 bpm 101 bpm      Heart Rate (Exit) 78 bpm 90 bpm      Oxygen Saturation (Admit) 91 % 89 %      Oxygen Saturation (Exercise) 88 % 90 %      Oxygen Saturation (Exit) 96 % 96 %      Rating of Perceived Exertion (Exercise) 13 13      Perceived Dyspnea (Exercise) 2 2      Symptoms SOB SOB      Duration Continue with 30 min of aerobic exercise without signs/symptoms of physical distress. Continue with 30 min of aerobic exercise without signs/symptoms of physical distress.      Intensity THRR unchanged THRR unchanged             Progression      Progression Continue to progress workloads to maintain intensity without signs/symptoms of physical distress. Continue to progress workloads to maintain intensity without signs/symptoms of physical distress.      Average METs 2.05 1.99             Resistance Training      Training Prescription Yes Yes      Weight 5 lb 5 lb      Reps 10-15 10-15              Interval Training      Interval Training No No             NuStep      Level 4 4      Minutes 15 15      METs 2.1 2             Biostep-RELP      Level 4 --      Minutes 15 --  METs 2.2 --             Track      Laps 20 19      Minutes 2.14 15      METs 1.98 2.03             Home Exercise Plan      Plans to continue exercise at [employer] (comment)  [place] (comment)  [employer]      Frequency Add 2 additional days to program exercise sessions. Add 2 additional days to program exercise sessions.      Initial Home Exercises Provided 10/08/20 10/08/20              Exercise Comments:   Exercise Comments     Row Name 09/03/20 0949 11/20/20 1329         Exercise Comments First full day of exercise!  Patient was oriented to gym and equipment including functions, settings, policies, and procedures.  Patient's individual exercise prescription and treatment plan were reviewed.  All starting workloads were established based on the results of the 6 minute walk test done at initial orientation visit.  The plan for exercise progression was also introduced and progression will be customized based on patient's performance and goals. Rafael has tested positive for COVID and will be out at least the next 2 weeks               Exercise Goals and Review:   Exercise Goals     Row Name 08/31/20 1254             Exercise Goals   Increase Physical Activity Yes       Intervention Provide advice, education, support and counseling about physical activity/exercise needs.;Develop an individualized exercise prescription for aerobic and resistive training based on initial evaluation findings, risk stratification, comorbidities and participant's personal goals.       Expected Outcomes Short Term: Attend rehab on a regular basis to increase amount of physical activity.;Long Term: Add in home exercise to make exercise part of routine and to increase amount of  physical activity.;Long Term: Exercising regularly at least 3-5 days a week.       Increase Strength and Stamina Yes       Intervention Provide advice, education, support and counseling about physical activity/exercise needs.;Develop an individualized exercise prescription for aerobic and resistive training based on initial evaluation findings, risk stratification, comorbidities and participant's personal goals.       Expected Outcomes Short Term: Increase workloads from initial exercise prescription for resistance, speed, and METs.;Short Term: Perform resistance training exercises routinely during rehab and add in resistance training at home;Long Term: Improve cardiorespiratory fitness, muscular endurance and strength as measured by increased METs and functional capacity (6MWT)       Able to understand and use rate of perceived exertion (RPE) scale Yes       Intervention Provide education and explanation on how to use RPE scale       Expected Outcomes Short Term: Able to use RPE daily in rehab to express subjective intensity level;Long Term:  Able to use RPE to guide intensity level when exercising independently       Able to understand and use Dyspnea scale Yes       Intervention Provide education and explanation on how to use Dyspnea scale       Expected Outcomes Short Term: Able to use Dyspnea scale daily in rehab to express subjective sense of shortness of breath  during exertion;Long Term: Able to use Dyspnea scale to guide intensity level when exercising independently       Knowledge and understanding of Target Heart Rate Range (THRR) Yes       Intervention Provide education and explanation of THRR including how the numbers were predicted and where they are located for reference       Expected Outcomes Short Term: Able to state/look up THRR;Short Term: Able to use daily as guideline for intensity in rehab;Long Term: Able to use THRR to govern intensity when exercising independently       Able to  check pulse independently Yes       Intervention Provide education and demonstration on how to check pulse in carotid and radial arteries.;Review the importance of being able to check your own pulse for safety during independent exercise       Expected Outcomes Short Term: Able to explain why pulse checking is important during independent exercise;Long Term: Able to check pulse independently and accurately       Understanding of Exercise Prescription Yes       Intervention Provide education, explanation, and written materials on patient's individual exercise prescription       Expected Outcomes Short Term: Able to explain program exercise prescription;Long Term: Able to explain home exercise prescription to exercise independently                Exercise Goals Re-Evaluation :  Exercise Goals Re-Evaluation     Row Name 09/03/20 0949 09/23/20 1454 10/06/20 1424 10/08/20 0944 10/20/20 1547     Exercise Goal Re-Evaluation   Exercise Goals Review Increase Physical Activity;Able to understand and use rate of perceived exertion (RPE) scale;Knowledge and understanding of Target Heart Rate Range (THRR);Understanding of Exercise Prescription;Increase Strength and Stamina;Able to understand and use Dyspnea scale;Able to check pulse independently Increase Physical Activity;Increase Strength and Stamina;Understanding of Exercise Prescription Increase Physical Activity;Increase Strength and Stamina Increase Physical Activity;Increase Strength and Stamina Increase Physical Activity;Increase Strength and Stamina;Understanding of Exercise Prescription   Comments Reviewed RPE and dyspnea scales, THR and program prescription with pt today.  Pt voiced understanding and was given a copy of goals to take home. Nechemia is off to a good start in rehab.  He is up to 19 laps on the track. He did drop back to level 1 on the NuStep and we will try to get him back up to level 3.  We will continue to monitor his progress.  Sim has progressed to level 4 on NS.  He has also increased to 5 lb for strength work.  We will monitor progress. Reviewed home exercise with pt today.  [facility]  Reviewed THR, pulse, RPE, sign and symptoms, pulse oximetery and when to call 911 or MD.  Also discussed weather considerations and indoor options.  Pt voiced understanding. Celestino has been doing well in rehab.  He is up to level 4 on the NuStep.  We will continue to monitor his progress.   Expected Outcomes Short: Use RPE daily to regulate intensity. Long: Follow program prescription in THR. Short: Continue to attend rehab regularly Long: Conitnue to follow program prescription Short: reach 20 laps on track Long:  continue to build stamina Short:  work up to walking 30 min at home Long:  continue to build stamina Short: Continue to build up stamina on the track [place] to improve stamina    Row Name 11/05/20 [phone] 11/05/20 0929 11/18/20  1524 12/08/20 0924       Exercise Goal Re-Evaluation   Exercise Goals Review Increase Physical Activity;Increase Strength and Stamina;Understanding of Exercise Prescription Increase Physical Activity;Increase Strength and Stamina;Understanding of Exercise Prescription Increase Physical Activity;Increase Strength and Stamina;Understanding of Exercise Prescription Increase Physical Activity;Increase Strength and Stamina    Comments Donnel has been progressing. O2 sats are maintaining above 88% and has increased his level on the Biostep to level 4. Will continue to monitor. Darrion reports not doing much exercise at home right now. [facility] and he is walking (30-40 minutes/day - RPE 11) at home as well as some squats. He reports his O2 is 89-93 at home when exercising. Jamell is doing well in rehab.  He is on level 4 on the NuStep and will walk intermittenly.  We will continue to monitor his progress. Today is Zerbe first day back.  Oxygen and  shortness of breath ae the same as before he was out.  We wiill monitor progress.    Expected Outcomes Short: Continue to increase number of laps on track Long: Continue to increase overall MET level ST: continue to attend rehab, [facility] LT: continue to increase MET level Short: Continue to try to walk more Long: Continue to improve stamina Short: get back to regular attendance Long: build overall stamina             Discharge Exercise Prescription (Final Exercise Prescription Changes):  Exercise Prescription Changes - 11/18/20 1500       Response to Exercise   Blood Pressure (Admit) 132/76    Blood Pressure (Exit) 126/62    Heart Rate (Admit) 82 bpm    Heart Rate (Exercise) 101 bpm    Heart Rate (Exit) 90 bpm    Oxygen Saturation (Admit) 89 %    Oxygen Saturation (Exercise) 90 %    Oxygen Saturation (Exit) 96 %    Rating of Perceived Exertion (Exercise) 13    Perceived Dyspnea (Exercise) 2    Symptoms SOB    Duration Continue with 30 min of aerobic exercise without signs/symptoms of physical distress.    Intensity THRR unchanged      Progression   Progression Continue to progress workloads to maintain intensity without signs/symptoms of physical distress.    Average METs 1.99      Resistance Training   Training Prescription Yes    Weight 5 lb    Reps 10-15      Interval Training   Interval Training No      NuStep   Level 4    Minutes 15    METs 2      Track   Laps 19    Minutes 15    METs 2.03      Home Exercise Plan   Plans to continue exercise at [employer] (comment)   [facility]   Frequency Add 2 additional days to program exercise sessions.    Initial Home Exercises Provided 10/08/20             Nutrition:  Target Goals: Understanding of nutrition guidelines, daily intake of sodium <1567m, cholesterol <2038m calories 30% from fat and 7% or less from saturated fats, daily to have 5 or more servings of fruits and vegetables.  Education: All  About Nutrition: -Group instruction provided by verbal, written material, interactive activities, discussions, models, and posters to present general guidelines for heart healthy nutrition including fat, fiber, MyPlate, the role of sodium in heart healthy  nutrition, utilization of the nutrition label, and utilization of this knowledge for meal planning. Follow up email sent as well. Written material given at graduation. Flowsheet Row Pulmonary Rehab from 10/08/2020 in [facility] Cardiac and Pulmonary Rehab  Date 09/10/20  Educator [facility]  Instruction Review Code 1- Verbalizes Understanding       Biometrics:  Pre Biometrics - 08/31/20 1233       Pre Biometrics   Height 5' 10.75" (1.797 m)    Weight 271 lb 9.6 oz (123.2 kg)    BMI (Calculated) 38.15    Single Leg Stand 8.16 seconds              Nutrition Therapy Plan and Nutrition Goals:  Nutrition Therapy & Goals - 09/17/20 1146       Nutrition Therapy   RD appointment deferred Yes   Donnivan does not want to meet with the RD at this time - will continue to check in.            Nutrition Assessments:  MEDIFICTS Score Key: ?70 Need to make dietary changes  40-70 Heart Healthy Diet ? 40 Therapeutic Level Cholesterol Diet  Flowsheet Row Pulmonary Rehab from 08/31/2020 in [facility] Cardiac and Pulmonary Rehab  Picture Your Plate Total Score on Admission 54      Picture Your Plate Scores: <04 Unhealthy dietary pattern with much room for improvement. 41-50 Dietary pattern unlikely to meet recommendations for good health and room for improvement. 51-60 More healthful dietary pattern, with some room for improvement.  >60 Healthy dietary pattern, although there may be some specific behaviors that could be improved.   Nutrition Goals Re-Evaluation:  Nutrition Goals Re-Evaluation     [place] Name 10/08/20 0933 11/05/20 0936 12/08/20 0929         Goals   Comment Corrin has not met with RD yet He reports doing well with his nutrition and  his wife keeps him on track. Continues to defer nutrition consult. Gadiel has been out with covid - today is his first day back              Nutrition Goals Discharge (Final Nutrition Goals Re-Evaluation):  Nutrition Goals Re-Evaluation - 12/08/20 0929       Goals   Comment Hewitt has been out with covid - today is his first day back             Psychosocial: Target Goals: Acknowledge presence or absence of significant depression and/or stress, maximize coping skills, provide positive support system. Participant is able to verbalize types and ability to use techniques and skills needed for reducing stress and depression.   Education: Stress, Anxiety, and Depression - Group verbal and visual presentation to define topics covered.  Reviews how body is impacted by stress, anxiety, and depression.  Also discusses healthy ways to reduce stress and to treat/manage anxiety and depression.  Written material given at graduation. Flowsheet Row Pulmonary Rehab from 10/08/2020 in [facility] Cardiac and Pulmonary Rehab  Date 10/08/20  Educator [facility]  Instruction Review Code 1- [employer] Understanding       Education: Sleep Hygiene -Provides group verbal and written instruction about how sleep can affect your health.  Define sleep hygiene, discuss sleep cycles and impact of sleep habits. Review good sleep hygiene tips.    Initial Review & Psychosocial Screening:  Initial Psych Review & Screening - 08/28/20 1310       Initial Review   Current issues with Current Sleep Concerns;Current Stress Concerns   sleeps  on and off all day; sleeps in bed from 3am-10am.   Source of Stress Concerns Unable to participate in former interests or hobbies;Unable to perform yard/household activities      [place]? Yes   wife   Concerns Inappropriate over/under dependence on family/friends      Barriers   Psychosocial barriers to participate in program There are no identifiable  barriers or psychosocial needs.;The patient should benefit from training in stress management and relaxation.      Screening Interventions   Interventions Encouraged to exercise;Provide feedback about the scores to participant;To provide support and resources with identified psychosocial needs    Expected Outcomes Short Term goal: Utilizing psychosocial counselor, staff and physician to assist with identification of specific Stressors or current issues interfering with healing process. Setting desired goal for each stressor or current issue identified.;Long Term Goal: Stressors or current issues are controlled or eliminated.;Short Term goal: Identification and review with participant of any Quality of Life or Depression concerns found by scoring the questionnaire.;Long Term goal: The participant improves quality of Life and PHQ9 Scores as seen by post scores and/or verbalization of changes             Quality of Life Scores:  Scores of 19 and below usually indicate a poorer quality of life in these areas.  A difference of  2-3 points is a clinically meaningful difference.  A difference of 2-3 points in the total score of the Quality of Life Index has been associated with significant improvement in overall quality of life, self-image, physical symptoms, and general health in studies assessing change in quality of life.  PHQ-9: Recent Review Flowsheet Data     Depression screen [facility] 2/9 08/31/2020 10/20/2015 10/16/2015 01/07/2015 12/19/2014   Decreased Interest 1 0 0 0 0   Down, Depressed, Hopeless 0 0 0 0 0   PHQ - 2 Score 1 0 0 0 0   Altered sleeping 0 - - - -   Tired, decreased energy 1 - - - -   Change in appetite 0 - - - -   Feeling bad or failure about yourself  0 - - - -   Trouble concentrating 0 - - - -   Moving slowly or fidgety/restless 0 - - - -   Suicidal thoughts 0 - - - -   PHQ-9 Score 2 - - - -   Difficult doing work/chores Not difficult at all - - - -      Interpretation of  Total Score  Total Score Depression Severity:  1-4 = Minimal depression, 5-9 = Mild depression, 10-14 = Moderate depression, 15-19 = Moderately severe depression, 20-27 = Severe depression   Psychosocial Evaluation and Intervention:  Psychosocial Evaluation - 10/08/20 0937       Psychosocial Evaluation & Interventions   Comments Lela is getting up and around more.  He feels like he can start mowing some on a riding mower.  We discussed heat concerns and not starting with too much time mowing etc.  He goes to sleep between 12 - 2 am and sleeps until 9 or 10 am.  He feels rested.    Expected Outcomes Short:  continue to exercise to build stamina Long: maintain good sleep habits             Psychosocial Re-Evaluation:  Psychosocial Re-Evaluation     Row Name 11/05/20 [phone] 12/08/20 [phone]  Psychosocial Re-Evaluation   Current issues with None Identified None Identified      Comments He reports no stress at this time. He reports sleeping well when he goes to sleep, but will usually go to bed at 1am, he will wake up 7-9am (he feels well rested). He reports not having any hobbies that help him since breathing problems due to lack of energy - he used to fish and ran a ballfield. He leans on his wife for support. Kaston reports no symptomsof anxiety or depression.  Sleep patterns are the same.  He can still rely on his wife for support      Expected Outcomes ST: continue to attend rehab and notify staff of any changes LT: maintain positive attitude Short: continue to exercise to help with stress and sleep Long: maintain positive outlook      Interventions Encouraged to attend Pulmonary Rehabilitation for the exercise --      Continue Psychosocial Services  Follow up required by staff --               Psychosocial Discharge (Final Psychosocial Re-Evaluation):  Psychosocial Re-Evaluation - 12/08/20 8295       Psychosocial Re-Evaluation   Current issues with None Identified     Comments Daunte reports no symptomsof anxiety or depression.  Sleep patterns are the same.  He can still rely on his wife for support    Expected Outcomes Short: continue to exercise to help with stress and sleep Long: maintain positive outlook             Education: Education Goals: Education classes will be provided on a weekly basis, covering required topics. Participant will state understanding/return demonstration of topics presented.  Learning Barriers/Preferences:  Learning Barriers/Preferences - 08/28/20 1305       Learning Barriers/Preferences   Learning Barriers Hearing    Learning Preferences None             General Pulmonary Education Topics:  Infection Prevention: - Provides verbal and written material to individual with discussion of infection control including proper hand washing and proper equipment cleaning during exercise session. Flowsheet Row Pulmonary Rehab from 10/08/2020 in [facility] Cardiac and Pulmonary Rehab  Education need identified 08/31/20  Date 08/31/20  Educator [place]  Instruction Review Code 1- Verbalizes Understanding       Falls Prevention: - Provides verbal and written material to individual with discussion of falls prevention and safety. Flowsheet Row Pulmonary Rehab from 10/08/2020 in [facility] Cardiac and Pulmonary Rehab  Education need identified 08/31/20  Date 08/31/20  Educator [place]  Instruction Review Code 1- Verbalizes Understanding       Chronic Lung Disease Review: - Group verbal instruction with posters, models, PowerPoint presentations and videos,  to review new updates, new respiratory medications, new advancements in procedures and treatments. Providing information on websites and "800" numbers for continued self-education. Includes information about supplement oxygen, available portable oxygen systems, continuous and intermittent flow rates, oxygen safety, concentrators, and Medicare reimbursement for oxygen. Explanation of  Pulmonary Drugs, including class, frequency, complications, importance of spacers, rinsing mouth after steroid MDI's, and proper cleaning methods for nebulizers. Review of basic lung anatomy and physiology related to function, structure, and complications of lung disease. Review of risk factors. Discussion about methods for diagnosing sleep apnea and types of masks and machines for OSA. Includes a review of the use of types of environmental controls: home humidity, furnaces, filters, dust mite/pet prevention, HEPA vacuums. Discussion about weather changes, air quality and  the benefits of nasal washing. Instruction on Warning signs, infection symptoms, calling MD promptly, preventive modes, and value of vaccinations. Review of effective airway clearance, coughing and/or vibration techniques. Emphasizing that all should Create an Action Plan. Written material given at graduation. Flowsheet Row Pulmonary Rehab from 10/08/2020 in [facility] Cardiac and Pulmonary Rehab  Date 10/01/20  Educator [facility]  Instruction Review Code 1- Verbalizes Understanding       AED/CPR: - Group verbal and written instruction with the use of models to demonstrate the basic use of the AED with the basic ABC's of resuscitation.    Anatomy and Cardiac Procedures: - Group verbal and visual presentation and models provide information about basic cardiac anatomy and function. Reviews the testing methods done to diagnose heart disease and the outcomes of the test results. Describes the treatment choices: Medical Management, Angioplasty, or Coronary Bypass Surgery for treating various heart conditions including Myocardial Infarction, Angina, Valve Disease, and Cardiac Arrhythmias.  Written material given at graduation.   Medication Safety: - Group verbal and visual instruction to review commonly prescribed medications for heart and lung disease. Reviews the medication, class of the drug, and side effects. Includes the steps to properly store  meds and maintain the prescription regimen.  Written material given at graduation. Flowsheet Row Pulmonary Rehab from 10/08/2020 in [facility] Cardiac and Pulmonary Rehab  Date 09/17/20  Educator SB  Instruction Review Code 1- Verbalizes Understanding       Other: -Provides group and verbal instruction on various topics (see comments)   Knowledge Questionnaire Score:  Knowledge Questionnaire Score - 08/31/20 1235       Knowledge Questionnaire Score   Pre Score 16/18: Oxygen, Lung disease              Core Components/Risk Factors/Patient Goals at Admission:  Personal Goals and Risk Factors at Admission - 08/31/20 1255       Core Components/Risk Factors/Patient Goals on Admission    Weight Management Yes;Weight Loss    Intervention Weight Management: Develop a combined nutrition and exercise program designed to reach desired caloric intake, while maintaining appropriate intake of nutrient and fiber, sodium and fats, and appropriate energy expenditure required for the weight goal.;Weight Management: Provide education and appropriate resources to help participant work on and attain dietary goals.;Weight Management/Obesity: Establish reasonable short term and long term weight goals.    Admit Weight 271 lb (122.9 kg)    Goal Weight: Short Term 265 lb (120.2 kg)    Goal Weight: Long Term 250 lb (113.4 kg)    Expected Outcomes Short Term: Continue to assess and modify interventions until short term weight is achieved;Long Term: Adherence to nutrition and physical activity/exercise program aimed toward attainment of established weight goal;Weight Loss: Understanding of general recommendations for a balanced deficit meal plan, which promotes 1-2 lb weight loss per week and includes a negative energy balance of [phone] kcal/d;Understanding recommendations for meals to include 15-35% energy as protein, 25-35% energy from fat, 35-60% energy from carbohydrates, less than 245m of dietary cholesterol,  20-35 gm of total fiber daily;Understanding of distribution of calorie intake throughout the day with the consumption of 4-5 meals/snacks    Improve shortness of breath with ADL's Yes    Intervention Provide education, individualized exercise plan and daily activity instruction to help decrease symptoms of SOB with activities of daily living.    Expected Outcomes Short Term: Improve cardiorespiratory fitness to achieve a reduction of symptoms when performing ADLs;Long Term: Be able to perform more ADLs without  symptoms or delay the onset of symptoms    Hypertension Yes    Intervention Provide education on lifestyle modifcations including regular physical activity/exercise, weight management, moderate sodium restriction and increased consumption of fresh fruit, vegetables, and low fat dairy, alcohol moderation, and smoking cessation.;Monitor prescription use compliance.    Expected Outcomes Short Term: Continued assessment and intervention until BP is < 140/64m HG in hypertensive participants. < 130/824mHG in hypertensive participants with diabetes, heart failure or chronic kidney disease.;Long Term: Maintenance of blood pressure at goal levels.             Education:Diabetes - Individual verbal and written instruction to review signs/symptoms of diabetes, desired ranges of glucose level fasting, after meals and with exercise. Acknowledge that pre and post exercise glucose checks will be done for 3 sessions at entry of program.   Know Your Numbers and Heart Failure: - Group verbal and visual instruction to discuss disease risk factors for cardiac and pulmonary disease and treatment options.  Reviews associated critical values for Overweight/Obesity, Hypertension, Cholesterol, and Diabetes.  Discusses basics of heart failure: signs/symptoms and treatments.  Introduces Heart Failure Zone chart for action plan for heart failure.  Written material given at graduation. Flow[facility]  Date 09/24/20  Educator SB  Instruction Review Code 1- Verbalizes Understanding       Core Components/Risk Factors/Patient Goals Review:   Goals and Risk Factor Review     Row Name 10/08/20 [id number]/07/22 0934 12/08/20 0918         Core Components/Risk Factors/Patient Goals Review   Personal Goals Review Weight Management/Obesity;Improve shortness of breath with ADL's Weight Management/Obesity;Improve shortness of breath with ADL's Weight Management/Obesity;Improve shortness of breath with ADL's     Review [place] weight is down 3-4 lb since he started.  He is walking at home as well.  He feels like he can get out and mow the grass some now. (riding mower) He continues to practice PLB and he notices his O2 will come back up. When exercising his O2 is about 89-93 at home. He feels that his breathing has improved overall and now he can do more things at home (ADLs). He still has good and bad days. He reports his weight has been stable. He is taking all his medications as directed and tolerating them well. Nas has been out with Covid.  He feels his breathing is about the same.  Today is his first day back.  He is taking all meds as directed.     Expected Outcomes Short: continue to exercise consistently and monitor weight Long:  reach goal weight Short: continue to exercise consistently and monitor weight Long: reach goal weight Short: get back to consistent attendance Long:  continue to work on weight              Core Components/Risk Factors/Patient Goals at Discharge (Final Review):   Goals and Risk Factor Review - 12/08/20 0918       Core Components/Risk Factors/Patient Goals Review   Personal Goals Review Weight Management/Obesity;Improve shortness of breath with ADL's    Review RiBriscoeas been out with Covid.  He feels his breathing is about the same.  Today is his first day back.  He is taking all meds as directed.     Expected Outcomes Short: get back to consistent attendance Long:  continue to work on [employer]  ITP Comments:  ITP Comments     Row Name 08/28/20 1320 08/31/20 1238 09/03/20 0938 09/16/20 0708 10/14/20 0805   ITP Comments Initial telephone orientation completed. Diagnosis can be found in [facility] 3/17. EP orientation scheduled for 5/2 at 10:30am. Completed 6MWT and gym orientation. Initial ITP created and sent for review to Dr. Emily Filbert, Medical Director. First full day of exercise!  Patient was oriented to gym and equipment including functions, settings, policies, and procedures.  Patient's individual exercise prescription and treatment plan were reviewed.  All starting workloads were established based on the results of the 6 minute walk test done at initial orientation visit.  The plan for exercise progression was also introduced and progression will be customized based on patient's performance and goals. 30 Day review completed. Medical Director ITP review done, changes made as directed, and signed approval by Medical Director.  New to program 30 Day review completed. Medical Director ITP review done, changes made as directed, and signed approval by Medical Director.    [place] Name 11/11/20 0847 11/20/20 1328 12/03/20 1136 12/09/20 0730     ITP Comments 30 Day review completed. Medical Director ITP review done, changes made as directed, and signed approval by Medical Director. Eldon has tested positive for COVID and will be out at least the next 2 weeks Thelbert has not attended since last exercise review. 30 Day review completed. Medical Director ITP review done, changes made as directed, and signed approval by Medical Director.             Comments:

## 2020-12-10 ENCOUNTER — Other Ambulatory Visit: Payer: Self-pay

## 2020-12-10 DIAGNOSIS — C3490 Malignant neoplasm of unspecified part of unspecified bronchus or lung: Secondary | ICD-10-CM

## 2020-12-10 NOTE — Progress Notes (Signed)
Daily Session Note  Patient Details  Name: Levi [name] MRN: [id number] Date of Birth: 1940/05/15 Referring Provider:   Flowsheet Row Pulmonary Rehab from 08/31/2020 in [facility] Cardiac and Pulmonary Rehab  Referring Provider [name] MD       Encounter Date: 12/10/2020  Check In:  Session Check In - 12/10/20 0921       Check-In   Supervising physician immediately available to respond to emergencies See telemetry face sheet for immediately available ER MD    Location [facility]-Cardiac & Pulmonary Rehab    Staff Present Birdie Sons, MPA, Elveria Rising, BA, ACSM CEP, Exercise Physiologist;Jamaiyah Pyle Amedeo Plenty, BS, ACSM CEP, Exercise Physiologist    Virtual Visit No    Medication changes reported     No    Fall or balance concerns reported    No    Tobacco Cessation No Change    Warm-up and Cool-down Performed on first and last piece of equipment    Resistance Training Performed Yes    VAD Patient? No    PAD/SET Patient? No      Pain Assessment   Currently in Pain? No/denies                Social History   Tobacco Use  Smoking Status Former   Packs/day: 0.25   Years: 62.00   Pack years: 15.50   Types: Cigarettes   Quit date: 09/05/2018   Years since quitting: 2.2  Smokeless Tobacco Never    Goals Met:  Independence with exercise equipment Exercise tolerated well No report of cardiac concerns or symptoms Strength training completed today  Goals Unmet:  Not Applicable  Comments: Pt able to follow exercise prescription today without complaint.  Will continue to monitor for progression.    Dr. Emily Filbert is Medical Director for [place].  Dr. Ottie Glazier is Medical Director for [facility] Pulmonary Rehabilitation.

## 2020-12-15 ENCOUNTER — Other Ambulatory Visit: Payer: Self-pay

## 2020-12-15 DIAGNOSIS — C3490 Malignant neoplasm of unspecified part of unspecified bronchus or lung: Secondary | ICD-10-CM

## 2020-12-15 NOTE — Progress Notes (Signed)
Daily Session Note  Patient Details  Name: Levi [name] MRN: [id number] Date of Birth: 06/13/1940 Referring Provider:   Flowsheet Row Pulmonary Rehab from 08/31/2020 in [facility] Cardiac and Pulmonary Rehab  Referring Provider [name] MD       Encounter Date: 12/15/2020  Check In:  Session Check In - 12/15/20 0924       Check-In   Supervising physician immediately available to respond to emergencies See telemetry face sheet for immediately available ER MD    Location [facility]-Cardiac & Pulmonary Rehab    Staff Present Birdie Sons, MPA, RN;Melissa Strayhorn, RDN, Rowe Pavy, BA, ACSM CEP, Exercise Physiologist    Virtual Visit No    Medication changes reported     No    Fall or balance concerns reported    No    Tobacco Cessation No Change    Warm-up and Cool-down Performed on first and last piece of equipment    Resistance Training Performed Yes    VAD Patient? No    PAD/SET Patient? No      Pain Assessment   Currently in Pain? No/denies                Social History   Tobacco Use  Smoking Status Former   Packs/day: 0.25   Years: 62.00   Pack years: 15.50   Types: Cigarettes   Quit date: 09/05/2018   Years since quitting: 2.2  Smokeless Tobacco Never    Goals Met:  Independence with exercise equipment Exercise tolerated well No report of cardiac concerns or symptoms Strength training completed today  Goals Unmet:  Not Applicable  Comments: Pt able to follow exercise prescription today without complaint.  Will continue to monitor for progression.    Dr. Emily Filbert is Medical Director for [place].  Dr. Ottie Glazier is Medical Director for [facility] Pulmonary Rehabilitation.

## 2020-12-17 ENCOUNTER — Other Ambulatory Visit: Payer: Self-pay

## 2020-12-17 DIAGNOSIS — C3490 Malignant neoplasm of unspecified part of unspecified bronchus or lung: Secondary | ICD-10-CM | POA: Diagnosis not present

## 2020-12-17 NOTE — Progress Notes (Signed)
Daily Session Note  Patient Details  Name: Levi [name] MRN: [id number] Date of Birth: 1940-10-28 Referring Provider:   Flowsheet Row Pulmonary Rehab from 08/31/2020 in [facility] Cardiac and Pulmonary Rehab  Referring Provider [name] MD       Encounter Date: 12/17/2020  Check In:  Session Check In - 12/17/20 0914       Check-In   Supervising physician immediately available to respond to emergencies See telemetry face sheet for immediately available ER MD    Location [facility]-Cardiac & Pulmonary Rehab    Staff Present Birdie Sons, MPA, Mauricia Area, BS, ACSM CEP, Exercise Physiologist;Joseph Tessie Fass, [place]    Virtual Visit No    Medication changes reported     No    Fall or balance concerns reported    No    Tobacco Cessation No Change    Warm-up and Cool-down Performed on first and last piece of equipment    Resistance Training Performed Yes    VAD Patient? No    PAD/SET Patient? No      Pain Assessment   Currently in Pain? No/denies                Social History   Tobacco Use  Smoking Status Former   Packs/day: 0.25   Years: 62.00   Pack years: 15.50   Types: Cigarettes   Quit date: 09/05/2018   Years since quitting: 2.2  Smokeless Tobacco Never    Goals Met:  Independence with exercise equipment Exercise tolerated well No report of cardiac concerns or symptoms Strength training completed today  Goals Unmet:  Not Applicable  Comments: Pt able to follow exercise prescription today without complaint.  Will continue to monitor for progression.    Dr. Emily Filbert is Medical Director for [place].  Dr. Ottie Glazier is Medical Director for [facility] Pulmonary Rehabilitation.

## 2020-12-22 ENCOUNTER — Other Ambulatory Visit: Payer: Self-pay

## 2020-12-22 DIAGNOSIS — C3490 Malignant neoplasm of unspecified part of unspecified bronchus or lung: Secondary | ICD-10-CM | POA: Diagnosis not present

## 2020-12-22 NOTE — Progress Notes (Signed)
Daily Session Note  Patient Details  Name: Levi [name] MRN: [id number] Date of Birth: 11-Jun-1940 Referring Provider:   Flowsheet Row Pulmonary Rehab from 08/31/2020 in [facility] Cardiac and Pulmonary Rehab  Referring Provider [name] MD       Encounter Date: 12/22/2020  Check In:  Session Check In - 12/22/20 0918       Check-In   Supervising physician immediately available to respond to emergencies See telemetry face sheet for immediately available ER MD    Location [facility]-Cardiac & Pulmonary Rehab    Staff Present Birdie Sons, MPA, RN;Amanda Oletta Darter, BA, ACSM CEP, Exercise Physiologist;Jessica Luan Pulling, MA, RCEP, CCRP, CCET    Virtual Visit No    Medication changes reported     No    Fall or balance concerns reported    No    Tobacco Cessation No Change    Warm-up and Cool-down Performed on first and last piece of equipment    Resistance Training Performed Yes    VAD Patient? No    PAD/SET Patient? No      Pain Assessment   Currently in Pain? No/denies                Social History   Tobacco Use  Smoking Status Former   Packs/day: 0.25   Years: 62.00   Pack years: 15.50   Types: Cigarettes   Quit date: 09/05/2018   Years since quitting: 2.2  Smokeless Tobacco Never    Goals Met:  Independence with exercise equipment Exercise tolerated well No report of cardiac concerns or symptoms Strength training completed today  Goals Unmet:  Not Applicable  Comments: Pt able to follow exercise prescription today without complaint.  Will continue to monitor for progression.    Dr. Emily Filbert is Medical Director for [place].  Dr. Ottie Glazier is Medical Director for [facility] Pulmonary Rehabilitation.

## 2020-12-24 ENCOUNTER — Other Ambulatory Visit: Payer: Self-pay

## 2020-12-24 DIAGNOSIS — C3490 Malignant neoplasm of unspecified part of unspecified bronchus or lung: Secondary | ICD-10-CM | POA: Diagnosis not present

## 2020-12-24 NOTE — Progress Notes (Signed)
Daily Session Note  Patient Details  Name: Levi [name] MRN: [id number] Date of Birth: 17-Mar-1941 Referring Provider:   Flowsheet Row Pulmonary Rehab from 08/31/2020 in [facility] Cardiac and Pulmonary Rehab  Referring Provider [name] MD       Encounter Date: 12/24/2020  Check In:  Session Check In - 12/24/20 0918       Check-In   Supervising physician immediately available to respond to emergencies See telemetry face sheet for immediately available ER MD    Location [facility]Cardiac & Pulmonary Rehab    Staff Present Birdie Sons, MPA, Mauricia Area, BS, ACSM CEP, Exercise Physiologist;Amanda Oletta Darter, BA, ACSM CEP, Exercise Physiologist    Virtual Visit No    Medication changes reported     No    Fall or balance concerns reported    No    Tobacco Cessation No Change    Warm-up and Cool-down Performed on first and last piece of equipment    Resistance Training Performed Yes    VAD Patient? No    PAD/SET Patient? No      Pain Assessment   Currently in Pain? No/denies                Social History   Tobacco Use  Smoking Status Former   Packs/day: 0.25   Years: 62.00   Pack years: 15.50   Types: Cigarettes   Quit date: 09/05/2018   Years since quitting: 2.3  Smokeless Tobacco Never    Goals Met:  Independence with exercise equipment Exercise tolerated well No report of cardiac concerns or symptoms Strength training completed today  Goals Unmet:  Not Applicable  Comments: Pt able to follow exercise prescription today without complaint.  Will continue to monitor for progression.    Dr. Emily Filbert is Medical Director for [place].  Dr. Ottie Glazier is Medical Director for [facility] Pulmonary Rehabilitation.

## 2020-12-29 ENCOUNTER — Other Ambulatory Visit: Payer: Self-pay

## 2020-12-29 DIAGNOSIS — C3490 Malignant neoplasm of unspecified part of unspecified bronchus or lung: Secondary | ICD-10-CM

## 2020-12-29 NOTE — Progress Notes (Signed)
Daily Session Note  Patient Details  Name: Levi [name] MRN: [id number] Date of Birth: 08-09-1940 Referring Provider:   Flowsheet Row Pulmonary Rehab from 08/31/2020 in [facility] Cardiac and Pulmonary Rehab  Referring Provider [name] MD       Encounter Date: 12/29/2020  Check In:  Session Check In - 12/29/20 0924       Check-In   Supervising physician immediately available to respond to emergencies See telemetry face sheet for immediately available ER MD    Location [facility]-Cardiac & Pulmonary Rehab    Staff Present Birdie Sons, MPA, RN;Amanda Oletta Darter, BA, ACSM CEP, Exercise Physiologist;Jessica Luan Pulling, MA, RCEP, CCRP, CCET    Virtual Visit No    Medication changes reported     No    Fall or balance concerns reported    No    Tobacco Cessation No Change    Warm-up and Cool-down Performed on first and last piece of equipment    Resistance Training Performed Yes    VAD Patient? No    PAD/SET Patient? No      Pain Assessment   Currently in Pain? No/denies                Social History   Tobacco Use  Smoking Status Former   Packs/day: 0.25   Years: 62.00   Pack years: 15.50   Types: Cigarettes   Quit date: 09/05/2018   Years since quitting: 2.3  Smokeless Tobacco Never    Goals Met:  Independence with exercise equipment Exercise tolerated well No report of concerns or symptoms today Strength training completed today  Goals Unmet:  Not Applicable  Comments: Pt able to follow exercise prescription today without complaint.  Will continue to monitor for progression.    Dr. Emily Filbert is Medical Director for [place].  Dr. Ottie Glazier is Medical Director for [facility] Pulmonary Rehabilitation.

## 2020-12-31 ENCOUNTER — Encounter: Payer: No Typology Code available for payment source | Attending: Internal Medicine | Admitting: *Deleted

## 2020-12-31 ENCOUNTER — Other Ambulatory Visit: Payer: Self-pay

## 2020-12-31 DIAGNOSIS — C3490 Malignant neoplasm of unspecified part of unspecified bronchus or lung: Secondary | ICD-10-CM | POA: Insufficient documentation

## 2020-12-31 NOTE — Progress Notes (Signed)
Daily Session Note  Patient Details  Name: Levi [name] MRN: [id number] Date of Birth: [birth year] Referring Provider:   Flowsheet Row Pulmonary Rehab from 08/31/2020 in [facility] Cardiac and Pulmonary Rehab  Referring Provider [name] MD       Encounter Date: 12/31/2020  Check In:  Session Check In - 12/31/20 0923       Check-In   Supervising physician immediately available to respond to emergencies See telemetry face sheet for immediately available ER MD    Location [facility]-Cardiac & Pulmonary Rehab    Staff Present Hope Budds, RDN, LDN;Jarod Bozzo Luan Pulling, MA, RCEP, CCRP, CCET;Kelly Bollinger, MPA, Mauricia Area, BS, ACSM CEP, Exercise Physiologist;Joseph Eldred, [place]    Virtual Visit No    Medication changes reported     No    Fall or balance concerns reported    No    Warm-up and Cool-down Performed on first and last piece of equipment    Resistance Training Performed Yes    VAD Patient? No    PAD/SET Patient? No      Pain Assessment   Currently in Pain? No/denies                Social History   Tobacco Use  Smoking Status Former   Packs/day: 0.25   Years: 62.00   Pack years: 15.50   Types: Cigarettes   Quit date: 09/05/2018   Years since quitting: 2.3  Smokeless Tobacco Never    Goals Met:  Proper associated with RPD/PD & O2 Sat Independence with exercise equipment Exercise tolerated well No report of concerns or symptoms today Strength training completed today  Goals Unmet:  Not Applicable  Comments: Pt able to follow exercise prescription today without complaint.  Will continue to monitor for progression.    Dr. Emily Filbert is Medical Director for [place].  Dr. Ottie Glazier is Medical Director for [facility] Pulmonary Rehabilitation.

## 2021-01-05 ENCOUNTER — Other Ambulatory Visit: Payer: Self-pay

## 2021-01-05 DIAGNOSIS — C3490 Malignant neoplasm of unspecified part of unspecified bronchus or lung: Secondary | ICD-10-CM | POA: Diagnosis not present

## 2021-01-05 NOTE — Progress Notes (Signed)
Daily Session Note  Patient Details  Name: Levi [name] MRN: [id number] Date of Birth: December 18, 1940 Referring Provider:   Flowsheet Row Pulmonary Rehab from 08/31/2020 in [facility] Cardiac and Pulmonary Rehab  Referring Provider [name] MD       Encounter Date: 01/05/2021  Check In:  Session Check In - 01/05/21 0921       Check-In   Supervising physician immediately available to respond to emergencies See telemetry face sheet for immediately available ER MD    Location [facility]-Cardiac & Pulmonary Rehab    Staff Present Birdie Sons, MPA, RN;Laureen Owens Shark, BS, RRT, CPFT;Amanda Sommer, BA, ACSM CEP, Exercise Physiologist    Virtual Visit No    Medication changes reported     No    Fall or balance concerns reported    No    Tobacco Cessation No Change    Warm-up and Cool-down Performed on first and last piece of equipment    Resistance Training Performed Yes    VAD Patient? No    PAD/SET Patient? No      Pain Assessment   Currently in Pain? No/denies                Social History   Tobacco Use  Smoking Status Former   Packs/day: 0.25   Years: 62.00   Pack years: 15.50   Types: Cigarettes   Quit date: 09/05/2018   Years since quitting: 2.3  Smokeless Tobacco Never    Goals Met:  Independence with exercise equipment Exercise tolerated well No report of concerns or symptoms today Strength training completed today  Goals Unmet:  Not Applicable  Comments: Pt able to follow exercise prescription today without complaint.  Will continue to monitor for progression.    Dr. Emily Filbert is Medical Director for [place].  Dr. Ottie Glazier is Medical Director for [facility] Pulmonary Rehabilitation.

## 2021-01-06 ENCOUNTER — Encounter: Payer: Self-pay | Admitting: *Deleted

## 2021-01-06 DIAGNOSIS — C3490 Malignant neoplasm of unspecified part of unspecified bronchus or lung: Secondary | ICD-10-CM

## 2021-01-06 NOTE — Progress Notes (Signed)
Pulmonary Individual Treatment Plan  Patient Details  Name: Levi [name] MRN: [id number] Date of Birth: Feb 04, 1941 Referring Provider:   Flowsheet Row Pulmonary Rehab from 08/31/2020 in [facility] Cardiac and Pulmonary Rehab  Referring Provider [name] MD       Initial Encounter Date:  Flowsheet Row Pulmonary Rehab from 08/31/2020 in [facility] Cardiac and Pulmonary Rehab  Date 08/31/20       Visit Diagnosis: Malignant neoplasm of lung, unspecified laterality, unspecified part of lung ([place])  Patient's Home Medications on Admission:  Current Outpatient Medications:    albuterol (PROVENTIL HFA;VENTOLIN HFA) 108 (90 Base) MCG/ACT inhaler, Inhale 2 puffs into the lungs every 6 (six) hours as needed for wheezing or shortness of breath., Disp: 1 Inhaler, Rfl: 0   apixaban (ELIQUIS) 5 MG TABS tablet, Take 1 tablet (5 mg total) by mouth 2 (two) times daily., Disp: 180 tablet, Rfl: 3   budesonide-formoterol (SYMBICORT) 160-4.5 MCG/ACT inhaler, Inhale 2 puffs into the lungs 2 (two) times daily., Disp: , Rfl:    diltiazem (DILACOR XR) 120 MG 24 hr capsule, Take 120 mg by mouth daily., Disp: , Rfl:    levothyroxine (SYNTHROID) 137 MCG tablet, Take 137 mcg by mouth daily before breakfast., Disp: , Rfl:    tamsulosin (FLOMAX) 0.4 MG CAPS capsule, Take 0.4 mg by mouth daily. 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[place])    Prostate enlargement    Shortness of breath    Squamous cell carcinoma of lung, left ([facility])    Thyroid disease    Tobacco abuse     Tobacco  Use: Social History   Tobacco Use  Smoking Status Former   Packs/day: 0.25   Years: 62.00   Pack years: 15.50   Types: Cigarettes   Quit date: 09/05/2018   Years since quitting: 2.3  Smokeless Tobacco Never    Labs: Recent Review Scientist, physiological     Labs for ITP Cardiac and Pulmonary Rehab Latest Ref Rng & Units 10/15/2018   PHART 7.350 - 7.450 7.42   PCO2ART 32.0 - 48.0 mmHg 35   HCO3 20.0 - 28.0 mmol/L 22.7   ACIDBASEDEF 0.0 - 2.0 mmol/L 1.3   O2SAT % 93.1        Pulmonary Assessment Scores:  Pulmonary Assessment Scores     Row Name 08/31/20 1237         ADL UCSD   ADL Phase Entry     SOB Score total 53     Rest 0     Walk 2     Stairs 4     Bath 3     Dress 3     Shop 2           CAT Score   CAT Score 26           mMRC Score   mMRC Score 3              UCSD: Self-administered rating of  dyspnea associated with activities of daily living (ADLs) 6-point scale (0 = "not at all" to 5 = "maximal or unable to do because of breathlessness")  Scoring Scores range from 0 to 120.  Minimally important difference is 5 units  CAT: CAT can identify the health impairment of COPD patients and is better correlated with disease progression.  CAT has a scoring range of zero to 40. The CAT score is classified into four groups of low (less than 10), medium (10 - 20), high (21-30) and very high (31-40) based on the impact level of disease on health status. A CAT score over 10 suggests significant symptoms.  A worsening CAT score could be explained by an exacerbation, poor medication adherence, poor inhaler technique, or progression of COPD or comorbid conditions.  CAT MCID is 2 points  mMRC: mMRC (Modified Medical Research Council) Dyspnea Scale is used to assess the degree of baseline functional disability in patients of respiratory disease due to dyspnea. No minimal important difference is established. A decrease in score of 1 point or greater is considered a positive  change.   Pulmonary Function Assessment:   Exercise Target Goals: Exercise Program Goal: Individual exercise prescription set using results from initial 6 min walk test and THRR while considering  patient's activity barriers and safety.   Exercise Prescription Goal: Initial exercise prescription builds to 30-45 minutes a day of aerobic activity, 2-3 days per week.  Home exercise guidelines will be given to patient during program as part of exercise prescription that the participant will acknowledge.  Education: Aerobic Exercise: - Group verbal and visual presentation on the components of exercise prescription. Introduces F.I.T.T principle from ACSM for exercise prescriptions.  Reviews F.I.T.T. principles of aerobic exercise including progression. Written material given at graduation.   Education: Resistance Exercise: - Group verbal and visual presentation on the components of exercise prescription. Introduces F.I.T.T principle from ACSM for exercise prescriptions  Reviews F.I.T.T. principles of resistance exercise including progression. Written material given at graduation.    Education: Exercise & Equipment Safety: - Individual verbal instruction and demonstration of equipment use and safety with use of the equipment. Flowsheet Row Pulmonary Rehab from 10/08/2020 in [facility] Cardiac and Pulmonary Rehab  Education need identified 08/31/20  Date 08/31/20  Educator [place]  Instruction Review Code 1- Verbalizes Understanding       Education: Exercise Physiology & General Exercise Guidelines: - Group verbal and written instruction with models to review the exercise physiology of the cardiovascular system and associated critical values. Provides general exercise guidelines with specific guidelines to those with heart or lung disease.    Education: Flexibility, Balance, Mind/Body Relaxation: - Group verbal and visual presentation with interactive activity on the components of exercise  prescription. Introduces F.I.T.T principle from ACSM for exercise prescriptions. Reviews F.I.T.T. principles of flexibility and balance exercise training including progression. Also discusses the mind body connection.  Reviews various relaxation techniques to help reduce and manage stress (i.e. Deep breathing, progressive muscle relaxation, and visualization). Balance handout provided to take home. Written material given at graduation. Flowsheet Row Pulmonary Rehab from 10/08/2020 in [facility] Cardiac and Pulmonary Rehab  Date 09/03/20  Educator [place]  Instruction Review Code 1- Verbalizes Understanding       Activity Barriers & Risk Stratification:  Activity Barriers & Cardiac Risk Stratification - 08/31/20 1234       Activity Barriers & Cardiac Risk Stratification   Activity Barriers Shortness of Breath;Deconditioning;Muscular Weakness;Other (comment)    Comments Sciatica  6 Minute Walk:  6 Minute Walk     Row Name 08/31/20 1240         6 Minute Walk   Phase Initial     Distance 780 feet     Walk Time 6 minutes     # of Rest Breaks 0     MPH 1.47     METS 1.08     RPE 13     Perceived Dyspnea  1     VO2 Peak 3.81     Symptoms Yes (comment)     Comments Lower back pain 7/10     Resting HR 82 bpm     Resting BP 132/78     Resting Oxygen Saturation  94 %     Exercise Oxygen Saturation  during 6 min walk 86 %     Max Ex. HR 101 bpm     Max Ex. BP 144/76     2 Minute Post BP 124/78           Interval HR   1 Minute HR 93     2 Minute HR 97     3 Minute HR 98     4 Minute HR 99     5 Minute HR 101     6 Minute HR 100     2 Minute Post HR 87     Interval Heart Rate? Yes           Interval Oxygen   Interval Oxygen? Yes     Baseline Oxygen Saturation % 94 %     1 Minute Oxygen Saturation % 90 %     1 Minute Liters of Oxygen 0 L  RA     2 Minute Oxygen Saturation % 87 %     2 Minute Liters of Oxygen 0 L     3 Minute Oxygen Saturation % 86 %     3 Minute  Liters of Oxygen 0 L     4 Minute Oxygen Saturation % 87 %     4 Minute Liters of Oxygen 0 L     5 Minute Oxygen Saturation % 86 %     5 Minute Liters of Oxygen 0 L     6 Minute Oxygen Saturation % 88 %     6 Minute Liters of Oxygen 0 L     2 Minute Post Oxygen Saturation % 94 %     2 Minute Post Liters of Oxygen 0 L             Oxygen Initial Assessment:  Oxygen Initial Assessment - 08/31/20 1237       Home Oxygen   Home Oxygen Device None    Sleep Oxygen Prescription None    Home Exercise Oxygen Prescription None    Home Resting Oxygen Prescription None      Initial 6 min Walk   Oxygen Used None      Program Oxygen Prescription   Program Oxygen Prescription None      Intervention   Short Term Goals To learn and understand importance of monitoring SPO2 with pulse oximeter and demonstrate accurate use of the pulse oximeter.;To learn and understand importance of maintaining oxygen saturations>88%;To learn and demonstrate proper pursed lip breathing techniques or other breathing techniques. ;To learn and demonstrate proper use of respiratory medications    Long  Term Goals Verbalizes importance of monitoring SPO2 with pulse oximeter and return demonstration;Maintenance of O2 saturations>88%;Exhibits proper breathing techniques, such  as pursed lip breathing or other method taught during program session;Compliance with respiratory medication;Demonstrates proper use of MDI's             Oxygen Re-Evaluation:  Oxygen Re-Evaluation     Row Name 09/03/20 0950 11/05/20 0932 12/24/20 1004         Program Oxygen Prescription   Program Oxygen Prescription None None None           Home Oxygen   Home Oxygen Device None None None     Sleep Oxygen Prescription None None None     Home Exercise Oxygen Prescription None None None     Home Resting Oxygen Prescription None None None           Goals/Expected Outcomes   Short Term Goals To learn and understand importance of  monitoring SPO2 with pulse oximeter and demonstrate accurate use of the pulse oximeter.;To learn and understand importance of maintaining oxygen saturations>88%;To learn and demonstrate proper pursed lip breathing techniques or other breathing techniques.  To learn and understand importance of monitoring SPO2 with pulse oximeter and demonstrate accurate use of the pulse oximeter.;To learn and understand importance of maintaining oxygen saturations>88%;To learn and demonstrate proper pursed lip breathing techniques or other breathing techniques.  To learn and understand importance of monitoring SPO2 with pulse oximeter and demonstrate accurate use of the pulse oximeter.;To learn and understand importance of maintaining oxygen saturations>88%;To learn and demonstrate proper pursed lip breathing techniques or other breathing techniques. ;To learn and demonstrate proper use of respiratory medications     Long  Term Goals Verbalizes importance of monitoring SPO2 with pulse oximeter and return demonstration;Maintenance of O2 saturations>88%;Exhibits proper breathing techniques, such as pursed lip breathing or other method taught during program session;Exhibits compliance with exercise, home  and travel O2 prescription Verbalizes importance of monitoring SPO2 with pulse oximeter and return demonstration;Maintenance of O2 saturations>88%;Exhibits proper breathing techniques, such as pursed lip breathing or other method taught during program session;Exhibits compliance with exercise, home  and travel O2 prescription Verbalizes importance of monitoring SPO2 with pulse oximeter and return demonstration;Maintenance of O2 saturations>88%;Exhibits proper breathing techniques, such as pursed lip breathing or other method taught during program session;Exhibits compliance with exercise, home  and travel O2 prescription;Compliance with respiratory medication;Demonstrates proper use of MDI's     Comments Reviewed PLB technique with  pt.  Talked about how it works and it's importance in maintaining their exercise saturations. He continues to practice PLB and he notices his O2 will come back up. When exercising his O2 is about 89-93 at home. He feels that his breathing has improved overall and now he can do more things at home (ADLs). He still has good and bad days. Levi [name] is doing well with his PLB.  He does find it useful when feeling SOB.  He is doing well with his inhaler and was given a spacer today to help.  Also talked about using nebulizer more frequently.     Goals/Expected Outcomes Short: Become more profiecient at using PLB.   Long: Become independent at using PLB. Short: Become more profiecient at using PLB, continue to exercise at home  Long: Become independent at using PLB. Short: Use spacer and nebulizer Long: Continue to use PLB              Oxygen Discharge (Final Oxygen Re-Evaluation):  Oxygen Re-Evaluation - 12/24/20 1004       Program Oxygen Prescription   Program Oxygen Prescription None  Home Oxygen   Home Oxygen Device None    Sleep Oxygen Prescription None    Home Exercise Oxygen Prescription None    Home Resting Oxygen Prescription None      Goals/Expected Outcomes   Short Term Goals To learn and understand importance of monitoring SPO2 with pulse oximeter and demonstrate accurate use of the pulse oximeter.;To learn and understand importance of maintaining oxygen saturations>88%;To learn and demonstrate proper pursed lip breathing techniques or other breathing techniques. ;To learn and demonstrate proper use of respiratory medications    Long  Term Goals Verbalizes importance of monitoring SPO2 with pulse oximeter and return demonstration;Maintenance of O2 saturations>88%;Exhibits proper breathing techniques, such as pursed lip breathing or other method taught during program session;Exhibits compliance with exercise, home  and travel O2 prescription;Compliance with respiratory  medication;Demonstrates proper use of MDI's    Comments Levi [name] is doing well with his PLB.  He does find it useful when feeling SOB.  He is doing well with his inhaler and was given a spacer today to help.  Also talked about using nebulizer more frequently.    Goals/Expected Outcomes Short: Use spacer and nebulizer Long: Continue to use PLB             Initial Exercise Prescription:  Initial Exercise Prescription - 08/31/20 1200       Date of Initial Exercise RX and Referring Provider   Date 08/31/20    Referring Provider [name] MD      Recumbant Bike   Level 1    RPM 60    Watts 10    Minutes 15    METs 1      NuStep   Level 1    SPM 80    Minutes 15    METs 1      Track   Laps 10    Minutes 15    METs 1      Prescription Details   Frequency (times per week) 2    Duration Progress to 30 minutes of continuous aerobic without signs/symptoms of physical distress      Intensity   THRR 40-80% of Max Heartrate 106-130    Ratings of Perceived Exertion 11-13    Perceived Dyspnea 0-4      Progression   Progression Continue to progress workloads to maintain intensity without signs/symptoms of physical distress.      Resistance Training   Training Prescription Yes    Weight 3 lb    Reps 10-15             Perform Capillary Blood Glucose checks as needed.  Exercise Prescription Changes:   Exercise Prescription Changes     Row Name 08/31/20 1200 09/08/20 1300 09/23/20 1400 10/06/20 1400 10/20/20 1500     Response to Exercise   Blood Pressure (Admit) 132/78 128/82 128/76 146/80 126/76   Blood Pressure (Exercise) 144/76 146/64 142/78 130/76 --   Blood Pressure (Exit) 124/78 144/74 128/76 116/70 126/74   Heart Rate (Admit) 82 bpm 88 bpm 76 bpm 87 bpm 73 bpm   Heart Rate (Exercise) 101 bpm 97 bpm 95 bpm 96 bpm 99 bpm   Heart Rate (Exit) 87 bpm 88 bpm 86 bpm 94 bpm 92 bpm   Oxygen Saturation (Admit) 94 % 90 % 92 % 90 % 93 %   Oxygen Saturation  (Exercise) 86 % 89 % 89 % 91 % 91 %   Oxygen Saturation (Exit) 94 % 92 % 93 % 93 % 92 %  Rating of Perceived Exertion (Exercise) 13 13 13 13 13    Perceived Dyspnea (Exercise) 1 3 2 2 2    Symptoms Back pain 7/10, SOB none none none SOB   Comments walk test results -- -- -- --   Duration -- Progress to 30 minutes of  aerobic without signs/symptoms of physical distress Continue with 30 min of aerobic exercise without signs/symptoms of physical distress. Continue with 30 min of aerobic exercise without signs/symptoms of physical distress. Continue with 30 min of aerobic exercise without signs/symptoms of physical distress.   Intensity -- THRR unchanged THRR unchanged THRR unchanged THRR unchanged     Progression   Progression -- Continue to progress workloads to maintain intensity without signs/symptoms of physical distress. Continue to progress workloads to maintain intensity without signs/symptoms of physical distress. Continue to progress workloads to maintain intensity without signs/symptoms of physical distress. Continue to progress workloads to maintain intensity without signs/symptoms of physical distress.   Average METs -- 2 2.15 2.5 2.15     Resistance Training   Training Prescription -- Yes Yes Yes Yes   Weight -- 3 lb 3 lb 5 lb 5 lb   Reps -- 10-15 10-15 10-15 10-15     Interval Training   Interval Training -- -- No No No     NuStep   Level -- -- 3 4 4    Minutes -- -- 15 15 15    METs -- -- 2.3 2.5 2.2     Biostep-RELP   Level -- 2 -- -- --   Minutes -- 15 -- -- --   METs -- 2 -- -- --     Track   Laps -- 5 19 17 20    Minutes -- 15 15 15 15    METs -- -- 2 -- 2.1     Home Exercise Plan   Plans to continue exercise at -- -- -- -- [facility] (comment)  [employer]   Frequency -- -- -- -- Add 2 additional days to program exercise sessions.   Initial Home Exercises Provided -- -- -- -- 10/08/20    Row Name 11/05/20 0900 11/18/20 1500 12/17/20 0700 12/28/20 1300        Response to Exercise   Blood Pressure (Admit) 142/82 132/76 124/64 132/84    Blood Pressure (Exit) 134/74 126/62 122/74 122/82    Heart Rate (Admit) 73 bpm 82 bpm 77 bpm 85 bpm    Heart Rate (Exercise) 93 bpm 101 bpm 96 bpm 102 bpm    Heart Rate (Exit) 78 bpm 90 bpm 93 bpm 93 bpm    Oxygen Saturation (Admit) 91 % 89 % 91 % 89 %    Oxygen Saturation (Exercise) 88 % 90 % 88 % 87 %    Oxygen Saturation (Exit) 96 % 96 % 92 % 93 %    Rating of Perceived Exertion (Exercise) 13 13 13 13     Perceived Dyspnea (Exercise) 2 2 2 2     Symptoms SOB SOB SOB SOB    Duration Continue with 30 min of aerobic exercise without signs/symptoms of physical distress. Continue with 30 min of aerobic exercise without signs/symptoms of physical distress. Continue with 30 min of aerobic exercise without signs/symptoms of physical distress. Continue with 30 min of aerobic exercise without signs/symptoms of physical distress.    Intensity THRR unchanged THRR unchanged THRR unchanged THRR unchanged         Progression   Progression Continue to progress workloads to maintain intensity without signs/symptoms of physical  distress. Continue to progress workloads to maintain intensity without signs/symptoms of physical distress. Continue to progress workloads to maintain intensity without signs/symptoms of physical distress. Continue to progress workloads to maintain intensity without signs/symptoms of physical distress.    Average METs 2.05 1.99 2.03 2.1         Resistance Training   Training Prescription Yes Yes Yes Yes    Weight 5 lb 5 lb 5 lb 5 lb    Reps 10-15 10-15 10-15 10-15         Interval Training   Interval Training No No No No         NuStep   Level 4 4 4 4     Minutes 15 15 15 15     METs 2.1 2 2  1.9         Biostep-RELP   Level 4 -- -- --    Minutes 15 -- -- --    METs 2.2 -- -- --         Track   Laps 20 19 23 24     Minutes 2.14 15 15 15     METs 1.98 2.03 2.25 2.31         Home Exercise  Plan   Plans to continue exercise at [facility] (comment)  [place] (comment)  [place] (comment)  [place] (comment)  [employer]    Frequency Add 2 additional days to program exercise sessions. Add 2 additional days to program exercise sessions. Add 2 additional days to program exercise sessions. Add 2 additional days to program exercise sessions.    Initial Home Exercises Provided 10/08/20 10/08/20 10/08/20 10/08/20             Exercise Comments:   Exercise Comments     Row Name 09/03/20 0949 11/20/20 1329         Exercise Comments First full day of exercise!  Patient was oriented to gym and equipment including functions, settings, policies, and procedures.  Patient's individual exercise prescription and treatment plan were reviewed.  All starting workloads were established based on the results of the 6 minute walk test done at initial orientation visit.  The plan for exercise progression was also introduced and progression will be customized based on patient's performance and goals. Levi [name] has tested positive for COVID and will be out at least the next 2 weeks               Exercise Goals and Review:   Exercise Goals     Row Name 08/31/20 1254             Exercise Goals   Increase Physical Activity Yes       Intervention Provide advice, education, support and counseling about physical activity/exercise needs.;Develop an individualized exercise prescription for aerobic and resistive training based on initial evaluation findings, risk stratification, comorbidities and participant's personal goals.       Expected Outcomes Short Term: Attend rehab on a regular basis to increase amount of physical activity.;Long Term: Add in home exercise to make exercise part of routine and to increase amount of physical activity.;Long Term: Exercising regularly at least 3-5 days a week.       Increase Strength and Stamina Yes       Intervention  Provide advice, education, support and counseling about physical activity/exercise needs.;Develop an individualized exercise prescription for aerobic and resistive training based on initial evaluation findings, risk stratification, comorbidities and participant's personal goals.  Expected Outcomes Short Term: Increase workloads from initial exercise prescription for resistance, speed, and METs.;Short Term: Perform resistance training exercises routinely during rehab and add in resistance training at home;Long Term: Improve cardiorespiratory fitness, muscular endurance and strength as measured by increased METs and functional capacity (6MWT)       Able to understand and use rate of perceived exertion (RPE) scale Yes       Intervention Provide education and explanation on how to use RPE scale       Expected Outcomes Short Term: Able to use RPE daily in rehab to express subjective intensity level;Long Term:  Able to use RPE to guide intensity level when exercising independently       Able to understand and use Dyspnea scale Yes       Intervention Provide education and explanation on how to use Dyspnea scale       Expected Outcomes Short Term: Able to use Dyspnea scale daily in rehab to express subjective sense of shortness of breath during exertion;Long Term: Able to use Dyspnea scale to guide intensity level when exercising independently       Knowledge and understanding of Target Heart Rate Range (THRR) Yes       Intervention Provide education and explanation of THRR including how the numbers were predicted and where they are located for reference       Expected Outcomes Short Term: Able to state/look up THRR;Short Term: Able to use daily as guideline for intensity in rehab;Long Term: Able to use THRR to govern intensity when exercising independently       Able to check pulse independently Yes       Intervention Provide education and demonstration on how to check pulse in carotid and radial  arteries.;Review the importance of being able to check your own pulse for safety during independent exercise       Expected Outcomes Short Term: Able to explain why pulse checking is important during independent exercise;Long Term: Able to check pulse independently and accurately       Understanding of Exercise Prescription Yes       Intervention Provide education, explanation, and written materials on patient's individual exercise prescription       Expected Outcomes Short Term: Able to explain program exercise prescription;Long Term: Able to explain home exercise prescription to exercise independently                Exercise Goals Re-Evaluation :  Exercise Goals Re-Evaluation     Row Name 09/03/20 0949 09/23/20 1454 10/06/20 1424 10/08/20 0944 10/20/20 1547     Exercise Goal Re-Evaluation   Exercise Goals Review Increase Physical Activity;Able to understand and use rate of perceived exertion (RPE) scale;Knowledge and understanding of Target Heart Rate Range (THRR);Understanding of Exercise Prescription;Increase Strength and Stamina;Able to understand and use Dyspnea scale;Able to check pulse independently Increase Physical Activity;Increase Strength and Stamina;Understanding of Exercise Prescription Increase Physical Activity;Increase Strength and Stamina Increase Physical Activity;Increase Strength and Stamina Increase Physical Activity;Increase Strength and Stamina;Understanding of Exercise Prescription   Comments Reviewed RPE and dyspnea scales, THR and program prescription with pt today.  Pt voiced understanding and was given a copy of goals to take home. Krue is off to a good start in rehab.  He is up to 19 laps on the track. He did drop back to level 1 on the NuStep and we will try to get him back up to level 3.  We will continue to monitor his progress. Levi [name] has progressed  to level 4 on NS.  He has also increased to 5 lb for strength work.  We will monitor progress. Reviewed home  exercise with pt today.  [facility]  Reviewed THR, pulse, RPE, sign and symptoms, pulse oximetery and when to call 911 or MD.  Also discussed weather considerations and indoor options.  Pt voiced understanding. Levi [name] has been doing well in rehab.  He is up to level 4 on the NuStep.  We will continue to monitor his progress.   Expected Outcomes Short: Use RPE daily to regulate intensity. Long: Follow program prescription in THR. Short: Continue to attend rehab regularly Long: Conitnue to follow program prescription Short: reach 20 laps on track Long:  continue to build stamina Short:  work up to walking 30 min at home Long:  continue to build stamina Short: Continue to build up stamina on the track Levi [name] to improve stamina    Row Name 11/05/20 0908 11/05/20 0929 11/18/20 1524 12/08/20 0924 12/17/20 0755     Exercise Goal Re-Evaluation   Exercise Goals Review Increase Physical Activity;Increase Strength and Stamina;Understanding of Exercise Prescription Increase Physical Activity;Increase Strength and Stamina;Understanding of Exercise Prescription Increase Physical Activity;Increase Strength and Stamina;Understanding of Exercise Prescription Increase Physical Activity;Increase Strength and Stamina Increase Physical Activity;Increase Strength and Stamina   Comments Levi [name] has been progressing. O2 sats are maintaining above 88% and has increased his level on the Biostep to level 4. Will continue to monitor. Levi [name] reports not doing much exercise at home right now. He still plans on [facility] and he is walking (30-40 minutes/day - RPE 11) at home as well as some squats. He reports his O2 is 89-93 at home when exercising. Levi [name] is doing well in rehab.  He is on level 4 on the NuStep and will walk intermittenly.  We will continue to monitor his progress. Today is Levi [name] first day back.  Oxygen and shortness of breath ae the same as before he was out.  We wiill  monitor progress. Levi [name] is still getting back into the swing of things as he is returning from having COVID. He did increase his overall number of laps to 23 on the track. Will continue to monitor.   Expected Outcomes Short: Continue to increase number of laps on track Long: Continue to increase overall MET level ST: continue to attend rehab, join the Y LT: continue to increase MET level Short: Continue to try to walk more Long: Continue to improve stamina Short: get back to regular attendance Long: build overall stamina Short: Continue to increase number of laps on track as tolerated Long: Continue to increase overall MET level    Row Name 12/24/20 0953 12/28/20 1403           Exercise Goal Re-Evaluation   Exercise Goals Review Increase Physical Activity;Increase Strength and Stamina;Understanding of Exercise Prescription Increase Physical Activity;Increase Strength and Stamina      Comments Levi [name] is doing well in rehab.  He is using his Cubii at home on his off days.  He is also planning to get some weights at home.  He also feels stronger overall. Levi [name] is getting started back after being out with Covid.  He has done 24 laps on the track.  We will continue to monitor progress.      Expected Outcomes Short: Get handweights for home use Long: Continue to improve stamina. Short:  continue to build walking strength Long:  improve overall  MET level               Discharge Exercise Prescription (Final Exercise Prescription Changes):  Exercise Prescription Changes - 12/28/20 1300       Response to Exercise   Blood Pressure (Admit) 132/84    Blood Pressure (Exit) 122/82    Heart Rate (Admit) 85 bpm    Heart Rate (Exercise) 102 bpm    Heart Rate (Exit) 93 bpm    Oxygen Saturation (Admit) 89 %    Oxygen Saturation (Exercise) 87 %    Oxygen Saturation (Exit) 93 %    Rating of Perceived Exertion (Exercise) 13    Perceived Dyspnea (Exercise) 2    Symptoms SOB    Duration Continue with  30 min of aerobic exercise without signs/symptoms of physical distress.    Intensity THRR unchanged      Progression   Progression Continue to progress workloads to maintain intensity without signs/symptoms of physical distress.    Average METs 2.1      Resistance Training   Training Prescription Yes    Weight 5 lb    Reps 10-15      Interval Training   Interval Training No      NuStep   Level 4    Minutes 15    METs 1.9      Track   Laps 24    Minutes 15    METs 2.31      Home Exercise Plan   Plans to continue exercise at [employer] (comment)   [facility]   Frequency Add 2 additional days to program exercise sessions.    Initial Home Exercises Provided 10/08/20             Nutrition:  Target Goals: Understanding of nutrition guidelines, daily intake of sodium <1565m, cholesterol <2048m calories 30% from fat and 7% or less from saturated fats, daily to have 5 or more servings of fruits and vegetables.  Education: All About Nutrition: -Group instruction provided by verbal, written material, interactive activities, discussions, models, and posters to present general guidelines for heart healthy nutrition including fat, fiber, MyPlate, the role of sodium in heart healthy nutrition, utilization of the nutrition label, and utilization of this knowledge for meal planning. Follow up email sent as well. Written material given at graduation. [facility]  Date 09/10/20  [facility] Review Code 1- Verbalizes Understanding       Biometrics:  Pre Biometrics - 08/31/20 1233       Pre Biometrics   Height 5' 10.75" (1.797 m)    Weight 271 lb 9.6 oz (123.2 kg)    BMI (Calculated) 38.15    Single Leg Stand 8.16 seconds              Nutrition Therapy Plan and Nutrition Goals:  Nutrition Therapy & Goals - 09/17/20 1146       Nutrition Therapy   RD appointment deferred Yes   [place]un  does not want to meet with the RD at this time - will continue to check in.            Nutrition Assessments:  MEDIFICTS Score Key: ?70 Need to make dietary changes  40-70 Heart Healthy Diet ? 40 Therapeutic Level Cholesterol Diet  [facility]  Picture Your Plate Total Score on Admission 54      Picture Your Plate  Scores: <40 Unhealthy dietary pattern with much room for improvement. 41-50 Dietary pattern unlikely to meet recommendations for good health and room for improvement. 51-60 More healthful dietary pattern, with some room for improvement.  >60 Healthy dietary pattern, although there may be some specific behaviors that could be improved.   Nutrition Goals Re-Evaluation:  Nutrition Goals Re-Evaluation     Row Name 10/08/20 0933 11/05/20 0936 12/08/20 0929 12/24/20 1002       Goals   Nutrition Goal -- -- -- Healthy Eating    Comment Levi [name] has not met with RD yet He reports doing well with his nutrition and his wife keeps him on track. Continues to defer nutrition consult. Levi [name] has been out with covid - today is his first day back Levi [name] continues to decline a nutrtion appointment.  His wife does all the cooking and shopping and keeps a close eye on his diet.  He is working on portion control and feels hungry a lot.    Expected Outcome -- -- -- Continue to work on portion control and healthy eating.             Nutrition Goals Discharge (Final Nutrition Goals Re-Evaluation):  Nutrition Goals Re-Evaluation - 12/24/20 1002       Goals   Nutrition Goal Healthy Eating    Comment Levi [name] continues to decline a nutrtion appointment.  His wife does all the cooking and shopping and keeps a close eye on his diet.  He is working on portion control and feels hungry a lot.    Expected Outcome Continue to work on portion control and healthy eating.             Psychosocial: Target Goals:  Acknowledge presence or absence of significant depression and/or stress, maximize coping skills, provide positive support system. Participant is able to verbalize types and ability to use techniques and skills needed for reducing stress and depression.   Education: Stress, Anxiety, and Depression - Group verbal and visual presentation to define topics covered.  Reviews how body is impacted by stress, anxiety, and depression.  Also discusses healthy ways to reduce stress and to treat/manage anxiety and depression.  Written material given at graduation. Flowsheet Row Pulmonary Rehab from 10/08/2020 in [facility] Card[facility] and Pulmonary Rehab  Date 10/08/20  Educator [facility]  Instruction Review Code 1- [employer] Understanding       Education: Sleep Hygiene -Provides group verbal and written instruction about how sleep can affect your health.  Define sleep hygiene, discuss sleep cycles and impact of sleep habits. Review good sleep hygiene tips.    Initial Review & Psychosocial Screening:  Initial Psych Review & Screening - 08/28/20 1310       Initial Review   Current issues with Current Sleep Concerns;Current Stress Concerns   sleeps on and off all day; sleeps in bed from 3am-10am.   Source of Stress Concerns Unable to participate in former interests or hobbies;Unable to perform yard/household activities      [place]? Yes   wife   Concerns Inappropriate over/under dependence on family/friends      Barriers   Psychosocial barriers to participate in program There are no identifiable barriers or psychosocial needs.;The patient should benefit from training in stress management and relaxation.      Screening Interventions   Interventions Encouraged to exercise;Provide feedback about the scores to participant;To provide support and resources with identified psychosocial needs    Expected Outcomes Short Term goal: Utilizing psychosocial  counselor, staff and physician to assist  with identification of specific Stressors or current issues interfering with healing process. Setting desired goal for each stressor or current issue identified.;Long Term Goal: Stressors or current issues are controlled or eliminated.;Short Term goal: Identification and review with participant of any Quality of Life or Depression concerns found by scoring the questionnaire.;Long Term goal: The participant improves quality of Life and PHQ9 Scores as seen by post scores and/or verbalization of changes             Quality of Life Scores:  Scores of 19 and below usually indicate a poorer quality of life in these areas.  A difference of  2-3 points is a clinically meaningful difference.  A difference of 2-3 points in the total score of the Quality of Life Index has been associated with significant improvement in overall quality of life, self-image, physical symptoms, and general health in studies assessing change in quality of life.  PHQ-9: Recent Review Flowsheet Data     Depression screen [facility] 2/9 08/31/2020 10/20/2015 10/16/2015 01/07/2015 12/19/2014   Decreased Interest 1 0 0 0 0   Down, Depressed, Hopeless 0 0 0 0 0   PHQ - 2 Score 1 0 0 0 0   Altered sleeping 0 - - - -   Tired, decreased energy 1 - - - -   Change in appetite 0 - - - -   Feeling bad or failure about yourself  0 - - - -   Trouble concentrating 0 - - - -   Moving slowly or fidgety/restless 0 - - - -   Suicidal thoughts 0 - - - -   PHQ-9 Score 2 - - - -   Difficult doing work/chores Not difficult at all - - - -      Interpretation of Total Score  Total Score Depression Severity:  1-4 = Minimal depression, 5-9 = Mild depression, 10-14 = Moderate depression, 15-19 = Moderately severe depression, 20-27 = Severe depression   Psychosocial Evaluation and Intervention:  Psychosocial Evaluation - 10/08/20 0937       Psychosocial Evaluation & Interventions   Comments Levi [name] is getting up and around more.  He feels like he can  start mowing some on a riding mower.  We discussed heat concerns and not starting with too much time mowing etc.  He goes to sleep between 12 - 2 am and sleeps until 9 or 10 am.  He feels rested.    Expected Outcomes Short:  continue to exercise to build stamina Long: maintain good sleep habits             Psychosocial Re-Evaluation:  Psychosocial Re-Evaluation     Row Name 11/05/20 [phone] 12/08/20 0923 12/24/20 1003         Psychosocial Re-Evaluation   Current issues with None Identified None Identified None Identified     Comments He reports no stress at this time. He reports sleeping well when he goes to sleep, but will usually go to bed at 1am, he will wake up 7-9am (he feels well rested). He reports not having any hobbies that help him since breathing problems due to lack of energy - he used to fish and ran a ballfield. He leans on his wife for support. Baine reports no symptomsof anxiety or depression.  Sleep patterns are the same.  He can still rely on his wife for support Levi [name] is doing well.  He feels better since recovering from [place].  He  denies any major stressors and sleeps well.     Expected Outcomes ST: continue to attend rehab and notify staff of any changes LT: maintain positive attitude Short: continue to exercise to help with stress and sleep Long: maintain positive outlook Short: continue to exercise for mental boost Long: maintain positive outlook     Interventions Encouraged to attend Pulmonary Rehabilitation for the exercise -- Encouraged to attend Pulmonary Rehabilitation for the exercise     Continue Psychosocial Services  Follow up required by staff -- Follow up required by staff              Psychosocial Discharge (Final Psychosocial Re-Evaluation):  Psychosocial Re-Evaluation - 12/24/20 1003       Psychosocial Re-Evaluation   Current issues with None Identified    Comments Levi [name] is doing well.  He feels better since recovering from [place].  He denies  any major stressors and sleeps well.    Expected Outcomes Short: continue to exercise for mental boost Long: maintain positive outlook    Interventions Encouraged to attend Pulmonary Rehabilitation for the exercise    Continue Psychosocial Services  Follow up required by staff             Education: Education Goals: Education classes will be provided on a weekly basis, covering required topics. Participant will state understanding/return demonstration of topics presented.  Learning Barriers/Preferences:  Learning Barriers/Preferences - 08/28/20 1305       Learning Barriers/Preferences   Learning Barriers Hearing    Learning Preferences None             General Pulmonary Education Topics:  Infection Prevention: - Provides verbal and written material to individual with discussion of infection control including proper hand washing and proper equipment cleaning during exercise session. Flowsheet Row Pulmonary Rehab from 10/08/2020 in [facility] and Pulmonary Rehab  Education need identified 08/31/20  Date 08/31/20  Educator [place]  Instruction Review Code 1- Verbalizes Understanding       Falls Prevention: - Provides verbal and written material to individual with discussion of falls prevention and safety. Flowsheet Row Pulmonary Rehab from 10/08/2020 in [facility] Cardiac and Pulmonary Rehab  Education need identified 08/31/20  Date 08/31/20  Educator [place]  Instruction Review Code 1- Verbalizes Understanding       Chronic Lung Disease Review: - Group verbal instruction with posters, models, PowerPoint presentations and videos,  to review new updates, new respiratory medications, new advancements in procedures and treatments. Providing information on websites and "800" numbers for continued self-education. Includes information about supplement oxygen, available portable oxygen systems, continuous and intermittent flow rates, oxygen safety, concentrators, and Medicare reimbursement  for oxygen. Explanation of Pulmonary Drugs, including class, frequency, complications, importance of spacers, rinsing mouth after steroid MDI's, and proper cleaning methods for nebulizers. Review of basic lung anatomy and physiology related to function, structure, and complications of lung disease. Review of risk factors. Discussion about methods for diagnosing sleep apnea and types of masks and machines for OSA. Includes a review of the use of types of environmental controls: home humidity, furnaces, filters, dust mite/pet prevention, HEPA vacuums. Discussion about weather changes, air quality and the benefits of nasal washing. Instruction on Warning signs, infection symptoms, calling MD promptly, preventive modes, and value of vaccinations. Review of effective airway clearance, coughing and/or vibration techniques. Emphasizing that all should Create an Action Plan. Written material given at graduation. Flowsheet Row Pulmonary Rehab from 10/08/2020 in [facility] and Pulmonary Rehab  Date 10/01/20  Educator [facility]  Instruction Review Code 1- Verbalizes Understanding       AED/CPR: - Group verbal and written instruction with the use of models to demonstrate the basic use of the AED with the basic ABC's of resuscitation.    Anatomy and Cardiac Procedures: - Group verbal and visual presentation and models provide information about basic cardiac anatomy and function. Reviews the testing methods done to diagnose heart disease and the outcomes of the test results. Describes the treatment choices: Medical Management, Angioplasty, or Coronary Bypass Surgery for treating various heart conditions including Myocardial Infarction, Angina, Valve Disease, and Cardiac Arrhythmias.  Written material given at graduation.   Medication Safety: - Group verbal and visual instruction to review commonly prescribed medications for heart and lung disease. Reviews the medication, class of the drug, and side effects. Includes  the steps to properly store meds and maintain the prescription regimen.  Written material given at graduation. Flowsheet Row Pulmonary Rehab from 10/08/2020 in [facility] Cardiac and Pulmonary Rehab  Date 09/17/20  Educator SB  Instruction Review Code 1- Verbalizes Understanding       Other: -Provides group and verbal instruction on various topics (see comments)   Knowledge Questionnaire Score:  Knowledge Questionnaire Score - 08/31/20 1235       Knowledge Questionnaire Score   Pre Score 16/18: Oxygen, Lung disease              Core Components/Risk Factors/Patient Goals at Admission:  Personal Goals and Risk Factors at Admission - 08/31/20 1255       Core Components/Risk Factors/Patient Goals on Admission    Weight Management Yes;Weight Loss    Intervention Weight Management: Develop a combined nutrition and exercise program designed to reach desired caloric intake, while maintaining appropriate intake of nutrient and fiber, sodium and fats, and appropriate energy expenditure required for the weight goal.;Weight Management: Provide education and appropriate resources to help participant work on and attain dietary goals.;Weight Management/Obesity: Establish reasonable short term and long term weight goals.    Admit Weight 271 lb (122.9 kg)    Goal Weight: Short Term 265 lb (120.2 kg)    Goal Weight: Long Term 250 lb (113.4 kg)    Expected Outcomes Short Term: Continue to assess and modify interventions until short term weight is achieved;Long Term: Adherence to nutrition and physical activity/exercise program aimed toward attainment of established weight goal;Weight Loss: Understanding of general recommendations for a balanced deficit meal plan, which promotes 1-2 lb weight loss per week and includes a negative energy balance of [phone] kcal/d;Understanding recommendations for meals to include 15-35% energy as protein, 25-35% energy from fat, 35-60% energy from carbohydrates, less than  250m of dietary cholesterol, 20-35 gm of total fiber daily;Understanding of distribution of calorie intake throughout the day with the consumption of 4-5 meals/snacks    Improve shortness of breath with ADL's Yes    Intervention Provide education, individualized exercise plan and daily activity instruction to help decrease symptoms of SOB with activities of daily living.    Expected Outcomes Short Term: Improve cardiorespiratory fitness to achieve a reduction of symptoms when performing ADLs;Long Term: Be able to perform more ADLs without symptoms or delay the onset of symptoms    Hypertension Yes    Intervention Provide education on lifestyle modifcations including regular physical activity/exercise, weight management, moderate sodium restriction and increased consumption of fresh fruit, vegetables, and low fat dairy, alcohol moderation, and smoking cessation.;Monitor prescription use compliance.    Expected Outcomes Short Term: Continued assessment and intervention until  BP is < 140/9m HG in hypertensive participants. < 130/830mHG in hypertensive participants with diabetes, heart failure or chronic kidney disease.;Long Term: Maintenance of blood pressure at goal levels.             Education:Diabetes - Individual verbal and written instruction to review signs/symptoms of diabetes, desired ranges of glucose level fasting, after meals and with exercise. Acknowledge that pre and post exercise glucose checks will be done for 3 sessions at entry of program.   Know Your Numbers and Heart Failure: - Group verbal and visual instruction to discuss disease risk factors for cardiac and pulmonary disease and treatment options.  Reviews associated critical values for Overweight/Obesity, Hypertension, Cholesterol, and Diabetes.  Discusses basics of heart failure: signs/symptoms and treatments.  Introduces Heart Failure Zone chart for action plan for heart failure.  Written material given at  graduation. Flowsheet [facility]  Date 09/24/20  Educator SB  Instruction Review Code 1- Verbalizes Understanding       Core Components/Risk Factors/Patient Goals Review:   Goals and Risk Factor Review     Row Name 10/08/20 [id number]/07/22 0934 12/08/20 0918 12/24/20 0955       Core Components/Risk Factors/Patient Goals Review   Personal Goals Review Weight Management/Obesity;Improve shortness of breath with ADL's Weight Management/Obesity;Improve shortness of breath with ADL's Weight Management/Obesity;Improve shortness of breath with ADL's Weight Management/Obesity;Improve shortness of breath with ADL's;Hypertension;Increase knowledge of respiratory medications and ability to use respiratory devices properly.    Review Levi [name] weight is down 3-4 lb since he started.  He is walking at home as well.  He feels like he can get out and mow the grass some now. (riding mower) He continues to practice PLB and he notices his O2 will come back up. When exercising his O2 is about 89-93 at home. He feels that his breathing has improved overall and now he can do more things at home (ADLs). He still has good and bad days. He reports his weight has been stable. He is taking all his medications as directed and tolerating them well. Eon has been out with Covid.  He feels his breathing is about the same.  Today is his first day back.  He is taking all meds as directed. Levi [name] doing well in rehab. His weight is staying steady. His blood pressures are holding steady and he does check them at home. He continues have a hard time breathing. He is good about using his inhalers.  He does not use his nebulizer daily.  We talked about adding nebulizer daily to help more with breathing.  I also gave him a spacer for his inhaler.    Expected Outcomes Short: continue to exercise consistently and monitor weight Long:  reach goal weight Short: continue to  exercise consistently and monitor weight Long: reach goal weight Short: get back to consistent attendance Long:  continue to work on weight Short: Start add in nebulizer and use spacer Long: Continue to work on [employer]             Core Components/Risk Factors/Patient Goals at Discharge (Final Review):   Goals and Risk Factor Review - 12/24/20 0955       Core Components/Risk Factors/Patient Goals Review   Personal Goals Review Weight Management/Obesity;Improve shortness of breath with ADL's;Hypertension;Increase knowledge of respiratory medications and ability to use respiratory devices properly.    Review Levi [name] doing well in rehab. His weight is staying  steady. His blood pressures are holding steady and he does check them at home. He continues have a hard time breathing. He is good about using his inhalers.  He does not use his nebulizer daily.  We talked about adding nebulizer daily to help more with breathing.  I also gave him a spacer for his inhaler.    Expected Outcomes Short: Start add in nebulizer and use spacer Long: Continue to work on weight loss             ITP Comments:  ITP Comments     Row Name 08/28/20 1320 08/31/20 1238 09/03/20 0938 09/16/20 0708 10/14/20 0805   ITP Comments Initial telephone orientation completed. Diagnosis can be found in [facility] 3/17. EP orientation scheduled for 5/2 at 10:30am. Completed 6MWT and gym orientation. Initial ITP created and sent for review to Dr. Emily Filbert, Medical Director. First full day of exercise!  Patient was oriented to gym and equipment including functions, settings, policies, and procedures.  Patient's individual exercise prescription and treatment plan were reviewed.  All starting workloads were established based on the results of the 6 minute walk test done at initial orientation visit.  The plan for exercise progression was also introduced and progression will be customized based on patient's performance and goals. 30  Day review completed. Medical Director ITP review done, changes made as directed, and signed approval by Medical Director.  New to program 30 Day review completed. Medical Director ITP review done, changes made as directed, and signed approval by Medical Director.    [place] Name 11/11/20 0847 11/20/20 1328 12/03/20 1136 12/09/20 0730 01/06/21 0629   ITP Comments 30 Day review completed. Medical Director ITP review done, changes made as directed, and signed approval by Medical Director. Artez has tested positive for COVID and will be out at least the next 2 weeks Arshan has not attended since last exercise review. 30 Day review completed. Medical Director ITP review done, changes made as directed, and signed approval by Medical Director. 30 Day review completed. Medical Director ITP review done, changes made as directed, and signed approval by Medical Director.            Comments:

## 2021-01-07 ENCOUNTER — Encounter: Payer: No Typology Code available for payment source | Admitting: *Deleted

## 2021-01-07 ENCOUNTER — Other Ambulatory Visit: Payer: Self-pay

## 2021-01-07 VITALS — Ht 70.75 in | Wt 272.7 lb

## 2021-01-07 DIAGNOSIS — C3490 Malignant neoplasm of unspecified part of unspecified bronchus or lung: Secondary | ICD-10-CM | POA: Diagnosis not present

## 2021-01-07 NOTE — Progress Notes (Signed)
Daily Session Note  Patient Details  Name: Levi [name] MRN: [id number] Date of Birth: 1941-02-09 Referring Provider:   Flowsheet Row Pulmonary Rehab from 08/31/2020 in [facility] Card[facility]monary Rehab  Referring Provider [name] MD       Encounter Date: 01/07/2021  Check In:  Session Check In - 01/07/21 0952       Check-In   Supervising physician immediately available to respond to emergencies See telemetry face sheet for immediately available ER MD    Location [facility]-Cardiac & Pulmonary Rehab    Staff Present Heath Lark, RN, BSN, CCRP;Laureen Owens Shark, BS, RRT, CPFT;Kelly Amedeo Plenty, BS, ACSM CEP, Exercise Physiologist    Virtual Visit No    Medication changes reported     No    Fall or balance concerns reported    No    Warm-up and Cool-down Performed on first and last piece of equipment    Resistance Training Performed Yes    VAD Patient? No    PAD/SET Patient? No      Pain Assessment   Currently in Pain? No/denies                Social History   Tobacco Use  Smoking Status Former   Packs/day: 0.25   Years: 62.00   Pack years: 15.50   Types: Cigarettes   Quit date: 09/05/2018   Years since quitting: 2.3  Smokeless Tobacco Never    Goals Met:  Proper associated with RPD/PD & O2 Sat Independence with exercise equipment Exercise tolerated well No report of concerns or symptoms today  Goals Unmet:  Not Applicable  Comments: Pt able to follow exercise prescription today without complaint.  Will continue to monitor for progression.   [place] Name 08/31/20 1240 01/07/21 0954       6 Minute Walk   Phase Initial Discharge    Distance 780 feet 985 feet    Distance % Change -- 26 %    Distance Feet Change -- 205 ft    Walk Time 6 minutes 6 minutes    # of Rest Breaks 0 0    MPH 1.47 1.87    METS 1.08 1.64    RPE 13 13    Perceived Dyspnea  1 2    VO2 Peak 3.81 5.73    Symptoms Yes (comment) No    Comments Lower back pain 7/10 Lower  back pain 6/10    Resting HR 82 bpm 88 bpm    Resting BP 132/78 120/70    Resting Oxygen Saturation  94 % 93 %    Exercise Oxygen Saturation  during 6 min walk 86 % 84 %    Max Ex. HR 101 bpm 115 bpm    Max Ex. BP 144/76 150/70    2 Minute Post BP 124/78 142/82         Interval HR   1 Minute HR 93 97    2 Minute HR 97 104    3 Minute HR 98 115    4 Minute HR 99 108    5 Minute HR 101 109    6 Minute HR 100 111    2 Minute Post HR 87 96    Interval Heart Rate? Yes Yes         Interval Oxygen   Interval Oxygen? Yes Yes    Baseline Oxygen Saturation % 94 % 93 %    1 Minute Oxygen Saturation % 90 %  89 %    1 Minute Liters of Oxygen 0 L  RA 0 L    2 Minute Oxygen Saturation % 87 % 86 %    2 Minute Liters of Oxygen 0 L 0 L    3 Minute Oxygen Saturation % 86 % 86 %    3 Minute Liters of Oxygen 0 L 0 L    4 Minute Oxygen Saturation % 87 % 85 %    4 Minute Liters of Oxygen 0 L 0 L    5 Minute Oxygen Saturation % 86 % 85 %    5 Minute Liters of Oxygen 0 L 0 L    6 Minute Oxygen Saturation % 88 % 84 %    6 Minute Liters of Oxygen 0 L 0 L    2 Minute Post Oxygen Saturation % 94 % 93 %    2 Minute Post Liters of Oxygen 0 L 0 L                 Dr. Emily Filbert is Medical Director for [place].  Dr. Ottie Glazier is Medical Director for [facility] Pulmonary Rehabilitation.

## 2021-01-11 NOTE — Patient Instructions (Signed)
Discharge Patient Instructions  Patient Details  Name: Levi [name] MRN: [id number] Date of Birth: 1940/11/12 Referring Provider:  [name], MD   Number of Visits: 36  Reason for Discharge:  Patient reached a stable level of exercise. Patient independent in their exercise.  Smoking History:  Social History   Tobacco Use  Smoking Status Former   Packs/day: 0.25   Years: 62.00   Pack years: 15.50   Types: Cigarettes   Quit date: 09/05/2018   Years since quitting: 2.3  Smokeless Tobacco Never    Diagnosis:  Malignant neoplasm of lung, unspecified laterality, unspecified part of lung ([place])  Initial Exercise Prescription:  Initial Exercise Prescription - 08/31/20 1200       Date of Initial Exercise RX and Referring Provider   Date 08/31/20    Referring Provider [name] MD      Recumbant Bike   Level 1    RPM 60    Watts 10    Minutes 15    METs 1      NuStep   Level 1    SPM 80    Minutes 15    METs 1      Track   Laps 10    Minutes 15    METs 1      Prescription Details   Frequency (times per week) 2    Duration Progress to 30 minutes of continuous aerobic without signs/symptoms of physical distress      Intensity   THRR 40-80% of Max Heartrate 106-130    Ratings of Perceived Exertion 11-13    Perceived Dyspnea 0-4      Progression   Progression Continue to progress workloads to maintain intensity without signs/symptoms of physical distress.      Resistance Training   Training Prescription Yes    Weight 3 lb    Reps 10-15             Discharge Exercise Prescription (Final Exercise Prescription Changes):  Exercise Prescription Changes - 01/11/21 1500       Response to Exercise   Blood Pressure (Admit) 120/70    Blood Pressure (Exercise) 150/70    Blood Pressure (Exit) 142/82    Heart Rate (Admit) 88 bpm    Heart Rate (Exercise) 115 bpm    Heart Rate (Exit) 100 bpm    Oxygen Saturation (Admit) 90 %    Oxygen Saturation  (Exercise) 85 %    Oxygen Saturation (Exit) 94 %    Rating of Perceived Exertion (Exercise) 13    Perceived Dyspnea (Exercise) 2    Symptoms SOB    Comments .Marland Kitchen    Duration Continue with 30 min of aerobic exercise without signs/symptoms of physical distress.    Intensity THRR unchanged      Progression   Progression Continue to progress workloads to maintain intensity without signs/symptoms of physical distress.    Average METs 1.8      Resistance Training   Training Prescription Yes    Weight 5 lb    Reps 10-15      Interval Training   Interval Training No      NuStep   Level 5    Minutes 15    METs 1.8      T5 Nustep   Level 3    Minutes 15    METs 1.9      Home Exercise Plan   Plans to continue exercise at [employer] (comment)   [employer]  Frequency Add 2 additional days to program exercise sessions.    Initial Home Exercises Provided 10/08/20      Oxygen   Maintain Oxygen Saturation 88% or higher             Functional Capacity:  6 Minute Walk     Row Name 08/31/20 1240 01/07/21 0954       6 Minute Walk   Phase Initial Discharge    Distance 780 feet 985 feet    Distance % Change -- 26 %    Distance Feet Change -- 205 ft    Walk Time 6 minutes 6 minutes    # of Rest Breaks 0 0    MPH 1.47 1.87    METS 1.08 1.64    RPE 13 13    Perceived Dyspnea  1 2    VO2 Peak 3.81 5.73    Symptoms Yes (comment) No    Comments Lower back pain 7/10 Lower back pain 6/10    Resting HR 82 bpm 88 bpm    Resting BP 132/78 120/70    Resting Oxygen Saturation  94 % 93 %    Exercise Oxygen Saturation  during 6 min walk 86 % 84 %    Max Ex. HR 101 bpm 115 bpm    Max Ex. BP 144/76 150/70    2 Minute Post BP 124/78 142/82         Interval HR   1 Minute HR 93 97    2 Minute HR 97 104    3 Minute HR 98 115    4 Minute HR 99 108    5 Minute HR 101 109    6 Minute HR 100 111    2 Minute Post HR 87 96    Interval Heart Rate? Yes Yes         Interval Oxygen    Interval Oxygen? Yes Yes    Baseline Oxygen Saturation % 94 % 93 %    1 Minute Oxygen Saturation % 90 % 89 %    1 Minute Liters of Oxygen 0 L  RA 0 L    2 Minute Oxygen Saturation % 87 % 86 %    2 Minute Liters of Oxygen 0 L 0 L    3 Minute Oxygen Saturation % 86 % 86 %    3 Minute Liters of Oxygen 0 L 0 L    4 Minute Oxygen Saturation % 87 % 85 %    4 Minute Liters of Oxygen 0 L 0 L    5 Minute Oxygen Saturation % 86 % 85 %    5 Minute Liters of Oxygen 0 L 0 L    6 Minute Oxygen Saturation % 88 % 84 %    6 Minute Liters of Oxygen 0 L 0 L    2 Minute Post Oxygen Saturation % 94 % 93 %    2 Minute Post Liters of Oxygen 0 L 0 L             Nutrition & Weight - Outcomes:  Pre Biometrics - 08/31/20 1233       Pre Biometrics   Height 5' 10.75" (1.797 m)    Weight 271 lb 9.6 oz (123.2 kg)    BMI (Calculated) 38.15    Single Leg Stand 8.16 seconds             Post Biometrics - 01/07/21 1000        Post  Biometrics   Height 5' 10.75" (1.797 m)    Weight 272 lb 11.2 oz (123.7 kg)    BMI (Calculated) 38.31    Single Leg Stand 3.47 seconds            Goals reviewed with patient; copy given to patient.

## 2021-01-12 ENCOUNTER — Other Ambulatory Visit: Payer: Self-pay

## 2021-01-12 DIAGNOSIS — C3490 Malignant neoplasm of unspecified part of unspecified bronchus or lung: Secondary | ICD-10-CM

## 2021-01-12 NOTE — Progress Notes (Signed)
Daily Session Note  Patient Details  Name: RYOMA NOFZIGER MRN: [id number] Date of Birth: 06-26-1940 Referring Provider:   Flowsheet Row Pulmonary Rehab from 08/31/2020 in [facility] Cardiac and Pulmonary Rehab  Referring Provider [name] MD       Encounter Date: 01/12/2021  Check In:  Session Check In - 01/12/21 0925       Check-In   Supervising physician immediately available to respond to emergencies See telemetry face sheet for immediately available ER MD    Location [facility]-Cardiac & Pulmonary Rehab    Staff Present Birdie Sons, MPA, RN;Jessica Luan Pulling, MA, RCEP, CCRP, CCET;Amanda Sommer, BA, ACSM CEP, Exercise Physiologist    Virtual Visit No    Medication changes reported     No    Fall or balance concerns reported    No    Tobacco Cessation No Change    Warm-up and Cool-down Performed on first and last piece of equipment    Resistance Training Performed Yes    VAD Patient? No    PAD/SET Patient? No      Pain Assessment   Currently in Pain? No/denies                Social History   Tobacco Use  Smoking Status Former   Packs/day: 0.25   Years: 62.00   Pack years: 15.50   Types: Cigarettes   Quit date: 09/05/2018   Years since quitting: 2.3  Smokeless Tobacco Never    Goals Met:  Independence with exercise equipment Exercise tolerated well No report of concerns or symptoms today Strength training completed today  Goals Unmet:  Not Applicable  Comments: Pt able to follow exercise prescription today without complaint.  Will continue to monitor for progression.    Dr. Emily Filbert is Medical Director for [place].  Dr. Ottie Glazier is Medical Director for [facility] Pulmonary Rehabilitation.

## 2021-01-13 ENCOUNTER — Ambulatory Visit
Admission: RE | Admit: 2021-01-13 | Discharge: 2021-01-13 | Disposition: A | Discharge status: Home / Self Care | Payer: No Typology Code available for payment source | Source: Ambulatory Visit | Attending: Oncology | Admitting: Oncology

## 2021-01-13 DIAGNOSIS — C3492 Malignant neoplasm of unspecified part of left bronchus or lung: Secondary | ICD-10-CM | POA: Diagnosis not present

## 2021-01-13 LAB — POCT I-STAT CREATININE: Creatinine, Ser: 1.6 mg/dL — ABNORMAL HIGH (ref 0.61–1.24)

## 2021-01-13 MED ORDER — IOHEXOL 350 MG/ML SOLN
75.0000 mL | Freq: Once | INTRAVENOUS | Status: AC | PRN
  Administered 2021-01-13: 75 mL via INTRAVENOUS

## 2021-01-14 ENCOUNTER — Other Ambulatory Visit: Payer: Self-pay

## 2021-01-14 DIAGNOSIS — C3490 Malignant neoplasm of unspecified part of unspecified bronchus or lung: Secondary | ICD-10-CM | POA: Diagnosis not present

## 2021-01-14 NOTE — Progress Notes (Signed)
Daily Session Note  Patient Details  Name: HAKEEN SHIPES MRN: [id number] Date of Birth: [name] 20, 1941 Referring Provider:   Flowsheet Row Pulmonary Rehab from 08/31/2020 in [facility] Cardiac and Pulmonary Rehab  Referring Provider [name] MD       Encounter Date: 01/14/2021  Check In:  Session Check In - 01/14/21 0912       Check-In   Supervising physician immediately available to respond to emergencies See telemetry face sheet for immediately available ER MD    Location [facility]-Cardiac & Pulmonary Rehab    Staff Present Birdie Sons, MPA, RN;Deloss Amico Jacobus, MA, RCEP, CCRP, CCET;Amanda Sommer, BA, ACSM CEP, Exercise Physiologist;Kelly Amedeo Plenty, BS, ACSM CEP, Exercise Physiologist;Joseph Staples, [place]    Virtual Visit No    Medication changes reported     No    Fall or balance concerns reported    No    Warm-up and Cool-down Performed on first and last piece of equipment    Resistance Training Performed Yes    VAD Patient? No    PAD/SET Patient? No      Pain Assessment   Currently in Pain? No/denies                Social History   Tobacco Use  Smoking Status Former   Packs/day: 0.25   Years: 62.00   Pack years: 15.50   Types: Cigarettes   Quit date: 09/05/2018   Years since quitting: 2.3  Smokeless Tobacco Never    Goals Met:  Proper associated with RPD/PD & O2 Sat Independence with exercise equipment Using PLB without cueing & demonstrates good technique Exercise tolerated well No report of concerns or symptoms today Strength training completed today  Goals Unmet:  Not Applicable  Comments: Pt able to follow exercise prescription today without complaint.  Will continue to monitor for progression.    Dr. Emily Filbert is Medical Director for [place].  Dr. Ottie Glazier is Medical Director for [facility] Pulmonary Rehabilitation.

## 2021-01-17 NOTE — Progress Notes (Signed)
[place]  [phone] [phone]  ID: [name] OB: 1940/10/23  MR#: [id number]  NLZ#:[id number]  Patient Care Team: Eliezer Lofts, [place] as PCP - General (Family Medicine) Kate Sable, MD as PCP - Cardiology (Cardiology) Telford Nab, RN as Registered Nurse Grayland Ormond, Kathlene November, MD as Consulting Physician (Oncology) Noreene Filbert, MD as Referring Physician (Radiation Oncology) Nestor Lewandowsky, MD (Inactive) as Referring Physician (Cardiothoracic Surgery)  CHIEF COMPLAINT: Clinical stage IIB squamous cell carcinoma, left upper lobe lung.  INTERVAL HISTORY: Patient returns to clinic today for routine 31-month evaluation and discussion of his imaging results.  He continues to be anxious, but otherwise feels well.  He has no neurologic complaints.  He denies any recent fevers or illnesses.  He denies any chest pain, shortness of breath, cough, or hemoptysis.  He has a good appetite and denies weight loss.  He denies any nausea, vomiting, constipation, or diarrhea.  He has no urinary complaints.  Patient offers no further specific complaints today.  REVIEW OF SYSTEMS:   Review of Systems  Constitutional: Negative.  Negative for fever, malaise/fatigue and weight loss.  HENT: Negative.  Negative for congestion.   Respiratory: Negative.  Negative for cough, hemoptysis and shortness of breath.   Cardiovascular: Negative.  Negative for chest pain and leg swelling.  Gastrointestinal: Negative.  Negative for abdominal pain and nausea.  Genitourinary: Negative.  Negative for dysuria.  Musculoskeletal: Negative.  Negative for back pain.  Skin:  Negative for rash.  Neurological: Negative.  Negative for dizziness, focal weakness, weakness and headaches.  Endo/Heme/Allergies:  Does not bruise/bleed easily.  Psychiatric/Behavioral:  Negative for depression. The patient is nervous/anxious.    As per HPI. Otherwise, a complete review of systems is  negative.  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[place])    Prostate enlargement    Shortness of breath    Squamous cell carcinoma of lung, left ([place])    Thyroid disease    Tobacco abuse     PAST SURGICAL HISTORY: Past Surgical History:  Procedure Laterality Date   ELECTROMAGNETIC NAVIGATION BROCHOSCOPY Left 10/19/2018   Procedure: ELECTROMAGNETIC NAVIGATION BRONCHOSCOPY LEFT;  Surgeon: Tyler Pita, MD;  Location: [facility] ORS;  Service: Cardiopulmonary;  Laterality: Left;   ENDOBRONCHIAL ULTRASOUND Left 10/19/2018   Procedure: ENDOBRONCHIAL ULTRASOUND LEFT;  Surgeon: Tyler Pita, MD;  Location: [facility] ORS;  Service: Cardiopulmonary;  Laterality: Left;   MELANOMA EXCISION Left    NO PAST SURGERIES      FAMILY HISTORY: Family History  Problem Relation Age of Onset   Aneurysm Mother    Cancer Father     ADVANCED DIRECTIVES (Y/N):  N  HEALTH MAINTENANCE: Social History   Tobacco Use   Smoking status: Former    Packs/day: 0.25    Years: 62.00    Pack years: 15.50    Types: Cigarettes    Quit date: 09/05/2018    Years since quitting: 2.3   Smokeless tobacco: Never  Vaping Use   Vaping Use: Never used  Substance Use Topics   Alcohol use: No    Alcohol/week: 0.0 standard drinks   Drug use: No     Colonoscopy:  PAP:  Bone density:  Lipid panel:  No Known Allergies  Current  Outpatient Medications  Medication Sig Dispense Refill   albuterol (PROVENTIL HFA;VENTOLIN HFA) 108 (90 Base) MCG/ACT inhaler Inhale 2 puffs into the lungs every 6 (six) hours as needed for wheezing or shortness of breath. 1 Inhaler 0   budesonide-formoterol (SYMBICORT) 160-4.5 MCG/ACT inhaler Inhale 2 puffs into the lungs 2 (two) times daily.     diltiazem (DILACOR XR) 120 MG 24 hr capsule Take 120 mg  by mouth daily.     levothyroxine (SYNTHROID) 137 MCG tablet Take 137 mcg by mouth daily before breakfast.     tamsulosin (FLOMAX) 0.4 MG CAPS capsule Take 0.4 mg by mouth daily.      vitamin B-12 (CYANOCOBALAMIN) 1000 MCG tablet Take 1,000 mcg by mouth daily.     Vitamin D, Ergocalciferol, (DRISDOL) 1.25 MG (50000 UT) CAPS capsule Take 50,000 Units by mouth every 7 (seven) days.     apixaban (ELIQUIS) 5 MG TABS tablet Take 1 tablet (5 mg total) by mouth 2 (two) times daily. 180 tablet 3   No current facility-administered medications for this visit.    OBJECTIVE: Vitals:   01/19/21 1029  BP: 135/82  Pulse: 82  Resp: 16  Temp: 98.2 F (36.8 C)  SpO2: 99%     Body mass index is 38.16 kg/m.    ECOG FS:0 - Asymptomatic  General: Well-developed, well-nourished, no acute distress. Eyes: Pink conjunctiva, anicteric sclera. HEENT: Normocephalic, moist mucous membranes. Lungs: No audible wheezing or coughing. Heart: Regular rate and rhythm. Abdomen: Soft, nontender, no obvious distention. Musculoskeletal: No edema, cyanosis, or clubbing. Neuro: Alert, answering all questions appropriately. Cranial nerves grossly intact. Skin: No rashes or petechiae noted. Psych: Normal affect.   LAB RESULTS:  Lab Results  Component Value Date   NA 139 07/18/2019   K 4.2 07/18/2019   CL 108 07/18/2019   CO2 23 07/18/2019   GLUCOSE 151 (H) 07/18/2019   BUN 19 07/18/2019   CREATININE 1.60 (H) 01/13/2021   CALCIUM 8.4 (L) 07/18/2019   PROT 6.4 (L) 07/18/2019   ALBUMIN 3.7 07/18/2019   AST 29 07/18/2019   ALT 35 07/18/2019   ALKPHOS 51 07/18/2019   BILITOT 0.8 07/18/2019   GFRNONAA 50 (L) 07/18/2019   GFRAA 58 (L) 07/18/2019    Lab Results  Component Value Date   WBC 5.8 07/18/2019   NEUTROABS 3.5 07/18/2019   HGB 14.6 07/18/2019   HCT 44.1 07/18/2019   MCV 93.6 07/18/2019   PLT 162 07/18/2019     STUDIES: CT Chest W Contrast  Result Date: 01/13/2021 CLINICAL DATA:  Six-month  follow-up of non-small-cell lung cancer. Status post chemotherapy and radiation therapy. EXAM: CT CHEST WITH CONTRAST TECHNIQUE: Multidetector CT imaging of the chest was performed during intravenous contrast administration. CONTRAST:  69mL OMNIPAQUE IOHEXOL 350 MG/ML SOLN COMPARISON:  07/15/2020 FINDINGS: Cardiovascular: Aortic atherosclerosis. Tortuous thoracic aorta. Normal heart size, without pericardial effusion. Three vessel coronary artery calcification. No central pulmonary embolism, on this non-dedicated study. Mediastinum/Nodes: No supraclavicular adenopathy. No mediastinal or hilar adenopathy. Lungs/Pleura: No pleural fluid. 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 Minimal progression of central left upper lobe consolidation and bronchiectasis, consistent with evolving radiation change. Minimal progression of more peripheral and superiora left upper lobe/apical radiation induced scarring. 9 mm left upper lobe pulmonary nodule on 41/3, similar to minimally decreased from 10 mm on the prior. Upper Abdomen: Hepatic steatosis. Normal imaged portions of the spleen, stomach, pancreas, gallbladder, adrenal glands, left kidney. Upper pole right renal 2.3 cm cyst. Musculoskeletal: No acute osseous abnormality. IMPRESSION:  1. Minimal progression of left upper lobe radiation change. 2. Similar to minimal decrease in size of a left upper lobe pulmonary nodule. 3. No thoracic adenopathy. 4. Aortic Atherosclerosis (ICD10-I70.0) and Emphysema (ICD10-J43.9). Coronary artery atherosclerosis. Electronically Signed   By: Abigail Miyamoto M.D.   On: 01/13/2021 16:27     ASSESSMENT: Clinical stage IIB squamous cell carcinoma, left upper lobe lung.  PLAN:    1.Clinical stage IIB squamous cell carcinoma, left upper lobe lung: PET scan results from October 08, 2018 reviewed independently.  Patient underwent biopsy with navigational bronchoscopy on October 19, 2018 confirming diagnosis.  Patient declined surgery and wished to pursue XRT  along with concurrent chemotherapy.  Given his stage of disease he did not receive maintenance immunotherapy.  He completed cycle 7 of weekly carboplatinum and Taxol on January 09, 2019 and then completed XRT on January 14, 2019.  CT scan results from January 13, 2021 reviewed independently and report as above with no obvious evidence of recurrent or progressive disease.  No intervention is needed at this time.  Continue CT scans every 6 months until patient is 3 years removed from completing his treatments at which time we will transition to yearly imaging and evaluation.  Return to clinic in 6 months with repeat CT scan and further evaluation.   2.  History of melanoma: Unclear stage or depth, although by report was in situ.  Patient had Mohs surgery in fall 2019.  Lung biopsy consistent with squamous cell carcinoma. 3.  Anxiety/depression: Chronic and unchanged.  Continue current medications as prescribed.  Continue follow-up with primary care physician as well as psychiatry at the [place]. 4.  Renal insufficiency: Chronic and unchanged.  Patient's most recent creatinine is 1.6.  Continue monitoring and treatment per primary care at the [facility]. 5.  Dyspnea on exertion: Patient does not complain of this today.  Patient expressed understanding and was in agreement with this plan. He also understands that He can call clinic at any time with any questions, concerns, or complaints.   Cancer Staging Squamous cell carcinoma lung, left ([place]) Staging form: Lung, AJCC 8th Edition - Clinical stage from 10/27/2018: Stage IIB (cT1c, cN1, cM0) - Signed by Lloyd Huger, MD on 11/16/2018   Lloyd Huger, MD   01/19/2021 5:26 PM

## 2021-01-19 ENCOUNTER — Inpatient Hospital Stay: Payer: No Typology Code available for payment source | Attending: Oncology | Admitting: Oncology

## 2021-01-19 VITALS — BP 135/82 | HR 82 | Temp 98.2°F | Resp 16 | Wt 271.7 lb

## 2021-01-19 DIAGNOSIS — C3412 Malignant neoplasm of upper lobe, left bronchus or lung: Secondary | ICD-10-CM | POA: Insufficient documentation

## 2021-01-19 DIAGNOSIS — Z85820 Personal history of malignant melanoma of skin: Secondary | ICD-10-CM | POA: Insufficient documentation

## 2021-01-19 DIAGNOSIS — C3492 Malignant neoplasm of unspecified part of left bronchus or lung: Secondary | ICD-10-CM | POA: Diagnosis not present

## 2021-01-19 DIAGNOSIS — E039 Hypothyroidism, unspecified: Secondary | ICD-10-CM | POA: Diagnosis not present

## 2021-01-19 DIAGNOSIS — Z7901 Long term (current) use of anticoagulants: Secondary | ICD-10-CM | POA: Insufficient documentation

## 2021-01-19 NOTE — Progress Notes (Signed)
Pt c/o constipation(not going enough) despite otc stool softeners. Pt reports urinary frequency with little output.

## 2021-01-21 ENCOUNTER — Other Ambulatory Visit: Payer: Self-pay

## 2021-01-21 DIAGNOSIS — C3490 Malignant neoplasm of unspecified part of unspecified bronchus or lung: Secondary | ICD-10-CM | POA: Diagnosis not present

## 2021-01-21 NOTE — Progress Notes (Signed)
Daily Session Note  Patient Details  Name: Levi [name] MRN: [id number] Date of Birth: [birth year] Referring Provider:   Flowsheet Row Pulmonary Rehab from 08/31/2020 in [facility] Cardiac and Pulmonary Rehab  Referring Provider [name] MD       Encounter Date: 01/21/2021  Check In:  Session Check In - 01/21/21 0917       Check-In   Supervising physician immediately available to respond to emergencies See telemetry face sheet for immediately available ER MD    Location [facility]-Cardiac & Pulmonary Rehab    Staff Present Birdie Sons, MPA, Elveria Rising, BA, ACSM CEP, Exercise Physiologist;Harleigh Civello Amedeo Plenty, BS, ACSM CEP, Exercise Physiologist    Virtual Visit No    Medication changes reported     No    Fall or balance concerns reported    No    Tobacco Cessation No Change    Warm-up and Cool-down Performed on first and last piece of equipment    Resistance Training Performed Yes    VAD Patient? No    PAD/SET Patient? No      Pain Assessment   Currently in Pain? No/denies                Social History   Tobacco Use  Smoking Status Former   Packs/day: 0.25   Years: 62.00   Pack years: 15.50   Types: Cigarettes   Quit date: 09/05/2018   Years since quitting: 2.3  Smokeless Tobacco Never    Goals Met:  Independence with exercise equipment Exercise tolerated well No report of concerns or symptoms today Strength training completed today  Goals Unmet:  Not Applicable  Comments: Pt able to follow exercise prescription today without complaint.  Will continue to monitor for progression.    Dr. Emily Filbert is Medical Director for [place].  Dr. Ottie Glazier is Medical Director for [facility] Pulmonary Rehabilitation.

## 2021-01-26 ENCOUNTER — Other Ambulatory Visit: Payer: Self-pay

## 2021-01-26 DIAGNOSIS — C3490 Malignant neoplasm of unspecified part of unspecified bronchus or lung: Secondary | ICD-10-CM

## 2021-01-26 NOTE — Progress Notes (Signed)
Discharge Progress Report  Patient Details  Name: Levi [name] MRN: [id number] Date of Birth: 08/20/1940 Referring Provider:   Flowsheet Row Pulmonary Rehab from 08/31/2020 in [facility] Cardiac and Pulmonary Rehab  Referring Provider [name] MD        Number of Visits: 36  Reason for Discharge:  Patient reached a stable level of exercise. Patient independent in their exercise. Patient has met program and personal goals.  Smoking History:  Social History   Tobacco Use  Smoking Status Former   Packs/day: 0.25   Years: 62.00   Pack years: 15.50   Types: Cigarettes   Quit date: 09/05/2018   Years since quitting: 2.3  Smokeless Tobacco Never    Diagnosis:  Malignant neoplasm of lung, unspecified laterality, unspecified part of lung ([place])  ADL UCSD:  Pulmonary Assessment Scores     Row Name 08/31/20 1237 01/26/21 1031       ADL UCSD   ADL Phase Entry Exit    SOB Score total 53 44    Rest 0 0    Walk 2 2    Stairs 4 4    Bath 3 1    Dress 3 3    Shop 2 2         CAT Score   CAT Score 26 19         mMRC Score   mMRC Score 3 2             Initial Exercise Prescription:  Initial Exercise Prescription - 08/31/20 1200       Date of Initial Exercise RX and Referring Provider   Date 08/31/20    Referring Provider [name] MD      Recumbant Bike   Level 1    RPM 60    Watts 10    Minutes 15    METs 1      NuStep   Level 1    SPM 80    Minutes 15    METs 1      Track   Laps 10    Minutes 15    METs 1      Prescription Details   Frequency (times per week) 2    Duration Progress to 30 minutes of continuous aerobic without signs/symptoms of physical distress      Intensity   THRR 40-80% of Max Heartrate 106-130    Ratings of Perceived Exertion 11-13    Perceived Dyspnea 0-4      Progression   Progression Continue to progress workloads to maintain intensity without signs/symptoms of physical distress.      Resistance Training    Training Prescription Yes    Weight 3 lb    Reps 10-15             Discharge Exercise Prescription (Final Exercise Prescription Changes):  Exercise Prescription Changes - 01/25/21 1300       Response to Exercise   Blood Pressure (Admit) 148/82    Blood Pressure (Exit) 122/72    Heart Rate (Admit) 80 bpm    Heart Rate (Exercise) 97 bpm    Heart Rate (Exit) 93 bpm    Oxygen Saturation (Admit) 92 %    Oxygen Saturation (Exercise) 90 %    Oxygen Saturation (Exit) 91 %    Rating of Perceived Exertion (Exercise) 13    Perceived Dyspnea (Exercise) 2    Symptoms SOB    Duration Continue with 30 min of aerobic exercise without signs/symptoms  of physical distress.    Intensity THRR unchanged      Progression   Progression Continue to progress workloads to maintain intensity without signs/symptoms of physical distress.    Average METs 2.2      Resistance Training   Training Prescription Yes    Weight 5 lb    Reps 10-15      Interval Training   Interval Training No      Home Exercise Plan   Plans to continue exercise at [employer] (comment)   [employer]   Frequency Add 2 additional days to program exercise sessions.    Initial Home Exercises Provided 10/08/20             Functional Capacity:  6 Minute Walk     Row Name 08/31/20 1240 01/07/21 0954       6 Minute Walk   Phase Initial Discharge    Distance 780 feet 985 feet    Distance % Change -- 26 %    Distance Feet Change -- 205 ft    Walk Time 6 minutes 6 minutes    # of Rest Breaks 0 0    MPH 1.47 1.87    METS 1.08 1.64    RPE 13 13    Perceived Dyspnea  1 2    VO2 Peak 3.81 5.73    Symptoms Yes (comment) No    Comments Lower back pain 7/10 Lower back pain 6/10    Resting HR 82 bpm 88 bpm    Resting BP 132/78 120/70    Resting Oxygen Saturation  94 % 93 %    Exercise Oxygen Saturation  during 6 min walk 86 % 84 %    Max Ex. HR 101 bpm 115 bpm    Max Ex. BP 144/76 150/70    2 Minute Post BP  124/78 142/82         Interval HR   1 Minute HR 93 97    2 Minute HR 97 104    3 Minute HR 98 115    4 Minute HR 99 108    5 Minute HR 101 109    6 Minute HR 100 111    2 Minute Post HR 87 96    Interval Heart Rate? Yes Yes         Interval Oxygen   Interval Oxygen? Yes Yes    Baseline Oxygen Saturation % 94 % 93 %    1 Minute Oxygen Saturation % 90 % 89 %    1 Minute Liters of Oxygen 0 L  RA 0 L    2 Minute Oxygen Saturation % 87 % 86 %    2 Minute Liters of Oxygen 0 L 0 L    3 Minute Oxygen Saturation % 86 % 86 %    3 Minute Liters of Oxygen 0 L 0 L    4 Minute Oxygen Saturation % 87 % 85 %    4 Minute Liters of Oxygen 0 L 0 L    5 Minute Oxygen Saturation % 86 % 85 %    5 Minute Liters of Oxygen 0 L 0 L    6 Minute Oxygen Saturation % 88 % 84 %    6 Minute Liters of Oxygen 0 L 0 L    2 Minute Post Oxygen Saturation % 94 % 93 %    2 Minute Post Liters of Oxygen 0 L 0 L  Psychological, QOL, Others - Outcomes: PHQ 2/9: Depression screen [facility] 2/9 01/26/2021 08/31/2020 10/20/2015 10/16/2015 01/07/2015  Decreased Interest 1 1 0 0 0  Down, Depressed, Hopeless 0 0 0 0 0  PHQ - 2 Score 1 1 0 0 0  Altered sleeping 1 0 - - -  Tired, decreased energy 2 1 - - -  Change in appetite 2 0 - - -  Feeling bad or failure about yourself  0 0 - - -  Trouble concentrating 0 0 - - -  Moving slowly or fidgety/restless 0 0 - - -  Suicidal thoughts 0 0 - - -  PHQ-9 Score 6 2 - - -  Difficult doing work/chores Somewhat difficult Not difficult at all - - -    Quality of Life:    Nutrition & Weight - Outcomes:  Pre Biometrics - 08/31/20 1233       Pre Biometrics   Height 5' 10.75" (1.797 m)    Weight 271 lb 9.6 oz (123.2 kg)    BMI (Calculated) 38.15    Single Leg Stand 8.16 seconds             Post Biometrics - 01/07/21 1000        Post  Biometrics   Height 5' 10.75" (1.797 m)    Weight 272 lb 11.2 oz (123.7 kg)    BMI (Calculated) 38.31    Single Leg Stand  3.47 seconds             Nutrition:  Nutrition Therapy & Goals - 09/17/20 1146       Nutrition Therapy   RD appointment deferred Yes   Arsalan does not want to meet with the RD at this time - will continue to check in.            Nutrition Discharge:   Education Questionnaire Score:  Knowledge Questionnaire Score - 01/26/21 1033       Knowledge Questionnaire Score   Pre Score 16/18: Oxygen, Lung disease    Post Score 17/18             Goals reviewed with patient; copy given to patient.

## 2021-01-26 NOTE — Progress Notes (Signed)
Daily Session Note  Patient Details  Name: Levi [name] MRN: [id number] Date of Birth: [birth year] Referring Provider:   Flowsheet Row Pulmonary Rehab from 08/31/2020 in [facility] Cardiac and Pulmonary Rehab  Referring Provider [name] MD       Encounter Date: 01/26/2021  Check In:  Session Check In - 01/26/21 0925       Check-In   Supervising physician immediately available to respond to emergencies See telemetry face sheet for immediately available ER MD    Location [facility]-Cardiac & Pulmonary Rehab    Staff Present Birdie Sons, MPA, RN;Amanda Oletta Darter, BA, ACSM CEP, Exercise Physiologist;Jessica Luan Pulling, MA, RCEP, CCRP, CCET    Virtual Visit No    Medication changes reported     No    Fall or balance concerns reported    No    Tobacco Cessation No Change    Warm-up and Cool-down Performed on first and last piece of equipment    Resistance Training Performed Yes    VAD Patient? No    PAD/SET Patient? No      Pain Assessment   Currently in Pain? No/denies                Social History   Tobacco Use  Smoking Status Former   Packs/day: 0.25   Years: 62.00   Pack years: 15.50   Types: Cigarettes   Quit date: 09/05/2018   Years since quitting: 2.3  Smokeless Tobacco Never    Goals Met:  Independence with exercise equipment Exercise tolerated well No report of concerns or symptoms today Strength training completed today  Goals Unmet:  Not Applicable  Comments:  Levi [name] graduated today from  rehab with 36 sessions completed.  Details of the patient's exercise prescription and what He needs to do in order to continue the prescription and progress were discussed with patient.  Patient was given a copy of prescription and goals.  Patient verbalized understanding.  Levi [name] plans to continue to exercise by going to the [facility].    Dr. Emily Filbert is Medical Director for [place].  Dr. Ottie Glazier is Medical Director for [facility]  Pulmonary Rehabilitation.

## 2021-01-26 NOTE — Progress Notes (Signed)
Pulmonary Individual Treatment Plan  Patient Details  Name: Levi [name] MRN: [id number] Date of Birth: Sep 15, 1940 Referring Provider:   Flowsheet Row Pulmonary Rehab from 08/31/2020 in [facility] Card[facility] and Pulmonary Rehab  Referring Provider [name] MD       Initial Encounter Date:  Flowsheet Row Pulmonary Rehab from 08/31/2020 in [facility] Cardiac and Pulmonary Rehab  Date 08/31/20       Visit Diagnosis: Malignant neoplasm of lung, unspecified laterality, unspecified part of lung ([place])  Patient's Home Medications on Admission:  Current Outpatient Medications:    albuterol (PROVENTIL HFA;VENTOLIN HFA) 108 (90 Base) MCG/ACT inhaler, Inhale 2 puffs into the lungs every 6 (six) hours as needed for wheezing or shortness of breath., Disp: 1 Inhaler, Rfl: 0   apixaban (ELIQUIS) 5 MG TABS tablet, Take 1 tablet (5 mg total) by mouth 2 (two) times daily., Disp: 180 tablet, Rfl: 3   budesonide-formoterol (SYMBICORT) 160-4.5 MCG/ACT inhaler, Inhale 2 puffs into the lungs 2 (two) times daily., Disp: , Rfl:    diltiazem (DILACOR XR) 120 MG 24 hr capsule, Take 120 mg by mouth daily., Disp: , Rfl:    levothyroxine (SYNTHROID) 137 MCG tablet, Take 137 mcg by mouth daily before breakfast., Disp: , Rfl:    tamsulosin (FLOMAX) 0.4 MG CAPS capsule, Take 0.4 mg by mouth daily. 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[place])    Prostate enlargement    Shortness of breath    Squamous cell carcinoma of lung, left (H[facility])    Thyroid disease    Tobacco abuse     Tobacco  Use: Social History   Tobacco Use  Smoking Status Former   Packs/day: 0.25   Years: 62.00   Pack years: 15.50   Types: Cigarettes   Quit date: 09/05/2018   Years since quitting: 2.3  Smokeless Tobacco Never    Labs: Recent Review Scientist, physiological     Labs for ITP Cardiac and Pulmonary Rehab Latest Ref Rng & Units 10/15/2018   PHART 7.350 - 7.450 7.42   PCO2ART 32.0 - 48.0 mmHg 35   HCO3 20.0 - 28.0 mmol/L 22.7   ACIDBASEDEF 0.0 - 2.0 mmol/L 1.3   O2SAT % 93.1        Pulmonary Assessment Scores:  Pulmonary Assessment Scores     Row Name 08/31/20 1237 01/26/21 1031       ADL UCSD   ADL Phase Entry Exit    SOB Score total 53 44    Rest 0 0    Walk 2 2    Stairs 4 4    Bath 3 1    Dress 3 3    Shop 2 2         CAT Score   CAT Score 26 19         mMRC Score   mMRC Score 3 2             UCSD: Self-administered rating of dyspnea associated  with activities of daily living (ADLs) 6-point scale (0 = "not at all" to 5 = "maximal or unable to do because of breathlessness")  Scoring Scores range from 0 to 120.  Minimally important difference is 5 units  CAT: CAT can identify the health impairment of COPD patients and is better correlated with disease progression.  CAT has a scoring range of zero to 40. The CAT score is classified into four groups of low (less than 10), medium (10 - 20), high (21-30) and very high (31-40) based on the impact level of disease on health status. A CAT score over 10 suggests significant symptoms.  A worsening CAT score could be explained by an exacerbation, poor medication adherence, poor inhaler technique, or progression of COPD or comorbid conditions.  CAT MCID is 2 points  mMRC: mMRC (Modified Medical Research Council) Dyspnea Scale is used to assess the degree of baseline functional disability in patients of respiratory disease due to dyspnea. No minimal important difference is established. A decrease in score of 1 point or greater  is considered a positive change.   Pulmonary Function Assessment:   Exercise Target Goals: Exercise Program Goal: Individual exercise prescription set using results from initial 6 min walk test and THRR while considering  patient's activity barriers and safety.   Exercise Prescription Goal: Initial exercise prescription builds to 30-45 minutes a day of aerobic activity, 2-3 days per week.  Home exercise guidelines will be given to patient during program as part of exercise prescription that the participant will acknowledge.  Education: Aerobic Exercise: - Group verbal and visual presentation on the components of exercise prescription. Introduces F.I.T.T principle from ACSM for exercise prescriptions.  Reviews F.I.T.T. principles of aerobic exercise including progression. Written material given at graduation.   Education: Resistance Exercise: - Group verbal and visual presentation on the components of exercise prescription. Introduces F.I.T.T principle from ACSM for exercise prescriptions  Reviews F.I.T.T. principles of resistance exercise including progression. Written material given at graduation.    Education: Exercise & Equipment Safety: - Individual verbal instruction and demonstration of equipment use and safety with use of the equipment. Flowsheet Row Pulmonary Rehab from 10/08/2020 in [facility] Cardiac and Pulmonary Rehab  Education need identified 08/31/20  Date 08/31/20  Educator [place]  Instruction Review Code 1- Verbalizes Understanding       Education: Exercise Physiology & General Exercise Guidelines: - Group verbal and written instruction with models to review the exercise physiology of the cardiovascular system and associated critical values. Provides general exercise guidelines with specific guidelines to those with heart or lung disease.    Education: Flexibility, Balance, Mind/Body Relaxation: - Group verbal and visual presentation with interactive activity on the  components of exercise prescription. Introduces F.I.T.T principle from ACSM for exercise prescriptions. Reviews F.I.T.T. principles of flexibility and balance exercise training including progression. Also discusses the mind body connection.  Reviews various relaxation techniques to help reduce and manage stress (i.e. Deep breathing, progressive muscle relaxation, and visualization). Balance handout provided to take home. Written material given at graduation. Flowsheet Row Pulmonary Rehab from 10/08/2020 in [facility] Cardiac and Pulmonary Rehab  Date 09/03/20  Educator AS  Instruction Review Code 1- Verbalizes Understanding       Activity Barriers & Risk Stratification:  Activity Barriers & Cardiac Risk Stratification - 08/31/20 1234       Activity Barriers & Cardiac Risk Stratification   Activity Barriers Shortness of Breath;Deconditioning;Muscular Weakness;Other (comment)    Comments Sciatica  6 Minute Walk:  6 Minute Walk     Row Name 08/31/20 1240 01/07/21 0954       6 Minute Walk   Phase Initial Discharge    Distance 780 feet 985 feet    Distance % Change -- 26 %    Distance Feet Change -- 205 ft    Walk Time 6 minutes 6 minutes    # of Rest Breaks 0 0    MPH 1.47 1.87    METS 1.08 1.64    RPE 13 13    Perceived Dyspnea  1 2    VO2 Peak 3.81 5.73    Symptoms Yes (comment) No    Comments Lower back pain 7/10 Lower back pain 6/10    Resting HR 82 bpm 88 bpm    Resting BP 132/78 120/70    Resting Oxygen Saturation  94 % 93 %    Exercise Oxygen Saturation  during 6 min walk 86 % 84 %    Max Ex. HR 101 bpm 115 bpm    Max Ex. BP 144/76 150/70    2 Minute Post BP 124/78 142/82         Interval HR   1 Minute HR 93 97    2 Minute HR 97 104    3 Minute HR 98 115    4 Minute HR 99 108    5 Minute HR 101 109    6 Minute HR 100 111    2 Minute Post HR 87 96    Interval Heart Rate? Yes Yes         Interval Oxygen   Interval Oxygen? Yes Yes    Baseline Oxygen  Saturation % 94 % 93 %    1 Minute Oxygen Saturation % 90 % 89 %    1 Minute Liters of Oxygen 0 L  RA 0 L    2 Minute Oxygen Saturation % 87 % 86 %    2 Minute Liters of Oxygen 0 L 0 L    3 Minute Oxygen Saturation % 86 % 86 %    3 Minute Liters of Oxygen 0 L 0 L    4 Minute Oxygen Saturation % 87 % 85 %    4 Minute Liters of Oxygen 0 L 0 L    5 Minute Oxygen Saturation % 86 % 85 %    5 Minute Liters of Oxygen 0 L 0 L    6 Minute Oxygen Saturation % 88 % 84 %    6 Minute Liters of Oxygen 0 L 0 L    2 Minute Post Oxygen Saturation % 94 % 93 %    2 Minute Post Liters of Oxygen 0 L 0 L            Oxygen Initial Assessment:  Oxygen Initial Assessment - 08/31/20 1237       Home Oxygen   Home Oxygen Device None    Sleep Oxygen Prescription None    Home Exercise Oxygen Prescription None    Home Resting Oxygen Prescription None      Initial 6 min Walk   Oxygen Used None      Program Oxygen Prescription   Program Oxygen Prescription None      Intervention   Short Term Goals To learn and understand importance of monitoring SPO2 with pulse oximeter and demonstrate accurate use of the pulse oximeter.;To learn and understand importance of maintaining oxygen saturations>88%;To learn and demonstrate proper pursed lip  breathing techniques or other breathing techniques. ;To learn and demonstrate proper use of respiratory medications    Long  Term Goals Verbalizes importance of monitoring SPO2 with pulse oximeter and return demonstration;Maintenance of O2 saturations>88%;Exhibits proper breathing techniques, such as pursed lip breathing or other method taught during program session;Compliance with respiratory medication;Demonstrates proper use of MDI's             Oxygen Re-Evaluation:  Oxygen Re-Evaluation     Row Name 09/03/20 0950 11/05/20 0932 12/24/20 1004         Program Oxygen Prescription   Program Oxygen Prescription None None None           Home Oxygen   Home Oxygen  Device None None None     Sleep Oxygen Prescription None None None     Home Exercise Oxygen Prescription None None None     Home Resting Oxygen Prescription None None None           Goals/Expected Outcomes   Short Term Goals To learn and understand importance of monitoring SPO2 with pulse oximeter and demonstrate accurate use of the pulse oximeter.;To learn and understand importance of maintaining oxygen saturations>88%;To learn and demonstrate proper pursed lip breathing techniques or other breathing techniques.  To learn and understand importance of monitoring SPO2 with pulse oximeter and demonstrate accurate use of the pulse oximeter.;To learn and understand importance of maintaining oxygen saturations>88%;To learn and demonstrate proper pursed lip breathing techniques or other breathing techniques.  To learn and understand importance of monitoring SPO2 with pulse oximeter and demonstrate accurate use of the pulse oximeter.;To learn and understand importance of maintaining oxygen saturations>88%;To learn and demonstrate proper pursed lip breathing techniques or other breathing techniques. ;To learn and demonstrate proper use of respiratory medications     Long  Term Goals Verbalizes importance of monitoring SPO2 with pulse oximeter and return demonstration;Maintenance of O2 saturations>88%;Exhibits proper breathing techniques, such as pursed lip breathing or other method taught during program session;Exhibits compliance with exercise, home  and travel O2 prescription Verbalizes importance of monitoring SPO2 with pulse oximeter and return demonstration;Maintenance of O2 saturations>88%;Exhibits proper breathing techniques, such as pursed lip breathing or other method taught during program session;Exhibits compliance with exercise, home  and travel O2 prescription Verbalizes importance of monitoring SPO2 with pulse oximeter and return demonstration;Maintenance of O2 saturations>88%;Exhibits proper breathing  techniques, such as pursed lip breathing or other method taught during program session;Exhibits compliance with exercise, home  and travel O2 prescription;Compliance with respiratory medication;Demonstrates proper use of MDI's     Comments Reviewed PLB technique with pt.  Talked about how it works and it's importance in maintaining their exercise saturations. He continues to practice PLB and he notices his O2 will come back up. When exercising his O2 is about 89-93 at home. He feels that his breathing has improved overall and now he can do more things at home (ADLs). He still has good and bad days. Deago is doing well with his PLB.  He does find it useful when feeling SOB.  He is doing well with his inhaler and was given a spacer today to help.  Also talked about using nebulizer more frequently.     Goals/Expected Outcomes Short: Become more profiecient at using PLB.   Long: Become independent at using PLB. Short: Become more profiecient at using PLB, continue to exercise at home  Long: Become independent at using PLB. Short: Use spacer and nebulizer Long: Continue to use PLB  Oxygen Discharge (Final Oxygen Re-Evaluation):  Oxygen Re-Evaluation - 12/24/20 1004       Program Oxygen Prescription   Program Oxygen Prescription None      Home Oxygen   Home Oxygen Device None    Sleep Oxygen Prescription None    Home Exercise Oxygen Prescription None    Home Resting Oxygen Prescription None      Goals/Expected Outcomes   Short Term Goals To learn and understand importance of monitoring SPO2 with pulse oximeter and demonstrate accurate use of the pulse oximeter.;To learn and understand importance of maintaining oxygen saturations>88%;To learn and demonstrate proper pursed lip breathing techniques or other breathing techniques. ;To learn and demonstrate proper use of respiratory medications    Long  Term Goals Verbalizes importance of monitoring SPO2 with pulse oximeter and return  demonstration;Maintenance of O2 saturations>88%;Exhibits proper breathing techniques, such as pursed lip breathing or other method taught during program session;Exhibits compliance with exercise, home  and travel O2 prescription;Compliance with respiratory medication;Demonstrates proper use of MDI's    Comments Jevon is doing well with his PLB.  He does find it useful when feeling SOB.  He is doing well with his inhaler and was given a spacer today to help.  Also talked about using nebulizer more frequently.    Goals/Expected Outcomes Short: Use spacer and nebulizer Long: Continue to use PLB             Initial Exercise Prescription:  Initial Exercise Prescription - 08/31/20 1200       Date of Initial Exercise RX and Referring Provider   Date 08/31/20    Referring Provider [name] MD      Recumbant Bike   Level 1    RPM 60    Watts 10    Minutes 15    METs 1      NuStep   Level 1    SPM 80    Minutes 15    METs 1      Track   Laps 10    Minutes 15    METs 1      Prescription Details   Frequency (times per week) 2    Duration Progress to 30 minutes of continuous aerobic without signs/symptoms of physical distress      Intensity   THRR 40-80% of Max Heartrate 106-130    Ratings of Perceived Exertion 11-13    Perceived Dyspnea 0-4      Progression   Progression Continue to progress workloads to maintain intensity without signs/symptoms of physical distress.      Resistance Training   Training Prescription Yes    Weight 3 lb    Reps 10-15             Perform Capillary Blood Glucose checks as needed.  Exercise Prescription Changes:   Exercise Prescription Changes     Row Name 08/31/20 1200 09/08/20 1300 09/23/20 1400 10/06/20 1400 10/20/20 1500     Response to Exercise   Blood Pressure (Admit) 132/78 128/82 128/76 146/80 126/76   Blood Pressure (Exercise) 144/76 146/64 142/78 130/76 --   Blood Pressure (Exit) 124/78 144/74 128/76 116/70 126/74    Heart Rate (Admit) 82 bpm 88 bpm 76 bpm 87 bpm 73 bpm   Heart Rate (Exercise) 101 bpm 97 bpm 95 bpm 96 bpm 99 bpm   Heart Rate (Exit) 87 bpm 88 bpm 86 bpm 94 bpm 92 bpm   Oxygen Saturation (Admit) 94 % 90 % 92 % 90 %  93 %   Oxygen Saturation (Exercise) 86 % 89 % 89 % 91 % 91 %   Oxygen Saturation (Exit) 94 % 92 % 93 % 93 % 92 %   Rating of Perceived Exertion (Exercise) _0 Perceived Dyspnea (Exercise) _1 Symptoms Back pain 7/10, SOB none none none SOB   Comments walk test results -- -- -- --   Duration -- Progress to 30 minutes of  aerobic without signs/symptoms of physical distress Continue with 30 min of aerobic exercise without signs/symptoms of physical distress. Continue with 30 min of aerobic exercise without signs/symptoms of physical distress. Continue with 30 min of aerobic exercise without signs/symptoms of physical distress.   Intensity -- THRR unchanged THRR unchanged THRR unchanged THRR unchanged     Progression   Progression -- Continue to progress workloads to maintain intensity without signs/symptoms of physical distress. Continue to progress workloads to maintain intensity without signs/symptoms of physical distress. Continue to progress workloads to maintain intensity without signs/symptoms of physical distress. Continue to progress workloads to maintain intensity without signs/symptoms of physical distress.   Average METs -- 2 2.15 2.5 2.15     Resistance Training   Training Prescription -- Yes Yes Yes Yes   Weight -- 3 lb 3 lb 5 lb 5 lb   Reps -- 10-15 10-15 10-15 10-15     Interval Training   Interval Training -- -- No No No     NuStep   Level -- -- _2 Minutes -- -- _3 METs -- -- 2.3 2.5 2.2     Biostep-RELP   Level -- 2 -- -- --   Minutes -- 15 -- -- --   METs -- 2 -- -- --     Track   Laps -- _4 Minutes -- _5 METs -- -- 2 -- 2.1     Home Exercise Plan   Plans to continue exercise at -- -- -- --  [facility] (comment)  [employer]   Frequency -- -- -- -- Add 2 additional days to program exercise sessions.   Initial Home Exercises Provided -- -- -- -- 10/08/20    Row Name 11/05/20 0900 11/18/20 1500 12/17/20 0700 12/28/20 1300 01/11/21 1500     Response to Exercise   Blood Pressure (Admit) 142/82 132/76 124/64 132/84 120/70   Blood Pressure (Exercise) -- -- -- -- 150/70   Blood Pressure (Exit) 134/74 126/62 122/74 122/82 142/82   Heart Rate (Admit) 73 bpm 82 bpm 77 bpm 85 bpm 88 bpm   Heart Rate (Exercise) 93 bpm 101 bpm 96 bpm 102 bpm 115 bpm   Heart Rate (Exit) 78 bpm 90 bpm 93 bpm 93 bpm 100 bpm   Oxygen Saturation (Admit) 91 % 89 % 91 % 89 % 90 %   Oxygen Saturation (Exercise) 88 % 90 % 88 % 87 % 85 %   Oxygen Saturation (Exit) 96 % 96 % 92 % 93 % 94 %   Rating of Perceived Exertion (Exercise) _6 Perceived Dyspnea (Exercise) _7 Symptoms _8    Comments -- -- -- -- .Marland Kitchen   Duration Continue with 30 min of aerobic exercise without signs/symptoms of physical distress. Continue with 30 min of aerobic exercise without signs/symptoms of physical distress. Continue  with 30 min of aerobic exercise without signs/symptoms of physical distress. Continue with 30 min of aerobic exercise without signs/symptoms of physical distress. Continue with 30 min of aerobic exercise without signs/symptoms of physical distress.   Intensity _0      Progression   Progression Continue to progress workloads to maintain intensity without signs/symptoms of physical distress. Continue to progress workloads to maintain intensity without signs/symptoms of physical distress. Continue to progress workloads to maintain intensity without signs/symptoms of physical distress. Continue to progress workloads to maintain intensity without signs/symptoms of physical distress. Continue to progress workloads to maintain  intensity without signs/symptoms of physical distress.   Average METs 2.05 1.99 2.03 2.1 1.8     Resistance Training   Training Prescription _1    Weight 5 lb 5 lb 5 lb 5 lb 5 lb   Reps 10-15 10-15 10-15 10-15 10-15     Interval Training   Interval Training _2      NuStep   Level _3 Minutes _4 METs 2._5 1.9 1.8     T5 Nustep   Level -- -- -- -- 3   Minutes -- -- -- -- 15   METs -- -- -- -- 1.9     Biostep-RELP   Level 4 -- -- -- --   Minutes 15 -- -- -- --   METs 2.2 -- -- -- --     Track   Laps _6 --   Minutes 2._7 --   METs 1.98 2.03 2.25 2.31 --     Home Exercise Plan   Plans to continue exercise at [facility] (comment)  [place] (comment)  [place] (comment)  [place] (comment)  [place] (comment)  [employer]   Frequency Add 2 additional days to program exercise sessions. Add 2 additional days to program exercise sessions. Add 2 additional days to program exercise sessions. Add 2 additional days to program exercise sessions. Add 2 additional days to program exercise sessions.   Initial Home Exercises Provided 10/08/20 10/08/20 10/08/20 10/08/20 10/08/20     Oxygen   Maintain Oxygen Saturation -- -- -- -- 88% or higher    Row Name 01/25/21 1300             Response to Exercise   Blood Pressure (Admit) 148/82       Blood Pressure (Exit) 122/72       Heart Rate (Admit) 80 bpm       Heart Rate (Exercise) 97 bpm       Heart Rate (Exit) 93 bpm       Oxygen Saturation (Admit) 92 %       Oxygen Saturation (Exercise) 90 %       Oxygen Saturation (Exit) 91 %       Rating of Perceived Exertion (Exercise) 13       Perceived Dyspnea (Exercise) 2       Symptoms SOB       Duration Continue with 30 min of aerobic exercise without signs/symptoms of physical distress.       Intensity THRR unchanged               Progression   Progression  Continue to progress workloads to maintain intensity without signs/symptoms of physical distress.  Average METs 2.2               Resistance Training   Training Prescription Yes       Weight 5 lb       Reps 10-15               Interval Training   Interval Training No               Home Exercise Plan   Plans to continue exercise at [facility] (comment)  [employer]       Frequency Add 2 additional days to program exercise sessions.       Initial Home Exercises Provided 10/08/20                Exercise Comments:   Exercise Comments     Row Name 09/03/20 9449 11/20/20 1329 01/26/21 0926       Exercise Comments First full day of exercise!  Patient was oriented to gym and equipment including functions, settings, policies, and procedures.  Patient's individual exercise prescription and treatment plan were reviewed.  All starting workloads were established based on the results of the 6 minute walk test done at initial orientation visit.  The plan for exercise progression was also introduced and progression will be customized based on patient's performance and goals. Christipher has tested positive for COVID and will be out at least the next 2 weeks [place]o graduated today from  rehab with 36 sessions completed.  Details of the patient's exercise prescription and what He needs to do in order to continue the prescription and progress were discussed with patient.  Patient was given a copy of prescription and goals.  Patient verbalized understanding.  Audy plans to continue to exercise by going to the [facility].              Exercise Goals and Review:   Exercise Goals     Row Name 08/31/20 1254             Exercise Goals   Increase Physical Activity Yes       Intervention Provide advice, education, support and counseling about physical activity/exercise needs.;Develop an individualized exercise prescription for aerobic and resistive training based on initial evaluation  findings, risk stratification, comorbidities and participant's personal goals.       Expected Outcomes Short Term: Attend rehab on a regular basis to increase amount of physical activity.;Long Term: Add in home exercise to make exercise part of routine and to increase amount of physical activity.;Long Term: Exercising regularly at least 3-5 days a week.       Increase Strength and Stamina Yes       Intervention Provide advice, education, support and counseling about physical activity/exercise needs.;Develop an individualized exercise prescription for aerobic and resistive training based on initial evaluation findings, risk stratification, comorbidities and participant's personal goals.       Expected Outcomes Short Term: Increase workloads from initial exercise prescription for resistance, speed, and METs.;Short Term: Perform resistance training exercises routinely during rehab and add in resistance training at home;Long Term: Improve cardiorespiratory fitness, muscular endurance and strength as measured by increased METs and functional capacity (6MWT)       Able to understand and use rate of perceived exertion (RPE) scale Yes       Intervention Provide education and explanation on how to use RPE scale       Expected Outcomes Short Term: Able to use RPE daily in rehab to express subjective intensity  level;Long Term:  Able to use RPE to guide intensity level when exercising independently       Able to understand and use Dyspnea scale Yes       Intervention Provide education and explanation on how to use Dyspnea scale       Expected Outcomes Short Term: Able to use Dyspnea scale daily in rehab to express subjective sense of shortness of breath during exertion;Long Term: Able to use Dyspnea scale to guide intensity level when exercising independently       Knowledge and understanding of Target Heart Rate Range (THRR) Yes       Intervention Provide education and explanation of THRR including how the numbers  were predicted and where they are located for reference       Expected Outcomes Short Term: Able to state/look up THRR;Short Term: Able to use daily as guideline for intensity in rehab;Long Term: Able to use THRR to govern intensity when exercising independently       Able to check pulse independently Yes       Intervention Provide education and demonstration on how to check pulse in carotid and radial arteries.;Review the importance of being able to check your own pulse for safety during independent exercise       Expected Outcomes Short Term: Able to explain why pulse checking is important during independent exercise;Long Term: Able to check pulse independently and accurately       Understanding of Exercise Prescription Yes       Intervention Provide education, explanation, and written materials on patient's individual exercise prescription       Expected Outcomes Short Term: Able to explain program exercise prescription;Long Term: Able to explain home exercise prescription to exercise independently                Exercise Goals Re-Evaluation :  Exercise Goals Re-Evaluation     Row Name 09/03/20 0949 09/23/20 1454 10/06/20 1424 10/08/20 0944 10/20/20 1547     Exercise Goal Re-Evaluation   Exercise Goals Review Increase Physical Activity;Able to understand and use rate of perceived exertion (RPE) scale;Knowledge and understanding of Target Heart Rate Range (THRR);Understanding of Exercise Prescription;Increase Strength and Stamina;Able to understand and use Dyspnea scale;Able to check pulse independently Increase Physical Activity;Increase Strength and Stamina;Understanding of Exercise Prescription Increase Physical Activity;Increase Strength and Stamina Increase Physical Activity;Increase Strength and Stamina Increase Physical Activity;Increase Strength and Stamina;Understanding of Exercise Prescription   Comments Reviewed RPE and dyspnea scales, THR and program prescription with pt today.   Pt voiced understanding and was given a copy of goals to take home. Lindwood is off to a good start in rehab.  He is up to 19 laps on the track. He did drop back to level 1 on the NuStep and we will try to get him back up to level 3.  We will continue to monitor his progress. Yuma has progressed to level 4 on NS.  He has also increased to 5 lb for strength work.  We will monitor progress. Reviewed home exercise with pt today.  [facility]  Reviewed THR, pulse, RPE, sign and symptoms, pulse oximetery and when to call 911 or MD.  Also discussed weather considerations and indoor options.  Pt voiced understanding. Hermilo has been doing well in rehab.  He is up to level 4 on the NuStep.  We will continue to monitor his progress.   Expected Outcomes Short: Use RPE daily to regulate  intensity. Long: Follow program prescription in THR. Short: Continue to attend rehab regularly Long: Conitnue to follow program prescription Short: reach 20 laps on track Long:  continue to build stamina Short:  work up to walking 30 min at home Long:  continue to build stamina Short: Continue to build up stamina on the track [place] to improve stamina    Row Name 11/05/20 0908 11/05/20 0929 11/18/20 1524 12/08/20 0924 12/17/20 0755     Exercise Goal Re-Evaluation   Exercise Goals Review Increase Physical Activity;Increase Strength and Stamina;Understanding of Exercise Prescription Increase Physical Activity;Increase Strength and Stamina;Understanding of Exercise Prescription Increase Physical Activity;Increase Strength and Stamina;Understanding of Exercise Prescription Increase Physical Activity;Increase Strength and Stamina Increase Physical Activity;Increase Strength and Stamina   Comments Karey has been progressing. O2 sats are maintaining above 88% and has increased his level on the Biostep to level 4. Will continue to monitor. Gavynn reports not doing much exercise at home right now. He  still plans on [facility] and he is walking (30-40 minutes/day - RPE 11) at home as well as some squats. He reports his O2 is 89-93 at home when exercising. Mika is doing well in rehab.  He is on level 4 on the NuStep and will walk intermittenly.  We will continue to monitor his progress. Today is Sledge first day back.  Oxygen and shortness of breath ae the same as before he was out.  We wiill monitor progress. Howie is still getting back into the swing of things as he is returning from having COVID. He did increase his overall number of laps to 23 on the track. Will continue to monitor.   Expected Outcomes Short: Continue to increase number of laps on track Long: Continue to increase overall MET level ST: continue to attend rehab, join the Y LT: continue to increase MET level Short: Continue to try to walk more Long: Continue to improve stamina Short: get back to regular attendance Long: build overall stamina Short: Continue to increase number of laps on track as tolerated Long: Continue to increase overall MET level    Row Name 12/24/20 0953 12/28/20 1403 01/11/21 1557 01/25/21 1319       Exercise Goal Re-Evaluation   Exercise Goals Review Increase Physical Activity;Increase Strength and Stamina;Understanding of Exercise Prescription Increase Physical Activity;Increase Strength and Stamina Increase Physical Activity;Increase Strength and Stamina;Understanding of Exercise Prescription Increase Physical Activity;Increase Strength and Stamina    Comments Abdurahman is doing well in rehab.  He is using his Cubii at home on his off days.  He is also planning to get some weights at home.  He also feels stronger overall. Neilan is getting started back after being out with Covid.  He has done 24 laps on the track.  We will continue to monitor progress. Shermaine improved his post walk by 250 ft!  He was able to return to his workloads after COVID.  He is now on level 5 for the NuStep.  We will continue to  monitor his progress toward graduation. Frazer plans to exercise at the [facility] when he completes LW.    Expected Outcomes Short: Get handweights for home use Long: Continue to improve stamina. Short:  continue to build walking strength Long:  improve overall MET level Short: Graduate Long: Continue to exercise independently Short: Graduate Long: Continue to exercise independently             Discharge Exercise Prescription (Final Exercise Prescription Changes):  Exercise Prescription Changes - 01/25/21  1300       Response to Exercise   Blood Pressure (Admit) 148/82    Blood Pressure (Exit) 122/72    Heart Rate (Admit) 80 bpm    Heart Rate (Exercise) 97 bpm    Heart Rate (Exit) 93 bpm    Oxygen Saturation (Admit) 92 %    Oxygen Saturation (Exercise) 90 %    Oxygen Saturation (Exit) 91 %    Rating of Perceived Exertion (Exercise) 13    Perceived Dyspnea (Exercise) 2    Symptoms SOB    Duration Continue with 30 min of aerobic exercise without signs/symptoms of physical distress.    Intensity THRR unchanged      Progression   Progression Continue to progress workloads to maintain intensity without signs/symptoms of physical distress.    Average METs 2.2      Resistance Training   Training Prescription Yes    Weight 5 lb    Reps 10-15      Interval Training   Interval Training No      Home Exercise Plan   Plans to continue exercise at [employer] (comment)   [facility]   Frequency Add 2 additional days to program exercise sessions.    Initial Home Exercises Provided 10/08/20             Nutrition:  Target Goals: Understanding of nutrition guidelines, daily intake of sodium <158m, cholesterol <2070m calories 30% from fat and 7% or less from saturated fats, daily to have 5 or more servings of fruits and vegetables.  Education: All About Nutrition: -Group instruction provided by verbal, written material, interactive activities, discussions, models, and posters to  present general guidelines for heart healthy nutrition including fat, fiber, MyPlate, the role of sodium in heart healthy nutrition, utilization of the nutrition label, and utilization of this knowledge for meal planning. Follow up email sent as well. Written material given at graduation. [facility]  Date 09/10/20  [facility]ator [facility] Review Code 1- Verbalizes Understanding       Biometrics:  Pre Biometrics - 08/31/20 1233       Pre Biometrics   Height 5' 10.75" (1.797 m)    Weight 271 lb 9.6 oz (123.2 kg)    BMI (Calculated) 38.15    Single Leg Stand 8.16 seconds             Post Biometrics - 01/07/21 1000        Post  Biometrics   Height 5' 10.75" (1.797 m)    Weight 272 lb 11.2 oz (123.7 kg)    BMI (Calculated) 38.31    Single Leg Stand 3.47 seconds             Nutrition Therapy Plan and Nutrition Goals:  Nutrition Therapy & Goals - 09/17/20 1146       Nutrition Therapy   RD appointment deferred Yes   Akoni does not want to meet with the RD at this time - will continue to check in.            Nutrition Assessments:  MEDIFICTS Score Key: ?70 Need to make dietary changes  40-70 Heart Healthy Diet ? 40 Therapeutic Level Cholesterol Diet  Flowsheet Row [facility]  Picture Your Plate Total Score on Admission 54  Picture Your Plate Total Score on Discharge 57      Picture Your Plate Scores: <4<88nhealthy dietary  pattern with much room for improvement. 41-50 Dietary pattern unlikely to meet recommendations for good health and room for improvement. 51-60 More healthful dietary pattern, with some room for improvement.  >60 Healthy dietary pattern, although there may be some specific behaviors that could be improved.   Nutrition Goals Re-Evaluation:  Nutrition Goals Re-Evaluation     Row Name 10/08/20 0933 11/05/20 0936  12/08/20 0929 12/24/20 1002       Goals   Nutrition Goal -- -- -- Healthy Eating    Comment Jada has not met with RD yet He reports doing well with his nutrition and his wife keeps him on track. Continues to defer nutrition consult. Roshaun has been out with covid - today is his first day back Ellison continues to decline a nutrtion appointment.  His wife does all the cooking and shopping and keeps a close eye on his diet.  He is working on portion control and feels hungry a lot.    Expected Outcome -- -- -- Continue to work on portion control and healthy eating.             Nutrition Goals Discharge (Final Nutrition Goals Re-Evaluation):  Nutrition Goals Re-Evaluation - 12/24/20 1002       Goals   Nutrition Goal Healthy Eating    Comment Heliodoro continues to decline a nutrtion appointment.  His wife does all the cooking and shopping and keeps a close eye on his diet.  He is working on portion control and feels hungry a lot.    Expected Outcome Continue to work on portion control and healthy eating.             Psychosocial: Target Goals: Acknowledge presence or absence of significant depression and/or stress, maximize coping skills, provide positive support system. Participant is able to verbalize types and ability to use techniques and skills needed for reducing stress and depression.   Education: Stress, Anxiety, and Depression - Group verbal and visual presentation to define topics covered.  Reviews how body is impacted by stress, anxiety, and depression.  Also discusses healthy ways to reduce stress and to treat/manage anxiety and depression.  Written material given at graduation. Flowsheet Row Pulmonary Rehab from 10/08/2020 in [facility] and Pulmonary Rehab  Date 10/08/20  Educator [facility]  Instruction Review Code 1- [employer] Understanding       Education: Sleep Hygiene -Provides group verbal and written instruction about how sleep can affect your health.  Define  sleep hygiene, discuss sleep cycles and impact of sleep habits. Review good sleep hygiene tips.    Initial Review & Psychosocial Screening:  Initial Psych Review & Screening - 08/28/20 1310       Initial Review   Current issues with Current Sleep Concerns;Current Stress Concerns   sleeps on and off all day; sleeps in bed from 3am-10am.   Source of Stress Concerns Unable to participate in former interests or hobbies;Unable to perform yard/household activities      [place]? Yes   wife   Concerns Inappropriate over/under dependence on family/friends      Barriers   Psychosocial barriers to participate in program There are no identifiable barriers or psychosocial needs.;The patient should benefit from training in stress management and relaxation.      Screening Interventions   Interventions Encouraged to exercise;Provide feedback about the scores to participant;To provide support and resources with identified psychosocial needs    Expected Outcomes Short Term goal: Utilizing psychosocial counselor, staff and  physician to assist with identification of specific Stressors or current issues interfering with healing process. Setting desired goal for each stressor or current issue identified.;Long Term Goal: Stressors or current issues are controlled or eliminated.;Short Term goal: Identification and review with participant of any Quality of Life or Depression concerns found by scoring the questionnaire.;Long Term goal: The participant improves quality of Life and PHQ9 Scores as seen by post scores and/or verbalization of changes             Quality of Life Scores:  Scores of 19 and below usually indicate a poorer quality of life in these areas.  A difference of  2-3 points is a clinically meaningful difference.  A difference of 2-3 points in the total score of the Quality of Life Index has been associated with significant improvement in overall quality of life,  self-image, physical symptoms, and general health in studies assessing change in quality of life.  PHQ-9: Recent Review Flowsheet Data     Depression screen [facility] 2/9 01/26/2021 08/31/2020 10/20/2015 10/16/2015 01/07/2015   Decreased Interest 1 1 0 0 0   Down, Depressed, Hopeless 0 0 0 0 0   PHQ - 2 Score 1 1 0 0 0   Altered sleeping 1 0 - - -   Tired, decreased energy 2 1 - - -   Change in appetite 2 0 - - -   Feeling bad or failure about yourself  0 0 - - -   Trouble concentrating 0 0 - - -   Moving slowly or fidgety/restless 0 0 - - -   Suicidal thoughts 0 0 - - -   PHQ-9 Score 6 2 - - -   Difficult doing work/chores Somewhat difficult Not difficult at all - - -      Interpretation of Total Score  Total Score Depression Severity:  1-4 = Minimal depression, 5-9 = Mild depression, 10-14 = Moderate depression, 15-19 = Moderately severe depression, 20-27 = Severe depression   Psychosocial Evaluation and Intervention:  Psychosocial Evaluation - 10/08/20 0937       Psychosocial Evaluation & Interventions   Comments Egbert is getting up and around more.  He feels like he can start mowing some on a riding mower.  We discussed heat concerns and not starting with too much time mowing etc.  He goes to sleep between 12 - 2 am and sleeps until 9 or 10 am.  He feels rested.    Expected Outcomes Short:  continue to exercise to build stamina Long: maintain good sleep habits             Psychosocial Re-Evaluation:  Psychosocial Re-Evaluation     Row Name 11/05/20 [phone] 12/08/20 2119 12/24/20 1003         Psychosocial Re-Evaluation   Current issues with None Identified None Identified None Identified     Comments He reports no stress at this time. He reports sleeping well when he goes to sleep, but will usually go to bed at 1am, he will wake up 7-9am (he feels well rested). He reports not having any hobbies that help him since breathing problems due to lack of energy - he used to fish and ran  a ballfield. He leans on his wife for support. Keimon reports no symptomsof anxiety or depression.  Sleep patterns are the same.  He can still rely on his wife for support Davy is doing well.  He feels better since recovering from [place].  He denies any  major stressors and sleeps well.     Expected Outcomes ST: continue to attend rehab and notify staff of any changes LT: maintain positive attitude Short: continue to exercise to help with stress and sleep Long: maintain positive outlook Short: continue to exercise for mental boost Long: maintain positive outlook     Interventions Encouraged to attend Pulmonary Rehabilitation for the exercise -- Encouraged to attend Pulmonary Rehabilitation for the exercise     Continue Psychosocial Services  Follow up required by staff -- Follow up required by staff              Psychosocial Discharge (Final Psychosocial Re-Evaluation):  Psychosocial Re-Evaluation - 12/24/20 1003       Psychosocial Re-Evaluation   Current issues with None Identified    Comments Deiondre is doing well.  He feels better since recovering from [place].  He denies any major stressors and sleeps well.    Expected Outcomes Short: continue to exercise for mental boost Long: maintain positive outlook    Interventions Encouraged to attend Pulmonary Rehabilitation for the exercise    Continue Psychosocial Services  Follow up required by staff             Education: Education Goals: Education classes will be provided on a weekly basis, covering required topics. Participant will state understanding/return demonstration of topics presented.  Learning Barriers/Preferences:  Learning Barriers/Preferences - 08/28/20 1305       Learning Barriers/Preferences   Learning Barriers Hearing    Learning Preferences None             General Pulmonary Education Topics:  Infection Prevention: - Provides verbal and written material to individual with discussion of infection control  including proper hand washing and proper equipment cleaning during exercise session. Flowsheet Row Pulmonary Rehab from 10/08/2020 in [facility] Pulmonary Rehab  Education need identified 08/31/20  Date 08/31/20  Educator [place]  Instruction Review Code 1- Verbalizes Understanding       Falls Prevention: - Provides verbal and written material to individual with discussion of falls prevention and safety. Flowsheet Row Pulmonary Rehab from 10/08/2020 in [facility] and Pulmonary Rehab  Education need identified 08/31/20  Date 08/31/20  Educator [place]  Instruction Review Code 1- Verbalizes Understanding       Chronic Lung Disease Review: - Group verbal instruction with posters, models, PowerPoint presentations and videos,  to review new updates, new respiratory medications, new advancements in procedures and treatments. Providing information on websites and "800" numbers for continued self-education. Includes information about supplement oxygen, available portable oxygen systems, continuous and intermittent flow rates, oxygen safety, concentrators, and Medicare reimbursement for oxygen. Explanation of Pulmonary Drugs, including class, frequency, complications, importance of spacers, rinsing mouth after steroid MDI's, and proper cleaning methods for nebulizers. Review of basic lung anatomy and physiology related to function, structure, and complications of lung disease. Review of risk factors. Discussion about methods for diagnosing sleep apnea and types of masks and machines for OSA. Includes a review of the use of types of environmental controls: home humidity, furnaces, filters, dust mite/pet prevention, HEPA vacuums. Discussion about weather changes, air quality and the benefits of nasal washing. Instruction on Warning signs, infection symptoms, calling MD promptly, preventive modes, and value of vaccinations. Review of effective airway clearance, coughing and/or vibration techniques. Emphasizing  that all should Create an Action Plan. Written material given at graduation. Flowsheet Row Pulmonary Rehab from 10/08/2020 in [facility] Cardiac and Pulmonary Rehab  Date 10/01/20  Educator [facility]  Instruction  Review Code 1- Verbalizes Understanding       AED/CPR: - Group verbal and written instruction with the use of models to demonstrate the basic use of the AED with the basic ABC's of resuscitation.    Anatomy and Cardiac Procedures: - Group verbal and visual presentation and models provide information about basic cardiac anatomy and function. Reviews the testing methods done to diagnose heart disease and the outcomes of the test results. Describes the treatment choices: Medical Management, Angioplasty, or Coronary Bypass Surgery for treating various heart conditions including Myocardial Infarction, Angina, Valve Disease, and Cardiac Arrhythmias.  Written material given at graduation.   Medication Safety: - Group verbal and visual instruction to review commonly prescribed medications for heart and lung disease. Reviews the medication, class of the drug, and side effects. Includes the steps to properly store meds and maintain the prescription regimen.  Written material given at graduation. Flowsheet Row Pulmonary Rehab from 10/08/2020 in [facility] Cardiac and Pulmonary Rehab  Date 09/17/20  Educator [place]  Instruction Review Code 1- Verbalizes Understanding       Other: -Provides group and verbal instruction on various topics (see comments)   Knowledge Questionnaire Score:  Knowledge Questionnaire Score - 01/26/21 1033       Knowledge Questionnaire Score   Pre Score 16/18: Oxygen, Lung disease    Post Score 17/18              Core Components/Risk Factors/Patient Goals at Admission:  Personal Goals and Risk Factors at Admission - 08/31/20 1255       Core Components/Risk Factors/Patient Goals on Admission    Weight Management Yes;Weight Loss    Intervention Weight Management: Develop a  combined nutrition and exercise program designed to reach desired caloric intake, while maintaining appropriate intake of nutrient and fiber, sodium and fats, and appropriate energy expenditure required for the weight goal.;Weight Management: Provide education and appropriate resources to help participant work on and attain dietary goals.;Weight Management/Obesity: Establish reasonable short term and long term weight goals.    Admit Weight 271 lb (122.9 kg)    Goal Weight: Short Term 265 lb (120.2 kg)    Goal Weight: Long Term 250 lb (113.4 kg)    Expected Outcomes Short Term: Continue to assess and modify interventions until short term weight is achieved;Long Term: Adherence to nutrition and physical activity/exercise program aimed toward attainment of established weight goal;Weight Loss: Understanding of general recommendations for a balanced deficit meal plan, which promotes 1-2 lb weight loss per week and includes a negative energy balance of [phone] kcal/d;Understanding recommendations for meals to include 15-35% energy as protein, 25-35% energy from fat, 35-60% energy from carbohydrates, less than 212m of dietary cholesterol, 20-35 gm of total fiber daily;Understanding of distribution of calorie intake throughout the day with the consumption of 4-5 meals/snacks    Improve shortness of breath with ADL's Yes    Intervention Provide education, individualized exercise plan and daily activity instruction to help decrease symptoms of SOB with activities of daily living.    Expected Outcomes Short Term: Improve cardiorespiratory fitness to achieve a reduction of symptoms when performing ADLs;Long Term: Be able to perform more ADLs without symptoms or delay the onset of symptoms    Hypertension Yes    Intervention Provide education on lifestyle modifcations including regular physical activity/exercise, weight management, moderate sodium restriction and increased consumption of fresh fruit, vegetables, and  low fat dairy, alcohol moderation, and smoking cessation.;Monitor prescription use compliance.    Expected Outcomes Short Term:  Continued assessment and intervention until BP is < 140/90m HG in hypertensive participants. < 130/857mHG in hypertensive participants with diabetes, heart failure or chronic kidney disease.;Long Term: Maintenance of blood pressure at goal levels.             Education:Diabetes - Individual verbal and written instruction to review signs/symptoms of diabetes, desired ranges of glucose level fasting, after meals and with exercise. Acknowledge that pre and post exercise glucose checks will be done for 3 sessions at entry of program.   Know Your Numbers and Heart Failure: - Group verbal and visual instruction to discuss disease risk factors for cardiac and pulmonary disease and treatment options.  Reviews associated critical values for Overweight/Obesity, Hypertension, Cholesterol, and Diabetes.  Discusses basics of heart failure: signs/symptoms and treatments.  Introduces Heart Failure Zone chart for action plan for heart failure.  Written material given at graduation. Flowsheet [facility]  Date 09/24/20  Educator SB  Instruction Review Code 1- Verbalizes Understanding       Core Components/Risk Factors/Patient Goals Review:   Goals and Risk Factor Review     Row Name 10/08/20 [id number]/07/22 0934 12/08/20 0918 12/24/20 0955       Core Components/Risk Factors/Patient Goals Review   Personal Goals Review Weight Management/Obesity;Improve shortness of breath with ADL's Weight Management/Obesity;Improve shortness of breath with ADL's Weight Management/Obesity;Improve shortness of breath with ADL's Weight Management/Obesity;Improve shortness of breath with ADL's;Hypertension;Increase knowledge of respiratory medications and ability to use respiratory devices properly.    Review Richards weight is down 3-4 lb  since he started.  He is walking at home as well.  He feels like he can get out and mow the grass some now. (riding mower) He continues to practice PLB and he notices his O2 will come back up. When exercising his O2 is about 89-93 at home. He feels that his breathing has improved overall and now he can do more things at home (ADLs). He still has good and bad days. He reports his weight has been stable. He is taking all his medications as directed and tolerating them well. Krish has been out with Covid.  He feels his breathing is about the same.  Today is his first day back.  He is taking all meds as directed. RiVincens doing well in rehab. His weight is staying steady. His blood pressures are holding steady and he does check them at home. He continues have a hard time breathing. He is good about using his inhalers.  He does not use his nebulizer daily.  We talked about adding nebulizer daily to help more with breathing.  I also gave him a spacer for his inhaler.    Expected Outcomes Short: continue to exercise consistently and monitor weight Long:  reach goal weight Short: continue to exercise consistently and monitor weight Long: reach goal weight Short: get back to consistent attendance Long:  continue to work on weight Short: Start add in nebulizer and use spacer Long: Continue to work on [employer]             Core Components/Risk Factors/Patient Goals at Discharge (Final Review):   Goals and Risk Factor Review - 12/24/20 0955       Core Components/Risk Factors/Patient Goals Review   Personal Goals Review Weight Management/Obesity;Improve shortness of breath with ADL's;Hypertension;Increase knowledge of respiratory medications and ability to use respiratory devices properly.    Review RiHaddens doing well in rehab.  His weight is staying steady. His blood pressures are holding steady and he does check them at home. He continues have a hard time breathing. He is good about using his inhalers.   He does not use his nebulizer daily.  We talked about adding nebulizer daily to help more with breathing.  I also gave him a spacer for his inhaler.    Expected Outcomes Short: Start add in nebulizer and use spacer Long: Continue to work on weight loss             ITP Comments:  ITP Comments     Row Name 08/28/20 1320 08/31/20 1238 09/03/20 0938 09/16/20 0708 10/14/20 0805   ITP Comments Initial telephone orientation completed. Diagnosis can be found in [facility] 3/17. EP orientation scheduled for 5/2 at 10:30am. Completed 6MWT and gym orientation. Initial ITP created and sent for review to Dr. Emily Filbert, Medical Director. First full day of exercise!  Patient was oriented to gym and equipment including functions, settings, policies, and procedures.  Patient's individual exercise prescription and treatment plan were reviewed.  All starting workloads were established based on the results of the 6 minute walk test done at initial orientation visit.  The plan for exercise progression was also introduced and progression will be customized based on patient's performance and goals. 30 Day review completed. Medical Director ITP review done, changes made as directed, and signed approval by Medical Director.  New to program 30 Day review completed. Medical Director ITP review done, changes made as directed, and signed approval by Medical Director.    [place] Name 11/11/20 0847 11/20/20 1328 12/03/20 1136 12/09/20 0730 01/06/21 0629   ITP Comments 30 Day review completed. Medical Director ITP review done, changes made as directed, and signed approval by Medical Director. Toddy has tested positive for COVID and will be out at least the next 2 weeks [name] has not attended since last exercise review. 30 Day review completed. Medical Director ITP review done, changes made as directed, and signed approval by Medical Director. 30 Day review completed. Medical Director ITP review done, changes made as directed, and signed  approval by Medical Director.    Rit[place]y Name 01/26/21 0926           ITP Comments Maxime graduated today from  rehab with 36 sessions completed.  Details of the patient's exercise prescription and what He needs to do in order to continue the prescription and progress were discussed with patient.  Patient was given a copy of prescription and goals.  Patient verbalized understanding.  Colm plans to continue to exercise by going to the [facility].                Comments: discharge ITP

## 2021-03-07 ENCOUNTER — Inpatient Hospital Stay
Admission: EM | Admit: 2021-03-07 | Discharge: 2021-03-13 | DRG: 871 | Disposition: A | Discharge status: Home / Self Care | Payer: No Typology Code available for payment source | Attending: Internal Medicine | Admitting: Internal Medicine

## 2021-03-07 ENCOUNTER — Other Ambulatory Visit: Payer: Self-pay

## 2021-03-07 ENCOUNTER — Encounter: Payer: Self-pay | Admitting: Emergency Medicine

## 2021-03-07 ENCOUNTER — Emergency Department: Payer: No Typology Code available for payment source

## 2021-03-07 DIAGNOSIS — J9601 Acute respiratory failure with hypoxia: Secondary | ICD-10-CM | POA: Diagnosis present

## 2021-03-07 DIAGNOSIS — Z85118 Personal history of other malignant neoplasm of bronchus and lung: Secondary | ICD-10-CM | POA: Diagnosis not present

## 2021-03-07 DIAGNOSIS — E038 Other specified hypothyroidism: Secondary | ICD-10-CM | POA: Diagnosis not present

## 2021-03-07 DIAGNOSIS — Z7189 Other specified counseling: Secondary | ICD-10-CM

## 2021-03-07 DIAGNOSIS — I1310 Hypertensive heart and chronic kidney disease without heart failure, with stage 1 through stage 4 chronic kidney disease, or unspecified chronic kidney disease: Secondary | ICD-10-CM | POA: Diagnosis present

## 2021-03-07 DIAGNOSIS — Z79890 Hormone replacement therapy (postmenopausal): Secondary | ICD-10-CM

## 2021-03-07 DIAGNOSIS — J189 Pneumonia, unspecified organism: Secondary | ICD-10-CM | POA: Diagnosis present

## 2021-03-07 DIAGNOSIS — E039 Hypothyroidism, unspecified: Secondary | ICD-10-CM | POA: Diagnosis present

## 2021-03-07 DIAGNOSIS — J441 Chronic obstructive pulmonary disease with (acute) exacerbation: Secondary | ICD-10-CM | POA: Diagnosis not present

## 2021-03-07 DIAGNOSIS — I480 Paroxysmal atrial fibrillation: Secondary | ICD-10-CM | POA: Diagnosis present

## 2021-03-07 DIAGNOSIS — J4520 Mild intermittent asthma, uncomplicated: Secondary | ICD-10-CM | POA: Diagnosis not present

## 2021-03-07 DIAGNOSIS — H9190 Unspecified hearing loss, unspecified ear: Secondary | ICD-10-CM | POA: Diagnosis present

## 2021-03-07 DIAGNOSIS — E871 Hypo-osmolality and hyponatremia: Secondary | ICD-10-CM | POA: Diagnosis present

## 2021-03-07 DIAGNOSIS — Z79899 Other long term (current) drug therapy: Secondary | ICD-10-CM | POA: Diagnosis not present

## 2021-03-07 DIAGNOSIS — Z7951 Long term (current) use of inhaled steroids: Secondary | ICD-10-CM

## 2021-03-07 DIAGNOSIS — N400 Enlarged prostate without lower urinary tract symptoms: Secondary | ICD-10-CM | POA: Diagnosis present

## 2021-03-07 DIAGNOSIS — E1122 Type 2 diabetes mellitus with diabetic chronic kidney disease: Secondary | ICD-10-CM | POA: Diagnosis present

## 2021-03-07 DIAGNOSIS — R6520 Severe sepsis without septic shock: Secondary | ICD-10-CM | POA: Diagnosis not present

## 2021-03-07 DIAGNOSIS — Z86006 Personal history of melanoma in-situ: Secondary | ICD-10-CM

## 2021-03-07 DIAGNOSIS — Z87891 Personal history of nicotine dependence: Secondary | ICD-10-CM | POA: Diagnosis not present

## 2021-03-07 DIAGNOSIS — Z20822 Contact with and (suspected) exposure to covid-19: Secondary | ICD-10-CM | POA: Diagnosis present

## 2021-03-07 DIAGNOSIS — Z7901 Long term (current) use of anticoagulants: Secondary | ICD-10-CM | POA: Diagnosis not present

## 2021-03-07 DIAGNOSIS — A419 Sepsis, unspecified organism: Secondary | ICD-10-CM | POA: Diagnosis present

## 2021-03-07 DIAGNOSIS — I440 Atrioventricular block, first degree: Secondary | ICD-10-CM | POA: Diagnosis present

## 2021-03-07 DIAGNOSIS — N1831 Chronic kidney disease, stage 3a: Secondary | ICD-10-CM | POA: Diagnosis present

## 2021-03-07 DIAGNOSIS — E1165 Type 2 diabetes mellitus with hyperglycemia: Secondary | ICD-10-CM | POA: Diagnosis present

## 2021-03-07 DIAGNOSIS — N183 Chronic kidney disease, stage 3 unspecified: Secondary | ICD-10-CM | POA: Diagnosis present

## 2021-03-07 LAB — URINALYSIS, COMPLETE (UACMP) WITH MICROSCOPIC
Bacteria, UA: NONE SEEN
Bilirubin Urine: NEGATIVE
Glucose, UA: NEGATIVE mg/dL
Hgb urine dipstick: NEGATIVE
Ketones, ur: NEGATIVE mg/dL
Leukocytes,Ua: NEGATIVE
Nitrite: NEGATIVE
Protein, ur: NEGATIVE mg/dL
Specific Gravity, Urine: 1.024 (ref 1.005–1.030)
pH: 5 (ref 5.0–8.0)

## 2021-03-07 LAB — CBC
HCT: 49.4 % (ref 39.0–52.0)
Hemoglobin: 16.4 g/dL (ref 13.0–17.0)
MCH: 31.3 pg (ref 26.0–34.0)
MCHC: 33.2 g/dL (ref 30.0–36.0)
MCV: 94.3 fL (ref 80.0–100.0)
Platelets: 168 10*3/uL (ref 150–400)
RBC: 5.24 MIL/uL (ref 4.22–5.81)
RDW: 14 % (ref 11.5–15.5)
WBC: 12 10*3/uL — ABNORMAL HIGH (ref 4.0–10.5)
nRBC: 0 % (ref 0.0–0.2)

## 2021-03-07 LAB — PROCALCITONIN: Procalcitonin: 0.12 ng/mL

## 2021-03-07 LAB — BASIC METABOLIC PANEL
Anion gap: 8 (ref 5–15)
BUN: 19 mg/dL (ref 8–23)
CO2: 20 mmol/L — ABNORMAL LOW (ref 22–32)
Calcium: 8.9 mg/dL (ref 8.9–10.3)
Chloride: 107 mmol/L (ref 98–111)
Creatinine, Ser: 1.5 mg/dL — ABNORMAL HIGH (ref 0.61–1.24)
GFR, Estimated: 47 mL/min — ABNORMAL LOW (ref >60)
Glucose, Bld: 209 mg/dL — ABNORMAL HIGH (ref 70–99)
Potassium: 4.7 mmol/L (ref 3.5–5.1)
Sodium: 135 mmol/L (ref 135–145)

## 2021-03-07 LAB — TROPONIN I (HIGH SENSITIVITY)
Troponin I (High Sensitivity): 11 ng/L (ref <18)
Troponin I (High Sensitivity): 9 ng/L (ref <18)

## 2021-03-07 LAB — RESP PANEL BY RT-PCR (FLU A&B, COVID) ARPGX2
Influenza A by PCR: NEGATIVE
Influenza B by PCR: NEGATIVE
SARS Coronavirus 2 by RT PCR: NEGATIVE

## 2021-03-07 LAB — LACTIC ACID, PLASMA: Lactic Acid, Venous: 1.8 mmol/L (ref 0.5–1.9)

## 2021-03-07 LAB — PROTIME-INR
INR: 1.2 (ref 0.8–1.2)
Prothrombin Time: 15.5 seconds — ABNORMAL HIGH (ref 11.4–15.2)

## 2021-03-07 LAB — BRAIN NATRIURETIC PEPTIDE: B Natriuretic Peptide: 49.6 pg/mL (ref 0.0–100.0)

## 2021-03-07 MED ORDER — APIXABAN 5 MG PO TABS
5.0000 mg | ORAL_TABLET | Freq: Two times a day (BID) | ORAL | Status: DC
  Administered 2021-03-08 – 2021-03-13 (×11): 5 mg via ORAL
  Filled 2021-03-07 (×11): qty 1

## 2021-03-07 MED ORDER — ONDANSETRON HCL 4 MG/2ML IJ SOLN: 4.0000 mg | Freq: Three times a day (TID) | INTRAMUSCULAR | Status: DC | PRN

## 2021-03-07 MED ORDER — METHYLPREDNISOLONE SODIUM SUCC 125 MG IJ SOLR
125.0000 mg | INTRAMUSCULAR | Status: AC
  Administered 2021-03-07: 125 mg via INTRAVENOUS
  Filled 2021-03-07: qty 2

## 2021-03-07 MED ORDER — LEVOTHYROXINE SODIUM 137 MCG PO TABS
137.0000 ug | ORAL_TABLET | Freq: Every day | ORAL | Status: DC
Start: 2021-03-08 — End: 2021-03-13
  Administered 2021-03-08 – 2021-03-13 (×6): 137 ug via ORAL
  Filled 2021-03-07 (×6): qty 1

## 2021-03-07 MED ORDER — IPRATROPIUM-ALBUTEROL 0.5-2.5 (3) MG/3ML IN SOLN
3.0000 mL | Freq: Once | RESPIRATORY_TRACT | Status: AC
  Administered 2021-03-07: 3 mL via RESPIRATORY_TRACT

## 2021-03-07 MED ORDER — ALPRAZOLAM 0.5 MG PO TABS
0.5000 mg | ORAL_TABLET | Freq: Every evening | ORAL | Status: DC | PRN
  Filled 2021-03-07: qty 1

## 2021-03-07 MED ORDER — IPRATROPIUM-ALBUTEROL 0.5-2.5 (3) MG/3ML IN SOLN
3.0000 mL | RESPIRATORY_TRACT | Status: DC
  Administered 2021-03-07 – 2021-03-10 (×13): 3 mL via RESPIRATORY_TRACT
  Filled 2021-03-07 (×12): qty 3

## 2021-03-07 MED ORDER — TAMSULOSIN HCL 0.4 MG PO CAPS
0.4000 mg | ORAL_CAPSULE | Freq: Every day | ORAL | Status: DC
  Administered 2021-03-08 – 2021-03-10 (×3): 0.4 mg via ORAL
  Filled 2021-03-07 (×3): qty 1

## 2021-03-07 MED ORDER — DILTIAZEM HCL ER 60 MG PO CP12
120.0000 mg | ORAL_CAPSULE | Freq: Every day | ORAL | Status: DC
  Administered 2021-03-08 – 2021-03-11 (×4): 120 mg via ORAL
  Filled 2021-03-07 (×5): qty 2

## 2021-03-07 MED ORDER — ALBUTEROL SULFATE (2.5 MG/3ML) 0.083% IN NEBU: 2.5000 mg | INHALATION_SOLUTION | RESPIRATORY_TRACT | Status: DC | PRN

## 2021-03-07 MED ORDER — SODIUM CHLORIDE 0.9 % IV SOLN
2.0000 g | INTRAVENOUS | Status: AC
  Administered 2021-03-07 – 2021-03-11 (×5): 2 g via INTRAVENOUS
  Filled 2021-03-07: qty 20
  Filled 2021-03-07 (×4): qty 2

## 2021-03-07 MED ORDER — MOMETASONE FURO-FORMOTEROL FUM 200-5 MCG/ACT IN AERO
2.0000 | INHALATION_SPRAY | Freq: Two times a day (BID) | RESPIRATORY_TRACT | Status: DC
  Administered 2021-03-08 – 2021-03-13 (×11): 2 via RESPIRATORY_TRACT
  Filled 2021-03-07: qty 8.8

## 2021-03-07 MED ORDER — SODIUM CHLORIDE 0.9 % IV SOLN
500.0000 mg | INTRAVENOUS | Status: DC
  Administered 2021-03-07 – 2021-03-09 (×3): 500 mg via INTRAVENOUS
  Filled 2021-03-07 (×4): qty 500

## 2021-03-07 MED ORDER — LACTATED RINGERS IV BOLUS
1000.0000 mL | Freq: Once | INTRAVENOUS | Status: AC
  Administered 2021-03-07: 1000 mL via INTRAVENOUS

## 2021-03-07 MED ORDER — METHYLPREDNISOLONE SODIUM SUCC 125 MG IJ SOLR
60.0000 mg | Freq: Two times a day (BID) | INTRAMUSCULAR | Status: DC
  Administered 2021-03-08 – 2021-03-09 (×3): 60 mg via INTRAVENOUS
  Filled 2021-03-07 (×3): qty 2

## 2021-03-07 MED ORDER — ACETAMINOPHEN 325 MG PO TABS: 650.0000 mg | ORAL_TABLET | Freq: Four times a day (QID) | ORAL | Status: DC | PRN

## 2021-03-07 MED ORDER — DM-GUAIFENESIN ER 30-600 MG PO TB12
1.0000 | ORAL_TABLET | Freq: Two times a day (BID) | ORAL | Status: DC | PRN
  Administered 2021-03-08: 20:00:00 1 via ORAL
  Filled 2021-03-07: qty 1

## 2021-03-07 MED ORDER — LACTATED RINGERS IV SOLN: INTRAVENOUS | Status: DC

## 2021-03-07 MED ORDER — VITAMIN B-12 1000 MCG PO TABS
1000.0000 ug | ORAL_TABLET | Freq: Every day | ORAL | Status: DC
  Administered 2021-03-08 – 2021-03-13 (×6): 1000 ug via ORAL
  Filled 2021-03-07 (×7): qty 1

## 2021-03-07 MED ORDER — METHYLPREDNISOLONE SODIUM SUCC 125 MG IJ SOLR: 125.0000 mg | Freq: Once | INTRAMUSCULAR | 0 refills | Status: DC

## 2021-03-07 NOTE — Sepsis Progress Note (Signed)
Elink is following this code sepsis ?

## 2021-03-07 NOTE — ED Notes (Signed)
Pharmacy will send missing duoneb.

## 2021-03-07 NOTE — ED Notes (Addendum)
Pt to ED for shortness of breath since Friday, pt has also had fever and chest congestion. Pt SpO2 89%in room air, pt does not wear O2 at home.

## 2021-03-07 NOTE — ED Notes (Signed)
Called patient's wife to notify of patient's room number. Patient awaiting transport to his room. NAD noted

## 2021-03-07 NOTE — ED Notes (Signed)
SPO2 still 88%. Turned oxygen to 6L nasal cannula. Pt sitting high fowlers.

## 2021-03-07 NOTE — ED Triage Notes (Signed)
Pts family reports that pt has had some congestion and has not been able to sleep or get a deep breath over the last few days. He has a productive cough with brown sputum. He does not usually wear O2 at home but is requiring it here in the ED. He gets very [facility] when trying to walk around or do anything at home.

## 2021-03-07 NOTE — ED Notes (Signed)
Oxygen turned to 4L from 2L. SPO2 was 87%.

## 2021-03-07 NOTE — Progress Notes (Signed)
CODE SEPSIS - PHARMACY COMMUNICATION  **Broad Spectrum Antibiotics should be administered within 1 hour of Sepsis diagnosis**  Time Code Sepsis Called/Page Received: 1314  Antibiotics Ordered: azithromycin and ceftriaxone  Time of 1st antibiotic administration: [place] ,PharmD Clinical Pharmacist  03/07/2021  3:26 PM

## 2021-03-07 NOTE — ED Notes (Signed)
Report received from [place], [place]

## 2021-03-07 NOTE — ED Notes (Signed)
Sent msg to Dr Blaine Hamper re: d/c second lactic and can covid test be changed to PCR which was already sent to lab, rather than covid POC.

## 2021-03-07 NOTE — H&P (Addendum)
Call sputum production.  Has fever and chills.  Patient has a chest congestion  History and Physical    Levi [name] DOB: 1940-11-08 DOA: 03/07/2021  Referring MD/NP/PA:   PCP: [place]   Patient coming from:  The patient is coming from home.   Chief Complaint: SOB  HPI: Levi [name] is a 80 y.o. male with medical history significant of former smoker, COPD, asthma, hypothyroidism, anxiety, squamous cell lung cancer, BPH, PAF on Eliquis, CKD-3A, who presents with shortness of breath.  Patient states that he has short of breath for several days, which has been progressively worsening.  He also has productive cough with brownish.  Patient does not have chest pain at rest, but states that coughing induces some front chest pain.  Patient has fever and chills.  No nausea, vomiting, diarrhea or abdominal pain.  No symptoms of UTI. He is not wearing oxygen normally at home, but was found to have oxygen desaturation to 89% on room air, which improved to 95% on 2 L oxygen in ED.   ED Course: pt was found to have WBC 12.0, INR 1.2, pending COVID PCR, stable renal function, troponin level 11 --> 9, temperature 100.7, blood pressure 130/91, heart rate 111, RR 20, chest x-ray showed COPD and left upper lobe infiltration.  Patient is admitted to [facility] bed as inpatient.   Review of Systems:   General: has fevers, chills, no body weight gain,  has fatigue HEENT: no blurry vision, hearing changes or sore throat Respiratory: has dyspnea, coughing, no wheezing CV: has chest pain, no palpitations GI: no nausea, vomiting, abdominal pain, diarrhea, constipation GU: no dysuria, burning on urination, increased urinary frequency, hematuria  Ext: no leg edema Neuro: no unilateral weakness, numbness, or tingling, no vision change or hearing loss Skin: no rash, no skin tear. MSK: No muscle spasm, no deformity, no limitation of range of movement in spin Heme: No easy bruising.   Travel history: No recent long distant travel.  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[place])    Prostate enlargement    Shortness of breath    Squamous cell carcinoma of lung, left ([place])    Thyroid disease    Tobacco abuse     Past Surgical History:  Procedure Laterality Date   ELECTROMAGNETIC NAVIGATION BROCHOSCOPY Left 10/19/2018   Procedure: ELECTROMAGNETIC NAVIGATION BRONCHOSCOPY LEFT;  Surgeon: Tyler Pita, MD;  Location: [facility] ORS;  Service: Cardiopulmonary;  Laterality: Left;   ENDOBRONCHIAL ULTRASOUND Left 10/19/2018   Procedure: ENDOBRONCHIAL ULTRASOUND LEFT;  Surgeon: Tyler Pita, MD;  Location: [facility] ORS;  Service: Cardiopulmonary;  Laterality: Left;   MELANOMA EXCISION Left    NO PAST SURGERIES      Social History:  reports that he quit smoking about 2 years ago. His smoking use included cigarettes. He has a 15.50 pack-year smoking history. He has never used smokeless tobacco. He reports that he does not drink alcohol and does not use drugs.  Family History:  Family History  Problem Relation Age of Onset   Aneurysm Mother    Cancer Father      Prior to Admission medications   Medication Sig Start Date End Date Taking? Authorizing Provider  albuterol (PROVENTIL HFA;VENTOLIN  HFA) 108 (90 Base) MCG/ACT inhaler Inhale 2 puffs into the lungs every 6 (six) hours as needed for wheezing or shortness of breath. 10/16/15   Shawnee Knapp, MD  apixaban (ELIQUIS) 5 MG TABS tablet Take 1 tablet (5 mg total) by mouth 2 (two) times daily. 04/02/19 01/17/20  Kate Sable, MD  budesonide-formoterol (SYMBICORT) 160-4.5 MCG/ACT inhaler Inhale 2 puffs into the lungs 2 (two) times daily.    [provider]  diltiazem (DILACOR XR) 120 MG 24 hr capsule Take 120 mg by mouth  daily.    [provider]  levothyroxine (SYNTHROID) 137 MCG tablet Take 137 mcg by mouth daily before breakfast.    [provider]  tamsulosin (FLOMAX) 0.4 MG CAPS capsule Take 0.4 mg by mouth daily.  09/18/18   [provider]  vitamin B-12 (CYANOCOBALAMIN) 1000 MCG tablet Take 1,000 mcg by mouth daily.    [provider]  Vitamin D, Ergocalciferol, (DRISDOL) 1.25 MG (50000 UT) CAPS capsule Take 50,000 Units by mouth every 7 (seven) days.    [provider]    Physical Exam: Vitals:   03/07/21 1545 03/07/21 1600 03/07/21 1630 03/07/21 1717  BP:  128/75 134/77 123/65  Pulse: 91 88 88 (!) 101  Resp: (!) 27 (!) 27 (!) 27 (!) 32  Temp:      TempSrc:      SpO2: 93% 92% 91% 94%  Weight:      Height:       General: Not in acute distress HEENT:       Eyes: PERRL, EOMI, no scleral icterus.       ENT: No discharge from the ears and nose, no pharynx injection, no tonsillar enlargement.        Neck: No JVD, no bruit, no mass felt. Heme: No neck lymph node enlargement. Cardiac: S1/S2, RRR, No murmurs, No gallops or rubs. Respiratory: Has crackles bilaterally, has decreased air movement bilaterally GI: Soft, nondistended, nontender, no rebound pain, no organomegaly, BS present. GU: No hematuria Ext: No pitting leg edema bilaterally. 1+DP/PT pulse bilaterally. Musculoskeletal: No joint deformities, No joint redness or warmth, no limitation of ROM in spin. Skin: No rashes.  Neuro: Alert, oriented X3, cranial nerves II-XII grossly intact, moves all extremities normally.  Psych: Patient is not psychotic, no suicidal or hemocidal ideation.  Labs on Admission: I have personally reviewed following labs and imaging studies  CBC: Recent Labs  Lab 03/07/21 1034  WBC 12.0*  HGB 16.4  HCT 49.4  MCV 94.3  PLT 242   Basic Metabolic Panel: Recent Labs  Lab 03/07/21 1034  NA 135  K 4.7  CL 107  CO2 20*  GLUCOSE 209*  BUN 19  CREATININE 1.50*   CALCIUM 8.9   GFR: Estimated Creatinine Clearance: 54.1 mL/min (A) (by C-G formula based on SCr of 1.5 mg/dL (H)). Liver Function Tests: No results for input(s): AST, ALT, ALKPHOS, BILITOT, PROT, ALBUMIN in the last 168 hours. No results for input(s): LIPASE, AMYLASE in the last 168 hours. No results for input(s): AMMONIA in the last 168 hours. Coagulation Profile: Recent Labs  Lab 03/07/21 1034  INR 1.2   Cardiac Enzymes: No results for input(s): CKTOTAL, CKMB, CKMBINDEX, TROPONINI in the last 168 hours. BNP (last 3 results) No results for input(s): PROBNP in the last 8760 hours. HbA1C: No results for input(s): HGBA1C in the last 72 hours. CBG: No results for input(s): GLUCAP in the last 168 hours. Lipid Profile: No results for input(s): CHOL,  HDL, LDLCALC, TRIG, CHOLHDL, LDLDIRECT in the last 72 hours. Thyroid Function Tests: No results for input(s): TSH, T4TOTAL, FREET4, T3FREE, THYROIDAB in the last 72 hours. Anemia Panel: No results for input(s): VITAMINB12, FOLATE, FERRITIN, TIBC, IRON, RETICCTPCT in the last 72 hours. Urine analysis:    Component Value Date/Time   COLORURINE YELLOW (A) 03/30/2019 1922   APPEARANCEUR HAZY (A) 03/30/2019 1922   LABSPEC 1.017 03/30/2019 1922   PHURINE 5.0 03/30/2019 1922   GLUCOSEU NEGATIVE 03/30/2019 1922   HGBUR NEGATIVE 03/30/2019 1922   BILIRUBINUR NEGATIVE 03/30/2019 1922   BILIRUBINUR neg 12/26/2014 [place] 03/30/2019 1922   PROTEINUR NEGATIVE 03/30/2019 1922   UROBILINOGEN 0.2 12/26/2014 1228   NITRITE NEGATIVE 03/30/2019 1922   LEUKOCYTESUR NEGATIVE 03/30/2019 1922   Sepsis Labs: @LABRCNTIP (procalcitonin:4,lacticidven:4) ) Recent Results (from the past 240 hour(s))  Resp Panel by RT-PCR (Flu A&B, Covid) Nasopharyngeal Swab     Status: None   Collection Time: 03/07/21  3:33 PM   Specimen: Nasopharyngeal Swab; Nasopharyngeal(NP) swabs in vial transport medium  Result Value Ref Range Status   SARS  Coronavirus 2 by RT PCR NEGATIVE NEGATIVE Final    Comment: (NOTE) SARS-CoV-2 target nucleic acids are NOT DETECTED.  The SARS-CoV-2 RNA is generally detectable in upper respiratory specimens during the acute phase of infection. The lowest concentration of SARS-CoV-2 viral copies this assay can detect is 138 copies/mL. A negative result does not preclude SARS-Cov-2 infection and should not be used as the sole basis for treatment or other patient management decisions. A negative result may occur with  improper specimen collection/handling, submission of specimen other than nasopharyngeal swab, presence of viral mutation(s) within the areas targeted by this assay, and inadequate number of viral copies(<138 copies/mL). A negative result must be combined with clinical observations, patient history, and epidemiological information. The expected result is Negative.  Fact Sheet for Patients:  EntrepreneurPulse.com.au  Fact Sheet for Healthcare Providers:  IncredibleEmployment.be  This test is no t yet approved or cleared by the [place] FDA and  has been authorized for detection and/or diagnosis of SARS-CoV-2 by FDA under an Emergency Use Authorization (EUA). This EUA will remain  in effect (meaning this test can be used) for the duration of the COVID-19 declaration under Section 564(b)(1) of the Act, 21 U.S.C.section 360bbb-3(b)(1), unless the authorization is terminated  or revoked sooner.       Influenza A by PCR NEGATIVE NEGATIVE Final   Influenza B by PCR NEGATIVE NEGATIVE Final    Comment: (NOTE) The Xpert Xpress SARS-CoV-2/FLU/RSV plus assay is intended as an aid in the diagnosis of influenza from Nasopharyngeal swab specimens and should not be used as a sole basis for treatment. Nasal washings and aspirates are unacceptable for Xpert Xpress SARS-CoV-2/FLU/RSV testing.  Fact Sheet for  Patients: EntrepreneurPulse.com.au  Fact Sheet for Healthcare Providers: IncredibleEmployment.be  This test is not yet approved or cleared by the [place] FDA and has been authorized for detection and/or diagnosis of SARS-CoV-2 by FDA under an Emergency Use Authorization (EUA). This EUA will remain in effect (meaning this test can be used) for the duration of the COVID-19 declaration under Section 564(b)(1) of the Act, 21 U.S.C. section 360bbb-3(b)(1), unless the authorization is terminated or revoked.  Performed at [facility], [address], [place], [place] [address]      Radiological Exams on Admission: DG Chest 2 View  Result Date: 03/07/2021 CLINICAL DATA:  Cough and short of breath.  COPD EXAM: CHEST -  2 VIEW COMPARISON:  03/30/2019 FINDINGS: Interval development of left upper lobe infiltrate which may represent pneumonia. COPD with hyperinflation. Prominent lung markings diffusely due to chronic lung disease. Negative for heart failure. Small left pleural effusion or pleural scarring noted. IMPRESSION: COPD.  Left upper lobe infiltrate, probable pneumonia. Electronically Signed   By: Franchot Gallo M.D.   On: 03/07/2021 11:18     EKG: I have personally reviewed.  Sinus rhythm, QTC 412, LAD, poor R wave progression, first-degree AV block, right heart strain pattern  Assessment/Plan Principal Problem:   CAP (community acquired pneumonia) Active Problems:   Hypothyroidism   Paroxysmal atrial fibrillation (HCC)   BPH (benign prostatic hyperplasia)   Acute respiratory failure with hypoxia (HCC)   CKD (chronic kidney disease), stage IIIa   Sepsis (HCC)   COPD exacerbation (HCC)  Acute respiratory failure with hypoxia and sepsis due to CAP and COPD exacerbation: Patient meets criteria for sepsis with leukocytosis WBC 12.0, tachycardia with heart rate of 111, RR 20, fever of 100.7.  Pending lactic acid level.  Chest x-ray  showed left upper lobe infiltration.  - Will admit to med-surg bed as inpt - IV Rocephin and azithromycin  - Mucinex for cough  - Bronchodilators - soluMedrol 60 mg twice daily - Urine legionella and S. pneumococcal antigen - Follow up blood culture x2, sputum culture and respiratory virus panel - will get Procalcitonin and trend lactic acid level per sepsis protocol - IVF: 2L of LR bolus in ED, followed by 75 mL per hour of NS --> wil give more IVF bolus if lactic acid is elevated  Hypothyroidism -Synthroid  Paroxysmal atrial fibrillation (HCC) -Eliquis and diltiazem  BPH (benign prostatic hyperplasia) -Flomax  CKD (chronic kidney disease), stage IIIa: Stable.  Baseline creatinine 1.2-1.5.  His creatinine is 1.50, BUN is 19 today -Follow-up with BMP        DVT ppx:  SQ Lovenox Code Status: Full code Family Communication:   Yes, patient's wife at bed side Disposition Plan:  Anticipate discharge back to previous environment Consults called: None Admission status and Level of care: Med-Surg:    as inpt      Status is: Inpatient  Remains inpatient appropriate because: Patient has multiple comorbidities, now presents with acute respiratory failure due to community-acquired pneumonia and COPD exacerbation.  Patient has 2L of new oxygen requirement.  Patient also has sepsis.  His presentation is highly complicated.  Given his older age, patient is at high risk of deteriorating.  Will need to be treated in hospital for at least 2 days.          Date of Service 03/07/2021    Ivor Costa [facility]   If 7PM-7AM, please contact night-coverage [email or web address] 03/07/2021, 5:33 PM

## 2021-03-07 NOTE — ED Provider Notes (Signed)
[facility] Emergency Department Provider Note   ____________________________________________   Event Date/Time   First MD Initiated Contact with Patient 03/07/21 1510     (approximate)  I have reviewed the triage vital signs and the nursing notes.   HISTORY  Chief Complaint Shortness of Breath    HPI Levi [name] is a 80 y.o. male approximately 3 days of cough and congestion.  He is also been experiencing fevers now developing some shortness of breath for the last day  Does also have COPD and has been using his nebulizer at home which seems to loosen up his cough and help his breathing slightly yesterday  Wife concerned and thinks he might have pneumonia.  He is not been in the hospital or in a healthcare facility or had antibiotics they do not think for at least a couple of years.  He has a slightly distant history of lung cancer  No leg swelling.  No chest pain. 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[place])    Prostate enlargement    Shortness of breath    Squamous cell carcinoma of lung, left ([place])    Thyroid disease    Tobacco abuse     Patient Active Problem List   Diagnosis Date Noted   Anxiety disorder due to known physiological condition 08/28/2020   Asthma 08/28/2020   Hypothyroidism 08/28/2020   Paroxysmal atrial fibrillation ([place]) 08/28/2020   Depression 02/01/2019   Goals of care, counseling/discussion 11/25/2018   Squamous cell carcinoma lung, left ([place]) 10/27/2018   Nodule of upper lobe of left lung 10/20/2018   COPD (chronic obstructive pulmonary disease) with emphysema ([place]) 10/23/2015   Smoking addiction 10/31/2014    Past Surgical History:  Procedure Laterality Date   ELECTROMAGNETIC NAVIGATION BROCHOSCOPY Left 10/19/2018   Procedure: ELECTROMAGNETIC  NAVIGATION BRONCHOSCOPY LEFT;  Surgeon: Tyler Pita, MD;  Location: [facility] ORS;  Service: Cardiopulmonary;  Laterality: Left;   ENDOBRONCHIAL ULTRASOUND Left 10/19/2018   Procedure: ENDOBRONCHIAL ULTRASOUND LEFT;  Surgeon: Tyler Pita, MD;  Location: [facility] ORS;  Service: Cardiopulmonary;  Laterality: Left;   MELANOMA EXCISION Left    NO PAST SURGERIES      Prior to Admission medications   Medication Sig Start Date End Date Taking? Authorizing Provider  methylPREDNISolone sodium succinate (SOLU-MEDROL) 125 mg/2 mL injection Inject 2 mLs (125 mg total) into the vein once for 1 dose. 03/07/21 03/07/21 Yes Delman Kitten, MD  albuterol (PROVENTIL HFA;VENTOLIN HFA) 108 (90 Base) MCG/ACT inhaler Inhale 2 puffs into the lungs every 6 (six) hours as needed for wheezing or shortness of breath. 10/16/15   Shawnee Knapp, MD  apixaban (ELIQUIS) 5 MG TABS tablet Take 1 tablet (5 mg total) by mouth 2 (two) times daily. 04/02/19 01/17/20  Kate Sable, MD  budesonide-formoterol (SYMBICORT) 160-4.5 MCG/ACT inhaler Inhale 2 puffs into the lungs 2 (two) times daily.    [provider]  diltiazem (DILACOR XR) 120 MG 24 hr capsule Take 120 mg by mouth daily.    [provider]  levothyroxine (SYNTHROID) 137 MCG tablet Take 137 mcg by mouth daily before breakfast.    [provider]  tamsulosin (FLOMAX) 0.4 MG CAPS capsule Take 0.4 mg by mouth daily.  09/18/18   [provider]  vitamin B-12 (CYANOCOBALAMIN) 1000 MCG tablet Take 1,000 mcg by mouth daily.    [provider]  Vitamin D, Ergocalciferol, (DRISDOL) 1.25 MG (50000 UT) CAPS capsule Take 50,000 Units by mouth every 7 (seven) days.    [provider]    Allergies Patient has no known allergies.  Family History  Problem Relation Age of Onset   Aneurysm Mother    Cancer Father     Social History Social History   Tobacco Use   Smoking status: Former    Packs/day: 0.25    Years: 62.00     Pack years: 15.50    Types: Cigarettes    Quit date: 09/05/2018    Years since quitting: 2.5   Smokeless tobacco: Never  Vaping Use   Vaping Use: Never used  Substance Use Topics   Alcohol use: No    Alcohol/week: 0.0 standard drinks   Drug use: No    Review of Systems Constitutional: Fevers and fatigue Eyes: No visual changes. ENT: No sore throat. Cardiovascular: Denies chest pain. Respiratory: See HPI describes mild relieved with oxygen Gastrointestinal: No abdominal pain.  No diarrhea or vomiting Genitourinary: Negative for dysuria. Musculoskeletal: Negative for back pain. Skin: Negative for rash. Neurological: Negative for headaches, areas of focal weakness or numbness.    ____________________________________________   PHYSICAL EXAM:  VITAL SIGNS: ED Triage Vitals  Enc Vitals Group     BP 03/07/21 1009 (!) 145/91     Pulse Rate 03/07/21 1009 (!) 111     Resp 03/07/21 1009 18     Temp 03/07/21 1009 (!) 100.7 F (38.2 C)     Temp Source 03/07/21 1009 Oral     SpO2 03/07/21 1009 (!) 89 %     Weight 03/07/21 1019 270 lb (122.5 kg)     Height 03/07/21 1019 5\' 11"  (1.803 m)     Head Circumference --      Peak Flow --      Pain Score 03/07/21 1009 5     Pain Loc --      Pain Edu? --      Excl. in [place]? --     Constitutional: Alert and oriented. Well appearing and in no acute distress.  On 2 L oxygen saturation about 93%. Eyes: Conjunctivae are normal. Head: Atraumatic. Nose: No congestion/rhinnorhea. Mouth/Throat: Mucous membranes are moist. Neck: No stridor.  Cardiovascular: Normal rate, regular rhythm. Grossly normal heart sounds.  Good peripheral circulation. Respiratory: Normal respiratory effort.  No retractions.  He has a central rhonchorous cough.  No notable expiratory wheezing.  Crackles noted throughout the left upper and left lower lung fields.  Speaks in clear sentences.  Does have a 2 L nasal cannula oxygen requirement to keep O2 sats in the  90s Gastrointestinal: Soft and nontender. No distention. Musculoskeletal: No lower extremity tenderness nor edema. Neurologic:  Normal speech and language. No gross focal neurologic deficits are appreciated.  Skin:  Skin is warm, dry and intact. No rash noted. Psychiatric: Mood and affect are normal. Speech and behavior are normal.  ____________________________________________   LABS (all labs ordered are listed, but only abnormal results are displayed)  Labs Reviewed  BASIC METABOLIC PANEL - Abnormal; Notable for the following components:      Result Value   CO2 20 (*)    Glucose, Bld 209 (*)    Creatinine, Ser 1.50 (*)    GFR, Estimated 47 (*)    All other components within normal limits  CBC -  Abnormal; Notable for the following components:   WBC 12.0 (*)    All other components within normal limits  PROTIME-INR - Abnormal; Notable for the following components:   Prothrombin Time 15.5 (*)    All other components within normal limits  RESP PANEL BY RT-PCR (FLU A&B, COVID) ARPGX2  CULTURE, BLOOD (ROUTINE X 2)  CULTURE, BLOOD (ROUTINE X 2)  LACTIC ACID, PLASMA  LACTIC ACID, PLASMA  URINALYSIS, COMPLETE (UACMP) WITH MICROSCOPIC  POC SARS CORONAVIRUS 2 AG -  ED  TROPONIN I (HIGH SENSITIVITY)  TROPONIN I (HIGH SENSITIVITY)   ____________________________________________  EKG  Reviewed inter by me 1130 Heart rate 105 QRS 90 QTc 420 Sinus tachycardia, no evidence of acute ischemia denoted. ____________________________________________  RADIOLOGY  Reviewed, left upper lobe infiltrate ____________________________________________   PROCEDURES  Procedure(s) performed: None  Procedures  Critical Care performed: No  ____________________________________________   INITIAL IMPRESSION / ASSESSMENT AND PLAN / ED COURSE  Pertinent labs & imaging results that were available during my care of the patient were reviewed by me and considered in my medical decision making  (see chart for details).   No associated chest pain no findings suggestive pulmonary embolism.  Patient appears to have community-acquired pneumonia without obvious risk factor for healthcare associated pneumonia.  He does have oxygen requirement, evidence of sepsis without evidence of severe sepsis with lactic acid pending   Clinical Course as of 03/07/21 1528  Sun Mar 07, 2021  1512 DG Chest 2 View LUL infiltrate noted. Personally viewed [MQ]    Clinical Course User Index [MQ] Delman Kitten, MD   Clinical history examination most consistent with healthcare associated pneumonia.  I see no evidence of ACS, ischemia.  He does have some evidence that might support use of steroids and nebulizer treatments given his known history of COPD though he is not overtly wheezing.  We will cover broadly with antibiotics admit to hospitalist  Admitted to hospitalist Dr. Blaine Hamper  Patient and wife understand agreeable with plan for admission  ____________________________________________   FINAL CLINICAL IMPRESSION(S) / ED DIAGNOSES  Final diagnoses:  Community acquired pneumonia of left upper lobe of lung  Sepsis, due to unspecified organism, unspecified whether acute organ dysfunction present [facility])        Note:  This document was prepared using Dragon voice recognition software and may include unintentional dictation errors       Delman Kitten, MD 03/07/21 1557

## 2021-03-08 DIAGNOSIS — J441 Chronic obstructive pulmonary disease with (acute) exacerbation: Secondary | ICD-10-CM

## 2021-03-08 LAB — RESPIRATORY PANEL BY PCR

## 2021-03-08 NOTE — Progress Notes (Signed)
PROGRESS NOTE    Levi [name]  HDQ:[id number] DOB: 1940-10-15 DOA: 03/07/2021 PCP: [facility]   Chief complaint.  Shortness of breath. Brief Narrative:  Levi [name] is a 80 y.o. male with medical history significant of former smoker, COPD, asthma, hypothyroidism, anxiety, squamous cell lung cancer, BPH, PAF on Eliquis, CKD-3A, who presents with shortness of breath. He is COVID-negative, upon arriving the hospital, temperature 100.7, heart rate 111, respiratory 20.  WBC 12.0. Patient is diagnosed with COPD exacerbation and pneumonia.  Started on Rocephin and Zithromax, Solu-Medrol and bronchodilator.  Patient is also requiring 5 L oxygen for acute respiratory failure.   Assessment & Plan:   Principal Problem:   CAP (community acquired pneumonia) Active Problems:   Hypothyroidism   Paroxysmal atrial fibrillation (HCC)   BPH (benign prostatic hyperplasia)   Acute respiratory failure with hypoxia (HCC)   CKD (chronic kidney disease), stage IIIa   Sepsis ([place])   COPD exacerbation (H[facility])  Acute respiratory failure with hypoxemia secondary to pneumonia and COPD exacerbation. Sepsis is secondary to pneumonia. COPD exacerbation. Left upper lobe pneumonia. Patient still requiring 5 L oxygen.  No evidence of volume overload. Will continue Rocephin and Zithromax while pending culture results. Discontinue IV fluids as patient is hemodynamically stable.  Paroxysmal atrial fibrillation. Continue Eliquis and diltiazem.  Chronic kidney disease stage IIIa. Condition stable.      DVT prophylaxis: Lovenox Code Status: full Family Communication:  Disposition Plan:    Status is: Inpatient  Remains inpatient appropriate because: Due to severity of disease and IV antibiotics.        I/O last 3 completed shifts: In: 2000 [IV Piggyback:2000] Out: 1194 [Urine:1375] No intake/output data recorded.     Consultants:  None  Procedures: None  Antimicrobials:  Rocephin and Zithromax.  Subjective: Patient still complaining significant short of breath with minimal exertion, on 5 L oxygen. Cough, with a white mucus. No fever or chills. No abdominal pain or nausea vomiting. No dysuria hematuria.  Objective: Vitals:   03/07/21 2350 03/08/21 0616 03/08/21 0747 03/08/21 0809  BP: (!) 166/81 131/84  132/71  Pulse: 96 75  72  Resp: 18 16  16   Temp: 97.6 F (36.4 C) 97.8 F (36.6 C)  97.6 F (36.4 C)  TempSrc: Oral Oral  Oral  SpO2: 96% 93% 91% 93%  Weight: 122.2 kg     Height: 5\' 11"  (1.803 m)       Intake/Output Summary (Last 24 hours) at 03/08/2021 1024 Last data filed at 03/08/2021 1740 Gross per 24 hour  Intake 2000 ml  Output 1375 ml  Net 625 ml   Filed Weights   03/07/21 1019 03/07/21 2350  Weight: 122.5 kg 122.2 kg    Examination:  General exam: Appears calm and comfortable  Respiratory system: Clear to auscultation. Respiratory effort normal. Cardiovascular system: S1 & S2 heard, RRR. No JVD, murmurs, rubs, gallops or clicks. No pedal edema. Gastrointestinal system: Abdomen is nondistended, soft and nontender. No organomegaly or masses felt. Normal bowel sounds heard. Central nervous system: Alert and oriented x3. No focal neurological deficits. Extremities: Symmetric 5 x 5 power. Skin: No rashes, lesions or ulcers Psychiatry:  Mood & affect appropriate.     Data Reviewed: I have personally reviewed following labs and imaging studies  CBC: Recent Labs  Lab 03/07/21 1034  WBC 12.0*  HGB 16.4  HCT 49.4  MCV 94.3  PLT 814   Basic Metabolic Panel: Recent Labs  Lab 03/07/21 1034  NA  135  K 4.7  CL 107  CO2 20*  GLUCOSE 209*  BUN 19  CREATININE 1.50*  CALCIUM 8.9   GFR: Estimated Creatinine Clearance: 54 mL/min (A) (by C-G formula based on SCr of 1.5 mg/dL (H)). Liver Function Tests: No results for input(s): AST, ALT, ALKPHOS, BILITOT, PROT, ALBUMIN in the last 168 hours. No results for input(s):  LIPASE, AMYLASE in the last 168 hours. No results for input(s): AMMONIA in the last 168 hours. Coagulation Profile: Recent Labs  Lab 03/07/21 1034  INR 1.2   Cardiac Enzymes: No results for input(s): CKTOTAL, CKMB, CKMBINDEX, TROPONINI in the last 168 hours. BNP (last 3 results) No results for input(s): PROBNP in the last 8760 hours. HbA1C: No results for input(s): HGBA1C in the last 72 hours. CBG: No results for input(s): GLUCAP in the last 168 hours. Lipid Profile: No results for input(s): CHOL, HDL, LDLCALC, TRIG, CHOLHDL, LDLDIRECT in the last 72 hours. Thyroid Function Tests: No results for input(s): TSH, T4TOTAL, FREET4, T3FREE, THYROIDAB in the last 72 hours. Anemia Panel: No results for input(s): VITAMINB12, FOLATE, FERRITIN, TIBC, IRON, RETICCTPCT in the last 72 hours. Sepsis Labs: Recent Labs  Lab 03/07/21 1533  PROCALCITON 0.12  LATICACIDVEN 1.8    Recent Results (from the past 240 hour(s))  Resp Panel by RT-PCR (Flu A&B, Covid) Nasopharyngeal Swab     Status: None   Collection Time: 03/07/21  3:33 PM   Specimen: Nasopharyngeal Swab; Nasopharyngeal(NP) swabs in vial transport medium  Result Value Ref Range Status   SARS Coronavirus 2 by RT PCR NEGATIVE NEGATIVE Final    Comment: (NOTE) SARS-CoV-2 target nucleic acids are NOT DETECTED.  The SARS-CoV-2 RNA is generally detectable in upper respiratory specimens during the acute phase of infection. The lowest concentration of SARS-CoV-2 viral copies this assay can detect is 138 copies/mL. A negative result does not preclude SARS-Cov-2 infection and should not be used as the sole basis for treatment or other patient management decisions. A negative result may occur with  improper specimen collection/handling, submission of specimen other than nasopharyngeal swab, presence of viral mutation(s) within the areas targeted by this assay, and inadequate number of viral copies(<138 copies/mL). A negative result must be  combined with clinical observations, patient history, and epidemiological information. The expected result is Negative.  Fact Sheet for Patients:  EntrepreneurPulse.com.au  Fact Sheet for Healthcare Providers:  IncredibleEmployment.be  This test is no t yet approved or cleared by the [place] FDA and  has been authorized for detection and/or diagnosis of SARS-CoV-2 by FDA under an Emergency Use Authorization (EUA). This EUA will remain  in effect (meaning this test can be used) for the duration of the COVID-19 declaration under Section 564(b)(1) of the Act, 21 U.S.C.section 360bbb-3(b)(1), unless the authorization is terminated  or revoked sooner.       Influenza A by PCR NEGATIVE NEGATIVE Final   Influenza B by PCR NEGATIVE NEGATIVE Final    Comment: (NOTE) The Xpert Xpress SARS-CoV-2/FLU/RSV plus assay is intended as an aid in the diagnosis of influenza from Nasopharyngeal swab specimens and should not be used as a sole basis for treatment. Nasal washings and aspirates are unacceptable for Xpert Xpress SARS-CoV-2/FLU/RSV testing.  Fact Sheet for Patients: EntrepreneurPulse.com.au  Fact Sheet for Healthcare Providers: IncredibleEmployment.be  This test is not yet approved or cleared by the [place] FDA and has been authorized for detection and/or diagnosis of SARS-CoV-2 by FDA under an Emergency Use Authorization (EUA). This EUA will remain in  effect (meaning this test can be used) for the duration of the COVID-19 declaration under Section 564(b)(1) of the Act, 21 U.S.C. section 360bbb-3(b)(1), unless the authorization is terminated or revoked.  Performed at [facility], [place], [place] [address]   Respiratory (~20 pathogens) panel by PCR     Status: None   Collection Time: 03/07/21  3:33 PM   Specimen: Nasopharyngeal Swab; Respiratory  Result Value Ref Range  Status   Adenovirus NOT DETECTED NOT DETECTED Final   Coronavirus 229E NOT DETECTED NOT DETECTED Final    Comment: (NOTE) The Coronavirus on the Respiratory Panel, DOES NOT test for the novel  Coronavirus (2019 nCoV)    Coronavirus HKU1 NOT DETECTED NOT DETECTED Final   Coronavirus NL63 NOT DETECTED NOT DETECTED Final   Coronavirus OC43 NOT DETECTED NOT DETECTED Final   Metapneumovirus NOT DETECTED NOT DETECTED Final   Rhinovirus / Enterovirus NOT DETECTED NOT DETECTED Final   Influenza A NOT DETECTED NOT DETECTED Final   Influenza B NOT DETECTED NOT DETECTED Final   Parainfluenza Virus 1 NOT DETECTED NOT DETECTED Final   Parainfluenza Virus 2 NOT DETECTED NOT DETECTED Final   Parainfluenza Virus 3 NOT DETECTED NOT DETECTED Final   Parainfluenza Virus 4 NOT DETECTED NOT DETECTED Final   Respiratory Syncytial Virus NOT DETECTED NOT DETECTED Final   Bordetella pertussis NOT DETECTED NOT DETECTED Final   Bordetella Parapertussis NOT DETECTED NOT DETECTED Final   Chlamydophila pneumoniae NOT DETECTED NOT DETECTED Final   Mycoplasma pneumoniae NOT DETECTED NOT DETECTED Final    Comment: Performed at [facility], [place]. [address] [place], [place] [address]         Radiology Studies: DG Chest 2 View  Result Date: 03/07/2021 CLINICAL DATA:  Cough and short of breath.  COPD EXAM: CHEST - 2 VIEW COMPARISON:  03/30/2019 FINDINGS: Interval development of left upper lobe infiltrate which may represent pneumonia. COPD with hyperinflation. Prominent lung markings diffusely due to chronic lung disease. Negative for heart failure. Small left pleural effusion or pleural scarring noted. IMPRESSION: COPD.  Left upper lobe infiltrate, probable pneumonia. Electronically Signed   By: Franchot Gallo M.D.   On: 03/07/2021 11:18        Scheduled Meds:  apixaban  5 mg Oral BID   diltiazem  120 mg Oral Daily   ipratropium-albuterol  3 mL Nebulization Q4H   levothyroxine  137 mcg Oral Q0600    methylPREDNISolone (SOLU-MEDROL) injection  60 mg Intravenous Q12H   mometasone-formoterol  2 puff Inhalation BID   tamsulosin  0.4 mg Oral Daily   vitamin B-12  1,000 mcg Oral Daily   Continuous Infusions:  azithromycin Stopped (03/07/21 1731)   cefTRIAXone (ROCEPHIN)  IV Stopped (03/07/21 1654)   lactated ringers 75 mL/hr at 03/08/21 0016     LOS: 1 day    Time spent: 32 minutes    Sharen Hones, MD [facility]   To contact the attending provider between 7A-7P or the covering provider during after hours 7P-7A, please log into the web site [email or web address] and access using universal [place] password for that web site. If you do not have the password, please call the hospital operator.  03/08/2021, 10:24 AM

## 2021-03-09 ENCOUNTER — Encounter: Payer: Self-pay | Admitting: Internal Medicine

## 2021-03-09 ENCOUNTER — Inpatient Hospital Stay: Payer: No Typology Code available for payment source

## 2021-03-09 LAB — CBC WITH DIFFERENTIAL/PLATELET
Abs Immature Granulocytes: 0.12 10*3/uL — ABNORMAL HIGH (ref 0.00–0.07)
Basophils Absolute: 0 10*3/uL (ref 0.0–0.1)
Basophils Relative: 0 %
Eosinophils Absolute: 0 10*3/uL (ref 0.0–0.5)
Eosinophils Relative: 0 %
HCT: 41.9 % (ref 39.0–52.0)
Hemoglobin: 13.6 g/dL (ref 13.0–17.0)
Immature Granulocytes: 1 %
Lymphocytes Relative: 5 %
Lymphs Abs: 0.7 10*3/uL (ref 0.7–4.0)
MCH: 30.4 pg (ref 26.0–34.0)
MCHC: 32.5 g/dL (ref 30.0–36.0)
MCV: 93.5 fL (ref 80.0–100.0)
Monocytes Absolute: 0.7 10*3/uL (ref 0.1–1.0)
Monocytes Relative: 5 %
Neutro Abs: 13 10*3/uL — ABNORMAL HIGH (ref 1.7–7.7)
Neutrophils Relative %: 89 %
Platelets: 186 10*3/uL (ref 150–400)
RBC: 4.48 MIL/uL (ref 4.22–5.81)
RDW: 13.6 % (ref 11.5–15.5)
WBC: 14.5 10*3/uL — ABNORMAL HIGH (ref 4.0–10.5)
nRBC: 0 % (ref 0.0–0.2)

## 2021-03-09 LAB — BASIC METABOLIC PANEL
Anion gap: 9 (ref 5–15)
BUN: 32 mg/dL — ABNORMAL HIGH (ref 8–23)
CO2: 20 mmol/L — ABNORMAL LOW (ref 22–32)
Calcium: 8.4 mg/dL — ABNORMAL LOW (ref 8.9–10.3)
Chloride: 103 mmol/L (ref 98–111)
Creatinine, Ser: 1.5 mg/dL — ABNORMAL HIGH (ref 0.61–1.24)
GFR, Estimated: 47 mL/min — ABNORMAL LOW (ref >60)
Glucose, Bld: 351 mg/dL — ABNORMAL HIGH (ref 70–99)
Potassium: 4.6 mmol/L (ref 3.5–5.1)
Sodium: 132 mmol/L — ABNORMAL LOW (ref 135–145)

## 2021-03-09 LAB — GLUCOSE, CAPILLARY
Glucose-Capillary: 288 mg/dL — ABNORMAL HIGH (ref 70–99)
Glucose-Capillary: 288 mg/dL — ABNORMAL HIGH (ref 70–99)
Glucose-Capillary: 317 mg/dL — ABNORMAL HIGH (ref 70–99)

## 2021-03-09 LAB — HEMOGLOBIN A1C
Hgb A1c MFr Bld: 7.8 % — ABNORMAL HIGH (ref 4.8–5.6)
Mean Plasma Glucose: 177.16 mg/dL

## 2021-03-09 MED ORDER — INSULIN ASPART 100 UNIT/ML IJ SOLN
0.0000 [IU] | Freq: Three times a day (TID) | INTRAMUSCULAR | Status: DC
  Administered 2021-03-09: 8 [IU] via SUBCUTANEOUS
  Administered 2021-03-09: 17:00:00 11 [IU] via SUBCUTANEOUS
  Administered 2021-03-09: 8 [IU] via SUBCUTANEOUS
  Administered 2021-03-10 – 2021-03-11 (×4): 5 [IU] via SUBCUTANEOUS
  Administered 2021-03-11: 2 [IU] via SUBCUTANEOUS
  Administered 2021-03-11: 12:00:00 3 [IU] via SUBCUTANEOUS
  Administered 2021-03-12: 10:00:00 5 [IU] via SUBCUTANEOUS
  Administered 2021-03-12: 17:00:00 8 [IU] via SUBCUTANEOUS
  Administered 2021-03-12: 2 [IU] via SUBCUTANEOUS
  Administered 2021-03-13 (×2): 3 [IU] via SUBCUTANEOUS
  Filled 2021-03-09 (×14): qty 1

## 2021-03-09 MED ORDER — PREDNISONE 20 MG PO TABS
40.0000 mg | ORAL_TABLET | Freq: Every day | ORAL | Status: DC
Start: 2021-03-09 — End: 2021-03-12
  Administered 2021-03-09 – 2021-03-12 (×4): 40 mg via ORAL
  Filled 2021-03-09 (×4): qty 2

## 2021-03-09 NOTE — Progress Notes (Signed)
Inpatient Diabetes Program Recommendations  AACE/ADA: New Consensus Statement on Inpatient Glycemic Control (2015)  Target Ranges:  Prepandial:   less than 140 mg/dL      Peak postprandial:   less than 180 mg/dL (1-2 hours)      Critically ill patients:  140 - 180 mg/dL  Results for Levi [name], Levi [name] (MRN [id number]) as of 03/09/2021 07:33  Ref. Range 03/07/2021 10:34 03/09/2021 05:53  Glucose Latest Ref Range: 70 - 99 mg/dL 209 (H) 351 (H)    Admit with:  Acute respiratory failure with hypoxemia secondary to pneumonia and COPD exacerbation. Sepsis  No History of Diabetes    MD- Note patient getting Solumedrol 60 mg BID  Lab glucose levels elevated >200 on 11/06 and 11/08  Please consider placing orders for CBG checks and Novolog 0-15 (Moderate) scale TID AC     --Will follow patient during hospitalization--  Wyn Quaker RN, MSN, CDE Diabetes Coordinator Inpatient Glycemic Control Team Team Pager: [phone] (8a-5p)

## 2021-03-09 NOTE — Progress Notes (Signed)
PROGRESS NOTE    Levi [name]  VWU:[id number] DOB: 06-30-1940 DOA: 03/07/2021 PCP: [facility]   Chief complaint shortness of breath. Brief Narrative:  Levi [name] is a 80 y.o. male with medical history significant of former smoker, COPD, asthma, hypothyroidism, anxiety, squamous cell lung cancer, BPH, PAF on Eliquis, CKD-3A, who presents with shortness of breath. He is COVID-negative, upon arriving the hospital, temperature 100.7, heart rate 111, respiratory 20.  WBC 12.0. Patient is diagnosed with COPD exacerbation and pneumonia.  Started on Rocephin and Zithromax, Solu-Medrol and bronchodilator.  Patient is also requiring 5 L oxygen for acute respiratory failure.   Assessment & Plan:   Principal Problem:   CAP (community acquired pneumonia) Active Problems:   Hypothyroidism   Paroxysmal atrial fibrillation (HCC)   BPH (benign prostatic hyperplasia)   Acute respiratory failure with hypoxia (HCC)   CKD (chronic kidney disease), stage IIIa   Sepsis ([facility])   COPD exacerbation (HCC)  Acute respiratory failure with hypoxemia secondary to pneumonia and COPD exacerbation. Sepsis secondary to pneumonia. COPD exacerbation. Left upper lobe pneumonia. Still requiring 5 L oxygen.  I reviewed patient chest x-ray image, previous CT scan of the chest in September.  Patient had a squamous cell carcinoma, CT scan did not show additional cancer.  His currently chest x-ray does not looks like a typical pneumonia. Procalcitonin level is not significantly elevated.  He does not have volume overload. As a result, I will obtain another CT scan without contrast to evaluate. Patient is also treated with steroids for COPD exacerbation, due to worsening glucose, no significant bronchospasm, I will change steroids to oral.  Hyperglycemia.  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 Patient never been diagnosed with type 2 diabetes, blood glucose running high.  Discontinue IV steroids.  Add a sliding  scale insulin. Also check hemoglobin A1c.  Paroxysm atrial fibrillation. Continue Eliquis and diltiazem.  Chronic kidney disease stage IIIa.   Hyponatremia. Stable.  Discussed with the patient and wife, patient is still full code.  DVT prophylaxis: Lovenox Code Status: full Family Communication:  Disposition Plan:      Status is: Inpatient   Remains inpatient appropriate because: Due to severity of disease and IV antibiotics.       I/O last 3 completed shifts: In: 1828.1 [P.O.:480; IV Piggyback:1348.1] Out: 1050 [Urine:1050] Total I/O In: 480 [P.O.:480] Out: -     Consultants:  None   Procedures: None   Antimicrobials: Rocephin and Zithromax.   Subjective: Patient still has significant short of breath with minimal exertion.  He has a cough, with small amount of mucus.  No hemoptysis. Still requiring 5 L oxygen. No abdominal pain or nausea vomiting. No dysuria hematuria No fever or chills.  Objective: Vitals:   03/08/21 2053 03/09/21 0453 03/09/21 0721 03/09/21 0829  BP: 131/65 126/72  131/77  Pulse: 90 78  74  Resp: 16 16  20   Temp: 98.8 F (37.1 C) 98.1 F (36.7 C)  98.4 F (36.9 C)  TempSrc:  Oral    SpO2: 93% 93% 91% 93%  Weight:      Height:        Intake/Output Summary (Last 24 hours) at 03/09/2021 1107 Last data filed at 03/09/2021 1008 Gross per 24 hour  Intake 1308.13 ml  Output --  Net 1308.13 ml   Filed Weights   03/07/21 1019 03/07/21 2350  Weight: 122.5 kg 122.2 kg    Examination:  General exam: Appears calm and comfortable  Respiratory system: Decreased  breathing sounds. Respiratory effort normal. Cardiovascular system: S1 & S2 heard, RRR. No JVD, murmurs, rubs, gallops or clicks. No pedal edema. Gastrointestinal system: Abdomen is nondistended, soft and nontender. No organomegaly or masses felt. Normal bowel sounds heard. Central nervous system: Alert and oriented x3. No focal neurological deficits. Extremities: Symmetric  5 x 5 power. Skin: No rashes, lesions or ulcers Psychiatry: Mood & affect appropriate.     Data Reviewed: I have personally reviewed following labs and imaging studies  CBC: Recent Labs  Lab 03/07/21 1034 03/09/21 0553  WBC 12.0* 14.5*  NEUTROABS  --  13.0*  HGB 16.4 13.6  HCT 49.4 41.9  MCV 94.3 93.5  PLT 168 361   Basic Metabolic Panel: Recent Labs  Lab 03/07/21 1034 03/09/21 0553  NA 135 132*  K 4.7 4.6  CL 107 103  CO2 20* 20*  GLUCOSE 209* 351*  BUN 19 32*  CREATININE 1.50* 1.50*  CALCIUM 8.9 8.4*   GFR: Estimated Creatinine Clearance: 54 mL/min (A) (by C-G formula based on SCr of 1.5 mg/dL (H)). Liver Function Tests: No results for input(s): AST, ALT, ALKPHOS, BILITOT, PROT, ALBUMIN in the last 168 hours. No results for input(s): LIPASE, AMYLASE in the last 168 hours. No results for input(s): AMMONIA in the last 168 hours. Coagulation Profile: Recent Labs  Lab 03/07/21 1034  INR 1.2   Cardiac Enzymes: No results for input(s): CKTOTAL, CKMB, CKMBINDEX, TROPONINI in the last 168 hours. BNP (last 3 results) No results for input(s): PROBNP in the last 8760 hours. HbA1C: No results for input(s): HGBA1C in the last 72 hours. CBG: Recent Labs  Lab 03/09/21 0826  GLUCAP 288*   Lipid Profile: No results for input(s): CHOL, HDL, LDLCALC, TRIG, CHOLHDL, LDLDIRECT in the last 72 hours. Thyroid Function Tests: No results for input(s): TSH, T4TOTAL, FREET4, T3FREE, THYROIDAB in the last 72 hours. Anemia Panel: No results for input(s): VITAMINB12, FOLATE, FERRITIN, TIBC, IRON, RETICCTPCT in the last 72 hours. Sepsis Labs: Recent Labs  Lab 03/07/21 1533  PROCALCITON 0.12  LATICACIDVEN 1.8    Recent Results (from the past 240 hour(s))  Resp Panel by RT-PCR (Flu A&B, Covid) Nasopharyngeal Swab     Status: None   Collection Time: 03/07/21  3:33 PM   Specimen: Nasopharyngeal Swab; Nasopharyngeal(NP) swabs in vial transport medium  Result Value Ref Range  Status   SARS Coronavirus 2 by RT PCR NEGATIVE NEGATIVE Final    Comment: (NOTE) SARS-CoV-2 target nucleic acids are NOT DETECTED.  The SARS-CoV-2 RNA is generally detectable in upper respiratory specimens during the acute phase of infection. The lowest concentration of SARS-CoV-2 viral copies this assay can detect is 138 copies/mL. A negative result does not preclude SARS-Cov-2 infection and should not be used as the sole basis for treatment or other patient management decisions. A negative result may occur with  improper specimen collection/handling, submission of specimen other than nasopharyngeal swab, presence of viral mutation(s) within the areas targeted by this assay, and inadequate number of viral copies(<138 copies/mL). A negative result must be combined with clinical observations, patient history, and epidemiological information. The expected result is Negative.  Fact Sheet for Patients:  EntrepreneurPulse.com.au  Fact Sheet for Healthcare Providers:  IncredibleEmployment.be  This test is no t yet approved or cleared by the [place] FDA and  has been authorized for detection and/or diagnosis of SARS-CoV-2 by FDA under an Emergency Use Authorization (EUA). This EUA will remain  in effect (meaning this test can be used) for the  duration of the COVID-19 declaration under Section 564(b)(1) of the Act, 21 U.S.C.section 360bbb-3(b)(1), unless the authorization is terminated  or revoked sooner.       Influenza A by PCR NEGATIVE NEGATIVE Final   Influenza B by PCR NEGATIVE NEGATIVE Final    Comment: (NOTE) The Xpert Xpress SARS-CoV-2/FLU/RSV plus assay is intended as an aid in the diagnosis of influenza from Nasopharyngeal swab specimens and should not be used as a sole basis for treatment. Nasal washings and aspirates are unacceptable for Xpert Xpress SARS-CoV-2/FLU/RSV testing.  Fact Sheet for  Patients: EntrepreneurPulse.com.au  Fact Sheet for Healthcare Providers: IncredibleEmployment.be  This test is not yet approved or cleared by the [place] FDA and has been authorized for detection and/or diagnosis of SARS-CoV-2 by FDA under an Emergency Use Authorization (EUA). This EUA will remain in effect (meaning this test can be used) for the duration of the COVID-19 declaration under Section 564(b)(1) of the Act, 21 U.S.C. section 360bbb-3(b)(1), unless the authorization is terminated or revoked.  Performed at [facility], [address] [place], [place] [address]   Blood Culture (routine x 2)     Status: None (Preliminary result)   Collection Time: 03/07/21  3:33 PM   Specimen: BLOOD  Result Value Ref Range Status   Specimen Description BLOOD LEFT HAND  Final   Special Requests   Final    BOTTLES DRAWN AEROBIC AND ANAEROBIC Blood Culture adequate volume   Culture   Final    NO GROWTH 2 DAYS Performed at [facility], [address], [place], [place] [address]    Report Status PENDING  Incomplete  Blood Culture (routine x 2)     Status: None (Preliminary result)   Collection Time: 03/07/21  3:33 PM   Specimen: BLOOD  Result Value Ref Range Status   Specimen Description BLOOD RIGHT HAND  Final   Special Requests   Final    BOTTLES DRAWN AEROBIC AND ANAEROBIC Blood Culture results may not be optimal due to an inadequate volume of blood received in culture bottles   Culture   Final    NO GROWTH 2 DAYS Performed at [facility], [address] [place], [place] [address]    Report Status PENDING  Incomplete  Respiratory (~20 pathogens) panel by PCR     Status: None   Collection Time: 03/07/21  3:33 PM   Specimen: Nasopharyngeal Swab; Respiratory  Result Value Ref Range Status   Adenovirus NOT DETECTED NOT DETECTED Final   Coronavirus 229E NOT DETECTED NOT DETECTED Final    Comment: (NOTE) The  Coronavirus on the Respiratory Panel, DOES NOT test for the novel  Coronavirus (2019 nCoV)    Coronavirus HKU1 NOT DETECTED NOT DETECTED Final   Coronavirus NL63 NOT DETECTED NOT DETECTED Final   Coronavirus OC43 NOT DETECTED NOT DETECTED Final   Metapneumovirus NOT DETECTED NOT DETECTED Final   Rhinovirus / Enterovirus NOT DETECTED NOT DETECTED Final   Influenza A NOT DETECTED NOT DETECTED Final   Influenza B NOT DETECTED NOT DETECTED Final   Parainfluenza Virus 1 NOT DETECTED NOT DETECTED Final   Parainfluenza Virus 2 NOT DETECTED NOT DETECTED Final   Parainfluenza Virus 3 NOT DETECTED NOT DETECTED Final   Parainfluenza Virus 4 NOT DETECTED NOT DETECTED Final   Respiratory Syncytial Virus NOT DETECTED NOT DETECTED Final   Bordetella pertussis NOT DETECTED NOT DETECTED Final   Bordetella Parapertussis NOT DETECTED NOT DETECTED Final   Chlamydophila pneumoniae NOT DETECTED NOT DETECTED  Final   Mycoplasma pneumoniae NOT DETECTED NOT DETECTED Final    Comment: Performed at [facility], [place] [address], [place], [place] [address]         Radiology Studies: No results found.      Scheduled Meds:  apixaban  5 mg Oral BID   diltiazem  120 mg Oral Daily   insulin aspart  0-15 Units Subcutaneous TID WC   ipratropium-albuterol  3 mL Nebulization Q4H   levothyroxine  137 mcg Oral Q0600   mometasone-formoterol  2 puff Inhalation BID   predniSONE  40 mg Oral Q breakfast   tamsulosin  0.4 mg Oral Daily   vitamin B-12  1,000 mcg Oral Daily   Continuous Infusions:  azithromycin 500 mg (03/08/21 1700)   cefTRIAXone (ROCEPHIN)  IV 2 g (03/08/21 1625)     LOS: 2 days    Time spent: 32 minutes, more than 50% time involved in direct patient care.    Sharen Hones, MD [facility]   To contact the attending provider between 7A-7P or the covering provider during after hours 7P-7A, please log into the web site [email or web address] and access using universal [place]  password for that web site. If you do not have the password, please call the hospital operator.  03/09/2021, 11:07 AM

## 2021-03-10 DIAGNOSIS — J9601 Acute respiratory failure with hypoxia: Secondary | ICD-10-CM

## 2021-03-10 DIAGNOSIS — J441 Chronic obstructive pulmonary disease with (acute) exacerbation: Secondary | ICD-10-CM

## 2021-03-10 DIAGNOSIS — I480 Paroxysmal atrial fibrillation: Secondary | ICD-10-CM

## 2021-03-10 DIAGNOSIS — J189 Pneumonia, unspecified organism: Secondary | ICD-10-CM

## 2021-03-10 LAB — GLUCOSE, CAPILLARY
Glucose-Capillary: 240 mg/dL — ABNORMAL HIGH (ref 70–99)
Glucose-Capillary: 249 mg/dL — ABNORMAL HIGH (ref 70–99)
Glucose-Capillary: 243 mg/dL — ABNORMAL HIGH (ref 70–99)
Glucose-Capillary: 164 mg/dL — ABNORMAL HIGH (ref 70–99)

## 2021-03-10 LAB — EXPECTORATED SPUTUM ASSESSMENT W GRAM STAIN, RFLX TO RESP C

## 2021-03-10 MED ORDER — TAMSULOSIN HCL 0.4 MG PO CAPS
0.8000 mg | ORAL_CAPSULE | Freq: Every day | ORAL | Status: DC
  Administered 2021-03-11 – 2021-03-13 (×3): 0.8 mg via ORAL
  Filled 2021-03-10 (×3): qty 2

## 2021-03-10 MED ORDER — LIVING WELL WITH DIABETES BOOK
Freq: Once | Status: AC
  Filled 2021-03-10: qty 1

## 2021-03-10 MED ORDER — IPRATROPIUM-ALBUTEROL 0.5-2.5 (3) MG/3ML IN SOLN
3.0000 mL | Freq: Four times a day (QID) | RESPIRATORY_TRACT | Status: DC
  Administered 2021-03-10 – 2021-03-13 (×13): 3 mL via RESPIRATORY_TRACT
  Filled 2021-03-10 (×13): qty 3

## 2021-03-10 MED ORDER — AZITHROMYCIN 500 MG PO TABS
500.0000 mg | ORAL_TABLET | Freq: Every day | ORAL | Status: AC
  Administered 2021-03-10 – 2021-03-11 (×2): 500 mg via ORAL
  Filled 2021-03-10 (×2): qty 1

## 2021-03-10 MED ORDER — INSULIN GLARGINE-YFGN 100 UNIT/ML ~~LOC~~ SOLN
5.0000 [IU] | Freq: Every day | SUBCUTANEOUS | Status: DC
  Administered 2021-03-10 – 2021-03-13 (×4): 5 [IU] via SUBCUTANEOUS
  Filled 2021-03-10 (×4): qty 0.05

## 2021-03-10 MED ORDER — PANTOPRAZOLE SODIUM 20 MG PO TBEC
20.0000 mg | DELAYED_RELEASE_TABLET | Freq: Every day | ORAL | Status: DC
  Administered 2021-03-10 – 2021-03-13 (×4): 20 mg via ORAL
  Filled 2021-03-10 (×4): qty 1

## 2021-03-10 NOTE — Progress Notes (Signed)
PHARMACIST - PHYSICIAN COMMUNICATION  CONCERNING: Antibiotic IV to Oral Route Change Policy  RECOMMENDATION: This patient is receiving azithromycin by the intravenous route.  Based on criteria approved by the Pharmacy and Therapeutics Committee, the antibiotic(s) is/are being converted to the equivalent oral dose form(s).   DESCRIPTION: These criteria include: Patient being treated for a respiratory tract infection, urinary tract infection, cellulitis or clostridium difficile associated diarrhea if on metronidazole The patient is not neutropenic and does not exhibit a GI malabsorption state The patient is eating (either orally or via tube) and/or has been taking other orally administered medications for a least 24 hours The patient is improving clinically and has a Tmax < 100.5  If you have questions about this conversion, please contact the [place]  03/10/21

## 2021-03-10 NOTE — Progress Notes (Addendum)
Inpatient Diabetes Program Recommendations  AACE/ADA: New Consensus Statement on Inpatient Glycemic Control (2015)  Target Ranges:  Prepandial:   less than 140 mg/dL      Peak postprandial:   less than 180 mg/dL (1-2 hours)      Critically ill patients:  140 - 180 mg/dL  Results for HOLLISTER, WESSLER (MRN [id number]) as of 03/10/2021 07:24  Ref. Range 03/09/2021 08:26 03/09/2021 12:17 03/09/2021 16:19  Glucose-Capillary Latest Ref Range: 70 - 99 mg/dL 288 (H)  8 units Novolog  288 (H)  8 units Novolog  317 (H)  11 units Novolog   Results for QUANTEL, MCINTURFF (MRN [id number]) as of 03/10/2021 07:24  Ref. Range 03/09/2021 05:53  Hemoglobin A1C Latest Ref Range: 4.8 - 5.6 % 7.8 (H)  (177 mg/dl)    Admit with:  Acute respiratory failure with hypoxemia secondary to pneumonia and COPD exacerbation. Sepsis   No History of Diabetes  Started Novolog 0-15 units TID yesterday AM  Solumedrol stopped--last dose given yesterday 11/08 at 6am  Started Prednisone 40 mg daily yesterday 11/08 at 4:30pm    MD- Note A1c is 7.8%.  Is this a new diagnosis of diabetes for this patient?  If so, please address with pt and the Diabetes RN will plan to speak w/ pt and order educational materials  May benefit from low dose Sulfonylurea at discharge like Amaryl 2 mg daily   Addendum 11:30am--Met w/ pt at bedside to discuss glucose elevations, steroids, elevated A1c, possible new diagnosis if diabetes.  Pt told me he had lung cancer and got lots of steroids while he was getting chemo and radiation.  Finished cancer treatments about 2 years ago.  Gained 50 pounds after he was diagnosed with cancer--mosly sedentary at home.  Sees PCP at the [facility] and has next appt in December.  Wife has diabetes but stopped meds due to improved CBGs--Wife still checks her CBGs at home and pt states he watches her check her CBGs and will likely have her help him check his CBGs if needs to check them at home after  d/c.  We discussed the following: What diabetes is/ pathophysiology/ complications Normal CBG ranges Ways to control CBGs How to check CBGs How steroids affect CBGs Ways to modify nutrition at home to improve CBGs Patient told me he mostly drinks water and sugar free kool aid.  Encouraged pt to let his PCP at the [facility] know about his sugar levels and to let them know of the Hospital MD discharges pt home on any meds for his sugar levels.  Pt agreeable.  I asked pt if I could call his wife to give her an update on his sugar levels, etc, however, pt told me his wife is sick at home and asked me not to disturb her.   --Will follow patient during hospitalization--  Wyn Quaker RN, MSN, CDE Diabetes Coordinator Inpatient Glycemic Control Team Team Pager: [phone] (8a-5p)

## 2021-03-10 NOTE — Progress Notes (Signed)
PROGRESS NOTE    Levi [name]  GGY:[id number] DOB: 1940/08/02 DOA: 03/07/2021 PCP: [place]   Chief complaint shortness of breath. Brief Narrative:  Levi [name] is a 80 y.o. male with medical history significant of former smoker, COPD, asthma, hypothyroidism, anxiety, squamous cell lung cancer, BPH, PAF on Eliquis, CKD-3A, who presents with shortness of breath. He is COVID-negative, upon arriving the hospital, temperature 100.7, heart rate 111, respiratory 20.  WBC 12.0. Patient is diagnosed with COPD exacerbation and pneumonia.  Started on Rocephin and Zithromax, Solu-Medrol and bronchodilator.  Patient is also requiring 5 L oxygen for acute respiratory failure.  Per nsg, pt went to bathroom, and on RA 02 sat 78%. No overnight issues  Assessment & Plan:   Principal Problem:   CAP (community acquired pneumonia) Active Problems:   Hypothyroidism   Paroxysmal atrial fibrillation (HCC)   BPH (benign prostatic hyperplasia)   Acute respiratory failure with hypoxia (HCC)   CKD (chronic kidney disease), stage IIIa   Sepsis ([place])   COPD exacerbation (HCC)  Acute respiratory failure with hypoxemia secondary to pneumonia and COPD exacerbation. Sepsis secondary to pneumonia. COPD exacerbation. Left upper lobe pneumonia. Still requiring 5 L oxygen.  I reviewed patient chest x-ray image, previous CT scan of the chest in September.  Patient had a squamous cell carcinoma, CT scan did not show additional cancer.  His currently chest x-ray does not looks like a typical pneumonia. Procalcitonin level is not significantly elevated.  He does not have volume overload. 11/9 continue po steroids IS, flutter valve  Hyperglycemia.  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 BG elevated with steroid A1c 7.8 Will need to f/u with pcp Start lantus 5 unit qd Riss, ck fs   Paroxysm atrial fibrillation. Continue Eliquis and diltiazem   Chronic kidney disease stage IIIa.    Hyponatremia. stable    DVT prophylaxis: Lovenox Code Status: full Family Communication: none at bedside Disposition Plan:      Status is: Inpatient   Remains inpatient appropriate because: Due to severity of disease and IV antibiotics.       I/O last 3 completed shifts: In: 1200 [P.O.:1200] Out: -  No intake/output data recorded.    Consultants:  None   Procedures: None   Antimicrobials: Rocephin and Zithromax.   Subjective: Starting to feel better. Less sob.   Objective: Vitals:   03/09/21 2019 03/09/21 2124 03/10/21 0642 03/10/21 0744  BP:  (!) 142/77 (!) 141/85 136/77  Pulse:  79 65 65  Resp:  16 16 20   Temp:  97.9 F (36.6 C) 97.6 F (36.4 C) (!) 97.5 F (36.4 C)  TempSrc:    Oral  SpO2: 93% 94% 94% 92%  Weight:      Height:        Intake/Output Summary (Last 24 hours) at 03/10/2021 0931 Last data filed at 03/09/2021 1848 Gross per 24 hour  Intake 1200 ml  Output --  Net 1200 ml   Filed Weights   03/07/21 1019 03/07/21 2350  Weight: 122.5 kg 122.2 kg    Examination: Nad, calm Decrease bs no wheezing Reuglar s1s2 no gallop Soft benign +bs No edema Awake and alert    Data Reviewed: I have personally reviewed following labs and imaging studies  CBC: Recent Labs  Lab 03/07/21 1034 03/09/21 0553  WBC 12.0* 14.5*  NEUTROABS  --  13.0*  HGB 16.4 13.6  HCT 49.4 41.9  MCV 94.3 93.5  PLT 168 035   Basic Metabolic Panel:  Recent Labs  Lab 03/07/21 1034 03/09/21 0553  NA 135 132*  K 4.7 4.6  CL 107 103  CO2 20* 20*  GLUCOSE 209* 351*  BUN 19 32*  CREATININE 1.50* 1.50*  CALCIUM 8.9 8.4*   GFR: Estimated Creatinine Clearance: 54 mL/min (A) (by C-G formula based on SCr of 1.5 mg/dL (H)). Liver Function Tests: No results for input(s): AST, ALT, ALKPHOS, BILITOT, PROT, ALBUMIN in the last 168 hours. No results for input(s): LIPASE, AMYLASE in the last 168 hours. No results for input(s): AMMONIA in the last 168  hours. Coagulation Profile: Recent Labs  Lab 03/07/21 1034  INR 1.2   Cardiac Enzymes: No results for input(s): CKTOTAL, CKMB, CKMBINDEX, TROPONINI in the last 168 hours. BNP (last 3 results) No results for input(s): PROBNP in the last 8760 hours. HbA1C: Recent Labs    03/09/21 0553  HGBA1C 7.8*   CBG: Recent Labs  Lab 03/09/21 0826 03/09/21 1217 03/09/21 1619 03/10/21 0742  GLUCAP 288* 288* 317* 240*   Lipid Profile: No results for input(s): CHOL, HDL, LDLCALC, TRIG, CHOLHDL, LDLDIRECT in the last 72 hours. Thyroid Function Tests: No results for input(s): TSH, T4TOTAL, FREET4, T3FREE, THYROIDAB in the last 72 hours. Anemia Panel: No results for input(s): VITAMINB12, FOLATE, FERRITIN, TIBC, IRON, RETICCTPCT in the last 72 hours. Sepsis Labs: Recent Labs  Lab 03/07/21 1533  PROCALCITON 0.12  LATICACIDVEN 1.8    Recent Results (from the past 240 hour(s))  Resp Panel by RT-PCR (Flu A&B, Covid) Nasopharyngeal Swab     Status: None   Collection Time: 03/07/21  3:33 PM   Specimen: Nasopharyngeal Swab; Nasopharyngeal(NP) swabs in vial transport medium  Result Value Ref Range Status   SARS Coronavirus 2 by RT PCR NEGATIVE NEGATIVE Final    Comment: (NOTE) SARS-CoV-2 target nucleic acids are NOT DETECTED.  The SARS-CoV-2 RNA is generally detectable in upper respiratory specimens during the acute phase of infection. The lowest concentration of SARS-CoV-2 viral copies this assay can detect is 138 copies/mL. A negative result does not preclude SARS-Cov-2 infection and should not be used as the sole basis for treatment or other patient management decisions. A negative result may occur with  improper specimen collection/handling, submission of specimen other than nasopharyngeal swab, presence of viral mutation(s) within the areas targeted by this assay, and inadequate number of viral copies(<138 copies/mL). A negative result must be combined with clinical observations,  patient history, and epidemiological information. The expected result is Negative.  Fact Sheet for Patients:  EntrepreneurPulse.com.au  Fact Sheet for Healthcare Providers:  IncredibleEmployment.be  This test is no t yet approved or cleared by the [place] FDA and  has been authorized for detection and/or diagnosis of SARS-CoV-2 by FDA under an Emergency Use Authorization (EUA). This EUA will remain  in effect (meaning this test can be used) for the duration of the COVID-19 declaration under Section 564(b)(1) of the Act, 21 U.S.C.section 360bbb-3(b)(1), unless the authorization is terminated  or revoked sooner.       Influenza A by PCR NEGATIVE NEGATIVE Final   Influenza B by PCR NEGATIVE NEGATIVE Final    Comment: (NOTE) The Xpert Xpress SARS-CoV-2/FLU/RSV plus assay is intended as an aid in the diagnosis of influenza from Nasopharyngeal swab specimens and should not be used as a sole basis for treatment. Nasal washings and aspirates are unacceptable for Xpert Xpress SARS-CoV-2/FLU/RSV testing.  Fact Sheet for Patients: EntrepreneurPulse.com.au  Fact Sheet for Healthcare Providers: IncredibleEmployment.be  This test is not yet approved  or cleared by the [place] and has been authorized for detection and/or diagnosis of SARS-CoV-2 by FDA under an Emergency Use Authorization (EUA). This EUA will remain in effect (meaning this test can be used) for the duration of the COVID-19 declaration under Section 564(b)(1) of the Act, 21 U.S.C. section 360bbb-3(b)(1), unless the authorization is terminated or revoked.  Performed at [facility], [address] [place], [place] [address]   Blood Culture (routine x 2)     Status: None (Preliminary result)   Collection Time: 03/07/21  3:33 PM   Specimen: BLOOD  Result Value Ref Range Status   Specimen Description BLOOD LEFT HAND  Final    Special Requests   Final    BOTTLES DRAWN AEROBIC AND ANAEROBIC Blood Culture adequate volume   Culture   Final    NO GROWTH 3 DAYS Performed at [facility], [address] [place], [place] [address]    Report Status PENDING  Incomplete  Blood Culture (routine x 2)     Status: None (Preliminary result)   Collection Time: 03/07/21  3:33 PM   Specimen: BLOOD  Result Value Ref Range Status   Specimen Description BLOOD RIGHT HAND  Final   Special Requests   Final    BOTTLES DRAWN AEROBIC AND ANAEROBIC Blood Culture results may not be optimal due to an inadequate volume of blood received in culture bottles   Culture   Final    NO GROWTH 3 DAYS Performed at [facility], [address] [place], [place] [address]    Report Status PENDING  Incomplete  Respiratory (~20 pathogens) panel by PCR     Status: None   Collection Time: 03/07/21  3:33 PM   Specimen: Nasopharyngeal Swab; Respiratory  Result Value Ref Range Status   Adenovirus NOT DETECTED NOT DETECTED Final   Coronavirus 229E NOT DETECTED NOT DETECTED Final    Comment: (NOTE) The Coronavirus on the Respiratory Panel, DOES NOT test for the novel  Coronavirus (2019 nCoV)    Coronavirus HKU1 NOT DETECTED NOT DETECTED Final   Coronavirus NL63 NOT DETECTED NOT DETECTED Final   Coronavirus OC43 NOT DETECTED NOT DETECTED Final   Metapneumovirus NOT DETECTED NOT DETECTED Final   Rhinovirus / Enterovirus NOT DETECTED NOT DETECTED Final   Influenza A NOT DETECTED NOT DETECTED Final   Influenza B NOT DETECTED NOT DETECTED Final   Parainfluenza Virus 1 NOT DETECTED NOT DETECTED Final   Parainfluenza Virus 2 NOT DETECTED NOT DETECTED Final   Parainfluenza Virus 3 NOT DETECTED NOT DETECTED Final   Parainfluenza Virus 4 NOT DETECTED NOT DETECTED Final   Respiratory Syncytial Virus NOT DETECTED NOT DETECTED Final   Bordetella pertussis NOT DETECTED NOT DETECTED Final   Bordetella Parapertussis NOT DETECTED NOT DETECTED  Final   Chlamydophila pneumoniae NOT DETECTED NOT DETECTED Final   Mycoplasma pneumoniae NOT DETECTED NOT DETECTED Final    Comment: Performed at [facility], [address], [place], [place] [address]         Radiology Studies: CT CHEST WO CONTRAST  Result Date: 03/09/2021 CLINICAL DATA:  Lung carcinoma EXAM: CT CHEST WITHOUT CONTRAST TECHNIQUE: Multidetector CT imaging of the chest was performed following the standard protocol without IV contrast. COMPARISON:  Previous studies including CT done on 01/13/2021 and chest radiograph done on FINDINGS: Cardiovascular: Coronary artery calcifications are seen. Minimal pericardial effusion is seen. Mediastinum/Nodes: No new significant lymphadenopathy seen. Lungs/Pleura: Fibrotic changes in the left upper lobe may  be related to previous radiation treatment. Posterior aspect of scarring appears slightly more homogeneous in comparison with the study of 01/13/2021. In image 43 of series 3, there is questionable 7 mm nodular density in central portion of left upper lobe which appears minimally smaller in size. This may be a lung nodule or part of pulmonary fibrosis. There are no new lung nodules. Small linear dense stool is seen in the posterior lower lung fields suggesting subsegmental atelectasis. 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 No new focal pulmonary consolidation is seen. Upper Abdomen: There is 2.5 cm smooth marginated low-density lesion in the upper pole of right kidney suggesting renal cyst. Musculoskeletal: No significant interval change. IMPRESSION: Fibrotic changes in the left upper lung fields may be related to previous radiation treatment. There are no new infiltrates or new nodules in the lung fields. No significant lymphadenopathy seen. COPD.  Coronary artery calcifications are seen. Other findings as described in the body of the report. Electronically Signed   By: Elmer Picker M.D.   On: 03/09/2021 12:41        Scheduled Meds:   apixaban  5 mg Oral BID   azithromycin  500 mg Oral Daily   diltiazem  120 mg Oral Daily   insulin aspart  0-15 Units Subcutaneous TID WC   ipratropium-albuterol  3 mL Nebulization Q6H   levothyroxine  137 mcg Oral Q0600   mometasone-formoterol  2 puff Inhalation BID   predniSONE  40 mg Oral Q breakfast   tamsulosin  0.4 mg Oral Daily   vitamin B-12  1,000 mcg Oral Daily   Continuous Infusions:  cefTRIAXone (ROCEPHIN)  IV 2 g (03/09/21 1638)     LOS: 3 days    Time spent: 35 minutes, more than 50% on COC      Nolberto Hanlon, MD [facility]   To contact the attending provider between 7A-7P or the covering provider during after hours 7P-7A, please log into the web site [email or web address] and access using universal [place] password for that web site. If you do not have the password, please call the hospital operator.  03/10/2021, 9:31 AM

## 2021-03-11 ENCOUNTER — Inpatient Hospital Stay: Payer: No Typology Code available for payment source

## 2021-03-11 DIAGNOSIS — E038 Other specified hypothyroidism: Secondary | ICD-10-CM

## 2021-03-11 DIAGNOSIS — N400 Enlarged prostate without lower urinary tract symptoms: Secondary | ICD-10-CM

## 2021-03-11 LAB — BASIC METABOLIC PANEL
Anion gap: 9 (ref 5–15)
BUN: 38 mg/dL — ABNORMAL HIGH (ref 8–23)
CO2: 20 mmol/L — ABNORMAL LOW (ref 22–32)
Calcium: 8.4 mg/dL — ABNORMAL LOW (ref 8.9–10.3)
Chloride: 102 mmol/L (ref 98–111)
Creatinine, Ser: 1.57 mg/dL — ABNORMAL HIGH (ref 0.61–1.24)
GFR, Estimated: 45 mL/min — ABNORMAL LOW (ref >60)
Glucose, Bld: 261 mg/dL — ABNORMAL HIGH (ref 70–99)
Potassium: 4.2 mmol/L (ref 3.5–5.1)
Sodium: 131 mmol/L — ABNORMAL LOW (ref 135–145)

## 2021-03-11 LAB — GLUCOSE, CAPILLARY
Glucose-Capillary: 133 mg/dL — ABNORMAL HIGH (ref 70–99)
Glucose-Capillary: 157 mg/dL — ABNORMAL HIGH (ref 70–99)
Glucose-Capillary: 242 mg/dL — ABNORMAL HIGH (ref 70–99)

## 2021-03-11 LAB — CBC
HCT: 47.4 % (ref 39.0–52.0)
Hemoglobin: 15.4 g/dL (ref 13.0–17.0)
MCH: 30.3 pg (ref 26.0–34.0)
MCHC: 32.5 g/dL (ref 30.0–36.0)
MCV: 93.1 fL (ref 80.0–100.0)
Platelets: 208 10*3/uL (ref 150–400)
RBC: 5.09 MIL/uL (ref 4.22–5.81)
RDW: 13.5 % (ref 11.5–15.5)
WBC: 9.2 10*3/uL (ref 4.0–10.5)
nRBC: 0 % (ref 0.0–0.2)

## 2021-03-11 LAB — BRAIN NATRIURETIC PEPTIDE: B Natriuretic Peptide: 44.5 pg/mL (ref 0.0–100.0)

## 2021-03-11 MED ORDER — DILTIAZEM HCL ER 90 MG PO CP12
90.0000 mg | ORAL_CAPSULE | Freq: Every day | ORAL | Status: DC
  Administered 2021-03-12 – 2021-03-13 (×2): 90 mg via ORAL
  Filled 2021-03-11 (×2): qty 1

## 2021-03-11 NOTE — Progress Notes (Signed)
Telemetry called and stated that patient had a 1st degree heart block and O2 sats dropped to 85%. Patient was in bathroom during this time. Made MD aware of same.

## 2021-03-11 NOTE — Progress Notes (Signed)
SATURATION QUALIFICATIONS: (This note is used to comply with regulatory documentation for home oxygen)  Patient Saturations on Room Air at Rest = 88%  Patient Saturations on Room Air while Ambulating = 86%  Patient Saturations on 2 Liters of oxygen while Ambulating = 93%  Please briefly explain why patient needs home oxygen:

## 2021-03-11 NOTE — Progress Notes (Addendum)
Inpatient Diabetes Program Recommendations  AACE/ADA: New Consensus Statement on Inpatient Glycemic Control (2015)  Target Ranges:  Prepandial:   less than 140 mg/dL      Peak postprandial:   less than 180 mg/dL (1-2 hours)      Critically ill patients:  140 - 180 mg/dL   Lab Results  Component Value Date   GLUCAP 157 (H) 03/11/2021   HGBA1C 7.8 (H) 03/09/2021    Review of Glycemic Control Results for Levi, [name] (MRN [id number]) as of 03/11/2021 12:58  Ref. Range 03/10/2021 12:07 03/10/2021 17:16 03/10/2021 21:15 03/11/2021 08:06 03/11/2021 12:11  Glucose-Capillary Latest Ref Range: 70 - 99 mg/dL 249 (H) 243 (H) 164 (H) 133 (H) 157 (H)   Diabetes history: New Diagnosis of DM Outpatient Diabetes medications:  None Current orders for Inpatient glycemic control:  Novolog 0-15 units tid with meals Prednisone 40 mg daily  Inpatient Diabetes Program Recommendations:    At discharge consider Amaryl 2 mg daily and possibly Metformin if appropriate?  A1C and elevated blood sugars discussed with patient on 03/10/21 by diabetes coordinator.  Wife has diabetes and they are knowledgeable regarding monitoring blood sugars. Will follow.   Thanks,  Adah Perl, RN, BC-ADM Inpatient Diabetes Coordinator Pager [phone]  (8a-5p)  Addendum 1500: Visited briefly with patient regarding DM diagnosis.  Wife is familiar and states she will check his blood sugars and have him f/u with PCP.  We discussed A1C results and that MD will likely start PO DM medication.  He has LWWD booklet by bedside.

## 2021-03-11 NOTE — TOC Initial Note (Signed)
Transition of Care [facility]) - Initial/Assessment Note    Patient Details  Name: Levi [name] MRN: [id number] Date of Birth: 30-Sep-1940  Transition of Care [facility]) CM/SW Contact:    Shelbie Hutching, RN Phone Number: 03/11/2021, 1:18 PM  Clinical Narrative:                 Patient admitted to the hospital with Community acquired pneumonia.  Patient has required oxygen in the hospital and will need to be discharge home on oxygen.  Patient goes to the [facility] in [place] and he gets his prescriptions from the [facility], they deliver them to his home.   RNCM has reached out to the Transfer Coordinator at the [facility] and she reports that the patient's Primary care doctor is Dr. Mancel Bale in [place], the social worker is Drue Stager [phone] ext [phone].  Message left for Berttina to return call as patient will need oxygen and HH RN for medication management.    Patient is from home with his wife in [place], he is independent, he drives.  He is current with the [facility].   Plan is for discharge tomorrow.    Expected Discharge Plan: Home/Self Care Barriers to Discharge: Continued Medical Work up   Patient Goals and CMS Choice Patient states their goals for this hospitalization and ongoing recovery are:: Patient wants to return home at discharge and he agrees to home oxygen CMS Medicare.gov Compare Post Acute Care list provided to:: Patient Choice offered to / list presented to : Patient  Expected Discharge Plan and Services Expected Discharge Plan: Home/Self Care   Discharge Planning Services: CM Consult Post Acute Care Choice: Durable Medical Equipment Living arrangements for the past 2 months: Single Family Home                 DME Arranged: Oxygen         HH Arranged: Refused [place] Agency: NA        Prior Living Arrangements/Services Living arrangements for the past 2 months: Single Family Home Lives with:: Spouse Patient language and need for interpreter  reviewed:: Yes Do you feel safe going back to the place where you live?: Yes      Need for Family Participation in Patient Care: Yes (Comment) Care giver support system in place?: Yes (comment) (wife)   Criminal Activity/Legal Involvement Pertinent to Current Situation/Hospitalization: No - Comment as needed  Activities of Daily Living Home Assistive Devices/Equipment: None ADL Screening (condition at time of admission) Patient's cognitive ability adequate to safely complete daily activities?: Yes Is the patient deaf or have difficulty hearing?: Yes Does the patient have difficulty seeing, even when wearing glasses/contacts?: No Does the patient have difficulty concentrating, remembering, or making decisions?: No Patient able to express need for assistance with ADLs?: Yes Does the patient have difficulty dressing or bathing?: No Independently performs ADLs?: Yes (appropriate for developmental age) Does the patient have difficulty walking or climbing stairs?: No Weakness of Legs: None Weakness of Arms/Hands: None  Permission Sought/Granted Permission sought to share information with : Case Manager, Customer service manager, Family Supports Permission granted to share information with : Yes, Verbal Permission Granted  Share Information with NAME: Julious Langlois  Permission granted to share info w AGENCY: [facility] [place] granted to share info w Relationship: spouse  Permission granted to share info w Contact Information: [phone]  Emotional Assessment Appearance:: Appears stated age Attitude/Demeanor/Rapport: Engaged Affect (typically observed): Accepting Orientation: : Oriented to Self, Oriented to Place,  Oriented to  Time, Oriented to Situation Alcohol / Substance Use: Not Applicable Psych Involvement: No (comment)  Admission diagnosis:  CAP (community acquired pneumonia) [J18.9] Community acquired pneumonia of left upper lobe of lung [J18.9] Sepsis, due to  unspecified organism, unspecified whether acute organ dysfunction present [facility]) [A41.9] Patient Active Problem List   Diagnosis Date Noted   BPH (benign prostatic hyperplasia) 03/07/2021   CAP (community acquired pneumonia) 03/07/2021   Acute respiratory failure with hypoxia ([place]) 03/07/2021   CKD (chronic kidney disease), stage IIIa 03/07/2021   Sepsis ([place]) 03/07/2021   COPD exacerbation ([place]) 03/07/2021   Anxiety disorder due to known physiological condition 08/28/2020   Asthma 08/28/2020   Hypothyroidism 08/28/2020   Paroxysmal atrial fibrillation ([place]) 08/28/2020   Depression 02/01/2019   Goals of care, counseling/discussion 11/25/2018   Squamous cell carcinoma lung, left ([place]) 10/27/2018   Nodule of upper lobe of left lung 10/20/2018   COPD (chronic obstructive pulmonary disease) with emphysema ([place]) 10/23/2015   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 [place] [address] Phone: [phone] Fax: [phone]     Social Determinants of Health (SDOH) Interventions    Readmission Risk Interventions No flowsheet data found.

## 2021-03-11 NOTE — Progress Notes (Signed)
PROGRESS NOTE    Levi [name]  HKV:[id number] DOB: 1940/05/29 DOA: 03/07/2021 PCP: [facility]   Chief complaint shortness of breath. Brief Narrative:  Levi [name] is a 80 y.o. male with medical history significant of former smoker, COPD, asthma, hypothyroidism, anxiety, squamous cell lung cancer, BPH, PAF on Eliquis, CKD-3A, who presents with shortness of breath. He is COVID-negative, upon arriving the hospital, temperature 100.7, heart rate 111, respiratory 20.  WBC 12.0. Patient is diagnosed with COPD exacerbation and pneumonia.  Started on Rocephin and Zithromax, Solu-Medrol and bronchodilator.  Patient is also requiring 5 L oxygen for acute respiratory failure.  Per nsg, pt went to bathroom, and on RA 02 sat 78%. No overnight issues  11/10- tele with brady at night. First degree avb, asx  Assessment & Plan:   Principal Problem:   CAP (community acquired pneumonia) Active Problems:   Hypothyroidism   Paroxysmal atrial fibrillation (HCC)   BPH (benign prostatic hyperplasia)   Acute respiratory failure with hypoxia (HCC)   CKD (chronic kidney disease), stage IIIa   Sepsis ([place])   COPD exacerbation (HCC)  Acute respiratory failure with hypoxemia secondary to pneumonia and COPD exacerbation. Sepsis secondary to pneumonia. COPD exacerbation. Left upper lobe pneumonia. Still requiring 5 L oxygen.  I reviewed patient chest x-ray image, previous CT scan of the chest in September.  Patient had a squamous cell carcinoma, CT scan did not show additional cancer.  His currently chest x-ray does not looks like a typical pneumonia. Procalcitonin level is not significantly elevated.  He does not have volume overload. 11/10 clinically improving. Likely will need home 02 for ambulation, will ck ambulatory 02 today and order DME if needed IS , flutter valve Continue steroids Ck cxr   Hyperglycemia.  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 BG elevated with steroid A1c 7.8 11/10  bg better Start on Amaryl 2mg  qd on discharge with f/u with pcp for further mx    Paroxysm atrial fibrillation. Decrease Cardizem to 90 daily as has bradycardia with heart rate in the 30s at times specially evening Needs sleep study as outpatient will defer to primary care Continue Eliquis  Chronic kidney disease stage IIIa.   Hyponatremia.     DVT prophylaxis: Eliquis Code Status: full Family Communication: none at bedside Disposition Plan:      Status is: Inpatient   Remains inpatient appropriate because: Due to severity of disease and IV antibiotics.       I/O last 3 completed shifts: In: 600 [P.O.:600] Out: -  Total I/O In: 240 [P.O.:240] Out: -     Consultants:  None   Procedures: None   Antimicrobials: Rocephin and Zithromax.   Subjective: Starting to feel better and less short of breath.  Not quite at baseline.  With ambulation O2 sats drops in the 80s.  Encouraged him to use his I-S  Objective: Vitals:   03/11/21 0751 03/11/21 0844 03/11/21 1255 03/11/21 1329  BP: 140/81  136/80   Pulse: 64  68   Resp: 16  17   Temp: 98.4 F (36.9 C)  98.2 F (36.8 C)   TempSrc: Oral  Oral   SpO2: 91% 91%  91%  Weight:      Height:        Intake/Output Summary (Last 24 hours) at 03/11/2021 1401 Last data filed at 03/11/2021 1015 Gross per 24 hour  Intake 360 ml  Output --  Net 360 ml   Filed Weights   03/07/21 1019 03/07/21 2350  Weight: 122.5 kg 122.2 kg    Examination: NAD, calm Minimal scattered Rales no wheezing Regular S1-S2 no gallops Soft benign positive bowel sounds Trace pedal edema bilaterally aaxox3    Data Reviewed: I have personally reviewed following labs and imaging studies  CBC: Recent Labs  Lab 03/07/21 1034 03/09/21 0553  WBC 12.0* 14.5*  NEUTROABS  --  13.0*  HGB 16.4 13.6  HCT 49.4 41.9  MCV 94.3 93.5  PLT 168 093   Basic Metabolic Panel: Recent Labs  Lab 03/07/21 1034 03/09/21 0553  NA 135 132*  K  4.7 4.6  CL 107 103  CO2 20* 20*  GLUCOSE 209* 351*  BUN 19 32*  CREATININE 1.50* 1.50*  CALCIUM 8.9 8.4*   GFR: Estimated Creatinine Clearance: 54 mL/min (A) (by C-G formula based on SCr of 1.5 mg/dL (H)). Liver Function Tests: No results for input(s): AST, ALT, ALKPHOS, BILITOT, PROT, ALBUMIN in the last 168 hours. No results for input(s): LIPASE, AMYLASE in the last 168 hours. No results for input(s): AMMONIA in the last 168 hours. Coagulation Profile: Recent Labs  Lab 03/07/21 1034  INR 1.2   Cardiac Enzymes: No results for input(s): CKTOTAL, CKMB, CKMBINDEX, TROPONINI in the last 168 hours. BNP (last 3 results) No results for input(s): PROBNP in the last 8760 hours. HbA1C: Recent Labs    03/09/21 0553  HGBA1C 7.8*   CBG: Recent Labs  Lab 03/10/21 1207 03/10/21 1716 03/10/21 2115 03/11/21 0806 03/11/21 1211  GLUCAP 249* 243* 164* 133* 157*   Lipid Profile: No results for input(s): CHOL, HDL, LDLCALC, TRIG, CHOLHDL, LDLDIRECT in the last 72 hours. Thyroid Function Tests: No results for input(s): TSH, T4TOTAL, FREET4, T3FREE, THYROIDAB in the last 72 hours. Anemia Panel: No results for input(s): VITAMINB12, FOLATE, FERRITIN, TIBC, IRON, RETICCTPCT in the last 72 hours. Sepsis Labs: Recent Labs  Lab 03/07/21 1533  PROCALCITON 0.12  LATICACIDVEN 1.8    Recent Results (from the past 240 hour(s))  Resp Panel by RT-PCR (Flu A&B, Covid) Nasopharyngeal Swab     Status: None   Collection Time: 03/07/21  3:33 PM   Specimen: Nasopharyngeal Swab; Nasopharyngeal(NP) swabs in vial transport medium  Result Value Ref Range Status   SARS Coronavirus 2 by RT PCR NEGATIVE NEGATIVE Final    Comment: (NOTE) SARS-CoV-2 target nucleic acids are NOT DETECTED.  The SARS-CoV-2 RNA is generally detectable in upper respiratory specimens during the acute phase of infection. The lowest concentration of SARS-CoV-2 viral copies this assay can detect is 138 copies/mL. A negative  result does not preclude SARS-Cov-2 infection and should not be used as the sole basis for treatment or other patient management decisions. A negative result may occur with  improper specimen collection/handling, submission of specimen other than nasopharyngeal swab, presence of viral mutation(s) within the areas targeted by this assay, and inadequate number of viral copies(<138 copies/mL). A negative result must be combined with clinical observations, patient history, and epidemiological information. The expected result is Negative.  Fact Sheet for Patients:  EntrepreneurPulse.com.au  Fact Sheet for Healthcare Providers:  IncredibleEmployment.be  This test is no t yet approved or cleared by the [place] FDA and  has been authorized for detection and/or diagnosis of SARS-CoV-2 by FDA under an Emergency Use Authorization (EUA). This EUA will remain  in effect (meaning this test can be used) for the duration of the COVID-19 declaration under Section 564(b)(1) of the Act, 21 U.S.C.section 360bbb-3(b)(1), unless the authorization is terminated  or revoked sooner.  Influenza A by PCR NEGATIVE NEGATIVE Final   Influenza B by PCR NEGATIVE NEGATIVE Final    Comment: (NOTE) The Xpert Xpress SARS-CoV-2/FLU/RSV plus assay is intended as an aid in the diagnosis of influenza from Nasopharyngeal swab specimens and should not be used as a sole basis for treatment. Nasal washings and aspirates are unacceptable for Xpert Xpress SARS-CoV-2/FLU/RSV testing.  Fact Sheet for Patients: EntrepreneurPulse.com.au  Fact Sheet for Healthcare Providers: IncredibleEmployment.be  This test is not yet approved or cleared by the [place] FDA and has been authorized for detection and/or diagnosis of SARS-CoV-2 by FDA under an Emergency Use Authorization (EUA). This EUA will remain in effect (meaning this test can be used)  for the duration of the COVID-19 declaration under Section 564(b)(1) of the Act, 21 U.S.C. section 360bbb-3(b)(1), unless the authorization is terminated or revoked.  Performed at [facility], [address] [place], [place] [address]   Blood Culture (routine x 2)     Status: None (Preliminary result)   Collection Time: 03/07/21  3:33 PM   Specimen: BLOOD  Result Value Ref Range Status   Specimen Description BLOOD LEFT HAND  Final   Special Requests   Final    BOTTLES DRAWN AEROBIC AND ANAEROBIC Blood Culture adequate volume   Culture   Final    NO GROWTH 4 DAYS Performed at [facility], [address] [place], [place] [address]    Report Status PENDING  Incomplete  Blood Culture (routine x 2)     Status: None (Preliminary result)   Collection Time: 03/07/21  3:33 PM   Specimen: BLOOD  Result Value Ref Range Status   Specimen Description BLOOD RIGHT HAND  Final   Special Requests   Final    BOTTLES DRAWN AEROBIC AND ANAEROBIC Blood Culture results may not be optimal due to an inadequate volume of blood received in culture bottles   Culture   Final    NO GROWTH 4 DAYS Performed at [facility], [address] [place], [place] [address]    Report Status PENDING  Incomplete  Respiratory (~20 pathogens) panel by PCR     Status: None   Collection Time: 03/07/21  3:33 PM   Specimen: Nasopharyngeal Swab; Respiratory  Result Value Ref Range Status   Adenovirus NOT DETECTED NOT DETECTED Final   Coronavirus 229E NOT DETECTED NOT DETECTED Final    Comment: (NOTE) The Coronavirus on the Respiratory Panel, DOES NOT test for the novel  Coronavirus (2019 nCoV)    Coronavirus HKU1 NOT DETECTED NOT DETECTED Final   Coronavirus NL63 NOT DETECTED NOT DETECTED Final   Coronavirus OC43 NOT DETECTED NOT DETECTED Final   Metapneumovirus NOT DETECTED NOT DETECTED Final   Rhinovirus / Enterovirus NOT DETECTED NOT DETECTED Final   Influenza A NOT DETECTED NOT  DETECTED Final   Influenza B NOT DETECTED NOT DETECTED Final   Parainfluenza Virus 1 NOT DETECTED NOT DETECTED Final   Parainfluenza Virus 2 NOT DETECTED NOT DETECTED Final   Parainfluenza Virus 3 NOT DETECTED NOT DETECTED Final   Parainfluenza Virus 4 NOT DETECTED NOT DETECTED Final   Respiratory Syncytial Virus NOT DETECTED NOT DETECTED Final   Bordetella pertussis NOT DETECTED NOT DETECTED Final   Bordetella Parapertussis NOT DETECTED NOT DETECTED Final   Chlamydophila pneumoniae NOT DETECTED NOT DETECTED Final   Mycoplasma pneumoniae NOT DETECTED NOT DETECTED Final    Comment: Performed at [facility], [place] [address] [place], [place] [address]  Expectorated  Sputum Assessment w Gram Stain, Rflx to Resp Cult     Status: None   Collection Time: 03/10/21 11:10 AM   Specimen: Expectorated Sputum  Result Value Ref Range Status   Specimen Description EXPECTORATED SPUTUM  Final   Special Requests NONE  Final   Sputum evaluation   Final    THIS SPECIMEN IS ACCEPTABLE FOR SPUTUM CULTURE Performed at [facility], [address] [place], [place] [address]    Report Status 03/10/2021 FINAL  Final  Culture, Respiratory w Gram Stain     Status: None (Preliminary result)   Collection Time: 03/10/21 11:10 AM  Result Value Ref Range Status   Specimen Description   Final    EXPECTORATED SPUTUM Performed at [facility], [address] [place], [place] [address]    Special Requests   Final    NONE Reflexed from [id number] Performed at [facility], [address] [place], [place] [address]    Gram Stain   Final    FEW WBC PRESENT, PREDOMINANTLY PMN RARE GRAM NEGATIVE COCCOBACILLI RARE GRAM POSITIVE COCCI IN CHAINS    Culture   Final    NO GROWTH < 24 HOURS Performed at [facility], [place] [address], [place], [place] [address]    Report Status PENDING  Incomplete         Radiology Studies: No results found.      Scheduled Meds:   apixaban  5 mg Oral BID   diltiazem  120 mg Oral Daily   insulin aspart  0-15 Units Subcutaneous TID WC   insulin glargine-yfgn  5 Units Subcutaneous Daily   ipratropium-albuterol  3 mL Nebulization Q6H   levothyroxine  137 mcg Oral Q0600   mometasone-formoterol  2 puff Inhalation BID   pantoprazole  20 mg Oral Daily   predniSONE  40 mg Oral Q breakfast   tamsulosin  0.8 mg Oral Daily   vitamin B-12  1,000 mcg Oral Daily   Continuous Infusions:  cefTRIAXone (ROCEPHIN)  IV Stopped (03/11/21 0014)     LOS: 4 days    Time spent: 35 minutes, more than 50% on COC      Nolberto Hanlon, MD [facility]   To contact the attending provider between 7A-7P or the covering provider during after hours 7P-7A, please log into the web site [email or web address] and access using universal [place] password for that web site. If you do not have the password, please call the hospital operator.  03/11/2021, 2:01 PM

## 2021-03-12 DIAGNOSIS — E039 Hypothyroidism, unspecified: Secondary | ICD-10-CM

## 2021-03-12 DIAGNOSIS — N1831 Chronic kidney disease, stage 3a: Secondary | ICD-10-CM

## 2021-03-12 LAB — CULTURE, BLOOD (ROUTINE X 2)
Culture: NO GROWTH
Culture: NO GROWTH
Special Requests: ADEQUATE

## 2021-03-12 LAB — GLUCOSE, CAPILLARY
Glucose-Capillary: 201 mg/dL — ABNORMAL HIGH (ref 70–99)
Glucose-Capillary: 124 mg/dL — ABNORMAL HIGH (ref 70–99)
Glucose-Capillary: 288 mg/dL — ABNORMAL HIGH (ref 70–99)
Glucose-Capillary: 261 mg/dL — ABNORMAL HIGH (ref 70–99)

## 2021-03-12 LAB — BRAIN NATRIURETIC PEPTIDE: B Natriuretic Peptide: 26.2 pg/mL (ref 0.0–100.0)

## 2021-03-12 MED ORDER — PREDNISONE 20 MG PO TABS
20.0000 mg | ORAL_TABLET | Freq: Every day | ORAL | Status: DC
  Administered 2021-03-13: 09:00:00 20 mg via ORAL
  Filled 2021-03-12: qty 1

## 2021-03-12 MED ORDER — FUROSEMIDE 10 MG/ML IJ SOLN
20.0000 mg | Freq: Once | INTRAMUSCULAR | Status: AC
  Administered 2021-03-12: 20 mg via INTRAVENOUS
  Filled 2021-03-12: qty 2

## 2021-03-12 NOTE — Progress Notes (Signed)
PROGRESS NOTE    Levi [name]  ELF:[id number] DOB: 06/26/1940 DOA: 03/07/2021 PCP: [facility]   Chief complaint shortness of breath. Brief Narrative:  Levi [name] is a 80 y.o. male with medical history significant of former smoker, COPD, asthma, hypothyroidism, anxiety, squamous cell lung cancer, BPH, PAF on Eliquis, CKD-3A, who presents with shortness of breath. He is COVID-negative, upon arriving the hospital, temperature 100.7, heart rate 111, respiratory 20.  WBC 12.0. Patient is diagnosed with COPD exacerbation and pneumonia.  Started on Rocephin and Zithromax, Solu-Medrol and bronchodilator.  Patient is also requiring 5 L oxygen for acute respiratory failure.  Per nsg, pt went to bathroom, and on RA 02 sat 78%. No overnight issues  11/10- tele with brady at night. First degree avb, asx 11/11requiring 3L 02 now.   Assessment & Plan:   Principal Problem:   CAP (community acquired pneumonia) Active Problems:   Hypothyroidism   Paroxysmal atrial fibrillation (HCC)   BPH (benign prostatic hyperplasia)   Acute respiratory failure with hypoxia (HCC)   CKD (chronic kidney disease), stage IIIa   Sepsis ([place])   COPD exacerbation (HCC)  Acute respiratory failure with hypoxemia secondary to pneumonia and COPD exacerbation. Sepsis secondary to pneumonia. COPD exacerbation. Left upper lobe pneumonia.  chest x-ray image, previous CT scan of the chest in September.  Patient had a squamous cell carcinoma, CT scan did not show additional cancer.  His currently chest x-ray does not looks like a typical pneumonia. Procalcitonin level is not significantly elevated.   11/11 will give lasix 20mg  iv x1 Start prednisone taper in am Completed abx course with ceft/azith IS, flutter valve Home 02 ordered Keep 02 >92%  Hyperglycemia.  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 A1c 7.8 11/11 BG better Will start on Amaryl 2mg  qd on discharge with f/u with f/u with pcp for further  mx      Paroxysm atrial fibrillation. Decreased Cardizem to 90 daily as has bradycardia with heart rate in the 30s at times specially evening 11/11 continue cardizem Sleep study as outpt , defer to pcp Continue eliuqis   Chronic kidney disease stage IIIa.   Hyponatremia. Stable Monitor after lasix    DVT prophylaxis: Eliquis Code Status: full Family Communication: none at bedside Disposition Plan:      Status is: Inpatient   Remains inpatient appropriate because: Due to severity of disease and needing iv treatment       I/O last 3 completed shifts: In: 53 [P.O.:720] Out: -  Total I/O In: 480 [P.O.:480] Out: -     Consultants:  None   Procedures: None   Antimicrobials: Rocephin and Zithromax.-completed   Subjective: Sob little better, not at baseline. No cp   Objective: Vitals:   03/12/21 0407 03/12/21 0818 03/12/21 0926 03/12/21 1153  BP: 132/79 139/74 124/70 116/76  Pulse: 65 63 72 78  Resp: 14 18  17   Temp: 98.2 F (36.8 C) 98.5 F (36.9 C)  98.1 F (36.7 C)  TempSrc: Oral Oral    SpO2: 93% 91% 90% 94%  Weight:      Height:        Intake/Output Summary (Last 24 hours) at 03/12/2021 1445 Last data filed at 03/12/2021 1337 Gross per 24 hour  Intake 720 ml  Output --  Net 720 ml   Filed Weights   03/07/21 1019 03/07/21 2350  Weight: 122.5 kg 122.2 kg    Examination: Calm, nad Crackles at bases, no wheezing Reg s1/s2 no gallop Soft  benign +bs No edema aaxoxo3    Data Reviewed: I have personally reviewed following labs and imaging studies  CBC: Recent Labs  Lab 03/07/21 1034 03/09/21 0553 03/11/21 1419  WBC 12.0* 14.5* 9.2  NEUTROABS  --  13.0*  --   HGB 16.4 13.6 15.4  HCT 49.4 41.9 47.4  MCV 94.3 93.5 93.1  PLT 168 186 169   Basic Metabolic Panel: Recent Labs  Lab 03/07/21 1034 03/09/21 0553 03/11/21 1419  NA 135 132* 131*  K 4.7 4.6 4.2  CL 107 103 102  CO2 20* 20* 20*  GLUCOSE 209* 351* 261*  BUN  19 32* 38*  CREATININE 1.50* 1.50* 1.57*  CALCIUM 8.9 8.4* 8.4*   GFR: Estimated Creatinine Clearance: 51.6 mL/min (A) (by C-G formula based on SCr of 1.57 mg/dL (H)). Liver Function Tests: No results for input(s): AST, ALT, ALKPHOS, BILITOT, PROT, ALBUMIN in the last 168 hours. No results for input(s): LIPASE, AMYLASE in the last 168 hours. No results for input(s): AMMONIA in the last 168 hours. Coagulation Profile: Recent Labs  Lab 03/07/21 1034  INR 1.2   Cardiac Enzymes: No results for input(s): CKTOTAL, CKMB, CKMBINDEX, TROPONINI in the last 168 hours. BNP (last 3 results) No results for input(s): PROBNP in the last 8760 hours. HbA1C: No results for input(s): HGBA1C in the last 72 hours.  CBG: Recent Labs  Lab 03/11/21 0806 03/11/21 1211 03/11/21 1646 03/12/21 0904 03/12/21 1151  GLUCAP 133* 157* 242* 201* 124*   Lipid Profile: No results for input(s): CHOL, HDL, LDLCALC, TRIG, CHOLHDL, LDLDIRECT in the last 72 hours. Thyroid Function Tests: No results for input(s): TSH, T4TOTAL, FREET4, T3FREE, THYROIDAB in the last 72 hours. Anemia Panel: No results for input(s): VITAMINB12, FOLATE, FERRITIN, TIBC, IRON, RETICCTPCT in the last 72 hours. Sepsis Labs: Recent Labs  Lab 03/07/21 1533  PROCALCITON 0.12  LATICACIDVEN 1.8    Recent Results (from the past 240 hour(s))  Resp Panel by RT-PCR (Flu A&B, Covid) Nasopharyngeal Swab     Status: None   Collection Time: 03/07/21  3:33 PM   Specimen: Nasopharyngeal Swab; Nasopharyngeal(NP) swabs in vial transport medium  Result Value Ref Range Status   SARS Coronavirus 2 by RT PCR NEGATIVE NEGATIVE Final    Comment: (NOTE) SARS-CoV-2 target nucleic acids are NOT DETECTED.  The SARS-CoV-2 RNA is generally detectable in upper respiratory specimens during the acute phase of infection. The lowest concentration of SARS-CoV-2 viral copies this assay can detect is 138 copies/mL. A negative result does not preclude  SARS-Cov-2 infection and should not be used as the sole basis for treatment or other patient management decisions. A negative result may occur with  improper specimen collection/handling, submission of specimen other than nasopharyngeal swab, presence of viral mutation(s) within the areas targeted by this assay, and inadequate number of viral copies(<138 copies/mL). A negative result must be combined with clinical observations, patient history, and epidemiological information. The expected result is Negative.  Fact Sheet for Patients:  EntrepreneurPulse.com.au  Fact Sheet for Healthcare Providers:  IncredibleEmployment.be  This test is no t yet approved or cleared by the [place] FDA and  has been authorized for detection and/or diagnosis of SARS-CoV-2 by FDA under an Emergency Use Authorization (EUA). This EUA will remain  in effect (meaning this test can be used) for the duration of the COVID-19 declaration under Section 564(b)(1) of the Act, 21 U.S.C.section 360bbb-3(b)(1), unless the authorization is terminated  or revoked sooner.       Influenza  A by PCR NEGATIVE NEGATIVE Final   Influenza B by PCR NEGATIVE NEGATIVE Final    Comment: (NOTE) The Xpert Xpress SARS-CoV-2/FLU/RSV plus assay is intended as an aid in the diagnosis of influenza from Nasopharyngeal swab specimens and should not be used as a sole basis for treatment. Nasal washings and aspirates are unacceptable for Xpert Xpress SARS-CoV-2/FLU/RSV testing.  Fact Sheet for Patients: EntrepreneurPulse.com.au  Fact Sheet for Healthcare Providers: IncredibleEmployment.be  This test is not yet approved or cleared by the [place] FDA and has been authorized for detection and/or diagnosis of SARS-CoV-2 by FDA under an Emergency Use Authorization (EUA). This EUA will remain in effect (meaning this test can be used) for the duration of  the COVID-19 declaration under Section 564(b)(1) of the Act, 21 U.S.C. section 360bbb-3(b)(1), unless the authorization is terminated or revoked.  Performed at [facility], [address] [place], [place] [address]   Blood Culture (routine x 2)     Status: None   Collection Time: 03/07/21  3:33 PM   Specimen: BLOOD  Result Value Ref Range Status   Specimen Description BLOOD LEFT HAND  Final   Special Requests   Final    BOTTLES DRAWN AEROBIC AND ANAEROBIC Blood Culture adequate volume   Culture   Final    NO GROWTH 5 DAYS Performed at [facility], [address] [place], [place] [address]    Report Status 03/12/2021 FINAL  Final  Blood Culture (routine x 2)     Status: None   Collection Time: 03/07/21  3:33 PM   Specimen: BLOOD  Result Value Ref Range Status   Specimen Description BLOOD RIGHT HAND  Final   Special Requests   Final    BOTTLES DRAWN AEROBIC AND ANAEROBIC Blood Culture results may not be optimal due to an inadequate volume of blood received in culture bottles   Culture   Final    NO GROWTH 5 DAYS Performed at [facility], [address] [place], [place] [address]    Report Status 03/12/2021 FINAL  Final  Respiratory (~20 pathogens) panel by PCR     Status: None   Collection Time: 03/07/21  3:33 PM   Specimen: Nasopharyngeal Swab; Respiratory  Result Value Ref Range Status   Adenovirus NOT DETECTED NOT DETECTED Final   Coronavirus 229E NOT DETECTED NOT DETECTED Final    Comment: (NOTE) The Coronavirus on the Respiratory Panel, DOES NOT test for the novel  Coronavirus (2019 nCoV)    Coronavirus HKU1 NOT DETECTED NOT DETECTED Final   Coronavirus NL63 NOT DETECTED NOT DETECTED Final   Coronavirus OC43 NOT DETECTED NOT DETECTED Final   Metapneumovirus NOT DETECTED NOT DETECTED Final   Rhinovirus / Enterovirus NOT DETECTED NOT DETECTED Final   Influenza A NOT DETECTED NOT DETECTED Final   Influenza B NOT DETECTED NOT DETECTED  Final   Parainfluenza Virus 1 NOT DETECTED NOT DETECTED Final   Parainfluenza Virus 2 NOT DETECTED NOT DETECTED Final   Parainfluenza Virus 3 NOT DETECTED NOT DETECTED Final   Parainfluenza Virus 4 NOT DETECTED NOT DETECTED Final   Respiratory Syncytial Virus NOT DETECTED NOT DETECTED Final   Bordetella pertussis NOT DETECTED NOT DETECTED Final   Bordetella Parapertussis NOT DETECTED NOT DETECTED Final   Chlamydophila pneumoniae NOT DETECTED NOT DETECTED Final   Mycoplasma pneumoniae NOT DETECTED NOT DETECTED Final    Comment: Performed at [facility], [place] [address] [place], [place] [address]  Expectorated Sputum Assessment w  Gram Stain, Rflx to Resp Cult     Status: None   Collection Time: 03/10/21 11:10 AM   Specimen: Expectorated Sputum  Result Value Ref Range Status   Specimen Description EXPECTORATED SPUTUM  Final   Special Requests NONE  Final   Sputum evaluation   Final    THIS SPECIMEN IS ACCEPTABLE FOR SPUTUM CULTURE Performed at [facility], [address], [place], [place] [address]    Report Status 03/10/2021 FINAL  Final  Culture, Respiratory w Gram Stain     Status: None (Preliminary result)   Collection Time: 03/10/21 11:10 AM  Result Value Ref Range Status   Specimen Description   Final    EXPECTORATED SPUTUM Performed at [facility], [address] [place], [place] [address]    Special Requests   Final    NONE Reflexed from [id number] Performed at [facility], [address] [place], [place] [address]    Gram Stain   Final    FEW WBC PRESENT, PREDOMINANTLY PMN RARE GRAM NEGATIVE COCCOBACILLI RARE GRAM POSITIVE COCCI IN CHAINS    Culture   Final    CULTURE REINCUBATED FOR BETTER GROWTH Performed at [facility], [place] [address] [place], [place] [address]    Report Status PENDING  Incomplete         Radiology Studies: DG Chest Port 1 View  Result Date: 03/11/2021 CLINICAL DATA:  Pneumonia EXAM: PORTABLE  CHEST 1 VIEW COMPARISON:  03/07/2021.  03/09/2021. FINDINGS: mild cardiomegaly. Mediastinal shadows are normal. Chronic volume loss in the left upper lobe as seen previously. No sign of acute infiltrate, collapse or effusion. No significant bone finding. IMPRESSION: Chronic scarring in the left upper lobe. Cardiomegaly. No focal finding otherwise. Electronically Signed   By: Nelson Chimes M.D.   On: 03/11/2021 14:31        Scheduled Meds:  apixaban  5 mg Oral BID   diltiazem  90 mg Oral Daily   insulin aspart  0-15 Units Subcutaneous TID WC   insulin glargine-yfgn  5 Units Subcutaneous Daily   ipratropium-albuterol  3 mL Nebulization Q6H   levothyroxine  137 mcg Oral Q0600   mometasone-formoterol  2 puff Inhalation BID   pantoprazole  20 mg Oral Daily   [START ON 03/13/2021] predniSONE  20 mg Oral Q breakfast   tamsulosin  0.8 mg Oral Daily   vitamin B-12  1,000 mcg Oral Daily   Continuous Infusions:     LOS: 5 days    Time spent: 35 minutes, more than 50% on [place], MD [facility]   To contact the attending provider between 7A-7P or the covering provider during after hours 7P-7A, please log into the web site [email or web address] and access using universal [place] password for that web site. If you do not have the password, please call the hospital operator.  03/12/2021, 2:45 PM

## 2021-03-12 NOTE — Progress Notes (Signed)
Pt sats dropped to 84% on 2L [place] while sleeping. Increased O2 to 3L and sats improved top 90%. Will continue to monitor

## 2021-03-12 NOTE — TOC Progression Note (Signed)
Transition of Care [facility]) - Progression Note    Patient Details  Name: MARKIES MOWATT MRN: [id number] Date of Birth: 04-[name]-1941  Transition of Care [facility]) CM/SW Contact  Shelbie Hutching, RN Phone Number: 03/12/2021, 9:02 AM  Clinical Narrative:    Patient qualifies for home oxygen.  Patient has VA coverage and [facility] Medicare.  [facility] to the hospital.  Unfortunately they are closed today for the [employer] so oxygen cannot be ordered from the [place] today.  [employer] can use the patient's [facility] Medicare to cover the oxygen.  Clinicals faxed to the [place].     Expected Discharge Plan: Home/Self Care Barriers to Discharge: Continued Medical Work up  Expected Discharge Plan and Services Expected Discharge Plan: Home/Self Care   Discharge Planning Services: CM Consult Post Acute Care Choice: Durable Medical Equipment Living arrangements for the past 2 months: Single Family Home                 DME Arranged: Oxygen DME Agency: [facility] Date DME Agency Contacted: 03/12/21 Time DME Agency Contacted: [phone] Representative spoke with at DME Agency: [place] Arranged: Refused [place] Agency: NA         Social Determinants of Health ([place]) Interventions    Readmission Risk Interventions No flowsheet data found.

## 2021-03-13 DIAGNOSIS — R6520 Severe sepsis without septic shock: Secondary | ICD-10-CM

## 2021-03-13 DIAGNOSIS — A419 Sepsis, unspecified organism: Principal | ICD-10-CM

## 2021-03-13 LAB — BASIC METABOLIC PANEL
Anion gap: 7 (ref 5–15)
BUN: 35 mg/dL — ABNORMAL HIGH (ref 8–23)
CO2: 25 mmol/L (ref 22–32)
Calcium: 8.6 mg/dL — ABNORMAL LOW (ref 8.9–10.3)
Chloride: 103 mmol/L (ref 98–111)
Creatinine, Ser: 1.48 mg/dL — ABNORMAL HIGH (ref 0.61–1.24)
GFR, Estimated: 48 mL/min — ABNORMAL LOW (ref >60)
Glucose, Bld: 199 mg/dL — ABNORMAL HIGH (ref 70–99)
Potassium: 4.2 mmol/L (ref 3.5–5.1)
Sodium: 135 mmol/L (ref 135–145)

## 2021-03-13 LAB — GLUCOSE, CAPILLARY
Glucose-Capillary: 170 mg/dL — ABNORMAL HIGH (ref 70–99)
Glucose-Capillary: 167 mg/dL — ABNORMAL HIGH (ref 70–99)

## 2021-03-13 LAB — CULTURE, RESPIRATORY W GRAM STAIN: Culture: NORMAL

## 2021-03-13 MED ORDER — PANTOPRAZOLE SODIUM 20 MG PO TBEC: 20.0000 mg | DELAYED_RELEASE_TABLET | Freq: Every day | ORAL | 0 refills | Status: DC

## 2021-03-13 MED ORDER — GLIMEPIRIDE 2 MG PO TABS: 2.0000 mg | ORAL_TABLET | ORAL | 0 refills | Status: DC

## 2021-03-13 MED ORDER — DILTIAZEM HCL ER 90 MG PO CP12: 90.0000 mg | ORAL_CAPSULE | Freq: Every day | ORAL | 0 refills | Status: DC

## 2021-03-13 MED ORDER — TAMSULOSIN HCL 0.4 MG PO CAPS: 0.8000 mg | ORAL_CAPSULE | Freq: Every day | ORAL | 0 refills | Status: AC

## 2021-03-13 MED ORDER — DM-GUAIFENESIN ER 30-600 MG PO TB12: 1.0000 | ORAL_TABLET | Freq: Two times a day (BID) | ORAL | 0 refills | Status: AC | PRN

## 2021-03-13 MED ORDER — FUROSEMIDE 20 MG PO TABS
20.0000 mg | ORAL_TABLET | Freq: Once | ORAL | Status: AC
  Administered 2021-03-13: 12:00:00 20 mg via ORAL
  Filled 2021-03-13: qty 1

## 2021-03-13 MED ORDER — FUROSEMIDE 10 MG/ML IJ SOLN: 20.0000 mg | Freq: Once | INTRAMUSCULAR | Status: DC

## 2021-03-13 MED ORDER — PREDNISONE 20 MG PO TABS: 20.0000 mg | ORAL_TABLET | Freq: Every day | ORAL | 0 refills | Status: AC

## 2021-03-13 NOTE — Discharge Summary (Signed)
Levi [name] VOZ:[id number] DOB: Nov 23, 1940 DOA: 03/07/2021  PCP: [facility]  Admit date: 03/07/2021 Discharge date: 03/13/2021  Admitted From: home Disposition:  home  Recommendations for Outpatient Follow-up:  Follow up with PCP in 1 week Please obtain BMP/CBC in one week     Discharge Condition:Stable CODE STATUS:Full  Diet recommendation: Heart Healthy / Carb Modified  Brief/Interim Summary: Per HPI: Levi [name] is a 80 y.o. male with medical history significant of former smoker, COPD, asthma, hypothyroidism, anxiety, squamous cell lung cancer, BPH, PAF on Eliquis, CKD-3A, who presented with shortness of breath.COVID-negative, upon arriving the hospital, temperature 100.7, heart rate 111, respiratory 20.  WBC 12.0.Patient is diagnosed with COPD exacerbation and pneumonia. Was started on IV steroids with transition to po and completed antibiotic course. Was also given couple of doses of lasix since was on steroids. He did require home 02 on discharge, and will need to f/u with his pcp for weaning off of 0xygen. Encouraged incentive spirometer use at home. He also was found with bradycardia and his cardizem dose was decreased from 120mg  to 90mg  daily. During his hospitalization he was found to be hyperglycemic . He is doing much better today and stable for discharge.   Acute respiratory failure with hypoxemia secondary to pneumonia and COPD exacerbation. Sepsis secondary to pneumonia. COPD exacerbation. Left upper lobe pneumonia. CT chest with no new nodules. Please see full report below. Was treated with iv steroids with taper to po Completed antibiotics Give lasix prn since was on steroid and retaining some fluid IS encouraged DME for 02 completed, needs to f/u with pcp as outpatient for weaning off home 02 as tolerated.   Hyperglycemia.  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 A1c 7.8 Diabetic educator was consulted Will discharge on Amaryl 2mg  qd, and f/u with pcp  for further management. Discussed this with patient extensively          Paroxysm atrial fibrillation. Decreased Cardizem to 90 daily as had bradycardia with heart rate in the 30s at times especially evening Sleep study as outpt , defer to pcp Continue eliuqis    Chronic kidney disease stage IIIa.   Hyponatremia. Stable, improved with low dose lasix      Discharge Diagnoses:  Principal Problem:   CAP (community acquired pneumonia) Active Problems:   Hypothyroidism   Paroxysmal atrial fibrillation (HCC)   BPH (benign prostatic hyperplasia)   Acute respiratory failure with hypoxia (HCC)   CKD (chronic kidney disease), stage IIIa   Sepsis ([place])   COPD exacerbation ([place])    Discharge Instructions  Discharge Instructions     Call MD for:  difficulty breathing, headache or visual disturbances   Complete by: As directed    Diet - low sodium heart healthy   Complete by: As directed    Diet Carb Modified   Complete by: As directed    Discharge instructions   Complete by: As directed    Follow up with your heart doctor and pcp next week   Increase activity slowly   Complete by: As directed       Allergies as of 03/13/2021   No Known Allergies      Medication List     STOP taking these medications    diltiazem 120 MG 24 hr capsule Commonly known as: DILACOR XR Replaced by: diltiazem 90 MG 12 hr capsule       TAKE these medications    albuterol 108 (90 Base) MCG/ACT inhaler Commonly known as: VENTOLIN HFA  Inhale 2 puffs into the lungs every 6 (six) hours as needed for wheezing or shortness of breath.   ALPRAZolam 0.5 MG tablet Commonly known as: XANAX Take 0.5 mg by mouth at bedtime as needed for anxiety.   apixaban 5 MG Tabs tablet Commonly known as: Eliquis Take 1 tablet (5 mg total) by mouth 2 (two) times daily.   budesonide-formoterol 160-4.5 MCG/ACT inhaler Commonly known as: SYMBICORT Inhale 2 puffs into the lungs 2 (two) times daily.    dextromethorphan-guaiFENesin 30-600 MG 12hr tablet Commonly known as: MUCINEX DM Take 1 tablet by mouth 2 (two) times daily as needed for up to 7 days for cough.   diltiazem 90 MG 12 hr capsule Commonly known as: CARDIZEM SR Take 1 capsule (90 mg total) by mouth daily. Start taking on: March 14, 2021 Replaces: diltiazem 120 MG 24 hr capsule   glimepiride 2 MG tablet Commonly known as: Amaryl Take 1 tablet (2 mg total) by mouth every morning.   levothyroxine 137 MCG tablet Commonly known as: SYNTHROID Take 137 mcg by mouth daily before breakfast.   pantoprazole 20 MG tablet Commonly known as: PROTONIX Take 1 tablet (20 mg total) by mouth daily for 14 days. Start taking on: March 14, 2021   predniSONE 20 MG tablet Commonly known as: DELTASONE Take 1 tablet (20 mg total) by mouth daily with breakfast for 3 days. Start taking on: March 14, 2021   tamsulosin 0.4 MG Caps capsule Commonly known as: FLOMAX Take 2 capsules (0.8 mg total) by mouth daily. Start taking on: March 14, 2021 What changed: how much to take   vitamin B-12 1000 MCG tablet Commonly known as: CYANOCOBALAMIN Take 1,000 mcg by mouth daily.   Vitamin D (Ergocalciferol) 1.25 MG (50000 UNIT) Caps capsule Commonly known as: DRISDOL Take 50,000 Units by mouth every 7 (seven) days.               Durable Medical Equipment  (From admission, onward)           Start     Ordered   03/11/21 1712  For home use only DME oxygen  Once       Question Answer Comment  Length of Need 6 Months   Mode or (Route) Nasal cannula   Liters per Minute 2   Frequency Continuous (stationary and portable oxygen unit needed)   Oxygen conserving device Yes   Oxygen delivery system Gas      03/11/21 [place] Follow up in 1 week(s).   Specialty: General Practice Contact information: [place] [phone]                 No Known Allergies  Consultations:    Procedures/Studies: DG Chest 2 View  Result Date: 03/07/2021 CLINICAL DATA:  Cough and short of breath.  COPD EXAM: CHEST - 2 VIEW COMPARISON:  03/30/2019 FINDINGS: Interval development of left upper lobe infiltrate which may represent pneumonia. COPD with hyperinflation. Prominent lung markings diffusely due to chronic lung disease. Negative for heart failure. Small left pleural effusion or pleural scarring noted. IMPRESSION: COPD.  Left upper lobe infiltrate, probable pneumonia. Electronically Signed   By: Franchot Gallo M.D.   On: 03/07/2021 11:18   CT CHEST WO CONTRAST  Result Date: 03/09/2021 CLINICAL DATA:  Lung carcinoma EXAM: CT CHEST WITHOUT CONTRAST TECHNIQUE: Multidetector CT imaging of the chest  was performed following the standard protocol without IV contrast. COMPARISON:  Previous studies including CT done on 01/13/2021 and chest radiograph done on FINDINGS: Cardiovascular: Coronary artery calcifications are seen. Minimal pericardial effusion is seen. Mediastinum/Nodes: No new significant lymphadenopathy seen. Lungs/Pleura: Fibrotic changes in the left upper lobe may be related to previous radiation treatment. Posterior aspect of scarring appears slightly more homogeneous in comparison with the study of 01/13/2021. In image 43 of series 3, there is questionable 7 mm nodular density in central portion of left upper lobe which appears minimally smaller in size. This may be a lung nodule or part of pulmonary fibrosis. There are no new lung nodules. Small linear dense stool is seen in the posterior lower lung fields suggesting subsegmental atelectasis. 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 No new focal pulmonary consolidation is seen. Upper Abdomen: There is 2.5 cm smooth marginated low-density lesion in the upper pole of right kidney suggesting renal cyst. Musculoskeletal: No significant interval change. IMPRESSION: Fibrotic changes in the  left upper lung fields may be related to previous radiation treatment. There are no new infiltrates or new nodules in the lung fields. No significant lymphadenopathy seen. COPD.  Coronary artery calcifications are seen. Other findings as described in the body of the report. Electronically Signed   By: Elmer Picker M.D.   On: 03/09/2021 12:41   DG Chest Port 1 View  Result Date: 03/11/2021 CLINICAL DATA:  Pneumonia EXAM: PORTABLE CHEST 1 VIEW COMPARISON:  03/07/2021.  03/09/2021. FINDINGS: mild cardiomegaly. Mediastinal shadows are normal. Chronic volume loss in the left upper lobe as seen previously. No sign of acute infiltrate, collapse or effusion. No significant bone finding. IMPRESSION: Chronic scarring in the left upper lobe. Cardiomegaly. No focal finding otherwise. Electronically Signed   By: Nelson Chimes M.D.   On: 03/11/2021 14:31      Subjective: No sob, cp, or abd pain. Breathing is better  Discharge Exam: Vitals:   03/13/21 0446 03/13/21 0828  BP: 137/82 140/79  Pulse: 64 70  Resp: 18 18  Temp: 98 F (36.7 C) 98 F (36.7 C)  SpO2: 94% 92%   Vitals:   03/12/21 1940 03/13/21 0159 03/13/21 0446 03/13/21 0828  BP: 133/74  137/82 140/79  Pulse: 78  64 70  Resp: 20  18 18   Temp: 98.5 F (36.9 C)  98 F (36.7 C) 98 F (36.7 C)  TempSrc: Oral  Oral   SpO2: 93% 93% 94% 92%  Weight:      Height:        General: Pt is alert, awake, not in acute distress Cardiovascular: RRR, S1/S2 +, no rubs, no gallops Respiratory: CTA bilaterally, no wheezing, no rhonchi Abdominal: Soft, NT, ND, bowel sounds + Extremities: no edema, no cyanosis    The results of significant diagnostics from this hospitalization (including imaging, microbiology, ancillary and laboratory) are listed below for reference.     Microbiology: Recent Results (from the past 240 hour(s))  Resp Panel by RT-PCR (Flu A&B, Covid) Nasopharyngeal Swab     Status: None   Collection Time: 03/07/21  3:33 PM    Specimen: Nasopharyngeal Swab; Nasopharyngeal(NP) swabs in vial transport medium  Result Value Ref Range Status   SARS Coronavirus 2 by RT PCR NEGATIVE NEGATIVE Final    Comment: (NOTE) SARS-CoV-2 target nucleic acids are NOT DETECTED.  The SARS-CoV-2 RNA is generally detectable in upper respiratory specimens during the acute phase of infection. The lowest concentration of SARS-CoV-2 viral copies this assay can detect is 138  copies/mL. A negative result does not preclude SARS-Cov-2 infection and should not be used as the sole basis for treatment or other patient management decisions. A negative result may occur with  improper specimen collection/handling, submission of specimen other than nasopharyngeal swab, presence of viral mutation(s) within the areas targeted by this assay, and inadequate number of viral copies(<138 copies/mL). A negative result must be combined with clinical observations, patient history, and epidemiological information. The expected result is Negative.  Fact Sheet for Patients:  EntrepreneurPulse.com.au  Fact Sheet for Healthcare Providers:  IncredibleEmployment.be  This test is no t yet approved or cleared by the [place] FDA and  has been authorized for detection and/or diagnosis of SARS-CoV-2 by FDA under an Emergency Use Authorization (EUA). This EUA will remain  in effect (meaning this test can be used) for the duration of the COVID-19 declaration under Section 564(b)(1) of the Act, 21 U.S.C.section 360bbb-3(b)(1), unless the authorization is terminated  or revoked sooner.       Influenza A by PCR NEGATIVE NEGATIVE Final   Influenza B by PCR NEGATIVE NEGATIVE Final    Comment: (NOTE) The Xpert Xpress SARS-CoV-2/FLU/RSV plus assay is intended as an aid in the diagnosis of influenza from Nasopharyngeal swab specimens and should not be used as a sole basis for treatment. Nasal washings and aspirates are  unacceptable for Xpert Xpress SARS-CoV-2/FLU/RSV testing.  Fact Sheet for Patients: EntrepreneurPulse.com.au  Fact Sheet for Healthcare Providers: IncredibleEmployment.be  This test is not yet approved or cleared by the [place] FDA and has been authorized for detection and/or diagnosis of SARS-CoV-2 by FDA under an Emergency Use Authorization (EUA). This EUA will remain in effect (meaning this test can be used) for the duration of the COVID-19 declaration under Section 564(b)(1) of the Act, 21 U.S.C. section 360bbb-3(b)(1), unless the authorization is terminated or revoked.  Performed at [facility], [address] [place], [place] [address]   Blood Culture (routine x 2)     Status: None   Collection Time: 03/07/21  3:33 PM   Specimen: BLOOD  Result Value Ref Range Status   Specimen Description BLOOD LEFT HAND  Final   Special Requests   Final    BOTTLES DRAWN AEROBIC AND ANAEROBIC Blood Culture adequate volume   Culture   Final    NO GROWTH 5 DAYS Performed at [facility], [address], [place], [place] [address]    Report Status 03/12/2021 FINAL  Final  Blood Culture (routine x 2)     Status: None   Collection Time: 03/07/21  3:33 PM   Specimen: BLOOD  Result Value Ref Range Status   Specimen Description BLOOD RIGHT HAND  Final   Special Requests   Final    BOTTLES DRAWN AEROBIC AND ANAEROBIC Blood Culture results may not be optimal due to an inadequate volume of blood received in culture bottles   Culture   Final    NO GROWTH 5 DAYS Performed at [facility], [address] [place],  [address]    Report Status 03/12/2021 FINAL  Final  Respiratory (~20 pathogens) panel by PCR     Status: None   Collection Time: 03/07/21  3:33 PM   Specimen: Nasopharyngeal Swab; Respiratory  Result Value Ref Range Status   Adenovirus NOT DETECTED NOT DETECTED Final   Coronavirus 229E NOT DETECTED NOT  DETECTED Final    Comment: (NOTE) The Coronavirus on the Respiratory Panel, DOES NOT test for the novel  Coronavirus (2019  nCoV)    Coronavirus HKU1 NOT DETECTED NOT DETECTED Final   Coronavirus NL63 NOT DETECTED NOT DETECTED Final   Coronavirus OC43 NOT DETECTED NOT DETECTED Final   Metapneumovirus NOT DETECTED NOT DETECTED Final   Rhinovirus / Enterovirus NOT DETECTED NOT DETECTED Final   Influenza A NOT DETECTED NOT DETECTED Final   Influenza B NOT DETECTED NOT DETECTED Final   Parainfluenza Virus 1 NOT DETECTED NOT DETECTED Final   Parainfluenza Virus 2 NOT DETECTED NOT DETECTED Final   Parainfluenza Virus 3 NOT DETECTED NOT DETECTED Final   Parainfluenza Virus 4 NOT DETECTED NOT DETECTED Final   Respiratory Syncytial Virus NOT DETECTED NOT DETECTED Final   Bordetella pertussis NOT DETECTED NOT DETECTED Final   Bordetella Parapertussis NOT DETECTED NOT DETECTED Final   Chlamydophila pneumoniae NOT DETECTED NOT DETECTED Final   Mycoplasma pneumoniae NOT DETECTED NOT DETECTED Final    Comment: Performed at [facility], [place] [address] [place], [place] [address]  Expectorated Sputum Assessment w Gram Stain, Rflx to Resp Cult     Status: None   Collection Time: 03/10/21 11:10 AM   Specimen: Expectorated Sputum  Result Value Ref Range Status   Specimen Description EXPECTORATED SPUTUM  Final   Special Requests NONE  Final   Sputum evaluation   Final    THIS SPECIMEN IS ACCEPTABLE FOR SPUTUM CULTURE Performed at [facility], [address] [place], [place] [address]    Report Status 03/10/2021 FINAL  Final  Culture, Respiratory w Gram Stain     Status: None   Collection Time: 03/10/21 11:10 AM  Result Value Ref Range Status   Specimen Description   Final    EXPECTORATED SPUTUM Performed at [facility], [address] [place], [place] [address]    Special Requests   Final    NONE Reflexed from [id number] Performed at [facility], [address] [place], [place] [address]    Gram Stain   Final    FEW WBC PRESENT, PREDOMINANTLY PMN RARE GRAM NEGATIVE COCCOBACILLI RARE GRAM POSITIVE COCCI IN CHAINS    Culture   Final    RARE Normal respiratory flora-no Staph aureus or Pseudomonas seen Performed at [facility], [place] [address] [place], [place] [address]    Report Status 03/13/2021 FINAL  Final     Labs: BNP (last 3 results) Recent Labs    03/07/21 1034 03/11/21 1419 03/12/21 0849  BNP 49.6 44.5 38.7   Basic Metabolic Panel: Recent Labs  Lab 03/07/21 1034 03/09/21 0553 03/11/21 1419 03/13/21 0442  NA 135 132* 131* 135  K 4.7 4.6 4.2 4.2  CL 107 103 102 103  CO2 20* 20* 20* 25  GLUCOSE 209* 351* 261* 199*  BUN 19 32* 38* 35*  CREATININE 1.50* 1.50* 1.57* 1.48*  CALCIUM 8.9 8.4* 8.4* 8.6*   Liver Function Tests: No results for input(s): AST, ALT, ALKPHOS, BILITOT, PROT, ALBUMIN in the last 168 hours. No results for input(s): LIPASE, AMYLASE in the last 168 hours. No results for input(s): AMMONIA in the last 168 hours. CBC: Recent Labs  Lab 03/07/21 1034 03/09/21 0553 03/11/21 1419  WBC 12.0* 14.5* 9.2  NEUTROABS  --  13.0*  --   HGB 16.4 13.6 15.4  HCT 49.4 41.9 47.4  MCV 94.3 93.5 93.1  PLT 168 186 208   Cardiac Enzymes: No results for input(s): CKTOTAL, CKMB, CKMBINDEX, TROPONINI in the last 168 hours. BNP: Invalid input(s): POCBNP CBG: Recent Labs  Lab 03/12/21 1151 03/12/21 1532 03/12/21 2140 03/13/21 0834 03/13/21 1215  GLUCAP 124* 288* 261* 170* 167*   D-Dimer No results for input(s): DDIMER in the last 72 hours. Hgb A1c No results for input(s): HGBA1C in the last 72 hours. Lipid Profile No results for input(s): CHOL, HDL, LDLCALC, TRIG, CHOLHDL, LDLDIRECT in the last 72 hours. Thyroid function studies No results for input(s): TSH, T4TOTAL, T3FREE, THYROIDAB in the last 72 hours.  Invalid input(s): FREET3 Anemia work up No results for input(s): VITAMINB12, FOLATE,  FERRITIN, TIBC, IRON, RETICCTPCT in the last 72 hours. Urinalysis    Component Value Date/Time   COLORURINE YELLOW (A) 03/07/2021 1533   APPEARANCEUR CLEAR (A) 03/07/2021 1533   LABSPEC 1.024 03/07/2021 1533   PHURINE 5.0 03/07/2021 1533   GLUCOSEU NEGATIVE 03/07/2021 1533   HGBUR NEGATIVE 03/07/2021 1533   BILIRUBINUR NEGATIVE 03/07/2021 1533   BILIRUBINUR neg 12/26/2014 1228   KETONESUR NEGATIVE 03/07/2021 1533   PROTEINUR NEGATIVE 03/07/2021 1533   UROBILINOGEN 0.2 12/26/2014 1228   NITRITE NEGATIVE 03/07/2021 1533   LEUKOCYTESUR NEGATIVE 03/07/2021 1533   Sepsis Labs Invalid input(s): PROCALCITONIN,  WBC,  LACTICIDVEN Microbiology Recent Results (from the past 240 hour(s))  Resp Panel by RT-PCR (Flu A&B, Covid) Nasopharyngeal Swab     Status: None   Collection Time: 03/07/21  3:33 PM   Specimen: Nasopharyngeal Swab; Nasopharyngeal(NP) swabs in vial transport medium  Result Value Ref Range Status   SARS Coronavirus 2 by RT PCR NEGATIVE NEGATIVE Final    Comment: (NOTE) SARS-CoV-2 target nucleic acids are NOT DETECTED.  The SARS-CoV-2 RNA is generally detectable in upper respiratory specimens during the acute phase of infection. The lowest concentration of SARS-CoV-2 viral copies this assay can detect is 138 copies/mL. A negative result does not preclude SARS-Cov-2 infection and should not be used as the sole basis for treatment or other patient management decisions. A negative result may occur with  improper specimen collection/handling, submission of specimen other than nasopharyngeal swab, presence of viral mutation(s) within the areas targeted by this assay, and inadequate number of viral copies(<138 copies/mL). A negative result must be combined with clinical observations, patient history, and epidemiological information. The expected result is Negative.  Fact Sheet for Patients:  EntrepreneurPulse.com.au  Fact Sheet for Healthcare Providers:   IncredibleEmployment.be  This test is no t yet approved or cleared by the [place] FDA and  has been authorized for detection and/or diagnosis of SARS-CoV-2 by FDA under an Emergency Use Authorization (EUA). This EUA will remain  in effect (meaning this test can be used) for the duration of the COVID-19 declaration under Section 564(b)(1) of the Act, 21 U.S.C.section 360bbb-3(b)(1), unless the authorization is terminated  or revoked sooner.       Influenza A by PCR NEGATIVE NEGATIVE Final   Influenza B by PCR NEGATIVE NEGATIVE Final    Comment: (NOTE) The Xpert Xpress SARS-CoV-2/FLU/RSV plus assay is intended as an aid in the diagnosis of influenza from Nasopharyngeal swab specimens and should not be used as a sole basis for treatment. Nasal washings and aspirates are unacceptable for Xpert Xpress SARS-CoV-2/FLU/RSV testing.  Fact Sheet for Patients: EntrepreneurPulse.com.au  Fact Sheet for Healthcare Providers: IncredibleEmployment.be  This test is not yet approved or cleared by the [place] FDA and has been authorized for detection and/or diagnosis of SARS-CoV-2 by FDA under an Emergency Use Authorization (EUA). This EUA will remain in effect (meaning this test can be used) for the duration of the COVID-19 declaration under  Section 564(b)(1) of the Act, 21 U.S.C. section 360bbb-3(b)(1), unless the authorization is terminated or revoked.  Performed at [facility], [address] [place], [place] [address]   Blood Culture (routine x 2)     Status: None   Collection Time: 03/07/21  3:33 PM   Specimen: BLOOD  Result Value Ref Range Status   Specimen Description BLOOD LEFT HAND  Final   Special Requests   Final    BOTTLES DRAWN AEROBIC AND ANAEROBIC Blood Culture adequate volume   Culture   Final    NO GROWTH 5 DAYS Performed at [facility], [address] [place], [place] [address]     Report Status 03/12/2021 FINAL  Final  Blood Culture (routine x 2)     Status: None   Collection Time: 03/07/21  3:33 PM   Specimen: BLOOD  Result Value Ref Range Status   Specimen Description BLOOD RIGHT HAND  Final   Special Requests   Final    BOTTLES DRAWN AEROBIC AND ANAEROBIC Blood Culture results may not be optimal due to an inadequate volume of blood received in culture bottles   Culture   Final    NO GROWTH 5 DAYS Performed at [facility], [address] [place], [place] [address]    Report Status 03/12/2021 FINAL  Final  Respiratory (~20 pathogens) panel by PCR     Status: None   Collection Time: 03/07/21  3:33 PM   Specimen: Nasopharyngeal Swab; Respiratory  Result Value Ref Range Status   Adenovirus NOT DETECTED NOT DETECTED Final   Coronavirus 229E NOT DETECTED NOT DETECTED Final    Comment: (NOTE) The Coronavirus on the Respiratory Panel, DOES NOT test for the novel  Coronavirus (2019 nCoV)    Coronavirus HKU1 NOT DETECTED NOT DETECTED Final   Coronavirus NL63 NOT DETECTED NOT DETECTED Final   Coronavirus OC43 NOT DETECTED NOT DETECTED Final   Metapneumovirus NOT DETECTED NOT DETECTED Final   Rhinovirus / Enterovirus NOT DETECTED NOT DETECTED Final   Influenza A NOT DETECTED NOT DETECTED Final   Influenza B NOT DETECTED NOT DETECTED Final   Parainfluenza Virus 1 NOT DETECTED NOT DETECTED Final   Parainfluenza Virus 2 NOT DETECTED NOT DETECTED Final   Parainfluenza Virus 3 NOT DETECTED NOT DETECTED Final   Parainfluenza Virus 4 NOT DETECTED NOT DETECTED Final   Respiratory Syncytial Virus NOT DETECTED NOT DETECTED Final   Bordetella pertussis NOT DETECTED NOT DETECTED Final   Bordetella Parapertussis NOT DETECTED NOT DETECTED Final   Chlamydophila pneumoniae NOT DETECTED NOT DETECTED Final   Mycoplasma pneumoniae NOT DETECTED NOT DETECTED Final    Comment: Performed at [facility], [place] [address], [place], [place] [address]  Expectorated  Sputum Assessment w Gram Stain, Rflx to Resp Cult     Status: None   Collection Time: 03/10/21 11:10 AM   Specimen: Expectorated Sputum  Result Value Ref Range Status   Specimen Description EXPECTORATED SPUTUM  Final   Special Requests NONE  Final   Sputum evaluation   Final    THIS SPECIMEN IS ACCEPTABLE FOR SPUTUM CULTURE Performed at [facility], [address], [place], [place] [address]    Report Status 03/10/2021 FINAL  Final  Culture, Respiratory w Gram Stain     Status: None   Collection Time: 03/10/21 11:10 AM  Result Value Ref Range Status   Specimen Description   Final    EXPECTORATED SPUTUM Performed at [facility], [place]  [address], [place], [place] [address]    Special Requests   Final    NONE Reflexed from [id number] Performed at [facility], [address] [place], [place] [address]    Gram Stain   Final    FEW WBC PRESENT, PREDOMINANTLY PMN RARE GRAM NEGATIVE COCCOBACILLI RARE GRAM POSITIVE COCCI IN CHAINS    Culture   Final    RARE Normal respiratory flora-no Staph aureus or Pseudomonas seen Performed at [facility], [place] [address] [place],  [address]    Report Status 03/13/2021 FINAL  Final     Time coordinating discharge: Over 30 minutes  SIGNED:   Nolberto Hanlon, MD  [facility] 03/13/2021, 12:18 PM Pager   If 7PM-7AM, please contact night-coverage [email or web address] Password [email or web address]

## 2021-03-13 NOTE — TOC Transition Note (Signed)
Transition of Care [facility]) - CM/SW Discharge Note   Patient Details  Name: CLARENCE COGSWELL MRN: [id number] Date of Birth: 11-Aug-1940  Transition of [facility]) CM/SW Contact:  Izola Price, RN Phone Number: 03/13/2021, 1:09 PM   Clinical Narrative:  Patient being discharged today. DME oxygen set up with [employer] and delivery to room confirmed. Declines HH services. Was seen by Diabetic Coordinator and has follow up appointments. Simmie Davies RN CM     Final next level of care: Home/Self Care Barriers to Discharge: Barriers Resolved   Patient Goals and CMS Choice Patient states their goals for this hospitalization and ongoing recovery are:: Patient wants to return home at discharge and he agrees to home oxygen CMS Medicare.gov Compare Post Acute Care list provided to:: Patient Choice offered to / list presented to : Patient  Discharge Placement                       Discharge Plan and Services   Discharge Planning Services: CM Consult Post Acute Care Choice: Durable Medical Equipment          DME Arranged: Oxygen (Confirmed delivery to patient's room) DME Agency: [employer] Date DME Agency Contacted: 03/12/21 Time DME Agency Contacted: [phone] Representative spoke with at DME Agency: [place]: Refused [place] Agency: NA        Social Determinants of Health ([place]) Interventions     Readmission Risk Interventions No flowsheet data found.

## 2021-03-13 NOTE — TOC Progression Note (Signed)
Transition of Care [facility]) - Progression Note    Patient Details  Name: Levi [name] MRN: [id number] Date of Birth: February 10, 1941  Transition of Care [facility]) CM/SW Contact  Izola Price, RN Phone Number: 03/13/2021, 1:06 PM  Clinical Narrative: DME Oxygen was delivered to room per Unit RN. Simmie Davies RN CM       Expected Discharge Plan: Home/Self Care Barriers to Discharge: Continued Medical Work up  Expected Discharge Plan and Services Expected Discharge Plan: Home/Self Care   Discharge Planning Services: CM Consult Post Acute Care Choice: Durable Medical Equipment Living arrangements for the past 2 months: Single Family Home Expected Discharge Date: 03/13/21               DME Arranged: Oxygen DME Agency: [employer] Date DME Agency Contacted: 03/12/21 Time DME Agency Contacted: [phone] Representative spoke with at DME Agency: [place] Arranged: Refused [place] Agency: NA         Social Determinants of Health ([place]) Interventions    Readmission Risk Interventions No flowsheet data found.

## 2021-07-19 ENCOUNTER — Telehealth: Payer: Self-pay | Admitting: *Deleted

## 2021-07-19 ENCOUNTER — Ambulatory Visit: Admission: RE | Admit: 2021-07-19 | Payer: No Typology Code available for payment source | Source: Ambulatory Visit

## 2021-07-19 NOTE — Telephone Encounter (Signed)
Patient called to report that he had missed his CT scan today because he was unaware of appointment. CT needs to be rescheduled and follow up with Dr. Grayland Ormond and Dr. Baruch Gouty. ?

## 2021-07-20 ENCOUNTER — Inpatient Hospital Stay: Payer: No Typology Code available for payment source | Admitting: Nurse Practitioner

## 2021-07-20 ENCOUNTER — Encounter: Payer: Self-pay | Admitting: Oncology

## 2021-07-21 ENCOUNTER — Ambulatory Visit: Payer: No Typology Code available for payment source | Admitting: Radiation Oncology

## 2021-07-30 ENCOUNTER — Ambulatory Visit
Admission: RE | Admit: 2021-07-30 | Discharge: 2021-07-30 | Disposition: A | Discharge status: Home / Self Care | Payer: No Typology Code available for payment source | Source: Ambulatory Visit | Attending: Oncology | Admitting: Oncology

## 2021-07-30 DIAGNOSIS — C3492 Malignant neoplasm of unspecified part of left bronchus or lung: Secondary | ICD-10-CM | POA: Diagnosis present

## 2021-07-30 LAB — POCT I-STAT CREATININE: Creatinine, Ser: 1.7 mg/dL — ABNORMAL HIGH (ref 0.61–1.24)

## 2021-07-30 MED ORDER — IOHEXOL 300 MG/ML  SOLN
60.0000 mL | Freq: Once | INTRAMUSCULAR | Status: AC | PRN
  Administered 2021-07-30: 60 mL via INTRAVENOUS

## 2021-07-30 MED ORDER — IOHEXOL 300 MG/ML  SOLN: 75.0000 mL | Freq: Once | INTRAMUSCULAR | Status: DC | PRN

## 2021-08-04 ENCOUNTER — Ambulatory Visit
Admission: RE | Admit: 2021-08-04 | Discharge: 2021-08-04 | Disposition: A | Discharge status: Home / Self Care | Payer: No Typology Code available for payment source | Source: Ambulatory Visit | Attending: Radiation Oncology | Admitting: Radiation Oncology

## 2021-08-04 ENCOUNTER — Inpatient Hospital Stay: Payer: No Typology Code available for payment source | Attending: Oncology | Admitting: Nurse Practitioner

## 2021-08-04 ENCOUNTER — Encounter: Payer: Self-pay | Admitting: Nurse Practitioner

## 2021-08-04 VITALS — BP 143/84 | HR 61 | Temp 97.5°F | Resp 20 | Wt 283.8 lb

## 2021-08-04 DIAGNOSIS — Z87891 Personal history of nicotine dependence: Secondary | ICD-10-CM | POA: Insufficient documentation

## 2021-08-04 DIAGNOSIS — E039 Hypothyroidism, unspecified: Secondary | ICD-10-CM | POA: Insufficient documentation

## 2021-08-04 DIAGNOSIS — C3412 Malignant neoplasm of upper lobe, left bronchus or lung: Secondary | ICD-10-CM

## 2021-08-04 DIAGNOSIS — I4891 Unspecified atrial fibrillation: Secondary | ICD-10-CM | POA: Diagnosis not present

## 2021-08-04 DIAGNOSIS — N289 Disorder of kidney and ureter, unspecified: Secondary | ICD-10-CM | POA: Insufficient documentation

## 2021-08-04 DIAGNOSIS — F419 Anxiety disorder, unspecified: Secondary | ICD-10-CM | POA: Diagnosis not present

## 2021-08-04 DIAGNOSIS — F32A Depression, unspecified: Secondary | ICD-10-CM | POA: Insufficient documentation

## 2021-08-04 DIAGNOSIS — Z85118 Personal history of other malignant neoplasm of bronchus and lung: Secondary | ICD-10-CM | POA: Insufficient documentation

## 2021-08-04 DIAGNOSIS — Z08 Encounter for follow-up examination after completed treatment for malignant neoplasm: Secondary | ICD-10-CM

## 2021-08-04 DIAGNOSIS — F17210 Nicotine dependence, cigarettes, uncomplicated: Secondary | ICD-10-CM | POA: Insufficient documentation

## 2021-08-04 DIAGNOSIS — Z85820 Personal history of malignant melanoma of skin: Secondary | ICD-10-CM | POA: Insufficient documentation

## 2021-08-04 DIAGNOSIS — Z9221 Personal history of antineoplastic chemotherapy: Secondary | ICD-10-CM | POA: Insufficient documentation

## 2021-08-04 DIAGNOSIS — E669 Obesity, unspecified: Secondary | ICD-10-CM | POA: Diagnosis not present

## 2021-08-04 DIAGNOSIS — Z79899 Other long term (current) drug therapy: Secondary | ICD-10-CM | POA: Diagnosis not present

## 2021-08-04 DIAGNOSIS — J432 Centrilobular emphysema: Secondary | ICD-10-CM | POA: Diagnosis not present

## 2021-08-04 DIAGNOSIS — Z923 Personal history of irradiation: Secondary | ICD-10-CM | POA: Diagnosis not present

## 2021-08-04 DIAGNOSIS — C3492 Malignant neoplasm of unspecified part of left bronchus or lung: Secondary | ICD-10-CM | POA: Diagnosis not present

## 2021-08-04 NOTE — Progress Notes (Signed)
Radiation Oncology ?Follow up Note ? ?Name: Levi [name]   ?Date:   08/04/2021 ?MRN:  [id number] ?DOB: Jul 12, 1940  ? ? ?This 81 y.o. 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 ? ?REFERRING PROVIDER: Eliezer Lofts, FNP ? 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  He is also put on significant weight in the past several months secondary to his sedentary lifestyle..  CT scan of the chest last month showed posttreatment radiation fibrosis and consolidate consolidation suprahilar left upper lobe no evidence of malignant recurrence or metastatic disease in the chest.  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 ?COMPLICATIONS OF TREATMENT: none ? ?FOLLOW UP COMPLIANCE: keeps appointments  ? ?PHYSICAL EXAM:  ?There were no vitals taken for this visit. ?Morbidly obese male in NAD.  Well-developed well-nourished patient in NAD. HEENT reveals PERLA, EOMI, discs not visualized.  Oral cavity is clear. No oral mucosal lesions are identified. Neck is clear without evidence of cervical or supraclavicular adenopathy. Lungs are clear to A&P. Cardiac examination is essentially unremarkable with regular rate and rhythm without murmur rub or thrill. Abdomen is benign with no organomegaly or masses noted. Motor sensory and DTR levels are equal and symmetric in the upper and lower extremities. Cranial nerves II through XII are grossly intact. Proprioception is intact. No peripheral adenopathy or edema is identified. No motor or sensory levels are noted. Crude visual fields are within normal range. ? ?RADIOLOGY  RESULTS: CT scans reviewed compatible with above-stated findings ? ?PLAN: Present time patient is doing well from a pulmonary standpoint with no evidence of disease.  Will be undergoing cardioversion in the near future for A-fib.  I have asked to see him back in 1 year for follow-up and then will discontinue follow-up care.  Patient wife both know to call with any concerns at any time. ? ?I would like to take this opportunity to thank you for allowing me to participate in the care of your patient.. ?  ? Noreene Filbert, MD ? ?

## 2021-08-04 NOTE — Progress Notes (Signed)
Patient newly diagnosed with chronic A-fib and is scheduled for cardioversion at [facility].   ?

## 2021-08-04 NOTE — Progress Notes (Signed)
?[place]  ?[phone] [phone] ? ?ID: [name] OB: [birth year]  MR#: [id number]  TFT#:[id number] ? ?Patient Care Team: ?[place] as PCP - General (General Practice) ?Kate Sable, MD as PCP - Cardiology (Cardiology) ?Telford Nab, RN as Equities trader ?Lloyd Huger, MD as Consulting Physician (Oncology) ?Noreene Filbert, MD as Referring Physician (Radiation Oncology) ?Nestor Lewandowsky, MD (Inactive) as Referring Physician (Cardiothoracic Surgery) ? ?CHIEF COMPLAINT: Clinical stage IIB squamous cell carcinoma, left upper lobe lung. ? ?INTERVAL HISTORY: Patient returns to clinic today for routine 18-month evaluation and discussion of his imaging results.  He continues to be anxious. Has gained 12 lbs  since September 2022. Patient reports more like 50 lbs. Has been diagnosed with a fib and [facility]. Diabetes uncontrolled. Likes kool-aid and high carbohydrate foods. Has ongoing shortness of breath with exertion. No chest pain. No dizziness, falls. No fevers or illness. No cough or hemoptysis. Denies nausea, vomiting, constipation, or diarrhea. Accompanied by wife who contributes to history.  ? ?REVIEW OF SYSTEMS:   ?Review of Systems  ?Constitutional: Negative.  Negative for fever, malaise/fatigue and weight loss.  ?HENT: Negative.  Negative for congestion.   ?Respiratory: Negative.  Negative for cough, hemoptysis and shortness of breath.   ?Cardiovascular: Negative.  Negative for chest pain and leg swelling.  ?Gastrointestinal: Negative.  Negative for abdominal pain and nausea.  ?Genitourinary: Negative.  Negative for dysuria.  ?Musculoskeletal: Negative.  Negative for back pain.  ?Skin:  Negative for rash.  ?Neurological: Negative.  Negative for dizziness, focal weakness, weakness and headaches.  ?Endo/Heme/Allergies:  Does not bruise/bleed easily.  ?Psychiatric/Behavioral:  Negative for depression. The patient is  nervous/anxious.   ? ?As per HPI. Otherwise, a complete review of systems is negative. ? ?PAST MEDICAL HISTORY: ?Past Medical History:  ?Diagnosis Date  ? Asthma   ? COPD (chronic obstructive pulmonary disease) ([place])   ? Hearing loss   ? Hypertension   ? Hypothyroidism   ? Mass of left lung   ? Melanoma in situ of face [facility])   ? Prostate enlargement   ? Shortness of breath   ? Squamous cell carcinoma of lung, left ([place])   ? Thyroid disease   ? Tobacco abuse   ? ? ?PAST SURGICAL HISTORY: ?Past Surgical History:  ?Procedure Laterality Date  ? ELECTROMAGNETIC NAVIGATION BROCHOSCOPY Left 10/19/2018  ? Procedure: ELECTROMAGNETIC NAVIGATION BRONCHOSCOPY LEFT;  Surgeon: Tyler Pita, MD;  Location: [facility] ORS;  Service: Cardiopulmonary;  Laterality: Left;  ? ENDOBRONCHIAL ULTRASOUND Left 10/19/2018  ? Procedure: ENDOBRONCHIAL ULTRASOUND LEFT;  Surgeon: Tyler Pita, MD;  Location: [facility] ORS;  Service: Cardiopulmonary;  Laterality: Left;  ? MELANOMA EXCISION Left   ? NO PAST SURGERIES    ? ? ?FAMILY HISTORY: ?Family History  ?Problem Relation Age of Onset  ? Aneurysm Mother   ? Cancer Father   ? ? ?ADVANCED DIRECTIVES (Y/N):  N ? ?HEALTH MAINTENANCE: ?Social History  ? ?Tobacco Use  ? Smoking status: Former  ?  Packs/day: 0.25  ?  Years: 62.00  ?  Pack years: 15.50  ?  Types: Cigarettes  ?  Quit date: 09/05/2018  ?  Years since quitting: 2.9  ? Smokeless tobacco: Never  ?Vaping Use  ? Vaping Use: Never used  ?Substance Use Topics  ? Alcohol use: No  ?  Alcohol/week: 0.0 standard drinks  ? Drug use: No  ? ? ? Colonoscopy: ? PAP: ? Bone density: ? Lipid  panel: ? ?No Known Allergies ? ?Current Outpatient Medications  ?Medication Sig Dispense Refill  ? albuterol (PROVENTIL HFA;VENTOLIN HFA) 108 (90 Base) MCG/ACT inhaler Inhale 2 puffs into the lungs every 6 (six) hours as needed for wheezing or shortness of breath. 1 Inhaler 0  ? apixaban (ELIQUIS) 5 MG TABS tablet Take 1 tablet (5 mg total) by mouth 2 (two) times daily.  180 tablet 3  ? budesonide-formoterol (SYMBICORT) 160-4.5 MCG/ACT inhaler Inhale 2 puffs into the lungs 2 (two) times daily.    ? diltiazem (CARDIZEM SR) 90 MG 12 hr capsule Take 1 capsule (90 mg total) by mouth daily. 30 capsule 0  ? finasteride (PROSCAR) 5 MG tablet Take 5 mg by mouth daily.    ? glimepiride (AMARYL) 2 MG tablet Take 1 tablet (2 mg total) by mouth every morning. 30 tablet 0  ? levothyroxine (SYNTHROID) 137 MCG tablet Take 137 mcg by mouth daily before breakfast.    ? tamsulosin (FLOMAX) 0.4 MG CAPS capsule TAKE ONE CAPSULE BY MOUTH DAILY FOR PROSTATE    ? vitamin B-12 (CYANOCOBALAMIN) 1000 MCG tablet Take 1,000 mcg by mouth daily.    ? ALPRAZolam (XANAX) 0.5 MG tablet Take 0.5 mg by mouth at bedtime as needed for anxiety. (Patient not taking: Reported on 08/04/2021)    ? pantoprazole (PROTONIX) 20 MG tablet Take 1 tablet (20 mg total) by mouth daily for 14 days. (Patient not taking: Reported on 08/04/2021) 14 tablet 0  ? Vitamin D, Ergocalciferol, (DRISDOL) 1.25 MG (50000 UT) CAPS capsule Take 50,000 Units by mouth every 7 (seven) days. (Patient not taking: Reported on 08/04/2021)    ? ?No current facility-administered medications for this visit.  ? ? ?OBJECTIVE: ?Vitals:  ? 08/04/21 1100  ?BP: (!) 143/84  ?Pulse: 61  ?Resp: 20  ?Temp: (!) 97.5 ?F (36.4 ?C)  ?SpO2: 94%  ?   Body mass index is 39.58 kg/m?Marland Kitchen    ECOG FS:0 - Asymptomatic ? ?General: Well-developed, well-nourished, no acute distress. Obese. Accompanied.  ?Eyes: Pink conjunctiva, anicteric sclera. ?Lungs: decreased. No audible wheezing.  ?Heart: Regular rate and rhythm.  ?Abdomen: Soft, nontender, nondistended.  ?Musculoskeletal: No edema, cyanosis, or clubbing. ?Neuro: Alert, answering all questions appropriately.  ?Skin: No rashes or petechiae noted. ?Psych: anxious affect.  ? ?LAB RESULTS: ? ?Lab Results  ?Component Value Date  ? NA 135 03/13/2021  ? K 4.2 03/13/2021  ? CL 103 03/13/2021  ? CO2 25 03/13/2021  ? GLUCOSE 199 (H) 03/13/2021  ?  BUN 35 (H) 03/13/2021  ? CREATININE 1.70 (H) 07/30/2021  ? CALCIUM 8.6 (L) 03/13/2021  ? PROT 6.4 (L) 07/18/2019  ? ALBUMIN 3.7 07/18/2019  ? AST 29 07/18/2019  ? ALT 35 07/18/2019  ? ALKPHOS 51 07/18/2019  ? BILITOT 0.8 07/18/2019  ? GFRNONAA 48 (L) 03/13/2021  ? GFRAA 58 (L) 07/18/2019  ? ? ?Lab Results  ?Component Value Date  ? WBC 9.2 03/11/2021  ? NEUTROABS 13.0 (H) 03/09/2021  ? HGB 15.4 03/11/2021  ? HCT 47.4 03/11/2021  ? MCV 93.1 03/11/2021  ? PLT 208 03/11/2021  ? ? ?STUDIES: ?CT CHEST W CONTRAST ? ?Result Date: 08/01/2021 ?CLINICAL DATA:  Left upper lobe squamous cell lung cancer restaging, status post chemotherapy and radiation * Tracking Code: BO * EXAM: CT CHEST WITH CONTRAST TECHNIQUE: Multidetector CT imaging of the chest was performed during intravenous contrast administration. RADIATION DOSE REDUCTION: This exam was performed according to the departmental dose-optimization program which includes automated exposure control, adjustment of the  mA and/or kV according to patient size and/or use of iterative reconstruction technique. CONTRAST:  27mL OMNIPAQUE IOHEXOL 300 MG/ML  SOLN COMPARISON:  03/09/2021 FINDINGS: Cardiovascular: Aortic atherosclerosis. Normal heart size. Left and right coronary artery calcifications. No pericardial effusion. Mediastinum/Nodes: No enlarged mediastinal, hilar, or axillary lymph nodes. Saber sheath trachea (series 3, image 45). Thyroid gland and esophagus demonstrate no significant findings. Lungs/Pleura: Severe centrilobular and paraseptal emphysema. Diffuse bilateral bronchial wall thickening. Unchanged, somewhat bandlike fibrosis and consolidation of the suprahilar left upper lobe (series 3, image 47). A previously queried 0.7 cm nodule in the central left apex is not clearly appreciated (series 3, image 32). No pleural effusion or pneumothorax. Upper Abdomen: No acute abnormality. Somewhat coarse, nodular contour of the liver. Musculoskeletal: No chest wall  abnormality. No suspicious osseous lesions identified. IMPRESSION: 1. Unchanged, post treatment/post radiation fibrosis and consolidation of the suprahilar left upper lobe. No evidence of malignant recurrence or

## 2021-11-02 NOTE — Progress Notes (Unsigned)
Electrophysiology Office Note:    Date:  11/03/2021   ID:  Levi [name], DOB Jun 27, 1940, MRN [id number]  PCP:  [place] Cardiologist:  Kate Sable, MD  [facility] Electrophysiologist:  Vickie Epley, MD   Referring MD: Andrez Grime, PA-C   Chief Complaint: Atrial fibrillation  History of Present Illness:    Levi [name] is a 81 y.o. male who presents for an evaluation of atrial fibrillation at the request of Ileene Hutchinson, PA-C. Their medical history includes BPH, lung cancer post chemo and radiation, COPD, hypothyroidism, diabetes, CKD 3B.  He has previously worn a heart monitor which showed 100% burden of atrial fibrillation.  Prior echo in the [place] system showed a normal left ventricular function.  He last saw the [facility] cardiologists November 01, 2021.  His diagnosis of atrial fibrillation dates back to 2020 when he passed out at work.  He had pneumonia in 2022 requiring hospitalization and home oxygen therapy.  He gets quite short of breath and requires intermittent oxygen. He has had a pulmonary stress test which showed atrial fibrillation with rates fluctuating between the 30s and "high rates".  He tells me that he has gained 60 pounds recently.  He is constantly short of breath.  With any exertion he uses supplemental oxygen.      Past Medical History:  Diagnosis Date   Asthma    COPD (chronic obstructive pulmonary disease) ([place])    Hearing loss    Hypertension    Hypothyroidism    Mass of left lung    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[place])    Prostate enlargement    Shortness of breath    Squamous cell carcinoma of lung, left ([place])    Thyroid disease    Tobacco abuse     Past Surgical History:  Procedure Laterality Date   ELECTROMAGNETIC NAVIGATION BROCHOSCOPY Left 10/19/2018   Procedure: ELECTROMAGNETIC NAVIGATION BRONCHOSCOPY LEFT;  Surgeon: Tyler Pita, MD;  Location: [facility] ORS;  Service: Cardiopulmonary;  Laterality:  Left;   ENDOBRONCHIAL ULTRASOUND Left 10/19/2018   Procedure: ENDOBRONCHIAL ULTRASOUND LEFT;  Surgeon: Tyler Pita, MD;  Location: [facility] ORS;  Service: Cardiopulmonary;  Laterality: Left;   MELANOMA EXCISION Left    NO PAST SURGERIES      Current Medications: Current Meds  Medication Sig   albuterol (PROVENTIL HFA;VENTOLIN HFA) 108 (90 Base) MCG/ACT inhaler Inhale 2 puffs into the lungs every 6 (six) hours as needed for wheezing or shortness of breath.   apixaban (ELIQUIS) 5 MG TABS tablet Take 1 tablet (5 mg total) by mouth 2 (two) times daily.   carboxymethylcellulose (REFRESH PLUS) 0.5 % SOLN 1 drop 4 (four) times daily as needed.   diltiazem (CARDIZEM SR) 90 MG 12 hr capsule Take 1 capsule (90 mg total) by mouth daily. (Patient taking differently: Take 30 mg by mouth in the morning, at noon, and at bedtime.)   finasteride (PROSCAR) 5 MG tablet Take 5 mg by mouth daily.   fluticasone-salmeterol (ADVAIR) 250-50 MCG/ACT AEPB Inhale 1 puff into the lungs in the morning and at bedtime.   glimepiride (AMARYL) 2 MG tablet Take 1 tablet (2 mg total) by mouth every morning.   levothyroxine (SYNTHROID) 137 MCG tablet Take 200 mcg by mouth daily before breakfast.   SEMAGLUTIDE,0.25 OR 0.5MG /DOS, [place] Inject 0.25 mg into the skin once a week.   tamsulosin (FLOMAX) 0.4 MG CAPS capsule TAKE ONE CAPSULE BY MOUTH DAILY FOR PROSTATE  vitamin B-12 (CYANOCOBALAMIN) 1000 MCG tablet Take 1,000 mcg by mouth daily.     Allergies:   Patient has no known allergies.   Social History   Socioeconomic History   Marital status: Married    Spouse name: [name]   Number of children: 5   Years of education: Not on file   Highest education level: Not on file  Occupational History   Not on file  Tobacco Use   Smoking status: Former    Packs/day: 0.25    Years: 62.00    Total pack years: 15.50    Types: Cigarettes    Quit date: 09/05/2018    Years since quitting: 3.1   Smokeless tobacco: Never  Vaping  Use   Vaping Use: Never used  Substance and Sexual Activity   Alcohol use: No    Alcohol/week: 0.0 standard drinks of alcohol   Drug use: No   Sexual activity: Not on file  Other Topics Concern   Not on file  Social History Narrative   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 He is married to his wife of 30+ years, [name] and has 5 children (1 deceased), many grand children, and one great grand child.    Social Determinants of Health   Financial Resource Strain: Low Risk  (12/04/2018)   Overall Financial Resource Strain (CARDIA)    Difficulty of Paying Living Expenses: Not very hard  Food Insecurity: No Food Insecurity (12/04/2018)   Hunger Vital Sign    Worried About Running Out of Food in the Last Year: Never true    Ran Out of Food in the Last Year: Never true  Transportation Needs: No Transportation Needs (12/04/2018)   PRAPARE - Hydrologist (Medical): No    Lack of Transportation (Non-Medical): No  Physical Activity: Not on file  Stress: Stress Concern Present (12/04/2018)   [place]    Feeling of Stress : Very much  Social Connections: Unknown (12/04/2018)   Social Connection and Isolation Panel [NHANES]    Frequency of Communication with Friends and Family: More than three times a week    Frequency of Social Gatherings with Friends and Family: Once a week    Attends Religious Services: Not on Advertising copywriter or Organizations: Not on file    Attends Archivist Meetings: Not on file    Marital Status: Not on file     Family History: The patient's family history includes Aneurysm in his mother; Cancer in his father.  ROS:   Please see the history of present illness.    All other systems reviewed  and are negative.  EKGs/Labs/Other Studies Reviewed:    The following studies were reviewed today:  April 2021 ZIO monitor from the [place] 1% A-fib burden, average rate 102 bpm Longest episode of A-fib lasting 65 minutes No high degree AV block or long pauses     Recent Labs: 03/11/2021: Hemoglobin 15.4; Platelets 208 03/12/2021: B Natriuretic Peptide 26.2 03/13/2021: BUN 35; Potassium 4.2; Sodium 135 07/30/2021: Creatinine, Ser 1.70  Recent Lipid Panel No results found for: "CHOL", "TRIG", "HDL", "CHOLHDL", "VLDL", "LDLCALC", "LDLDIRECT"  Physical Exam:    VS:  Ht 5\' 11"  (1.803 m)   Wt 277 lb 6.4 oz (125.8  kg)   BMI 38.69 kg/m     Wt Readings from Last 3 Encounters:  11/03/21 277 lb 6.4 oz (125.8 kg)  08/04/21 283 lb 12.8 oz (128.7 kg)  03/07/21 269 lb 6.4 oz (122.2 kg)     GEN: Obese.  Well nourished, well developed in no acute distress HEENT: Normal NECK: No JVD; No carotid bruits LYMPHATICS: No lymphadenopathy CARDIAC: Irregularly irregular, no murmurs, rubs, gallops RESPIRATORY:  Clear to auscultation without rales, wheezing or rhonchi  ABDOMEN: Soft, non-tender, non-distended MUSCULOSKELETAL:  No edema; No deformity  SKIN: Warm and dry NEUROLOGIC:  Alert and oriented x 3 PSYCHIATRIC:  Normal affect       ASSESSMENT:    1. Persistent atrial fibrillation ([place])   2. Chronic respiratory failure with hypoxia (HCC)   3. Morbid obesity ([place])   4. Coronary artery disease involving native coronary artery of native heart with angina pectoris ([place])    PLAN:    In order of problems listed above:  #Persistent atrial fibrillation On Eliquis for stroke prophylaxis.  Is not can be a candidate for sotalol or Tikosyn given his baseline kidney function (last creatinine 1.7).  Amiodarone is not a good option given his oxygen use and respiratory status.  I will start with a trial of Multaq.  He will take it 2 to 3 weeks and then we will do a cardioversion.  He has not missed  any doses of his Eliquis.  If he fails Multaq, he would be in permanent atrial fibrillation.  This was discussed with the patient during today's visit.  We discussed the link between obesity and success rates of antiarrhythmic drugs during today's clinic appointment. Cardioversion was discussed with the patient in detail during today's visit and he wishes to proceed.   #Chronic respiratory failure with hypoxia On supplemental oxygen at home with any exertion.  #Morbid obesity Likely contributing to his shortness of breath.  #Coronary artery disease No ischemic symptoms today.  Precludes safe use of class Ic antiarrhythmic drugs.  Follow-up 8 weeks after cardioversion.    Medication Adjustments/Labs and Tests Ordered: Current medicines are reviewed at length with the patient today.  Concerns regarding medicines are outlined above.  No orders of the defined types were placed in this encounter.  No orders of the defined types were placed in this encounter.    Signed, Hilton Cork. Quentin Ore, MD, [facility], [facility] 11/03/2021 10:46 AM    Electrophysiology  [facility]

## 2021-11-02 NOTE — H&P (View-Only) (Signed)
Electrophysiology Office Note:    Date:  11/03/2021   ID:  Levi [name], DOB 03-21-1941, MRN [id number]  PCP:  [place] Cardiologist:  Kate Sable, MD  [facility] Electrophysiologist:  Vickie Epley, MD   Referring MD: Andrez Grime, PA-C   Chief Complaint: Atrial fibrillation  History of Present Illness:    Levi [name] is a 81 y.o. male who presents for an evaluation of atrial fibrillation at the request of Ileene Hutchinson, PA-C. Their medical history includes BPH, lung cancer post chemo and radiation, COPD, hypothyroidism, diabetes, CKD 3B.  He has previously worn a heart monitor which showed 100% burden of atrial fibrillation.  Prior echo in the [place] system showed a normal left ventricular function.  He last saw the [facility] cardiologists November 01, 2021.  His diagnosis of atrial fibrillation dates back to 2020 when he passed out at work.  He had pneumonia in 2022 requiring hospitalization and home oxygen therapy.  He gets quite short of breath and requires intermittent oxygen. He has had a pulmonary stress test which showed atrial fibrillation with rates fluctuating between the 30s and "high rates".  He tells me that he has gained 60 pounds recently.  He is constantly short of breath.  With any exertion he uses supplemental oxygen.      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[place])    Prostate enlargement    Shortness of breath    Squamous cell carcinoma of lung, left ([place])    Thyroid disease    Tobacco abuse     Past Surgical History:  Procedure Laterality Date   ELECTROMAGNETIC NAVIGATION BROCHOSCOPY Left 10/19/2018   Procedure: ELECTROMAGNETIC NAVIGATION BRONCHOSCOPY LEFT;  Surgeon: Tyler Pita, MD;  Location: [facility] ORS;  Service: Cardiopulmonary;  Laterality:  Left;   ENDOBRONCHIAL ULTRASOUND Left 10/19/2018   Procedure: ENDOBRONCHIAL ULTRASOUND LEFT;  Surgeon: Tyler Pita, MD;  Location: [facility] ORS;  Service: Cardiopulmonary;  Laterality: Left;   MELANOMA EXCISION Left    NO PAST SURGERIES      Current Medications: Current Meds  Medication Sig   albuterol (PROVENTIL HFA;VENTOLIN HFA) 108 (90 Base) MCG/ACT inhaler Inhale 2 puffs into the lungs every 6 (six) hours as needed for wheezing or shortness of breath.   apixaban (ELIQUIS) 5 MG TABS tablet Take 1 tablet (5 mg total) by mouth 2 (two) times daily.   carboxymethylcellulose (REFRESH PLUS) 0.5 % SOLN 1 drop 4 (four) times daily as needed.   diltiazem (CARDIZEM SR) 90 MG 12 hr capsule Take 1 capsule (90 mg total) by mouth daily. (Patient taking differently: Take 30 mg by mouth in the morning, at noon, and at bedtime.)   finasteride (PROSCAR) 5 MG tablet Take 5 mg by mouth daily.   fluticasone-salmeterol (ADVAIR) 250-50 MCG/ACT AEPB Inhale 1 puff into the lungs in the morning and at bedtime.   glimepiride (AMARYL) 2 MG tablet Take 1 tablet (2 mg total) by mouth every morning.   levothyroxine (SYNTHROID) 137 MCG tablet Take 200 mcg by mouth daily before breakfast.   SEMAGLUTIDE,0.25 OR 0.5MG /DOS, [place] Inject 0.25 mg into the skin once a week.   tamsulosin (FLOMAX) 0.4 MG CAPS capsule TAKE ONE CAPSULE BY MOUTH DAILY FOR PROSTATE  vitamin B-12 (CYANOCOBALAMIN) 1000 MCG tablet Take 1,000 mcg by mouth daily.     Allergies:   Patient has no known allergies.   Social History   Socioeconomic History   Marital status: Married    Spouse name: [name]   Number of children: 5   Years of education: Not on file   Highest education level: Not on file  Occupational History   Not on file  Tobacco Use   Smoking status: Former    Packs/day: 0.25    Years: 62.00    Total pack years: 15.50    Types: Cigarettes    Quit date: 09/05/2018    Years since quitting: 3.1   Smokeless tobacco: Never  Vaping  Use   Vaping Use: Never used  Substance and Sexual Activity   Alcohol use: No    Alcohol/week: 0.0 standard drinks of alcohol   Drug use: No   Sexual activity: Not on file  Other Topics Concern   Not on file  Social History Narrative   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 He is married to his wife of 30+ years, [name] and has 5 children (1 deceased), many grand children, and one great grand child.    Social Determinants of Health   Financial Resource Strain: Low Risk  (12/04/2018)   Overall Financial Resource Strain (CARDIA)    Difficulty of Paying Living Expenses: Not very hard  Food Insecurity: No Food Insecurity (12/04/2018)   Hunger Vital Sign    Worried About Running Out of Food in the Last Year: Never true    Ran Out of Food in the Last Year: Never true  Transportation Needs: No Transportation Needs (12/04/2018)   PRAPARE - Hydrologist (Medical): No    Lack of Transportation (Non-Medical): No  Physical Activity: Not on file  Stress: Stress Concern Present (12/04/2018)   [place]    Feeling of Stress : Very much  Social Connections: Unknown (12/04/2018)   Social Connection and Isolation Panel [NHANES]    Frequency of Communication with Friends and Family: More than three times a week    Frequency of Social Gatherings with Friends and Family: Once a week    Attends Religious Services: Not on Advertising copywriter or Organizations: Not on file    Attends Archivist Meetings: Not on file    Marital Status: Not on file     Family History: The patient's family history includes Aneurysm in his mother; Cancer in his father.  ROS:   Please see the history of present illness.    All other systems reviewed  and are negative.  EKGs/Labs/Other Studies Reviewed:    The following studies were reviewed today:  April 2021 ZIO monitor from the [place] 1% A-fib burden, average rate 102 bpm Longest episode of A-fib lasting 65 minutes No high degree AV block or long pauses     Recent Labs: 03/11/2021: Hemoglobin 15.4; Platelets 208 03/12/2021: B Natriuretic Peptide 26.2 03/13/2021: BUN 35; Potassium 4.2; Sodium 135 07/30/2021: Creatinine, Ser 1.70  Recent Lipid Panel No results found for: "CHOL", "TRIG", "HDL", "CHOLHDL", "VLDL", "LDLCALC", "LDLDIRECT"  Physical Exam:    VS:  Ht 5\' 11"  (1.803 m)   Wt 277 lb 6.4 oz (125.8  kg)   BMI 38.69 kg/m     Wt Readings from Last 3 Encounters:  11/03/21 277 lb 6.4 oz (125.8 kg)  08/04/21 283 lb 12.8 oz (128.7 kg)  03/07/21 269 lb 6.4 oz (122.2 kg)     GEN: Obese.  Well nourished, well developed in no acute distress HEENT: Normal NECK: No JVD; No carotid bruits LYMPHATICS: No lymphadenopathy CARDIAC: Irregularly irregular, no murmurs, rubs, gallops RESPIRATORY:  Clear to auscultation without rales, wheezing or rhonchi  ABDOMEN: Soft, non-tender, non-distended MUSCULOSKELETAL:  No edema; No deformity  SKIN: Warm and dry NEUROLOGIC:  Alert and oriented x 3 PSYCHIATRIC:  Normal affect       ASSESSMENT:    1. Persistent atrial fibrillation ([place])   2. Chronic respiratory failure with hypoxia (HCC)   3. Morbid obesity ([place])   4. Coronary artery disease involving native coronary artery of native heart with angina pectoris ([place])    PLAN:    In order of problems listed above:  #Persistent atrial fibrillation On Eliquis for stroke prophylaxis.  Is not can be a candidate for sotalol or Tikosyn given his baseline kidney function (last creatinine 1.7).  Amiodarone is not a good option given his oxygen use and respiratory status.  I will start with a trial of Multaq.  He will take it 2 to 3 weeks and then we will do a cardioversion.  He has not missed  any doses of his Eliquis.  If he fails Multaq, he would be in permanent atrial fibrillation.  This was discussed with the patient during today's visit.  We discussed the link between obesity and success rates of antiarrhythmic drugs during today's clinic appointment. Cardioversion was discussed with the patient in detail during today's visit and he wishes to proceed.   #Chronic respiratory failure with hypoxia On supplemental oxygen at home with any exertion.  #Morbid obesity Likely contributing to his shortness of breath.  #Coronary artery disease No ischemic symptoms today.  Precludes safe use of class Ic antiarrhythmic drugs.  Follow-up 8 weeks after cardioversion.    Medication Adjustments/Labs and Tests Ordered: Current medicines are reviewed at length with the patient today.  Concerns regarding medicines are outlined above.  No orders of the defined types were placed in this encounter.  No orders of the defined types were placed in this encounter.    Signed, Hilton Cork. Quentin Ore, MD, [facility], [facility] 11/03/2021 10:46 AM    Electrophysiology [facility]

## 2021-11-03 ENCOUNTER — Ambulatory Visit (INDEPENDENT_AMBULATORY_CARE_PROVIDER_SITE_OTHER): Payer: No Typology Code available for payment source | Admitting: Cardiology

## 2021-11-03 ENCOUNTER — Other Ambulatory Visit
Admission: RE | Admit: 2021-11-03 | Discharge: 2021-11-03 | Disposition: A | Discharge status: Home / Self Care | Payer: No Typology Code available for payment source | Source: Ambulatory Visit | Attending: Cardiology | Admitting: Cardiology

## 2021-11-03 ENCOUNTER — Encounter: Payer: Self-pay | Admitting: Cardiology

## 2021-11-03 VITALS — Ht 71.0 in | Wt 277.4 lb

## 2021-11-03 DIAGNOSIS — I4819 Other persistent atrial fibrillation: Secondary | ICD-10-CM | POA: Diagnosis present

## 2021-11-03 DIAGNOSIS — I25119 Atherosclerotic heart disease of native coronary artery with unspecified angina pectoris: Secondary | ICD-10-CM | POA: Insufficient documentation

## 2021-11-03 DIAGNOSIS — J9611 Chronic respiratory failure with hypoxia: Secondary | ICD-10-CM | POA: Diagnosis present

## 2021-11-03 LAB — BASIC METABOLIC PANEL
Anion gap: 9 (ref 5–15)
BUN: 24 mg/dL — ABNORMAL HIGH (ref 8–23)
CO2: 19 mmol/L — ABNORMAL LOW (ref 22–32)
Calcium: 9 mg/dL (ref 8.9–10.3)
Chloride: 110 mmol/L (ref 98–111)
Creatinine, Ser: 1.63 mg/dL — ABNORMAL HIGH (ref 0.61–1.24)
GFR, Estimated: 43 mL/min — ABNORMAL LOW (ref >60)
Glucose, Bld: 84 mg/dL (ref 70–99)
Potassium: 4 mmol/L (ref 3.5–5.1)
Sodium: 138 mmol/L (ref 135–145)

## 2021-11-03 LAB — CBC
HCT: 53.8 % — ABNORMAL HIGH (ref 39.0–52.0)
Hemoglobin: 17.4 g/dL — ABNORMAL HIGH (ref 13.0–17.0)
MCH: 29.5 pg (ref 26.0–34.0)
MCHC: 32.3 g/dL (ref 30.0–36.0)
MCV: 91.3 fL (ref 80.0–100.0)
Platelets: 192 10*3/uL (ref 150–400)
RBC: 5.89 MIL/uL — ABNORMAL HIGH (ref 4.22–5.81)
RDW: 14.2 % (ref 11.5–15.5)
WBC: 8.2 10*3/uL (ref 4.0–10.5)
nRBC: 0 % (ref 0.0–0.2)

## 2021-11-03 MED ORDER — MULTAQ 400 MG PO TABS: 400.0000 mg | ORAL_TABLET | Freq: Two times a day (BID) | ORAL | 11 refills | Status: DC

## 2021-11-03 NOTE — Patient Instructions (Signed)
Medication Instructions:  Your physician has recommended you make the following change in your medication:   START Multaq 400 mg twice a day  *If you need a refill on your cardiac medications before your next appointment, please call your pharmacy*   Lab Work: CBC & BMP today over at the [facility] 1st desk on the right to check in (REGISTRATION)  Lab hours: Monday- Friday (7:30 am- 5:30 pm)  If you have labs (blood work) drawn today and your tests are completely normal, you will receive your results only by: MyChart Message (if you have MyChart) OR A paper copy in the mail If you have any lab test that is abnormal or we need to change your treatment, we will call you to review the results.   Testing/Procedures: You are scheduled for a Cardioversion on November 24, 2021 with Dr. Quentin Ore Please arrive at the [facility] at 06:30 a.m. on the day of your procedure.  DIET INSTRUCTIONS:  Nothing to eat or drink after midnight except your medications with a small sip of water.  Medications:  YOU MAY TAKE ALL of your medications with a small amount of water.  Must have a responsible person to drive you home.  Bring a current list of your medications and current insurance cards.    If you have any questions after you get home, please call the office at [phone]    Follow-Up: At [facility], you and your health needs are our priority.  As part of our continuing mission to provide you with exceptional heart care, we have created designated Provider Care Teams.  These Care Teams include your primary Cardiologist (physician) and Advanced Practice Providers (APPs -  Physician Assistants and Nurse Practitioners) who all work together to provide you with the care you need, when you need it.   Your next appointment:   8 week(s)  The format for your next appointment:   In Person  Provider:   You may see Dr. Lars Mage or one of the following Advanced  Practice Providers on your designated Care Team:   Murray Hodgkins, NP Christell Faith, PA-C Cadence Kathlen Mody, PA-C{      Important Information About Sugar

## 2021-11-24 ENCOUNTER — Encounter
Admission: RE | Disposition: A | Discharge status: Home / Self Care | Payer: Self-pay | Source: Home / Self Care | Attending: Internal Medicine

## 2021-11-24 ENCOUNTER — Telehealth: Payer: Self-pay | Admitting: Cardiology

## 2021-11-24 ENCOUNTER — Ambulatory Visit: Payer: No Typology Code available for payment source | Admitting: Certified Registered Nurse Anesthetist

## 2021-11-24 ENCOUNTER — Encounter: Payer: Self-pay | Admitting: Internal Medicine

## 2021-11-24 ENCOUNTER — Ambulatory Visit
Admission: RE | Admit: 2021-11-24 | Discharge: 2021-11-24 | Disposition: A | Discharge status: Home / Self Care | Payer: No Typology Code available for payment source | Attending: Internal Medicine | Admitting: Internal Medicine

## 2021-11-24 DIAGNOSIS — Z7985 Long-term (current) use of injectable non-insulin antidiabetic drugs: Secondary | ICD-10-CM | POA: Diagnosis not present

## 2021-11-24 DIAGNOSIS — I4819 Other persistent atrial fibrillation: Secondary | ICD-10-CM

## 2021-11-24 DIAGNOSIS — Z7901 Long term (current) use of anticoagulants: Secondary | ICD-10-CM | POA: Diagnosis not present

## 2021-11-24 DIAGNOSIS — Z923 Personal history of irradiation: Secondary | ICD-10-CM | POA: Diagnosis not present

## 2021-11-24 DIAGNOSIS — Z9981 Dependence on supplemental oxygen: Secondary | ICD-10-CM | POA: Insufficient documentation

## 2021-11-24 DIAGNOSIS — I129 Hypertensive chronic kidney disease with stage 1 through stage 4 chronic kidney disease, or unspecified chronic kidney disease: Secondary | ICD-10-CM | POA: Diagnosis not present

## 2021-11-24 DIAGNOSIS — Z85118 Personal history of other malignant neoplasm of bronchus and lung: Secondary | ICD-10-CM | POA: Insufficient documentation

## 2021-11-24 DIAGNOSIS — N400 Enlarged prostate without lower urinary tract symptoms: Secondary | ICD-10-CM | POA: Insufficient documentation

## 2021-11-24 DIAGNOSIS — Z9221 Personal history of antineoplastic chemotherapy: Secondary | ICD-10-CM | POA: Diagnosis not present

## 2021-11-24 DIAGNOSIS — Z6838 Body mass index (BMI) 38.0-38.9, adult: Secondary | ICD-10-CM | POA: Diagnosis not present

## 2021-11-24 DIAGNOSIS — E039 Hypothyroidism, unspecified: Secondary | ICD-10-CM | POA: Diagnosis not present

## 2021-11-24 DIAGNOSIS — N1832 Chronic kidney disease, stage 3b: Secondary | ICD-10-CM | POA: Insufficient documentation

## 2021-11-24 DIAGNOSIS — Z7984 Long term (current) use of oral hypoglycemic drugs: Secondary | ICD-10-CM | POA: Diagnosis not present

## 2021-11-24 DIAGNOSIS — J449 Chronic obstructive pulmonary disease, unspecified: Secondary | ICD-10-CM | POA: Insufficient documentation

## 2021-11-24 DIAGNOSIS — J9611 Chronic respiratory failure with hypoxia: Secondary | ICD-10-CM | POA: Insufficient documentation

## 2021-11-24 DIAGNOSIS — I480 Paroxysmal atrial fibrillation: Secondary | ICD-10-CM

## 2021-11-24 DIAGNOSIS — Z87891 Personal history of nicotine dependence: Secondary | ICD-10-CM | POA: Diagnosis not present

## 2021-11-24 DIAGNOSIS — I25119 Atherosclerotic heart disease of native coronary artery with unspecified angina pectoris: Secondary | ICD-10-CM | POA: Diagnosis not present

## 2021-11-24 DIAGNOSIS — E1122 Type 2 diabetes mellitus with diabetic chronic kidney disease: Secondary | ICD-10-CM | POA: Diagnosis not present

## 2021-11-24 HISTORY — PX: CARDIOVERSION: SHX1299

## 2021-11-24 SURGERY — CARDIOVERSION
Anesthesia: General

## 2021-11-24 MED ORDER — SODIUM CHLORIDE 0.9 % IV SOLN: INTRAVENOUS | Status: DC

## 2021-11-24 MED ORDER — PROPOFOL 10 MG/ML IV BOLUS
INTRAVENOUS | Status: AC
  Filled 2021-11-24: qty 40

## 2021-11-24 MED ORDER — PROPOFOL 10 MG/ML IV BOLUS
INTRAVENOUS | Status: DC | PRN
  Administered 2021-11-24: 40 mg via INTRAVENOUS
  Administered 2021-11-24: 20 mg via INTRAVENOUS

## 2021-11-24 NOTE — Anesthesia Procedure Notes (Signed)
Procedure Name: MAC Date/Time: 11/24/2021 12:20 PM  Performed by: Tollie Eth, CRNAPre-anesthesia Checklist: Patient identified, Emergency Drugs available, Suction available and Patient being monitored Patient Re-evaluated:Patient Re-evaluated prior to induction Oxygen Delivery Method: Nasal cannula Induction Type: IV induction Placement Confirmation: positive ETCO2

## 2021-11-24 NOTE — Anesthesia Preprocedure Evaluation (Addendum)
Anesthesia Evaluation    Airway Mallampati: III       Dental   Pulmonary asthma , COPD, Patient abstained from smoking., former smoker (quit 2020),  Lung SCC          Cardiovascular hypertension, + dysrhythmias (a fib on Eliquis)  Rhythm:Irregular Rate:Normal     Neuro/Psych HOH    GI/Hepatic   Endo/Other  Hypothyroidism   Renal/GU      Musculoskeletal   Abdominal   Peds  Hematology   Anesthesia Other Findings Cardiology note 11/03/21:  #Persistent atrial fibrillation On Eliquis for stroke prophylaxis.  Is not can be a candidate for sotalol or Tikosyn given his baseline kidney function (last creatinine 1.7).  Amiodarone is not a good option given his oxygen use and respiratory status.  I will start with a trial of Multaq.  He will take it 2 to 3 weeks and then we will do a cardioversion.  He has not missed any doses of his Eliquis.  If he fails Multaq, he would be in permanent atrial fibrillation.  This was discussed with the patient during today's visit.  We discussed the link between obesity and success rates of antiarrhythmic drugs during today's clinic appointment. Cardioversion was discussed with the patient in detail during today's visit and he wishes to proceed.   #Chronic respiratory failure with hypoxia On supplemental oxygen at home with any exertion.  #Morbid obesity Likely contributing to his shortness of breath.  #Coronary artery disease No ischemic symptoms today.  Precludes safe use of class Ic antiarrhythmic drugs.  Follow-up 8 weeks after cardioversion.  Reproductive/Obstetrics                             Anesthesia Physical Anesthesia Plan  ASA: 3  Anesthesia Plan: General   Post-op Pain Management:    Induction: Intravenous  PONV Risk Score and Plan: 2 and Propofol infusion, TIVA and Treatment may vary due to age or medical condition  Airway Management Planned:  Natural Airway  Additional Equipment:   Intra-op Plan:   Post-operative Plan:   Informed Consent:   Plan Discussed with:   Anesthesia Plan Comments: (Patient had coffee with powdered creamer at 05:30 am on 11/24/21; case to be postponed 6 hours.)       Anesthesia Quick Evaluation

## 2021-11-24 NOTE — CV Procedure (Signed)
    Cardioversion Note  Levi [name] [id number] 10-30-1940  Procedure: DC Cardioversion Indications: Persistent atrial fibrillation  Procedure Details Consent: Obtained Time Out: Verified patient identification, verified procedure, site/side was marked, verified correct patient position, special equipment/implants available, Radiology Safety Procedures followed,  medications/allergies/relevent history reviewed, required imaging and test results available.  Performed  The patient has been on adequate anticoagulation.  The patient received IV propofol by anesthesia services for sedation.  Synchronous cardioversion was performed at 200 Joules x 1.  The cardioversion was successful with restoration of sinus rhythm with Mobitz type 1 second degree AV block.  Complications: No apparent complications Patient did tolerate procedure well.  Recommendations: - Continue apixaban and dronedarone. - Discontinue diltiazem. - Follow-up in the office with Dr. Quentin [name] or APP in ~2 weeks.  Levi [name]., MD 11/24/2021, 1:03 PM

## 2021-11-24 NOTE — Telephone Encounter (Signed)
Lmov to schedule visit   Scheduled only opening available 8/11 at 310   Pending patient call back

## 2021-11-24 NOTE — Interval H&P Note (Signed)
History and Physical Interval Note:  11/24/2021 11:18 AM  Levi [name]  has presented today for surgery, with the diagnosis of persistent atrial fibrillation.  The various methods of treatment have been discussed with the patient and family. After consideration of risks, benefits and other options for treatment, the patient has consented to  Procedure(s): CARDIOVERSION (N/A) as a surgical intervention.  The patient's history has been reviewed, patient examined, no change in status, stable for surgery.  I have reviewed the patient's chart and labs.  Questions were answered to the patient's satisfaction.     Levi [name]

## 2021-11-24 NOTE — Transfer of Care (Signed)
Immediate Anesthesia Transfer of Care Note  Patient: Levi [name]  Procedure(s) Performed: CARDIOVERSION  Patient Location: PACU  Anesthesia Type:General  Level of Consciousness: drowsy  Airway & Oxygen Therapy: Patient Spontanous Breathing and Patient connected to nasal cannula oxygen  Post-op Assessment: Report given to RN and Post -op Vital signs reviewed and stable  Post vital signs: Reviewed and stable  Last Vitals:  Vitals Value Taken Time  BP 112/69 11/24/21 1227  Temp    Pulse 72 11/24/21 1230  Resp 24 11/24/21 1230  SpO2 90 % 11/24/21 1230    Last Pain:  Vitals:   11/24/21 1159  TempSrc:   PainSc: 0-No pain         Complications: No notable events documented.

## 2021-11-24 NOTE — Telephone Encounter (Signed)
-----   Message from [name], MD sent at 11/24/2021  1:01 PM EDT ----- Regarding: Post-cardioversion follow-up Hello,  Mr. Tendler underwent cardioversion today.  He is scheduled for f/u with Ignacia Bayley on 8/30.  Would it be possible to move the follow-up up to 2-3 weeks from now?  It can be with Ignacia Bayley, Dr. Quentin Ore, or any other APP.  Please let me know if any questions or concerns come up.  Thanks.  Chris End

## 2021-11-24 NOTE — Progress Notes (Signed)
[facility]  Levi [name] presents for elective cardioversion this morning.  He reports having drank half a cup of coffee with creamer at 530 this morning.  This was discussed with Levi [name], his wife, and anesthesia.  We have agreed to postpone his cardioversion until after 11:30 AM.  He was advised to remain n.p.o. during this time.  He has already taken his morning doses of apixaban and dronedarone.  [name], MD [facility] 11/24/21 7:37 AM

## 2021-11-25 ENCOUNTER — Encounter: Payer: Self-pay | Admitting: Internal Medicine

## 2021-11-25 NOTE — Anesthesia Postprocedure Evaluation (Signed)
Anesthesia Post Note  Patient: Levi [name]  Procedure(s) Performed: CARDIOVERSION  Patient location during evaluation: PACU Anesthesia Type: General Level of consciousness: awake and alert Pain management: pain level controlled Vital Signs Assessment: post-procedure vital signs reviewed and stable Respiratory status: spontaneous breathing, nonlabored ventilation, respiratory function stable and patient connected to nasal cannula oxygen Cardiovascular status: blood pressure returned to baseline and stable Postop Assessment: no apparent nausea or vomiting Anesthetic complications: no   No notable events documented.   Last Vitals:  Vitals:   11/24/21 1300 11/24/21 1315  BP: 127/86 132/70  Pulse: (!) 58 (!) 135  Resp: 17 12  Temp:    SpO2: 92% (!) 89%    Last Pain:  Vitals:   11/24/21 1315  TempSrc:   PainSc: 0-No pain                 Dimas Millin

## 2021-12-09 NOTE — Progress Notes (Signed)
Cardiology Clinic Note   Patient Name: Levi [name] Date of Encounter: 12/10/2021  Primary Care Provider:  [place] Primary Cardiologist:  Kate Sable, MD  Patient Profile    81 year old male with a past medical history BPH, lung cancer post chemoradiation, COPD, hypothyroidism, diabetes, CKD 3B and atrial fibrillation status post cardioversion, who is here to follow-up on his atrial fibrillation.  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[place])    Prostate enlargement    Shortness of breath    Squamous cell carcinoma of lung, left ([place])    Thyroid disease    Tobacco abuse    Past Surgical History:  Procedure Laterality Date   CARDIOVERSION N/A    CARDIOVERSION N/A 11/24/2021   Procedure: CARDIOVERSION;  Surgeon: [name], MD;  Location: [facility] ORS;  Service: Cardiovascular;  Laterality: N/A;   ELECTROMAGNETIC NAVIGATION BROCHOSCOPY Left 10/19/2018   Procedure: ELECTROMAGNETIC NAVIGATION BRONCHOSCOPY LEFT;  Surgeon: Tyler Pita, MD;  Location: [facility] ORS;  Service: Cardiopulmonary;  Laterality: Left;   ENDOBRONCHIAL ULTRASOUND Left 10/19/2018   Procedure: ENDOBRONCHIAL ULTRASOUND LEFT;  Surgeon: Tyler Pita, MD;  Location: [facility] ORS;  Service: Cardiopulmonary;  Laterality: Left;   MELANOMA EXCISION Left    NO PAST SURGERIES      Allergies  No Known Allergies  History of Present Illness    81 year old male with past medical history as mentioned above persistent fibrillation status post elective cardioversion, BPH, lung cancer status post chemoradiation, COPD, hypothyroidism, diabetes, CKD 3B, and obesity.  He was last seen by the [facility] cardiologist in July 06/2021.  His diagnosis of atrial fibrillation dates back to 2020 when he passed out at work.  He  had previously worn a heart monitor which showed 100% burden of atrial fibrillation.  Echo prior in the [place] system showed normal left ventricular function.  He did have a bout of pneumonia in 2022 requiring hospitalization and oxygen therapy.  He continues to be quite short of breath and requires intermittent oxygen.  He did undergo pulmonary stress test which showed atrial fibrillation with rates fluctuating between 30s and high rates.  He did have a weight gain of around 60 pounds and complained of constantly being short of breath with any exertion he was using supplemental oxygen therapy.  On 11/24/2021 he did undergo an elective direct-current cardioversion for his persistent atrial fibrillation.  He had synchronized cardioversion that was performed at 200 J x 1.  He had restoration of sinus rhythm with Mobitz type I second-degree AV block.  There were no apparent complications and the patient tolerated the procedure well.  The recommendation at that time was to continue his apixaban and dronedarone and discontinue diltiazem.  He returns to clinic today to follow-up on his atrial fibrillation status post cardioversion.  He is accompanied by his wife.  He denies any chest pain, shortness of breath, but does endorse occasional dizziness and peripheral edema.  His symptoms have improved some since his cardioversion.  Unfortunately he has been continuing to take it easy since his procedure so he has not been very active in the house or walking around to determine if his heart rate is still controlled on his current medication regimen.  His  primary care is provided by the [place].  He has had no recent hospitalizations or visits to the emergency department.  Unfortunately with being in atrial fibrillation still today he is little depressed about that stating that on his previous visit he was told that he may just continue to remain in atrial fibrillation.  Home Medications    Current Outpatient Medications   Medication Sig Dispense Refill   albuterol (PROVENTIL HFA;VENTOLIN HFA) 108 (90 Base) MCG/ACT inhaler Inhale 2 puffs into the lungs every 6 (six) hours as needed for wheezing or shortness of breath. 1 Inhaler 0   budesonide-formoterol (SYMBICORT) 160-4.5 MCG/ACT inhaler Inhale 2 puffs into the lungs 2 (two) times daily.     carboxymethylcellulose (REFRESH PLUS) 0.5 % SOLN 1 drop 4 (four) times daily as needed.     dronedarone (MULTAQ) 400 MG tablet Take 1 tablet (400 mg total) by mouth 2 (two) times daily with a meal. 60 tablet 11   finasteride (PROSCAR) 5 MG tablet Take 5 mg by mouth daily.     fluticasone-salmeterol (ADVAIR) 250-50 MCG/ACT AEPB Inhale 1 puff into the lungs in the morning and at bedtime.     glimepiride (AMARYL) 2 MG tablet Take 1 tablet (2 mg total) by mouth every morning. 30 tablet 0   levothyroxine (SYNTHROID) 137 MCG tablet Take 200 mcg by mouth daily before breakfast.     SEMAGLUTIDE,0.25 OR 0.5MG /DOS, [place] Inject 0.25 mg into the skin once a week.     tamsulosin (FLOMAX) 0.4 MG CAPS capsule TAKE ONE CAPSULE BY MOUTH DAILY FOR PROSTATE     vitamin B-12 (CYANOCOBALAMIN) 1000 MCG tablet Take 1,000 mcg by mouth daily.     ALPRAZolam (XANAX) 0.5 MG tablet Take 0.5 mg by mouth at bedtime as needed for anxiety. (Patient not taking: Reported on 08/04/2021)     apixaban (ELIQUIS) 5 MG TABS tablet Take 1 tablet (5 mg total) by mouth 2 (two) times daily. 180 tablet 3   pantoprazole (PROTONIX) 20 MG tablet Take 1 tablet (20 mg total) by mouth daily for 14 days. (Patient not taking: Reported on 08/04/2021) 14 tablet 0   Vitamin D, Ergocalciferol, (DRISDOL) 1.25 MG (50000 UT) CAPS capsule Take 50,000 Units by mouth every 7 (seven) days. (Patient not taking: Reported on 08/04/2021)     No current facility-administered medications for this visit.     Family History    Family History  Problem Relation Age of Onset   Aneurysm Mother    Cancer Father    He indicated that his mother is  deceased. He indicated that his father is deceased. He indicated that his sister is alive. He indicated that his brother is alive. He indicated that his daughter is alive. He indicated that his son is alive.  Social History    Social History   Socioeconomic History   Marital status: Married    Spouse name: [name]   Number of children: 5   Years of education: Not on file   Highest education level: Not on file  Occupational History   Not on file  Tobacco Use   Smoking status: Former    Packs/day: 0.25    Years: 62.00    Total pack years: 15.50    Types: Cigarettes    Quit date: 09/05/2018    Years since quitting: 3.2   Smokeless tobacco: Never  Vaping Use   Vaping Use: Never used  Substance and Sexual Activity   Alcohol use: No    Alcohol/week: 0.0 standard  drinks of alcohol   Drug use: No   Sexual activity: Not on file  Other Topics Concern   Not on file  Social History Narrative   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 He is married to his wife of 30+ years, [name] and has 5 children (1 deceased), many grand children, and one great grand child.    Social Determinants of Health   Financial Resource Strain: Low Risk  (12/04/2018)   Overall Financial Resource Strain (CARDIA)    Difficulty of Paying Living Expenses: Not very hard  Food Insecurity: No Food Insecurity (12/04/2018)   Hunger Vital Sign    Worried About Running Out of Food in the Last Year: Never true    Ran Out of Food in the Last Year: Never true  Transportation Needs: No Transportation Needs (12/04/2018)   PRAPARE - Hydrologist (Medical): No    Lack of Transportation (Non-Medical): No  Physical Activity: Not on file  Stress: Stress Concern Present (12/04/2018)   [place]    Feeling of Stress : Very much  Social Connections: Unknown (12/04/2018)   Social Connection and Isolation Panel [NHANES]    Frequency of Communication with Friends and Family: More than three times a week    Frequency of Social Gatherings with Friends and Family: Once a week    Attends Religious Services: Not on Advertising copywriter or Organizations: Not on file    Attends Archivist Meetings: Not on file    Marital Status: Not on file  Intimate Partner Violence: Not on file     Review of Systems    General:  No chills, fever, night sweats or weight changes.  Endorses fatigue Cardiovascular:  No chest pain, endorses dyspnea on exertion, endorses edema, orthopnea, palpitations, paroxysmal nocturnal dyspnea. Dermatological: No rash, lesions/masses Respiratory: No cough, endorses dyspnea Urologic: No hematuria, dysuria Abdominal:   No nausea, vomiting, diarrhea, bright red blood per rectum, melena, or hematemesis Neurologic:  No visual changes, wkns, changes in mental status. All other systems reviewed and are otherwise negative except as noted above.   Physical Exam    VS:  BP 112/70 (BP Location: Left Arm, Patient Position: Sitting, Cuff Size: Normal)   Pulse 61   Ht 5\' 11"  (1.803 m)   Wt 272 lb 6.4 oz (123.6 kg)   SpO2 93%   BMI 37.99 kg/m  , BMI Body mass index is 37.99 kg/m.     GEN: Well nourished, well developed, in no acute distress. HEENT: normal.  Hearing aids in Neck: Supple, no JVD, carotid bruits, or masses. Cardiac: Irregularly irregular, no murmurs, rubs, or gallops. No clubbing, cyanosis, trace pretibial edema.  Radials/DP/PT 2+ and equal bilaterally.  Respiratory:  Respirations regular and unlabored, clear to auscultation bilaterally. GI: Soft, nontender, nondistended, obese BS + x 4. MS: no deformity or atrophy. Skin: warm and dry, no rash. Neuro:  Strength and sensation are intact. Psych: Normal affect.  Accessory  Clinical Findings    ECG personally reviewed by me today-rate controlled atrial fibrillation 60-70 with a left anterior fascicular block- No acute changes  Lab Results  Component Value Date   WBC 8.2 11/03/2021   HGB 17.4 (H) 11/03/2021   HCT 53.8 (H)  11/03/2021   MCV 91.3 11/03/2021   PLT 192 11/03/2021   Lab Results  Component Value Date   CREATININE 1.63 (H) 11/03/2021   BUN 24 (H) 11/03/2021   NA 138 11/03/2021   K 4.0 11/03/2021   CL 110 11/03/2021   CO2 19 (L) 11/03/2021   Lab Results  Component Value Date   ALT 35 07/18/2019   AST 29 07/18/2019   ALKPHOS 51 07/18/2019   BILITOT 0.8 07/18/2019   No results found for: "CHOL", "HDL", "LDLCALC", "LDLDIRECT", "TRIG", "CHOLHDL"  Lab Results  Component Value Date   HGBA1C 7.8 (H) 03/09/2021    Assessment & Plan   1.  Persistent atrial fibrillation status post cardioversion.  Patient remains in atrial fibrillation today but is rate controlled and notes symptom improvement.  He we will continue on Eliquis 5 mg twice daily for stroke prophylaxis as he does not meet minimum criteria for reduction.  He was previously seen by EP and was not a candidate for sotalol or Tikosyn given baseline kidney function of his last creatinine being 1.7.  Amiodarone would not be an ideal option given his oxygen use and respiratory status as well.  He was started on Multaq which he continues today.  He was advised by Dr. Quentin Ore on his last visit if he fails Multaq that he would be in permanent atrial fibrillation as there was a link discussed at his visit between obesity and success rates of antiarrhythmic drugs.   2.  Coronary artery disease which is chest pain-free at this time.  EKG today shows atrial fibrillation.  Apixaban in place of aspirin.  3.  Chronic respiratory failure with hypoxia requiring supplemental oxygen on occasion.  He is on room air today.  Stating that his shortness of breath is slightly improved since cardioversion.  He has  been encouraged to increase his activity daily to see how he tolerates after the cardioversion with a lower heart rate to see if he continues to have worsening exertional dyspnea.  We did advise him to start small with only walking up and down the hallway at home.  4.  Morbid obesity which likely contributes to his shortness of breath as well as deconditioning.  He is continue to make dietary changes and is trying to increase his activity.  5.  Disposition patient to continue with his current medication regimen and to follow-up with Dr. Caryl Comes on at next available appointment to discuss other options for atrial fibrillation.   Richie Bonanno, NP 12/10/2021, 4:40 PM

## 2021-12-10 ENCOUNTER — Ambulatory Visit (INDEPENDENT_AMBULATORY_CARE_PROVIDER_SITE_OTHER): Payer: No Typology Code available for payment source | Admitting: Cardiology

## 2021-12-10 ENCOUNTER — Encounter: Payer: Self-pay | Admitting: Cardiology

## 2021-12-10 ENCOUNTER — Telehealth: Payer: Self-pay | Admitting: Cardiology

## 2021-12-10 VITALS — BP 112/70 | HR 61 | Ht 71.0 in | Wt 272.4 lb

## 2021-12-10 DIAGNOSIS — J9611 Chronic respiratory failure with hypoxia: Secondary | ICD-10-CM

## 2021-12-10 DIAGNOSIS — I25119 Atherosclerotic heart disease of native coronary artery with unspecified angina pectoris: Secondary | ICD-10-CM

## 2021-12-10 DIAGNOSIS — I4819 Other persistent atrial fibrillation: Secondary | ICD-10-CM

## 2021-12-10 NOTE — Telephone Encounter (Signed)
Patient would like a second opinion from EP and prefers to ask Dr. Caryl Comes.   Is it ok with both providers to schedule with klein?  [place] as I may not  access inbox to follow up

## 2021-12-10 NOTE — Patient Instructions (Signed)
Medication Instructions:  Your physician recommends that you continue on your current medications as directed. Please refer to the Current Medication list given to you today.  *If you need a refill on your cardiac medications before your next appointment, please call your pharmacy*   Lab Work: None ordered  If you have labs (blood work) drawn today and your tests are completely normal, you will receive your results only by: [place] (if you have MyChart) OR A paper copy in the mail If you have any lab test that is abnormal or we need to change your treatment, we will call you to review the results.   Testing/Procedures: None ordered   Follow-Up: At [facility], you and your health needs are our priority.  As part of our continuing mission to provide you with exceptional heart care, we have created designated Provider Care Teams.  These Care Teams include your primary Cardiologist (physician) and Advanced Practice Providers (APPs -  Physician Assistants and Nurse Practitioners) who all work together to provide you with the care you need, when you need it.  We recommend signing up for the patient portal called "MyChart".  Sign up information is provided on this After Visit Summary.  MyChart is used to connect with patients for Virtual Visits (Telemedicine).  Patients are able to view lab/test results, encounter notes, upcoming appointments, etc.  Non-urgent messages can be sent to your provider as well.   To learn more about what you can do with MyChart, go to [email or web address].    Your next appointment:   Options for atrial fibrillation with Dr. Caryl Comes  The format for your next appointment:   In Person  Provider:   Virl Axe, MD    Other Instructions N/A  Important Information About Sugar

## 2021-12-16 NOTE — Telephone Encounter (Signed)
[place], [place] - 12/10/2021  4:15 PM Vickie Epley, MD  Sent: Fri December 10, 2021  8:34 PM  To: Ella Jubilee, Revonda Standard, MD  Cc: Emily Filbert, RN          Message  Yes. OK to change to [place].  CL

## 2021-12-16 NOTE — Telephone Encounter (Signed)
Lvm to schedule appt with Dr. Caryl Comes

## 2021-12-16 NOTE — Telephone Encounter (Signed)
Levi Cirri,  Do you mind reaching out to this patient to schedule with Dr. Caryl Comes.  It looks like, per Anderson Malta, this was for a 2nd opinion, so I'm not certain he completely wants to switch from Dr. Quentin Ore.   He lives in [place], so could offer here or [place].  Thanks!

## 2021-12-29 ENCOUNTER — Ambulatory Visit: Payer: No Typology Code available for payment source | Admitting: Nurse Practitioner

## 2021-12-30 ENCOUNTER — Encounter: Payer: Self-pay | Admitting: Internal Medicine

## 2021-12-30 ENCOUNTER — Ambulatory Visit: Payer: No Typology Code available for payment source | Attending: Internal Medicine | Admitting: Internal Medicine

## 2021-12-30 VITALS — BP 110/80 | HR 55 | Ht 71.0 in | Wt 272.4 lb

## 2021-12-30 DIAGNOSIS — I4819 Other persistent atrial fibrillation: Secondary | ICD-10-CM | POA: Diagnosis not present

## 2021-12-30 NOTE — Patient Instructions (Addendum)
Medication Instructions:  - Your physician has recommended you make the following change in your medication:   1) STOP Multaq   *If you need a refill on your cardiac medications before your next appointment, please call your pharmacy*   Lab Work: - none ordered  If you have labs (blood work) drawn today and your tests are completely normal, you will receive your results only by: [place] (if you have MyChart) OR A paper copy in the mail If you have any lab test that is abnormal or we need to change your treatment, we will call you to review the results.   Testing/Procedures: - none ordered   Follow-Up: At [facility], you and your health needs are our priority.  As part of our continuing mission to provide you with exceptional heart care, we have created designated Provider Care Teams.  These Care Teams include your primary Cardiologist (physician) and Advanced Practice Providers (APPs -  Physician Assistants and Nurse Practitioners) who all work together to provide you with the care you need, when you need it.  We recommend signing up for the patient portal called "MyChart".  Sign up information is provided on this After Visit Summary.  MyChart is used to connect with patients for Virtual Visits (Telemedicine).  Patients are able to view lab/test results, encounter notes, upcoming appointments, etc.  Non-urgent messages can be sent to your provider as well.   To learn more about what you can do with MyChart, go to [email or web address].    Your next appointment:   4-6 week(s)  The format for your next appointment:   In Person/ Virtual Visit  Provider:   Virl Axe, MD    Other Instructions N/a  Important Information About Sugar

## 2021-12-30 NOTE — Progress Notes (Signed)
ELECTROPHYSIOLOGY OFFICE NOTE  Patient ID: Levi [name], MRN: [id number], DOB/AGE: [birth year] 81 y.o. Admit date: (Not on file) Date of Consult: 12/30/2021  Primary Physician: [place] Primary Cardiologist: [place] [facility] prev [facility]     Levi [name] is a 81 y.o. male who is being seen today for the evaluation of atrial fibrillation at their own request for second opinion   HPI Levi [name] is a 81 y.o. male with a diagnosis of atrial fibrillation initially  2020 followed on an episode of syncope.  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 Thought possibly due to been dehydrated.  He converted spontaneously.  ECG from the [facility] 3/21 he was noted to be in sinus rhythm.  The next ECG at the [place] was 2/23 and was noted to be in A-fib.  There is an intervening ECG in the [facility] system 11/22 when he is in sinus rhythm.  His complaint is dyspnea.  It is his impression that this is linked to his atrial fibrillation and it dates back to his lung cancer treatment.  His meeting with Dr. Daune Perch regarding antiarrhythmic therapy was dismissing of dofetilide because of renal issues amiodarone because of lung issues an attempt was made to cardiovert him with Multaq support.  Cardioversion was successful and he reverted rapidly to atrial fibrillation he comes in today in atrial fibrillation.  He asserts that he was never sick until his lung cancer.  We reviewed the pulmonary function testing from prior to the treatment where he was noted to have moderate COPD.  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[place])    Prostate  enlargement    Shortness of breath    Squamous cell carcinoma of lung, left (HCC)    Thyroid disease    Tobacco abuse       Surgical History:  Past Surgical History:  Procedure Laterality Date   CARDIOVERSION N/A    CARDIOVERSION N/A 11/24/2021   Procedure: CARDIOVERSION;  Surgeon: [name], MD;  Location: [facility] ORS;  Service: Cardiovascular;  Laterality: N/A;   ELECTROMAGNETIC NAVIGATION BROCHOSCOPY Left 10/19/2018   Procedure: ELECTROMAGNETIC NAVIGATION BRONCHOSCOPY LEFT;  Surgeon: Tyler Pita, MD;  Location: [facility] ORS;  Service: Cardiopulmonary;  Laterality: Left;   ENDOBRONCHIAL ULTRASOUND Left 10/19/2018   Procedure: ENDOBRONCHIAL ULTRASOUND LEFT;  Surgeon: Tyler Pita, MD;  Location: [facility] ORS;  Service: Cardiopulmonary;  Laterality: Left;   MELANOMA EXCISION Left    NO PAST SURGERIES       Home Meds: Current Meds  Medication Sig   albuterol (PROVENTIL HFA;VENTOLIN HFA) 108 (90 Base) MCG/ACT inhaler Inhale 2 puffs into the lungs every 6 (six) hours as needed for wheezing or shortness of breath.   apixaban (ELIQUIS) 5 MG TABS tablet Take 1 tablet (5 mg total) by mouth 2 (two) times daily.   budesonide-formoterol (SYMBICORT) 160-4.5 MCG/ACT inhaler Inhale 2 puffs  into the lungs 2 (two) times daily.   carboxymethylcellulose (REFRESH PLUS) 0.5 % SOLN 1 drop 4 (four) times daily as needed.   finasteride (PROSCAR) 5 MG tablet Take 5 mg by mouth daily.   fluticasone-salmeterol (ADVAIR) 250-50 MCG/ACT AEPB Inhale 1 puff into the lungs in the morning and at bedtime.   glimepiride (AMARYL) 2 MG tablet Take 1 tablet (2 mg total) by mouth every morning.   levothyroxine (SYNTHROID) 200 MCG tablet Take 200 mcg by mouth daily before breakfast.   pantoprazole (PROTONIX) 20 MG tablet Take 1 tablet (20 mg total) by mouth daily for 14 days.   SEMAGLUTIDE,0.25 OR 0.5MG /DOS, [place] Inject 0.25 mg into the skin once a week.   tamsulosin (FLOMAX) 0.4 MG CAPS capsule 0.8 mg daily.    vitamin B-12 (CYANOCOBALAMIN) 1000 MCG tablet Take 1,000 mcg by mouth daily.   [DISCONTINUED] dronedarone (MULTAQ) 400 MG tablet Take 1 tablet (400 mg total) by mouth 2 (two) times daily with a meal.    Allergies: No Known Allergies  Social History   Socioeconomic History   Marital status: Married    Spouse name: [name]   Number of children: 5   Years of education: Not on file   Highest education level: Not on file  Occupational History   Not on file  Tobacco Use   Smoking status: Former    Packs/day: 0.25    Years: 62.00    Total pack years: 15.50    Types: Cigarettes    Quit date: 09/05/2018    Years since quitting: 3.3   Smokeless tobacco: Never  Vaping Use   Vaping Use: Never used  Substance and Sexual Activity   Alcohol use: No    Alcohol/week: 0.0 standard drinks of alcohol   Drug use: No   Sexual activity: Not on file  Other Topics Concern   Not on file  Social History Narrative   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 He is married to his wife of 30+ years, [name] and has 5 children (1 deceased), many grand children, and one great grand child.    Social Determinants of Health   Financial Resource Strain: Low Risk  (12/04/2018)   Overall Financial Resource Strain (CARDIA)    Difficulty of Paying Living Expenses: Not very hard  Food Insecurity: No Food Insecurity (12/04/2018)   Hunger Vital Sign    Worried About Running Out of Food in the Last Year: Never true    Ran Out of Food in the Last Year: Never true  Transportation Needs: No Transportation Needs (12/04/2018)   PRAPARE - Hydrologist (Medical): No    Lack of Transportation (Non-Medical): No  Physical Activity: Not on file  Stress: Stress Concern Present (12/04/2018)   [place]    Feeling of Stress : Very much  Social Connections: Unknown (12/04/2018)   Social Connection and Isolation Panel [NHANES]    Frequency of Communication with Friends and Family: More than three times a week    Frequency of Social Gatherings with Friends and Family: Once a week    Attends Religious Services: Not on Diplomatic Services operational officer of Clubs or Organizations: Not on file    Attends Archivist Meetings: Not on file  Marital Status: Not on file  Intimate Partner Violence: Not on file     Family History  Problem Relation Age of Onset   Aneurysm Mother    Cancer Father      ROS:  Please see the history of present illness.     All other systems reviewed and negative.    Physical Exam: Blood pressure 110/80, pulse (!) 55, height 5\' 11"  (1.803 m), weight 272 lb 6 oz (123.5 kg), SpO2 94 %. General: Well developed, well nourished male in no acute distress. Head: Normocephalic, atraumatic, sclera non-icteric, no xanthomas, nares are without discharge. EENT: normal  Lymph Nodes:  none Neck: Negative for carotid bruits. JVD not elevated. Back:without scoliosis kyphosis Lungs: Clear bilaterally to auscultation without wheezes, rales, or rhonchi. Breathing is unlabored. Heart: RRR with S1 S2. No  murmur . No rubs, or gallops appreciated. Abdomen: Soft, non-tender, non-distended with normoactive bowel sounds. No hepatomegaly. No rebound/guarding. No obvious abdominal masses. Msk:  Strength and tone appear normal for age. Extremities: No clubbing or cyanosis. No  edema.  Distal pedal pulses are 2+ and equal bilaterally. Skin: Warm and Dry Neuro: Alert and oriented X 3. CN III-XII intact Grossly normal sensory and motor function . Psych:  Responds to questions appropriately with a normal affect.        EKG: atrial fib 55 -/09/42   Assessment and Plan:  Atrial fibrillation-persistent  Dyspnea on exertion  COPD  Lung cancer status post  chemo/radiation therapy  Syncope  No insufficiency grade 3B  Coronary artery disease  Polycythemia  The patient is exceedingly frustrated by his dyspnea and in his mind is linked to his atrial fibrillation.  We reviewed extensively the ECGs that we have and the fact that his dyspnea has been persistent and relatively nonvariable since the initial presentation with syncope suggests that the atrial fibrillation may not be the problem as the only data we have from 3/21--11/22 are 2 ECGs okay ending those dates with him in sinus rhythm.  Apparently he has an echocardiogram at the [place] that showed normal left ventricular function.  We do not have data regarding left atrial size pulmonary pressures or right heart function.  He carries a diagnosis of coronary disease the specifics of which are not available.  I recommend that he have Myoview testing.  Also wonder whether he be a candidate for an SGLT2 and I reached out to the [place] about these last 2 issues and have suggested to the family that they also need pulmonary follow-up.  I have offered to them the idea of dofetilide initiation at 125-250 mcg twice daily as tolerated related to his renal function with repeat cardioversion to try to confirm the observation suggested above that his dyspnea is unrelated to his atrial fibrillation.  We will think about this once we get the involvement data back  As noted above, his main complaint is that he is quite frustrated and does not want to accept the diagnosis of atrial fibrillation with nothing to do  As I finished this note I noticed that his hemoglobin was 17.4 in July.  This will need to be repeated.  If valid, we will check his O2 saturations at night and then consider the possibility of polycythemia vera Levi [name]

## 2021-12-31 ENCOUNTER — Telehealth: Payer: Self-pay | Admitting: Internal Medicine

## 2021-12-31 DIAGNOSIS — D582 Other hemoglobinopathies: Secondary | ICD-10-CM

## 2021-12-31 NOTE — Telephone Encounter (Signed)
Secure chat received from Dr. Caryl Comes today stating:  Levi [name], could you let him know that the ECG from 3/22 also shows sinus rhythm and also that his hemoglobin is 17.4 and this needs to be repeated when he is thank you very much   I called and spoke with the patient's wife regarding Dr. Olin [name] comments/ recommendations as stated above.   She voices understanding and is agreeable.

## 2022-01-05 ENCOUNTER — Other Ambulatory Visit
Admission: RE | Admit: 2022-01-05 | Discharge: 2022-01-05 | Disposition: A | Discharge status: Home / Self Care | Payer: No Typology Code available for payment source | Source: Ambulatory Visit | Attending: Internal Medicine | Admitting: Internal Medicine

## 2022-01-05 DIAGNOSIS — D582 Other hemoglobinopathies: Secondary | ICD-10-CM | POA: Insufficient documentation

## 2022-01-05 LAB — CBC WITH DIFFERENTIAL/PLATELET
Abs Immature Granulocytes: 0.06 10*3/uL (ref 0.00–0.07)
Basophils Absolute: 0.1 10*3/uL (ref 0.0–0.1)
Basophils Relative: 1 %
Eosinophils Absolute: 0.4 10*3/uL (ref 0.0–0.5)
Eosinophils Relative: 5 %
HCT: 51.7 % (ref 39.0–52.0)
Hemoglobin: 16.7 g/dL (ref 13.0–17.0)
Immature Granulocytes: 1 %
Lymphocytes Relative: 18 %
Lymphs Abs: 1.5 10*3/uL (ref 0.7–4.0)
MCH: 29.7 pg (ref 26.0–34.0)
MCHC: 32.3 g/dL (ref 30.0–36.0)
MCV: 92 fL (ref 80.0–100.0)
Monocytes Absolute: 0.8 10*3/uL (ref 0.1–1.0)
Monocytes Relative: 10 %
Neutro Abs: 5.4 10*3/uL (ref 1.7–7.7)
Neutrophils Relative %: 65 %
Platelets: 197 10*3/uL (ref 150–400)
RBC: 5.62 MIL/uL (ref 4.22–5.81)
RDW: 14.3 % (ref 11.5–15.5)
WBC: 8.2 10*3/uL (ref 4.0–10.5)
nRBC: 0 % (ref 0.0–0.2)

## 2022-01-31 ENCOUNTER — Institutional Professional Consult (permissible substitution)
Payer: No Typology Code available for payment source | Admitting: Student in an Organized Health Care Education/Training Program

## 2022-01-31 ENCOUNTER — Ambulatory Visit
Admission: RE | Admit: 2022-01-31 | Discharge: 2022-01-31 | Disposition: A | Discharge status: Home / Self Care | Payer: No Typology Code available for payment source | Source: Ambulatory Visit | Attending: Nurse Practitioner | Admitting: Nurse Practitioner

## 2022-01-31 DIAGNOSIS — Z85118 Personal history of other malignant neoplasm of bronchus and lung: Secondary | ICD-10-CM | POA: Diagnosis present

## 2022-01-31 DIAGNOSIS — Z08 Encounter for follow-up examination after completed treatment for malignant neoplasm: Secondary | ICD-10-CM | POA: Insufficient documentation

## 2022-01-31 DIAGNOSIS — C3492 Malignant neoplasm of unspecified part of left bronchus or lung: Secondary | ICD-10-CM | POA: Diagnosis present

## 2022-02-01 ENCOUNTER — Ambulatory Visit (INDEPENDENT_AMBULATORY_CARE_PROVIDER_SITE_OTHER)
Payer: No Typology Code available for payment source | Admitting: Student in an Organized Health Care Education/Training Program

## 2022-02-01 ENCOUNTER — Encounter: Payer: Self-pay | Admitting: Student in an Organized Health Care Education/Training Program

## 2022-02-01 VITALS — BP 120/72 | HR 79 | Temp 98.3°F | Ht 71.0 in | Wt 272.2 lb

## 2022-02-01 DIAGNOSIS — J439 Emphysema, unspecified: Secondary | ICD-10-CM

## 2022-02-01 MED ORDER — SPIRIVA RESPIMAT 2.5 MCG/ACT IN AERS: 2.0000 | INHALATION_SPRAY | Freq: Every day | RESPIRATORY_TRACT | 11 refills | Status: DC

## 2022-02-01 NOTE — Progress Notes (Unsigned)
[place]  [phone] [phone]) [phone]  ID: [name] OB: 01-01-1941  MR#: [id number]  WCB#:[id number]  Patient Care Team: [place] as PCP - General (General Practice) Kate Sable, MD as PCP - Cardiology (Cardiology) Vickie Epley, MD as PCP - Electrophysiology (Cardiology) Telford Nab, RN as Registered Nurse Grayland Ormond, Kathlene November, MD as Consulting Physician (Oncology) Noreene Filbert, MD as Referring Physician (Radiation Oncology) Nestor Lewandowsky, MD (Inactive) as Referring Physician (Cardiothoracic Surgery)  CHIEF COMPLAINT: Clinical stage IIB squamous cell carcinoma, left upper lobe lung.  INTERVAL HISTORY: Patient returns to clinic today for routine 8-month evaluation and discussion of his imaging results.  He is anxious, but otherwise feels well.  He has no neurologic complaints.  He denies any recent fevers or illnesses.  He denies any chest pain, shortness of breath, cough, or hemoptysis.  He has a good appetite and denies weight loss.  He denies any nausea, vomiting, constipation, or diarrhea.  He has no urinary complaints.  Patient offers no specific complaints today.  REVIEW OF SYSTEMS:   Review of Systems  Constitutional: Negative.  Negative for fever, malaise/fatigue and weight loss.  HENT: Negative.  Negative for congestion.   Respiratory: Negative.  Negative for cough, hemoptysis and shortness of breath.   Cardiovascular: Negative.  Negative for chest pain and leg swelling.  Gastrointestinal: Negative.  Negative for abdominal pain and nausea.  Genitourinary: Negative.  Negative for dysuria.  Musculoskeletal: Negative.  Negative for back pain.  Skin:  Negative for rash.  Neurological: Negative.  Negative for dizziness, focal weakness, weakness and headaches.  Endo/Heme/Allergies:  Does not bruise/bleed easily.  Psychiatric/Behavioral:  Negative for depression. The patient is nervous/anxious.     As per HPI.  Otherwise, a complete review of systems is negative.  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[place])    Prostate enlargement    Shortness of breath    Squamous cell carcinoma of lung, left ([place])    Thyroid disease    Tobacco abuse     PAST SURGICAL HISTORY: Past Surgical History:  Procedure Laterality Date   CARDIOVERSION N/A    CARDIOVERSION N/A 11/24/2021   Procedure: CARDIOVERSION;  Surgeon: [name], MD;  Location: [facility] ORS;  Service: Cardiovascular;  Laterality: N/A;   ELECTROMAGNETIC NAVIGATION BROCHOSCOPY Left 10/19/2018   Procedure: ELECTROMAGNETIC NAVIGATION BRONCHOSCOPY LEFT;  Surgeon: Tyler Pita, MD;  Location: [facility] ORS;  Service: Cardiopulmonary;  Laterality: Left;   ENDOBRONCHIAL ULTRASOUND Left 10/19/2018   Procedure: ENDOBRONCHIAL ULTRASOUND LEFT;  Surgeon: Tyler Pita, MD;  Location: [facility] ORS;  Service: Cardiopulmonary;  Laterality: Left;   MELANOMA EXCISION Left    NO PAST SURGERIES      FAMILY HISTORY: Family History  Problem Relation Age of Onset   Aneurysm Mother    Cancer Father     ADVANCED DIRECTIVES (Y/N):  N  HEALTH MAINTENANCE: Social History   Tobacco Use   Smoking status: Former    Packs/day: 0.25    Years: 62.00    Total pack years: 15.50    Types: Cigarettes    Quit date: 09/05/2018    Years since quitting: 3.4   Smokeless tobacco: Never  Vaping Use   Vaping Use: Never used  Substance Use  Topics   Alcohol use: No    Alcohol/week: 0.0 standard drinks of alcohol   Drug use: No     Colonoscopy:  PAP:  Bone density:  Lipid panel:  No Known Allergies  Current Outpatient Medications  Medication Sig Dispense Refill   albuterol (PROVENTIL HFA;VENTOLIN HFA) 108 (90 Base) MCG/ACT inhaler Inhale 2 puffs into the lungs every 6  (six) hours as needed for wheezing or shortness of breath. 1 Inhaler 0   carboxymethylcellulose (REFRESH PLUS) 0.5 % SOLN 1 drop 4 (four) times daily as needed.     finasteride (PROSCAR) 5 MG tablet Take 5 mg by mouth daily.     fluticasone-salmeterol (ADVAIR) 250-50 MCG/ACT AEPB Inhale 1 puff into the lungs in the morning and at bedtime.     glimepiride (AMARYL) 2 MG tablet Take 1 tablet (2 mg total) by mouth every morning. 30 tablet 0   levothyroxine (SYNTHROID) 200 MCG tablet Take 200 mcg by mouth daily before breakfast.     SEMAGLUTIDE,0.25 OR 0.5MG /DOS, [place] Inject 0.25 mg into the skin once a week.     tamsulosin (FLOMAX) 0.4 MG CAPS capsule 0.8 mg daily.     Tiotropium Bromide Monohydrate (SPIRIVA RESPIMAT) 2.5 MCG/ACT AERS Inhale 2 puffs into the lungs daily. 1 each 11   vitamin B-12 (CYANOCOBALAMIN) 1000 MCG tablet Take 1,000 mcg by mouth daily.     Vitamin D, Ergocalciferol, (DRISDOL) 1.25 MG (50000 UT) CAPS capsule Take 50,000 Units by mouth every 7 (seven) days.     ALPRAZolam (XANAX) 0.5 MG tablet Take 0.5 mg by mouth at bedtime as needed for anxiety. (Patient not taking: Reported on 02/01/2022)     apixaban (ELIQUIS) 5 MG TABS tablet Take 1 tablet (5 mg total) by mouth 2 (two) times daily. 180 tablet 3   pantoprazole (PROTONIX) 20 MG tablet Take 1 tablet (20 mg total) by mouth daily for 14 days. 14 tablet 0   No current facility-administered medications for this visit.    OBJECTIVE: Vitals:   02/03/22 1001  BP: (!) 124/91  Pulse: 61  Temp: 97.8 F (36.6 C)  SpO2: 94%     Body mass index is 38.08 kg/m.    ECOG FS:0 - Asymptomatic  General: Well-developed, well-nourished, no acute distress. Eyes: Pink conjunctiva, anicteric sclera. HEENT: Normocephalic, moist mucous membranes. Lungs: No audible wheezing or coughing. Heart: Regular rate and rhythm. Abdomen: Soft, nontender, no obvious distention. Musculoskeletal: No edema, cyanosis, or clubbing. Neuro: Alert, answering all  questions appropriately. Cranial nerves grossly intact. Skin: No rashes or petechiae noted. Psych: Normal affect.  LAB RESULTS:  Lab Results  Component Value Date   NA 138 11/03/2021   K 4.0 11/03/2021   CL 110 11/03/2021   CO2 19 (L) 11/03/2021   GLUCOSE 84 11/03/2021   BUN 24 (H) 11/03/2021   CREATININE 1.63 (H) 11/03/2021   CALCIUM 9.0 11/03/2021   PROT 6.4 (L) 07/18/2019   ALBUMIN 3.7 07/18/2019   AST 29 07/18/2019   ALT 35 07/18/2019   ALKPHOS 51 07/18/2019   BILITOT 0.8 07/18/2019   GFRNONAA 43 (L) 11/03/2021   GFRAA 58 (L) 07/18/2019    Lab Results  Component Value Date   WBC 8.2 01/05/2022   NEUTROABS 5.4 01/05/2022   HGB 16.7 01/05/2022   HCT 51.7 01/05/2022   MCV 92.0 01/05/2022   PLT 197 01/05/2022     STUDIES: CT Chest Wo Contrast  Result Date: 02/01/2022 CLINICAL DATA:  81 year old male presents for evaluation of non-small cell  lung cancer, surveillance imaging. * Tracking Code: BO * EXAM: CT CHEST WITHOUT CONTRAST TECHNIQUE: Multidetector CT imaging of the chest was performed following the standard protocol without IV contrast. RADIATION DOSE REDUCTION: This exam was performed according to the departmental dose-optimization program which includes automated exposure control, adjustment of the mA and/or kV according to patient size and/or use of iterative reconstruction technique. COMPARISON:  Imaging from July 30, 2021. FINDINGS: Cardiovascular: Calcified aortic atherosclerosis. Three-vessel coronary artery disease. Normal heart size. No pericardial effusion. No aortic dilation. Normal caliber central pulmonary vessels. Limited assessment of cardiovascular structures given lack of intravenous contrast. Mediastinum/Nodes: No signs of adenopathy in the chest. Esophagus grossly normal. Distortion of LEFT hilum secondary to post treatment changes similar to previous imaging, see below. Subcarinal lymph nodes which are un enlarged are the largest nodes in the  mediastinum at 9 mm (image 85/2) unchanged compared to previous imaging. Lungs/Pleura: Bandlike pleural and parenchymal scarring extending from the LEFT hilum compatible with post treatment changes extending into the LEFT lung apex. These are unchanged. No pleural effusion. No new pulmonary nodule or mass. Airways are patent. Signs of pulmonary emphysema as before moderate to marked and worse at the lung apices. Upper Abdomen: Incidental imaging of upper abdominal contents without acute process to the extent evaluated. Musculoskeletal: No acute bone finding. No destructive bone process. Spinal degenerative changes. IMPRESSION: 1. Post treatment changes in the LEFT chest without evidence of recurrent or metastatic disease. 2. 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 3. Three-vessel coronary artery disease. 4. Aortic atherosclerosis. Aortic Atherosclerosis (ICD10-I70.0) and Emphysema (ICD10-J43.9). Electronically Signed   By: Zetta Bills M.D.   On: 02/01/2022 15:53     ASSESSMENT: Clinical stage IIB squamous cell carcinoma, left upper lobe lung.  PLAN:    1.Clinical stage IIB squamous cell carcinoma, left upper lobe lung: PET scan results from October 08, 2018 reviewed independently.  Patient underwent biopsy with navigational bronchoscopy on October 19, 2018 confirming diagnosis.  Patient declined surgery and wished to pursue XRT along with concurrent chemotherapy.  Given his stage of disease he did not receive maintenance immunotherapy.  He completed cycle 7 of weekly carboplatinum and Taxol on January 09, 2019 and then completed XRT on January 14, 2019.  CT scan results from February 01, 2022 reviewed independently and reported as above with no obvious evidence of recurrent or progressive disease.  Patient is now 3 years removed from his treatment and can be transition to yearly imaging and evaluation.  Return to clinic in 1 year with repeat CT scan and further evaluation.   2.  History of melanoma: Unclear  stage or depth, although by report was in situ.  Patient had Mohs surgery in fall 2019.  Lung biopsy consistent with squamous cell carcinoma. 3.  Anxiety/depression: Improved.  Continue current medications as prescribed.  Continue follow-up with primary care physician as well as psychiatry at the [place]. 4.  Renal insufficiency: Chronic and unchanged.  Patient's most recent creatinine is 1.63.  Continue monitoring and treatment per primary care at the [facility]. 5.  Dyspnea on exertion: Patient does not complain of this today.  Patient expressed understanding and was in agreement with this plan. He also understands that He can call clinic at any time with any questions, concerns, or complaints.    Cancer Staging  Squamous cell carcinoma lung, left ([place]) Staging form: Lung, AJCC 8th Edition - Clinical stage from 10/27/2018: Stage IIB (cT1c, cN1, cM0) - Signed by Lloyd Huger, MD on 11/16/2018  Lloyd Huger, MD   02/03/2022 4:44 PM

## 2022-02-01 NOTE — Patient Instructions (Signed)
Today, we discussed your breathing. I have ordered an inhaler (Spiriva Respimat) to be used once daily. I also ordered a pulmonary function test to assess your breathing. I will see you in 3 months for follow up.

## 2022-02-01 NOTE — Progress Notes (Signed)
Synopsis: Referred in for shortness of breath by [place]:   1. Pulmonary emphysema, unspecified emphysema type [facility])  Pleasant 81 year old male presenting with shortness of breath with a history of COPD/Emphysema diagnosed at the [place]. My differential for his worsening shortness of breath includes deconditioning given his sedentary lifestyle, decreased chest wall compliance given weight gain, and worsening COPD. I will obtain a PFT (spirometry, lung volumes, DLCO) to obtain a baseline, and will augment his current therapy (Advair) with a LAMA (Tiotropium, respimat). I did counsel him today about the possible side effects of tiotropium which could include urinary retention, especially given his BPH. They understand the risk associated with it and will monitor for any change in symptoms, stop the medication and/or present to the hospital should he experience worsening retention. I also counseled them on the importance of weight loss for overall health but also to alleviate some of his shortness of breath.  - Pulmonary Function Test [facility] Only; Future - Tiotropium Bromide Monohydrate (SPIRIVA RESPIMAT) 2.5 MCG/ACT AERS; Inhale 2 puffs into the lungs daily.  Dispense: 1 each; Refill: 11   Return in about 3 months (around 05/04/2022).  I spent 60 minutes caring for this patient today, including preparing to see the patient, obtaining and/or reviewing separately obtained history, performing a medically appropriate examination and/or evaluation, counseling and educating the patient/family/caregiver, ordering medications, tests, or procedures, and documenting clinical information in the electronic health record  Armando Reichert, MD [facility] 02/01/2022 1:17 PM    End of visit medications:  Meds ordered this encounter  Medications   Tiotropium Bromide Monohydrate (SPIRIVA RESPIMAT) 2.5 MCG/ACT AERS    Sig: Inhale 2 puffs into the lungs daily.     Dispense:  1 each    Refill:  11     Current Outpatient Medications:    albuterol (PROVENTIL HFA;VENTOLIN HFA) 108 (90 Base) MCG/ACT inhaler, Inhale 2 puffs into the lungs every 6 (six) hours as needed for wheezing or shortness of breath., Disp: 1 Inhaler, Rfl: 0   carboxymethylcellulose (REFRESH PLUS) 0.5 % SOLN, 1 drop 4 (four) times daily as needed., Disp: , Rfl:    finasteride (PROSCAR) 5 MG tablet, Take 5 mg by mouth daily., Disp: , Rfl:    fluticasone-salmeterol (ADVAIR) 250-50 MCG/ACT AEPB, Inhale 1 puff into the lungs in the morning and at bedtime., Disp: , Rfl:    glimepiride (AMARYL) 2 MG tablet, Take 1 tablet (2 mg total) by mouth every morning., Disp: 30 tablet, Rfl: 0   levothyroxine (SYNTHROID) 200 MCG tablet, Take 200 mcg by mouth daily before breakfast., Disp: , Rfl:    SEMAGLUTIDE,0.25 OR 0.5MG /DOS, [place], Inject 0.25 mg into the skin once a week., Disp: , Rfl:    tamsulosin (FLOMAX) 0.4 MG CAPS capsule, 0.8 mg daily., Disp: , Rfl:    Tiotropium Bromide Monohydrate (SPIRIVA RESPIMAT) 2.5 MCG/ACT AERS, Inhale 2 puffs into the lungs daily., Disp: 1 each, Rfl: 11   vitamin B-12 (CYANOCOBALAMIN) 1000 MCG tablet, Take 1,000 mcg by mouth daily., Disp: , Rfl:    Vitamin D, Ergocalciferol, (DRISDOL) 1.25 MG (50000 UT) CAPS capsule, Take 50,000 Units by mouth every 7 (seven) days., Disp: , Rfl:    ALPRAZolam (XANAX) 0.5 MG tablet, Take 0.5 mg by mouth at bedtime as needed for anxiety. (Patient not taking: Reported on 02/01/2022), Disp: , Rfl:    apixaban (ELIQUIS) 5 MG TABS tablet, Take 1 tablet (5 mg total) by mouth 2 (  two) times daily., Disp: 180 tablet, Rfl: 3   pantoprazole (PROTONIX) 20 MG tablet, Take 1 tablet (20 mg total) by mouth daily for 14 days., Disp: 14 tablet, Rfl: 0   Subjective:   PATIENT ID: Levi [name] GENDER: male DOB: 10/12/1940, MRN: [id number]  Chief Complaint  Patient presents with   pulmonary consult    CT 01/31/2022-SOB with exertion, prod cough with clear  sputum and occ wheezing.     HPI  Levi [name] is a pleasant 81 year old male presenting to clinic for the evaluation of shortness of breath. He is accompanied by his wife.  They tell me that his symptoms have been chronic, but worsened recently over the past 2 years as he's gained weight. He was seen in clinic by his PCP at the [place] and has been maintained on Advair. He carries a diagnosis of COPD, and reports having had pulmonary function testing at the [place]. I do not have the results of his PFT's. He reports some improvement with his Advair, but not much. Similarly, he uses his Albuterol twice to thrice a day, with some improvement. He reports an occasional cough, mostly in the morning. He denies any wheezing, chest pain, or chest tightness. His cough is productive of whitish sputum, and he denies a change in the nature of the sputum, and denies any hemoptysis.  They report that he has gained a good amount of weight (60 lbs) that he has been slowly loosing. He's lost around 6 pounds over the past two months. His wife reports a sedentary lifestyle. He quit smoking around the time of his diagnosis with lung cancer. At that time, he underwent navigational bronchoscopy with biopsies. 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 He reports some lower extremity edema, worse at the end of the day. He reports having to go up every 2 hours at night to pee. He is taking tamsulosin for BPH. He does not have any vision problems. He is hard of hearing. He has a history of Afib for which he is followed by cardiology. He feels that some of his symptoms are due to his afib.  He did smoke for a long time, with at least 50 to 60 pack years on him. 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 He is currently  retired.  Ancillary information including prior medications, full medical/surgical/family/social histories, and PFTs (when available) are listed below and have been reviewed.   Review of Systems  Constitutional:  Negative for chills and fever.  HENT:  Positive for hearing loss. Negative for ear pain and tinnitus.   Respiratory:  Positive for cough and shortness of breath. Negative for hemoptysis.   Cardiovascular:  Negative for chest pain.     Objective:   Vitals:   02/01/22 1320  BP: 120/72  Pulse: 79  Temp: 98.3 F (36.8 C)  TempSrc: Temporal  SpO2: 92%  Weight: 272 lb 3.2 oz (123.5 kg)  Height: 5\' 11"  (1.803 m)   92% on RA  BMI Readings from Last 3 Encounters:  12/30/21 37.99 kg/m  12/10/21 37.99 kg/m  11/24/21 37.52 kg/m   Wt Readings from Last 3 Encounters:  12/30/21 272 lb 6 oz (123.5 kg)  12/10/21 272 lb 6.4 oz (123.6 kg)  11/24/21 269 lb (122 kg)  Physical Exam Constitutional:      Appearance: He is obese.  HENT:     Head: Normocephalic.     Nose: Nose normal.     Mouth/Throat:     Mouth: Mucous membranes are moist.  Eyes:     Extraocular Movements: Extraocular movements intact.  Cardiovascular:     Rate and Rhythm: Normal rate. Rhythm irregular.     Heart sounds: Normal heart sounds.  Pulmonary:     Effort: Pulmonary effort is normal.     Breath sounds: No wheezing.     Comments: Distant breath sounds Abdominal:     General: There is distension.     Palpations: Abdomen is soft.  Neurological:     General: No focal deficit present.     Mental Status: He is alert and oriented to person, place, and time. Mental status is at baseline.       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[place])    Prostate enlargement    Shortness of breath    Squamous  cell carcinoma of lung, left (HCC)    Thyroid disease    Tobacco abuse      Family History  Problem Relation Age of Onset   Aneurysm Mother    Cancer Father      Past Surgical History:  Procedure Laterality Date   CARDIOVERSION N/A    CARDIOVERSION N/A 11/24/2021   Procedure: CARDIOVERSION;  Surgeon: [name], MD;  Location: [facility];  Service: Cardiovascular;  Laterality: N/A;   ELECTROMAGNETIC NAVIGATION BROCHOSCOPY Left 10/19/2018   Procedure: ELECTROMAGNETIC NAVIGATION BRONCHOSCOPY LEFT;  Surgeon: Tyler Pita, MD;  Location: [facility];  Service: Cardiopulmonary;  Laterality: Left;   ENDOBRONCHIAL ULTRASOUND Left 10/19/2018   Procedure: ENDOBRONCHIAL ULTRASOUND LEFT;  Surgeon: Tyler Pita, MD;  Location: [facility];  Service: Cardiopulmonary;  Laterality: Left;   MELANOMA EXCISION Left    NO PAST SURGERIES      Social History   Socioeconomic History   Marital status: Married    Spouse name: [name]   Number of children: 5   Years of education: Not on file   Highest education level: Not on file  Occupational History   Not on file  Tobacco Use   Smoking status: Former    Packs/day: 0.25    Years: 62.00    Total pack years: 15.50    Types: Cigarettes    Quit date: 09/05/2018    Years since quitting: 3.4   Smokeless tobacco: Never  Vaping Use   Vaping Use: Never used  Substance and Sexual Activity   Alcohol use: No    Alcohol/week: 0.0 standard drinks of alcohol   Drug use: No   Sexual activity: Not on file  Other Topics Concern   Not on file  Social History Narrative   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 He is married to his wife of 30+ years, [name] and has 5 children (1 deceased), many grand children, and one great grand child.    Social Determinants of Health   Financial  Resource Strain: Low  Risk  (12/04/2018)   Overall Financial Resource Strain (CARDIA)    Difficulty of Paying Living Expenses: Not very hard  Food Insecurity: No Food Insecurity (12/04/2018)   Hunger Vital Sign    Worried About Running Out of Food in the Last Year: Never true    Ran Out of Food in the Last Year: Never true  Transportation Needs: No Transportation Needs (12/04/2018)   PRAPARE - Hydrologist (Medical): No    Lack of Transportation (Non-Medical): No  Physical Activity: Not on file  Stress: Stress Concern Present (12/04/2018)   [place]    Feeling of Stress : Very much  Social Connections: Unknown (12/04/2018)   Social Connection and Isolation Panel [NHANES]    Frequency of Communication with Friends and Family: More than three times a week    Frequency of Social Gatherings with Friends and Family: Once a week    Attends Religious Services: Not on file    Active Member of Clubs or Organizations: Not on file    Attends Archivist Meetings: Not on file    Marital Status: Not on file  Intimate Partner Violence: Not on file     No Known Allergies   CBC    Component Value Date/Time   WBC 8.2 01/05/2022 1204   RBC 5.62 01/05/2022 1204   HGB 16.7 01/05/2022 1204   HCT 51.7 01/05/2022 1204   PLT 197 01/05/2022 1204   MCV 92.0 01/05/2022 1204   MCV 90.0 10/20/2015 1020   MCH 29.7 01/05/2022 1204   MCHC 32.3 01/05/2022 1204   RDW 14.3 01/05/2022 1204   LYMPHSABS 1.5 01/05/2022 1204   MONOABS 0.8 01/05/2022 1204   EOSABS 0.4 01/05/2022 1204   BASOSABS 0.1 01/05/2022 1204    Pulmonary Functions Testing Results:     No data to display          Outpatient Medications Prior to Visit  Medication Sig Dispense Refill   albuterol (PROVENTIL HFA;VENTOLIN HFA) 108 (90 Base) MCG/ACT inhaler Inhale 2 puffs into the lungs every 6 (six) hours as needed for wheezing or shortness  of breath. 1 Inhaler 0   carboxymethylcellulose (REFRESH PLUS) 0.5 % SOLN 1 drop 4 (four) times daily as needed.     finasteride (PROSCAR) 5 MG tablet Take 5 mg by mouth daily.     fluticasone-salmeterol (ADVAIR) 250-50 MCG/ACT AEPB Inhale 1 puff into the lungs in the morning and at bedtime.     glimepiride (AMARYL) 2 MG tablet Take 1 tablet (2 mg total) by mouth every morning. 30 tablet 0   levothyroxine (SYNTHROID) 200 MCG tablet Take 200 mcg by mouth daily before breakfast.     SEMAGLUTIDE,0.25 OR 0.5MG /DOS, [place] Inject 0.25 mg into the skin once a week.     tamsulosin (FLOMAX) 0.4 MG CAPS capsule 0.8 mg daily.     vitamin B-12 (CYANOCOBALAMIN) 1000 MCG tablet Take 1,000 mcg by mouth daily.     Vitamin D, Ergocalciferol, (DRISDOL) 1.25 MG (50000 UT) CAPS capsule Take 50,000 Units by mouth every 7 (seven) days.     levothyroxine (SYNTHROID) 137 MCG tablet Take 200 mcg by mouth daily before breakfast.     ALPRAZolam (XANAX) 0.5 MG tablet Take 0.5 mg by mouth at bedtime as needed for anxiety. (Patient not taking: Reported on 02/01/2022)     apixaban (ELIQUIS) 5 MG TABS tablet Take 1 tablet (5 mg total) by mouth 2 (two) times  daily. 180 tablet 3   pantoprazole (PROTONIX) 20 MG tablet Take 1 tablet (20 mg total) by mouth daily for 14 days. 14 tablet 0   budesonide-formoterol (SYMBICORT) 160-4.5 MCG/ACT inhaler Inhale 2 puffs into the lungs 2 (two) times daily. (Patient not taking: Reported on 02/01/2022)     No facility-administered medications prior to visit.

## 2022-02-03 ENCOUNTER — Inpatient Hospital Stay: Payer: No Typology Code available for payment source | Attending: Oncology | Admitting: Oncology

## 2022-02-03 ENCOUNTER — Encounter: Payer: Self-pay | Admitting: Oncology

## 2022-02-03 VITALS — BP 124/91 | HR 61 | Temp 97.8°F | Ht 71.0 in | Wt 273.0 lb

## 2022-02-03 DIAGNOSIS — Z85118 Personal history of other malignant neoplasm of bronchus and lung: Secondary | ICD-10-CM | POA: Insufficient documentation

## 2022-02-03 DIAGNOSIS — Z9221 Personal history of antineoplastic chemotherapy: Secondary | ICD-10-CM | POA: Insufficient documentation

## 2022-02-03 DIAGNOSIS — Z923 Personal history of irradiation: Secondary | ICD-10-CM | POA: Diagnosis not present

## 2022-02-03 DIAGNOSIS — C3492 Malignant neoplasm of unspecified part of left bronchus or lung: Secondary | ICD-10-CM

## 2022-02-03 DIAGNOSIS — Z85820 Personal history of malignant melanoma of skin: Secondary | ICD-10-CM | POA: Diagnosis not present

## 2022-02-10 ENCOUNTER — Encounter: Payer: Self-pay | Admitting: Internal Medicine

## 2022-02-10 ENCOUNTER — Ambulatory Visit: Payer: No Typology Code available for payment source | Attending: Internal Medicine | Admitting: Internal Medicine

## 2022-02-10 VITALS — BP 118/78 | HR 82 | Ht 71.6 in | Wt 271.4 lb

## 2022-02-10 DIAGNOSIS — I4819 Other persistent atrial fibrillation: Secondary | ICD-10-CM

## 2022-02-10 DIAGNOSIS — Z87891 Personal history of nicotine dependence: Secondary | ICD-10-CM | POA: Insufficient documentation

## 2022-02-10 DIAGNOSIS — I25119 Atherosclerotic heart disease of native coronary artery with unspecified angina pectoris: Secondary | ICD-10-CM

## 2022-02-10 DIAGNOSIS — H9319 Tinnitus, unspecified ear: Secondary | ICD-10-CM | POA: Insufficient documentation

## 2022-02-10 DIAGNOSIS — R072 Precordial pain: Secondary | ICD-10-CM

## 2022-02-10 DIAGNOSIS — Z461 Encounter for fitting and adjustment of hearing aid: Secondary | ICD-10-CM | POA: Insufficient documentation

## 2022-02-10 DIAGNOSIS — E119 Type 2 diabetes mellitus without complications: Secondary | ICD-10-CM | POA: Insufficient documentation

## 2022-02-10 DIAGNOSIS — R0600 Dyspnea, unspecified: Secondary | ICD-10-CM

## 2022-02-10 DIAGNOSIS — I25810 Atherosclerosis of coronary artery bypass graft(s) without angina pectoris: Secondary | ICD-10-CM | POA: Insufficient documentation

## 2022-02-10 DIAGNOSIS — R69 Illness, unspecified: Secondary | ICD-10-CM | POA: Insufficient documentation

## 2022-02-10 DIAGNOSIS — J309 Allergic rhinitis, unspecified: Secondary | ICD-10-CM | POA: Insufficient documentation

## 2022-02-10 DIAGNOSIS — F419 Anxiety disorder, unspecified: Secondary | ICD-10-CM | POA: Insufficient documentation

## 2022-02-10 DIAGNOSIS — H833X9 Noise effects on inner ear, unspecified ear: Secondary | ICD-10-CM | POA: Insufficient documentation

## 2022-02-10 DIAGNOSIS — R55 Syncope and collapse: Secondary | ICD-10-CM | POA: Insufficient documentation

## 2022-02-10 DIAGNOSIS — H6123 Impacted cerumen, bilateral: Secondary | ICD-10-CM | POA: Insufficient documentation

## 2022-02-10 DIAGNOSIS — N400 Enlarged prostate without lower urinary tract symptoms: Secondary | ICD-10-CM | POA: Insufficient documentation

## 2022-02-10 HISTORY — DX: Personal history of nicotine dependence: Z87.891

## 2022-02-10 NOTE — Patient Instructions (Addendum)
Medication Instructions:  - Your physician recommends that you continue on your current medications as directed. Please refer to the Current Medication list given to you today.  *If you need a refill on your cardiac medications before your next appointment, please call your pharmacy*   Lab Work: - none ordered  If you have labs (blood work) drawn today and your tests are completely normal, you will receive your results only by: [place] (if you have MyChart) OR A paper copy in the mail If you have any lab test that is abnormal or we need to change your treatment, we will call you to review the results.   Testing/Procedures:  1) Lexiscan Myoview: (Chemical Stress Test/ Cardiac Nuclear Scan) - Your physician has requested that you have a lexiscan myoview.   [place]  Your caregiver has ordered a Stress Test with nuclear imaging. The purpose of this test is to evaluate the blood supply to your heart muscle. This procedure is referred to as a "Non-Invasive Stress Test." This is because other than having an IV started in your vein, nothing is inserted or "invades" your body. Cardiac stress tests are done to find areas of poor blood flow to the heart by determining the extent of coronary artery disease (CAD). Some patients exercise on a treadmill, which naturally increases the blood flow to your heart, while others who are  unable to walk on a treadmill due to physical limitations have a pharmacologic/chemical stress agent called Lexiscan . This medicine will mimic walking on a treadmill by temporarily increasing your coronary blood flow.   Please note: these test may take anywhere between 2-4 hours to complete  PLEASE REPORT TO [facility] AT THE FIRST DESK WILL DIRECT YOU WHERE TO GO  Date of Procedure:_____________________________________  Arrival Time for Procedure:______________________________  Instructions regarding medication:   __xx__ :  Hold diabetes medication morning of procedure (GLIMEPIRIDE)  _xx___: You may take all of your other regular morning medications the day of your procedure with enough water to get them down safely   PLEASE NOTIFY THE OFFICE AT LEAST 24 HOURS IN ADVANCE IF YOU ARE UNABLE TO [place].  [phone] AND  PLEASE NOTIFY NUCLEAR MEDICINE AT [facility] AT LEAST 24 HOURS IN ADVANCE IF YOU ARE UNABLE TO KEEP YOUR APPOINTMENT. [phone]  How to prepare for your Myoview test:  Do not eat or drink after midnight No caffeine for 24 hours prior to test No smoking 24 hours prior to test. Your medication may be taken with water.  If your doctor stopped a medication because of this test, do not take that medication. Ladies, please do not wear dresses.  Skirts or pants are appropriate. Please wear a short sleeve shirt. No perfume, cologne or lotion. Wear comfortable walking shoes. No heels!    Follow-Up: At [facility], you and your health needs are our priority.  As part of our continuing mission to provide you with exceptional heart care, we have created designated Provider Care Teams.  These Care Teams include your primary Cardiologist (physician) and Advanced Practice Providers (APPs -  Physician Assistants and Nurse Practitioners) who all work together to provide you with the care you need, when you need it.  We recommend signing up for the patient portal called "MyChart".  Sign up information is provided on this After Visit Summary.  MyChart is used to connect with patients for Virtual Visits (Telemedicine).  Patients are able to view lab/test results,  encounter notes, upcoming appointments, etc.  Non-urgent messages can be sent to your provider as well.   To learn more about what you can do with MyChart, go to [email or web address].    Your next appointment:   6 month(s)  The format for your next appointment:   In Person  Provider:   Virl Axe, MD    Other  Instructions  Cardiac Nuclear Scan A cardiac nuclear scan is a test that is done to check the flow of blood to your heart. It is done when you are resting and when you are exercising. The test looks for problems such as: Not enough blood reaching a portion of the heart. The heart muscle not working as it should. You may need this test if: You have heart disease. You have had lab results that are not normal. You have had heart surgery or a balloon procedure to open up blocked arteries (angioplasty). You have chest pain. You have shortness of breath. In this test, a special dye (tracer) is put into your bloodstream. The tracer will travel to your heart. A camera will then take pictures of your heart to see how the tracer moves through your heart. This test is usually done at a hospital and takes 2-4 hours. Tell a doctor about: Any allergies you have. All medicines you are taking, including vitamins, herbs, eye drops, creams, and over-the-counter medicines. Any problems you or family members have had with anesthetic medicines. Any blood disorders you have. Any surgeries you have had. Any medical conditions you have. Whether you are pregnant or may be pregnant. What are the risks? Generally, this is a safe test. However, problems may occur, such as: Serious chest pain and heart attack. This is only a risk if the stress portion of the test is done. Rapid heartbeat. A feeling of warmth in your chest. This feeling usually does not last long. Allergic reaction to the tracer. What happens before the test? Ask your doctor about changing or stopping your normal medicines. This is important. Follow instructions from your doctor about what you cannot eat or drink. Remove your jewelry on the day of the test. What happens during the test? An IV tube will be inserted into one of your veins. Your doctor will give you a small amount of tracer through the IV tube. You will wait for 20-40 minutes while  the tracer moves through your bloodstream. Your heart will be monitored with an electrocardiogram (ECG). You will lie down on an exam table. Pictures of your heart will be taken for about 15-20 minutes. You may also have a stress test. For this test, one of these things may be done: You will be asked to exercise on a treadmill or a stationary bike. You will be given medicines that will make your heart work harder. This is done if you are unable to exercise. When blood flow to your heart has peaked, a tracer will again be given through the IV tube. After 20-40 minutes, you will get back on the exam table. More pictures will be taken of your heart. Depending on the tracer that is used, more pictures may need to be taken 3-4 hours later. Your IV tube will be removed when the test is over. The test may vary among doctors and hospitals. What happens after the test? Ask your doctor: Whether you can return to your normal schedule, including diet, activities, and medicines. Whether you should drink more fluids. This will help to remove the tracer from your  body. Drink enough fluid to keep your pee (urine) pale yellow. Ask your doctor, or the department that is doing the test: When will my results be ready? How will I get my results? Summary A cardiac nuclear scan is a test that is done to check the flow of blood to your heart. Tell your doctor whether you are pregnant or may be pregnant. Before the test, ask your doctor about changing or stopping your normal medicines. This is important. Ask your doctor whether you can return to your normal activities. You may be asked to drink more fluids. This information is not intended to replace advice given to you by your health care provider. Make sure you discuss any questions you have with your health care provider. Document Revised: 06/29/2021 Document Reviewed: 09/30/2020 Elsevier Patient Education  [place]

## 2022-02-10 NOTE — Progress Notes (Signed)
Patient Care Team: [place] as PCP - General (General Practice) Kate Sable, MD as PCP - Cardiology (Cardiology) Vickie Epley, MD as PCP - Electrophysiology (Cardiology) Telford Nab, RN as Registered Nurse Grayland Ormond, Kathlene November, MD as Consulting Physician (Oncology) Noreene Filbert, MD as Referring Physician (Radiation Oncology) Nestor Lewandowsky, MD (Inactive) as Referring Physician (Cardiothoracic Surgery)   HPI  Levi [name] is a 81 y.o. male seen in followup for atrial fibrillation.  Initially diagnosed 2020 contextually with syncope.  At that time he had been just diagnosed with squamous cell lung cancer for which he has been undergoing chemotherapy and radiation therapy in the context of known COPD.  ECG surveillance had demonstrated sinus rhythm as late as 11/22, importantly because his dyspnea precedes the recurrence of atrial fibrillation  He had met with Dr. Daune Perch regarding treatment options which she felt were limited to dronaderone, cardioversion which failed to hold sinus rhythm.  With its discontinuation he has felt much better.  He feels like he is having less A-fib.  Breathing is better.  The Myoview anticipated has not been consummated.  He is seeing pulmonary.  CT scan is as noted below.   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[place])    Prostate enlargement    Shortness of breath    Squamous cell carcinoma of lung, left ([place])    Thyroid disease    Tobacco abuse     Past Surgical History:  Procedure Laterality Date   CARDIOVERSION N/A    CARDIOVERSION N/A 11/24/2021    Procedure: CARDIOVERSION;  Surgeon: [name], MD;  Location: [facility] ORS;  Service: Cardiovascular;  Laterality: N/A;   ELECTROMAGNETIC NAVIGATION BROCHOSCOPY Left 10/19/2018   Procedure: ELECTROMAGNETIC NAVIGATION BRONCHOSCOPY LEFT;  Surgeon: Tyler Pita, MD;  Location: [facility] ORS;  Service: Cardiopulmonary;  Laterality: Left;   ENDOBRONCHIAL ULTRASOUND Left 10/19/2018   Procedure: ENDOBRONCHIAL ULTRASOUND LEFT;  Surgeon: Tyler Pita, MD;  Location: [facility] ORS;  Service: Cardiopulmonary;  Laterality: Left;   MELANOMA EXCISION Left    NO PAST SURGERIES      Current Meds  Medication Sig   albuterol (PROVENTIL HFA;VENTOLIN HFA) 108 (90 Base) MCG/ACT inhaler Inhale 2 puffs into the lungs every 6 (six) hours as needed for wheezing or shortness of breath.   apixaban (ELIQUIS) 5 MG TABS tablet Take 5 mg by mouth 2 (two) times daily.   carboxymethylcellulose (REFRESH PLUS) 0.5 % SOLN 1 drop 4 (four) times daily as needed.   finasteride (PROSCAR) 5 MG tablet Take 5 mg by mouth daily.   fluticasone-salmeterol (ADVAIR) 250-50 MCG/ACT AEPB Inhale 1 puff into the lungs in the morning and at bedtime.   glimepiride (AMARYL) 2 MG tablet Take 1 tablet (2 mg total) by mouth every morning.   levothyroxine (SYNTHROID) 200 MCG tablet Take 200 mcg by mouth daily before breakfast.  SEMAGLUTIDE,0.25 OR 0.5MG/DOS, [place] Inject 0.25 mg into the skin once a week.   tamsulosin (FLOMAX) 0.4 MG CAPS capsule 0.8 mg daily.   Tiotropium Bromide Monohydrate (SPIRIVA RESPIMAT) 2.5 MCG/ACT AERS Inhale 2 puffs into the lungs daily.   vitamin B-12 (CYANOCOBALAMIN) 1000 MCG tablet Take 1,000 mcg by mouth daily.   [DISCONTINUED] Vitamin D, Ergocalciferol, (DRISDOL) 1.25 MG (50000 UT) CAPS capsule Take 50,000 Units by mouth every 7 (seven) days.    No Known Allergies    Review of Systems negative except from HPI and PMH  Physical Exam BP 118/78   Pulse 82   Ht 5' 11.6" (1.819 m)   Wt 271 lb 6.4 oz (123.1 kg)    SpO2 95%   BMI 37.22 kg/m  Well developed and well nourished in no acute distress HENT normal E scleral and icterus clear Neck Supple JVP flat; carotids brisk and full Clear to ausculation Irregularly Irregular rate and rhythm with controlled  ventricular response Regular rate and rhythm, no murmurs gallops or rub Soft with active bowel sounds No clubbing cyanosis  Edema Alert and oriented, grossly normal motor and sensory function Skin Warm and Dry  ECG atrial fibrillation at a rate of 82 1-/09/37 Incomplete right bundle with left axis  CrCl cannot be calculated (Patient's most recent lab result is older than the maximum 21 days allowed.).   Assessment and  Plan  Atrial fibrillation-permanent   Dyspnea on exertion   COPD   Lung cancer status post chemo/radiation therapy   Syncope   No insufficiency grade 3B   Coronary artery disease   Polycythemia    Feeling better with atrial fibrillation.  We will not pursue efforts to restore sinus rhythm at this time.  Dyspnea is also better.  Follow-up with pulmonary.  With his history of coronary disease we will rule out ischemia with a Myoview scan.  No bleeding on Eliquis.  Continue MADIT V twice daily.     Current medicines are reviewed at length with the patient today .  The patient does not  have concerns regarding medicines.

## 2022-02-15 ENCOUNTER — Encounter
Admission: RE | Admit: 2022-02-15 | Discharge: 2022-02-15 | Disposition: A | Discharge status: Home / Self Care | Payer: No Typology Code available for payment source | Source: Ambulatory Visit | Attending: Internal Medicine | Admitting: Internal Medicine

## 2022-02-15 DIAGNOSIS — R072 Precordial pain: Secondary | ICD-10-CM | POA: Diagnosis present

## 2022-02-15 MED ORDER — REGADENOSON 0.4 MG/5ML IV SOLN
0.4000 mg | Freq: Once | INTRAVENOUS | Status: AC
  Administered 2022-02-15: 0.4 mg via INTRAVENOUS

## 2022-02-15 MED ORDER — TECHNETIUM TC 99M TETROFOSMIN IV KIT
32.3500 | PACK | Freq: Once | INTRAVENOUS | Status: AC | PRN
  Administered 2022-02-15: 32.35 via INTRAVENOUS

## 2022-02-15 MED ORDER — TECHNETIUM TC 99M TETROFOSMIN IV KIT
10.6700 | PACK | Freq: Once | INTRAVENOUS | Status: AC | PRN
  Administered 2022-02-15: 10.67 via INTRAVENOUS

## 2022-02-16 LAB — NM MYOCAR MULTI W/SPECT W/WALL MOTION / EF
LV dias vol: 107 mL (ref 62–150)
LV sys vol: 39 mL
Nuc Stress EF: 64 %
Peak HR: 88 {beats}/min
Rest HR: 68 {beats}/min
Rest Nuclear Isotope Dose: 10.7 mCi
SDS: 1
SRS: 2
SSS: 2
ST Depression (mm): 0 mm
Stress Nuclear Isotope Dose: 32.4 mCi
TID: 0.96

## 2022-03-01 ENCOUNTER — Telehealth: Payer: Self-pay | Admitting: Internal Medicine

## 2022-03-01 NOTE — Telephone Encounter (Signed)
Levi Sprang, MD  02/27/2022  2:57 PM EDT     Please Inform Patient that stress test showed no evidence of ischemia,  normal heart muscle function    Thanks

## 2022-03-01 NOTE — Telephone Encounter (Signed)
I called and spoke with Mrs. Degollado regarding the [employer] results (ok per DPR).  She said she had gotten a call from someone that Dr. Caryl Comes was turning the patient's care over to the [place] as there was nothing else he could do for him.   I advised Mrs. Iyengar that I was not aware of that and she was not really sure who called her.   I advised her that per Dr. Olin Pia last office note from 02/10/22: Feeling better with atrial fibrillation.  We will not pursue efforts to restore sinus rhythm at this time.  Dyspnea is also better.  Follow-up with pulmonary.   With his history of coronary disease we will rule out ischemia with a Myoview scan  I inquired how the patient is currently feeling and per Mrs. Savino, the patient has intermittent episodes, multiple times a day of dizziness and SOB that occur simultaneously.  Symptoms are not dependent on change in position/ activity.  She thought Dr. Caryl Comes had mentioned possibly trying a drug for his a-fib. I advised her I will need to follow up with Dr. Caryl Comes in this regard as I do not see any mention of a medication from the office note.   Mrs. Bilton also advised the patient is pending a possible breathing test with Pulmonology as well.  I have advised her I will review the above with Dr. Caryl Comes and call her back.  She voices understanding and is agreeable.

## 2022-03-03 NOTE — Telephone Encounter (Signed)
The discussion in the office had been that he was much better off the dronaderone, and that his symptoms were not clearly related to atrial fibrillation given the fact that they had existed sometime ago when he was clearly in sinus rhythm.  If we want to try medication, we could try dofetilide at 125 mg.  And I am glad to do that.  Also, the message should not have come from [place] about his care with the [place].  As we are glad to continue to work with him

## 2022-03-10 NOTE — Telephone Encounter (Signed)
A mychart message was sent to the patient/ his wife with Dr. Olin Pia comments as stated below.  I did offer a virtual visit if needed to discuss his options further.  I advised that Tikosyn will require a 72 hospitalization to initiate.   I also sent some education to them about the drug as well.  I asked that they keep me posted as to how they would like to proceed.

## 2022-04-22 ENCOUNTER — Ambulatory Visit
Payer: No Typology Code available for payment source | Attending: Student in an Organized Health Care Education/Training Program

## 2022-04-22 DIAGNOSIS — Z87891 Personal history of nicotine dependence: Secondary | ICD-10-CM | POA: Insufficient documentation

## 2022-04-22 DIAGNOSIS — J439 Emphysema, unspecified: Secondary | ICD-10-CM | POA: Insufficient documentation

## 2022-04-22 DIAGNOSIS — Z79899 Other long term (current) drug therapy: Secondary | ICD-10-CM | POA: Diagnosis not present

## 2022-04-22 DIAGNOSIS — R062 Wheezing: Secondary | ICD-10-CM | POA: Insufficient documentation

## 2022-04-22 LAB — PULMONARY FUNCTION TEST ARMC ONLY
DL/VA % pred: 47 %
DL/VA: 1.85 ml/min/mmHg/L
DLCO unc % pred: 42 %
DLCO unc: 11.02 ml/min/mmHg
FEF 25-75 Post: 2.01 L/sec
FEF 25-75 Pre: 1.2 L/sec
FEF2575-%Change-Post: 67 %
FEF2575-%Pred-Post: 90 %
FEF2575-%Pred-Pre: 54 %
FEV1-%Change-Post: 21 %
FEV1-%Pred-Post: 84 %
FEV1-%Pred-Pre: 69 %
FEV1-Post: 2.66 L
FEV1-Pre: 2.2 L
FEV1FVC-%Change-Post: 9 %
FEV1FVC-%Pred-Pre: 81 %
FEV6-%Change-Post: 12 %
FEV6-%Pred-Post: 98 %
FEV6-%Pred-Pre: 87 %
FEV6-Post: 4.08 L
FEV6-Pre: 3.64 L
FEV6FVC-%Change-Post: 0 %
FEV6FVC-%Pred-Post: 104 %
FEV6FVC-%Pred-Pre: 104 %
FVC-%Change-Post: 10 %
FVC-%Pred-Post: 93 %
FVC-%Pred-Pre: 85 %
FVC-Post: 4.15 L
FVC-Pre: 3.75 L
Post FEV1/FVC ratio: 64 %
Post FEV6/FVC ratio: 98 %
Pre FEV1/FVC ratio: 59 %
Pre FEV6/FVC Ratio: 98 %
RV % pred: 78 %
RV: 2.14 L
TLC % pred: 84 %
TLC: 6.33 L

## 2022-04-22 MED ORDER — ALBUTEROL SULFATE (2.5 MG/3ML) 0.083% IN NEBU
2.5000 mg | INHALATION_SOLUTION | Freq: Once | RESPIRATORY_TRACT | Status: AC
  Administered 2022-04-22: 2.5 mg via RESPIRATORY_TRACT
  Filled 2022-04-22: qty 3

## 2022-05-12 ENCOUNTER — Encounter: Payer: Self-pay | Admitting: Student in an Organized Health Care Education/Training Program

## 2022-05-12 ENCOUNTER — Telehealth: Payer: Self-pay | Admitting: Internal Medicine

## 2022-05-12 ENCOUNTER — Ambulatory Visit (INDEPENDENT_AMBULATORY_CARE_PROVIDER_SITE_OTHER)
Payer: No Typology Code available for payment source | Admitting: Student in an Organized Health Care Education/Training Program

## 2022-05-12 VITALS — BP 128/72 | HR 67 | Temp 97.5°F | Ht 71.6 in | Wt 270.2 lb

## 2022-05-12 DIAGNOSIS — J439 Emphysema, unspecified: Secondary | ICD-10-CM | POA: Diagnosis not present

## 2022-05-12 NOTE — Progress Notes (Signed)
Synopsis: Follow up for shortness of breath.  Assessment & Plan:   1. Pulmonary emphysema, unspecified emphysema type [facility])  Pleasant 82 year old male presenting with shortness of breath with a history of COPD/Emphysema diagnosed at the [place]. My differential for his worsening shortness of breath includes deconditioning given his sedentary lifestyle, decreased chest wall compliance given weight gain, and worsening COPD. PFT's performed show significant bronchodilator response and reversible obstruction. His TLC is within normal, but his DLCO is significantly decreased consistent with emphysema. Given the bronchodilator response, I've asked him to use his albuterol more frequently, especially before exertion. In addition to continuing his triple therapy, I did counsel them again on the importance of weight loss. I have also placed a referral to pulmonary rehabilitation to help with his symptom of dyspnea.  - AMB referral to pulmonary rehabilitation   Return in about 4 months (around 09/10/2022).  I spent 30 minutes caring for this patient today, including preparing to see the patient, obtaining a medical history , reviewing a separately obtained history, performing a medically appropriate examination and/or evaluation, counseling and educating the patient/family/caregiver, ordering medications, tests, or procedures, and documenting clinical information in the electronic health record  Levi Reichert, MD [facility] 05/12/2022 5:36 PM    End of visit medications:  No orders of the defined types were placed in this encounter.    Current Outpatient Medications:    albuterol (PROVENTIL HFA;VENTOLIN HFA) 108 (90 Base) MCG/ACT inhaler, Inhale 2 puffs into the lungs every 6 (six) hours as needed for wheezing or shortness of breath., Disp: 1 Inhaler, Rfl: 0   apixaban (ELIQUIS) 5 MG TABS tablet, Take 5 mg by mouth 2 (two) times daily., Disp: , Rfl:    carboxymethylcellulose  (REFRESH PLUS) 0.5 % SOLN, 1 drop 4 (four) times daily as needed., Disp: , Rfl:    finasteride (PROSCAR) 5 MG tablet, Take 5 mg by mouth daily., Disp: , Rfl:    fluticasone-salmeterol (ADVAIR) 250-50 MCG/ACT AEPB, Inhale 1 puff into the lungs in the morning and at bedtime., Disp: , Rfl:    levothyroxine (SYNTHROID) 200 MCG tablet, Take 200 mcg by mouth daily before breakfast., Disp: , Rfl:    SEMAGLUTIDE,0.25 OR 0.5MG /DOS, [place], Inject 0.25 mg into the skin once a week., Disp: , Rfl:    tamsulosin (FLOMAX) 0.4 MG CAPS capsule, 0.8 mg daily., Disp: , Rfl:    Tiotropium Bromide Monohydrate (SPIRIVA RESPIMAT) 2.5 MCG/ACT AERS, Inhale 2 puffs into the lungs daily., Disp: 1 each, Rfl: 11   vitamin B-12 (CYANOCOBALAMIN) 1000 MCG tablet, Take 1,000 mcg by mouth daily., Disp: , Rfl:    glimepiride (AMARYL) 2 MG tablet, Take 1 tablet (2 mg total) by mouth every morning., Disp: 30 tablet, Rfl: 0   Subjective:   PATIENT ID: Levi [name] GENDER: male DOB: April 29, 1941, MRN: [id number]  Chief Complaint  Patient presents with   Follow-up    Some better. Still SOB constantly. Gasps for breath when he is moving around. No cough.    HPI  Levi [name] is a pleasant 82 year old male presenting to clinic for the follow up on shortness of breath. He is accompanied by his wife.   They tell me that his symptoms have been chronic, but worsened recently over the past 2 years as he's gained weight. He was seen in clinic by his PCP at the [place] and had been maintained on Advair. During his initial visit with me (01/2022), we added LAMA (Spiriva  Respimat) to his regimen. He reports mild improvement in his shortness of breath with this regimen. He carries a diagnosis of COPD, and reports having had pulmonary function testing at the [place] that I don't have access to. He had PFT's performed at [facility] recently that he is here to review. He uses his Albuterol twice to thrice a day, with some improvement. He reports an occasional cough,  mostly in the morning. He denies any wheezing, chest pain, or chest tightness. His cough is productive of whitish sputum, and he denies a change in the nature of the sputum, and denies any hemoptysis.   They report that he has gained a good amount of weight (60 lbs) that he has been slowly loosing. He's lost around 9 pounds over the past few months. His wife reports a sedentary lifestyle. He quit smoking around the time of his diagnosis with lung cancer. At that time, he underwent navigational bronchoscopy with biopsies. 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 He reports some lower extremity edema, worse at the end of the day. He reports having to go up every 2 hours at night to pee. He is taking tamsulosin for BPH. He does not have any vision problems. He has not had any worsening of his BPH with the initiation of the Spiriva. He is hard of hearing. He has a history of Afib for which he is followed by cardiology. He feels that some of his symptoms are due to his afib.   He did smoke for a long time, with at least 50 to 60 pack years on him. 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 He is currently retired.  Ancillary information including prior medications, full medical/surgical/family/social histories, and PFTs (when available) are listed below and have been reviewed.   ROS   Objective:   Vitals:   05/12/22 1611  BP: 128/72  Pulse: 67  Temp: (!) 97.5 F (36.4 C)  SpO2: 91%  Weight: 270 lb 3.2 oz (122.6 kg)  Height: 5' 11.6" (1.819 m)   91% on RA BMI Readings from Last 3 Encounters:  05/12/22 37.06 kg/m  02/10/22 37.22 kg/m  02/03/22 38.08 kg/m   Wt Readings from Last 3 Encounters:  05/12/22 270 lb 3.2 oz (122.6 kg)  02/10/22 271 lb 6.4 oz (123.1 kg)  02/03/22  273 lb (123.8 kg)    Physical Exam    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[place])    Prostate enlargement    Shortness of breath    Squamous cell carcinoma of lung, left ([facility])    Thyroid disease    Tobacco abuse      Family History  Problem Relation Age of Onset   Aneurysm Mother    Cancer Father      Past Surgical History:  Procedure Laterality Date   CARDIOVERSION N/A    CARDIOVERSION N/A 11/24/2021   Procedure: CARDIOVERSION;  Surgeon: [name], MD;  Location: [facility] ORS;  Service: Cardiovascular;  Laterality: N/A;   ELECTROMAGNETIC NAVIGATION BROCHOSCOPY Left 10/19/2018  Procedure: ELECTROMAGNETIC NAVIGATION BRONCHOSCOPY LEFT;  Surgeon: Tyler Pita, MD;  Location: [facility] ORS;  Service: Cardiopulmonary;  Laterality: Left;   ENDOBRONCHIAL ULTRASOUND Left 10/19/2018   Procedure: ENDOBRONCHIAL ULTRASOUND LEFT;  Surgeon: Tyler Pita, MD;  Location: [facility] ORS;  Service: Cardiopulmonary;  Laterality: Left;   MELANOMA EXCISION Left    NO PAST SURGERIES      Social History   Socioeconomic History   Marital status: Married    Spouse name: [name]   Number of children: 5   Years of education: Not on file   Highest education level: Not on file  Occupational History   Not on file  Tobacco Use   Smoking status: Former    Packs/day: 0.25    Years: 62.00    Total pack years: 15.50    Types: Cigarettes    Quit date: 09/05/2018    Years since quitting: 3.6   Smokeless tobacco: Never  Vaping Use   Vaping Use: Never used  Substance and Sexual Activity   Alcohol use: No    Alcohol/week: 0.0 standard drinks of alcohol   Drug use: No   Sexual activity: Not on file  Other Topics Concern   Not on file  Social History Narrative   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 He is married to his wife of 30+ years, [name] and has 5 children (1 deceased), many grand children, and one great grand child.    Social Determinants of Health   Financial Resource Strain: Low Risk  (12/04/2018)   Overall Financial Resource Strain (CARDIA)    Difficulty of Paying Living Expenses: Not very hard  Food Insecurity: No Food Insecurity (12/04/2018)   Hunger Vital Sign    Worried About Running Out of Food in the Last Year: Never true    Ran Out of Food in the Last Year: Never true  Transportation Needs: No Transportation Needs (12/04/2018)   PRAPARE - Hydrologist (Medical): No    Lack of Transportation (Non-Medical): No  Physical Activity: Not on file  Stress: Stress Concern Present (12/04/2018)   [place]    Feeling of Stress : Very much  Social Connections: Unknown (12/04/2018)   Social Connection and Isolation Panel [NHANES]    Frequency of Communication with Friends and Family: More than three times a week    Frequency of Social Gatherings with Friends and Family: Once a week    Attends Religious Services: Not on file    Active Member of Clubs or Organizations: Not on file    Attends Archivist Meetings: Not on file    Marital Status: Not on file  Intimate Partner Violence: Not on file     No Known Allergies   CBC    Component Value Date/Time   WBC 8.2 01/05/2022 1204   RBC 5.62 01/05/2022 1204   HGB 16.7 01/05/2022 1204   HCT 51.7 01/05/2022 1204   PLT 197 01/05/2022 1204   MCV 92.0 01/05/2022 1204   MCV 90.0 10/20/2015 1020   MCH 29.7 01/05/2022 1204   MCHC 32.3 01/05/2022 1204   RDW 14.3 01/05/2022 1204   LYMPHSABS 1.5 01/05/2022 1204   MONOABS 0.8 01/05/2022 1204   EOSABS 0.4 01/05/2022 1204    BASOSABS 0.1 01/05/2022 1204  Pulmonary Functions Testing Results:    Latest Ref Rng & Units 04/22/2022   12:02 PM  PFT Results  FVC-Pre L 3.75   FVC-Predicted Pre % 85   FVC-Post L 4.15   FVC-Predicted Post % 93   Pre FEV1/FVC % % 59   Post FEV1/FCV % % 64   FEV1-Pre L 2.20   FEV1-Predicted Pre % 69   FEV1-Post L 2.66   DLCO uncorrected ml/min/mmHg 11.02   DLCO UNC% % 42   DLVA Predicted % 47   TLC L 6.33   TLC % Predicted % 84   RV % Predicted % 78     Outpatient Medications Prior to Visit  Medication Sig Dispense Refill   albuterol (PROVENTIL HFA;VENTOLIN HFA) 108 (90 Base) MCG/ACT inhaler Inhale 2 puffs into the lungs every 6 (six) hours as needed for wheezing or shortness of breath. 1 Inhaler 0   apixaban (ELIQUIS) 5 MG TABS tablet Take 5 mg by mouth 2 (two) times daily.     carboxymethylcellulose (REFRESH PLUS) 0.5 % SOLN 1 drop 4 (four) times daily as needed.     finasteride (PROSCAR) 5 MG tablet Take 5 mg by mouth daily.     fluticasone-salmeterol (ADVAIR) 250-50 MCG/ACT AEPB Inhale 1 puff into the lungs in the morning and at bedtime.     levothyroxine (SYNTHROID) 200 MCG tablet Take 200 mcg by mouth daily before breakfast.     SEMAGLUTIDE,0.25 OR 0.5MG /DOS, [place] Inject 0.25 mg into the skin once a week.     tamsulosin (FLOMAX) 0.4 MG CAPS capsule 0.8 mg daily.     Tiotropium Bromide Monohydrate (SPIRIVA RESPIMAT) 2.5 MCG/ACT AERS Inhale 2 puffs into the lungs daily. 1 each 11   vitamin B-12 (CYANOCOBALAMIN) 1000 MCG tablet Take 1,000 mcg by mouth daily.     glimepiride (AMARYL) 2 MG tablet Take 1 tablet (2 mg total) by mouth every morning. 30 tablet 0   No facility-administered medications prior to visit.

## 2022-05-12 NOTE — Telephone Encounter (Signed)
Error

## 2022-05-12 NOTE — Telephone Encounter (Signed)
Patient states he would like to start the meds for afib please assist would like to discuss.

## 2022-05-12 NOTE — Telephone Encounter (Signed)
Dr. Caryl Comes- this patient was seen in October 2023. You had mentioned in per phone notes after his last appointment, that he could possibly try Tikosyn.  See 03/01/22 phone note for details.  Would you want him to have a virtual visit or office visit to discuss further since his last visit was 3 months ago?

## 2022-05-13 ENCOUNTER — Ambulatory Visit
Payer: No Typology Code available for payment source | Admitting: Student in an Organized Health Care Education/Training Program

## 2022-05-17 ENCOUNTER — Encounter: Payer: Medicare Other | Attending: Student in an Organized Health Care Education/Training Program | Admitting: *Deleted

## 2022-05-17 DIAGNOSIS — C3490 Malignant neoplasm of unspecified part of unspecified bronchus or lung: Secondary | ICD-10-CM

## 2022-05-17 DIAGNOSIS — M6281 Muscle weakness (generalized): Secondary | ICD-10-CM | POA: Insufficient documentation

## 2022-05-17 DIAGNOSIS — J439 Emphysema, unspecified: Secondary | ICD-10-CM | POA: Insufficient documentation

## 2022-05-17 DIAGNOSIS — R0602 Shortness of breath: Secondary | ICD-10-CM | POA: Insufficient documentation

## 2022-05-17 NOTE — Progress Notes (Signed)
Initial phone call completed. Diagnosis can be found in [facility] 1/11. EP Orientation scheduled for Thursday 1/18 at 2pm.

## 2022-05-19 NOTE — Telephone Encounter (Signed)
I agree with you--given the lack of clarity about the next choice, ie symptoms while he was in afib, and having felt better off dronaderone, if he iw worse again we might want to think  about why >> a telehealth visit is a great start  Thanks SK

## 2022-05-20 NOTE — Telephone Encounter (Signed)
I spoke with the patient and his wife. I have advised them that per Dr. Graciela Husbands, since the last office visit or discussion about medical therapy for atrial fibrillation was ~ 3 months ago the patient will need a visit with Dr. Graciela Husbands to discuss options further.  Per the patient, he would prefer to come in to the office to discuss this.  I have offered him an appointment for Thursday 06/09/22 at 11:40am.  The patient and his wife voice understanding and are agreeable.

## 2022-05-26 VITALS — Ht 71.0 in | Wt 270.5 lb

## 2022-05-26 DIAGNOSIS — M6281 Muscle weakness (generalized): Secondary | ICD-10-CM | POA: Diagnosis not present

## 2022-05-26 DIAGNOSIS — R0602 Shortness of breath: Secondary | ICD-10-CM | POA: Diagnosis not present

## 2022-05-26 DIAGNOSIS — J439 Emphysema, unspecified: Secondary | ICD-10-CM | POA: Diagnosis not present

## 2022-05-26 NOTE — Progress Notes (Signed)
Pulmonary Individual Treatment Plan  Patient Details  Name: Levi [name] MRN: [id number] Date of Birth: 03/08/1941 Referring Provider:   April Manson Pulmonary Rehab from 05/26/2022 in [facility] Cardiac and Pulmonary Rehab  Referring Provider Armando Reichert MD       Initial Encounter Date:  Flowsheet Row Pulmonary Rehab from 05/26/2022 in [facility] Cardiac and Pulmonary Rehab  Date 05/26/22       Visit Diagnosis: Pulmonary emphysema, unspecified emphysema type ([place])  Patient's Home Medications on Admission:  Current Outpatient Medications:    albuterol (PROVENTIL HFA;VENTOLIN HFA) 108 (90 Base) MCG/ACT inhaler, Inhale 2 puffs into the lungs every 6 (six) hours as needed for wheezing or shortness of breath., Disp: 1 Inhaler, Rfl: 0   apixaban (ELIQUIS) 5 MG TABS tablet, Take 5 mg by mouth 2 (two) times daily., Disp: , Rfl:    carboxymethylcellulose (REFRESH PLUS) 0.5 % SOLN, 1 drop 4 (four) times daily as needed., Disp: , Rfl:    finasteride (PROSCAR) 5 MG tablet, Take 5 mg by mouth daily., Disp: , Rfl:    fluticasone-salmeterol (ADVAIR) 250-50 MCG/ACT AEPB, Inhale 1 puff into the lungs in the morning and at bedtime., Disp: , Rfl:    glimepiride (AMARYL) 2 MG tablet, Take 1 tablet (2 mg total) by mouth every morning., Disp: 30 tablet, Rfl: 0   levothyroxine (SYNTHROID) 200 MCG tablet, Take 200 mcg by mouth daily before breakfast., Disp: , Rfl:    SEMAGLUTIDE,0.25 OR 0.5MG /DOS, [place], Inject 0.25 mg into the skin once a week., Disp: , Rfl:    tamsulosin (FLOMAX) 0.4 MG CAPS capsule, 0.8 mg daily., Disp: , Rfl:    Tiotropium Bromide Monohydrate (SPIRIVA RESPIMAT) 2.5 MCG/ACT AERS, Inhale 2 puffs into the lungs daily., Disp: 1 each, Rfl: 11   vitamin B-12 (CYANOCOBALAMIN) 1000 MCG tablet, Take 1,000 mcg by mouth daily., Disp: , Rfl:   Past Medical History: Past Medical History:  Diagnosis Date   Asthma    COPD (chronic obstructive pulmonary disease) ([place])    Hearing loss    Hypertension     Hypothyroidism    Mass of left lung    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[place])    Prostate enlargement    Shortness of breath    Squamous cell carcinoma of lung, left ([facility])    Thyroid disease    Tobacco abuse     Tobacco Use: Social History   Tobacco Use  Smoking Status Former   Packs/day: 0.25   Years: 62.00   Total pack years: 15.50   Types: Cigarettes   Quit date: 09/05/2018   Years since quitting: 3.7  Smokeless Tobacco Never    Labs: Review Flowsheet       Latest Ref Rng & Units 10/15/2018 03/09/2021  Labs for ITP Cardiac and Pulmonary Rehab  Hemoglobin A1c 4.8 - 5.6 % - 7.8   PH, Arterial 7.350 - 7.450 7.42  -  PCO2 arterial 32.0 - 48.0 mmHg 35  -  Bicarbonate 20.0 - 28.0 mmol/L 22.7  -  Acid-base deficit 0.0 - 2.0 mmol/L 1.3  -  O2 Saturation % 93.1  -     Pulmonary Assessment Scores:  Pulmonary Assessment Scores     Row Name 05/26/22 1539         ADL UCSD   ADL Phase Entry     SOB Score total 77     Rest 0     Walk 3     Stairs 4     Bath 3  Dress 3     Shop 3       CAT Score   CAT Score 21       mMRC Score   mMRC Score 2              UCSD: Self-administered rating of dyspnea associated with activities of daily living (ADLs) 6-point scale (0 = "not at all" to 5 = "maximal or unable to do because of breathlessness")  Scoring Scores range from 0 to 120.  Minimally important difference is 5 units  CAT: CAT can identify the health impairment of COPD patients and is better correlated with disease progression.  CAT has a scoring range of zero to 40. The CAT score is classified into four groups of low (less than 10), medium (10 - 20), high (21-30) and very high (31-40) based on the impact level of disease on health status. A CAT score over 10 suggests significant symptoms.  A worsening CAT score could be explained by an exacerbation, poor medication adherence, poor inhaler technique, or progression of COPD or comorbid conditions.  CAT MCID is 2  points  mMRC: mMRC (Modified Medical Research Council) Dyspnea Scale is used to assess the degree of baseline functional disability in patients of respiratory disease due to dyspnea. No minimal important difference is established. A decrease in score of 1 point or greater is considered a positive change.   Pulmonary Function Assessment:   Exercise Target Goals: Exercise Program Goal: Individual exercise prescription set using results from initial 6 min walk test and THRR while considering  patient's activity barriers and safety.   Exercise Prescription Goal: Initial exercise prescription builds to 30-45 minutes a day of aerobic activity, 2-3 days per week.  Home exercise guidelines will be given to patient during program as part of exercise prescription that the participant will acknowledge.  Education: Aerobic Exercise: - Group verbal and visual presentation on the components of exercise prescription. Introduces F.I.T.T principle from ACSM for exercise prescriptions.  Reviews F.I.T.T. principles of aerobic exercise including progression. Written material given at graduation.   Education: Resistance Exercise: - Group verbal and visual presentation on the components of exercise prescription. Introduces F.I.T.T principle from ACSM for exercise prescriptions  Reviews F.I.T.T. principles of resistance exercise including progression. Written material given at graduation.    Education: Exercise & Equipment Safety: - Individual verbal instruction and demonstration of equipment use and safety with use of the equipment. Flowsheet Row Pulmonary Rehab from 05/26/2022 in [facility] Cardiac and Pulmonary Rehab  Education need identified 05/26/22  Date 05/26/22  Educator KW  Instruction Review Code 1- Verbalizes Understanding       Education: Exercise Physiology & General Exercise Guidelines: - Group verbal and written instruction with models to review the exercise physiology of the cardiovascular  system and associated critical values. Provides general exercise guidelines with specific guidelines to those with heart or lung disease.    Education: Flexibility, Balance, Mind/Body Relaxation: - Group verbal and visual presentation with interactive activity on the components of exercise prescription. Introduces F.I.T.T principle from ACSM for exercise prescriptions. Reviews F.I.T.T. principles of flexibility and balance exercise training including progression. Also discusses the mind body connection.  Reviews various relaxation techniques to help reduce and manage stress (i.e. Deep breathing, progressive muscle relaxation, and visualization). Balance handout provided to take home. Written material given at graduation. Flowsheet Row Pulmonary Rehab from 10/08/2020 in [facility] Cardiac and Pulmonary Rehab  Date 09/03/20  Educator AS  Instruction Review Code 1- Verbalizes Understanding  Activity Barriers & Risk Stratification:  Activity Barriers & Cardiac Risk Stratification - 05/26/22 1552       Activity Barriers & Cardiac Risk Stratification   Activity Barriers Deconditioning;Shortness of Breath;Muscular Weakness;Back Problems    Comments Sciatica             6 Minute Walk:  6 Minute Walk     Row Name 05/26/22 1554         6 Minute Walk   Phase Initial     Distance 890 feet     Walk Time 6 minutes     # of Rest Breaks 0     MPH 1.68     METS 1.15     RPE 13     Perceived Dyspnea  3     VO2 Peak 4.03     Symptoms Yes (comment)     Comments Back pain caused from sciatica 9/10, SOB     Resting HR 66 bpm     Resting BP 140/76     Resting Oxygen Saturation  93 %     Exercise Oxygen Saturation  during 6 min walk 86 %     Max Ex. HR 94 bpm     Max Ex. BP 148/74     2 Minute Post BP 136/76       Interval HR   1 Minute HR 81     2 Minute HR 91     3 Minute HR 94     4 Minute HR 94     5 Minute HR 91     6 Minute HR 90     2 Minute Post HR 93     Interval Heart  Rate? Yes       Interval Oxygen   Interval Oxygen? Yes     Baseline Oxygen Saturation % 93 %     1 Minute Oxygen Saturation % 91 %     1 Minute Liters of Oxygen 0 L  RA     2 Minute Oxygen Saturation % 88 %     2 Minute Liters of Oxygen 0 L     3 Minute Oxygen Saturation % 87 %     3 Minute Liters of Oxygen 0 L     4 Minute Oxygen Saturation % 88 %     4 Minute Liters of Oxygen 0 L     5 Minute Oxygen Saturation % 87 %     5 Minute Liters of Oxygen 0 L     6 Minute Oxygen Saturation % 86 %     6 Minute Liters of Oxygen 0 L     2 Minute Post Oxygen Saturation % 93 %     2 Minute Post Liters of Oxygen 0 L             Oxygen Initial Assessment:  Oxygen Initial Assessment - 05/26/22 1538       Home Oxygen   Home Oxygen Device None   prn only- does not use currently   Sleep Oxygen Prescription None    Home Exercise Oxygen Prescription None    Home Resting Oxygen Prescription None    Compliance with Home Oxygen Use Yes   prn     Initial 6 min Walk   Oxygen Used None      Program Oxygen Prescription   Program Oxygen Prescription None   prn     Intervention   Short Term Goals To learn and understand importance  of monitoring SPO2 with pulse oximeter and demonstrate accurate use of the pulse oximeter.;To learn and understand importance of maintaining oxygen saturations>88%;To learn and demonstrate proper pursed lip breathing techniques or other breathing techniques. ;To learn and demonstrate proper use of respiratory medications;To learn and exhibit compliance with exercise, home and travel O2 prescription    Long  Term Goals Maintenance of O2 saturations>88%;Exhibits proper breathing techniques, such as pursed lip breathing or other method taught during program session;Exhibits compliance with exercise, home  and travel O2 prescription;Compliance with respiratory medication;Demonstrates proper use of MDI's;Verbalizes importance of monitoring SPO2 with pulse oximeter and return  demonstration             Oxygen Re-Evaluation:   Oxygen Discharge (Final Oxygen Re-Evaluation):   Initial Exercise Prescription:  Initial Exercise Prescription - 05/26/22 1600       Date of Initial Exercise RX and Referring Provider   Date 05/26/22    Referring Provider Armando Reichert MD      Oxygen   Maintain Oxygen Saturation 88% or higher      Recumbant Bike   Level 1    RPM 60    Watts 15    Minutes 15    METs 1.1      T5 Nustep   Level 1    SPM 80    Minutes 15    METs 1.1      Biostep-RELP   Level 1    SPM 50    Minutes 15    METs 1.1      Track   Laps 17    Minutes 15    METs 1.92      Prescription Details   Frequency (times per week) 2    Duration Progress to 30 minutes of continuous aerobic without signs/symptoms of physical distress      Intensity   THRR 40-80% of Max Heartrate 96 - 126    Ratings of Perceived Exertion 11-13    Perceived Dyspnea 0-4      Progression   Progression Continue to progress workloads to maintain intensity without signs/symptoms of physical distress.      Resistance Training   Training Prescription Yes    Weight 4 lb    Reps 10-15             Perform Capillary Blood Glucose checks as needed.  Exercise Prescription Changes:   Exercise Prescription Changes     Row Name 05/26/22 1600             Response to Exercise   Blood Pressure (Admit) 140/76       Blood Pressure (Exercise) 148/74       Blood Pressure (Exit) 134/72       Heart Rate (Admit) 66 bpm       Heart Rate (Exercise) 94 bpm       Heart Rate (Exit) 73 bpm       Oxygen Saturation (Admit) 93 %       Oxygen Saturation (Exercise) 86 %       Oxygen Saturation (Exit) 93 %       Rating of Perceived Exertion (Exercise) 13       Perceived Dyspnea (Exercise) 3       Symptoms Back pain 9/10, SOB       Comments walk test results                Exercise Comments:   Exercise Goals and Review:   Exercise Goals  [place] Name  05/26/22 1618             Exercise Goals   Increase Physical Activity Yes       Intervention Provide advice, education, support and counseling about physical activity/exercise needs.;Develop an individualized exercise prescription for aerobic and resistive training based on initial evaluation findings, risk stratification, comorbidities and participant's personal goals.       Expected Outcomes Short Term: Attend rehab on a regular basis to increase amount of physical activity.;Long Term: Add in home exercise to make exercise part of routine and to increase amount of physical activity.;Long Term: Exercising regularly at least 3-5 days a week.       Increase Strength and Stamina Yes       Intervention Provide advice, education, support and counseling about physical activity/exercise needs.;Develop an individualized exercise prescription for aerobic and resistive training based on initial evaluation findings, risk stratification, comorbidities and participant's personal goals.       Expected Outcomes Short Term: Increase workloads from initial exercise prescription for resistance, speed, and METs.;Short Term: Perform resistance training exercises routinely during rehab and add in resistance training at home;Long Term: Improve cardiorespiratory fitness, muscular endurance and strength as measured by increased METs and functional capacity (6MWT)       Able to understand and use rate of perceived exertion (RPE) scale Yes       Intervention Provide education and explanation on how to use RPE scale       Expected Outcomes Short Term: Able to use RPE daily in rehab to express subjective intensity level;Long Term:  Able to use RPE to guide intensity level when exercising independently       Able to understand and use Dyspnea scale Yes       Intervention Provide education and explanation on how to use Dyspnea scale       Expected Outcomes Short Term: Able to use Dyspnea scale daily in rehab to express  subjective sense of shortness of breath during exertion;Long Term: Able to use Dyspnea scale to guide intensity level when exercising independently       Knowledge and understanding of Target Heart Rate Range (THRR) Yes       Intervention Provide education and explanation of THRR including how the numbers were predicted and where they are located for reference       Expected Outcomes Short Term: Able to state/look up THRR;Short Term: Able to use daily as guideline for intensity in rehab;Long Term: Able to use THRR to govern intensity when exercising independently       Able to check pulse independently Yes       Intervention Provide education and demonstration on how to check pulse in carotid and radial arteries.;Review the importance of being able to check your own pulse for safety during independent exercise       Expected Outcomes Short Term: Able to explain why pulse checking is important during independent exercise;Long Term: Able to check pulse independently and accurately       Understanding of Exercise Prescription Yes       Intervention Provide education, explanation, and written materials on patient's individual exercise prescription       Expected Outcomes Short Term: Able to explain program exercise prescription;Long Term: Able to explain home exercise prescription to exercise independently                Exercise Goals Re-Evaluation :   Discharge Exercise Prescription (Final Exercise Prescription Changes):  Exercise Prescription Changes -  05/26/22 1600       Response to Exercise   Blood Pressure (Admit) 140/76    Blood Pressure (Exercise) 148/74    Blood Pressure (Exit) 134/72    Heart Rate (Admit) 66 bpm    Heart Rate (Exercise) 94 bpm    Heart Rate (Exit) 73 bpm    Oxygen Saturation (Admit) 93 %    Oxygen Saturation (Exercise) 86 %    Oxygen Saturation (Exit) 93 %    Rating of Perceived Exertion (Exercise) 13    Perceived Dyspnea (Exercise) 3    Symptoms Back pain  9/10, SOB    Comments walk test results             Nutrition:  Target Goals: Understanding of nutrition guidelines, daily intake of sodium 1500mg , cholesterol 200mg , calories 30% from fat and 7% or less from saturated fats, daily to have 5 or more servings of fruits and vegetables.  Education: All About Nutrition: -Group instruction provided by verbal, written material, interactive activities, discussions, models, and posters to present general guidelines for heart healthy nutrition including fat, fiber, MyPlate, the role of sodium in heart healthy nutrition, utilization of the nutrition label, and utilization of this knowledge for meal planning. Follow up email sent as well. Written material given at graduation. Flowsheet Row Pulmonary Rehab from 10/08/2020 in [facility] Cardiac and Pulmonary Rehab  Date 09/10/20  Educator [facility]  Instruction Review Code 1- Verbalizes Understanding       Biometrics:  Pre Biometrics - 05/26/22 1553       Pre Biometrics   Height 5\' 11"  (1.803 m)    Weight 270 lb 8 oz (122.7 kg)    Waist Circumference 55 inches    Hip Circumference 48.5 inches    Waist to Hip Ratio 1.13 %    BMI (Calculated) 37.74    Single Leg Stand 3.2 seconds              Nutrition Therapy Plan and Nutrition Goals:  Nutrition Therapy & Goals - 05/26/22 1547       Nutrition Therapy   RD appointment deferred Yes   Deferred     Intervention Plan   Intervention Prescribe, educate and counsel regarding individualized specific dietary modifications aiming towards targeted core components such as weight, hypertension, lipid management, diabetes, heart failure and other comorbidities.    Expected Outcomes Short Term Goal: Understand basic principles of dietary content, such as calories, fat, sodium, cholesterol and nutrients.;Short Term Goal: A plan has been developed with personal nutrition goals set during dietitian appointment.;Long Term Goal: Adherence to prescribed nutrition  plan.             Nutrition Assessments:  MEDIFICTS Score Key: ?70 Need to make dietary changes  40-70 Heart Healthy Diet ? 40 Therapeutic Level Cholesterol Diet  Flowsheet Row Pulmonary Rehab from 05/26/2022 in [facility] Cardiac and Pulmonary Rehab  Picture Your Plate Total Score on Admission 59      Picture Your Plate Scores: <49 Unhealthy dietary pattern with much room for improvement. 41-50 Dietary pattern unlikely to meet recommendations for good health and room for improvement. 51-60 More healthful dietary pattern, with some room for improvement.  >60 Healthy dietary pattern, although there may be some specific behaviors that could be improved.   Nutrition Goals Re-Evaluation:   Nutrition Goals Discharge (Final Nutrition Goals Re-Evaluation):   Psychosocial: Target Goals: Acknowledge presence or absence of significant depression and/or stress, maximize coping skills, provide positive support system. Participant is able to  verbalize types and ability to use techniques and skills needed for reducing stress and depression.   Education: Stress, Anxiety, and Depression - Group verbal and visual presentation to define topics covered.  Reviews how body is impacted by stress, anxiety, and depression.  Also discusses healthy ways to reduce stress and to treat/manage anxiety and depression.  Written material given at graduation. Flowsheet Row Pulmonary Rehab from 10/08/2020 in [facility] Cardiac and Pulmonary Rehab  Date 10/08/20  Educator [facility]  Instruction Review Code 1- [employer] Understanding       Education: Sleep Hygiene -Provides group verbal and written instruction about how sleep can affect your health.  Define sleep hygiene, discuss sleep cycles and impact of sleep habits. Review good sleep hygiene tips.    Initial Review & Psychosocial Screening:  Initial Psych Review & Screening - 05/17/22 1410       Initial Review   Current issues with Current Stress Concerns    Source  of Stress Concerns Unable to participate in former interests or hobbies;Unable to perform yard/household activities;Chronic Illness      Family Dynamics   Good Support System? Yes   wife     Barriers   Psychosocial barriers to participate in program There are no identifiable barriers or psychosocial needs.;The patient should benefit from training in stress management and relaxation.      Screening Interventions   Interventions Encouraged to exercise;Provide feedback about the scores to participant;To provide support and resources with identified psychosocial needs    Expected Outcomes Short Term goal: Utilizing psychosocial counselor, staff and physician to assist with identification of specific Stressors or current issues interfering with healing process. Setting desired goal for each stressor or current issue identified.;Long Term Goal: Stressors or current issues are controlled or eliminated.;Short Term goal: Identification and review with participant of any Quality of Life or Depression concerns found by scoring the questionnaire.;Long Term goal: The participant improves quality of Life and PHQ9 Scores as seen by post scores and/or verbalization of changes             Quality of Life Scores:  Scores of 19 and below usually indicate a poorer quality of life in these areas.  A difference of  2-3 points is a clinically meaningful difference.  A difference of 2-3 points in the total score of the Quality of Life Index has been associated with significant improvement in overall quality of life, self-image, physical symptoms, and general health in studies assessing change in quality of life.  PHQ-9: Review Flowsheet  More data exists      05/26/2022 01/26/2021 08/31/2020 10/20/2015 10/16/2015  Depression screen PHQ 2/9  Decreased Interest 1 1 1  0 0  Down, Depressed, Hopeless 0 0 0 0 0  PHQ - 2 Score 1 1 1  0 0  Altered sleeping 0 1 0 - -  Tired, decreased energy 3 2 1  - -  Change in appetite 0  2 0 - -  Feeling bad or failure about yourself  0 0 0 - -  Trouble concentrating 0 0 0 - -  Moving slowly or fidgety/restless 0 0 0 - -  Suicidal thoughts 0 0 0 - -  PHQ-9 Score 4 6 2  - -  Difficult doing work/chores Not difficult at all Somewhat difficult Not difficult at all - -   Interpretation of Total Score  Total Score Depression Severity:  1-4 = Minimal depression, 5-9 = Mild depression, 10-14 = Moderate depression, 15-19 = Moderately severe depression, 20-27 = Severe  depression   Psychosocial Evaluation and Intervention:  Psychosocial Evaluation - 05/17/22 1416       Psychosocial Evaluation & Interventions   Interventions Encouraged to exercise with the program and follow exercise prescription    Comments Guinn 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 He has done the program before and is familiar with what to expect. 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 His wife is his main support system and is very involved in his health care. She states that his doctor wants him to lose weight and thinks that will help with his breathing and afib. She is concerned about his stamina. He mentioned she probably knows more about his health than he does. He does want to increase his stamina and feel stronger while improving his breathing. His health journey has been rough these last few years and he wants to focus on feeling better.    Expected Outcomes Short: attend pulmonary rehab for education and exercise. Long: Develop and maintain positive self care habits.    Continue Psychosocial Services  Follow up required by staff             Psychosocial Re-Evaluation:   Psychosocial Discharge (Final Psychosocial Re-Evaluation):   Education: Education Goals: Education classes will be provided on a weekly basis, covering required topics. Participant will state understanding/return demonstration of topics presented.  Learning  Barriers/Preferences:  Learning Barriers/Preferences - 05/17/22 1408       Learning Barriers/Preferences   Learning Barriers Hearing    Learning Preferences None             General Pulmonary Education Topics:  Infection Prevention: - Provides verbal and written material to individual with discussion of infection control including proper hand washing and proper equipment cleaning during exercise session. Flowsheet Row Pulmonary Rehab from 05/26/2022 in [facility] Cardiac and Pulmonary Rehab  Education need identified 05/26/22  Date 05/26/22  Educator KW  Instruction Review Code 1- Verbalizes Understanding       Falls Prevention: - Provides verbal and written material to individual with discussion of falls prevention and safety. Flowsheet Row Pulmonary Rehab from 05/26/2022 in [facility] Cardiac and Pulmonary Rehab  Education need identified 05/26/22  Date 05/26/22  Educator KW  Instruction Review Code 1- Verbalizes Understanding       Chronic Lung Disease Review: - Group verbal instruction with posters, models, PowerPoint presentations and videos,  to review new updates, new respiratory medications, new advancements in procedures and treatments. Providing information on websites and "800" numbers for continued self-education. Includes information about supplement oxygen, available portable oxygen systems, continuous and intermittent flow rates, oxygen safety, concentrators, and Medicare reimbursement for oxygen. Explanation of Pulmonary Drugs, including class, frequency, complications, importance of spacers, rinsing mouth after steroid MDI's, and proper cleaning methods for nebulizers. Review of basic lung anatomy and physiology related to function, structure, and complications of lung disease. Review of risk factors. Discussion about methods for diagnosing sleep apnea and types of masks and machines for OSA. Includes a review of the use of types of environmental controls: home humidity,  furnaces, filters, dust mite/pet prevention, HEPA vacuums. Discussion about weather changes, air quality and the benefits of nasal washing. Instruction on Warning signs, infection symptoms, calling MD promptly, preventive modes, and value of vaccinations. Review of effective airway clearance, coughing and/or vibration techniques. Emphasizing that all should Create an Action Plan. Written material given at graduation. Flowsheet Row Pulmonary Rehab from 05/26/2022 in [facility] Cardiac and Pulmonary Rehab  Education need identified 05/26/22       AED/CPR: - Group verbal and written instruction with the use of models to demonstrate the basic use of the AED with the basic ABC's of resuscitation.    Anatomy and Cardiac Procedures: - Group verbal and visual presentation and models provide information about basic cardiac anatomy and function. Reviews the testing methods done to diagnose heart disease and the outcomes of the test results. Describes the treatment choices: Medical Management, Angioplasty, or Coronary Bypass Surgery for treating various heart conditions including Myocardial Infarction, Angina, Valve Disease, and Cardiac Arrhythmias.  Written material given at graduation.   Medication Safety: - Group verbal and visual instruction to review commonly prescribed medications for heart and lung disease. Reviews the medication, class of the drug, and side effects. Includes the steps to properly store meds and maintain the prescription regimen.  Written material given at graduation. Flowsheet Row Pulmonary Rehab from 10/08/2020 in [facility] Cardiac and Pulmonary Rehab  Date 09/17/20  Educator SB  Instruction Review Code 1- Verbalizes Understanding       Other: -Provides group and verbal instruction on various topics (see comments)   Knowledge Questionnaire Score:  Knowledge Questionnaire Score - 05/26/22 1536       Knowledge Questionnaire Score   Pre Score 17/18              Core  Components/Risk Factors/Patient Goals at Admission:  Personal Goals and Risk Factors at Admission - 05/26/22 1618       Core Components/Risk Factors/Patient Goals on Admission    Weight Management Yes;Weight Loss;Obesity    Intervention Weight Management: Develop a combined nutrition and exercise program designed to reach desired caloric intake, while maintaining appropriate intake of nutrient and fiber, sodium and fats, and appropriate energy expenditure required for the weight goal.;Weight Management: Provide education and appropriate resources to help participant work on and attain dietary goals.;Weight Management/Obesity: Establish reasonable short term and long term weight goals.;Obesity: Provide education and appropriate resources to help participant work on and attain dietary goals.    Admit Weight 270 lb (122.5 kg)    Goal Weight: Short Term 265 lb (120.2 kg)    Goal Weight: Long Term 230 lb (104.3 kg)    Expected Outcomes Short Term: Continue to assess and modify interventions until short term weight is achieved;Long Term: Adherence to nutrition and physical activity/exercise program aimed toward attainment of established weight goal;Weight Loss: Understanding of general recommendations for a balanced deficit meal plan, which promotes 1-2 lb weight loss per week and includes a negative energy balance of [phone] kcal/d;Understanding recommendations for meals to include 15-35% energy as protein, 25-35% energy from fat, 35-60% energy from carbohydrates, less than 200mg  of dietary cholesterol, 20-35 gm of total fiber daily;Understanding of distribution of calorie intake throughout the day with the consumption of 4-5 meals/snacks    Improve shortness of breath with ADL's Yes    Intervention Provide education, individualized exercise plan and daily activity instruction to help decrease symptoms of SOB with activities of daily living.    Expected Outcomes Short Term: Improve cardiorespiratory fitness  to achieve a reduction of symptoms when performing ADLs;Long Term: Be able to perform more ADLs without symptoms or delay the onset of symptoms    Diabetes Yes    Intervention Provide education about signs/symptoms and action to take for hypo/hyperglycemia.;Provide education about proper nutrition, including hydration, and aerobic/resistive exercise prescription along with prescribed medications to achieve blood glucose in normal ranges: Fasting glucose 65-99 mg/dL  Expected Outcomes Short Term: Participant verbalizes understanding of the signs/symptoms and immediate care of hyper/hypoglycemia, proper foot care and importance of medication, aerobic/resistive exercise and nutrition plan for blood glucose control.;Long Term: Attainment of HbA1C < 7%.    Hypertension Yes    Intervention Provide education on lifestyle modifcations including regular physical activity/exercise, weight management, moderate sodium restriction and increased consumption of fresh fruit, vegetables, and low fat dairy, alcohol moderation, and smoking cessation.;Monitor prescription use compliance.    Expected Outcomes Short Term: Continued assessment and intervention until BP is < 140/52mm HG in hypertensive participants. < 130/13mm HG in hypertensive participants with diabetes, heart failure or chronic kidney disease.;Long Term: Maintenance of blood pressure at goal levels.             Education:Diabetes - Individual verbal and written instruction to review signs/symptoms of diabetes, desired ranges of glucose level fasting, after meals and with exercise. Acknowledge that pre and post exercise glucose checks will be done for 3 sessions at entry of program. Flowsheet Row Pulmonary Rehab from 05/17/2022 in [facility] Cardiac and Pulmonary Rehab  Date 05/17/22  Educator [facility]  Instruction Review Code 1- Verbalizes Understanding       Know Your Numbers and Heart Failure: - Group verbal and visual instruction to discuss disease risk  factors for cardiac and pulmonary disease and treatment options.  Reviews associated critical values for Overweight/Obesity, Hypertension, Cholesterol, and Diabetes.  Discusses basics of heart failure: signs/symptoms and treatments.  Introduces Heart Failure Zone chart for action plan for heart failure.  Written material given at graduation. Flowsheet Row Pulmonary Rehab from 10/08/2020 in [facility] Cardiac and Pulmonary Rehab  Date 09/24/20  Educator SB  Instruction Review Code 1- Verbalizes Understanding       Core Components/Risk Factors/Patient Goals Review:    Core Components/Risk Factors/Patient Goals at Discharge (Final Review):    ITP Comments:  ITP Comments     Row Name 05/17/22 1417 05/26/22 1611         ITP Comments Initial phone call completed. Diagnosis can be found in [facility] 1/11. EP Orientation scheduled for Thursday 1/18 at 2pm. Completed 6MWT and gym orientation. Initial ITP created and sent for review to Dr. Ottie Glazier, , Medical Director.               Comments: Initial ITP

## 2022-05-26 NOTE — Patient Instructions (Signed)
Patient Instructions  Patient Details  Name: Levi [name] MRN: [id number] Date of Birth: December 16, 1940 Referring Provider:  Armando Reichert, MD  Below are your personal goals for exercise, nutrition, and risk factors. Our goal is to help you stay on track towards obtaining and maintaining these goals. We will be discussing your progress on these goals with you throughout the program.  Initial Exercise Prescription:  Initial Exercise Prescription - 05/26/22 1600       Date of Initial Exercise RX and Referring Provider   Date 05/26/22    Referring Provider Armando Reichert MD      Oxygen   Maintain Oxygen Saturation 88% or higher      Recumbant Bike   Level 1    RPM 60    Watts 15    Minutes 15    METs 1.1      T5 Nustep   Level 1    SPM 80    Minutes 15    METs 1.1      Biostep-RELP   Level 1    SPM 50    Minutes 15    METs 1.1      Track   Laps 17    Minutes 15    METs 1.92      Prescription Details   Frequency (times per week) 2    Duration Progress to 30 minutes of continuous aerobic without signs/symptoms of physical distress      Intensity   THRR 40-80% of Max Heartrate 96 - 126    Ratings of Perceived Exertion 11-13    Perceived Dyspnea 0-4      Progression   Progression Continue to progress workloads to maintain intensity without signs/symptoms of physical distress.      Resistance Training   Training Prescription Yes    Weight 4 lb    Reps 10-15             Exercise Goals: Frequency: Be able to perform aerobic exercise two to three times per week in program working toward 2-5 days per week of home exercise.  Intensity: Work with a perceived exertion of 11 (fairly light) - 15 (hard) while following your exercise prescription.  We will make changes to your prescription with you as you progress through the program.   Duration: Be able to do 30 to 45 minutes of continuous aerobic exercise in addition to a 5 minute warm-up and a 5 minute  cool-down routine.   Nutrition Goals: Your personal nutrition goals will be established when you do your nutrition analysis with the dietician.  The following are general nutrition guidelines to follow: Cholesterol < 200mg /day Sodium < 1500mg /day Fiber: Men over 50 yrs - 30 grams per day  Personal Goals:  Personal Goals and Risk Factors at Admission - 05/26/22 1618       Core Components/Risk Factors/Patient Goals on Admission    Weight Management Yes;Weight Loss;Obesity    Intervention Weight Management: Develop a combined nutrition and exercise program designed to reach desired caloric intake, while maintaining appropriate intake of nutrient and fiber, sodium and fats, and appropriate energy expenditure required for the weight goal.;Weight Management: Provide education and appropriate resources to help participant work on and attain dietary goals.;Weight Management/Obesity: Establish reasonable short term and long term weight goals.;Obesity: Provide education and appropriate resources to help participant work on and attain dietary goals.    Admit Weight 270 lb (122.5 kg)    Goal Weight: Short Term 265 lb (120.2 kg)  Goal Weight: Long Term 230 lb (104.3 kg)    Expected Outcomes Short Term: Continue to assess and modify interventions until short term weight is achieved;Long Term: Adherence to nutrition and physical activity/exercise program aimed toward attainment of established weight goal;Weight Loss: Understanding of general recommendations for a balanced deficit meal plan, which promotes 1-2 lb weight loss per week and includes a negative energy balance of [phone] kcal/d;Understanding recommendations for meals to include 15-35% energy as protein, 25-35% energy from fat, 35-60% energy from carbohydrates, less than 200mg  of dietary cholesterol, 20-35 gm of total fiber daily;Understanding of distribution of calorie intake throughout the day with the consumption of 4-5 meals/snacks    Improve  shortness of breath with ADL's Yes    Intervention Provide education, individualized exercise plan and daily activity instruction to help decrease symptoms of SOB with activities of daily living.    Expected Outcomes Short Term: Improve cardiorespiratory fitness to achieve a reduction of symptoms when performing ADLs;Long Term: Be able to perform more ADLs without symptoms or delay the onset of symptoms    Diabetes Yes    Intervention Provide education about signs/symptoms and action to take for hypo/hyperglycemia.;Provide education about proper nutrition, including hydration, and aerobic/resistive exercise prescription along with prescribed medications to achieve blood glucose in normal ranges: Fasting glucose 65-99 mg/dL    Expected Outcomes Short Term: Participant verbalizes understanding of the signs/symptoms and immediate care of hyper/hypoglycemia, proper foot care and importance of medication, aerobic/resistive exercise and nutrition plan for blood glucose control.;Long Term: Attainment of HbA1C < 7%.    Hypertension Yes    Intervention Provide education on lifestyle modifcations including regular physical activity/exercise, weight management, moderate sodium restriction and increased consumption of fresh fruit, vegetables, and low fat dairy, alcohol moderation, and smoking cessation.;Monitor prescription use compliance.    Expected Outcomes Short Term: Continued assessment and intervention until BP is < 140/28mm HG in hypertensive participants. < 130/35mm HG in hypertensive participants with diabetes, heart failure or chronic kidney disease.;Long Term: Maintenance of blood pressure at goal levels.             Tobacco Use Initial Evaluation: Social History   Tobacco Use  Smoking Status Former   Packs/day: 0.25   Years: 62.00   Total pack years: 15.50   Types: Cigarettes   Quit date: 09/05/2018   Years since quitting: 3.7  Smokeless Tobacco Never    Exercise Goals and Review:   Exercise Goals     Row Name 05/26/22 1618             Exercise Goals   Increase Physical Activity Yes       Intervention Provide advice, education, support and counseling about physical activity/exercise needs.;Develop an individualized exercise prescription for aerobic and resistive training based on initial evaluation findings, risk stratification, comorbidities and participant's personal goals.       Expected Outcomes Short Term: Attend rehab on a regular basis to increase amount of physical activity.;Long Term: Add in home exercise to make exercise part of routine and to increase amount of physical activity.;Long Term: Exercising regularly at least 3-5 days a week.       Increase Strength and Stamina Yes       Intervention Provide advice, education, support and counseling about physical activity/exercise needs.;Develop an individualized exercise prescription for aerobic and resistive training based on initial evaluation findings, risk stratification, comorbidities and participant's personal goals.       Expected Outcomes Short Term: Increase workloads from initial exercise prescription  for resistance, speed, and METs.;Short Term: Perform resistance training exercises routinely during rehab and add in resistance training at home;Long Term: Improve cardiorespiratory fitness, muscular endurance and strength as measured by increased METs and functional capacity (6MWT)       Able to understand and use rate of perceived exertion (RPE) scale Yes       Intervention Provide education and explanation on how to use RPE scale       Expected Outcomes Short Term: Able to use RPE daily in rehab to express subjective intensity level;Long Term:  Able to use RPE to guide intensity level when exercising independently       Able to understand and use Dyspnea scale Yes       Intervention Provide education and explanation on how to use Dyspnea scale       Expected Outcomes Short Term: Able to use Dyspnea scale  daily in rehab to express subjective sense of shortness of breath during exertion;Long Term: Able to use Dyspnea scale to guide intensity level when exercising independently       Knowledge and understanding of Target Heart Rate Range (THRR) Yes       Intervention Provide education and explanation of THRR including how the numbers were predicted and where they are located for reference       Expected Outcomes Short Term: Able to state/look up THRR;Short Term: Able to use daily as guideline for intensity in rehab;Long Term: Able to use THRR to govern intensity when exercising independently       Able to check pulse independently Yes       Intervention Provide education and demonstration on how to check pulse in carotid and radial arteries.;Review the importance of being able to check your own pulse for safety during independent exercise       Expected Outcomes Short Term: Able to explain why pulse checking is important during independent exercise;Long Term: Able to check pulse independently and accurately       Understanding of Exercise Prescription Yes       Intervention Provide education, explanation, and written materials on patient's individual exercise prescription       Expected Outcomes Short Term: Able to explain program exercise prescription;Long Term: Able to explain home exercise prescription to exercise independently                Copy of goals given to participant.

## 2022-06-02 ENCOUNTER — Encounter: Payer: Medicare Other | Attending: Student in an Organized Health Care Education/Training Program | Admitting: *Deleted

## 2022-06-02 ENCOUNTER — Telehealth: Payer: Self-pay | Admitting: Internal Medicine

## 2022-06-02 DIAGNOSIS — J439 Emphysema, unspecified: Secondary | ICD-10-CM | POA: Insufficient documentation

## 2022-06-02 DIAGNOSIS — C3490 Malignant neoplasm of unspecified part of unspecified bronchus or lung: Secondary | ICD-10-CM | POA: Insufficient documentation

## 2022-06-02 LAB — GLUCOSE, CAPILLARY
Glucose-Capillary: 101 mg/dL — ABNORMAL HIGH (ref 70–99)
Glucose-Capillary: 107 mg/dL — ABNORMAL HIGH (ref 70–99)

## 2022-06-02 NOTE — Telephone Encounter (Signed)
Forms obtained from nurse bin for: Levi [name].   The patient has an upcoming office visit with Dr. Caryl Comes on 06/09/22. Will have MD review and sign form at that time.

## 2022-06-02 NOTE — Progress Notes (Signed)
Incomplete Session Note  Patient Details  Name: Levi [name] MRN: [id number] Date of Birth: 21-Aug-1940 Referring Provider:   April Manson Pulmonary Rehab from 05/26/2022 in [facility] Cardiac and Pulmonary Rehab  Referring Provider Armando Reichert MD       [name] did not complete his rehab session.  His CBG of 107  was below the exercise range of 110. He had not eaten anything this morning so was given some crackers. His recheck CBG was 101. Education provided to patient and his wife on importance of eating before exercise to prevent his CBG from dropping.

## 2022-06-02 NOTE — Telephone Encounter (Signed)
Pt's spouse dropped off disability form for placard. Forms placed in nurse box.

## 2022-06-07 ENCOUNTER — Encounter: Payer: Medicare Other | Admitting: *Deleted

## 2022-06-07 DIAGNOSIS — C3490 Malignant neoplasm of unspecified part of unspecified bronchus or lung: Secondary | ICD-10-CM | POA: Diagnosis present

## 2022-06-07 DIAGNOSIS — J439 Emphysema, unspecified: Secondary | ICD-10-CM

## 2022-06-07 LAB — GLUCOSE, CAPILLARY
Glucose-Capillary: 114 mg/dL — ABNORMAL HIGH (ref 70–99)
Glucose-Capillary: 79 mg/dL (ref 70–99)

## 2022-06-07 NOTE — Progress Notes (Signed)
Daily Session Note  Patient Details  Name: Levi [name] MRN: [id number] Date of Birth: 1940-07-31 Referring Provider:   April Manson Pulmonary Rehab from 05/26/2022 in [facility] Cardiac and Pulmonary Rehab  Referring Provider Armando Reichert MD       Encounter Date: 06/07/2022  Check In:  Session Check In - 06/07/22 1042       Check-In   Supervising physician immediately available to respond to emergencies See telemetry face sheet for immediately available ER MD    Location [facility]-Cardiac & Pulmonary Rehab    Staff Present Heath Lark, RN, BSN, CCRP;Jessica Tumalo, MA, RCEP, CCRP, Bertram Gala, MS, ACSM CEP, Exercise Physiologist    Virtual Visit No    Medication changes reported     No    Fall or balance concerns reported    No    Warm-up and Cool-down Performed on first and last piece of equipment    Resistance Training Performed Yes    VAD Patient? No      Pain Assessment   Currently in Pain? No/denies                Social History   Tobacco Use  Smoking Status Former   Packs/day: 0.25   Years: 62.00   Total pack years: 15.50   Types: Cigarettes   Quit date: 09/05/2018   Years since quitting: 3.7  Smokeless Tobacco Never    Goals Met:  Proper associated with RPD/PD & O2 Sat Independence with exercise equipment Exercise tolerated well No report of concerns or symptoms today  Goals Unmet:  Not Applicable  Comments: Pt able to follow exercise prescription today without complaint.  Will continue to monitor for progression.    Dr. Emily Filbert is Medical Director for [place].  Dr. Ottie Glazier is Medical Director for [facility] Pulmonary Rehabilitation.

## 2022-06-09 ENCOUNTER — Ambulatory Visit: Payer: No Typology Code available for payment source | Attending: Internal Medicine | Admitting: Internal Medicine

## 2022-06-09 ENCOUNTER — Encounter: Payer: Self-pay | Admitting: Internal Medicine

## 2022-06-09 ENCOUNTER — Encounter: Payer: Medicare Other | Admitting: *Deleted

## 2022-06-09 VITALS — BP 120/72 | HR 77 | Ht 71.0 in | Wt 273.0 lb

## 2022-06-09 DIAGNOSIS — R0600 Dyspnea, unspecified: Secondary | ICD-10-CM | POA: Diagnosis not present

## 2022-06-09 DIAGNOSIS — I4821 Permanent atrial fibrillation: Secondary | ICD-10-CM

## 2022-06-09 DIAGNOSIS — C3490 Malignant neoplasm of unspecified part of unspecified bronchus or lung: Secondary | ICD-10-CM

## 2022-06-09 DIAGNOSIS — J439 Emphysema, unspecified: Secondary | ICD-10-CM

## 2022-06-09 LAB — GLUCOSE, CAPILLARY
Glucose-Capillary: 144 mg/dL — ABNORMAL HIGH (ref 70–99)
Glucose-Capillary: 91 mg/dL (ref 70–99)

## 2022-06-09 MED ORDER — TORSEMIDE 20 MG PO TABS: 20.0000 mg | ORAL_TABLET | ORAL | 1 refills | Status: DC

## 2022-06-09 NOTE — Progress Notes (Signed)
Daily Session Note  Patient Details  Name: Levi [name] MRN: [id number] Date of Birth: [birth year] Referring Provider:   April Manson Pulmonary Rehab from 05/26/2022 in [facility] Cardiac and Pulmonary Rehab  Referring Provider Armando Reichert MD       Encounter Date: 06/09/2022  Check In:  Session Check In - 06/09/22 0958       Check-In   Supervising physician immediately available to respond to emergencies See telemetry face sheet for immediately available ER MD    Location [facility]-Cardiac & Pulmonary Rehab    Staff Present Darlyne Russian, RN, Lorin Mercy, MS, ACSM CEP, Exercise Physiologist;Noah Tickle, BS, Exercise Physiologist    Virtual Visit No    Medication changes reported     Yes    Comments d/c glimepiride    Fall or balance concerns reported    No    Warm-up and Cool-down Performed on first and last piece of equipment    Resistance Training Performed Yes    VAD Patient? No    PAD/SET Patient? No      Pain Assessment   Currently in Pain? No/denies                Social History   Tobacco Use  Smoking Status Former   Packs/day: 0.25   Years: 62.00   Total pack years: 15.50   Types: Cigarettes   Quit date: 09/05/2018   Years since quitting: 3.7  Smokeless Tobacco Never    Goals Met:  Independence with exercise equipment Exercise tolerated well No report of concerns or symptoms today Strength training completed today  Goals Unmet:  Not Applicable  Comments: Pt able to follow exercise prescription today without complaint.  Will continue to monitor for progression.    Dr. Emily Filbert is Medical Director for [place].  Dr. Ottie Glazier is Medical Director for [facility] Pulmonary Rehabilitation.

## 2022-06-09 NOTE — Progress Notes (Signed)
Patient Care Team: [place] as PCP - General (General Practice) Kate Sable, MD as PCP - Cardiology (Cardiology) Vickie Epley, MD as PCP - Electrophysiology (Cardiology) Telford Nab, RN as Registered Nurse Grayland Ormond, Kathlene November, MD as Consulting Physician (Oncology) Noreene Filbert, MD as Referring Physician (Radiation Oncology) Nestor Lewandowsky, MD (Inactive) as Referring Physician (Cardiothoracic Surgery)   HPI  Levi [name] is a 82 y.o. male seen in followup for atrial fibrillation.  Initially diagnosed 2020 contextually with syncope.  At that time he had been just diagnosed with squamous cell lung cancer for which he has been undergoing chemotherapy and radiation therapy in the context of known COPD.  ECG surveillance had demonstrated sinus rhythm as late as 11/22, importantly because his dyspnea precedes the recurrence of atrial fibrillation  He had met with Dr. Daune Perch regarding treatment options which he felt were limited to dronaderone, cardioversion which failed to hold sinus rhythm.  With its discontinuation he has felt much better.  He feels like he is having less A-fib.    Shortness of breath continues to be problematic almost class IV with shortness of breath 20 feet.  CT scan report was reviewed as noted below.  He comes in today requesting an attempt to restore sinus rhythm using dofetilide to see if it does not help his breathing some.  Some edema.  Has oxygen at home but does not wear it.     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[place])     Prostate enlargement    Shortness of breath    Squamous cell carcinoma of lung, left ([place])    Thyroid disease    Tobacco abuse     Past Surgical History:  Procedure Laterality Date   CARDIOVERSION N/A    CARDIOVERSION N/A 11/24/2021   Procedure: CARDIOVERSION;  Surgeon: [name], MD;  Location: [facility] ORS;  Service: Cardiovascular;  Laterality: N/A;   ELECTROMAGNETIC NAVIGATION BROCHOSCOPY Left 10/19/2018   Procedure: ELECTROMAGNETIC NAVIGATION BRONCHOSCOPY LEFT;  Surgeon: Tyler Pita, MD;  Location: [facility] ORS;  Service: Cardiopulmonary;  Laterality: Left;   ENDOBRONCHIAL ULTRASOUND Left 10/19/2018   Procedure: ENDOBRONCHIAL ULTRASOUND LEFT;  Surgeon: Tyler Pita, MD;  Location: [facility] ORS;  Service: Cardiopulmonary;  Laterality: Left;   MELANOMA EXCISION Left    NO PAST SURGERIES      Current Meds  Medication Sig   albuterol (PROVENTIL HFA;VENTOLIN HFA) 108 (90 Base) MCG/ACT inhaler Inhale 2 puffs into the lungs every 6 (six) hours as needed for wheezing or shortness of breath.   apixaban (ELIQUIS) 5 MG TABS tablet Take 5 mg by mouth 2 (two) times daily.   carboxymethylcellulose (REFRESH PLUS) 0.5 % SOLN 1 drop 4 (four) times daily as needed.   finasteride (PROSCAR) 5 MG tablet Take 5 mg by mouth daily.   fluticasone-salmeterol (ADVAIR) 250-50 MCG/ACT AEPB  Inhale 1 puff into the lungs in the morning and at bedtime.   levothyroxine (SYNTHROID) 200 MCG tablet Take 200 mcg by mouth daily before breakfast.   SEMAGLUTIDE,0.25 OR 0.5MG /DOS, [place] Inject 0.5 mg into the skin once a week.   tamsulosin (FLOMAX) 0.4 MG CAPS capsule 0.8 mg daily.   Tiotropium Bromide Monohydrate (SPIRIVA RESPIMAT) 2.5 MCG/ACT AERS Inhale 2 puffs into the lungs daily.   vitamin B-12 (CYANOCOBALAMIN) 1000 MCG tablet Take 1,000 mcg by mouth daily.    No Known Allergies    Review of Systems negative except from HPI and PMH  Physical Exam BP 120/72 (BP Location: Left Arm, Patient Position:  Sitting, Cuff Size: Large)   Pulse 77   Ht 5\' 11"  (1.803 m)   Wt 273 lb (123.8 kg)   SpO2 94%   BMI 38.08 kg/m  Well developed and nourished in no acute distress HENT normal Neck supple Clear Irregularly Irregular rate and rhythm with controlled  ventricular response  no murmurs or gallops Abd-soft with active BS No Clubbing cyanosis 1-2+ edema Skin-warm and dry A & Oriented  Grossly normal sensory and motor function  ECG atrial fibrillation at 77 Will-/09/38  CrCl cannot be calculated (Patient's most recent lab result is older than the maximum 21 days allowed.).   Assessment and  Plan  Atrial fibrillation-persistent-long-term   Dyspnea on exertion   COPD/emphysema-marked   Lung cancer status post chemo/radiation therapy   Syncope   Renal insufficiency grade 3B   Coronary artery disease   Polycythemia    The patient's dyspnea is very symptomatic.  He would like to see whether restoration of sinus rhythm helps.  I am not sanguine, his BMPs have been repeatedly normal.  His lung exam and imaging studies are markedly abnormal.  As noted above, there was a period time when he was in sinus rhythm that his dyspnea was already significantly limiting.  Still however, even a 5 to 10% improvement in dyspnea might be of benefit to his quality of life.  We have reviewed the risks and benefits.  His renal function is such that he would be borderline this will have to reassess between 125 mcg and 250 mcg at the time of admission.  He is volume overloaded.  I will put him on torsemide 20 mg daily x 3 days MADIT Monday Wednesday Friday.      Current medicines are reviewed at length with the patient today .  The patient does not  have concerns regarding medicines.

## 2022-06-09 NOTE — Patient Instructions (Signed)
Medication Instructions:  Your physician has recommended you make the following change in your medication:   START - torsemide (DEMADEX) 20 MG tablet - Take 1 tablet (20 mg total) by mouth everyday for 3 days then every other day after the first 3 days   *If you need a refill on your cardiac medications before your next appointment, please call your pharmacy*   Lab Work: -None ordered If you have labs (blood work) drawn today and your tests are completely normal, you will receive your results only by: [place] (if you have MyChart) OR A paper copy in the mail If you have any lab test that is abnormal or we need to change your treatment, we will call you to review the results.   Testing/Procedures: Tikosyn (Dofetilide) Hospital Admission   Prior to day of admission:  Check with drug insurance company for cost of drug to ensure affordability --- Dofetilide 500 mcg twice a day.  GoodRx is an option if insurance copay is unaffordable.    No Benadryl is allowed 3 days prior to admission.   Please ensure no missed doses of your anticoagulation (blood thinner) for 3 weeks prior to admission. If a dose is missed please notify our office immediately.   A pharmacist will review all your medications for potential interactions with Tikosyn. If any medication changes are needed prior to admission we will be in touch with you.   If any new medications are started AFTER your admission date is set with Nurse, adult. Please notify our office immediately so your medication list can be updated and reviewed by our pharmacist again.  On day of admission:  Tikosyn initiation requires a 3 night/4 day hospital stay with constant telemetry monitoring. You will have an EKG after each dose of Tikosyn as well as daily lab draws.   If the drug does not convert you to normal rhythm a cardioversion after the 4th dose of Tikosyn.   [facility] office visit on the morning of admission is needed for preliminary  labs/ekg.   Time of admission is dependent on bed availability in the hospital. In some instances, you will be sent home until bed is available. Rarely admission can be delayed to the following day if hospital census prevents available beds.   You may bring personal belongings/clothing with you to the hospital. Please leave your suitcase in the car until you arrive in admissions.   Questions please call our office at [phone]     Follow-Up: At [facility], you and your health needs are our priority.  As part of our continuing mission to provide you with exceptional heart care, we have created designated Provider Care Teams.  These Care Teams include your primary Cardiologist (physician) and Advanced Practice Providers (APPs -  Physician Assistants and Nurse Practitioners) who all work together to provide you with the care you need, when you need it.  We recommend signing up for the patient portal called "MyChart".  Sign up information is provided on this After Visit Summary.  MyChart is used to connect with patients for Virtual Visits (Telemedicine).  Patients are able to view lab/test results, encounter notes, upcoming appointments, etc.  Non-urgent messages can be sent to your provider as well.   To learn more about what you can do with MyChart, go to [email or web address].    Your next appointment:   3 - 4 month(s)  Provider:   Virl Axe, MD    Other Instructions -None

## 2022-06-10 ENCOUNTER — Telehealth: Payer: Self-pay | Admitting: Pharmacist

## 2022-06-10 NOTE — Telephone Encounter (Signed)
Medication list reviewed in anticipation of upcoming Tikosyn initiation. Patient is using albuterol and Advair which can both prolong QTc however are ok to continue. He is not taking any contraindicated medications.   Patient is anticoagulated on Eliquis 5mg  BID on the appropriate dose. Please ensure that patient has not missed any anticoagulation doses in the 3 weeks prior to Tikosyn initiation.   Patient will need to be counseled to avoid use of Benadryl while on Tikosyn and in the 2-3 days prior to Tikosyn initiation.

## 2022-06-14 NOTE — Telephone Encounter (Signed)
Patient seen in the office with Dr. Caryl Comes on 06/09/22 and his completed form was given to him at that visit.

## 2022-06-16 ENCOUNTER — Encounter: Payer: Medicare Other | Admitting: *Deleted

## 2022-06-16 DIAGNOSIS — J439 Emphysema, unspecified: Secondary | ICD-10-CM

## 2022-06-16 NOTE — Progress Notes (Signed)
Daily Session Note  Patient Details  Name: Levi [name] MRN: [id number] Date of Birth: 1940/05/09 Referring Provider:   [facility] from 05/26/2022 in [facility] Cardiac and Pulmonary Rehab  Referring Provider Armando Reichert MD       Encounter Date: 06/16/2022  Check In:  Session Check In - 06/16/22 1004       Check-In   Supervising physician immediately available to respond to emergencies See telemetry face sheet for immediately available ER MD    Location [facility]-Cardiac & Pulmonary Rehab    Staff Present Darlyne Russian, RN, ADN;Jessica Luan Pulling, MA, RCEP, CCRP, Bertram Gala, MS, ACSM CEP, Exercise Physiologist    Virtual Visit No    Medication changes reported     No    Fall or balance concerns reported    No    Warm-up and Cool-down Performed on first and last piece of equipment    Resistance Training Performed Yes    VAD Patient? No    PAD/SET Patient? No      Pain Assessment   Currently in Pain? No/denies                Social History   Tobacco Use  Smoking Status Former   Packs/day: 0.25   Years: 62.00   Total pack years: 15.50   Types: Cigarettes   Quit date: 09/05/2018   Years since quitting: 3.7  Smokeless Tobacco Never    Goals Met:  Independence with exercise equipment Exercise tolerated well No report of concerns or symptoms today Strength training completed today  Goals Unmet:  Not Applicable  Comments: Pt able to follow exercise prescription today without complaint.  Will continue to monitor for progression.    Dr. Emily Filbert is Medical Director for [place].  Dr. Ottie Glazier is Medical Director for [facility] Pulmonary Rehabilitation.

## 2022-06-21 ENCOUNTER — Encounter: Payer: Medicare Other | Admitting: *Deleted

## 2022-06-21 DIAGNOSIS — C3490 Malignant neoplasm of unspecified part of unspecified bronchus or lung: Secondary | ICD-10-CM

## 2022-06-21 DIAGNOSIS — J439 Emphysema, unspecified: Secondary | ICD-10-CM | POA: Diagnosis not present

## 2022-06-21 NOTE — Progress Notes (Signed)
Daily Session Note  Patient Details  Name: Levi [name] MRN: [id number] Date of Birth: 02/26/1941 Referring Provider:   [facility] from 05/26/2022 in [facility] Cardiac and Pulmonary Rehab  Referring Provider Armando Reichert MD       Encounter Date: 06/21/2022  Check In:  Session Check In - 06/21/22 1132       Check-In   Supervising physician immediately available to respond to emergencies See telemetry face sheet for immediately available ER MD    Location [facility] Pul[facility]    Staff Present Nyoka Cowden, RN, BSN, Melina Schools, MS, ACSM CEP, Exercise Physiologist;Jemaine Prokop Luan Pulling, MA, RCEP, CCRP, CCET    Virtual Visit No    Medication changes reported     No    Fall or balance concerns reported    No    Tobacco Cessation No Change    Warm-up and Cool-down Performed on first and last piece of equipment    Resistance Training Performed Yes    VAD Patient? No    PAD/SET Patient? No      Pain Assessment   Currently in Pain? No/denies                Social History   Tobacco Use  Smoking Status Former   Packs/day: 0.25   Years: 62.00   Total pack years: 15.50   Types: Cigarettes   Quit date: 09/05/2018   Years since quitting: 3.7  Smokeless Tobacco Never    Goals Met:  Independence with exercise equipment Exercise tolerated well No report of concerns or symptoms today  Goals Unmet:  Not Applicable  Comments: Pt able to follow exercise prescription today without complaint.  Will continue to monitor for progression.    Dr. Emily Filbert is Medical Director for [place].  Dr. Ottie Glazier is Medical Director for [facility] Pulmonary Rehabilitation.

## 2022-06-22 ENCOUNTER — Encounter: Payer: Self-pay | Admitting: *Deleted

## 2022-06-22 DIAGNOSIS — C3490 Malignant neoplasm of unspecified part of unspecified bronchus or lung: Secondary | ICD-10-CM

## 2022-06-22 DIAGNOSIS — J439 Emphysema, unspecified: Secondary | ICD-10-CM

## 2022-06-22 NOTE — Progress Notes (Signed)
Pulmonary Individual Treatment Plan  Patient Details  Name: Levi [name] MRN: [id number] Date of Birth: 1940-11-04 Referring Provider:   April Manson Pulmonary Rehab from 05/26/2022 in [facility] Cardiac and Pulmonary Rehab  Referring Provider Armando Reichert MD       Initial Encounter Date:  Flowsheet Row Pulmonary Rehab from 05/26/2022 in [facility] Cardiac and Pulmonary Rehab  Date 05/26/22       Visit Diagnosis: Pulmonary emphysema, unspecified emphysema type ([place])  Malignant neoplasm of lung, unspecified laterality, unspecified part of lung ([place])  Patient's Home Medications on Admission:  Current Outpatient Medications:    albuterol (PROVENTIL HFA;VENTOLIN HFA) 108 (90 Base) MCG/ACT inhaler, Inhale 2 puffs into the lungs every 6 (six) hours as needed for wheezing or shortness of breath., Disp: 1 Inhaler, Rfl: 0   apixaban (ELIQUIS) 5 MG TABS tablet, Take 5 mg by mouth 2 (two) times daily., Disp: , Rfl:    carboxymethylcellulose (REFRESH PLUS) 0.5 % SOLN, 1 drop 4 (four) times daily as needed., Disp: , Rfl:    finasteride (PROSCAR) 5 MG tablet, Take 5 mg by mouth daily., Disp: , Rfl:    fluticasone-salmeterol (ADVAIR) 250-50 MCG/ACT AEPB, Inhale 1 puff into the lungs in the morning and at bedtime., Disp: , Rfl:    levothyroxine (SYNTHROID) 200 MCG tablet, Take 200 mcg by mouth daily before breakfast., Disp: , Rfl:    SEMAGLUTIDE,0.25 OR 0.5MG /DOS, Hilda, Inject 0.5 mg into the skin once a week., Disp: , Rfl:    tamsulosin (FLOMAX) 0.4 MG CAPS capsule, 0.8 mg daily., Disp: , Rfl:    Tiotropium Bromide Monohydrate (SPIRIVA RESPIMAT) 2.5 MCG/ACT AERS, Inhale 2 puffs into the lungs daily., Disp: 1 each, Rfl: 11   torsemide (DEMADEX) 20 MG tablet, Take 1 tablet (20 mg total) by mouth every other day., Disp: 45 tablet, Rfl: 1   vitamin B-12 (CYANOCOBALAMIN) 1000 MCG tablet, Take 1,000 mcg by mouth daily., Disp: , Rfl:   Past Medical History: Past Medical History:  Diagnosis Date   Asthma     COPD (chronic obstructive pulmonary disease) ([place])    Hearing loss    Hypertension    Hypothyroidism    Mass of left lung    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[place])    Prostate enlargement    Shortness of breath    Squamous cell carcinoma of lung, left ([facility])    Thyroid disease    Tobacco abuse     Tobacco Use: Social History   Tobacco Use  Smoking Status Former   Packs/day: 0.25   Years: 62.00   Total pack years: 15.50   Types: Cigarettes   Quit date: 09/05/2018   Years since quitting: 3.7  Smokeless Tobacco Never    Labs: Review Flowsheet       Latest Ref Rng & Units 10/15/2018 03/09/2021  Labs for ITP Cardiac and Pulmonary Rehab  Hemoglobin A1c 4.8 - 5.6 % - 7.8   PH, Arterial 7.350 - 7.450 7.42  -  PCO2 arterial 32.0 - 48.0 mmHg 35  -  Bicarbonate 20.0 - 28.0 mmol/L 22.7  -  Acid-base deficit 0.0 - 2.0 mmol/L 1.3  -  O2 Saturation % 93.1  -     Pulmonary Assessment Scores:  Pulmonary Assessment Scores     Row Name 05/26/22 1539         ADL UCSD   ADL Phase Entry     SOB Score total 77     Rest 0     Walk  3     Stairs 4     Bath 3     Dress 3     Shop 3       CAT Score   CAT Score 21       mMRC Score   mMRC Score 2              UCSD: Self-administered rating of dyspnea associated with activities of daily living (ADLs) 6-point scale (0 = "not at all" to 5 = "maximal or unable to do because of breathlessness")  Scoring Scores range from 0 to 120.  Minimally important difference is 5 units  CAT: CAT can identify the health impairment of COPD patients and is better correlated with disease progression.  CAT has a scoring range of zero to 40. The CAT score is classified into four groups of low (less than 10), medium (10 - 20), high (21-30) and very high (31-40) based on the impact level of disease on health status. A CAT score over 10 suggests significant symptoms.  A worsening CAT score could be explained by an exacerbation, poor medication adherence, poor  inhaler technique, or progression of COPD or comorbid conditions.  CAT MCID is 2 points  mMRC: mMRC (Modified Medical Research Council) Dyspnea Scale is used to assess the degree of baseline functional disability in patients of respiratory disease due to dyspnea. No minimal important difference is established. A decrease in score of 1 point or greater is considered a positive change.   Pulmonary Function Assessment:   Exercise Target Goals: Exercise Program Goal: Individual exercise prescription set using results from initial 6 min walk test and THRR while considering  patient's activity barriers and safety.   Exercise Prescription Goal: Initial exercise prescription builds to 30-45 minutes a day of aerobic activity, 2-3 days per week.  Home exercise guidelines will be given to patient during program as part of exercise prescription that the participant will acknowledge.  Education: Aerobic Exercise: - Group verbal and visual presentation on the components of exercise prescription. Introduces F.I.T.T principle from ACSM for exercise prescriptions.  Reviews F.I.T.T. principles of aerobic exercise including progression. Written material given at graduation.   Education: Resistance Exercise: - Group verbal and visual presentation on the components of exercise prescription. Introduces F.I.T.T principle from ACSM for exercise prescriptions  Reviews F.I.T.T. principles of resistance exercise including progression. Written material given at graduation.    Education: Exercise & Equipment Safety: - Individual verbal instruction and demonstration of equipment use and safety with use of the equipment. Flowsheet Row Pulmonary Rehab from 06/16/2022 in [facility] Cardiac and Pulmonary Rehab  Education need identified 05/26/22  Date 05/26/22  Educator KW  Instruction Review Code 1- Verbalizes Understanding       Education: Exercise Physiology & General Exercise Guidelines: - Group verbal and written  instruction with models to review the exercise physiology of the cardiovascular system and associated critical values. Provides general exercise guidelines with specific guidelines to those with heart or lung disease.    Education: Flexibility, Balance, Mind/Body Relaxation: - Group verbal and visual presentation with interactive activity on the components of exercise prescription. Introduces F.I.T.T principle from ACSM for exercise prescriptions. Reviews F.I.T.T. principles of flexibility and balance exercise training including progression. Also discusses the mind body connection.  Reviews various relaxation techniques to help reduce and manage stress (i.e. Deep breathing, progressive muscle relaxation, and visualization). Balance handout provided to take home. Written material given at graduation. Flowsheet Row Pulmonary Rehab from 10/08/2020 in [facility] Cardiac  and Pulmonary Rehab  Date 09/03/20  Educator AS  Instruction Review Code 1- Verbalizes Understanding       Activity Barriers & Risk Stratification:  Activity Barriers & Cardiac Risk Stratification - 05/26/22 1552       Activity Barriers & Cardiac Risk Stratification   Activity Barriers Deconditioning;Shortness of Breath;Muscular Weakness;Back Problems    Comments Sciatica             6 Minute Walk:  6 Minute Walk     Row Name 05/26/22 1554         6 Minute Walk   Phase Initial     Distance 890 feet     Walk Time 6 minutes     # of Rest Breaks 0     MPH 1.68     METS 1.15     RPE 13     Perceived Dyspnea  3     VO2 Peak 4.03     Symptoms Yes (comment)     Comments Back pain caused from sciatica 9/10, SOB     Resting HR 66 bpm     Resting BP 140/76     Resting Oxygen Saturation  93 %     Exercise Oxygen Saturation  during 6 min walk 86 %     Max Ex. HR 94 bpm     Max Ex. BP 148/74     2 Minute Post BP 136/76       Interval HR   1 Minute HR 81     2 Minute HR 91     3 Minute HR 94     4 Minute HR 94     5  Minute HR 91     6 Minute HR 90     2 Minute Post HR 93     Interval Heart Rate? Yes       Interval Oxygen   Interval Oxygen? Yes     Baseline Oxygen Saturation % 93 %     1 Minute Oxygen Saturation % 91 %     1 Minute Liters of Oxygen 0 L  RA     2 Minute Oxygen Saturation % 88 %     2 Minute Liters of Oxygen 0 L     3 Minute Oxygen Saturation % 87 %     3 Minute Liters of Oxygen 0 L     4 Minute Oxygen Saturation % 88 %     4 Minute Liters of Oxygen 0 L     5 Minute Oxygen Saturation % 87 %     5 Minute Liters of Oxygen 0 L     6 Minute Oxygen Saturation % 86 %     6 Minute Liters of Oxygen 0 L     2 Minute Post Oxygen Saturation % 93 %     2 Minute Post Liters of Oxygen 0 L             Oxygen Initial Assessment:  Oxygen Initial Assessment - 05/26/22 1538       Home Oxygen   Home Oxygen Device None   prn only- does not use currently   Sleep Oxygen Prescription None    Home Exercise Oxygen Prescription None    Home Resting Oxygen Prescription None    Compliance with Home Oxygen Use Yes   prn     Initial 6 min Walk   Oxygen Used None      Program Oxygen Prescription  Program Oxygen Prescription None   prn     Intervention   Short Term Goals To learn and understand importance of monitoring SPO2 with pulse oximeter and demonstrate accurate use of the pulse oximeter.;To learn and understand importance of maintaining oxygen saturations>88%;To learn and demonstrate proper pursed lip breathing techniques or other breathing techniques. ;To learn and demonstrate proper use of respiratory medications;To learn and exhibit compliance with exercise, home and travel O2 prescription    Long  Term Goals Maintenance of O2 saturations>88%;Exhibits proper breathing techniques, such as pursed lip breathing or other method taught during program session;Exhibits compliance with exercise, home  and travel O2 prescription;Compliance with respiratory medication;Demonstrates proper use of  MDI's;Verbalizes importance of monitoring SPO2 with pulse oximeter and return demonstration             Oxygen Re-Evaluation:  Oxygen Re-Evaluation     Row Name 06/16/22 1024             Program Oxygen Prescription   Program Oxygen Prescription None         Home Oxygen   Home Oxygen Device None       Sleep Oxygen Prescription None       Home Exercise Oxygen Prescription None       Home Resting Oxygen Prescription None       Compliance with Home Oxygen Use Yes         Goals/Expected Outcomes   Short Term Goals To learn and understand importance of monitoring SPO2 with pulse oximeter and demonstrate accurate use of the pulse oximeter.;To learn and understand importance of maintaining oxygen saturations>88%;To learn and demonstrate proper pursed lip breathing techniques or other breathing techniques. ;To learn and demonstrate proper use of respiratory medications;To learn and exhibit compliance with exercise, home and travel O2 prescription       Long  Term Goals Maintenance of O2 saturations>88%;Exhibits proper breathing techniques, such as pursed lip breathing or other method taught during program session;Exhibits compliance with exercise, home  and travel O2 prescription;Compliance with respiratory medication;Demonstrates proper use of MDI's;Verbalizes importance of monitoring SPO2 with pulse oximeter and return demonstration       Comments Lemon went to the [place] yesterday where they did a stress test to assess his breathing. [facility] will [facility]. We also reviewed PLB with the patient.       Goals/Expected Outcomes Short: Receive results to stress test. Long: Become proficient with PLB.                Oxygen Discharge (Final Oxygen Re-Evaluation):  Oxygen Re-Evaluation - 06/16/22 1024       Program Oxygen Prescription   Program Oxygen Prescription None      Home Oxygen   Home Oxygen Device None    Sleep Oxygen Prescription None     Home Exercise Oxygen Prescription None    Home Resting Oxygen Prescription None    Compliance with Home Oxygen Use Yes      Goals/Expected Outcomes   Short Term Goals To learn and understand importance of monitoring SPO2 with pulse oximeter and demonstrate accurate use of the pulse oximeter.;To learn and understand importance of maintaining oxygen saturations>88%;To learn and demonstrate proper pursed lip breathing techniques or other breathing techniques. ;To learn and demonstrate proper use of respiratory medications;To learn and exhibit compliance with exercise, home and travel O2 prescription    Long  Term Goals Maintenance of O2 saturations>88%;Exhibits proper breathing techniques, such as pursed lip  breathing or other method taught during program session;Exhibits compliance with exercise, home  and travel O2 prescription;Compliance with respiratory medication;Demonstrates proper use of MDI's;Verbalizes importance of monitoring SPO2 with pulse oximeter and return demonstration    Comments Tyrone went to the [place] yesterday where they did a stress test to assess his breathing. We will [facility] to [facility]. We also reviewed PLB with the patient.    Goals/Expected Outcomes Short: Receive results to stress test. Long: Become proficient with PLB.             Initial Exercise Prescription:  Initial Exercise Prescription - 05/26/22 1600       Date of Initial Exercise RX and Referring Provider   Date 05/26/22    Referring Provider Armando Reichert MD      Oxygen   Maintain Oxygen Saturation 88% or higher      Recumbant Bike   Level 1    RPM 60    Watts 15    Minutes 15    METs 1.1      T5 Nustep   Level 1    SPM 80    Minutes 15    METs 1.1      Biostep-RELP   Level 1    SPM 50    Minutes 15    METs 1.1      Track   Laps 17    Minutes 15    METs 1.92      Prescription Details   Frequency (times per week) 2    Duration Progress to 30 minutes of  continuous aerobic without signs/symptoms of physical distress      Intensity   THRR 40-80% of Max Heartrate 96 - 126    Ratings of Perceived Exertion 11-13    Perceived Dyspnea 0-4      Progression   Progression Continue to progress workloads to maintain intensity without signs/symptoms of physical distress.      Resistance Training   Training Prescription Yes    Weight 4 lb    Reps 10-15             Perform Capillary Blood Glucose checks as needed.  Exercise Prescription Changes:   Exercise Prescription Changes     Row Name 05/26/22 1600 06/14/22 1400           Response to Exercise   Blood Pressure (Admit) 140/76 134/68      Blood Pressure (Exercise) 148/74 152/72      Blood Pressure (Exit) 134/72 130/64      Heart Rate (Admit) 66 bpm 68 bpm      Heart Rate (Exercise) 94 bpm 96 bpm      Heart Rate (Exit) 73 bpm 90 bpm      Oxygen Saturation (Admit) 93 % 93 %      Oxygen Saturation (Exercise) 86 % 88 %      Oxygen Saturation (Exit) 93 % 91 %      Rating of Perceived Exertion (Exercise) 13 15      Perceived Dyspnea (Exercise) 3 3      Symptoms Back pain 9/10, SOB none      Comments walk test results First two full days of exercise      Duration -- Progress to 30 minutes of  aerobic without signs/symptoms of physical distress      Intensity -- THRR unchanged        Progression   Progression -- Continue to progress workloads to  maintain intensity without signs/symptoms of physical distress.      Average METs -- 1.94        Resistance Training   Training Prescription -- Yes      Weight -- 4 lb      Reps -- 10-15        Interval Training   Interval Training -- No        Oxygen   Oxygen -- Continuous        Recumbant Bike   Level -- 1      Watts -- 15      Minutes -- 15        T5 Nustep   Level -- 2      Minutes -- 15      METs -- 1.8        Biostep-RELP   Level -- 2      Minutes -- 15      METs -- 2        Track   Laps -- 19      Minutes --  15      METs -- 2.03        Oxygen   Maintain Oxygen Saturation -- 88% or higher               Exercise Comments:   Exercise Goals and Review:   Exercise Goals     Row Name 05/26/22 1618             Exercise Goals   Increase Physical Activity Yes       Intervention Provide advice, education, support and counseling about physical activity/exercise needs.;Develop an individualized exercise prescription for aerobic and resistive training based on initial evaluation findings, risk stratification, comorbidities and participant's personal goals.       Expected Outcomes Short Term: Attend rehab on a regular basis to increase amount of physical activity.;Long Term: Add in home exercise to make exercise part of routine and to increase amount of physical activity.;Long Term: Exercising regularly at least 3-5 days a week.       Increase Strength and Stamina Yes       Intervention Provide advice, education, support and counseling about physical activity/exercise needs.;Develop an individualized exercise prescription for aerobic and resistive training based on initial evaluation findings, risk stratification, comorbidities and participant's personal goals.       Expected Outcomes Short Term: Increase workloads from initial exercise prescription for resistance, speed, and METs.;Short Term: Perform resistance training exercises routinely during rehab and add in resistance training at home;Long Term: Improve cardiorespiratory fitness, muscular endurance and strength as measured by increased METs and functional capacity (6MWT)       Able to understand and use rate of perceived exertion (RPE) scale Yes       Intervention Provide education and explanation on how to use RPE scale       Expected Outcomes Short Term: Able to use RPE daily in rehab to express subjective intensity level;Long Term:  Able to use RPE to guide intensity level when exercising independently       Able to understand and use  Dyspnea scale Yes       Intervention Provide education and explanation on how to use Dyspnea scale       Expected Outcomes Short Term: Able to use Dyspnea scale daily in rehab to express subjective sense of shortness of breath during exertion;Long Term: Able to use Dyspnea scale to guide intensity level when exercising independently  Knowledge and understanding of Target Heart Rate Range (THRR) Yes       Intervention Provide education and explanation of THRR including how the numbers were predicted and where they are located for reference       Expected Outcomes Short Term: Able to state/look up THRR;Short Term: Able to use daily as guideline for intensity in rehab;Long Term: Able to use THRR to govern intensity when exercising independently       Able to check pulse independently Yes       Intervention Provide education and demonstration on how to check pulse in carotid and radial arteries.;Review the importance of being able to check your own pulse for safety during independent exercise       Expected Outcomes Short Term: Able to explain why pulse checking is important during independent exercise;Long Term: Able to check pulse independently and accurately       Understanding of Exercise Prescription Yes       Intervention Provide education, explanation, and written materials on patient's individual exercise prescription       Expected Outcomes Short Term: Able to explain program exercise prescription;Long Term: Able to explain home exercise prescription to exercise independently                Exercise Goals Re-Evaluation :  Exercise Goals Re-Evaluation     Row Name 06/14/22 1505 06/16/22 1002           Exercise Goal Re-Evaluation   Exercise Goals Review Increase Physical Activity;Increase Strength and Stamina;Understanding of Exercise Prescription Increase Physical Activity;Increase Strength and Stamina;Understanding of Exercise Prescription      Comments Yotam is off to a good  start in the program. He had an average MET level of 1.94 METs during his first two sessions in the program. He also was able to work at level 2 on the biostep and T4 Nustep. He walked up to 19 laps on the track as well. We will continue to monitor his progress in the program. Quaran states that he feels he is doing well in the program. He states that he is able to move a lot better since starting the program. This is the patients second time in the program and he states that he knows the program is beneficial, so he looks forward to the long term benefits of staying consistent with his exercise in the program. Patient states that he has been doing some resistance training with hand weights at home on his days away from rehab. He was encouraged to begin walking some on his days away from rehab. He stated that he and his wife plan to join the [facility] to begin exercising together. We will continue to monitor his progress in the program.      Expected Outcomes Short: Continue to follow current exercise prescription. Long: Continue to improve strength and stamina. Short: Begin going to the [employer] to exercise on days away from rehab. Long: Continue to improve strength and stamina.               Discharge Exercise Prescription (Final Exercise Prescription Changes):  Exercise Prescription Changes - 06/14/22 1400       Response to Exercise   Blood Pressure (Admit) 134/68    Blood Pressure (Exercise) 152/72    Blood Pressure (Exit) 130/64    Heart Rate (Admit) 68 bpm    Heart Rate (Exercise) 96 bpm    Heart Rate (Exit) 90 bpm    Oxygen Saturation (Admit) 93 %  Oxygen Saturation (Exercise) 88 %    Oxygen Saturation (Exit) 91 %    Rating of Perceived Exertion (Exercise) 15    Perceived Dyspnea (Exercise) 3    Symptoms none    Comments First two full days of exercise    Duration Progress to 30 minutes of  aerobic without signs/symptoms of physical distress    Intensity THRR unchanged      Progression    Progression Continue to progress workloads to maintain intensity without signs/symptoms of physical distress.    Average METs 1.94      Resistance Training   Training Prescription Yes    Weight 4 lb    Reps 10-15      Interval Training   Interval Training No      Oxygen   Oxygen Continuous      Recumbant Bike   Level 1    Watts 15    Minutes 15      T5 Nustep   Level 2    Minutes 15    METs 1.8      Biostep-RELP   Level 2    Minutes 15    METs 2      Track   Laps 19    Minutes 15    METs 2.03      Oxygen   Maintain Oxygen Saturation 88% or higher             Nutrition:  Target Goals: Understanding of nutrition guidelines, daily intake of sodium 1500mg , cholesterol 200mg , calories 30% from fat and 7% or less from saturated fats, daily to have 5 or more servings of fruits and vegetables.  Education: All About Nutrition: -Group instruction provided by verbal, written material, interactive activities, discussions, models, and posters to present general guidelines for heart healthy nutrition including fat, fiber, MyPlate, the role of sodium in heart healthy nutrition, utilization of the nutrition label, and utilization of this knowledge for meal planning. Follow up email sent as well. Written material given at graduation. Flowsheet Row Pulmonary Rehab from 10/08/2020 in [facility] Cardiac and Pulmonary Rehab  Date 09/10/20  Educator [facility]  Instruction Review Code 1- Verbalizes Understanding       Biometrics:  Pre Biometrics - 05/26/22 1553       Pre Biometrics   Height 5\' 11"  (1.803 m)    Weight 270 lb 8 oz (122.7 kg)    Waist Circumference 55 inches    Hip Circumference 48.5 inches    Waist to Hip Ratio 1.13 %    BMI (Calculated) 37.74    Single Leg Stand 3.2 seconds              Nutrition Therapy Plan and Nutrition Goals:  Nutrition Therapy & Goals - 05/26/22 1547       Nutrition Therapy   RD appointment deferred Yes   Deferred      Intervention Plan   Intervention Prescribe, educate and counsel regarding individualized specific dietary modifications aiming towards targeted core components such as weight, hypertension, lipid management, diabetes, heart failure and other comorbidities.    Expected Outcomes Short Term Goal: Understand basic principles of dietary content, such as calories, fat, sodium, cholesterol and nutrients.;Short Term Goal: A plan has been developed with personal nutrition goals set during dietitian appointment.;Long Term Goal: Adherence to prescribed nutrition plan.             Nutrition Assessments:  MEDIFICTS Score Key: ?70 Need to make dietary changes  40-70 Heart Healthy Diet ?  40 Therapeutic Level Cholesterol Diet  Flowsheet Row Pulmonary Rehab from 05/26/2022 in [facility] Cardiac and Pulmonary Rehab  Picture Your Plate Total Score on Admission 59      Picture Your Plate Scores: <02 Unhealthy dietary pattern with much room for improvement. 41-50 Dietary pattern unlikely to meet recommendations for good health and room for improvement. 51-60 More healthful dietary pattern, with some room for improvement.  >60 Healthy dietary pattern, although there may be some specific behaviors that could be improved.   Nutrition Goals Re-Evaluation:  Nutrition Goals Re-Evaluation     Row Name 06/16/22 1014             Goals   Comment Elye denies wanting to meet with the RD at this time. However, he states that his wife has helped him work on healthy eating. They attended the nutrition education classes the last time he was in the program and have implemented the things that they learned.       Expected Outcome Continue to work on healthy eating.                Nutrition Goals Discharge (Final Nutrition Goals Re-Evaluation):  Nutrition Goals Re-Evaluation - 06/16/22 1014       Goals   Comment Deepak denies wanting to meet with the RD at this time. However, he states that his wife has  helped him work on healthy eating. They attended the nutrition education classes the last time he was in the program and have implemented the things that they learned.    Expected Outcome Continue to work on healthy eating.             Psychosocial: Target Goals: Acknowledge presence or absence of significant depression and/or stress, maximize coping skills, provide positive support system. Participant is able to verbalize types and ability to use techniques and skills needed for reducing stress and depression.   Education: Stress, Anxiety, and Depression - Group verbal and visual presentation to define topics covered.  Reviews how body is impacted by stress, anxiety, and depression.  Also discusses healthy ways to reduce stress and to treat/manage anxiety and depression.  Written material given at graduation. Flowsheet Row Pulmonary Rehab from 10/08/2020 in [facility] Cardiac and Pulmonary Rehab  Date 10/08/20  Educator [facility]  Instruction Review Code 1- [employer] Understanding       Education: Sleep Hygiene -Provides group verbal and written instruction about how sleep can affect your health.  Define sleep hygiene, discuss sleep cycles and impact of sleep habits. Review good sleep hygiene tips.    Initial Review & Psychosocial Screening:  Initial Psych Review & Screening - 05/17/22 1410       Initial Review   Current issues with Current Stress Concerns    Source of Stress Concerns Unable to participate in former interests or hobbies;Unable to perform yard/household activities;Chronic Illness      Family Dynamics   Good Support System? Yes   wife     Barriers   Psychosocial barriers to participate in program There are no identifiable barriers or psychosocial needs.;The patient should benefit from training in stress management and relaxation.      Screening Interventions   Interventions Encouraged to exercise;Provide feedback about the scores to participant;To provide support and  resources with identified psychosocial needs    Expected Outcomes Short Term goal: Utilizing psychosocial counselor, staff and physician to assist with identification of specific Stressors or current issues interfering with healing process. Setting desired goal for each stressor or  current issue identified.;Long Term Goal: Stressors or current issues are controlled or eliminated.;Short Term goal: Identification and review with participant of any Quality of Life or Depression concerns found by scoring the questionnaire.;Long Term goal: The participant improves quality of Life and PHQ9 Scores as seen by post scores and/or verbalization of changes             Quality of Life Scores:  Scores of 19 and below usually indicate a poorer quality of life in these areas.  A difference of  2-3 points is a clinically meaningful difference.  A difference of 2-3 points in the total score of the Quality of Life Index has been associated with significant improvement in overall quality of life, self-image, physical symptoms, and general health in studies assessing change in quality of life.  PHQ-9: Review Flowsheet  More data exists      05/26/2022 01/26/2021 08/31/2020 10/20/2015 10/16/2015  Depression screen PHQ 2/9  Decreased Interest 1 1 1  0 0  Down, Depressed, Hopeless 0 0 0 0 0  PHQ - 2 Score 1 1 1  0 0  Altered sleeping 0 1 0 - -  Tired, decreased energy 3 2 1  - -  Change in appetite 0 2 0 - -  Feeling bad or failure about yourself  0 0 0 - -  Trouble concentrating 0 0 0 - -  Moving slowly or fidgety/restless 0 0 0 - -  Suicidal thoughts 0 0 0 - -  PHQ-9 Score 4 6 2  - -  Difficult doing work/chores Not difficult at all Somewhat difficult Not difficult at all - -   Interpretation of Total Score  Total Score Depression Severity:  1-4 = Minimal depression, 5-9 = Mild depression, 10-14 = Moderate depression, 15-19 = Moderately severe depression, 20-27 = Severe depression   Psychosocial Evaluation and  Intervention:  Psychosocial Evaluation - 05/17/22 1416       Psychosocial Evaluation & Interventions   Interventions Encouraged to exercise with the program and follow exercise prescription    Comments Nell 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 He has done the program before and is familiar with what to expect. 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 His wife is his main support system and is very involved in his health care. She states that his doctor wants him to lose weight and thinks that will help with his breathing and afib. She is concerned about his stamina. He mentioned she probably knows more about his health than he does. He does want to increase his stamina and feel stronger while improving his breathing. His health journey has been rough these last few years and he wants to focus on feeling better.    Expected Outcomes Short: attend pulmonary rehab for education and exercise. Long: Develop and maintain positive self care habits.    Continue Psychosocial Services  Follow up required by staff             Psychosocial Re-Evaluation:  Psychosocial Re-Evaluation     [place] Name 06/16/22 1010             Psychosocial Re-Evaluation   Current issues with Current Stress Concerns       Comments Marvens is doing well mentally. He denies any depression or anxiety at this time. He does state that he experiences some stress due to his health concerns. He does not like being unable to do the things he wants to do. He reports  sleeping well at this time. He reports having a good support system at home made up by his whole family. He does state that coming to rehab has helped his mental state because he knows he is working towards getting better.       Expected Outcomes Short: Continue to exercise for mental boost. Long: Maintain positive outlook.       Interventions Encouraged to attend Pulmonary Rehabilitation for the exercise       Continue  Psychosocial Services  Follow up required by staff         Initial Review   Source of Stress Concerns Unable to participate in former interests or hobbies;Unable to perform yard/household activities;Chronic Illness                Psychosocial Discharge (Final Psychosocial Re-Evaluation):  Psychosocial Re-Evaluation - 06/16/22 1010       Psychosocial Re-Evaluation   Current issues with Current Stress Concerns    Comments Berish is doing well mentally. He denies any depression or anxiety at this time. He does state that he experiences some stress due to his health concerns. He does not like being unable to do the things he wants to do. He reports sleeping well at this time. He reports having a good support system at home made up by his whole family. He does state that coming to rehab has helped his mental state because he knows he is working towards getting better.    Expected Outcomes Short: Continue to exercise for mental boost. Long: Maintain positive outlook.    Interventions Encouraged to attend Pulmonary Rehabilitation for the exercise    Continue Psychosocial Services  Follow up required by staff      Initial Review   Source of Stress Concerns Unable to participate in former interests or hobbies;Unable to perform yard/household activities;Chronic Illness             Education: Education Goals: Education classes will be provided on a weekly basis, covering required topics. Participant will state understanding/return demonstration of topics presented.  Learning Barriers/Preferences:  Learning Barriers/Preferences - 05/17/22 1408       Learning Barriers/Preferences   Learning Barriers Hearing    Learning Preferences None             General Pulmonary Education Topics:  Infection Prevention: - Provides verbal and written material to individual with discussion of infection control including proper hand washing and proper equipment cleaning during exercise  session. Flowsheet Row Pulmonary Rehab from 06/16/2022 in [facility] Cardiac and Pulmonary Rehab  Education need identified 05/26/22  Date 05/26/22  Educator KW  Instruction Review Code 1- Verbalizes Understanding       Falls Prevention: - Provides verbal and written material to individual with discussion of falls prevention and safety. Flowsheet Row Pulmonary Rehab from 06/16/2022 in [facility] Cardiac and Pulmonary Rehab  Education need identified 05/26/22  Date 05/26/22  Educator KW  Instruction Review Code 1- Verbalizes Understanding       Chronic Lung Disease Review: - Group verbal instruction with posters, models, PowerPoint presentations and videos,  to review new updates, new respiratory medications, new advancements in procedures and treatments. Providing information on websites and "800" numbers for continued self-education. Includes information about supplement oxygen, available portable oxygen systems, continuous and intermittent flow rates, oxygen safety, concentrators, and Medicare reimbursement for oxygen. Explanation of Pulmonary Drugs, including class, frequency, complications, importance of spacers, rinsing mouth after steroid MDI's, and proper cleaning methods for nebulizers. Review of basic lung anatomy  and physiology related to function, structure, and complications of lung disease. Review of risk factors. Discussion about methods for diagnosing sleep apnea and types of masks and machines for OSA. Includes a review of the use of types of environmental controls: home humidity, furnaces, filters, dust mite/pet prevention, HEPA vacuums. Discussion about weather changes, air quality and the benefits of nasal washing. Instruction on Warning signs, infection symptoms, calling MD promptly, preventive modes, and value of vaccinations. Review of effective airway clearance, coughing and/or vibration techniques. Emphasizing that all should Create an Action Plan. Written material given at  graduation. Flowsheet Row Pulmonary Rehab from 06/16/2022 in [facility] Cardiac and Pulmonary Rehab  Education need identified 05/26/22       AED/CPR: - Group verbal and written instruction with the use of models to demonstrate the basic use of the AED with the basic ABC's of resuscitation.    Anatomy and Cardiac Procedures: - Group verbal and visual presentation and models provide information about basic cardiac anatomy and function. Reviews the testing methods done to diagnose heart disease and the outcomes of the test results. Describes the treatment choices: Medical Management, Angioplasty, or Coronary Bypass Surgery for treating various heart conditions including Myocardial Infarction, Angina, Valve Disease, and Cardiac Arrhythmias.  Written material given at graduation.   Medication Safety: - Group verbal and visual instruction to review commonly prescribed medications for heart and lung disease. Reviews the medication, class of the drug, and side effects. Includes the steps to properly store meds and maintain the prescription regimen.  Written material given at graduation. Flowsheet Row Pulmonary Rehab from 10/08/2020 in [facility] Cardiac and Pulmonary Rehab  Date 09/17/20  Educator SB  Instruction Review Code 1- Verbalizes Understanding       Other: -Provides group and verbal instruction on various topics (see comments)   Knowledge Questionnaire Score:  Knowledge Questionnaire Score - 05/26/22 1536       Knowledge Questionnaire Score   Pre Score 17/18              Core Components/Risk Factors/Patient Goals at Admission:  Personal Goals and Risk Factors at Admission - 05/26/22 1618       Core Components/Risk Factors/Patient Goals on Admission    Weight Management Yes;Weight Loss;Obesity    Intervention Weight Management: Develop a combined nutrition and exercise program designed to reach desired caloric intake, while maintaining appropriate intake of nutrient and fiber,  sodium and fats, and appropriate energy expenditure required for the weight goal.;Weight Management: Provide education and appropriate resources to help participant work on and attain dietary goals.;Weight Management/Obesity: Establish reasonable short term and long term weight goals.;Obesity: Provide education and appropriate resources to help participant work on and attain dietary goals.    Admit Weight 270 lb (122.5 kg)    Goal Weight: Short Term 265 lb (120.2 kg)    Goal Weight: Long Term 230 lb (104.3 kg)    Expected Outcomes Short Term: Continue to assess and modify interventions until short term weight is achieved;Long Term: Adherence to nutrition and physical activity/exercise program aimed toward attainment of established weight goal;Weight Loss: Understanding of general recommendations for a balanced deficit meal plan, which promotes 1-2 lb weight loss per week and includes a negative energy balance of [id number] kcal/d;Understanding recommendations for meals to include 15-35% energy as protein, 25-35% energy from fat, 35-60% energy from carbohydrates, less than 200mg  of dietary cholesterol, 20-35 gm of total fiber daily;Understanding of distribution of calorie intake throughout the day with the consumption of 4-5 meals/snacks  Improve shortness of breath with ADL's Yes    Intervention Provide education, individualized exercise plan and daily activity instruction to help decrease symptoms of SOB with activities of daily living.    Expected Outcomes Short Term: Improve cardiorespiratory fitness to achieve a reduction of symptoms when performing ADLs;Long Term: Be able to perform more ADLs without symptoms or delay the onset of symptoms    Diabetes Yes    Intervention Provide education about signs/symptoms and action to take for hypo/hyperglycemia.;Provide education about proper nutrition, including hydration, and aerobic/resistive exercise prescription along with prescribed medications to achieve  blood glucose in normal ranges: Fasting glucose 65-99 mg/dL    Expected Outcomes Short Term: Participant verbalizes understanding of the signs/symptoms and immediate care of hyper/hypoglycemia, proper foot care and importance of medication, aerobic/resistive exercise and nutrition plan for blood glucose control.;Long Term: Attainment of HbA1C < 7%.    Hypertension Yes    Intervention Provide education on lifestyle modifcations including regular physical activity/exercise, weight management, moderate sodium restriction and increased consumption of fresh fruit, vegetables, and low fat dairy, alcohol moderation, and smoking cessation.;Monitor prescription use compliance.    Expected Outcomes Short Term: Continued assessment and intervention until BP is < 140/62mm HG in hypertensive participants. < 130/1mm HG in hypertensive participants with diabetes, heart failure or chronic kidney disease.;Long Term: Maintenance of blood pressure at goal levels.             Education:Diabetes - Individual verbal and written instruction to review signs/symptoms of diabetes, desired ranges of glucose level fasting, after meals and with exercise. Acknowledge that pre and post exercise glucose checks will be done for 3 sessions at entry of program. Flowsheet Row Pulmonary Rehab from 05/17/2022 in [facility] Cardiac and Pulmonary Rehab  Date 05/17/22  Educator [facility]  Instruction Review Code 1- Verbalizes Understanding       Know Your Numbers and Heart Failure: - Group verbal and visual instruction to discuss disease risk factors for cardiac and pulmonary disease and treatment options.  Reviews associated critical values for Overweight/Obesity, Hypertension, Cholesterol, and Diabetes.  Discusses basics of heart failure: signs/symptoms and treatments.  Introduces Heart Failure Zone chart for action plan for heart failure.  Written material given at graduation. Flowsheet Row Pulmonary Rehab from 10/08/2020 in [facility] Cardiac and  Pulmonary Rehab  Date 09/24/20  Educator SB  Instruction Review Code 1- Verbalizes Understanding       Core Components/Risk Factors/Patient Goals Review:   Goals and Risk Factor Review     Row Name 06/16/22 1016             Core Components/Risk Factors/Patient Goals Review   Personal Goals Review Weight Management/Obesity;Diabetes;Hypertension       Review Sriram states that he would like to lose a little weight. He is currently at 267.1 lbs but would like to get down to around 230 lbs. Aarik has been checking his blood sugars at home around once a week and states that they have been within normal ranges. He also reports checking his BP at home and states that it has been within normal ranges as well.       Expected Outcomes Short: Continue to work towards weight goal. Long: Continue to monitor lifestyle risk factors.                Core Components/Risk Factors/Patient Goals at Discharge (Final Review):   Goals and Risk Factor Review - 06/16/22 1016       Core Components/Risk Factors/Patient Goals Review  Personal Goals Review Weight Management/Obesity;Diabetes;Hypertension    Review Mehdi states that he would like to lose a little weight. He is currently at 267.1 lbs but would like to get down to around 230 lbs. Parish has been checking his blood sugars at home around once a week and states that they have been within normal ranges. He also reports checking his BP at home and states that it has been within normal ranges as well.    Expected Outcomes Short: Continue to work towards weight goal. Long: Continue to monitor lifestyle risk factors.             ITP Comments:  ITP Comments     Row Name 05/17/22 1417 05/26/22 1611 06/22/22 1253       ITP Comments Initial phone call completed. Diagnosis can be found in [facility] 1/11. EP Orientation scheduled for Thursday 1/18 at 2pm. Completed 6MWT and gym orientation. Initial ITP created and sent for review to Dr. Ottie Glazier, , Medical Director. 30 day review completed. ITP sent to Dr. Zetta Bills, Medical Director of  Pulmonary Rehab. Continue with ITP unless changes are made by physician.              Comments: 30 day review

## 2022-06-23 ENCOUNTER — Encounter: Payer: Medicare Other | Admitting: *Deleted

## 2022-06-23 DIAGNOSIS — C3490 Malignant neoplasm of unspecified part of unspecified bronchus or lung: Secondary | ICD-10-CM

## 2022-06-23 DIAGNOSIS — J439 Emphysema, unspecified: Secondary | ICD-10-CM

## 2022-06-23 NOTE — Progress Notes (Signed)
Daily Session Note  Patient Details  Name: Levi [name] MRN: [id number] Date of Birth: [birth year] Referring Provider:   April Manson Pulmonary Rehab from 05/26/2022 in [facility] Cardiac and Pulmonary Rehab  Referring Provider Armando Reichert MD       Encounter Date: 06/23/2022  Check In:  Session Check In - 06/23/22 1020       Check-In   Supervising physician immediately available to respond to emergencies See telemetry face sheet for immediately available ER MD    Location [facility]-Cardiac & Pulmonary Rehab    Staff Present Darlyne Russian, RN, ADN;Jessica Luan Pulling, MA, RCEP, CCRP, [name] Gala, MS, ACSM CEP, Exercise Physiologist    Virtual Visit No    Medication changes reported     No    Fall or balance concerns reported    No    Warm-up and Cool-down Performed on first and last piece of equipment    Resistance Training Performed Yes    VAD Patient? No    PAD/SET Patient? No      Pain Assessment   Currently in Pain? No/denies                Social History   Tobacco Use  Smoking Status Former   Packs/day: 0.25   Years: 62.00   Total pack years: 15.50   Types: Cigarettes   Quit date: 09/05/2018   Years since quitting: 3.8  Smokeless Tobacco Never    Goals Met:  Independence with exercise equipment Exercise tolerated well No report of concerns or symptoms today Strength training completed today  Goals Unmet:  Not Applicable  Comments: Pt able to follow exercise prescription today without complaint.  Will continue to monitor for progression.    Dr. Emily Filbert is Medical Director for [facility].  Dr. Ottie Glazier is Medical Director for [facility] Pulmonary Rehabilitation.

## 2022-06-27 ENCOUNTER — Telehealth (HOSPITAL_COMMUNITY): Payer: Self-pay | Admitting: *Deleted

## 2022-06-27 NOTE — Telephone Encounter (Signed)
Patient called to cancel tikosyn admission - pt states since starting pulmonary rehab his symptoms have started to improve and he would like to hold off on tikosyn at the moment. Pt will call If he decides to proceed at a later time. Informed pt I would let Dr Caryl Comes know regarding decision to hold off on Tikosyn as having symptom improvement with pulmonary rehab.

## 2022-06-28 ENCOUNTER — Encounter: Payer: Medicare Other | Admitting: *Deleted

## 2022-06-28 DIAGNOSIS — J439 Emphysema, unspecified: Secondary | ICD-10-CM

## 2022-06-28 NOTE — Progress Notes (Signed)
Daily Session Note  Patient Details  Name: Levi [name] MRN: [id number] Date of Birth: 04-10-1941 Referring Provider:   April Manson Pulmonary Rehab from 05/26/2022 in [facility] Cardiac and Pulmonary Rehab  Referring Provider Armando Reichert MD       Encounter Date: 06/28/2022  Check In:  Session Check In - 06/28/22 0947       Check-In   Supervising physician immediately available to respond to emergencies See telemetry face sheet for immediately available ER MD    Location [facility]-Cardiac & Pulmonary Rehab    Staff Present Renita Papa, RN BSN;Jessica Luan Pulling, MA, RCEP, CCRP, Bertram Gala, MS, ACSM CEP, Exercise Physiologist    Virtual Visit No    Medication changes reported     No    Fall or balance concerns reported    No    Warm-up and Cool-down Performed on first and last piece of equipment    Resistance Training Performed Yes    VAD Patient? No    PAD/SET Patient? No      Pain Assessment   Currently in Pain? No/denies                Social History   Tobacco Use  Smoking Status Former   Packs/day: 0.25   Years: 62.00   Total pack years: 15.50   Types: Cigarettes   Quit date: 09/05/2018   Years since quitting: 3.8  Smokeless Tobacco Never    Goals Met:  Independence with exercise equipment Exercise tolerated well No report of concerns or symptoms today Strength training completed today  Goals Unmet:  Not Applicable  Comments: Pt able to follow exercise prescription today without complaint.  Will continue to monitor for progression.    Dr. Emily Filbert is Medical Director for [facility].  Dr. Ottie Glazier is Medical Director for [facility] Pulmonary Rehabilitation.

## 2022-06-30 ENCOUNTER — Encounter: Payer: Medicare Other | Admitting: *Deleted

## 2022-06-30 DIAGNOSIS — C3490 Malignant neoplasm of unspecified part of unspecified bronchus or lung: Secondary | ICD-10-CM

## 2022-06-30 DIAGNOSIS — J439 Emphysema, unspecified: Secondary | ICD-10-CM | POA: Diagnosis not present

## 2022-06-30 NOTE — Progress Notes (Signed)
Daily Session Note  Patient Details  Name: Levi [name] MRN: [id number] Date of Birth: [birth year] Referring Provider:   April Manson Pulmonary Rehab from 05/26/2022 in [facility] Re[facility]  Referring Provider Armando Reichert MD       Encounter Date: 06/30/2022  Check In:  Session Check In - 06/30/22 1013       Check-In   Supervising physician immediately available to respond to emergencies See telemetry face sheet for immediately available ER MD    Location [facility]Cardiac & Pulmonary Rehab    Staff Present Larna Daughters, MS, ACSM CEP, Exercise Physiologist;Jessica Luan Pulling, MA, RCEP, CCRP, CCET;Laureen Owens Shark, BS, RRT, CPFT;Megan Tamala Julian, RN, [place];Other   Darel Hong RN BSN   Virtual Visit No    Medication changes reported     No    Fall or balance concerns reported    No    Warm-up and Cool-down Performed on first and last piece of equipment    Resistance Training Performed Yes    VAD Patient? No    PAD/SET Patient? No      Pain Assessment   Currently in Pain? No/denies                Social History   Tobacco Use  Smoking Status Former   Packs/day: 0.25   Years: 62.00   Total pack years: 15.50   Types: Cigarettes   Quit date: 09/05/2018   Years since quitting: 3.8  Smokeless Tobacco Never    Goals Met:  Independence with exercise equipment Exercise tolerated well No report of concerns or symptoms today Strength training completed today  Goals Unmet:  Not Applicable  Comments: Pt able to follow exercise prescription today without complaint.  Will continue to monitor for progression.    Dr. Emily Filbert is Medical Director for [place].  Dr. Ottie Glazier is Medical Director for [facility] Pulmonary Rehabilitation.

## 2022-07-05 ENCOUNTER — Encounter: Payer: Medicare Other | Attending: Student in an Organized Health Care Education/Training Program | Admitting: *Deleted

## 2022-07-05 DIAGNOSIS — J439 Emphysema, unspecified: Secondary | ICD-10-CM | POA: Insufficient documentation

## 2022-07-05 DIAGNOSIS — C3490 Malignant neoplasm of unspecified part of unspecified bronchus or lung: Secondary | ICD-10-CM | POA: Insufficient documentation

## 2022-07-05 NOTE — Progress Notes (Signed)
Daily Session Note  Patient Details  Name: HUSAYN CAPPELLO MRN: [id number] Date of Birth: 27-May-1940 Referring Provider:   April Manson Pulmonary Rehab from 05/26/2022 in [facility] Cardiac and Pulmonary Rehab  Referring Provider Armando Reichert MD       Encounter Date: 07/05/2022  Check In:  Session Check In - 07/05/22 1039       Check-In   Supervising physician immediately available to respond to emergencies See telemetry face sheet for immediately available ER MD    Location [facility]    Staff Present Heath Lark, RN, BSN, CCRP;Jessica Kersey, MA, RCEP, CCRP, Bertram Gala, MS, ACSM CEP, Exercise Physiologist    Virtual Visit No    Medication changes reported     No    Fall or balance concerns reported    No    Warm-up and Cool-down Performed on first and last piece of equipment    Resistance Training Performed Yes    VAD Patient? No    PAD/SET Patient? No      Pain Assessment   Currently in Pain? No/denies                Social History   Tobacco Use  Smoking Status Former   Packs/day: 0.25   Years: 62.00   Total pack years: 15.50   Types: Cigarettes   Quit date: 09/05/2018   Years since quitting: 3.8  Smokeless Tobacco Never    Goals Met:  Proper associated with RPD/PD & O2 Sat Independence with exercise equipment Exercise tolerated well No report of concerns or symptoms today  Goals Unmet:  Not Applicable  Comments: Pt able to follow exercise prescription today without complaint.  Will continue to monitor for progression.    Dr. Emily Filbert is Medical Director for [place].  Dr. Ottie Glazier is Medical Director for [facility] Pulmonary Rehabilitation.

## 2022-07-07 ENCOUNTER — Encounter: Payer: Medicare Other | Admitting: *Deleted

## 2022-07-07 DIAGNOSIS — J439 Emphysema, unspecified: Secondary | ICD-10-CM | POA: Diagnosis not present

## 2022-07-07 NOTE — Progress Notes (Signed)
Daily Session Note  Patient Details  Name: Levi [name] MRN: [id number] Date of Birth: [birth year] Referring Provider:   April Manson Pulmonary Rehab from 05/26/2022 in [facility] Cardiac and Pulmonary Rehab  Referring Provider Armando Reichert MD       Encounter Date: 07/07/2022  Check In:  Session Check In - 07/07/22 1046       Check-In   Supervising physician immediately available to respond to emergencies See telemetry face sheet for immediately available ER MD    Location [facility]    Staff Present Darlyne Russian, RN, ADN;Jessica Luan Pulling, MA, RCEP, CCRP, Bertram Gala, MS, ACSM CEP, Exercise Physiologist    Virtual Visit No    Medication changes reported     No    Fall or balance concerns reported    No    Warm-up and Cool-down Performed on first and last piece of equipment    Resistance Training Performed Yes    VAD Patient? No    PAD/SET Patient? No      Pain Assessment   Currently in Pain? No/denies                Social History   Tobacco Use  Smoking Status Former   Packs/day: 0.25   Years: 62.00   Total pack years: 15.50   Types: Cigarettes   Quit date: 09/05/2018   Years since quitting: 3.8  Smokeless Tobacco Never    Goals Met:  Independence with exercise equipment Exercise tolerated well No report of concerns or symptoms today Strength training completed today  Goals Unmet:  Not Applicable  Comments: Pt able to follow exercise prescription today without complaint.  Will continue to monitor for progression.    Dr. Emily Filbert is Medical Director for [place].  Dr. Ottie Glazier is Medical Director for [facility] Pulmonary Rehabilitation.

## 2022-07-11 ENCOUNTER — Ambulatory Visit (HOSPITAL_COMMUNITY): Payer: Medicare Other | Admitting: Physician Assistant

## 2022-07-12 ENCOUNTER — Encounter: Payer: Medicare Other | Admitting: *Deleted

## 2022-07-12 DIAGNOSIS — J439 Emphysema, unspecified: Secondary | ICD-10-CM | POA: Diagnosis not present

## 2022-07-12 NOTE — Progress Notes (Signed)
Daily Session Note  Patient Details  Name: Levi [name] MRN: [id number] Date of Birth: 25-Oct-1940 Referring Provider:   April Manson Pulmonary Rehab from 05/26/2022 in [facility] Pulmonary Rehab  Referring Provider Armando Reichert MD       Encounter Date: 07/12/2022  Check In:  Session Check In - 07/12/22 1044       Check-In   Supervising physician immediately available to respond to emergencies See telemetry face sheet for immediately available ER MD    Location [facility]    Staff Present Heath Lark, RN, BSN, Kathaleen Maser, MS, ACSM CEP, Exercise Physiologist;Jessica Luan Pulling, MA, RCEP, CCRP, CCET    Virtual Visit No    Medication changes reported     No    Fall or balance concerns reported    No    Warm-up and Cool-down Performed on first and last piece of equipment    Resistance Training Performed Yes    VAD Patient? No    PAD/SET Patient? No      Pain Assessment   Currently in Pain? No/denies               Exercise Prescription Changes - 07/12/22 0900       Home Exercise Plan   Plans to continue exercise at [facility] (comment)   [employer] and walking   Frequency Add 3 additional days to program exercise sessions.    Initial Home Exercises Provided 07/12/22             Social History   Tobacco Use  Smoking Status Former   Packs/day: 0.25   Years: 62.00   Total pack years: 15.50   Types: Cigarettes   Quit date: 09/05/2018   Years since quitting: 3.8  Smokeless Tobacco Never    Goals Met:  Proper associated with RPD/PD & O2 Sat Independence with exercise equipment Exercise tolerated well No report of concerns or symptoms today  Goals Unmet:  Not Applicable  Comments: Pt able to follow exercise prescription today without complaint.  Will continue to monitor for progression.    Dr. Emily Filbert is Medical Director for [place].  Dr. Ottie Glazier is Medical Director for  [facility] Pulmonary Rehabilitation.

## 2022-07-14 ENCOUNTER — Encounter: Payer: Medicare Other | Admitting: *Deleted

## 2022-07-14 DIAGNOSIS — J439 Emphysema, unspecified: Secondary | ICD-10-CM | POA: Diagnosis not present

## 2022-07-14 NOTE — Progress Notes (Signed)
Daily Session Note  Patient Details  Name: TRAMAYNE BOURGEOIS MRN: [id number] Date of Birth: 02/23/1941 Referring Provider:   April Manson Pulmonary Rehab from 05/26/2022 in [facility] Cardiac and Pulmonary Rehab  Referring Provider Armando Reichert MD       Encounter Date: 07/14/2022  Check In:  Session Check In - 07/14/22 1004       Check-In   Supervising physician immediately available to respond to emergencies See telemetry face sheet for immediately available ER MD    Location [facility] Pul[facility]    Staff Present Darlyne Russian, RN, ADN;Jessica Luan Pulling, MA, RCEP, CCRP, Bertram Gala, MS, ACSM CEP, Exercise Physiologist;Joseph Tessie Fass, [place]    Virtual Visit No    Medication changes reported     No    Fall or balance concerns reported    No    Warm-up and Cool-down Performed on first and last piece of equipment    Resistance Training Performed Yes    VAD Patient? No    PAD/SET Patient? No      Pain Assessment   Currently in Pain? No/denies                Social History   Tobacco Use  Smoking Status Former   Packs/day: 0.25   Years: 62.00   Additional pack years: 0.00   Total pack years: 15.50   Types: Cigarettes   Quit date: 09/05/2018   Years since quitting: 3.8  Smokeless Tobacco Never    Goals Met:  Independence with exercise equipment Exercise tolerated well No report of concerns or symptoms today Strength training completed today  Goals Unmet:  Not Applicable  Comments: Pt able to follow exercise prescription today without complaint.  Will continue to monitor for progression.    Dr. Emily Filbert is Medical Director for [place].  Dr. Ottie Glazier is Medical Director for [facility] Pulmonary Rehabilitation.

## 2022-07-19 ENCOUNTER — Encounter: Payer: Medicare Other | Admitting: *Deleted

## 2022-07-19 DIAGNOSIS — J439 Emphysema, unspecified: Secondary | ICD-10-CM

## 2022-07-19 NOTE — Progress Notes (Signed)
Daily Session Note  Patient Details  Name: EARSEL BRADBURY MRN: [id number] Date of Birth: 27-Nov-1940 Referring Provider:   April Manson Pulmonary Rehab from 05/26/2022 in [facility] Cardiac and Pulmonary Rehab  Referring Provider Armando Reichert MD       Encounter Date: 07/19/2022  Check In:  Session Check In - 07/19/22 1102       Check-In   Supervising physician immediately available to respond to emergencies See telemetry face sheet for immediately available ER MD    Location [facility] Pul[facility] Re[facility]    Staff Present Heath Lark, RN, BSN, CCRP;Jessica Hamilton, MA, RCEP, CCRP, Bertram Gala, MS, ACSM CEP, Exercise Physiologist    Virtual Visit No    Medication changes reported     No    Fall or balance concerns reported    No    Warm-up and Cool-down Performed on first and last piece of equipment    Resistance Training Performed Yes    VAD Patient? No    PAD/SET Patient? No      Pain Assessment   Currently in Pain? No/denies                Social History   Tobacco Use  Smoking Status Former   Packs/day: 0.25   Years: 62.00   Additional pack years: 0.00   Total pack years: 15.50   Types: Cigarettes   Quit date: 09/05/2018   Years since quitting: 3.8  Smokeless Tobacco Never    Goals Met:  Proper associated with RPD/PD & O2 Sat Independence with exercise equipment Exercise tolerated well No report of concerns or symptoms today  Goals Unmet:  Not Applicable  Comments: Pt able to follow exercise prescription today without complaint.  Will continue to monitor for progression.    Dr. Emily Filbert is Medical Director for [place].  Dr. Ottie Glazier is Medical Director for [facility] Pulmonary Rehabilitation.

## 2022-07-20 ENCOUNTER — Encounter: Payer: Self-pay | Admitting: *Deleted

## 2022-07-20 DIAGNOSIS — J439 Emphysema, unspecified: Secondary | ICD-10-CM

## 2022-07-20 NOTE — Progress Notes (Signed)
Pulmonary Individual Treatment Plan  Patient Details  Name: Levi [name] MRN: [id number] Date of Birth: 1941/04/30 Referring Provider:   April Manson Pulmonary Rehab from 05/26/2022 in [facility] Cardiac and Pulmonary Rehab  Referring Provider Armando Reichert MD       Initial Encounter Date:  Flowsheet Row Pulmonary Rehab from 05/26/2022 in [facility] Cardiac and Pulmonary Rehab  Date 05/26/22       Visit Diagnosis: Pulmonary emphysema, unspecified emphysema type ([place])  Patient's Home Medications on Admission:  Current Outpatient Medications:    albuterol (PROVENTIL HFA;VENTOLIN HFA) 108 (90 Base) MCG/ACT inhaler, Inhale 2 puffs into the lungs every 6 (six) hours as needed for wheezing or shortness of breath., Disp: 1 Inhaler, Rfl: 0   apixaban (ELIQUIS) 5 MG TABS tablet, Take 5 mg by mouth 2 (two) times daily., Disp: , Rfl:    carboxymethylcellulose (REFRESH PLUS) 0.5 % SOLN, 1 drop 4 (four) times daily as needed., Disp: , Rfl:    finasteride (PROSCAR) 5 MG tablet, Take 5 mg by mouth daily., Disp: , Rfl:    fluticasone-salmeterol (ADVAIR) 250-50 MCG/ACT AEPB, Inhale 1 puff into the lungs in the morning and at bedtime., Disp: , Rfl:    levothyroxine (SYNTHROID) 200 MCG tablet, Take 200 mcg by mouth daily before breakfast., Disp: , Rfl:    SEMAGLUTIDE,0.25 OR 0.5MG /DOS, [place], Inject 0.5 mg into the skin once a week., Disp: , Rfl:    tamsulosin (FLOMAX) 0.4 MG CAPS capsule, 0.8 mg daily., Disp: , Rfl:    Tiotropium Bromide Monohydrate (SPIRIVA RESPIMAT) 2.5 MCG/ACT AERS, Inhale 2 puffs into the lungs daily., Disp: 1 each, Rfl: 11   torsemide (DEMADEX) 20 MG tablet, Take 1 tablet (20 mg total) by mouth every other day., Disp: 45 tablet, Rfl: 1   vitamin B-12 (CYANOCOBALAMIN) 1000 MCG tablet, Take 1,000 mcg by mouth daily., Disp: , Rfl:   Past Medical History: Past Medical History:  Diagnosis Date   Asthma    COPD (chronic obstructive pulmonary disease) ([place])    Hearing loss    Hypertension     Hypothyroidism    Mass of left lung    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[place])    Prostate enlargement    Shortness of breath    Squamous cell carcinoma of lung, left ([facility])    Thyroid disease    Tobacco abuse     Tobacco Use: Social History   Tobacco Use  Smoking Status Former   Packs/day: 0.25   Years: 62.00   Additional pack years: 0.00   Total pack years: 15.50   Types: Cigarettes   Quit date: 09/05/2018   Years since quitting: 3.8  Smokeless Tobacco Never    Labs: Review Flowsheet       Latest Ref Rng & Units 10/15/2018 03/09/2021  Labs for ITP Cardiac and Pulmonary Rehab  Hemoglobin A1c 4.8 - 5.6 % - 7.8   PH, Arterial 7.350 - 7.450 7.42  -  PCO2 arterial 32.0 - 48.0 mmHg 35  -  Bicarbonate 20.0 - 28.0 mmol/L 22.7  -  Acid-base deficit 0.0 - 2.0 mmol/L 1.3  -  O2 Saturation % 93.1  -     Pulmonary Assessment Scores:  Pulmonary Assessment Scores     Row Name 05/26/22 1539         ADL UCSD   ADL Phase Entry     SOB Score total 77     Rest 0     Walk 3     Stairs  4     Bath 3     Dress 3     Shop 3       CAT Score   CAT Score 21       mMRC Score   mMRC Score 2              UCSD: Self-administered rating of dyspnea associated with activities of daily living (ADLs) 6-point scale (0 = "not at all" to 5 = "maximal or unable to do because of breathlessness")  Scoring Scores range from 0 to 120.  Minimally important difference is 5 units  CAT: CAT can identify the health impairment of COPD patients and is better correlated with disease progression.  CAT has a scoring range of zero to 40. The CAT score is classified into four groups of low (less than 10), medium (10 - 20), high (21-30) and very high (31-40) based on the impact level of disease on health status. A CAT score over 10 suggests significant symptoms.  A worsening CAT score could be explained by an exacerbation, poor medication adherence, poor inhaler technique, or progression of COPD or comorbid  conditions.  CAT MCID is 2 points  mMRC: mMRC (Modified Medical Research Council) Dyspnea Scale is used to assess the degree of baseline functional disability in patients of respiratory disease due to dyspnea. No minimal important difference is established. A decrease in score of 1 point or greater is considered a positive change.   Pulmonary Function Assessment:   Exercise Target Goals: Exercise Program Goal: Individual exercise prescription set using results from initial 6 min walk test and THRR while considering  patient's activity barriers and safety.   Exercise Prescription Goal: Initial exercise prescription builds to 30-45 minutes a day of aerobic activity, 2-3 days per week.  Home exercise guidelines will be given to patient during program as part of exercise prescription that the participant will acknowledge.  Education: Aerobic Exercise: - Group verbal and visual presentation on the components of exercise prescription. Introduces F.I.T.T principle from ACSM for exercise prescriptions.  Reviews F.I.T.T. principles of aerobic exercise including progression. Written material given at graduation.   Education: Resistance Exercise: - Group verbal and visual presentation on the components of exercise prescription. Introduces F.I.T.T principle from ACSM for exercise prescriptions  Reviews F.I.T.T. principles of resistance exercise including progression. Written material given at graduation.    Education: Exercise & Equipment Safety: - Individual verbal instruction and demonstration of equipment use and safety with use of the equipment. Flowsheet Row Pulmonary Rehab from 06/16/2022 in [facility] Cardiac and Pulmonary Rehab  Education need identified 05/26/22  Date 05/26/22  Educator KW  Instruction Review Code 1- Verbalizes Understanding       Education: Exercise Physiology & General Exercise Guidelines: - Group verbal and written instruction with models to review the exercise  physiology of the cardiovascular system and associated critical values. Provides general exercise guidelines with specific guidelines to those with heart or lung disease.    Education: Flexibility, Balance, Mind/Body Relaxation: - Group verbal and visual presentation with interactive activity on the components of exercise prescription. Introduces F.I.T.T principle from ACSM for exercise prescriptions. Reviews F.I.T.T. principles of flexibility and balance exercise training including progression. Also discusses the mind body connection.  Reviews various relaxation techniques to help reduce and manage stress (i.e. Deep breathing, progressive muscle relaxation, and visualization). Balance handout provided to take home. Written material given at graduation. Flowsheet Row Pulmonary Rehab from 10/08/2020 in [facility] Cardiac and Pulmonary Rehab  Date 09/03/20  Educator AS  Instruction Review Code 1- Verbalizes Understanding       Activity Barriers & Risk Stratification:  Activity Barriers & Cardiac Risk Stratification - 05/26/22 1552       Activity Barriers & Cardiac Risk Stratification   Activity Barriers Deconditioning;Shortness of Breath;Muscular Weakness;Back Problems    Comments Sciatica             6 Minute Walk:  6 Minute Walk     Row Name 05/26/22 1554         6 Minute Walk   Phase Initial     Distance 890 feet     Walk Time 6 minutes     # of Rest Breaks 0     MPH 1.68     METS 1.15     RPE 13     Perceived Dyspnea  3     VO2 Peak 4.03     Symptoms Yes (comment)     Comments Back pain caused from sciatica 9/10, SOB     Resting HR 66 bpm     Resting BP 140/76     Resting Oxygen Saturation  93 %     Exercise Oxygen Saturation  during 6 min walk 86 %     Max Ex. HR 94 bpm     Max Ex. BP 148/74     2 Minute Post BP 136/76       Interval HR   1 Minute HR 81     2 Minute HR 91     3 Minute HR 94     4 Minute HR 94     5 Minute HR 91     6 Minute HR 90     2 Minute  Post HR 93     Interval Heart Rate? Yes       Interval Oxygen   Interval Oxygen? Yes     Baseline Oxygen Saturation % 93 %     1 Minute Oxygen Saturation % 91 %     1 Minute Liters of Oxygen 0 L  RA     2 Minute Oxygen Saturation % 88 %     2 Minute Liters of Oxygen 0 L     3 Minute Oxygen Saturation % 87 %     3 Minute Liters of Oxygen 0 L     4 Minute Oxygen Saturation % 88 %     4 Minute Liters of Oxygen 0 L     5 Minute Oxygen Saturation % 87 %     5 Minute Liters of Oxygen 0 L     6 Minute Oxygen Saturation % 86 %     6 Minute Liters of Oxygen 0 L     2 Minute Post Oxygen Saturation % 93 %     2 Minute Post Liters of Oxygen 0 L             Oxygen Initial Assessment:  Oxygen Initial Assessment - 05/26/22 1538       Home Oxygen   Home Oxygen Device None   prn only- does not use currently   Sleep Oxygen Prescription None    Home Exercise Oxygen Prescription None    Home Resting Oxygen Prescription None    Compliance with Home Oxygen Use Yes   prn     Initial 6 min Walk   Oxygen Used None      Program Oxygen Prescription   Program Oxygen Prescription None   prn  Intervention   Short Term Goals To learn and understand importance of monitoring SPO2 with pulse oximeter and demonstrate accurate use of the pulse oximeter.;To learn and understand importance of maintaining oxygen saturations>88%;To learn and demonstrate proper pursed lip breathing techniques or other breathing techniques. ;To learn and demonstrate proper use of respiratory medications;To learn and exhibit compliance with exercise, home and travel O2 prescription    Long  Term Goals Maintenance of O2 saturations>88%;Exhibits proper breathing techniques, such as pursed lip breathing or other method taught during program session;Exhibits compliance with exercise, home  and travel O2 prescription;Compliance with respiratory medication;Demonstrates proper use of MDI's;Verbalizes importance of monitoring SPO2 with  pulse oximeter and return demonstration             Oxygen Re-Evaluation:  Oxygen Re-Evaluation     Row Name 06/16/22 1024 07/12/22 1004           Program Oxygen Prescription   Program Oxygen Prescription None None        Home Oxygen   Home Oxygen Device None None      Sleep Oxygen Prescription None None      Home Exercise Oxygen Prescription None None      Home Resting Oxygen Prescription None None      Compliance with Home Oxygen Use Yes Yes  uses as needed at home        Goals/Expected Outcomes   Short Term Goals To learn and understand importance of monitoring SPO2 with pulse oximeter and demonstrate accurate use of the pulse oximeter.;To learn and understand importance of maintaining oxygen saturations>88%;To learn and demonstrate proper pursed lip breathing techniques or other breathing techniques. ;To learn and demonstrate proper use of respiratory medications;To learn and exhibit compliance with exercise, home and travel O2 prescription To learn and understand importance of monitoring SPO2 with pulse oximeter and demonstrate accurate use of the pulse oximeter.;To learn and understand importance of maintaining oxygen saturations>88%;To learn and demonstrate proper pursed lip breathing techniques or other breathing techniques. ;To learn and demonstrate proper use of respiratory medications;To learn and exhibit compliance with exercise, home and travel O2 prescription      Long  Term Goals Maintenance of O2 saturations>88%;Exhibits proper breathing techniques, such as pursed lip breathing or other method taught during program session;Exhibits compliance with exercise, home  and travel O2 prescription;Compliance with respiratory medication;Demonstrates proper use of MDI's;Verbalizes importance of monitoring SPO2 with pulse oximeter and return demonstration Maintenance of O2 saturations>88%;Exhibits proper breathing techniques, such as pursed lip breathing or other method taught during  program session;Exhibits compliance with exercise, home  and travel O2 prescription;Compliance with respiratory medication;Demonstrates proper use of MDI's;Verbalizes importance of monitoring SPO2 with pulse oximeter and return demonstration      Comments Levi [name] went to the [place] yesterday where they did a stress test to assess his breathing. [facility] will [facility]. We also reviewed PLB with the patient. Priscilla is good about watching his saturations and using his PLB to help bring it back up.  His breathing is still his biggest limiting factor as it wears him out quickly.  He tries to work on breathing daily to help and good about using his inhalers too.      Goals/Expected Outcomes Short: Receive results to stress test. Long: Become proficient with PLB. Short: Continue to work on PLB Long: continue to work on breathing regularly               Oxygen Discharge (Final  Oxygen Re-Evaluation):  Oxygen Re-Evaluation - 07/12/22 1004       Program Oxygen Prescription   Program Oxygen Prescription None      Home Oxygen   Home Oxygen Device None    Sleep Oxygen Prescription None    Home Exercise Oxygen Prescription None    Home Resting Oxygen Prescription None    Compliance with Home Oxygen Use Yes   uses as needed at home     Goals/Expected Outcomes   Short Term Goals To learn and understand importance of monitoring SPO2 with pulse oximeter and demonstrate accurate use of the pulse oximeter.;To learn and understand importance of maintaining oxygen saturations>88%;To learn and demonstrate proper pursed lip breathing techniques or other breathing techniques. ;To learn and demonstrate proper use of respiratory medications;To learn and exhibit compliance with exercise, home and travel O2 prescription    Long  Term Goals Maintenance of O2 saturations>88%;Exhibits proper breathing techniques, such as pursed lip breathing or other method taught during program  session;Exhibits compliance with exercise, home  and travel O2 prescription;Compliance with respiratory medication;Demonstrates proper use of MDI's;Verbalizes importance of monitoring SPO2 with pulse oximeter and return demonstration    Comments Levi [name] is good about watching his saturations and using his PLB to help bring it back up.  His breathing is still his biggest limiting factor as it wears him out quickly.  He tries to work on breathing daily to help and good about using his inhalers too.    Goals/Expected Outcomes Short: Continue to work on PLB Long: continue to work on breathing regularly             Initial Exercise Prescription:  Initial Exercise Prescription - 05/26/22 1600       Date of Initial Exercise RX and Referring Provider   Date 05/26/22    Referring Provider Armando Reichert MD      Oxygen   Maintain Oxygen Saturation 88% or higher      Recumbant Bike   Level 1    RPM 60    Watts 15    Minutes 15    METs 1.1      T5 Nustep   Level 1    SPM 80    Minutes 15    METs 1.1      Biostep-RELP   Level 1    SPM 50    Minutes 15    METs 1.1      Track   Laps 17    Minutes 15    METs 1.92      Prescription Details   Frequency (times per week) 2    Duration Progress to 30 minutes of continuous aerobic without signs/symptoms of physical distress      Intensity   THRR 40-80% of Max Heartrate 96 - 126    Ratings of Perceived Exertion 11-13    Perceived Dyspnea 0-4      Progression   Progression Continue to progress workloads to maintain intensity without signs/symptoms of physical distress.      Resistance Training   Training Prescription Yes    Weight 4 lb    Reps 10-15             Perform Capillary Blood Glucose checks as needed.  Exercise Prescription Changes:   Exercise Prescription Changes     Row Name 05/26/22 1600 06/14/22 1400 06/27/22 1200 07/11/22 1500 07/12/22 0900     Response to Exercise   Blood Pressure (Admit) 140/76  134/68 132/74 130/68 --  Blood Pressure (Exercise) 148/74 152/72 126/64 132/70 --   Blood Pressure (Exit) 134/72 130/64 100/64 108/62 --   Heart Rate (Admit) 66 bpm 68 bpm 74 bpm 74 bpm --   Heart Rate (Exercise) 94 bpm 96 bpm 97 bpm 101 bpm --   Heart Rate (Exit) 73 bpm 90 bpm 86 bpm 84 bpm --   Oxygen Saturation (Admit) 93 % 93 % 94 % 92 % --   Oxygen Saturation (Exercise) 86 % 88 % 88 % 88 % --   Oxygen Saturation (Exit) 93 % 91 % 93 % 94 % --   Rating of Perceived Exertion (Exercise) 13 15 13 13  --   Perceived Dyspnea (Exercise) 3 3 2 2  --   Symptoms Back pain 9/10, SOB none SOB, back pain SOB --   Comments walk test results First two full days of exercise -- -- --   Duration -- Progress to 30 minutes of  aerobic without signs/symptoms of physical distress Continue with 30 min of aerobic exercise without signs/symptoms of physical distress. Continue with 30 min of aerobic exercise without signs/symptoms of physical distress. --   Intensity -- THRR unchanged THRR unchanged THRR unchanged --     Progression   Progression -- Continue to progress workloads to maintain intensity without signs/symptoms of physical distress. Continue to progress workloads to maintain intensity without signs/symptoms of physical distress. Continue to progress workloads to maintain intensity without signs/symptoms of physical distress. --   Average METs -- 1.94 2.04 2.11 --     Resistance Training   Training Prescription -- Yes Yes Yes --   Weight -- 4 lb 4 lb 6 lb --   Reps -- 10-15 10-15 10-15 --     Interval Training   Interval Training -- No No No --     Oxygen   Oxygen -- Continuous Continuous -- --     Recumbant Bike   Level -- 1 4 2  --   Watts -- 15 20 19  --   Minutes -- 15 15 15  --   METs -- -- 1.87 2.49 --     NuStep   Level -- -- -- 3 --   Minutes -- -- -- 15 --   METs -- -- -- 2 --     T5 Nustep   Level -- 2 3 2  --   Minutes -- 15 15 15  --   METs -- 1.8 2 1.9 --     Biostep-RELP    Level -- 2 2 -- --   Minutes -- 15 15 -- --   METs -- 2 2 -- --     Track   Laps -- 19 22 19  --   Minutes -- 15 15 15  --   METs -- 2.03 2.2 2.03 --     Home Exercise Plan   Plans to continue exercise at -- -- -- -- [employer] (comment)  [employer] and walking   Frequency -- -- -- -- Add 3 additional days to program exercise sessions.   Initial Home Exercises Provided -- -- -- -- 07/12/22     Oxygen   Maintain Oxygen Saturation -- 88% or higher 88% or higher 88% or higher --            Exercise Comments:   Exercise Goals and Review:   Exercise Goals     Row Name 05/26/22 1618             Exercise Goals   Increase Physical Activity Yes  Intervention Provide advice, education, support and counseling about physical activity/exercise needs.;Develop an individualized exercise prescription for aerobic and resistive training based on initial evaluation findings, risk stratification, comorbidities and participant's personal goals.       Expected Outcomes Short Term: Attend rehab on a regular basis to increase amount of physical activity.;Long Term: Add in home exercise to make exercise part of routine and to increase amount of physical activity.;Long Term: Exercising regularly at least 3-5 days a week.       Increase Strength and Stamina Yes       Intervention Provide advice, education, support and counseling about physical activity/exercise needs.;Develop an individualized exercise prescription for aerobic and resistive training based on initial evaluation findings, risk stratification, comorbidities and participant's personal goals.       Expected Outcomes Short Term: Increase workloads from initial exercise prescription for resistance, speed, and METs.;Short Term: Perform resistance training exercises routinely during rehab and add in resistance training at home;Long Term: Improve cardiorespiratory fitness, muscular endurance and strength as measured by increased METs  and functional capacity (6MWT)       Able to understand and use rate of perceived exertion (RPE) scale Yes       Intervention Provide education and explanation on how to use RPE scale       Expected Outcomes Short Term: Able to use RPE daily in rehab to express subjective intensity level;Long Term:  Able to use RPE to guide intensity level when exercising independently       Able to understand and use Dyspnea scale Yes       Intervention Provide education and explanation on how to use Dyspnea scale       Expected Outcomes Short Term: Able to use Dyspnea scale daily in rehab to express subjective sense of shortness of breath during exertion;Long Term: Able to use Dyspnea scale to guide intensity level when exercising independently       Knowledge and understanding of Target Heart Rate Range (THRR) Yes       Intervention Provide education and explanation of THRR including how the numbers were predicted and where they are located for reference       Expected Outcomes Short Term: Able to state/look up THRR;Short Term: Able to use daily as guideline for intensity in rehab;Long Term: Able to use THRR to govern intensity when exercising independently       Able to check pulse independently Yes       Intervention Provide education and demonstration on how to check pulse in carotid and radial arteries.;Review the importance of being able to check your own pulse for safety during independent exercise       Expected Outcomes Short Term: Able to explain why pulse checking is important during independent exercise;Long Term: Able to check pulse independently and accurately       Understanding of Exercise Prescription Yes       Intervention Provide education, explanation, and written materials on patient's individual exercise prescription       Expected Outcomes Short Term: Able to explain program exercise prescription;Long Term: Able to explain home exercise prescription to exercise independently                 Exercise Goals Re-Evaluation :  Exercise Goals Re-Evaluation     Row Name 06/14/22 1505 06/16/22 1002 06/27/22 1213 07/11/22 1523 07/12/22 0952     Exercise Goal Re-Evaluation   Exercise Goals Review Increase Physical Activity;Increase Strength and Stamina;Understanding of Exercise Prescription Increase Physical  Activity;Increase Strength and Stamina;Understanding of Exercise Prescription Increase Physical Activity;Increase Strength and Stamina;Understanding of Exercise Prescription Increase Physical Activity;Increase Strength and Stamina;Understanding of Exercise Prescription Increase Physical Activity;Increase Strength and Stamina;Understanding of Exercise Prescription   Comments Levi [name] is off to a good start in the program. He had an average MET level of 1.94 METs during his first two sessions in the program. He also was able to work at level 2 on the biostep and T4 Nustep. He walked up to 19 laps on the track as well. We will continue to monitor his progress in the program. Levi [name] states that he feels he is doing well in the program. He states that he is able to move a lot better since starting the program. This is the patients second time in the program and he states that he knows the program is beneficial, so he looks forward to the long term benefits of staying consistent with his exercise in the program. Patient states that he has been doing some resistance training with hand weights at home on his days away from rehab. He was encouraged to begin walking some on his days away from rehab. He stated that he and his wife plan to join the [facility] to begin exercising together. We will continue to monitor his progress in the program. Levi [name] continues to do well in rehab. He increased to level 3 on the T5 Nustep and up to level 4 on the recumbent bike. He is limited with his walking due to chronic back pain, however, he was still able to walk 22 laps on the track, which is his highest yet.  Oxygen  saturations are staying 88% or higher. We will continue to monitor. Levi [name] is doing well in rehab. He has continued to work at level 2 on the T5 nustep and recumbent bike, and level 3 on the T4 nustep. He also has continued to walk between 19-22 laps on the track as well. He also increased to 6 lb hand weights for resistance training. We will continue to monitor his progress in the program. Levi [name] is doing well in rehab.  He is walking some on his off days when it is warmer out.  They are planning to join [employer] with [employer].  He said his breathing does better inside than outside especially in the spring.  He is getting more stamina, but wants to lose weight. Reviewed home exercise with pt today.  Pt plans to walk and join [facility] for exercise.  Reviewed THR, pulse, RPE, sign and symptoms, pulse oximetery and when to call 911 or MD.  Also discussed weather considerations and indoor options.  Pt voiced understanding.   Expected Outcomes Short: Continue to follow current exercise prescription. Long: Continue to improve strength and stamina. Short: [facility] [employer] [facility] on days away from rehab. Long: Continue to improve strength and stamina. Short: Continue to increase laps on track as tolerated with back Long: Continue to increase overall MET level and stamina Short: Continue to increase laps on track. Long: Continue to improve strength and stamina. Short: Join [employer]  Long: Exercise on off days consistently            Discharge Exercise Prescription (Final Exercise Prescription Changes):  Exercise Prescription Changes - 07/12/22 0900       Home Exercise Plan   Plans to continue exercise at [employer] (comment)   [employer] and walking   Frequency Add 3 additional days to program exercise sessions.    Initial  Home Exercises Provided 07/12/22             Nutrition:  Target Goals: Understanding of nutrition guidelines, daily intake of sodium 1500mg , cholesterol 200mg ,  calories 30% from fat and 7% or less from saturated fats, daily to have 5 or more servings of fruits and vegetables.  Education: All About Nutrition: -Group instruction provided by verbal, written material, interactive activities, discussions, models, and posters to present general guidelines for heart healthy nutrition including fat, fiber, MyPlate, the role of sodium in heart healthy nutrition, utilization of the nutrition label, and utilization of this knowledge for meal planning. Follow up email sent as well. Written material given at graduation. Flowsheet Row Pulmonary Rehab from 10/08/2020 in [facility] Cardiac and Pulmonary Rehab  Date 09/10/20  Educator [facility]  Instruction Review Code 1- Verbalizes Understanding       Biometrics:  Pre Biometrics - 05/26/22 1553       Pre Biometrics   Height 5\' 11"  (1.803 m)    Weight 270 lb 8 oz (122.7 kg)    Waist Circumference 55 inches    Hip Circumference 48.5 inches    Waist to Hip Ratio 1.13 %    BMI (Calculated) 37.74    Single Leg Stand 3.2 seconds              Nutrition Therapy Plan and Nutrition Goals:  Nutrition Therapy & Goals - 05/26/22 1547       Nutrition Therapy   RD appointment deferred Yes   Deferred     Intervention Plan   Intervention Prescribe, educate and counsel regarding individualized specific dietary modifications aiming towards targeted core components such as weight, hypertension, lipid management, diabetes, heart failure and other comorbidities.    Expected Outcomes Short Term Goal: Understand basic principles of dietary content, such as calories, fat, sodium, cholesterol and nutrients.;Short Term Goal: A plan has been developed with personal nutrition goals set during dietitian appointment.;Long Term Goal: Adherence to prescribed nutrition plan.             Nutrition Assessments:  MEDIFICTS Score Key: ?70 Need to make dietary changes  40-70 Heart Healthy Diet ? 40 Therapeutic Level Cholesterol  Diet  Flowsheet Row Pulmonary Rehab from 05/26/2022 in [facility] Cardiac and Pulmonary Rehab  Picture Your Plate Total Score on Admission 59      Picture Your Plate Scores: [id number] Unhealthy dietary pattern with much room for improvement. 41-50 Dietary pattern unlikely to meet recommendations for good health and room for improvement. 51-60 More healthful dietary pattern, with some room for improvement.  >60 Healthy dietary pattern, although there may be some specific behaviors that could be improved.   Nutrition Goals Re-Evaluation:  Nutrition Goals Re-Evaluation     Levi [name] Name 06/16/22 1014 07/12/22 0958           Goals   Nutrition Goal -- Continue to work on healthy eating.      Comment Levi [name] denies wanting to meet with the RD at this time. However, he states that his wife has helped him work on healthy eating. They attended the nutrition education classes the last time he was in the program and have implemented the things that they learned. Levi [name] continues to [employer].  His wife makes sure that he eats good but he admits to over eating on occasion and is trying to cut back.  His wife usually cooks at home and tries to get a good variety.  He needs to work on portion control.  Expected Outcome Continue to work on healthy eating. Short: Work on portion control Long: Continue to eat heart healthy               Nutrition Goals Discharge (Final Nutrition Goals Re-Evaluation):  Nutrition Goals Re-Evaluation - 07/12/22 0958       Goals   Nutrition Goal Continue to work on healthy eating.    Comment Levi [name] continues to [employer].  His wife makes sure that he eats good but he admits to over eating on occasion and is trying to cut back.  His wife usually cooks at home and tries to get a good variety.  He needs to work on portion control.    Expected Outcome Short: Work on portion control Long: Continue to eat heart healthy              Psychosocial: Target Goals: Acknowledge presence or absence of significant depression and/or stress, maximize coping skills, provide positive support system. Participant is able to verbalize types and ability to use techniques and skills needed for reducing stress and depression.   Education: Stress, Anxiety, and Depression - Group verbal and visual presentation to define topics covered.  Reviews how body is impacted by stress, anxiety, and depression.  Also discusses healthy ways to reduce stress and to treat/manage anxiety and depression.  Written material given at graduation. Flowsheet Row Pulmonary Rehab from 10/08/2020 in [facility] Cardiac and Pulmonary Rehab  Date 10/08/20  Educator [facility]  Instruction Review Code 1- [employer] Understanding       Education: Sleep Hygiene -Provides group verbal and written instruction about how sleep can affect your health.  Define sleep hygiene, discuss sleep cycles and impact of sleep habits. Review good sleep hygiene tips.    Initial Review & Psychosocial Screening:  Initial Psych Review & Screening - 05/17/22 1410       Initial Review   Current issues with Current Stress Concerns    Source of Stress Concerns Unable to participate in former interests or hobbies;Unable to perform yard/household activities;Chronic Illness      Family Dynamics   Good Support System? Yes   wife     Barriers   Psychosocial barriers to participate in program There are no identifiable barriers or psychosocial needs.;The patient should benefit from training in stress management and relaxation.      Screening Interventions   Interventions Encouraged to exercise;Provide feedback about the scores to participant;To provide support and resources with identified psychosocial needs    Expected Outcomes Short Term goal: Utilizing psychosocial counselor, staff and physician to assist with identification of specific Stressors or current issues interfering with healing process.  Setting desired goal for each stressor or current issue identified.;Long Term Goal: Stressors or current issues are controlled or eliminated.;Short Term goal: Identification and review with participant of any Quality of Life or Depression concerns found by scoring the questionnaire.;Long Term goal: The participant improves quality of Life and PHQ9 Scores as seen by post scores and/or verbalization of changes             Quality of Life Scores:  Scores of 19 and below usually indicate a poorer quality of life in these areas.  A difference of  2-3 points is a clinically meaningful difference.  A difference of 2-3 points in the total score of the Quality of Life Index has been associated with significant improvement in overall quality of life, self-image, physical symptoms, and general health in studies assessing change in quality of life.  PHQ-9: Review Flowsheet  More data exists      05/26/2022 01/26/2021 08/31/2020 10/20/2015 10/16/2015  Depression screen PHQ 2/9  Decreased Interest 1 1 1  0 0  Down, Depressed, Hopeless 0 0 0 0 0  PHQ - 2 Score 1 1 1  0 0  Altered sleeping 0 1 0 - -  Tired, decreased energy 3 2 1  - -  Change in appetite 0 2 0 - -  Feeling bad or failure about yourself  0 0 0 - -  Trouble concentrating 0 0 0 - -  Moving slowly or fidgety/restless 0 0 0 - -  Suicidal thoughts 0 0 0 - -  PHQ-9 Score 4 6 2  - -  Difficult doing work/chores Not difficult at all Somewhat difficult Not difficult at all - -   Interpretation of Total Score  Total Score Depression Severity:  1-4 = Minimal depression, 5-9 = Mild depression, 10-14 = Moderate depression, 15-19 = Moderately severe depression, 20-27 = Severe depression   Psychosocial Evaluation and Intervention:  Psychosocial Evaluation - 05/17/22 1416       Psychosocial Evaluation & Interventions   Interventions Encouraged to exercise with the program and follow exercise prescription    Comments Levi [name] is 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 He has done the program before and is familiar with what to expect. 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 His wife is his main support system and is very involved in his health care. She states that his doctor wants him to lose weight and thinks that will help with his breathing and afib. She is concerned about his stamina. He mentioned she probably knows more about his health than he does. He does want to increase his stamina and feel stronger while improving his breathing. His health journey has been rough these last few years and he wants to focus on feeling better.    Expected Outcomes Short: attend pulmonary rehab for education and exercise. Long: Develop and maintain positive self care habits.    Continue Psychosocial Services  Follow up required by staff             Psychosocial Re-Evaluation:  Psychosocial Re-Evaluation     Levi [name] Name 06/16/22 1010 07/12/22 0956           Psychosocial Re-Evaluation   Current issues with Current Stress Concerns Current Stress Concerns      Comments Levi [name] is doing well mentally. He denies any depression or anxiety at this time. He does state that he experiences some stress due to his health concerns. He does not like being unable to do the things he wants to do. He reports sleeping well at this time. He reports having a good support system at home made up by his whole family. He does state that coming to rehab has helped his mental state because he knows he is working towards getting better. Levi [name] i s doing well in rehab.  He denies any major stressors or symptoms of depression. His health is his biggest stress.  He has also gotten frustrated over his hearing aids not working right and trying to get back into [place] to be seen.  He continues to have a good support system at home. He sleeps well and says sometimes he sleeps too much when he naps in his chair.      Expected Outcomes Short:  Continue to exercise for mental boost. Long: Maintain positive outlook. Short: Exercise more for  mental boost Long: Continue to stay positive      Interventions Encouraged to attend Pulmonary Rehabilitation for the exercise Encouraged to attend Pulmonary Rehabilitation for the exercise      Continue Psychosocial Services  Follow up required by staff Follow up required by staff        Initial Review   Source of Stress Concerns Unable to participate in former interests or hobbies;Unable to perform yard/household activities;Chronic Illness --               Psychosocial Discharge (Final Psychosocial Re-Evaluation):  Psychosocial Re-Evaluation - 07/12/22 0956       Psychosocial Re-Evaluation   Current issues with Current Stress Concerns    Comments Levi [name] i s doing well in rehab.  He denies any major stressors or symptoms of depression. His health is his biggest stress.  He has also gotten frustrated over his hearing aids not working right and trying to get back into [place] to be seen.  He continues to have a good support system at home. He sleeps well and says sometimes he sleeps too much when he naps in his chair.    Expected Outcomes Short: Exercise more for mental boost Long: Continue to stay positive    Interventions Encouraged to attend Pulmonary Rehabilitation for the exercise    Continue Psychosocial Services  Follow up required by staff             Education: Education Goals: Education classes will be provided on a weekly basis, covering required topics. Participant will state understanding/return demonstration of topics presented.  Learning Barriers/Preferences:  Learning Barriers/Preferences - 05/17/22 1408       Learning Barriers/Preferences   Learning Barriers Hearing    Learning Preferences None             General Pulmonary Education Topics:  Infection Prevention: - Provides verbal and written material to individual with discussion of infection control  including proper hand washing and proper equipment cleaning during exercise session. Flowsheet Row Pulmonary Rehab from 06/16/2022 in [facility] Cardiac and Pulmonary Rehab  Education need identified 05/26/22  Date 05/26/22  Educator KW  Instruction Review Code 1- Verbalizes Understanding       Falls Prevention: - Provides verbal and written material to individual with discussion of falls prevention and safety. Flowsheet Row Pulmonary Rehab from 06/16/2022 in [facility] Cardiac and Pulmonary Rehab  Education need identified 05/26/22  Date 05/26/22  Educator KW  Instruction Review Code 1- Verbalizes Understanding       Chronic Lung Disease Review: - Group verbal instruction with posters, models, PowerPoint presentations and videos,  to review new updates, new respiratory medications, new advancements in procedures and treatments. Providing information on websites and "800" numbers for continued self-education. Includes information about supplement oxygen, available portable oxygen systems, continuous and intermittent flow rates, oxygen safety, concentrators, and Medicare reimbursement for oxygen. Explanation of Pulmonary Drugs, including class, frequency, complications, importance of spacers, rinsing mouth after steroid MDI's, and proper cleaning methods for nebulizers. Review of basic lung anatomy and physiology related to function, structure, and complications of lung disease. Review of risk factors. Discussion about methods for diagnosing sleep apnea and types of masks and machines for OSA. Includes a review of the use of types of environmental controls: home humidity, furnaces, filters, dust mite/pet prevention, HEPA vacuums. Discussion about weather changes, air quality and the benefits of nasal washing. Instruction on Warning signs, infection symptoms, calling MD promptly, preventive modes, and value of vaccinations. Review of effective airway  clearance, coughing and/or vibration techniques. Emphasizing  that all should Create an Action Plan. Written material given at graduation. Flowsheet Row Pulmonary Rehab from 06/16/2022 in [facility] Cardiac and Pulmonary Rehab  Education need identified 05/26/22       AED/CPR: - Group verbal and written instruction with the use of models to demonstrate the basic use of the AED with the basic ABC's of resuscitation.    Anatomy and Cardiac Procedures: - Group verbal and visual presentation and models provide information about basic cardiac anatomy and function. Reviews the testing methods done to diagnose heart disease and the outcomes of the test results. Describes the treatment choices: Medical Management, Angioplasty, or Coronary Bypass Surgery for treating various heart conditions including Myocardial Infarction, Angina, Valve Disease, and Cardiac Arrhythmias.  Written material given at graduation.   Medication Safety: - Group verbal and visual instruction to review commonly prescribed medications for heart and lung disease. Reviews the medication, class of the drug, and side effects. Includes the steps to properly store meds and maintain the prescription regimen.  Written material given at graduation. Flowsheet Row Pulmonary Rehab from 10/08/2020 in [facility] Cardiac and Pulmonary Rehab  Date 09/17/20  Educator SB  Instruction Review Code 1- Verbalizes Understanding       Other: -Provides group and verbal instruction on various topics (see comments)   Knowledge Questionnaire Score:  Knowledge Questionnaire Score - 05/26/22 1536       Knowledge Questionnaire Score   Pre Score 17/18              Core Components/Risk Factors/Patient Goals at Admission:  Personal Goals and Risk Factors at Admission - 05/26/22 1618       Core Components/Risk Factors/Patient Goals on Admission    Weight Management Yes;Weight Loss;Obesity    Intervention Weight Management: Develop a combined nutrition and exercise program designed to reach desired caloric intake,  while maintaining appropriate intake of nutrient and fiber, sodium and fats, and appropriate energy expenditure required for the weight goal.;Weight Management: Provide education and appropriate resources to help participant work on and attain dietary goals.;Weight Management/Obesity: Establish reasonable short term and long term weight goals.;Obesity: Provide education and appropriate resources to help participant work on and attain dietary goals.    Admit Weight 270 lb (122.5 kg)    Goal Weight: Short Term 265 lb (120.2 kg)    Goal Weight: Long Term 230 lb (104.3 kg)    Expected Outcomes Short Term: Continue to assess and modify interventions until short term weight is achieved;Long Term: Adherence to nutrition and physical activity/exercise program aimed toward attainment of established weight goal;Weight Loss: Understanding of general recommendations for a balanced deficit meal plan, which promotes 1-2 lb weight loss per week and includes a negative energy balance of [phone] kcal/d;Understanding recommendations for meals to include 15-35% energy as protein, 25-35% energy from fat, 35-60% energy from carbohydrates, less than 200mg  of dietary cholesterol, 20-35 gm of total fiber daily;Understanding of distribution of calorie intake throughout the day with the consumption of 4-5 meals/snacks    Improve shortness of breath with ADL's Yes    Intervention Provide education, individualized exercise plan and daily activity instruction to help decrease symptoms of SOB with activities of daily living.    Expected Outcomes Short Term: Improve cardiorespiratory fitness to achieve a reduction of symptoms when performing ADLs;Long Term: Be able to perform more ADLs without symptoms or delay the onset of symptoms    Diabetes Yes    Intervention Provide education about signs/symptoms and  action to take for hypo/hyperglycemia.;Provide education about proper nutrition, including hydration, and aerobic/resistive exercise  prescription along with prescribed medications to achieve blood glucose in normal ranges: Fasting glucose 65-99 mg/dL    Expected Outcomes Short Term: Participant verbalizes understanding of the signs/symptoms and immediate care of hyper/hypoglycemia, proper foot care and importance of medication, aerobic/resistive exercise and nutrition plan for blood glucose control.;Long Term: Attainment of HbA1C < 7%.    Hypertension Yes    Intervention Provide education on lifestyle modifcations including regular physical activity/exercise, weight management, moderate sodium restriction and increased consumption of fresh fruit, vegetables, and low fat dairy, alcohol moderation, and smoking cessation.;Monitor prescription use compliance.    Expected Outcomes Short Term: Continued assessment and intervention until BP is < 140/27mm HG in hypertensive participants. < 130/8mm HG in hypertensive participants with diabetes, heart failure or chronic kidney disease.;Long Term: Maintenance of blood pressure at goal levels.             Education:Diabetes - Individual verbal and written instruction to review signs/symptoms of diabetes, desired ranges of glucose level fasting, after meals and with exercise. Acknowledge that pre and post exercise glucose checks will be done for 3 sessions at entry of program. Flowsheet Row Pulmonary Rehab from 05/17/2022 in [facility] Cardiac and Pulmonary Rehab  Date 05/17/22  Educator [facility]  Instruction Review Code 1- Verbalizes Understanding       Know Your Numbers and Heart Failure: - Group verbal and visual instruction to discuss disease risk factors for cardiac and pulmonary disease and treatment options.  Reviews associated critical values for Overweight/Obesity, Hypertension, Cholesterol, and Diabetes.  Discusses basics of heart failure: signs/symptoms and treatments.  Introduces Heart Failure Zone chart for action plan for heart failure.  Written material given at  graduation. Flowsheet Row Pulmonary Rehab from 10/08/2020 in [facility] Cardiac and Pulmonary Rehab  Date 09/24/20  Educator SB  Instruction Review Code 1- Verbalizes Understanding       Core Components/Risk Factors/Patient Goals Review:   Goals and Risk Factor Review     Row Name 06/16/22 1016 07/12/22 1001           Core Components/Risk Factors/Patient Goals Review   Personal Goals Review Weight Management/Obesity;Diabetes;Hypertension Weight Management/Obesity;Diabetes;Hypertension;Improve shortness of breath with ADL's;Increase knowledge of respiratory medications and ability to use respiratory devices properly.      Review Levi [name] states that he would like to lose a little weight. He is currently at 267.1 lbs but would like to get down to around 230 lbs. Bandon has been checking his blood sugars at home around once a week and states that they have been within normal ranges. He also reports checking his BP at home and states that it has been within normal ranges as well. Lochlann's weight has started to creep back up again.  He wants to be more active to help with weight loss but he gets SOB quickly from his afib.  They wants him to try a new medication but it would require him to be in hospital to start and if he misses a dose he would be back in hospital.  He does not want that kind of stress and is trying to learn to live with it.  He is doing well with his inhaler and medications.  He does have oxygen to use at home as needed, but he has not needed it very often.  His pressures have been good and blood sugars have been good too.      Expected Outcomes Short: Continue  to work towards weight goal. Long: Continue to monitor lifestyle risk factors. Short: Conitnue to work on weight loss by moving more Long: Continue to monitor risk factors               Core Components/Risk Factors/Patient Goals at Discharge (Final Review):   Goals and Risk Factor Review - 07/12/22 1001       Core  Components/Risk Factors/Patient Goals Review   Personal Goals Review Weight Management/Obesity;Diabetes;Hypertension;Improve shortness of breath with ADL's;Increase knowledge of respiratory medications and ability to use respiratory devices properly.    Review Levi [name]'s weight has started to creep back up again.  He wants to be more active to help with weight loss but he gets SOB quickly from his afib.  They wants him to try a new medication but it would require him to be in hospital to start and if he misses a dose he would be back in hospital.  He does not want that kind of stress and is trying to learn to live with it.  He is doing well with his inhaler and medications.  He does have oxygen to use at home as needed, but he has not needed it very often.  His pressures have been good and blood sugars have been good too.    Expected Outcomes Short: Conitnue to work on weight loss by moving more Long: Continue to monitor risk factors             ITP Comments:  ITP Comments     Row Name 05/17/22 1417 05/26/22 1611 06/22/22 1253 07/20/22 0821     ITP Comments Initial phone call completed. Diagnosis can be found in [facility] 1/11. EP Orientation scheduled for Thursday 1/18 at 2pm. Completed 6MWT and gym orientation. Initial ITP created and sent for review to Dr. Ottie Glazier, , Medical Director. 30 day review completed. ITP sent to Dr. Zetta Bills, Medical Director of  Pulmonary Rehab. Continue with ITP unless changes are made by physician. 30 Day review completed. Medical Director ITP review done, changes made as directed, and signed approval by Medical Director.             Comments:

## 2022-07-21 ENCOUNTER — Encounter: Payer: Medicare Other | Admitting: *Deleted

## 2022-07-21 DIAGNOSIS — C3490 Malignant neoplasm of unspecified part of unspecified bronchus or lung: Secondary | ICD-10-CM

## 2022-07-21 DIAGNOSIS — J439 Emphysema, unspecified: Secondary | ICD-10-CM | POA: Diagnosis not present

## 2022-07-21 NOTE — Progress Notes (Signed)
Daily Session Note  Patient Details  Name: SANSKAR MANDELL MRN: [id number] Date of Birth: 01-Jul-1940 Referring Provider:   April Manson Pulmonary Rehab from 05/26/2022 in [facility] Cardiac and Pul[facility] Rehab  Referring Provider Armando Reichert MD       Encounter Date: 07/21/2022  Check In:  Session Check In - 07/21/22 1037       Check-In   Supervising physician immediately available to respond to emergencies See telemetry face sheet for immediately available ER MD    Location [facility]-Cardiac & Pulmonary Rehab    Staff Present Larna Daughters, MS, ACSM CEP, Exercise Physiologist;Joseph Rosebud Poles, RN, Diamond Nickel RN, BSN    Virtual Visit No    Medication changes reported     No    Fall or balance concerns reported    No    Tobacco Cessation No Change    Warm-up and Cool-down Performed on first and last piece of equipment    Resistance Training Performed Yes    VAD Patient? No    PAD/SET Patient? No      Pain Assessment   Currently in Pain? No/denies                Social History   Tobacco Use  Smoking Status Former   Packs/day: 0.25   Years: 62.00   Additional pack years: 0.00   Total pack years: 15.50   Types: Cigarettes   Quit date: 09/05/2018   Years since quitting: 3.8  Smokeless Tobacco Never    Goals Met:  Independence with exercise equipment Exercise tolerated well No report of concerns or symptoms today Strength training completed today  Goals Unmet:  Not Applicable  Comments: Pt able to follow exercise prescription today without complaint.  Will continue to monitor for progression.    Dr. Emily Filbert is Medical Director for [facility].  Dr. Ottie Glazier is Medical Director for [facility] Pulmonary Rehabilitation.

## 2022-07-26 ENCOUNTER — Encounter: Payer: Medicare Other | Admitting: *Deleted

## 2022-07-26 DIAGNOSIS — J439 Emphysema, unspecified: Secondary | ICD-10-CM | POA: Diagnosis not present

## 2022-07-26 NOTE — Progress Notes (Signed)
Daily Session Note  Patient Details  Name: Levi [name] MRN: [id number] Date of Birth: 1940-10-23 Referring Provider:   April Manson Pulmonary Rehab from 05/26/2022 in [facility] Cardiac and Pulmonary Rehab  Referring Provider Armando Reichert MD       Encounter Date: 07/26/2022  Check In:  Session Check In - 07/26/22 1038       Check-In   Supervising physician immediately available to respond to emergencies See telemetry face sheet for immediately available ER MD    Location [facility] Pul[facility] Re[facility]    Staff Present Heath Lark, RN, BSN, CCRP;Jessica [place], MA, RCEP, CCRP, Bertram Gala, MS, ACSM CEP, Exercise Physiologist    Virtual Visit No    Medication changes reported     No    Fall or balance concerns reported    No    Warm-up and Cool-down Performed on first and last piece of equipment    Resistance Training Performed Yes    VAD Patient? No    PAD/SET Patient? No      Pain Assessment   Currently in Pain? No/denies                Social History   Tobacco Use  Smoking Status Former   Packs/day: 0.25   Years: 62.00   Additional pack years: 0.00   Total pack years: 15.50   Types: Cigarettes   Quit date: 09/05/2018   Years since quitting: 3.8  Smokeless Tobacco Never    Goals Met:  Proper associated with RPD/PD & O2 Sat Independence with exercise equipment Exercise tolerated well No report of concerns or symptoms today  Goals Unmet:  Not Applicable  Comments: Pt able to follow exercise prescription today without complaint.  Will continue to monitor for progression.    Dr. Emily Filbert is Medical Director for [place].  Dr. Ottie Glazier is Medical Director for [facility] Pulmonary Rehabilitation.

## 2022-07-28 ENCOUNTER — Encounter: Payer: Medicare Other | Admitting: *Deleted

## 2022-07-28 DIAGNOSIS — J439 Emphysema, unspecified: Secondary | ICD-10-CM | POA: Diagnosis not present

## 2022-07-28 NOTE — Progress Notes (Signed)
Daily Session Note  Patient Details  Name: Levi [name] MRN: [id number] Date of Birth: 10-12-1940 Referring Provider:   April Manson Pulmonary Rehab from 05/26/2022 in [facility] Cardiac and Pulmonary Rehab  Referring Provider Armando Reichert MD       Encounter Date: 07/28/2022  Check In:  Session Check In - 07/28/22 1046       Check-In   Supervising physician immediately available to respond to emergencies See telemetry face sheet for immediately available ER MD    Location [facility] Pul[facility] Re[facility]    Staff Present Darlyne Russian, RN, Lorin Mercy, MS, ACSM CEP, Exercise Physiologist;Joseph Tessie Fass, [place]    Virtual Visit No    Medication changes reported     No    Fall or balance concerns reported    No    Warm-up and Cool-down Performed on first and last piece of equipment    Resistance Training Performed Yes    VAD Patient? No    PAD/SET Patient? No      Pain Assessment   Currently in Pain? No/denies                Social History   Tobacco Use  Smoking Status Former   Packs/day: 0.25   Years: 62.00   Additional pack years: 0.00   Total pack years: 15.50   Types: Cigarettes   Quit date: 09/05/2018   Years since quitting: 3.8  Smokeless Tobacco Never    Goals Met:  Independence with exercise equipment Exercise tolerated well No report of concerns or symptoms today Strength training completed today  Goals Unmet:  Not Applicable  Comments: Pt able to follow exercise prescription today without complaint.  Will continue to monitor for progression.    Dr. Emily Filbert is Medical Director for [place].  Dr. Ottie Glazier is Medical Director for [facility] Pulmonary Rehabilitation.

## 2022-08-02 ENCOUNTER — Encounter: Payer: Medicare Other | Attending: Student in an Organized Health Care Education/Training Program | Admitting: *Deleted

## 2022-08-02 DIAGNOSIS — C3490 Malignant neoplasm of unspecified part of unspecified bronchus or lung: Secondary | ICD-10-CM | POA: Diagnosis present

## 2022-08-02 DIAGNOSIS — J439 Emphysema, unspecified: Secondary | ICD-10-CM | POA: Insufficient documentation

## 2022-08-02 NOTE — Progress Notes (Signed)
Daily Session Note  Patient Details  Name: Levi [name] MRN: [id number] Date of Birth: 06/12/1940 Referring Provider:   April Manson Pulmonary Rehab from 05/26/2022 in [facility] Cardiac and Pulmonary Rehab  Referring Provider Armando Reichert MD       Encounter Date: 08/02/2022  Check In:  Session Check In - 08/02/22 1003       Check-In   Supervising physician immediately available to respond to emergencies See telemetry face sheet for immediately available ER MD    Location [facility]Cardiac & Pulmonary Rehab    Staff Present Renita Papa, RN BSN;Melissa [place] MS, RDN, Carles Collet, MS, ACSM CEP, Exercise Physiologist;Other   Gretchen Short BA, Exercise Science   Virtual Visit No    Medication changes reported     No    Fall or balance concerns reported    No    Warm-up and Cool-down Performed on first and last piece of equipment    Resistance Training Performed Yes    VAD Patient? No    PAD/SET Patient? No      Pain Assessment   Currently in Pain? No/denies                Social History   Tobacco Use  Smoking Status Former   Packs/day: 0.25   Years: 62.00   Additional pack years: 0.00   Total pack years: 15.50   Types: Cigarettes   Quit date: 09/05/2018   Years since quitting: 3.9  Smokeless Tobacco Never    Goals Met:  Independence with exercise equipment Exercise tolerated well No report of concerns or symptoms today Strength training completed today  Goals Unmet:  Not Applicable  Comments: Pt able to follow exercise prescription today without complaint.  Will continue to monitor for progression.    Dr. Emily Filbert is Medical Director for [place].  Dr. Ottie Glazier is Medical Director for [facility] Pulmonary Rehabilitation.

## 2022-08-04 ENCOUNTER — Encounter: Payer: Medicare Other | Admitting: *Deleted

## 2022-08-09 ENCOUNTER — Encounter: Payer: Medicare Other | Admitting: *Deleted

## 2022-08-09 DIAGNOSIS — J439 Emphysema, unspecified: Secondary | ICD-10-CM

## 2022-08-09 DIAGNOSIS — C3490 Malignant neoplasm of unspecified part of unspecified bronchus or lung: Secondary | ICD-10-CM

## 2022-08-09 NOTE — Progress Notes (Signed)
Daily Session Note  Patient Details  Name: Levi [name] MRN: [id number] Date of Birth: [name] 17, 1941 Referring Provider:   Doristine Devoid Pulmonary Rehab from 05/26/2022 in [facility] Cardiac and Pulmonary Rehab  Referring Provider [name] MD       Encounter Date: 08/09/2022  Check In:  Session Check In - 08/09/22 1131       Check-In   Supervising physician immediately available to respond to emergencies See telemetry face sheet for immediately available ER MD    Location [facility]Cardiac & Pulmonary Rehab    Staff Present Cyndia Diver, RN, BSN, Clyde Canterbury MS, RDN, LDN;Jessica [place], MA, RCEP, CCRP, Zackery Barefoot, MS, ACSM CEP, Exercise Physiologist    Virtual Visit No    Medication changes reported     No    Fall or balance concerns reported    No    Tobacco Cessation No Change    Warm-up and Cool-down Performed on first and last piece of equipment    Resistance Training Performed Yes    VAD Patient? No    PAD/SET Patient? No      Pain Assessment   Currently in Pain? No/denies                Social History   Tobacco Use  Smoking Status Former   Packs/day: 0.25   Years: 62.00   Additional pack years: 0.00   Total pack years: 15.50   Types: Cigarettes   Quit date: 09/05/2018   Years since quitting: 3.9  Smokeless Tobacco Never    Goals Met:  Independence with exercise equipment Exercise tolerated well No report of concerns or symptoms today  Goals Unmet:  Not Applicable  Comments: Pt able to follow exercise prescription today without complaint.  Will continue to monitor for progression.    Dr. Bethann Punches is Medical Director for [facility] Cardiac Rehabilitation.  Dr. Vida Rigger is Medical Director for [facility] Pulmonary Rehabilitation.

## 2022-08-11 ENCOUNTER — Encounter: Payer: Self-pay | Admitting: Radiation Oncology

## 2022-08-11 ENCOUNTER — Ambulatory Visit
Admission: RE | Admit: 2022-08-11 | Discharge: 2022-08-11 | Disposition: A | Discharge status: Home / Self Care | Payer: Medicare Other | Source: Ambulatory Visit | Attending: Radiation Oncology | Admitting: Radiation Oncology

## 2022-08-11 ENCOUNTER — Ambulatory Visit: Payer: No Typology Code available for payment source | Admitting: Internal Medicine

## 2022-08-11 VITALS — BP 134/69 | HR 52 | Temp 97.8°F | Resp 20 | Ht 71.0 in | Wt 271.4 lb

## 2022-08-11 DIAGNOSIS — Z923 Personal history of irradiation: Secondary | ICD-10-CM | POA: Diagnosis not present

## 2022-08-11 DIAGNOSIS — I4891 Unspecified atrial fibrillation: Secondary | ICD-10-CM | POA: Diagnosis not present

## 2022-08-11 DIAGNOSIS — Z85118 Personal history of other malignant neoplasm of bronchus and lung: Secondary | ICD-10-CM | POA: Insufficient documentation

## 2022-08-11 DIAGNOSIS — J432 Centrilobular emphysema: Secondary | ICD-10-CM | POA: Insufficient documentation

## 2022-08-11 DIAGNOSIS — Z9221 Personal history of antineoplastic chemotherapy: Secondary | ICD-10-CM | POA: Diagnosis not present

## 2022-08-11 DIAGNOSIS — C3412 Malignant neoplasm of upper lobe, left bronchus or lung: Secondary | ICD-10-CM

## 2022-08-11 NOTE — Progress Notes (Signed)
Radiation Oncology Follow up Note  Name: Levi [name]   Date:   08/11/2022 MRN:  [id number] DOB: January 25, 1941    This 82 y.o. 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  REFERRING PROVIDER: [facility]  HPI: Patient is a 82 year old male now out over 3-1/2 years having completed concurrent chemoradiation for stage IIa non-small cell lung cancer left upper lobe.  He continues to have dyspnea on exertion and labored breathing.  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  He specifically [place] cough hemoptysis or chest tightness..  He had a CT scan back in October showing posttreatment changes in the left chest without evidence of recurrent or metastatic disease does have moderate to marked pulmonary emphysema.  COMPLICATIONS OF TREATMENT: none  FOLLOW UP COMPLIANCE: keeps appointments   PHYSICAL EXAM:  BP 134/69   Pulse (!) 52   Temp 97.8 F (36.6 C)   Resp 20   Ht 5\' 11"  (1.803 m)   Wt 271 lb 6.4 oz (123.1 kg)   BMI 37.85 kg/m  Well-developed well-nourished patient in NAD. HEENT reveals PERLA, EOMI, discs not visualized.  Oral cavity is clear. No oral mucosal lesions are identified. Neck is clear without evidence of cervical or supraclavicular adenopathy. Lungs are clear to A&P. Cardiac examination is essentially unremarkable with regular rate and rhythm without murmur rub or thrill. Abdomen is benign with no organomegaly or masses noted. Motor sensory and DTR levels are equal and symmetric in the upper and lower extremities. Cranial nerves II through XII are grossly intact. Proprioception is intact. No peripheral adenopathy or edema is identified. No motor or sensory levels are noted. Crude visual fields are within normal range.  RADIOLOGY RESULTS: CT scans reviewed compatible with above-stated findings  PLAN: Present  time I am going to discontinue follow-up care.  He continues close follow-up care with medical oncology.  He is also under treatment now for A-fib.  Patient is to call with any concerns at any time.  I would like to take this opportunity to thank you for allowing me to participate in the care of your patient.Carmina Miller, MD

## 2022-08-16 ENCOUNTER — Encounter: Payer: Medicare Other | Admitting: *Deleted

## 2022-08-16 DIAGNOSIS — J439 Emphysema, unspecified: Secondary | ICD-10-CM

## 2022-08-16 DIAGNOSIS — C3490 Malignant neoplasm of unspecified part of unspecified bronchus or lung: Secondary | ICD-10-CM

## 2022-08-16 NOTE — Progress Notes (Signed)
Daily Session Note  Patient Details  Name: Levi [name] MRN: [id number] Date of Birth: 31-May-1940 Referring Provider:   Doristine Devoid Pulmonary Rehab from 05/26/2022 in [facility] Cardiac and Pulmonary Rehab  Referring Provider [name] MD       Encounter Date: 08/16/2022  Check In:  Session Check In - 08/16/22 1116       Check-In   Supervising physician immediately available to respond to emergencies See telemetry face sheet for immediately available ER MD    Location [facility]-Cardiac & Pulmonary Rehab    Staff Present Cyndia Diver, RN, BSN, Clyde Canterbury MS, RDN, LDN;Jessica [place], MA, RCEP, CCRP, Zackery Barefoot, MS, ACSM CEP, Exercise Physiologist    Virtual Visit No    Medication changes reported     No    Fall or balance concerns reported    No    Tobacco Cessation No Change    Warm-up and Cool-down Performed on first and last piece of equipment    Resistance Training Performed Yes    VAD Patient? No    PAD/SET Patient? No      Pain Assessment   Currently in Pain? No/denies                Social History   Tobacco Use  Smoking Status Former   Packs/day: 0.25   Years: 62.00   Additional pack years: 0.00   Total pack years: 15.50   Types: Cigarettes   Quit date: 09/05/2018   Years since quitting: 3.9  Smokeless Tobacco Never    Goals Met:  Independence with exercise equipment Exercise tolerated well No report of concerns or symptoms today  Goals Unmet:  Not Applicable  Comments: Pt able to follow exercise prescription today without complaint.  Will continue to monitor for progression.    Dr. Bethann Punches is Medical Director for [facility] Cardiac Rehabilitation.  Dr. Vida Rigger is Medical Director for [facility] Pulmonary Rehabilitation.

## 2022-08-17 ENCOUNTER — Encounter: Payer: Self-pay | Admitting: *Deleted

## 2022-08-17 DIAGNOSIS — J439 Emphysema, unspecified: Secondary | ICD-10-CM

## 2022-08-17 NOTE — Progress Notes (Signed)
Pulmonary Individual Treatment Plan  Patient Details  Name: Levi [name] MRN: [id number] Date of Birth: 1940-09-27 Referring Provider:   Doristine Devoid Pulmonary Rehab from 05/26/2022 in [facility] Cardiac and Pulmonary Rehab  Referring Provider [name] MD       Initial Encounter Date:  Flowsheet Row Pulmonary Rehab from 05/26/2022 in [facility] Cardiac and Pulmonary Rehab  Date 05/26/22       Visit Diagnosis: Pulmonary emphysema, unspecified emphysema type  Patient's Home Medications on Admission:  Current Outpatient Medications:    albuterol (PROVENTIL HFA;VENTOLIN HFA) 108 (90 Base) MCG/ACT inhaler, Inhale 2 puffs into the lungs every 6 (six) hours as needed for wheezing or shortness of breath., Disp: 1 Inhaler, Rfl: 0   apixaban (ELIQUIS) 5 MG TABS tablet, Take 5 mg by mouth 2 (two) times daily., Disp: , Rfl:    carboxymethylcellulose (REFRESH PLUS) 0.5 % SOLN, 1 drop 4 (four) times daily as needed., Disp: , Rfl:    finasteride (PROSCAR) 5 MG tablet, Take 5 mg by mouth daily., Disp: , Rfl:    fluticasone-salmeterol (ADVAIR) 250-50 MCG/ACT AEPB, Inhale 1 puff into the lungs in the morning and at bedtime., Disp: , Rfl:    levothyroxine (SYNTHROID) 200 MCG tablet, Take 200 mcg by mouth daily before breakfast., Disp: , Rfl:    SEMAGLUTIDE,0.25 OR 0.5MG /DOS, [place], Inject 0.5 mg into the skin once a week., Disp: , Rfl:    tamsulosin (FLOMAX) 0.4 MG CAPS capsule, 0.8 mg daily., Disp: , Rfl:    Tiotropium Bromide Monohydrate (SPIRIVA RESPIMAT) 2.5 MCG/ACT AERS, Inhale 2 puffs into the lungs daily., Disp: 1 each, Rfl: 11   torsemide (DEMADEX) 20 MG tablet, Take 1 tablet (20 mg total) by mouth every other day., Disp: 45 tablet, Rfl: 1   vitamin B-12 (CYANOCOBALAMIN) 1000 MCG tablet, Take 1,000 mcg by mouth daily., Disp: , Rfl:   Past Medical History: Past Medical History:  Diagnosis Date   Asthma    COPD (chronic obstructive pulmonary disease)    Hearing loss    Hypertension     Hypothyroidism    Mass of left lung    Melanoma in situ of face    Prostate enlargement    Shortness of breath    Squamous cell carcinoma of lung, left    Thyroid disease    Tobacco abuse     Tobacco Use: Social History   Tobacco Use  Smoking Status Former   Packs/day: 0.25   Years: 62.00   Additional pack years: 0.00   Total pack years: 15.50   Types: Cigarettes   Quit date: 09/05/2018   Years since quitting: 3.9  Smokeless Tobacco Never    Labs: Review Flowsheet       Latest Ref Rng & Units 10/15/2018 03/09/2021  Labs for ITP Cardiac and Pulmonary Rehab  Hemoglobin A1c 4.8 - 5.6 % - 7.8   PH, Arterial 7.350 - 7.450 7.42  -  PCO2 arterial 32.0 - 48.0 mmHg 35  -  Bicarbonate 20.0 - 28.0 mmol/L 22.7  -  Acid-base deficit 0.0 - 2.0 mmol/L 1.3  -  O2 Saturation % 93.1  -     Pulmonary Assessment Scores:  Pulmonary Assessment Scores     Row Name 05/26/22 1539         ADL UCSD   ADL Phase Entry     SOB Score total 77     Rest 0     Walk 3     Stairs 4  Bath 3     Dress 3     Shop 3       CAT Score   CAT Score 21       mMRC Score   mMRC Score 2              UCSD: Self-administered rating of dyspnea associated with activities of daily living (ADLs) 6-point scale (0 = "not at all" to 5 = "maximal or unable to do because of breathlessness")  Scoring Scores range from 0 to 120.  Minimally important difference is 5 units  CAT: CAT can identify the health impairment of COPD patients and is better correlated with disease progression.  CAT has a scoring range of zero to 40. The CAT score is classified into four groups of low (less than 10), medium (10 - 20), high (21-30) and very high (31-40) based on the impact level of disease on health status. A CAT score over 10 suggests significant symptoms.  A worsening CAT score could be explained by an exacerbation, poor medication adherence, poor inhaler technique, or progression of COPD or comorbid conditions.   CAT MCID is 2 points  mMRC: mMRC (Modified Medical Research Council) Dyspnea Scale is used to assess the degree of baseline functional disability in patients of respiratory disease due to dyspnea. No minimal important difference is established. A decrease in score of 1 point or greater is considered a positive change.   Pulmonary Function Assessment:   Exercise Target Goals: Exercise Program Goal: Individual exercise prescription set using results from initial 6 min walk test and THRR while considering  patient's activity barriers and safety.   Exercise Prescription Goal: Initial exercise prescription builds to 30-45 minutes a day of aerobic activity, 2-3 days per week.  Home exercise guidelines will be given to patient during program as part of exercise prescription that the participant will acknowledge.  Education: Aerobic Exercise: - Group verbal and visual presentation on the components of exercise prescription. Introduces F.I.T.T principle from ACSM for exercise prescriptions.  Reviews F.I.T.T. principles of aerobic exercise including progression. Written material given at graduation.   Education: Resistance Exercise: - Group verbal and visual presentation on the components of exercise prescription. Introduces F.I.T.T principle from ACSM for exercise prescriptions  Reviews F.I.T.T. principles of resistance exercise including progression. Written material given at graduation.    Education: Exercise & Equipment Safety: - Individual verbal instruction and demonstration of equipment use and safety with use of the equipment. Flowsheet Row Pulmonary Rehab from 06/16/2022 in [facility] Cardiac and Pulmonary Rehab  Education need identified 05/26/22  Date 05/26/22  Educator KW  Instruction Review Code 1- Verbalizes Understanding       Education: Exercise Physiology & General Exercise Guidelines: - Group verbal and written instruction with models to review the exercise physiology of the  cardiovascular system and associated critical values. Provides general exercise guidelines with specific guidelines to those with heart or lung disease.    Education: Flexibility, Balance, Mind/Body Relaxation: - Group verbal and visual presentation with interactive activity on the components of exercise prescription. Introduces F.I.T.T principle from ACSM for exercise prescriptions. Reviews F.I.T.T. principles of flexibility and balance exercise training including progression. Also discusses the mind body connection.  Reviews various relaxation techniques to help reduce and manage stress (i.e. Deep breathing, progressive muscle relaxation, and visualization). Balance handout provided to take home. Written material given at graduation. Flowsheet Row Pulmonary Rehab from 10/08/2020 in [facility] Cardiac and Pulmonary Rehab  Date 09/03/20  Educator AS  Instruction  Review Code 1- Verbalizes Understanding       Activity Barriers & Risk Stratification:  Activity Barriers & Cardiac Risk Stratification - 05/26/22 1552       Activity Barriers & Cardiac Risk Stratification   Activity Barriers Deconditioning;Shortness of Breath;Muscular Weakness;Back Problems    Comments Sciatica             6 Minute Walk:  6 Minute Walk     Row Name 05/26/22 1554         6 Minute Walk   Phase Initial     Distance 890 feet     Walk Time 6 minutes     # of Rest Breaks 0     MPH 1.68     METS 1.15     RPE 13     Perceived Dyspnea  3     VO2 Peak 4.03     Symptoms Yes (comment)     Comments Back pain caused from sciatica 9/10, SOB     Resting HR 66 bpm     Resting BP 140/76     Resting Oxygen Saturation  93 %     Exercise Oxygen Saturation  during 6 min walk 86 %     Max Ex. HR 94 bpm     Max Ex. BP 148/74     2 Minute Post BP 136/76       Interval HR   1 Minute HR 81     2 Minute HR 91     3 Minute HR 94     4 Minute HR 94     5 Minute HR 91     6 Minute HR 90     2 Minute Post HR 93      Interval Heart Rate? Yes       Interval Oxygen   Interval Oxygen? Yes     Baseline Oxygen Saturation % 93 %     1 Minute Oxygen Saturation % 91 %     1 Minute Liters of Oxygen 0 L  RA     2 Minute Oxygen Saturation % 88 %     2 Minute Liters of Oxygen 0 L     3 Minute Oxygen Saturation % 87 %     3 Minute Liters of Oxygen 0 L     4 Minute Oxygen Saturation % 88 %     4 Minute Liters of Oxygen 0 L     5 Minute Oxygen Saturation % 87 %     5 Minute Liters of Oxygen 0 L     6 Minute Oxygen Saturation % 86 %     6 Minute Liters of Oxygen 0 L     2 Minute Post Oxygen Saturation % 93 %     2 Minute Post Liters of Oxygen 0 L             Oxygen Initial Assessment:  Oxygen Initial Assessment - 05/26/22 1538       Home Oxygen   Home Oxygen Device None   prn only- does not use currently   Sleep Oxygen Prescription None    Home Exercise Oxygen Prescription None    Home Resting Oxygen Prescription None    Compliance with Home Oxygen Use Yes   prn     Initial 6 min Walk   Oxygen Used None      Program Oxygen Prescription   Program Oxygen Prescription None   prn  Intervention   Short Term Goals To learn and understand importance of monitoring SPO2 with pulse oximeter and demonstrate accurate use of the pulse oximeter.;To learn and understand importance of maintaining oxygen saturations>88%;To learn and demonstrate proper pursed lip breathing techniques or other breathing techniques. ;To learn and demonstrate proper use of respiratory medications;To learn and exhibit compliance with exercise, home and travel O2 prescription    Long  Term Goals Maintenance of O2 saturations>88%;Exhibits proper breathing techniques, such as pursed lip breathing or other method taught during program session;Exhibits compliance with exercise, home  and travel O2 prescription;Compliance with respiratory medication;Demonstrates proper use of MDI's;Verbalizes importance of monitoring SPO2 with pulse oximeter  and return demonstration             Oxygen Re-Evaluation:  Oxygen Re-Evaluation     Row Name 06/16/22 1024 07/12/22 1004 07/26/22 1031         Program Oxygen Prescription   Program Oxygen Prescription None None None       Home Oxygen   Home Oxygen Device None None None     Sleep Oxygen Prescription None None None     Home Exercise Oxygen Prescription None None None     Home Resting Oxygen Prescription None None None     Compliance with Home Oxygen Use Yes Yes  uses as needed at home --       Goals/Expected Outcomes   Short Term Goals To learn and understand importance of monitoring SPO2 with pulse oximeter and demonstrate accurate use of the pulse oximeter.;To learn and understand importance of maintaining oxygen saturations>88%;To learn and demonstrate proper pursed lip breathing techniques or other breathing techniques. ;To learn and demonstrate proper use of respiratory medications;To learn and exhibit compliance with exercise, home and travel O2 prescription To learn and understand importance of monitoring SPO2 with pulse oximeter and demonstrate accurate use of the pulse oximeter.;To learn and understand importance of maintaining oxygen saturations>88%;To learn and demonstrate proper pursed lip breathing techniques or other breathing techniques. ;To learn and demonstrate proper use of respiratory medications;To learn and exhibit compliance with exercise, home and travel O2 prescription To learn and understand importance of monitoring SPO2 with pulse oximeter and demonstrate accurate use of the pulse oximeter.;To learn and understand importance of maintaining oxygen saturations>88%;To learn and demonstrate proper pursed lip breathing techniques or other breathing techniques. ;To learn and demonstrate proper use of respiratory medications;To learn and exhibit compliance with exercise, home and travel O2 prescription     Long  Term Goals Maintenance of O2 saturations>88%;Exhibits proper  breathing techniques, such as pursed lip breathing or other method taught during program session;Exhibits compliance with exercise, home  and travel O2 prescription;Compliance with respiratory medication;Demonstrates proper use of MDI's;Verbalizes importance of monitoring SPO2 with pulse oximeter and return demonstration Maintenance of O2 saturations>88%;Exhibits proper breathing techniques, such as pursed lip breathing or other method taught during program session;Exhibits compliance with exercise, home  and travel O2 prescription;Compliance with respiratory medication;Demonstrates proper use of MDI's;Verbalizes importance of monitoring SPO2 with pulse oximeter and return demonstration Maintenance of O2 saturations>88%;Exhibits proper breathing techniques, such as pursed lip breathing or other method taught during program session;Exhibits compliance with exercise, home  and travel O2 prescription;Compliance with respiratory medication;Demonstrates proper use of MDI's;Verbalizes importance of monitoring SPO2 with pulse oximeter and return demonstration     Comments Muhsin went to the [place] yesterday where they did a stress test to assess his breathing. [facility] will [facility]. We also  reviewed PLB with the patient. Alastor is good about watching his saturations and using his PLB to help bring it back up.  His breathing is still his biggest limiting factor as it wears him out quickly.  He tries to work on breathing daily to help and good about using his inhalers too. Talbot is doing well in rehab.  He is feeling better with breathing, but it is his biggest limitation.  He does have oxygen to use at home for when he feels really SOB.  He is good about using his PLB and will monitor his saturations.  He uses his nebulizer on occassion.     Goals/Expected Outcomes Short: Receive results to stress test. Long: Become proficient with PLB. Short: Continue to work on PLB Long: continue to work  on breathing regularly Short: conitnue to use PLB routinely Long: conitnue to improve breathing.              Oxygen Discharge (Final Oxygen Re-Evaluation):  Oxygen Re-Evaluation - 07/26/22 1031       Program Oxygen Prescription   Program Oxygen Prescription None      Home Oxygen   Home Oxygen Device None    Sleep Oxygen Prescription None    Home Exercise Oxygen Prescription None    Home Resting Oxygen Prescription None      Goals/Expected Outcomes   Short Term Goals To learn and understand importance of monitoring SPO2 with pulse oximeter and demonstrate accurate use of the pulse oximeter.;To learn and understand importance of maintaining oxygen saturations>88%;To learn and demonstrate proper pursed lip breathing techniques or other breathing techniques. ;To learn and demonstrate proper use of respiratory medications;To learn and exhibit compliance with exercise, home and travel O2 prescription    Long  Term Goals Maintenance of O2 saturations>88%;Exhibits proper breathing techniques, such as pursed lip breathing or other method taught during program session;Exhibits compliance with exercise, home  and travel O2 prescription;Compliance with respiratory medication;Demonstrates proper use of MDI's;Verbalizes importance of monitoring SPO2 with pulse oximeter and return demonstration    Comments Jerimah is doing well in rehab.  He is feeling better with breathing, but it is his biggest limitation.  He does have oxygen to use at home for when he feels really SOB.  He is good about using his PLB and will monitor his saturations.  He uses his nebulizer on occassion.    Goals/Expected Outcomes Short: conitnue to use PLB routinely Long: conitnue to improve breathing.             Initial Exercise Prescription:  Initial Exercise Prescription - 05/26/22 1600       Date of Initial Exercise RX and Referring Provider   Date 05/26/22    Referring Provider [name] MD      Oxygen    Maintain Oxygen Saturation 88% or higher      Recumbant Bike   Level 1    RPM 60    Watts 15    Minutes 15    METs 1.1      T5 Nustep   Level 1    SPM 80    Minutes 15    METs 1.1      Biostep-RELP   Level 1    SPM 50    Minutes 15    METs 1.1      Track   Laps 17    Minutes 15    METs 1.92      Prescription Details   Frequency (times per week)  2    Duration Progress to 30 minutes of continuous aerobic without signs/symptoms of physical distress      Intensity   THRR 40-80% of Max Heartrate 96 - 126    Ratings of Perceived Exertion 11-13    Perceived Dyspnea 0-4      Progression   Progression Continue to progress workloads to maintain intensity without signs/symptoms of physical distress.      Resistance Training   Training Prescription Yes    Weight 4 lb    Reps 10-15             Perform Capillary Blood Glucose checks as needed.  Exercise Prescription Changes:   Exercise Prescription Changes     Row Name 05/26/22 1600 06/14/22 1400 06/27/22 1200 07/11/22 1500 07/12/22 0900     Response to Exercise   Blood Pressure (Admit) 140/76 134/68 132/74 130/68 --   Blood Pressure (Exercise) 148/74 152/72 126/64 132/70 --   Blood Pressure (Exit) 134/72 130/64 100/64 108/62 --   Heart Rate (Admit) 66 bpm 68 bpm 74 bpm 74 bpm --   Heart Rate (Exercise) 94 bpm 96 bpm 97 bpm 101 bpm --   Heart Rate (Exit) 73 bpm 90 bpm 86 bpm 84 bpm --   Oxygen Saturation (Admit) 93 % 93 % 94 % 92 % --   Oxygen Saturation (Exercise) 86 % 88 % 88 % 88 % --   Oxygen Saturation (Exit) 93 % 91 % 93 % 94 % --   Rating of Perceived Exertion (Exercise) 13 15 13 13  --   Perceived Dyspnea (Exercise) 3 3 2 2  --   Symptoms Back pain 9/10, SOB none SOB, back pain SOB --   Comments walk test results First two full days of exercise -- -- --   Duration -- Progress to 30 minutes of  aerobic without signs/symptoms of physical distress Continue with 30 min of aerobic exercise without  signs/symptoms of physical distress. Continue with 30 min of aerobic exercise without signs/symptoms of physical distress. --   Intensity -- THRR unchanged THRR unchanged THRR unchanged --     Progression   Progression -- Continue to progress workloads to maintain intensity without signs/symptoms of physical distress. Continue to progress workloads to maintain intensity without signs/symptoms of physical distress. Continue to progress workloads to maintain intensity without signs/symptoms of physical distress. --   Average METs -- 1.94 2.04 2.11 --     Resistance Training   Training Prescription -- Yes Yes Yes --   Weight -- 4 lb 4 lb 6 lb --   Reps -- 10-15 10-15 10-15 --     Interval Training   Interval Training -- No No No --     Oxygen   Oxygen -- Continuous Continuous -- --     Recumbant Bike   Level -- 1 4 2  --   Watts -- 15 20 19  --   Minutes -- 15 15 15  --   METs -- -- 1.87 2.49 --     NuStep   Level -- -- -- 3 --   Minutes -- -- -- 15 --   METs -- -- -- 2 --     T5 Nustep   Level -- 2 3 2  --   Minutes -- 15 15 15  --   METs -- 1.8 2 1.9 --     Biostep-RELP   Level -- 2 2 -- --   Minutes -- 15 15 -- --   METs -- 2 2 -- --  Track   Laps -- 19 22 19  --   Minutes -- 15 15 15  --   METs -- 2.03 2.2 2.03 --     Home Exercise Plan   Plans to continue exercise at -- -- -- -- [employer] (comment)  [employer] and walking   Frequency -- -- -- -- Add 3 additional days to program exercise sessions.   Initial Home Exercises Provided -- -- -- -- 07/12/22     Oxygen   Maintain Oxygen Saturation -- 88% or higher 88% or higher 88% or higher --    Row Name 07/25/22 1100 08/11/22 1200           Response to Exercise   Blood Pressure (Admit) 132/64 116/62      Blood Pressure (Exit) 130/62 110/60      Heart Rate (Admit) 69 bpm 79 bpm      Heart Rate (Exercise) 101 bpm 100 bpm      Heart Rate (Exit) 78 bpm 91 bpm      Oxygen Saturation (Admit) 93 % 92 %      Oxygen  Saturation (Exercise) 89 % 85 %      Oxygen Saturation (Exit) 92 % 92 %      Rating of Perceived Exertion (Exercise) 13 13      Perceived Dyspnea (Exercise) 2 2      Symptoms SOB SOB      Duration Continue with 30 min of aerobic exercise without signs/symptoms of physical distress. Continue with 30 min of aerobic exercise without signs/symptoms of physical distress.      Intensity THRR unchanged THRR unchanged        Progression   Progression Continue to progress workloads to maintain intensity without signs/symptoms of physical distress. Continue to progress workloads to maintain intensity without signs/symptoms of physical distress.      Average METs 2.32 2.08        Resistance Training   Training Prescription Yes Yes      Weight 6 lb 6 lb      Reps 10-15 10-15        Interval Training   Interval Training No No        Treadmill   MPH 1 --      Grade 0 --      Minutes 15 --      METs 1.8 --        Recumbant Bike   Level 3 3      Watts 27 22      Minutes 15 15      METs 2.71 2.57        Arm Ergometer   Level -- 1      Minutes -- 15      METs -- 1        T5 Nustep   Level 3 3      Minutes 15 15      METs 2 2        Biostep-RELP   Level 3 3      Minutes 15 15      METs 3 --        Track   Laps 20 26      Minutes 15 15      METs 2.09 2.41        Home Exercise Plan   Plans to continue exercise at [employer] (comment)  [employer] and walking [employer] (comment)  [employer] and walking  Frequency Add 3 additional days to program exercise sessions. Add 3 additional days to program exercise sessions.      Initial Home Exercises Provided 07/12/22 07/12/22        Oxygen   Maintain Oxygen Saturation 88% or higher 88% or higher               Exercise Comments:   Exercise Goals and Review:   Exercise Goals     Row Name 05/26/22 1618             Exercise Goals   Increase Physical Activity Yes       Intervention Provide advice, education,  support and counseling about physical activity/exercise needs.;Develop an individualized exercise prescription for aerobic and resistive training based on initial evaluation findings, risk stratification, comorbidities and participant's personal goals.       Expected Outcomes Short Term: Attend rehab on a regular basis to increase amount of physical activity.;Long Term: Add in home exercise to make exercise part of routine and to increase amount of physical activity.;Long Term: Exercising regularly at least 3-5 days a week.       Increase Strength and Stamina Yes       Intervention Provide advice, education, support and counseling about physical activity/exercise needs.;Develop an individualized exercise prescription for aerobic and resistive training based on initial evaluation findings, risk stratification, comorbidities and participant's personal goals.       Expected Outcomes Short Term: Increase workloads from initial exercise prescription for resistance, speed, and METs.;Short Term: Perform resistance training exercises routinely during rehab and add in resistance training at home;Long Term: Improve cardiorespiratory fitness, muscular endurance and strength as measured by increased METs and functional capacity ( )       Able to understand and use rate of perceived exertion (RPE) scale Yes       Intervention Provide education and explanation on how to use RPE scale       Expected Outcomes Short Term: Able to use RPE daily in rehab to express subjective intensity level;Long Term:  Able to use RPE to guide intensity level when exercising independently       Able to understand and use Dyspnea scale Yes       Intervention Provide education and explanation on how to use Dyspnea scale       Expected Outcomes Short Term: Able to use Dyspnea scale daily in rehab to express subjective sense of shortness of breath during exertion;Long Term: Able to use Dyspnea scale to guide intensity level when exercising  independently       Knowledge and understanding of Target Heart Rate Range (THRR) Yes       Intervention Provide education and explanation of THRR including how the numbers were predicted and where they are located for reference       Expected Outcomes Short Term: Able to state/look up THRR;Short Term: Able to use daily as guideline for intensity in rehab;Long Term: Able to use THRR to govern intensity when exercising independently       Able to check pulse independently Yes       Intervention Provide education and demonstration on how to check pulse in carotid and radial arteries.;Review the importance of being able to check your own pulse for safety during independent exercise       Expected Outcomes Short Term: Able to explain why pulse checking is important during independent exercise;Long Term: Able to check pulse independently and accurately       Understanding of Exercise Prescription  Yes       Intervention Provide education, explanation, and written materials on patient's individual exercise prescription       Expected Outcomes Short Term: Able to explain program exercise prescription;Long Term: Able to explain home exercise prescription to exercise independently                Exercise Goals Re-Evaluation :  Exercise Goals Re-Evaluation     Row Name 06/14/22 1505 06/16/22 1002 06/27/22 1213 07/11/22 1523 07/12/22 0952     Exercise Goal Re-Evaluation   Exercise Goals Review Increase Physical Activity;Increase Strength and Stamina;Understanding of Exercise Prescription Increase Physical Activity;Increase Strength and Stamina;Understanding of Exercise Prescription Increase Physical Activity;Increase Strength and Stamina;Understanding of Exercise Prescription Increase Physical Activity;Increase Strength and Stamina;Understanding of Exercise Prescription Increase Physical Activity;Increase Strength and Stamina;Understanding of Exercise Prescription   Comments Barnie is off to a good  start in the program. He had an average MET level of 1.94 METs during his first two sessions in the program. He also was able to work at level 2 on the biostep and T4 Nustep. He walked up to 19 laps on the track as well. We will continue to monitor his progress in the program. Kayode states that he feels he is doing well in the program. He states that he is able to move a lot better since starting the program. This is the patients second time in the program and he states that he knows the program is beneficial, so he looks forward to the long term benefits of staying consistent with his exercise in the program. Patient states that he has been doing some resistance training with hand weights at home on his days away from rehab. He was encouraged to begin walking some on his days away from rehab. He stated that he and his wife plan to join the [facility] to begin exercising together. We will continue to monitor his progress in the program. Anton continues to do well in rehab. He increased to level 3 on the T5 Nustep and up to level 4 on the recumbent bike. He is limited with his walking due to chronic back pain, however, he was still able to walk 22 laps on the track, which is his highest yet.  Oxygen saturations are staying 88% or higher. We will continue to monitor. Garfield is doing well in rehab. He has continued to work at level 2 on the T5 nustep and recumbent bike, and level 3 on the T4 nustep. He also has continued to walk between 19-22 laps on the track as well. He also increased to 6 lb hand weights for resistance training. We will continue to monitor his progress in the program. Adit is doing well in rehab.  He is walking some on his off days when it is warmer out.  They are planning to join [employer] with [employer].  He said his breathing does better inside than outside especially in the spring.  He is getting more stamina, but wants to lose weight. Reviewed home exercise with pt today.  Pt plans to walk  and join [facility] for exercise.  Reviewed THR, pulse, RPE, sign and symptoms, pulse oximetery and when to call 911 or MD.  Also discussed weather considerations and indoor options.  Pt voiced understanding.   Expected Outcomes Short: Continue to follow current exercise prescription. Long: Continue to improve strength and stamina. Short: Begin going to the [employer] to exercise on days away from rehab. Long: Continue to improve strength and stamina.  Short: Continue to increase laps on track as tolerated with back Long: Continue to increase overall MET level and stamina Short: Continue to increase laps on track. Long: Continue to improve strength and stamina. Short: Join [employer]  Long: Exercise on off days consistently    Row Name 07/25/22 1158 07/26/22 1022 08/11/22 1203         Exercise Goal Re-Evaluation   Exercise Goals Review Increase Physical Activity;Increase Strength and Stamina;Understanding of Exercise Prescription Increase Physical Activity;Increase Strength and Stamina;Understanding of Exercise Prescription Increase Physical Activity;Increase Strength and Stamina;Understanding of Exercise Prescription     Comments Flem continues to do well in rehab. He did increase to 20 laps on the track, which is his highest yet. He also increased to level 3 on the T5 Nustep as well as Biostep. On the recumbent bike, he worked up to 27 watts. His osyxgen levels are staying above 88%. We will continue to monitor. Hawley is doing well in rehab.  He is good about doing his hand weights at home.  His wife makes him get up and go for walks.  She will also take him when she goes to store so that he can walk while she does her shopping.  He does feel like his stamina is getting better. Makaveli is doing well in rehab. He recently increased his laps on the track, walking up to 26 laps on the track. He also has stayed consistent with his workloads on seated machines at level 3 on the biostep, T5 nustep, and recumbent bike. He  also has continued to tolerate 6 lb hand weights for resistance training. We will continue to monitor his progress.     Expected Outcomes Short: Continue to increase laps on track Long: Continue to increase overall MET level and stamina Short: Continue to walk on his off days Long: Continue to improve stamina Short: Continue to progressively increase workloads on seated machines. Long: Continue to improve strength and stamina.              Discharge Exercise Prescription (Final Exercise Prescription Changes):  Exercise Prescription Changes - 08/11/22 1200       Response to Exercise   Blood Pressure (Admit) 116/62    Blood Pressure (Exit) 110/60    Heart Rate (Admit) 79 bpm    Heart Rate (Exercise) 100 bpm    Heart Rate (Exit) 91 bpm    Oxygen Saturation (Admit) 92 %    Oxygen Saturation (Exercise) 85 %    Oxygen Saturation (Exit) 92 %    Rating of Perceived Exertion (Exercise) 13    Perceived Dyspnea (Exercise) 2    Symptoms SOB    Duration Continue with 30 min of aerobic exercise without signs/symptoms of physical distress.    Intensity THRR unchanged      Progression   Progression Continue to progress workloads to maintain intensity without signs/symptoms of physical distress.    Average METs 2.08      Resistance Training   Training Prescription Yes    Weight 6 lb    Reps 10-15      Interval Training   Interval Training No      Recumbant Bike   Level 3    Watts 22    Minutes 15    METs 2.57      Arm Ergometer   Level 1    Minutes 15    METs 1      T5 Nustep   Level 3    Minutes 15  METs 2      Biostep-RELP   Level 3    Minutes 15      Track   Laps 26    Minutes 15    METs 2.41      Home Exercise Plan   Plans to continue exercise at [employer] (comment)   [employer] and walking   Frequency Add 3 additional days to program exercise sessions.    Initial Home Exercises Provided 07/12/22      Oxygen   Maintain Oxygen Saturation 88% or higher              Nutrition:  Target Goals: Understanding of nutrition guidelines, daily intake of sodium 1500mg , cholesterol 200mg , calories 30% from fat and 7% or less from saturated fats, daily to have 5 or more servings of fruits and vegetables.  Education: All About Nutrition: -Group instruction provided by verbal, written material, interactive activities, discussions, models, and posters to present general guidelines for heart healthy nutrition including fat, fiber, MyPlate, the role of sodium in heart healthy nutrition, utilization of the nutrition label, and utilization of this knowledge for meal planning. Follow up email sent as well. Written material given at graduation. Flowsheet Row Pulmonary Rehab from 10/08/2020 in [facility] Cardiac and Pulmonary Rehab  Date 09/10/20  Educator [facility]  Instruction Review Code 1- Verbalizes Understanding       Biometrics:  Pre Biometrics - 05/26/22 1553       Pre Biometrics   Height 5\' 11"  (1.803 m)    Weight 270 lb 8 oz (122.7 kg)    Waist Circumference 55 inches    Hip Circumference 48.5 inches    Waist to Hip Ratio 1.13 %    BMI (Calculated) 37.74    Single Leg Stand 3.2 seconds              Nutrition Therapy Plan and Nutrition Goals:  Nutrition Therapy & Goals - 05/26/22 1547       Nutrition Therapy   RD appointment deferred Yes   Deferred     Intervention Plan   Intervention Prescribe, educate and counsel regarding individualized specific dietary modifications aiming towards targeted core components such as weight, hypertension, lipid management, diabetes, heart failure and other comorbidities.    Expected Outcomes Short Term Goal: Understand basic principles of dietary content, such as calories, fat, sodium, cholesterol and nutrients.;Short Term Goal: A plan has been developed with personal nutrition goals set during dietitian appointment.;Long Term Goal: Adherence to prescribed nutrition plan.             Nutrition  Assessments:  MEDIFICTS Score Key: ?70 Need to make dietary changes  40-70 Heart Healthy Diet ? 40 Therapeutic Level Cholesterol Diet  Flowsheet Row Pulmonary Rehab from 05/26/2022 in [facility] Cardiac and Pulmonary Rehab  Picture Your Plate Total Score on Admission 59      Picture Your Plate Scores: <09 Unhealthy dietary pattern with much room for improvement. 41-50 Dietary pattern unlikely to meet recommendations for good health and room for improvement. 51-60 More healthful dietary pattern, with some room for improvement.  >60 Healthy dietary pattern, although there may be some specific behaviors that could be improved.   Nutrition Goals Re-Evaluation:  Nutrition Goals Re-Evaluation     Row Name 06/16/22 1014 07/12/22 0958 07/26/22 1027         Goals   Nutrition Goal -- Continue to work on healthy eating. Short: Work on portion control Long: Continue to eat heart healthy  Comment Goran denies wanting to meet with the RD at this time. However, he states that his wife has helped him work on healthy eating. They attended the nutrition education classes the last time he was in the program and have implemented the things that they learned. Jens continues to [employer].  His wife makes sure that he eats good but he admits to over eating on occasion and is trying to cut back.  His wife usually cooks at home and tries to get a good variety.  He needs to work on portion control. Unnamed is doing well in rehab.  He is watching his diet and his wife stays on him about it.  He has backed off of added sugars and sweets.  He would like to lose weight.  He is still working on portion sizes and his wife has really cut him back to help.     Expected Outcome Continue to work on healthy eating. Short: Work on portion control Long: Continue to eat heart healthy Short: Continue to work on portion control Long: conitnue to work on [employer]              Nutrition Goals Discharge  (Final Nutrition Goals Re-Evaluation):  Nutrition Goals Re-Evaluation - 07/26/22 1027       Goals   Nutrition Goal Short: Work on portion control Long: Continue to eat heart healthy    Comment Ahmeer is doing well in rehab.  He is watching his diet and his wife stays on him about it.  He has backed off of added sugars and sweets.  He would like to lose weight.  He is still working on portion sizes and his wife has really cut him back to help.    Expected Outcome Short: Continue to work on portion control Long: conitnue to work on [employer]             Psychosocial: Target Goals: Acknowledge presence or absence of significant depression and/or stress, maximize coping skills, provide positive support system. Participant is able to verbalize types and ability to use techniques and skills needed for reducing stress and depression.   Education: Stress, Anxiety, and Depression - Group verbal and visual presentation to define topics covered.  Reviews how body is impacted by stress, anxiety, and depression.  Also discusses healthy ways to reduce stress and to treat/manage anxiety and depression.  Written material given at graduation. Flowsheet Row Pulmonary Rehab from 10/08/2020 in [facility] Cardiac and Pulmonary Rehab  Date 10/08/20  Educator [facility]  Instruction Review Code 1- [employer] Understanding       Education: Sleep Hygiene -Provides group verbal and written instruction about how sleep can affect your health.  Define sleep hygiene, discuss sleep cycles and impact of sleep habits. Review good sleep hygiene tips.    Initial Review & Psychosocial Screening:  Initial Psych Review & Screening - 05/17/22 1410       Initial Review   Current issues with Current Stress Concerns    Source of Stress Concerns Unable to participate in former interests or hobbies;Unable to perform yard/household activities;Chronic Illness      Family Dynamics   Good Support System? Yes   wife     Barriers    Psychosocial barriers to participate in program There are no identifiable barriers or psychosocial needs.;The patient should benefit from training in stress management and relaxation.      Screening Interventions   Interventions Encouraged to exercise;Provide feedback about the scores to participant;To  provide support and resources with identified psychosocial needs    Expected Outcomes Short Term goal: Utilizing psychosocial counselor, staff and physician to assist with identification of specific Stressors or current issues interfering with healing process. Setting desired goal for each stressor or current issue identified.;Long Term Goal: Stressors or current issues are controlled or eliminated.;Short Term goal: Identification and review with participant of any Quality of Life or Depression concerns found by scoring the questionnaire.;Long Term goal: The participant improves quality of Life and PHQ9 Scores as seen by post scores and/or verbalization of changes             Quality of Life Scores:  Scores of 19 and below usually indicate a poorer quality of life in these areas.  A difference of  2-3 points is a clinically meaningful difference.  A difference of 2-3 points in the total score of the Quality of Life Index has been associated with significant improvement in overall quality of life, self-image, physical symptoms, and general health in studies assessing change in quality of life.  PHQ-9: Review Flowsheet  More data exists      05/26/2022 01/26/2021 08/31/2020 10/20/2015 10/16/2015  Depression screen PHQ 2/9  Decreased Interest 1 1 1  0 0  Down, Depressed, Hopeless 0 0 0 0 0  PHQ - 2 Score 1 1 1  0 0  Altered sleeping 0 1 0 - -  Tired, decreased energy 3 2 1  - -  Change in appetite 0 2 0 - -  Feeling bad or failure about yourself  0 0 0 - -  Trouble concentrating 0 0 0 - -  Moving slowly or fidgety/restless 0 0 0 - -  Suicidal thoughts 0 0 0 - -  PHQ-9 Score 4 6 2  - -  Difficult  doing work/chores Not difficult at all Somewhat difficult Not difficult at all - -   Interpretation of Total Score  Total Score Depression Severity:  1-4 = Minimal depression, 5-9 = Mild depression, 10-14 = Moderate depression, 15-19 = Moderately severe depression, 20-27 = Severe depression   Psychosocial Evaluation and Intervention:  Psychosocial Evaluation - 05/17/22 1416       Psychosocial Evaluation & Interventions   Interventions Encouraged to exercise with the program and follow exercise prescription    Comments 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 He has done the program before and is familiar with what to expect. 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 His wife is his main support system and is very involved in his health care. She states that his doctor wants him to lose weight and thinks that will help with his breathing and afib. She is concerned about his stamina. He mentioned she probably knows more about his health than he does. He does want to increase his stamina and feel stronger while improving his breathing. His health journey has been rough these last few years and he wants to focus on feeling better.    Expected Outcomes Short: attend pulmonary rehab for education and exercise. Long: Develop and maintain positive self care habits.    Continue Psychosocial Services  Follow up required by staff             Psychosocial Re-Evaluation:  Psychosocial Re-Evaluation     Row Name 06/16/22 1010 07/12/22 0956 07/26/22 1025         Psychosocial Re-Evaluation   Current issues with Current Stress Concerns Current Stress Concerns Current Stress Concerns  Comments Stryder is doing well mentally. He denies any depression or anxiety at this time. He does state that he experiences some stress due to his health concerns. He does not like being unable to do the things he wants to do. He reports sleeping well at this time. He  reports having a good support system at home made up by his whole family. He does state that coming to rehab has helped his mental state because he knows he is working towards getting better. Jionni i s doing well in rehab.  He denies any major stressors or symptoms of depression. His health is his biggest stress.  He has also gotten frustrated over his hearing aids not working right and trying to get back into [place] to be seen.  He continues to have a good support system at home. He sleeps well and says sometimes he sleeps too much when he naps in his chair. Stokely is doing well in rehab.  His hearing is still one of his biggest issues. His hearing aids were unprogrammed and went to [place] yesterday and hasn't really noticed much difference.  His wife is his biggest supporter.  He tries not to let anything get to him too much.  He continues to sleep well.     Expected Outcomes Short: Continue to exercise for mental boost. Long: Maintain positive outlook. Short: Exercise more for mental boost Long: Continue to stay positive Short: Continue to get used to hearing aids Long: conitnue to stay positive     Interventions Encouraged to attend Pulmonary Rehabilitation for the exercise Encouraged to attend Pulmonary Rehabilitation for the exercise Encouraged to attend Pulmonary Rehabilitation for the exercise     Continue Psychosocial Services  Follow up required by staff Follow up required by staff Follow up required by staff       Initial Review   Source of Stress Concerns Unable to participate in former interests or hobbies;Unable to perform yard/household activities;Chronic Illness -- --              Psychosocial Discharge (Final Psychosocial Re-Evaluation):  Psychosocial Re-Evaluation - 07/26/22 1025       Psychosocial Re-Evaluation   Current issues with Current Stress Concerns    Comments Aldair is doing well in rehab.  His hearing is still one of his biggest issues. His hearing aids were unprogrammed  and went to [place] yesterday and hasn't really noticed much difference.  His wife is his biggest supporter.  He tries not to let anything get to him too much.  He continues to sleep well.    Expected Outcomes Short: Continue to get used to hearing aids Long: conitnue to stay positive    Interventions Encouraged to attend Pulmonary Rehabilitation for the exercise    Continue Psychosocial Services  Follow up required by staff             Education: Education Goals: Education classes will be provided on a weekly basis, covering required topics. Participant will state understanding/return demonstration of topics presented.  Learning Barriers/Preferences:  Learning Barriers/Preferences - 05/17/22 1408       Learning Barriers/Preferences   Learning Barriers Hearing    Learning Preferences None             General Pulmonary Education Topics:  Infection Prevention: - Provides verbal and written material to individual with discussion of infection control including proper hand washing and proper equipment cleaning during exercise session. Flowsheet Row Pulmonary Rehab from 06/16/2022 in [facility] Cardiac and Pulmonary Rehab  Education need identified 05/26/22  Date 05/26/22  Educator KW  Instruction Review Code 1- Verbalizes Understanding       Falls Prevention: - Provides verbal and written material to individual with discussion of falls prevention and safety. Flowsheet Row Pulmonary Rehab from 06/16/2022 in [facility] Cardiac and Pulmonary Rehab  Education need identified 05/26/22  Date 05/26/22  Educator KW  Instruction Review Code 1- Verbalizes Understanding       Chronic Lung Disease Review: - Group verbal instruction with posters, models, PowerPoint presentations and videos,  to review new updates, new respiratory medications, new advancements in procedures and treatments. Providing information on websites and "800" numbers for continued self-education. Includes information about  supplement oxygen, available portable oxygen systems, continuous and intermittent flow rates, oxygen safety, concentrators, and Medicare reimbursement for oxygen. Explanation of Pulmonary Drugs, including class, frequency, complications, importance of spacers, rinsing mouth after steroid MDI's, and proper cleaning methods for nebulizers. Review of basic lung anatomy and physiology related to function, structure, and complications of lung disease. Review of risk factors. Discussion about methods for diagnosing sleep apnea and types of masks and machines for OSA. Includes a review of the use of types of environmental controls: home humidity, furnaces, filters, dust mite/pet prevention, HEPA vacuums. Discussion about weather changes, air quality and the benefits of nasal washing. Instruction on Warning signs, infection symptoms, calling MD promptly, preventive modes, and value of vaccinations. Review of effective airway clearance, coughing and/or vibration techniques. Emphasizing that all should Create an Action Plan. Written material given at graduation. Flowsheet Row Pulmonary Rehab from 06/16/2022 in [facility] Cardiac and Pulmonary Rehab  Education need identified 05/26/22       AED/CPR: - Group verbal and written instruction with the use of models to demonstrate the basic use of the AED with the basic ABC's of resuscitation.    Anatomy and Cardiac Procedures: - Group verbal and visual presentation and models provide information about basic cardiac anatomy and function. Reviews the testing methods done to diagnose heart disease and the outcomes of the test results. Describes the treatment choices: Medical Management, Angioplasty, or Coronary Bypass Surgery for treating various heart conditions including Myocardial Infarction, Angina, Valve Disease, and Cardiac Arrhythmias.  Written material given at graduation.   Medication Safety: - Group verbal and visual instruction to review commonly prescribed  medications for heart and lung disease. Reviews the medication, class of the drug, and side effects. Includes the steps to properly store meds and maintain the prescription regimen.  Written material given at graduation. Flowsheet Row Pulmonary Rehab from 10/08/2020 in [facility] Cardiac and Pulmonary Rehab  Date 09/17/20  Educator SB  Instruction Review Code 1- Verbalizes Understanding       Other: -Provides group and verbal instruction on various topics (see comments)   Knowledge Questionnaire Score:  Knowledge Questionnaire Score - 05/26/22 1536       Knowledge Questionnaire Score   Pre Score 17/18              Core Components/Risk Factors/Patient Goals at Admission:  Personal Goals and Risk Factors at Admission - 05/26/22 1618       Core Components/Risk Factors/Patient Goals on Admission    Weight Management Yes;Weight Loss;Obesity    Intervention Weight Management: Develop a combined nutrition and exercise program designed to reach desired caloric intake, while maintaining appropriate intake of nutrient and fiber, sodium and fats, and appropriate energy expenditure required for the weight goal.;Weight Management: Provide education and appropriate resources to help participant work on and  attain dietary goals.;Weight Management/Obesity: Establish reasonable short term and long term weight goals.;Obesity: Provide education and appropriate resources to help participant work on and attain dietary goals.    Admit Weight 270 lb (122.5 kg)    Goal Weight: Short Term 265 lb (120.2 kg)    Goal Weight: Long Term 230 lb (104.3 kg)    Expected Outcomes Short Term: Continue to assess and modify interventions until short term weight is achieved;Long Term: Adherence to nutrition and physical activity/exercise program aimed toward attainment of established weight goal;Weight Loss: Understanding of general recommendations for a balanced deficit meal plan, which promotes 1-2 lb weight loss per week  and includes a negative energy balance of [phone] kcal/d;Understanding recommendations for meals to include 15-35% energy as protein, 25-35% energy from fat, 35-60% energy from carbohydrates, less than 200mg  of dietary cholesterol, 20-35 gm of total fiber daily;Understanding of distribution of calorie intake throughout the day with the consumption of 4-5 meals/snacks    Improve shortness of breath with ADL's Yes    Intervention Provide education, individualized exercise plan and daily activity instruction to help decrease symptoms of SOB with activities of daily living.    Expected Outcomes Short Term: Improve cardiorespiratory fitness to achieve a reduction of symptoms when performing ADLs;Long Term: Be able to perform more ADLs without symptoms or delay the onset of symptoms    Diabetes Yes    Intervention Provide education about signs/symptoms and action to take for hypo/hyperglycemia.;Provide education about proper nutrition, including hydration, and aerobic/resistive exercise prescription along with prescribed medications to achieve blood glucose in normal ranges: Fasting glucose 65-99 mg/dL    Expected Outcomes Short Term: Participant verbalizes understanding of the signs/symptoms and immediate care of hyper/hypoglycemia, proper foot care and importance of medication, aerobic/resistive exercise and nutrition plan for blood glucose control.;Long Term: Attainment of HbA1C < 7%.    Hypertension Yes    Intervention Provide education on lifestyle modifcations including regular physical activity/exercise, weight management, moderate sodium restriction and increased consumption of fresh fruit, vegetables, and low fat dairy, alcohol moderation, and smoking cessation.;Monitor prescription use compliance.    Expected Outcomes Short Term: Continued assessment and intervention until BP is < 140/35mm HG in hypertensive participants. < 130/46mm HG in hypertensive participants with diabetes, heart failure or  chronic kidney disease.;Long Term: Maintenance of blood pressure at goal levels.             Education:Diabetes - Individual verbal and written instruction to review signs/symptoms of diabetes, desired ranges of glucose level fasting, after meals and with exercise. Acknowledge that pre and post exercise glucose checks will be done for 3 sessions at entry of program. Flowsheet Row Pulmonary Rehab from 05/17/2022 in [facility] Cardiac and Pulmonary Rehab  Date 05/17/22  Educator [facility]  Instruction Review Code 1- Verbalizes Understanding       Know Your Numbers and Heart Failure: - Group verbal and visual instruction to discuss disease risk factors for cardiac and pulmonary disease and treatment options.  Reviews associated critical values for Overweight/Obesity, Hypertension, Cholesterol, and Diabetes.  Discusses basics of heart failure: signs/symptoms and treatments.  Introduces Heart Failure Zone chart for action plan for heart failure.  Written material given at graduation. Flowsheet Row Pulmonary Rehab from 10/08/2020 in [facility] Cardiac and Pulmonary Rehab  Date 09/24/20  Educator SB  Instruction Review Code 1- Verbalizes Understanding       Core Components/Risk Factors/Patient Goals Review:   Goals and Risk Factor Review     Row Name 06/16/22 1016 07/12/22  1001 07/26/22 1029         Core Components/Risk Factors/Patient Goals Review   Personal Goals Review Weight Management/Obesity;Diabetes;Hypertension Weight Management/Obesity;Diabetes;Hypertension;Improve shortness of breath with ADL's;Increase knowledge of respiratory medications and ability to use respiratory devices properly. Weight Management/Obesity;Diabetes;Hypertension;Improve shortness of breath with ADL's;Increase knowledge of respiratory medications and ability to use respiratory devices properly.     Review Dewel states that he would like to lose a little weight. He is currently at 267.1 lbs but would like to get down to  around 230 lbs. Jaki has been checking his blood sugars at home around once a week and states that they have been within normal ranges. He also reports checking his BP at home and states that it has been within normal ranges as well. Gaston's weight has started to creep back up again.  He wants to be more active to help with weight loss but he gets SOB quickly from his afib.  They wants him to try a new medication but it would require him to be in hospital to start and if he misses a dose he would be back in hospital.  He does not want that kind of stress and is trying to learn to live with it.  He is doing well with his inhaler and medications.  He does have oxygen to use at home as needed, but he has not needed it very often.  His pressures have been good and blood sugars have been good too. Quintrell continues to work on his weight loss.  He is doing better with his diet but he now knows that he needs to move more.  He and his wife are planning to join [facility] after graduation to keep exercising.  His breathing is getting better, but he still gets SOB, which is his biggest limitations.  His sugars are doing well as are pressures.  He continues to use his inhaler  and seems to work well for him. He is also using his nebulizer when he has bad breathing days.  He does have oxygen for prn when he gets really SOB.     Expected Outcomes Short: Continue to work towards weight goal. Long: Continue to monitor lifestyle risk factors. Short: Conitnue to work on weight loss by moving more Long: Continue to monitor risk factors Short: conitnue to work on moving more to help with weight loss Long: Continue to monitor risk factors              Core Components/Risk Factors/Patient Goals at Discharge (Final Review):   Goals and Risk Factor Review - 07/26/22 1029       Core Components/Risk Factors/Patient Goals Review   Personal Goals Review Weight Management/Obesity;Diabetes;Hypertension;Improve shortness of breath  with ADL's;Increase knowledge of respiratory medications and ability to use respiratory devices properly.    Review Xylan continues to work on his weight loss.  He is doing better with his diet but he now knows that he needs to move more.  He and his wife are planning to join [facility] after graduation to keep exercising.  His breathing is getting better, but he still gets SOB, which is his biggest limitations.  His sugars are doing well as are pressures.  He continues to use his inhaler  and seems to work well for him. He is also using his nebulizer when he has bad breathing days.  He does have oxygen for prn when he gets really SOB.    Expected Outcomes Short: conitnue to work on moving more  to help with weight loss Long: Continue to monitor risk factors             ITP Comments:  ITP Comments     Row Name 05/17/22 1417 05/26/22 1611 06/22/22 1253 07/20/22 0821 08/17/22 0909   ITP Comments Initial phone call completed. Diagnosis can be found in [facility] 1/11. EP Orientation scheduled for Thursday 1/18 at 2pm. Completed and gym orientation. Initial ITP created and sent for review to Dr. Vida Rigger, , Medical Director. 30 day review completed. ITP sent to Dr. Jinny Sanders, Medical Director of  Pulmonary Rehab. Continue with ITP unless changes are made by physician. 30 Day review completed. Medical Director ITP review done, changes made as directed, and signed approval by Medical Director. 30 day review completed. ITP sent to Dr. Jinny Sanders, Medical Director of  Pulmonary Rehab. Continue with ITP unless changes are made by physician.            Comments: 30 day review

## 2022-08-18 ENCOUNTER — Encounter: Payer: Medicare Other | Admitting: *Deleted

## 2022-08-18 DIAGNOSIS — J439 Emphysema, unspecified: Secondary | ICD-10-CM

## 2022-08-18 NOTE — Progress Notes (Signed)
Daily Session Note  Patient Details  Name: Levi [name] MRN: [id number] Date of Birth: Oct 06, 1940 Referring Provider:   Doristine Devoid Pulmonary Rehab from 05/26/2022 in [facility] Cardiac and Pulmonary Rehab  Referring Provider [name] MD       Encounter Date: 08/18/2022  Check In:  Session Check In - 08/18/22 1008       Check-In   Supervising physician immediately available to respond to emergencies See telemetry face sheet for immediately available ER MD    Location [facility]-Cardiac & Pulmonary Rehab    Staff Present Lanny Hurst, RN, ADN;Jessica Juanetta Gosling, MA, RCEP, CCRP, CCET;Noah Tickle, BS, Exercise Physiologist    Virtual Visit No    Medication changes reported     No    Fall or balance concerns reported    No    Warm-up and Cool-down Performed on first and last piece of equipment    Resistance Training Performed Yes    VAD Patient? No    PAD/SET Patient? No      Pain Assessment   Currently in Pain? No/denies                Social History   Tobacco Use  Smoking Status Former   Packs/day: 0.25   Years: 62.00   Additional pack years: 0.00   Total pack years: 15.50   Types: Cigarettes   Quit date: 09/05/2018   Years since quitting: 3.9  Smokeless Tobacco Never    Goals Met:  Independence with exercise equipment Exercise tolerated well No report of concerns or symptoms today Strength training completed today  Goals Unmet:  Not Applicable  Comments: Pt able to follow exercise prescription today without complaint.  Will continue to monitor for progression.    Dr. Bethann Punches is Medical Director for [facility] Cardiac Rehabilitation.  Dr. Vida Rigger is Medical Director for [facility] Pulmonary Rehabilitation.

## 2022-08-23 ENCOUNTER — Encounter: Payer: Medicare Other | Admitting: *Deleted

## 2022-08-23 DIAGNOSIS — J439 Emphysema, unspecified: Secondary | ICD-10-CM | POA: Diagnosis not present

## 2022-08-23 NOTE — Progress Notes (Signed)
Daily Session Note  Patient Details  Name: Levi [name] MRN: [id number] Date of Birth: 01-03-1941 Referring Provider:   Doristine Devoid Pulmonary Rehab from 05/26/2022 in [facility] Cardiac and Pulmonary Rehab  Referring Provider [name] MD       Encounter Date: 08/23/2022  Check In:  Session Check In - 08/23/22 0940       Check-In   Supervising physician immediately available to respond to emergencies See telemetry face sheet for immediately available ER MD    Location [facility]-Cardiac & Pulmonary Rehab    Staff Present Lanny Hurst, RN, ADN;Jessica Juanetta Gosling, MA, RCEP, CCRP, Zackery Barefoot, MS, ACSM CEP, Exercise Physiologist    Virtual Visit No    Medication changes reported     No    Fall or balance concerns reported    No    Warm-up and Cool-down Performed on first and last piece of equipment    Resistance Training Performed Yes    VAD Patient? No    PAD/SET Patient? No      Pain Assessment   Currently in Pain? No/denies                Social History   Tobacco Use  Smoking Status Former   Packs/day: 0.25   Years: 62.00   Additional pack years: 0.00   Total pack years: 15.50   Types: Cigarettes   Quit date: 09/05/2018   Years since quitting: 3.9  Smokeless Tobacco Never    Goals Met:  Independence with exercise equipment Exercise tolerated well No report of concerns or symptoms today Strength training completed today  Goals Unmet:  Not Applicable  Comments: Pt able to follow exercise prescription today without complaint.  Will continue to monitor for progression.    Dr. Bethann Punches is Medical Director for [facility] Cardiac Rehabilitation.  Dr. Vida Rigger is Medical Director for [facility] Pulmonary Rehabilitation.

## 2022-08-25 ENCOUNTER — Encounter: Payer: Medicare Other | Admitting: *Deleted

## 2022-08-25 DIAGNOSIS — J439 Emphysema, unspecified: Secondary | ICD-10-CM

## 2022-08-25 NOTE — Progress Notes (Signed)
Daily Session Note  Patient Details  Name: Levi [name] MRN: [id number] Date of Birth: 24-Mar-1941 Referring Provider:   Doristine Devoid Pulmonary Rehab from 05/26/2022 in [facility] Cardiac and Pulmonary Rehab  Referring Provider [name] MD       Encounter Date: 08/25/2022  Check In:  Session Check In - 08/25/22 1043       Check-In   Supervising physician immediately available to respond to emergencies See telemetry face sheet for immediately available ER MD    Location [facility]-Cardiac & Pulmonary Rehab    Staff Present Lanny Hurst, RN, Silas Flood, BS, Exercise Physiologist;Denise Rhew, PhD, RN, CNS, CEN    Virtual Visit No    Medication changes reported     No    Fall or balance concerns reported    No    Warm-up and Cool-down Performed on first and last piece of equipment    Resistance Training Performed Yes    VAD Patient? No    PAD/SET Patient? No      Pain Assessment   Currently in Pain? No/denies                Social History   Tobacco Use  Smoking Status Former   Packs/day: 0.25   Years: 62.00   Additional pack years: 0.00   Total pack years: 15.50   Types: Cigarettes   Quit date: 09/05/2018   Years since quitting: 3.9  Smokeless Tobacco Never    Goals Met:  Independence with exercise equipment Exercise tolerated well No report of concerns or symptoms today Strength training completed today  Goals Unmet:  Not Applicable  Comments: Pt able to follow exercise prescription today without complaint.  Will continue to monitor for progression.    Dr. Bethann Punches is Medical Director for [facility] Cardiac Rehabilitation.  Dr. Vida Rigger is Medical Director for [facility] Pulmonary Rehabilitation.

## 2022-08-30 ENCOUNTER — Encounter: Payer: Medicare Other | Admitting: *Deleted

## 2022-08-30 DIAGNOSIS — J439 Emphysema, unspecified: Secondary | ICD-10-CM | POA: Diagnosis not present

## 2022-08-30 NOTE — Progress Notes (Signed)
Daily Session Note  Patient Details  Name: Levi [name] MRN: [id number] Date of Birth: 09/05/1940 Referring Provider:   Doristine Devoid Pulmonary Rehab from 05/26/2022 in [facility] Cardiac and Pulmonary Rehab  Referring Provider [name] MD       Encounter Date: 08/30/2022  Check In:  Session Check In - 08/30/22 1019       Check-In   Supervising physician immediately available to respond to emergencies See telemetry face sheet for immediately available ER MD    Location [facility]-Cardiac & Pulmonary Rehab    Staff Present Cora Collum, RN, BSN, CCRP;Jessica [place], MA, RCEP, CCRP, Zackery Barefoot, MS, ACSM CEP, Exercise Physiologist    Virtual Visit No    Medication changes reported     No    Fall or balance concerns reported    No    Warm-up and Cool-down Performed on first and last piece of equipment    Resistance Training Performed Yes    VAD Patient? No    PAD/SET Patient? No      Pain Assessment   Currently in Pain? No/denies                Social History   Tobacco Use  Smoking Status Former   Packs/day: 0.25   Years: 62.00   Additional pack years: 0.00   Total pack years: 15.50   Types: Cigarettes   Quit date: 09/05/2018   Years since quitting: 3.9  Smokeless Tobacco Never    Goals Met:  Proper associated with RPD/PD & O2 Sat Independence with exercise equipment Exercise tolerated well No report of concerns or symptoms today  Goals Unmet:  Not Applicable  Comments: Pt able to follow exercise prescription today without complaint.  Will continue to monitor for progression.    Dr. Bethann Punches is Medical Director for [facility] Cardiac Rehabilitation.  Dr. Vida Rigger is Medical Director for [facility] Pulmonary Rehabilitation.

## 2022-09-01 ENCOUNTER — Encounter: Payer: Medicare Other | Attending: Student in an Organized Health Care Education/Training Program | Admitting: *Deleted

## 2022-09-01 DIAGNOSIS — J439 Emphysema, unspecified: Secondary | ICD-10-CM

## 2022-09-01 NOTE — Progress Notes (Signed)
Daily Session Note  Patient Details  Name: Levi [name] MRN: [id number] Date of Birth: 08-Mar-1941 Referring Provider:   Doristine Devoid Pulmonary Rehab from 05/26/2022 in [facility] Cardiac and Pulmonary Rehab  Referring Provider [name] MD       Encounter Date: 09/01/2022  Check In:  Session Check In - 09/01/22 1006       Check-In   Supervising physician immediately available to respond to emergencies See telemetry face sheet for immediately available ER MD    Location [facility]-Cardiac & Pulmonary Rehab    Staff Present Susann Givens, RN BSN;Laureen Manson Passey, BS, RRT, CPFT;Jessica [place], MA, RCEP, CCRP, Zackery Barefoot, MS, ACSM CEP, Exercise Physiologist    Virtual Visit No    Medication changes reported     No    Fall or balance concerns reported    No    Warm-up and Cool-down Performed on first and last piece of equipment    Resistance Training Performed Yes    VAD Patient? No    PAD/SET Patient? No      Pain Assessment   Currently in Pain? No/denies                Social History   Tobacco Use  Smoking Status Former   Packs/day: 0.25   Years: 62.00   Additional pack years: 0.00   Total pack years: 15.50   Types: Cigarettes   Quit date: 09/05/2018   Years since quitting: 3.9  Smokeless Tobacco Never    Goals Met:  Independence with exercise equipment Exercise tolerated well No report of concerns or symptoms today Strength training completed today  Goals Unmet:  Not Applicable  Comments: Pt able to follow exercise prescription today without complaint.  Will continue to monitor for progression.    Dr. Bethann Punches is Medical Director for [facility] Cardiac Rehabilitation.  Dr. Vida Rigger is Medical Director for [facility] Pulmonary Rehabilitation.

## 2022-09-06 ENCOUNTER — Encounter: Payer: Medicare Other | Admitting: *Deleted

## 2022-09-06 DIAGNOSIS — J439 Emphysema, unspecified: Secondary | ICD-10-CM | POA: Diagnosis not present

## 2022-09-06 NOTE — Progress Notes (Signed)
Daily Session Note  Patient Details  Name: KIJON LITTAU MRN: [id number] Date of Birth: 1940/10/25 Referring Provider:   Doristine Devoid Pulmonary Rehab from 05/26/2022 in [facility] Cardiac and Pulmonary Rehab  Referring Provider [name] MD       Encounter Date: 09/06/2022  Check In:  Session Check In - 09/06/22 1048       Check-In   Supervising physician immediately available to respond to emergencies See telemetry face sheet for immediately available ER MD    Location [facility]-Cardiac & Pulmonary Rehab    Staff Present Cora Collum, RN, BSN, CCRP;Jessica [place], MA, RCEP, CCRP, Zackery Barefoot, MS, ACSM CEP, Exercise Physiologist    Virtual Visit No    Medication changes reported     No    Fall or balance concerns reported    No    Warm-up and Cool-down Performed on first and last piece of equipment    Resistance Training Performed Yes    VAD Patient? No    PAD/SET Patient? No      Pain Assessment   Currently in Pain? No/denies                Social History   Tobacco Use  Smoking Status Former   Packs/day: 0.25   Years: 62.00   Additional pack years: 0.00   Total pack years: 15.50   Types: Cigarettes   Quit date: 09/05/2018   Years since quitting: 4.0  Smokeless Tobacco Never    Goals Met:  Proper associated with RPD/PD & O2 Sat Independence with exercise equipment Exercise tolerated well No report of concerns or symptoms today  Goals Unmet:  Not Applicable  Comments: Pt able to follow exercise prescription today without complaint.  Will continue to monitor for progression.    Dr. Bethann Punches is Medical Director for [facility] Cardiac Rehabilitation.  Dr. Vida Rigger is Medical Director for [facility] Pulmonary Rehabilitation.

## 2022-09-08 ENCOUNTER — Encounter: Payer: Medicare Other | Admitting: *Deleted

## 2022-09-08 DIAGNOSIS — J439 Emphysema, unspecified: Secondary | ICD-10-CM | POA: Diagnosis not present

## 2022-09-08 NOTE — Progress Notes (Signed)
Daily Session Note  Patient Details  Name: Levi [name] MRN: [id number] Date of Birth: 1940/05/07 Referring Provider:   Doristine Devoid Pulmonary Rehab from 05/26/2022 in [facility] Cardiac and Pulmonary Rehab  Referring Provider [name] MD       Encounter Date: 09/08/2022  Check In:  Session Check In - 09/08/22 1048       Check-In   Supervising physician immediately available to respond to emergencies See telemetry face sheet for immediately available ER MD    Location [facility]-Cardiac & Pulmonary Rehab    Staff Present Lanny Hurst, RN, ADN;Jessica Juanetta Gosling, MA, RCEP, CCRP, Zackery Barefoot, MS, ACSM CEP, Exercise Physiologist    Virtual Visit No    Medication changes reported     No    Fall or balance concerns reported    No    Warm-up and Cool-down Performed on first and last piece of equipment    Resistance Training Performed Yes    VAD Patient? No    PAD/SET Patient? No      Pain Assessment   Currently in Pain? No/denies                Social History   Tobacco Use  Smoking Status Former   Packs/day: 0.25   Years: 62.00   Additional pack years: 0.00   Total pack years: 15.50   Types: Cigarettes   Quit date: 09/05/2018   Years since quitting: 4.0  Smokeless Tobacco Never    Goals Met:  Independence with exercise equipment Exercise tolerated well No report of concerns or symptoms today Strength training completed today  Goals Unmet:  Not Applicable  Comments: Pt able to follow exercise prescription today without complaint.  Will continue to monitor for progression.    Dr. Bethann Punches is Medical Director for [facility] Cardiac Rehabilitation.  Dr. Vida Rigger is Medical Director for [facility] Pulmonary Rehabilitation.

## 2022-09-13 ENCOUNTER — Encounter: Payer: Medicare Other | Admitting: *Deleted

## 2022-09-13 DIAGNOSIS — J439 Emphysema, unspecified: Secondary | ICD-10-CM

## 2022-09-13 NOTE — Progress Notes (Signed)
Daily Session Note  Patient Details  Name: MONTANNA HAMROCK MRN: [id number] Date of Birth: 1940-08-19 Referring Provider:   Doristine Devoid Pulmonary Rehab from 05/26/2022 in [facility] Cardiac and Pulmonary Rehab  Referring Provider [name] MD       Encounter Date: 09/13/2022  Check In:  Session Check In - 09/13/22 1118       Check-In   Supervising physician immediately available to respond to emergencies See telemetry face sheet for immediately available ER MD    Location [facility]-Cardiac & Pulmonary Rehab    Staff Present Cora Collum, RN, BSN, CCRP;Jessica [place], MA, RCEP, CCRP, Zackery Barefoot, MS, ACSM CEP, Exercise Physiologist    Virtual Visit No    Medication changes reported     No    Fall or balance concerns reported    No    Warm-up and Cool-down Performed on first and last piece of equipment    Resistance Training Performed Yes    VAD Patient? No    PAD/SET Patient? No      Pain Assessment   Currently in Pain? No/denies                Social History   Tobacco Use  Smoking Status Former   Packs/day: 0.25   Years: 62.00   Additional pack years: 0.00   Total pack years: 15.50   Types: Cigarettes   Quit date: 09/05/2018   Years since quitting: 4.0  Smokeless Tobacco Never    Goals Met:  Proper associated with RPD/PD & O2 Sat Independence with exercise equipment Exercise tolerated well No report of concerns or symptoms today  Goals Unmet:  Not Applicable  Comments: Pt able to follow exercise prescription today without complaint.  Will continue to monitor for progression.    Dr. Bethann Punches is Medical Director for [facility] Cardiac Rehabilitation.  Dr. Vida Rigger is Medical Director for [facility] Pulmonary Rehabilitation.

## 2022-09-14 ENCOUNTER — Encounter: Payer: Self-pay | Admitting: *Deleted

## 2022-09-14 DIAGNOSIS — J439 Emphysema, unspecified: Secondary | ICD-10-CM

## 2022-09-14 NOTE — Progress Notes (Signed)
Pulmonary Individual Treatment Plan  Patient Details  Name: REUBEN STAUDE MRN: [id number] Date of Birth: 24-Mar-1941 Referring Provider:   Doristine Devoid Pulmonary Rehab from 05/26/2022 in [facility] Cardiac and Pulmonary Rehab  Referring Provider [name] MD       Initial Encounter Date:  Flowsheet Row Pulmonary Rehab from 05/26/2022 in [facility] Cardiac and Pulmonary Rehab  Date 05/26/22       Visit Diagnosis: Pulmonary emphysema, unspecified emphysema type (HCC)  Patient's Home Medications on Admission:  Current Outpatient Medications:    albuterol (PROVENTIL HFA;VENTOLIN HFA) 108 (90 Base) MCG/ACT inhaler, Inhale 2 puffs into the lungs every 6 (six) hours as needed for wheezing or shortness of breath., Disp: 1 Inhaler, Rfl: 0   apixaban (ELIQUIS) 5 MG TABS tablet, Take 5 mg by mouth 2 (two) times daily., Disp: , Rfl:    carboxymethylcellulose (REFRESH PLUS) 0.5 % SOLN, 1 drop 4 (four) times daily as needed., Disp: , Rfl:    finasteride (PROSCAR) 5 MG tablet, Take 5 mg by mouth daily., Disp: , Rfl:    fluticasone-salmeterol (ADVAIR) 250-50 MCG/ACT AEPB, Inhale 1 puff into the lungs in the morning and at bedtime., Disp: , Rfl:    levothyroxine (SYNTHROID) 200 MCG tablet, Take 200 mcg by mouth daily before breakfast., Disp: , Rfl:    SEMAGLUTIDE,0.25 OR 0.5MG /DOS, [place], Inject 0.5 mg into the skin once a week., Disp: , Rfl:    tamsulosin (FLOMAX) 0.4 MG CAPS capsule, 0.8 mg daily., Disp: , Rfl:    Tiotropium Bromide Monohydrate (SPIRIVA RESPIMAT) 2.5 MCG/ACT AERS, Inhale 2 puffs into the lungs daily., Disp: 1 each, Rfl: 11   torsemide (DEMADEX) 20 MG tablet, Take 1 tablet (20 mg total) by mouth every other day., Disp: 45 tablet, Rfl: 1   vitamin B-12 (CYANOCOBALAMIN) 1000 MCG tablet, Take 1,000 mcg by mouth daily., Disp: , Rfl:   Past Medical History: Past Medical History:  Diagnosis Date   Asthma    COPD (chronic obstructive pulmonary disease) (HCC)    Hearing loss    Hypertension     Hypothyroidism    Mass of left lung    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HCC)    Prostate enlargement    Shortness of breath    Squamous cell carcinoma of lung, left (HCC)    Thyroid disease    Tobacco abuse     Tobacco Use: Social History   Tobacco Use  Smoking Status Former   Packs/day: 0.25   Years: 62.00   Additional pack years: 0.00   Total pack years: 15.50   Types: Cigarettes   Quit date: 09/05/2018   Years since quitting: 4.0  Smokeless Tobacco Never    Labs: Review Flowsheet       Latest Ref Rng & Units 10/15/2018 03/09/2021  Labs for ITP Cardiac and Pulmonary Rehab  Hemoglobin A1c 4.8 - 5.6 % - 7.8   PH, Arterial 7.350 - 7.450 7.42  -  PCO2 arterial 32.0 - 48.0 mmHg 35  -  Bicarbonate 20.0 - 28.0 mmol/L 22.7  -  Acid-base deficit 0.0 - 2.0 mmol/L 1.3  -  O2 Saturation % 93.1  -     Pulmonary Assessment Scores:  Pulmonary Assessment Scores     Row Name 05/26/22 1539         ADL UCSD   ADL Phase Entry     SOB Score total 77     Rest 0     Walk 3     Stairs  4     Bath 3     Dress 3     Shop 3       CAT Score   CAT Score 21       mMRC Score   mMRC Score 2              UCSD: Self-administered rating of dyspnea associated with activities of daily living (ADLs) 6-point scale (0 = "not at all" to 5 = "maximal or unable to do because of breathlessness")  Scoring Scores range from 0 to 120.  Minimally important difference is 5 units  CAT: CAT can identify the health impairment of COPD patients and is better correlated with disease progression.  CAT has a scoring range of zero to 40. The CAT score is classified into four groups of low (less than 10), medium (10 - 20), high (21-30) and very high (31-40) based on the impact level of disease on health status. A CAT score over 10 suggests significant symptoms.  A worsening CAT score could be explained by an exacerbation, poor medication adherence, poor inhaler technique, or progression of COPD or comorbid  conditions.  CAT MCID is 2 points  mMRC: mMRC (Modified Medical Research Council) Dyspnea Scale is used to assess the degree of baseline functional disability in patients of respiratory disease due to dyspnea. No minimal important difference is established. A decrease in score of 1 point or greater is considered a positive change.   Pulmonary Function Assessment:   Exercise Target Goals: Exercise Program Goal: Individual exercise prescription set using results from initial 6 min walk test and THRR while considering  patient's activity barriers and safety.   Exercise Prescription Goal: Initial exercise prescription builds to 30-45 minutes a day of aerobic activity, 2-3 days per week.  Home exercise guidelines will be given to patient during program as part of exercise prescription that the participant will acknowledge.  Education: Aerobic Exercise: - Group verbal and visual presentation on the components of exercise prescription. Introduces F.I.T.T principle from ACSM for exercise prescriptions.  Reviews F.I.T.T. principles of aerobic exercise including progression. Written material given at graduation.   Education: Resistance Exercise: - Group verbal and visual presentation on the components of exercise prescription. Introduces F.I.T.T principle from ACSM for exercise prescriptions  Reviews F.I.T.T. principles of resistance exercise including progression. Written material given at graduation.    Education: Exercise & Equipment Safety: - Individual verbal instruction and demonstration of equipment use and safety with use of the equipment. Flowsheet Row Pulmonary Rehab from 09/01/2022 in [facility] Cardiac and Pulmonary Rehab  Education need identified 05/26/22  Date 05/26/22  Educator KW  Instruction Review Code 1- Verbalizes Understanding       Education: Exercise Physiology & General Exercise Guidelines: - Group verbal and written instruction with models to review the exercise  physiology of the cardiovascular system and associated critical values. Provides general exercise guidelines with specific guidelines to those with heart or lung disease.    Education: Flexibility, Balance, Mind/Body Relaxation: - Group verbal and visual presentation with interactive activity on the components of exercise prescription. Introduces F.I.T.T principle from ACSM for exercise prescriptions. Reviews F.I.T.T. principles of flexibility and balance exercise training including progression. Also discusses the mind body connection.  Reviews various relaxation techniques to help reduce and manage stress (i.e. Deep breathing, progressive muscle relaxation, and visualization). Balance handout provided to take home. Written material given at graduation. Flowsheet Row Pulmonary Rehab from 10/08/2020 in [facility] Cardiac and Pulmonary Rehab  Date 09/03/20  Educator AS  Instruction Review Code 1- Verbalizes Understanding       Activity Barriers & Risk Stratification:  Activity Barriers & Cardiac Risk Stratification - 05/26/22 1552       Activity Barriers & Cardiac Risk Stratification   Activity Barriers Deconditioning;Shortness of Breath;Muscular Weakness;Back Problems    Comments Sciatica             6 Minute Walk:  6 Minute Walk     Row Name 05/26/22 1554         6 Minute Walk   Phase Initial     Distance 890 feet     Walk Time 6 minutes     # of Rest Breaks 0     MPH 1.68     METS 1.15     RPE 13     Perceived Dyspnea  3     VO2 Peak 4.03     Symptoms Yes (comment)     Comments Back pain caused from sciatica 9/10, SOB     Resting HR 66 bpm     Resting BP 140/76     Resting Oxygen Saturation  93 %     Exercise Oxygen Saturation  during 6 min walk 86 %     Max Ex. HR 94 bpm     Max Ex. BP 148/74     2 Minute Post BP 136/76       Interval HR   1 Minute HR 81     2 Minute HR 91     3 Minute HR 94     4 Minute HR 94     5 Minute HR 91     6 Minute HR 90     2 Minute  Post HR 93     Interval Heart Rate? Yes       Interval Oxygen   Interval Oxygen? Yes     Baseline Oxygen Saturation % 93 %     1 Minute Oxygen Saturation % 91 %     1 Minute Liters of Oxygen 0 L  RA     2 Minute Oxygen Saturation % 88 %     2 Minute Liters of Oxygen 0 L     3 Minute Oxygen Saturation % 87 %     3 Minute Liters of Oxygen 0 L     4 Minute Oxygen Saturation % 88 %     4 Minute Liters of Oxygen 0 L     5 Minute Oxygen Saturation % 87 %     5 Minute Liters of Oxygen 0 L     6 Minute Oxygen Saturation % 86 %     6 Minute Liters of Oxygen 0 L     2 Minute Post Oxygen Saturation % 93 %     2 Minute Post Liters of Oxygen 0 L             Oxygen Initial Assessment:  Oxygen Initial Assessment - 05/26/22 1538       Home Oxygen   Home Oxygen Device None   prn only- does not use currently   Sleep Oxygen Prescription None    Home Exercise Oxygen Prescription None    Home Resting Oxygen Prescription None    Compliance with Home Oxygen Use Yes   prn     Initial 6 min Walk   Oxygen Used None      Program Oxygen Prescription   Program Oxygen Prescription None   prn  Intervention   Short Term Goals To learn and understand importance of monitoring SPO2 with pulse oximeter and demonstrate accurate use of the pulse oximeter.;To learn and understand importance of maintaining oxygen saturations>88%;To learn and demonstrate proper pursed lip breathing techniques or other breathing techniques. ;To learn and demonstrate proper use of respiratory medications;To learn and exhibit compliance with exercise, home and travel O2 prescription    Long  Term Goals Maintenance of O2 saturations>88%;Exhibits proper breathing techniques, such as pursed lip breathing or other method taught during program session;Exhibits compliance with exercise, home  and travel O2 prescription;Compliance with respiratory medication;Demonstrates proper use of MDI's;Verbalizes importance of monitoring SPO2 with  pulse oximeter and return demonstration             Oxygen Re-Evaluation:  Oxygen Re-Evaluation     Row Name 06/16/22 1024 07/12/22 1004 07/26/22 1031 08/23/22 1026       Program Oxygen Prescription   Program Oxygen Prescription None None None None      Home Oxygen   Home Oxygen Device None None None None    Sleep Oxygen Prescription None None None None    Home Exercise Oxygen Prescription None None None None    Home Resting Oxygen Prescription None None None None    Compliance with Home Oxygen Use Yes Yes  uses as needed at home -- --      Goals/Expected Outcomes   Short Term Goals To learn and understand importance of monitoring SPO2 with pulse oximeter and demonstrate accurate use of the pulse oximeter.;To learn and understand importance of maintaining oxygen saturations>88%;To learn and demonstrate proper pursed lip breathing techniques or other breathing techniques. ;To learn and demonstrate proper use of respiratory medications;To learn and exhibit compliance with exercise, home and travel O2 prescription To learn and understand importance of monitoring SPO2 with pulse oximeter and demonstrate accurate use of the pulse oximeter.;To learn and understand importance of maintaining oxygen saturations>88%;To learn and demonstrate proper pursed lip breathing techniques or other breathing techniques. ;To learn and demonstrate proper use of respiratory medications;To learn and exhibit compliance with exercise, home and travel O2 prescription To learn and understand importance of monitoring SPO2 with pulse oximeter and demonstrate accurate use of the pulse oximeter.;To learn and understand importance of maintaining oxygen saturations>88%;To learn and demonstrate proper pursed lip breathing techniques or other breathing techniques. ;To learn and demonstrate proper use of respiratory medications;To learn and exhibit compliance with exercise, home and travel O2 prescription To learn and understand  importance of monitoring SPO2 with pulse oximeter and demonstrate accurate use of the pulse oximeter.;To learn and understand importance of maintaining oxygen saturations>88%;To learn and demonstrate proper pursed lip breathing techniques or other breathing techniques. ;To learn and demonstrate proper use of respiratory medications;To learn and exhibit compliance with exercise, home and travel O2 prescription    Long  Term Goals Maintenance of O2 saturations>88%;Exhibits proper breathing techniques, such as pursed lip breathing or other method taught during program session;Exhibits compliance with exercise, home  and travel O2 prescription;Compliance with respiratory medication;Demonstrates proper use of MDI's;Verbalizes importance of monitoring SPO2 with pulse oximeter and return demonstration Maintenance of O2 saturations>88%;Exhibits proper breathing techniques, such as pursed lip breathing or other method taught during program session;Exhibits compliance with exercise, home  and travel O2 prescription;Compliance with respiratory medication;Demonstrates proper use of MDI's;Verbalizes importance of monitoring SPO2 with pulse oximeter and return demonstration Maintenance of O2 saturations>88%;Exhibits proper breathing techniques, such as pursed lip breathing or other method taught during program session;Exhibits compliance with  exercise, home  and travel O2 prescription;Compliance with respiratory medication;Demonstrates proper use of MDI's;Verbalizes importance of monitoring SPO2 with pulse oximeter and return demonstration Maintenance of O2 saturations>88%;Exhibits proper breathing techniques, such as pursed lip breathing or other method taught during program session;Exhibits compliance with exercise, home  and travel O2 prescription;Compliance with respiratory medication;Demonstrates proper use of MDI's;Verbalizes importance of monitoring SPO2 with pulse oximeter and return demonstration    Comments Adley  went to the [place] yesterday where they did a stress test to assess his breathing. We will [facility] to [facility]. We also reviewed PLB with the patient. Arvie is good about watching his saturations and using his PLB to help bring it back up.  His breathing is still his biggest limiting factor as it wears him out quickly.  He tries to work on breathing daily to help and good about using his inhalers too. Charan is doing well in rehab.  He is feeling better with breathing, but it is his biggest limitation.  He does have oxygen to use at home for when he feels really SOB.  He is good about using his PLB and will monitor his saturations.  He uses his nebulizer on occassion. Daviyon continues to do well with monitoring his breathing and saturations.  His numbers continue to drop some with walking, but he will pause and use PLB to get it back up.  He contineus to use his inhalers and nebulizer.    Goals/Expected Outcomes Short: Receive results to stress test. Long: Become proficient with PLB. Short: Continue to work on PLB Long: continue to work on breathing regularly Short: conitnue to use PLB routinely Long: conitnue to improve breathing. Short: Contineu to use PLB and monitor saturations Long: Continue to manage pulmonary disease             Oxygen Discharge (Final Oxygen Re-Evaluation):  Oxygen Re-Evaluation - 08/23/22 1026       Program Oxygen Prescription   Program Oxygen Prescription None      Home Oxygen   Home Oxygen Device None    Sleep Oxygen Prescription None    Home Exercise Oxygen Prescription None    Home Resting Oxygen Prescription None      Goals/Expected Outcomes   Short Term Goals To learn and understand importance of monitoring SPO2 with pulse oximeter and demonstrate accurate use of the pulse oximeter.;To learn and understand importance of maintaining oxygen saturations>88%;To learn and demonstrate proper pursed lip breathing techniques or other breathing  techniques. ;To learn and demonstrate proper use of respiratory medications;To learn and exhibit compliance with exercise, home and travel O2 prescription    Long  Term Goals Maintenance of O2 saturations>88%;Exhibits proper breathing techniques, such as pursed lip breathing or other method taught during program session;Exhibits compliance with exercise, home  and travel O2 prescription;Compliance with respiratory medication;Demonstrates proper use of MDI's;Verbalizes importance of monitoring SPO2 with pulse oximeter and return demonstration    Comments Kaemon continues to do well with monitoring his breathing and saturations.  His numbers continue to drop some with walking, but he will pause and use PLB to get it back up.  He contineus to use his inhalers and nebulizer.    Goals/Expected Outcomes Short: Contineu to use PLB and monitor saturations Long: Continue to manage pulmonary disease             Initial Exercise Prescription:  Initial Exercise Prescription - 05/26/22 1600       Date of Initial Exercise  RX and Referring Provider   Date 05/26/22    Referring Provider [name] MD      Oxygen   Maintain Oxygen Saturation 88% or higher      Recumbant Bike   Level 1    RPM 60    Watts 15    Minutes 15    METs 1.1      T5 Nustep   Level 1    SPM 80    Minutes 15    METs 1.1      Biostep-RELP   Level 1    SPM 50    Minutes 15    METs 1.1      Track   Laps 17    Minutes 15    METs 1.92      Prescription Details   Frequency (times per week) 2    Duration Progress to 30 minutes of continuous aerobic without signs/symptoms of physical distress      Intensity   THRR 40-80% of Max Heartrate 96 - 126    Ratings of Perceived Exertion 11-13    Perceived Dyspnea 0-4      Progression   Progression Continue to progress workloads to maintain intensity without signs/symptoms of physical distress.      Resistance Training   Training Prescription Yes    Weight 4 lb     Reps 10-15             Perform Capillary Blood Glucose checks as needed.  Exercise Prescription Changes:   Exercise Prescription Changes     Row Name 05/26/22 1600 06/14/22 1400 06/27/22 1200 07/11/22 1500 07/12/22 0900     Response to Exercise   Blood Pressure (Admit) 140/76 134/68 132/74 130/68 --   Blood Pressure (Exercise) 148/74 152/72 126/64 132/70 --   Blood Pressure (Exit) 134/72 130/64 100/64 108/62 --   Heart Rate (Admit) 66 bpm 68 bpm 74 bpm 74 bpm --   Heart Rate (Exercise) 94 bpm 96 bpm 97 bpm 101 bpm --   Heart Rate (Exit) 73 bpm 90 bpm 86 bpm 84 bpm --   Oxygen Saturation (Admit) 93 % 93 % 94 % 92 % --   Oxygen Saturation (Exercise) 86 % 88 % 88 % 88 % --   Oxygen Saturation (Exit) 93 % 91 % 93 % 94 % --   Rating of Perceived Exertion (Exercise) 13 15 13 13  --   Perceived Dyspnea (Exercise) 3 3 2 2  --   Symptoms Back pain 9/10, SOB none SOB, back pain SOB --   Comments walk test results First two full days of exercise -- -- --   Duration -- Progress to 30 minutes of  aerobic without signs/symptoms of physical distress Continue with 30 min of aerobic exercise without signs/symptoms of physical distress. Continue with 30 min of aerobic exercise without signs/symptoms of physical distress. --   Intensity -- THRR unchanged THRR unchanged THRR unchanged --     Progression   Progression -- Continue to progress workloads to maintain intensity without signs/symptoms of physical distress. Continue to progress workloads to maintain intensity without signs/symptoms of physical distress. Continue to progress workloads to maintain intensity without signs/symptoms of physical distress. --   Average METs -- 1.94 2.04 2.11 --     Resistance Training   Training Prescription -- Yes Yes Yes --   Weight -- 4 lb 4 lb 6 lb --   Reps -- 10-15 10-15 10-15 --     Interval Training  Interval Training -- No No No --     Oxygen   Oxygen -- Continuous Continuous -- --      Recumbant Bike   Level -- 1 4 2  --   Watts -- 15 20 19  --   Minutes -- 15 15 15  --   METs -- -- 1.87 2.49 --     NuStep   Level -- -- -- 3 --   Minutes -- -- -- 15 --   METs -- -- -- 2 --     T5 Nustep   Level -- 2 3 2  --   Minutes -- 15 15 15  --   METs -- 1.8 2 1.9 --     Biostep-RELP   Level -- 2 2 -- --   Minutes -- 15 15 -- --   METs -- 2 2 -- --     Track   Laps -- 19 22 19  --   Minutes -- 15 15 15  --   METs -- 2.03 2.2 2.03 --     Home Exercise Plan   Plans to continue exercise at -- -- -- -- [employer] (comment)  [employer] and walking   Frequency -- -- -- -- Add 3 additional days to program exercise sessions.   Initial Home Exercises Provided -- -- -- -- 07/12/22     Oxygen   Maintain Oxygen Saturation -- 88% or higher 88% or higher 88% or higher --    Row Name 07/25/22 1100 08/11/22 1200 08/22/22 1600 09/05/22 1400       Response to Exercise   Blood Pressure (Admit) 132/64 116/62 122/68 120/70    Blood Pressure (Exit) 130/62 110/60 112/58 108/62    Heart Rate (Admit) 69 bpm 79 bpm 65 bpm 64 bpm    Heart Rate (Exercise) 101 bpm 100 bpm 98 bpm 94 bpm    Heart Rate (Exit) 78 bpm 91 bpm 79 bpm 70 bpm    Oxygen Saturation (Admit) 93 % 92 % 92 % 92 %    Oxygen Saturation (Exercise) 89 % 85 % 88 % 88 %    Oxygen Saturation (Exit) 92 % 92 % 94 % 95 %    Rating of Perceived Exertion (Exercise) 13 13 13 13     Perceived Dyspnea (Exercise) 2 2 2 2     Symptoms SOB SOB SOB SOB    Duration Continue with 30 min of aerobic exercise without signs/symptoms of physical distress. Continue with 30 min of aerobic exercise without signs/symptoms of physical distress. Continue with 30 min of aerobic exercise without signs/symptoms of physical distress. Continue with 30 min of aerobic exercise without signs/symptoms of physical distress.    Intensity THRR unchanged THRR unchanged THRR unchanged THRR unchanged      Progression   Progression Continue to progress workloads to  maintain intensity without signs/symptoms of physical distress. Continue to progress workloads to maintain intensity without signs/symptoms of physical distress. Continue to progress workloads to maintain intensity without signs/symptoms of physical distress. Continue to progress workloads to maintain intensity without signs/symptoms of physical distress.    Average METs 2.32 2.08 1.98 2.08      Resistance Training   Training Prescription Yes Yes Yes Yes    Weight 6 lb 6 lb 6 lb 6 lb    Reps 10-15 10-15 10-15 10-15      Interval Training   Interval Training No No No No      Treadmill   MPH 1 -- -- --  Grade 0 -- -- --    Minutes 15 -- -- --    METs 1.8 -- -- --      Recumbant Bike   Level 3 3 -- --    Watts 27 22 -- --    Minutes 15 15 -- --    METs 2.71 2.57 -- --      Arm Ergometer   Level -- 1 1 2     Minutes -- 15 15 15     METs -- 1 1 2.7      T5 Nustep   Level 3 3 -- --    Minutes 15 15 -- --    METs 2 2 -- --      Biostep-RELP   Level 3 3 3 3     Minutes 15 15 15 15     METs 3 -- 2 2      Track   Laps 20 26 25 25     Minutes 15 15 15 15     METs 2.09 2.41 2.36 2.36      Home Exercise Plan   Plans to continue exercise at [employer] (comment)  [employer] and walking [employer] (comment)  [employer] and walking [employer] (comment)  [employer] and walking [employer] (comment)  [employer] and walking    Frequency Add 3 additional days to program exercise sessions. Add 3 additional days to program exercise sessions. Add 3 additional days to program exercise sessions. Add 3 additional days to program exercise sessions.    Initial Home Exercises Provided 07/12/22 07/12/22 07/12/22 07/12/22      Oxygen   Maintain Oxygen Saturation 88% or higher 88% or higher 88% or higher 88% or higher             Exercise Comments:   Exercise Goals and Review:   Exercise Goals     Row Name 05/26/22 1618             Exercise Goals   Increase Physical Activity  Yes       Intervention Provide advice, education, support and counseling about physical activity/exercise needs.;Develop an individualized exercise prescription for aerobic and resistive training based on initial evaluation findings, risk stratification, comorbidities and participant's personal goals.       Expected Outcomes Short Term: Attend rehab on a regular basis to increase amount of physical activity.;Long Term: Add in home exercise to make exercise part of routine and to increase amount of physical activity.;Long Term: Exercising regularly at least 3-5 days a week.       Increase Strength and Stamina Yes       Intervention Provide advice, education, support and counseling about physical activity/exercise needs.;Develop an individualized exercise prescription for aerobic and resistive training based on initial evaluation findings, risk stratification, comorbidities and participant's personal goals.       Expected Outcomes Short Term: Increase workloads from initial exercise prescription for resistance, speed, and METs.;Short Term: Perform resistance training exercises routinely during rehab and add in resistance training at home;Long Term: Improve cardiorespiratory fitness, muscular endurance and strength as measured by increased METs and functional capacity ( )       Able to understand and use rate of perceived exertion (RPE) scale Yes       Intervention Provide education and explanation on how to use RPE scale       Expected Outcomes Short Term: Able to use RPE daily in rehab to express subjective intensity level;Long Term:  Able to use RPE to guide intensity level when  exercising independently       Able to understand and use Dyspnea scale Yes       Intervention Provide education and explanation on how to use Dyspnea scale       Expected Outcomes Short Term: Able to use Dyspnea scale daily in rehab to express subjective sense of shortness of breath during exertion;Long Term: Able to use  Dyspnea scale to guide intensity level when exercising independently       Knowledge and understanding of Target Heart Rate Range (THRR) Yes       Intervention Provide education and explanation of THRR including how the numbers were predicted and where they are located for reference       Expected Outcomes Short Term: Able to state/look up THRR;Short Term: Able to use daily as guideline for intensity in rehab;Long Term: Able to use THRR to govern intensity when exercising independently       Able to check pulse independently Yes       Intervention Provide education and demonstration on how to check pulse in carotid and radial arteries.;Review the importance of being able to check your own pulse for safety during independent exercise       Expected Outcomes Short Term: Able to explain why pulse checking is important during independent exercise;Long Term: Able to check pulse independently and accurately       Understanding of Exercise Prescription Yes       Intervention Provide education, explanation, and written materials on patient's individual exercise prescription       Expected Outcomes Short Term: Able to explain program exercise prescription;Long Term: Able to explain home exercise prescription to exercise independently                Exercise Goals Re-Evaluation :  Exercise Goals Re-Evaluation     Row Name 06/14/22 1505 06/16/22 1002 06/27/22 1213 07/11/22 1523 07/12/22 0952     Exercise Goal Re-Evaluation   Exercise Goals Review Increase Physical Activity;Increase Strength and Stamina;Understanding of Exercise Prescription Increase Physical Activity;Increase Strength and Stamina;Understanding of Exercise Prescription Increase Physical Activity;Increase Strength and Stamina;Understanding of Exercise Prescription Increase Physical Activity;Increase Strength and Stamina;Understanding of Exercise Prescription Increase Physical Activity;Increase Strength and Stamina;Understanding of Exercise  Prescription   Comments Towan is off to a good start in the program. He had an average MET level of 1.94 METs during his first two sessions in the program. He also was able to work at level 2 on the biostep and T4 Nustep. He walked up to 19 laps on the track as well. We will continue to monitor his progress in the program. Granite states that he feels he is doing well in the program. He states that he is able to move a lot better since starting the program. This is the patients second time in the program and he states that he knows the program is beneficial, so he looks forward to the long term benefits of staying consistent with his exercise in the program. Patient states that he has been doing some resistance training with hand weights at home on his days away from rehab. He was encouraged to begin walking some on his days away from rehab. He stated that he and his wife plan to join the [facility] to begin exercising together. We will continue to monitor his progress in the program. Imari continues to do well in rehab. He increased to level 3 on the T5 Nustep and up to level 4 on the recumbent bike. He is  limited with his walking due to chronic back pain, however, he was still able to walk 22 laps on the track, which is his highest yet.  Oxygen saturations are staying 88% or higher. We will continue to monitor. Rock is doing well in rehab. He has continued to work at level 2 on the T5 nustep and recumbent bike, and level 3 on the T4 nustep. He also has continued to walk between 19-22 laps on the track as well. He also increased to 6 lb hand weights for resistance training. We will continue to monitor his progress in the program. Hector is doing well in rehab.  He is walking some on his off days when it is warmer out.  They are planning to join [employer] with [employer].  He said his breathing does better inside than outside especially in the spring.  He is getting more stamina, but wants to lose weight.  Reviewed home exercise with pt today.  Pt plans to walk and join [facility] for exercise.  Reviewed THR, pulse, RPE, sign and symptoms, pulse oximetery and when to call 911 or MD.  Also discussed weather considerations and indoor options.  Pt voiced understanding.   Expected Outcomes Short: Continue to follow current exercise prescription. Long: Continue to improve strength and stamina. Short: Begin going to the [employer] to exercise on days away from rehab. Long: Continue to improve strength and stamina. Short: Continue to increase laps on track as tolerated with back Long: Continue to increase overall MET level and stamina Short: Continue to increase laps on track. Long: Continue to improve strength and stamina. Short: Join [employer]  Long: Exercise on off days consistently    Row Name 07/25/22 1158 07/26/22 1022 08/11/22 1203 08/22/22 1616 08/23/22 1022     Exercise Goal Re-Evaluation   Exercise Goals Review Increase Physical Activity;Increase Strength and Stamina;Understanding of Exercise Prescription Increase Physical Activity;Increase Strength and Stamina;Understanding of Exercise Prescription Increase Physical Activity;Increase Strength and Stamina;Understanding of Exercise Prescription Increase Physical Activity;Increase Strength and Stamina;Understanding of Exercise Prescription Increase Physical Activity;Increase Strength and Stamina;Understanding of Exercise Prescription   Comments Tallie continues to do well in rehab. He did increase to 20 laps on the track, which is his highest yet. He also increased to level 3 on the T5 Nustep as well as Biostep. On the recumbent bike, he worked up to 27 watts. His osyxgen levels are staying above 88%. We will continue to monitor. Ivie is doing well in rehab.  He is good about doing his hand weights at home.  His wife makes him get up and go for walks.  She will also take him when she goes to store so that he can walk while she does her shopping.  He does feel like his  stamina is getting better. Sahwn is doing well in rehab. He recently increased his laps on the track, walking up to 26 laps on the track. He also has stayed consistent with his workloads on seated machines at level 3 on the biostep, T5 nustep, and recumbent bike. He also has continued to tolerate 6 lb hand weights for resistance training. We will continue to monitor his progress. Ranvir continues to do well in rehab. He has been consistent at 20-25 laps on the track, he will take breaks as needed with his oxygen levels. He has been at level 1 on the arm crank and could benefit from increasing that workload. Will continue to monitor. Ahadu is doing well in rehab.  He is walking and  using equipment and weights at home.  He is feeling a little better and stronger than when he started.  He is still planning to join [employer] with his wife.   Expected Outcomes Short: Continue to increase laps on track Long: Continue to increase overall MET level and stamina Short: Continue to walk on his off days Long: Continue to improve stamina Short: Continue to progressively increase workloads on seated machines. Long: Continue to improve strength and stamina. Short: Increase level on arm crank Long: Continue to increase overall MET level and stamina SHort: [employer] before graduating Long: Continue to exercise indpendently    Row Name 09/05/22 1503             Exercise Goal Re-Evaluation   Exercise Goals Review Increase Physical Activity;Increase Strength and Stamina;Understanding of Exercise Prescription       Comments Jeran is doing well in rehab. He has continued to work at an average overall MET level above 2 METs. He also was able to improve to level 2 on the arm crank. He has consistently walked up to 25 laps on the track as well. We will continue to monitor his progress in the program.       Expected Outcomes Short: Continue to push for more laps on the track. Long: Continue to improve strength and stamina.                 Discharge Exercise Prescription (Final Exercise Prescription Changes):  Exercise Prescription Changes - 09/05/22 1400       Response to Exercise   Blood Pressure (Admit) 120/70    Blood Pressure (Exit) 108/62    Heart Rate (Admit) 64 bpm    Heart Rate (Exercise) 94 bpm    Heart Rate (Exit) 70 bpm    Oxygen Saturation (Admit) 92 %    Oxygen Saturation (Exercise) 88 %    Oxygen Saturation (Exit) 95 %    Rating of Perceived Exertion (Exercise) 13    Perceived Dyspnea (Exercise) 2    Symptoms SOB    Duration Continue with 30 min of aerobic exercise without signs/symptoms of physical distress.    Intensity THRR unchanged      Progression   Progression Continue to progress workloads to maintain intensity without signs/symptoms of physical distress.    Average METs 2.08      Resistance Training   Training Prescription Yes    Weight 6 lb    Reps 10-15      Interval Training   Interval Training No      Arm Ergometer   Level 2    Minutes 15    METs 2.7      Biostep-RELP   Level 3    Minutes 15    METs 2      Track   Laps 25    Minutes 15    METs 2.36      Home Exercise Plan   Plans to continue exercise at [employer] (comment)   [employer] and walking   Frequency Add 3 additional days to program exercise sessions.    Initial Home Exercises Provided 07/12/22      Oxygen   Maintain Oxygen Saturation 88% or higher             Nutrition:  Target Goals: Understanding of nutrition guidelines, daily intake of sodium 1500mg , cholesterol 200mg , calories 30% from fat and 7% or less from saturated fats, daily to have 5 or more servings of fruits and vegetables.  Education: All About Nutrition: -Group instruction provided by verbal, written material, interactive activities, discussions, models, and posters to present general guidelines for heart healthy nutrition including fat, fiber, MyPlate, the role of sodium in heart healthy nutrition, utilization  of the nutrition label, and utilization of this knowledge for meal planning. Follow up email sent as well. Written material given at graduation. Flowsheet Row Pulmonary Rehab from 10/08/2020 in [facility] Cardiac and Pulmonary Rehab  Date 09/10/20  Educator [facility]  Instruction Review Code 1- Verbalizes Understanding       Biometrics:  Pre Biometrics - 05/26/22 1553       Pre Biometrics   Height 5\' 11"  (1.803 m)    Weight 270 lb 8 oz (122.7 kg)    Waist Circumference 55 inches    Hip Circumference 48.5 inches    Waist to Hip Ratio 1.13 %    BMI (Calculated) 37.74    Single Leg Stand 3.2 seconds              Nutrition Therapy Plan and Nutrition Goals:  Nutrition Therapy & Goals - 05/26/22 1547       Nutrition Therapy   RD appointment deferred Yes   Deferred     Intervention Plan   Intervention Prescribe, educate and counsel regarding individualized specific dietary modifications aiming towards targeted core components such as weight, hypertension, lipid management, diabetes, heart failure and other comorbidities.    Expected Outcomes Short Term Goal: Understand basic principles of dietary content, such as calories, fat, sodium, cholesterol and nutrients.;Short Term Goal: A plan has been developed with personal nutrition goals set during dietitian appointment.;Long Term Goal: Adherence to prescribed nutrition plan.             Nutrition Assessments:  MEDIFICTS Score Key: ?70 Need to make dietary changes  40-70 Heart Healthy Diet ? 40 Therapeutic Level Cholesterol Diet  Flowsheet Row Pulmonary Rehab from 05/26/2022 in [facility] Cardiac and Pulmonary Rehab  Picture Your Plate Total Score on Admission 59      Picture Your Plate Scores: <16 Unhealthy dietary pattern with much room for improvement. 41-50 Dietary pattern unlikely to meet recommendations for good health and room for improvement. 51-60 More healthful dietary pattern, with some room for improvement.  >60 Healthy  dietary pattern, although there may be some specific behaviors that could be improved.   Nutrition Goals Re-Evaluation:  Nutrition Goals Re-Evaluation     Row Name 06/16/22 1014 07/12/22 0958 07/26/22 1027 08/23/22 1024       Goals   Nutrition Goal -- Continue to work on healthy eating. Short: Work on portion control Long: Continue to eat heart healthy Short: Continue to work on portion control Long: conitnue to work on [employer]    Comment Jhace denies wanting to meet with the RD at this time. However, he states that his wife has helped him work on healthy eating. They attended the nutrition education classes the last time he was in the program and have implemented the things that they learned. Kymoni continues to [employer].  His wife makes sure that he eats good but he admits to over eating on occasion and is trying to cut back.  His wife usually cooks at home and tries to get a good variety.  He needs to work on portion control. Hoai is doing well in rehab.  He is watching his diet and his wife stays on him about it.  He has backed off of added sugars and sweets.  He  would like to lose weight.  He is still working on portion sizes and his wife has really cut him back to help. Jamesandrew is doing well in rehab.  He continues to work on portion control.  His wife is staying on top of him and watching what he eats.  He is limiting sugars and salt still.    Expected Outcome Continue to work on healthy eating. Short: Work on portion control Long: Continue to eat heart healthy Short: Continue to work on portion control Long: conitnue to work on Armed forces training and education officer; Continue to work on portions Long: Conitnue to stay healthy             Nutrition Goals Discharge (Final Nutrition Goals Re-Evaluation):  Nutrition Goals Re-Evaluation - 08/23/22 1024       Goals   Nutrition Goal Short: Continue to work on portion control Long: conitnue to work on [employer]    Comment Dejaun is  doing well in rehab.  He continues to work on portion control.  His wife is staying on top of him and watching what he eats.  He is limiting sugars and salt still.    Expected Outcome Short; Continue to work on portions Long: Conitnue to stay healthy             Psychosocial: Target Goals: Acknowledge presence or absence of significant depression and/or stress, maximize coping skills, provide positive support system. Participant is able to verbalize types and ability to use techniques and skills needed for reducing stress and depression.   Education: Stress, Anxiety, and Depression - Group verbal and visual presentation to define topics covered.  Reviews how body is impacted by stress, anxiety, and depression.  Also discusses healthy ways to reduce stress and to treat/manage anxiety and depression.  Written material given at graduation. Flowsheet Row Pulmonary Rehab from 10/08/2020 in [facility] Cardiac and Pulmonary Rehab  Date 10/08/20  Educator [facility]  Instruction Review Code 1- [employer] Understanding       Education: Sleep Hygiene -Provides group verbal and written instruction about how sleep can affect your health.  Define sleep hygiene, discuss sleep cycles and impact of sleep habits. Review good sleep hygiene tips.    Initial Review & Psychosocial Screening:  Initial Psych Review & Screening - 05/17/22 1410       Initial Review   Current issues with Current Stress Concerns    Source of Stress Concerns Unable to participate in former interests or hobbies;Unable to perform yard/household activities;Chronic Illness      Family Dynamics   Good Support System? Yes   wife     Barriers   Psychosocial barriers to participate in program There are no identifiable barriers or psychosocial needs.;The patient should benefit from training in stress management and relaxation.      Screening Interventions   Interventions Encouraged to exercise;Provide feedback about the scores to participant;To  provide support and resources with identified psychosocial needs    Expected Outcomes Short Term goal: Utilizing psychosocial counselor, staff and physician to assist with identification of specific Stressors or current issues interfering with healing process. Setting desired goal for each stressor or current issue identified.;Long Term Goal: Stressors or current issues are controlled or eliminated.;Short Term goal: Identification and review with participant of any Quality of Life or Depression concerns found by scoring the questionnaire.;Long Term goal: The participant improves quality of Life and PHQ9 Scores as seen by post scores and/or verbalization of changes  Quality of Life Scores:  Scores of 19 and below usually indicate a poorer quality of life in these areas.  A difference of  2-3 points is a clinically meaningful difference.  A difference of 2-3 points in the total score of the Quality of Life Index has been associated with significant improvement in overall quality of life, self-image, physical symptoms, and general health in studies assessing change in quality of life.  PHQ-9: Review Flowsheet  More data exists      05/26/2022 01/26/2021 08/31/2020 10/20/2015 10/16/2015  Depression screen PHQ 2/9  Decreased Interest 1 1 1  0 0  Down, Depressed, Hopeless 0 0 0 0 0  PHQ - 2 Score 1 1 1  0 0  Altered sleeping 0 1 0 - -  Tired, decreased energy 3 2 1  - -  Change in appetite 0 2 0 - -  Feeling bad or failure about yourself  0 0 0 - -  Trouble concentrating 0 0 0 - -  Moving slowly or fidgety/restless 0 0 0 - -  Suicidal thoughts 0 0 0 - -  PHQ-9 Score 4 6 2  - -  Difficult doing work/chores Not difficult at all Somewhat difficult Not difficult at all - -   Interpretation of Total Score  Total Score Depression Severity:  1-4 = Minimal depression, 5-9 = Mild depression, 10-14 = Moderate depression, 15-19 = Moderately severe depression, 20-27 = Severe depression    Psychosocial Evaluation and Intervention:  Psychosocial Evaluation - 05/17/22 1416       Psychosocial Evaluation & Interventions   Interventions Encouraged to exercise with the program and follow exercise prescription    Comments Amaurion 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 He has done the program before and is familiar with what to expect. 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 His wife is his main support system and is very involved in his health care. She states that his doctor wants him to lose weight and thinks that will help with his breathing and afib. She is concerned about his stamina. He mentioned she probably knows more about his health than he does. He does want to increase his stamina and feel stronger while improving his breathing. His health journey has been rough these last few years and he wants to focus on feeling better.    Expected Outcomes Short: attend pulmonary rehab for education and exercise. Long: Develop and maintain positive self care habits.    Continue Psychosocial Services  Follow up required by staff             Psychosocial Re-Evaluation:  Psychosocial Re-Evaluation     Row Name 06/16/22 1010 07/12/22 0956 07/26/22 1025 08/23/22 1025       Psychosocial Re-Evaluation   Current issues with Current Stress Concerns Current Stress Concerns Current Stress Concerns Current Stress Concerns    Comments Abbott is doing well mentally. He denies any depression or anxiety at this time. He does state that he experiences some stress due to his health concerns. He does not like being unable to do the things he wants to do. He reports sleeping well at this time. He reports having a good support system at home made up by his whole family. He does state that coming to rehab has helped his mental state because he knows he is working towards getting better. Mikaeel i s doing well in rehab.  He denies any major stressors or  symptoms of depression.  His health is his biggest stress.  He has also gotten frustrated over his hearing aids not working right and trying to get back into [place] to be seen.  He continues to have a good support system at home. He sleeps well and says sometimes he sleeps too much when he naps in his chair. Konnor is doing well in rehab.  His hearing is still one of his biggest issues. His hearing aids were unprogrammed and went to [place] yesterday and hasn't really noticed much difference.  His wife is his biggest supporter.  He tries not to let anything get to him too much.  He continues to sleep well. Legend is doing well in [place]. He is still good mentally and happy.  His hearing and breathing continue to be his biggest limitations.  He continues to sleep well.    Expected Outcomes Short: Continue to exercise for mental boost. Long: Maintain positive outlook. Short: Exercise more for mental boost Long: Continue to stay positive Short: Continue to get used to hearing aids Long: conitnue to stay positive Short: Conitnue to work on breathing Long: Continue to stay positive    Interventions Encouraged to attend Pulmonary Rehabilitation for the exercise Encouraged to attend Pulmonary Rehabilitation for the exercise Encouraged to attend Pulmonary Rehabilitation for the exercise --    Continue Psychosocial Services  Follow up required by staff Follow up required by staff Follow up required by staff --      Initial Review   Source of Stress Concerns Unable to participate in former interests or hobbies;Unable to perform yard/household activities;Chronic Illness -- -- --             Psychosocial Discharge (Final Psychosocial Re-Evaluation):  Psychosocial Re-Evaluation - 08/23/22 1025       Psychosocial Re-Evaluation   Current issues with Current Stress Concerns    Comments Jenesis is doing well in [place]. He is still good mentally and happy.  His hearing and breathing continue to be his biggest limitations.   He continues to sleep well.    Expected Outcomes Short: Conitnue to work on breathing Long: Continue to stay positive             Education: Education Goals: Education classes will be provided on a weekly basis, covering required topics. Participant will state understanding/return demonstration of topics presented.  Learning Barriers/Preferences:  Learning Barriers/Preferences - 05/17/22 1408       Learning Barriers/Preferences   Learning Barriers Hearing    Learning Preferences None             General Pulmonary Education Topics:  Infection Prevention: - Provides verbal and written material to individual with discussion of infection control including proper hand washing and proper equipment cleaning during exercise session. Flowsheet Row Pulmonary Rehab from 09/01/2022 in [facility] Cardiac and Pulmonary Rehab  Education need identified 05/26/22  Date 05/26/22  Educator KW  Instruction Review Code 1- Verbalizes Understanding       Falls Prevention: - Provides verbal and written material to individual with discussion of falls prevention and safety. Flowsheet Row Pulmonary Rehab from 09/01/2022 in [facility] Cardiac and Pulmonary Rehab  Education need identified 05/26/22  Date 05/26/22  Educator KW  Instruction Review Code 1- Verbalizes Understanding       Chronic Lung Disease Review: - Group verbal instruction with posters, models, PowerPoint presentations and videos,  to review new updates, new respiratory medications, new advancements in procedures and treatments. Providing information on websites and "800" numbers for continued self-education. Includes  information about supplement oxygen, available portable oxygen systems, continuous and intermittent flow rates, oxygen safety, concentrators, and Medicare reimbursement for oxygen. Explanation of Pulmonary Drugs, including class, frequency, complications, importance of spacers, rinsing mouth after steroid MDI's, and proper  cleaning methods for nebulizers. Review of basic lung anatomy and physiology related to function, structure, and complications of lung disease. Review of risk factors. Discussion about methods for diagnosing sleep apnea and types of masks and machines for OSA. Includes a review of the use of types of environmental controls: home humidity, furnaces, filters, dust mite/pet prevention, HEPA vacuums. Discussion about weather changes, air quality and the benefits of nasal washing. Instruction on Warning signs, infection symptoms, calling MD promptly, preventive modes, and value of vaccinations. Review of effective airway clearance, coughing and/or vibration techniques. Emphasizing that all should Create an Action Plan. Written material given at graduation. Flowsheet Row Pulmonary Rehab from 09/01/2022 in [facility] Cardiac and Pulmonary Rehab  Education need identified 05/26/22       AED/CPR: - Group verbal and written instruction with the use of models to demonstrate the basic use of the AED with the basic ABC's of resuscitation.    Anatomy and Cardiac Procedures: - Group verbal and visual presentation and models provide information about basic cardiac anatomy and function. Reviews the testing methods done to diagnose heart disease and the outcomes of the test results. Describes the treatment choices: Medical Management, Angioplasty, or Coronary Bypass Surgery for treating various heart conditions including Myocardial Infarction, Angina, Valve Disease, and Cardiac Arrhythmias.  Written material given at graduation.   Medication Safety: - Group verbal and visual instruction to review commonly prescribed medications for heart and lung disease. Reviews the medication, class of the drug, and side effects. Includes the steps to properly store meds and maintain the prescription regimen.  Written material given at graduation. Flowsheet Row Pulmonary Rehab from 10/08/2020 in [facility] Cardiac and Pulmonary Rehab  Date  09/17/20  Educator SB  Instruction Review Code 1- Verbalizes Understanding       Other: -Provides group and verbal instruction on various topics (see comments)   Knowledge Questionnaire Score:  Knowledge Questionnaire Score - 05/26/22 1536       Knowledge Questionnaire Score   Pre Score 17/18              Core Components/Risk Factors/Patient Goals at Admission:  Personal Goals and Risk Factors at Admission - 05/26/22 1618       Core Components/Risk Factors/Patient Goals on Admission    Weight Management Yes;Weight Loss;Obesity    Intervention Weight Management: Develop a combined nutrition and exercise program designed to reach desired caloric intake, while maintaining appropriate intake of nutrient and fiber, sodium and fats, and appropriate energy expenditure required for the weight goal.;Weight Management: Provide education and appropriate resources to help participant work on and attain dietary goals.;Weight Management/Obesity: Establish reasonable short term and long term weight goals.;Obesity: Provide education and appropriate resources to help participant work on and attain dietary goals.    Admit Weight 270 lb (122.5 kg)    Goal Weight: Short Term 265 lb (120.2 kg)    Goal Weight: Long Term 230 lb (104.3 kg)    Expected Outcomes Short Term: Continue to assess and modify interventions until short term weight is achieved;Long Term: Adherence to nutrition and physical activity/exercise program aimed toward attainment of established weight goal;Weight Loss: Understanding of general recommendations for a balanced deficit meal plan, which promotes 1-2 lb weight loss per week and includes a negative energy  balance of [phone] kcal/d;Understanding recommendations for meals to include 15-35% energy as protein, 25-35% energy from fat, 35-60% energy from carbohydrates, less than 200mg  of dietary cholesterol, 20-35 gm of total fiber daily;Understanding of distribution of calorie intake  throughout the day with the consumption of 4-5 meals/snacks    Improve shortness of breath with ADL's Yes    Intervention Provide education, individualized exercise plan and daily activity instruction to help decrease symptoms of SOB with activities of daily living.    Expected Outcomes Short Term: Improve cardiorespiratory fitness to achieve a reduction of symptoms when performing ADLs;Long Term: Be able to perform more ADLs without symptoms or delay the onset of symptoms    Diabetes Yes    Intervention Provide education about signs/symptoms and action to take for hypo/hyperglycemia.;Provide education about proper nutrition, including hydration, and aerobic/resistive exercise prescription along with prescribed medications to achieve blood glucose in normal ranges: Fasting glucose 65-99 mg/dL    Expected Outcomes Short Term: Participant verbalizes understanding of the signs/symptoms and immediate care of hyper/hypoglycemia, proper foot care and importance of medication, aerobic/resistive exercise and nutrition plan for blood glucose control.;Long Term: Attainment of HbA1C < 7%.    Hypertension Yes    Intervention Provide education on lifestyle modifcations including regular physical activity/exercise, weight management, moderate sodium restriction and increased consumption of fresh fruit, vegetables, and low fat dairy, alcohol moderation, and smoking cessation.;Monitor prescription use compliance.    Expected Outcomes Short Term: Continued assessment and intervention until BP is < 140/66mm HG in hypertensive participants. < 130/43mm HG in hypertensive participants with diabetes, heart failure or chronic kidney disease.;Long Term: Maintenance of blood pressure at goal levels.             Education:Diabetes - Individual verbal and written instruction to review signs/symptoms of diabetes, desired ranges of glucose level fasting, after meals and with exercise. Acknowledge that pre and post exercise  glucose checks will be done for 3 sessions at entry of program. Flowsheet Row Pulmonary Rehab from 05/17/2022 in [facility] Cardiac and Pulmonary Rehab  Date 05/17/22  Educator [facility]  Instruction Review Code 1- Verbalizes Understanding       Know Your Numbers and Heart Failure: - Group verbal and visual instruction to discuss disease risk factors for cardiac and pulmonary disease and treatment options.  Reviews associated critical values for Overweight/Obesity, Hypertension, Cholesterol, and Diabetes.  Discusses basics of heart failure: signs/symptoms and treatments.  Introduces Heart Failure Zone chart for action plan for heart failure.  Written material given at graduation. Flowsheet Row Pulmonary Rehab from 10/08/2020 in [facility] Cardiac and Pulmonary Rehab  Date 09/24/20  Educator SB  Instruction Review Code 1- Verbalizes Understanding       Core Components/Risk Factors/Patient Goals Review:   Goals and Risk Factor Review     Row Name 06/16/22 1016 07/12/22 1001 07/26/22 1029 08/23/22 1022       Core Components/Risk Factors/Patient Goals Review   Personal Goals Review Weight Management/Obesity;Diabetes;Hypertension Weight Management/Obesity;Diabetes;Hypertension;Improve shortness of breath with ADL's;Increase knowledge of respiratory medications and ability to use respiratory devices properly. Weight Management/Obesity;Diabetes;Hypertension;Improve shortness of breath with ADL's;Increase knowledge of respiratory medications and ability to use respiratory devices properly. Weight Management/Obesity;Diabetes;Hypertension;Improve shortness of breath with ADL's;Increase knowledge of respiratory medications and ability to use respiratory devices properly.    Review Juel states that he would like to lose a little weight. He is currently at 267.1 lbs but would like to get down to around 230 lbs. Rustin has been checking his blood sugars at  home around once a week and states that they have been within  normal ranges. He also reports checking his BP at home and states that it has been within normal ranges as well. Karma's weight has started to creep back up again.  He wants to be more active to help with weight loss but he gets SOB quickly from his afib.  They wants him to try a new medication but it would require him to be in hospital to start and if he misses a dose he would be back in hospital.  He does not want that kind of stress and is trying to learn to live with it.  He is doing well with his inhaler and medications.  He does have oxygen to use at home as needed, but he has not needed it very often.  His pressures have been good and blood sugars have been good too. Mykal continues to work on his weight loss.  He is doing better with his diet but he now knows that he needs to move more.  He and his wife are planning to join [facility] after graduation to keep exercising.  His breathing is getting better, but he still gets SOB, which is his biggest limitations.  His sugars are doing well as are pressures.  He continues to use his inhaler  and seems to work well for him. He is also using his nebulizer when he has bad breathing days.  He does have oxygen for prn when he gets really SOB. Shae is doing well in rehab. He is frustrated that he is not losing weight.  His breathing is getting better and his pressures are doing well.  He still has SOB but doing well with manageing it.  His inhalers are doing well. His sugars are doing well.    Expected Outcomes Short: Continue to work towards weight goal. Long: Continue to monitor lifestyle risk factors. Short: Conitnue to work on weight loss by moving more Long: Continue to monitor risk factors Short: conitnue to work on moving more to help with weight loss Long: Continue to monitor risk factors SHort; Conitnue to work on weight loss with portion control Long: Conitnue to monitor risk factors             Core Components/Risk Factors/Patient Goals at  Discharge (Final Review):   Goals and Risk Factor Review - 08/23/22 1022       Core Components/Risk Factors/Patient Goals Review   Personal Goals Review Weight Management/Obesity;Diabetes;Hypertension;Improve shortness of breath with ADL's;Increase knowledge of respiratory medications and ability to use respiratory devices properly.    Review Leary is doing well in rehab. He is frustrated that he is not losing weight.  His breathing is getting better and his pressures are doing well.  He still has SOB but doing well with manageing it.  His inhalers are doing well. His sugars are doing well.    Expected Outcomes SHort; Conitnue to work on weight loss with portion control Long: Conitnue to monitor risk factors             ITP Comments:  ITP Comments     Row Name 05/17/22 1417 05/26/22 1611 06/22/22 1253 07/20/22 0821 08/17/22 0909   ITP Comments Initial phone call completed. Diagnosis can be found in [facility] 1/11. EP Orientation scheduled for Thursday 1/18 at 2pm. Completed and gym orientation. Initial ITP created and sent for review to Dr. Vida Rigger, , Medical Director. 30 day review completed. ITP sent to Dr.  Jinny Sanders, Medical Director of  Pulmonary Rehab. Continue with ITP unless changes are made by physician. 30 Day review completed. Medical Director ITP review done, changes made as directed, and signed approval by Medical Director. 30 day review completed. ITP sent to Dr. Jinny Sanders, Medical Director of  Pulmonary Rehab. Continue with ITP unless changes are made by physician.    Row Name 09/14/22 0833           ITP Comments 30 Day review completed. Medical Director ITP review done, changes made as directed, and signed approval by Medical Director.                Comments:

## 2022-09-15 ENCOUNTER — Encounter: Payer: Medicare Other | Admitting: *Deleted

## 2022-09-15 DIAGNOSIS — J439 Emphysema, unspecified: Secondary | ICD-10-CM | POA: Diagnosis not present

## 2022-09-15 NOTE — Progress Notes (Signed)
Daily Session Note  Patient Details  Name: Levi [name] MRN: [id number] Date of Birth: 10-02-1940 Referring Provider:   Doristine Devoid Pulmonary Rehab from 05/26/2022 in [facility] Cardiac and Pulmonary Rehab  Referring Provider [name] MD       Encounter Date: 09/15/2022  Check In:  Session Check In - 09/15/22 1037       Check-In   Supervising physician immediately available to respond to emergencies See telemetry face sheet for immediately available ER MD    Location [facility]-Cardiac & Pulmonary Rehab    Staff Present Lanny Hurst, RN, ADN;Jessica Juanetta Gosling, MA, RCEP, CCRP, Zackery Barefoot, MS, ACSM CEP, Exercise Physiologist    Virtual Visit No    Medication changes reported     No    Fall or balance concerns reported    No    Warm-up and Cool-down Performed on first and last piece of equipment    Resistance Training Performed Yes    VAD Patient? No    PAD/SET Patient? No      Pain Assessment   Currently in Pain? No/denies                Social History   Tobacco Use  Smoking Status Former   Packs/day: 0.25   Years: 62.00   Additional pack years: 0.00   Total pack years: 15.50   Types: Cigarettes   Quit date: 09/05/2018   Years since quitting: 4.0  Smokeless Tobacco Never    Goals Met:  Independence with exercise equipment Exercise tolerated well No report of concerns or symptoms today Strength training completed today  Goals Unmet:  Not Applicable  Comments: Pt able to follow exercise prescription today without complaint.  Will continue to monitor for progression.    Dr. Bethann Punches is Medical Director for [facility] Cardiac Rehabilitation.  Dr. Vida Rigger is Medical Director for [facility] Pulmonary Rehabilitation.

## 2022-09-16 ENCOUNTER — Ambulatory Visit (INDEPENDENT_AMBULATORY_CARE_PROVIDER_SITE_OTHER)
Payer: No Typology Code available for payment source | Admitting: Student in an Organized Health Care Education/Training Program

## 2022-09-16 ENCOUNTER — Encounter: Payer: Self-pay | Admitting: Student in an Organized Health Care Education/Training Program

## 2022-09-16 VITALS — BP 112/60 | HR 70 | Temp 97.8°F | Ht 71.0 in | Wt 270.0 lb

## 2022-09-16 DIAGNOSIS — J439 Emphysema, unspecified: Secondary | ICD-10-CM | POA: Diagnosis not present

## 2022-09-16 NOTE — Progress Notes (Signed)
Assessment & Plan:   1. Pulmonary emphysema, unspecified emphysema type [facility])   Pleasant 82 year old male presenting with shortness of breath with a history of COPD/Emphysema (GOLD 1B). He is currently on triple therapy with Spiriva Respimat and Advair, and is tolerating it well. He is also undergoing pulmonary rehabilitation with good response. I have recommended that he continue the current regimen, and continue to follow with cardiology (and monitor his salt intake as he is doing). Finally, we discussed weight loss which he is actively working on.  -continue Advair and Spiriva -continue pulmonary rehabilitation  Return in about 6 months (around 03/19/2023).  I spent 20 minutes caring for this patient today, including preparing to see the patient, obtaining a medical history , reviewing a separately obtained history, performing a medically appropriate examination and/or evaluation, counseling and educating the patient/family/caregiver, and documenting clinical information in the electronic health record  [name], MD [facility] 09/16/2022 9:49 PM    End of visit medications:  No orders of the defined types were placed in this encounter.    Current Outpatient Medications:    albuterol (PROVENTIL HFA;VENTOLIN HFA) 108 (90 Base) MCG/ACT inhaler, Inhale 2 puffs into the lungs every 6 (six) hours as needed for wheezing or shortness of breath., Disp: 1 Inhaler, Rfl: 0   apixaban (ELIQUIS) 5 MG TABS tablet, Take 5 mg by mouth 2 (two) times daily., Disp: , Rfl:    carboxymethylcellulose (REFRESH PLUS) 0.5 % SOLN, 1 drop 4 (four) times daily as needed., Disp: , Rfl:    finasteride (PROSCAR) 5 MG tablet, Take 5 mg by mouth daily., Disp: , Rfl:    fluticasone-salmeterol (ADVAIR) 250-50 MCG/ACT AEPB, Inhale 1 puff into the lungs in the morning and at bedtime., Disp: , Rfl:    levothyroxine (SYNTHROID) 200 MCG tablet, Take 200 mcg by mouth daily before breakfast.,  Disp: , Rfl:    SEMAGLUTIDE,0.25 OR 0.5MG /DOS, [place], Inject 0.5 mg into the skin once a week., Disp: , Rfl:    tamsulosin (FLOMAX) 0.4 MG CAPS capsule, 0.8 mg daily., Disp: , Rfl:    Tiotropium Bromide Monohydrate (SPIRIVA RESPIMAT) 2.5 MCG/ACT AERS, Inhale 2 puffs into the lungs daily., Disp: 1 each, Rfl: 11   torsemide (DEMADEX) 20 MG tablet, Take 1 tablet (20 mg total) by mouth every other day., Disp: 45 tablet, Rfl: 1   vitamin B-12 (CYANOCOBALAMIN) 1000 MCG tablet, Take 1,000 mcg by mouth daily., Disp: , Rfl:    Subjective:   PATIENT ID: Levi [name] GENDER: male DOB: May 01, 1941, MRN: [id number]  Chief Complaint  Patient presents with   Follow-up    SOB with exertion and dry cough at times prod with grey to clear sputum.     HPI  Levi [name] is a pleasant 82 year old male presenting to clinic for follow up of COPD.  Patient was previously seen in clinic and we broadened his inhaler therapy to LABA/LAMA/ICS (advair and spiriva respimat). He was also referred to pulmonary rehabilitation which he's been undertaking with good success. He's been able to do more following enrolment (such as going shopping with his wife, unloading groceries, walking, etc...). He feels that his back pain is currently limiting his ability to be active. Other symptoms are stable. He has not had any exacerbations since the time he saw me last.  PFT's performed 04/22/2022 showed FEV1/FVC at 59% and FEV1 at 69% predicted, post dilator FEV1 was 84% predicted.   He denies any wheezing, chest pain,  or chest tightness. His cough is productive of whitish sputum, and he denies a change in the nature of the sputum, and denies any hemoptysis.  He continues to try and loose weight. He's been limiting his intake of carbohydrates, and continues to work on weight loss. He is less sedentary than prior. They are planning a cruise this summer.   He quit smoking around the time of his diagnosis with lung cancer. At that time,  he underwent navigational bronchoscopy with biopsies. 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 He reports some lower extremity edema, worse at the end of the day. He reports having to go up every 2 hours at night to pee. He is taking tamsulosin for BPH. He does not have any vision problems. He has not had any worsening of his BPH with the initiation of the Spiriva. He is hard of hearing. He has a history of Afib for which he is followed by cardiology. He feels that some of his symptoms are due to his afib.   He did smoke for a long time, with at least 50 to 60 pack years on him. 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 He is currently retired.  Doing well, symptoms better. Less short of breath, able to walk longer distances. Compliant with inhalers, continues with pulm rehab. Going on a cruise in September.  Ancillary information including prior medications, full medical/surgical/family/social histories, and PFTs (when available) are listed below and have been reviewed.   Review of Systems  Constitutional:  Negative for chills and fever.  HENT:  Positive for hearing loss. Negative for ear pain and tinnitus.   Respiratory:  Positive for cough and shortness of breath. Negative for hemoptysis.   Cardiovascular:  Negative for chest pain.     Objective:   Vitals:   09/16/22 1115  BP: 112/60  Pulse: 70  Temp: 97.8 F (36.6 C)  TempSrc: Temporal  SpO2: 94%  Weight: 270 lb (122.5 kg)  Height: 5\' 11"  (1.803 m)   94% on RA BMI Readings from Last 3 Encounters:  09/16/22 37.66 kg/m  08/11/22 37.85 kg/m  06/09/22 38.08 kg/m   Wt Readings from Last 3 Encounters:  09/16/22 270 lb (122.5 kg)  08/11/22 271 lb 6.4 oz (123.1 kg)  06/09/22 273 lb (123.8 kg)    Physical  Exam Constitutional:      Appearance: He is obese.  HENT:     Head: Normocephalic.     Nose: Nose normal.     Mouth/Throat:     Mouth: Mucous membranes are moist.  Eyes:     Extraocular Movements: Extraocular movements intact.  Cardiovascular:     Rate and Rhythm: Normal rate. Rhythm irregular.     Heart sounds: Normal heart sounds.  Pulmonary:     Effort: Pulmonary effort is normal.     Breath sounds: No wheezing or rales.     Comments: Distant breath sounds Abdominal:     General: There is distension.     Palpations: Abdomen is soft.  Neurological:     General: No focal deficit present.     Mental Status: He is alert and oriented to person, place, and time. Mental status is at baseline.       Ancillary Information    Past Medical  History:  Diagnosis Date   Asthma    COPD (chronic obstructive pulmonary disease) (HCC)    Hearing loss    Hypertension    Hypothyroidism    Mass of left lung    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HCC)    Prostate enlargement    Shortness of breath    Squamous cell carcinoma of lung, left (HCC)    Thyroid disease    Tobacco abuse      Family History  Problem Relation Age of Onset   Aneurysm Mother    Cancer Father      Past Surgical History:  Procedure Laterality Date   CARDIOVERSION N/A    CARDIOVERSION N/A 11/24/2021   Procedure: CARDIOVERSION;  Surgeon: Yvonne Kendall, MD;  Location: [facility] ORS;  Service: Cardiovascular;  Laterality: N/A;   ELECTROMAGNETIC NAVIGATION BROCHOSCOPY Left 10/19/2018   Procedure: ELECTROMAGNETIC NAVIGATION BRONCHOSCOPY LEFT;  Surgeon: Salena Saner, MD;  Location: [facility] ORS;  Service: Cardiopulmonary;  Laterality: Left;   ENDOBRONCHIAL ULTRASOUND Left 10/19/2018   Procedure: ENDOBRONCHIAL ULTRASOUND LEFT;  Surgeon: Salena Saner, MD;  Location: [facility] ORS;  Service: Cardiopulmonary;  Laterality: Left;   MELANOMA EXCISION Left    NO PAST SURGERIES      Social History   Socioeconomic History    Marital status: Married    Spouse name: Olegario Messier   Number of children: 5   Years of education: Not on file   Highest education level: Not on file  Occupational History   Not on file  Tobacco Use   Smoking status: Former    Packs/day: 0.25    Years: 62.00    Additional pack years: 0.00    Total pack years: 15.50    Types: Cigarettes    Quit date: 09/05/2018    Years since quitting: 4.0   Smokeless tobacco: Never  Vaping Use   Vaping Use: Never used  Substance and Sexual Activity   Alcohol use: No    Alcohol/week: 0.0 standard drinks of alcohol   Drug use: No   Sexual activity: Not on file  Other Topics Concern   Not on file  Social History Narrative   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 He is married to his wife of 30+ years, Olegario Messier and has 5 children (1 deceased), many grand children, and one great grand child.    Social Determinants of Health   Financial Resource Strain: Low Risk  (12/04/2018)   Overall Financial Resource Strain (CARDIA)    Difficulty of Paying Living Expenses: Not very hard  Food Insecurity: No Food Insecurity (12/04/2018)   Hunger Vital Sign    Worried About Running Out of Food in the Last Year: Never true    Ran Out of Food in the Last Year: Never true  Transportation Needs: No Transportation Needs (12/04/2018)   PRAPARE - Administrator, Civil Service (Medical): No    Lack of Transportation (Non-Medical): No  Physical Activity: Not on file  Stress: Stress Concern Present (12/04/2018)   [employer] of Occupational Health - Occupational Stress Questionnaire    Feeling of Stress : Very much  Social Connections: Unknown (12/04/2018)   Social Connection and Isolation Panel [NHANES]    Frequency of Communication with Friends and Family: More than three times a week    Frequency of  Social  Gatherings with Friends and Family: Once a week    Attends Religious Services: Not on file    Active Member of Clubs or Organizations: Not on file    Attends Banker Meetings: Not on file    Marital Status: Not on file  Intimate Partner Violence: Not on file     No Known Allergies   CBC    Component Value Date/Time   WBC 8.2 01/05/2022 1204   RBC 5.62 01/05/2022 1204   HGB 16.7 01/05/2022 1204   HCT 51.7 01/05/2022 1204   PLT 197 01/05/2022 1204   MCV 92.0 01/05/2022 1204   MCV 90.0 10/20/2015 1020   MCH 29.7 01/05/2022 1204   MCHC 32.3 01/05/2022 1204   RDW 14.3 01/05/2022 1204   LYMPHSABS 1.5 01/05/2022 1204   MONOABS 0.8 01/05/2022 1204   EOSABS 0.4 01/05/2022 1204   BASOSABS 0.1 01/05/2022 1204    Pulmonary Functions Testing Results:    Latest Ref Rng & Units 04/22/2022   12:02 PM  PFT Results  FVC-Pre L 3.75   FVC-Predicted Pre % 85   FVC-Post L 4.15   FVC-Predicted Post % 93   Pre FEV1/FVC % % 59   Post FEV1/FCV % % 64   FEV1-Pre L 2.20   FEV1-Predicted Pre % 69   FEV1-Post L 2.66   DLCO uncorrected ml/min/mmHg 11.02   DLCO UNC% % 42   DLVA Predicted % 47   TLC L 6.33   TLC % Predicted % 84   RV % Predicted % 78     Outpatient Medications Prior to Visit  Medication Sig Dispense Refill   albuterol (PROVENTIL HFA;VENTOLIN HFA) 108 (90 Base) MCG/ACT inhaler Inhale 2 puffs into the lungs every 6 (six) hours as needed for wheezing or shortness of breath. 1 Inhaler 0   apixaban (ELIQUIS) 5 MG TABS tablet Take 5 mg by mouth 2 (two) times daily.     carboxymethylcellulose (REFRESH PLUS) 0.5 % SOLN 1 drop 4 (four) times daily as needed.     finasteride (PROSCAR) 5 MG tablet Take 5 mg by mouth daily.     fluticasone-salmeterol (ADVAIR) 250-50 MCG/ACT AEPB Inhale 1 puff into the lungs in the morning and at bedtime.     levothyroxine (SYNTHROID) 200 MCG tablet Take 200 mcg by mouth daily before breakfast.     SEMAGLUTIDE,0.25 OR 0.5MG /DOS, [place]  Inject 0.5 mg into the skin once a week.     tamsulosin (FLOMAX) 0.4 MG CAPS capsule 0.8 mg daily.     Tiotropium Bromide Monohydrate (SPIRIVA RESPIMAT) 2.5 MCG/ACT AERS Inhale 2 puffs into the lungs daily. 1 each 11   torsemide (DEMADEX) 20 MG tablet Take 1 tablet (20 mg total) by mouth every other day. 45 tablet 1   vitamin B-12 (CYANOCOBALAMIN) 1000 MCG tablet Take 1,000 mcg by mouth daily.     No facility-administered medications prior to visit.

## 2022-09-20 ENCOUNTER — Encounter: Payer: Medicare Other | Admitting: *Deleted

## 2022-09-20 DIAGNOSIS — J439 Emphysema, unspecified: Secondary | ICD-10-CM

## 2022-09-20 NOTE — Progress Notes (Signed)
Daily Session Note  Patient Details  Name: Levi [name] MRN: [id number] Date of Birth: 1941-03-24 Referring Provider:   Dor[facility] Devoid [facility] Rehab from 05/26/2022 in [facility] Cardiac and Pulmonary Rehab  Referring Provider [name] MD       Encounter Date: 09/20/2022  Check In:  Session Check In - 09/20/22 1115       Check-In   Supervising physician immediately available to respond to emergencies See telemetry face sheet for immediately available ER MD    Location [facility]-Cardiac & Pulmonary Rehab    Staff Present Cora Collum, RN, BSN, CCRP;Jessica Shelton, MA, RCEP, CCRP, Zackery Barefoot, MS, ACSM CEP, Exercise Physiologist;Noah Tickle, BS, Exercise Physiologist    Virtual Visit No    Medication changes reported     No    Fall or balance concerns reported    No    Warm-up and Cool-down Performed on first and last piece of equipment    Resistance Training Performed Yes    VAD Patient? No    PAD/SET Patient? No      Pain Assessment   Currently in Pain? No/denies                Social History   Tobacco Use  Smoking Status Former   Packs/day: 0.25   Years: 62.00   Additional pack years: 0.00   Total pack years: 15.50   Types: Cigarettes   Quit date: 09/05/2018   Years since quitting: 4.0  Smokeless Tobacco Never    Goals Met:  Proper associated with RPD/PD & O2 Sat Independence with exercise equipment Exercise tolerated well No report of concerns or symptoms today  Goals Unmet:  Not Applicable  Comments: Pt able to follow exercise prescription today without complaint.  Will continue to monitor for progression.    Dr. Bethann Punches is Medical Director for [facility] Cardiac Rehabilitation.  Dr. Vida Rigger is Medical Director for [facility] Pulmonary Rehabilitation.

## 2022-09-22 ENCOUNTER — Ambulatory Visit: Payer: Medicare Other | Admitting: Internal Medicine

## 2022-09-22 ENCOUNTER — Encounter: Payer: Medicare Other | Admitting: *Deleted

## 2022-09-22 DIAGNOSIS — J439 Emphysema, unspecified: Secondary | ICD-10-CM

## 2022-09-22 NOTE — Progress Notes (Signed)
Daily Session Note  Patient Details  Name: Levi [name] MRN: [id number] Date of Birth: [birth year] Referring Provider:   Dor[facility] Devoid [facility] Rehab from 05/26/2022 in [facility] Cardiac and Pulmonary Rehab  Referring Provider [name] MD       Encounter Date: 09/22/2022  Check In:  Session Check In - 09/22/22 1116       Check-In   Supervising physician immediately available to respond to emergencies See telemetry face sheet for immediately available ER MD    Location [facility]    Staff Present Lanny Hurst, RN, ADN;Joseph [place], RCP,RRT,BSRT;Other   [place], MS   Virtual Visit No    Medication changes reported     No    Fall or balance concerns reported    No    Warm-up and Cool-down Performed on first and last piece of equipment    Resistance Training Performed Yes    VAD Patient? No    PAD/SET Patient? No      Pain Assessment   Currently in Pain? No/denies                Social History   Tobacco Use  Smoking Status Former   Packs/day: 0.25   Years: 62.00   Additional pack years: 0.00   Total pack years: 15.50   Types: Cigarettes   Quit date: 09/05/2018   Years since quitting: 4.0  Smokeless Tobacco Never    Goals Met:  Independence with exercise equipment Exercise tolerated well No report of concerns or symptoms today Strength training completed today  Goals Unmet:  Not Applicable  Comments: Pt able to follow exercise prescription today without complaint.  Will continue to monitor for progression.    Dr. Bethann Punches is Medical Director for [facility] Cardiac Rehabilitation.  Dr. Vida Rigger is Medical Director for [facility] Pulmonary Rehabilitation.

## 2022-09-27 ENCOUNTER — Encounter: Payer: Medicare Other | Admitting: *Deleted

## 2022-09-27 DIAGNOSIS — J439 Emphysema, unspecified: Secondary | ICD-10-CM | POA: Diagnosis not present

## 2022-09-27 NOTE — Progress Notes (Signed)
Daily Session Note  Patient Details  Name: Levi [name] MRN: [id number] Date of Birth: February 14, 1941 Referring Provider:   Dor[facility] Devoid [facility] Rehab from 05/26/2022 in [facility] Cardiac and Pulmonary Rehab  Referring Provider [name] MD       Encounter Date: 09/27/2022  Check In:  Session Check In - 09/27/22 1028       Check-In   Supervising physician immediately available to respond to emergencies See telemetry face sheet for immediately available ER MD    Location [facility] Pulmonary Rehab    Staff Present Cora Collum, RN, BSN, CCRP;Laureen Manson Passey, BS, RRT, CPFT;Noah Tickle, BS, Exercise Physiologist    Virtual Visit No    Medication changes reported     No    Fall or balance concerns reported    No    Warm-up and Cool-down Performed on first and last piece of equipment    Resistance Training Performed Yes    VAD Patient? No    PAD/SET Patient? No      Pain Assessment   Currently in Pain? No/denies                Social History   Tobacco Use  Smoking Status Former   Packs/day: 0.25   Years: 62.00   Additional pack years: 0.00   Total pack years: 15.50   Types: Cigarettes   Quit date: 09/05/2018   Years since quitting: 4.0  Smokeless Tobacco Never    Goals Met:  Proper associated with RPD/PD & O2 Sat Independence with exercise equipment Exercise tolerated well No report of concerns or symptoms today  Goals Unmet:  Not Applicable  Comments: Pt able to follow exercise prescription today without complaint.  Will continue to monitor for progression.    Dr. Bethann Punches is Medical Director for [facility] Cardiac Rehabilitation.  Dr. Vida Rigger is Medical Director for [facility] Pulmonary Rehabilitation.

## 2022-09-29 ENCOUNTER — Encounter: Payer: Medicare Other | Admitting: *Deleted

## 2022-09-29 VITALS — Ht 71.0 in | Wt 268.9 lb

## 2022-09-29 DIAGNOSIS — J439 Emphysema, unspecified: Secondary | ICD-10-CM | POA: Diagnosis not present

## 2022-09-29 NOTE — Patient Instructions (Addendum)
Discharge Patient Instructions  Patient Details  Name: Levi [name] MRN: [id number] Date of Birth: 1940/12/11 Referring Provider:  [facility]   Number of Visits: 20  Reason for Discharge:  Patient reached a stable level of exercise. Patient independent in their exercise. Patient has met program and personal goals.  Smoking History:  Social History   Tobacco Use  Smoking Status Former   Packs/day: 0.25   Years: 62.00   Additional pack years: 0.00   Total pack years: 15.50   Types: Cigarettes   Quit date: 09/05/2018   Years since quitting: 4.0  Smokeless Tobacco Never    Diagnosis:  Pulmonary emphysema, unspecified emphysema type (HCC)  Initial Exercise Prescription:  Initial Exercise Prescription - 05/26/22 1600       Date of Initial Exercise RX and Referring Provider   Date 05/26/22    Referring Provider [name] MD      Oxygen   Maintain Oxygen Saturation 88% or higher      Recumbant Bike   Level 1    RPM 60    Watts 15    Minutes 15    METs 1.1      T5 Nustep   Level 1    SPM 80    Minutes 15    METs 1.1      Biostep-RELP   Level 1    SPM 50    Minutes 15    METs 1.1      Track   Laps 17    Minutes 15    METs 1.92      Prescription Details   Frequency (times per week) 2    Duration Progress to 30 minutes of continuous aerobic without signs/symptoms of physical distress      Intensity   THRR 40-80% of Max Heartrate 96 - 126    Ratings of Perceived Exertion 11-13    Perceived Dyspnea 0-4      Progression   Progression Continue to progress workloads to maintain intensity without signs/symptoms of physical distress.      Resistance Training   Training Prescription Yes    Weight 4 lb    Reps 10-15             Discharge Exercise Prescription (Final Exercise Prescription Changes):  Exercise Prescription Changes - 09/19/22 1600       Response to Exercise   Blood Pressure (Admit) 114/62    Blood Pressure (Exit)  114/60    Heart Rate (Admit) 64 bpm    Heart Rate (Exercise) 95 bpm    Heart Rate (Exit) 84 bpm    Oxygen Saturation (Admit) 95 %    Oxygen Saturation (Exercise) 89 %    Oxygen Saturation (Exit) 93 %    Rating of Perceived Exertion (Exercise) 13    Perceived Dyspnea (Exercise) 2    Symptoms SOB    Duration Continue with 30 min of aerobic exercise without signs/symptoms of physical distress.    Intensity THRR unchanged      Progression   Progression Continue to progress workloads to maintain intensity without signs/symptoms of physical distress.    Average METs 2.24      Resistance Training   Training Prescription Yes    Weight 6 lb    Reps 10-15      Interval Training   Interval Training No      Treadmill   MPH 1    Grade 0    Minutes 15    METs  1.77      Arm Ergometer   Level 2    Minutes 15    METs 2.3      Biostep-RELP   Level 3    Minutes 15    METs 3      Track   Laps 27    Minutes 15    METs 2.47      Home Exercise Plan   Plans to continue exercise at [employer] (comment)   [employer] and walking   Frequency Add 3 additional days to program exercise sessions.    Initial Home Exercises Provided 07/12/22      Oxygen   Maintain Oxygen Saturation 88% or higher             Functional Capacity:  6 Minute Walk     Row Name 05/26/22 1554 09/29/22 1003       6 Minute Walk   Phase Initial Discharge    Distance 890 feet 1100 feet    Distance % Change -- 23.6 %    Distance Feet Change -- 210 ft    Walk Time 6 minutes 6 minutes    # of Rest Breaks 0 0    MPH 1.68 2.08    METS 1.15 1.81    RPE 13 13    Perceived Dyspnea  3 3    VO2 Peak 4.03 6.33    Symptoms Yes (comment) Yes (comment)    Comments Back pain caused from sciatica 9/10, SOB back pain 9/10, SOB    Resting HR 66 bpm 66 bpm    Resting BP 140/76 108/64    Resting Oxygen Saturation  93 % 90 %    Exercise Oxygen Saturation  during 6 min walk 86 % 84 %    Max Ex. HR 94 bpm 112 bpm     Max Ex. BP 148/74 164/86    2 Minute Post BP 136/76 134/70      Interval HR   1 Minute HR 81 --    2 Minute HR 91 --    3 Minute HR 94 --    4 Minute HR 94 --    5 Minute HR 91 --    6 Minute HR 90 --    2 Minute Post HR 93 --    Interval Heart Rate? Yes --      Interval Oxygen   Interval Oxygen? Yes --    Baseline Oxygen Saturation % 93 % --    1 Minute Oxygen Saturation % 91 % --    1 Minute Liters of Oxygen 0 L  RA --    2 Minute Oxygen Saturation % 88 % --    2 Minute Liters of Oxygen 0 L --    3 Minute Oxygen Saturation % 87 % --    3 Minute Liters of Oxygen 0 L --    4 Minute Oxygen Saturation % 88 % --    4 Minute Liters of Oxygen 0 L --    5 Minute Oxygen Saturation % 87 % --    5 Minute Liters of Oxygen 0 L --    6 Minute Oxygen Saturation % 86 % --    6 Minute Liters of Oxygen 0 L --    2 Minute Post Oxygen Saturation % 93 % --    2 Minute Post Liters of Oxygen 0 L --           Nutrition & Weight - Outcomes:  Pre Biometrics -  05/26/22 1553       Pre Biometrics   Height 5\' 11"  (1.803 m)    Weight 270 lb 8 oz (122.7 kg)    Waist Circumference 55 inches    Hip Circumference 48.5 inches    Waist to Hip Ratio 1.13 %    BMI (Calculated) 37.74    Single Leg Stand 3.2 seconds             Post Biometrics - 09/29/22 1004        Post  Biometrics   Height 5\' 11"  (1.803 m)    Weight 268 lb 14.4 oz (122 kg)    Waist Circumference 49.5 inches    Hip Circumference 48.5 inches    Waist to Hip Ratio 1.02 %    BMI (Calculated) 37.52    Single Leg Stand 8.6 seconds             Nutrition:  Nutrition Therapy & Goals - 05/26/22 1547       Nutrition Therapy   RD appointment deferred Yes   Deferred     Intervention Plan   Intervention Prescribe, educate and counsel regarding individualized specific dietary modifications aiming towards targeted core components such as weight, hypertension, lipid management, diabetes, heart failure and other  comorbidities.    Expected Outcomes Short Term Goal: Understand basic principles of dietary content, such as calories, fat, sodium, cholesterol and nutrients.;Short Term Goal: A plan has been developed with personal nutrition goals set during dietitian appointment.;Long Term Goal: Adherence to prescribed nutrition plan.           Goals reviewed with patient; copy given to patient.

## 2022-09-29 NOTE — Progress Notes (Signed)
Daily Session Note  Patient Details  Name: Levi [name] MRN: [id number] Date of Birth: Mar 16, 1941 Referring Provider:   Doristine Devoid Pulmonary Rehab from 05/26/2022 in [facility] Cardiac and Pulmonary Rehab  Referring Provider [name] MD       Encounter Date: 09/29/2022  Check In:  Session Check In - 09/29/22 1044       Check-In   Supervising physician immediately available to respond to emergencies See telemetry face sheet for immediately available ER MD    Location [facility]-Cardiac & Pulmonary Rehab    Staff Present Lanny Hurst, RN, ADN;Meredith Jewel Baize, RN BSN;Denise Rhew, PhD, RN, CNS, CEN;Other   [place], [place]   Virtual Visit No    Medication changes reported     No    Fall or balance concerns reported    No    Warm-up and Cool-down Performed on first and last piece of equipment    Resistance Training Performed Yes    VAD Patient? No    PAD/SET Patient? No      Pain Assessment   Currently in Pain? No/denies                Social History   Tobacco Use  Smoking Status Former   Packs/day: 0.25   Years: 62.00   Additional pack years: 0.00   Total pack years: 15.50   Types: Cigarettes   Quit date: 09/05/2018   Years since quitting: 4.0  Smokeless Tobacco Never    Goals Met:  Independence with exercise equipment Exercise tolerated well No report of concerns or symptoms today Strength training completed today  Goals Unmet:  Not Applicable  Comments: Pt able to follow exercise prescription today without complaint.  Will continue to monitor for progression.   6 Minute Walk     Row Name 05/26/22 1554 09/29/22 1003       6 Minute Walk   Phase Initial Discharge    Distance 890 feet 1100 feet    Distance % Change -- 23.6 %    Distance Feet Change -- 210 ft    Walk Time 6 minutes 6 minutes    # of Rest Breaks 0 0    MPH 1.68 2.08    METS 1.15 1.81    RPE 13 13    Perceived Dyspnea  3 3    VO2 Peak 4.03 6.33    Symptoms Yes (comment) Yes  (comment)    Comments Back pain caused from sciatica 9/10, SOB back pain 9/10, SOB    Resting HR 66 bpm 66 bpm    Resting BP 140/76 108/64    Resting Oxygen Saturation  93 % 90 %    Exercise Oxygen Saturation  during 6 min walk 86 % 84 %    Max Ex. HR 94 bpm 112 bpm    Max Ex. BP 148/74 164/86    2 Minute Post BP 136/76 134/70      Interval HR   1 Minute HR 81 --    2 Minute HR 91 --    3 Minute HR 94 --    4 Minute HR 94 --    5 Minute HR 91 --    6 Minute HR 90 --    2 Minute Post HR 93 --    Interval Heart Rate? Yes --      Interval Oxygen   Interval Oxygen? Yes --    Baseline Oxygen Saturation % 93 % --    1 Minute Oxygen Saturation %  91 % --    1 Minute Liters of Oxygen 0 L  RA --    2 Minute Oxygen Saturation % 88 % --    2 Minute Liters of Oxygen 0 L --    3 Minute Oxygen Saturation % 87 % --    3 Minute Liters of Oxygen 0 L --    4 Minute Oxygen Saturation % 88 % --    4 Minute Liters of Oxygen 0 L --    5 Minute Oxygen Saturation % 87 % --    5 Minute Liters of Oxygen 0 L --    6 Minute Oxygen Saturation % 86 % --    6 Minute Liters of Oxygen 0 L --    2 Minute Post Oxygen Saturation % 93 % --    2 Minute Post Liters of Oxygen 0 L --               Dr. Bethann Punches is Medical Director for [facility] Cardiac Rehabilitation.  Dr. Vida Rigger is Medical Director for [facility] Pulmonary Rehabilitation.

## 2022-10-04 ENCOUNTER — Encounter: Payer: Medicare Other | Attending: Student in an Organized Health Care Education/Training Program | Admitting: *Deleted

## 2022-10-04 DIAGNOSIS — C3490 Malignant neoplasm of unspecified part of unspecified bronchus or lung: Secondary | ICD-10-CM | POA: Diagnosis present

## 2022-10-04 DIAGNOSIS — J439 Emphysema, unspecified: Secondary | ICD-10-CM | POA: Insufficient documentation

## 2022-10-04 NOTE — Progress Notes (Signed)
Daily Session Note  Patient Details  Name: TROY SOLDO MRN: [id number] Date of Birth: March 03, 1941 Referring Provider:   Doristine Devoid Pulmonary Rehab from 05/26/2022 in [facility] Cardiac and Pulmonary Rehab  Referring Provider [name] MD       Encounter Date: 10/04/2022  Check In:  Session Check In - 10/04/22 1127       Check-In   Supervising physician immediately available to respond to emergencies See telemetry face sheet for immediately available ER MD    Location [facility]-Cardiac & Pulmonary Rehab    Staff Present Cyndia Diver, RN, BSN, MA;Susanne Bice, RN, BSN, CCRP;Jessica Hawkins, MA, RCEP, CCRP, CCET    Virtual Visit No    Medication changes reported     No    Fall or balance concerns reported    No    Tobacco Cessation No Change    Warm-up and Cool-down Performed on first and last piece of equipment    Resistance Training Performed Yes    VAD Patient? No      Pain Assessment   Currently in Pain? No/denies                Social History   Tobacco Use  Smoking Status Former   Packs/day: 0.25   Years: 62.00   Additional pack years: 0.00   Total pack years: 15.50   Types: Cigarettes   Quit date: 09/05/2018   Years since quitting: 4.0  Smokeless Tobacco Never    Goals Met:  Independence with exercise equipment Exercise tolerated well No report of concerns or symptoms today  Goals Unmet:  Not Applicable  Comments: Pt able to follow exercise prescription today without complaint.  Will continue to monitor for progression.    Dr. Bethann Punches is Medical Director for [facility] Cardiac Rehabilitation.  Dr. Vida Rigger is Medical Director for [facility] Pulmonary Rehabilitation.

## 2022-10-06 ENCOUNTER — Encounter: Payer: Medicare Other | Admitting: *Deleted

## 2022-10-06 DIAGNOSIS — J439 Emphysema, unspecified: Secondary | ICD-10-CM | POA: Diagnosis not present

## 2022-10-06 NOTE — Progress Notes (Signed)
Daily Session Note  Patient Details  Name: Levi [name] MRN: [id number] Date of Birth: 09-Dec-1940 Referring Provider:   Doristine Devoid Pulmonary Rehab from 05/26/2022 in [facility] Cardiac and Pulmonary Rehab  Referring Provider [name] MD       Encounter Date: 10/06/2022  Check In:  Session Check In - 10/06/22 1142       Check-In   Supervising physician immediately available to respond to emergencies See telemetry face sheet for immediately available ER MD    Location [facility]-Cardiac & Pulmonary Rehab    Staff Present Cora Collum, RN, BSN, CCRP;Joseph [place], RCP,RRT,BSRT;Other   [place]el[place] HFS   Virtual Visit No    Medication changes reported     No    Fall or balance concerns reported    No    Warm-up and Cool-down Performed on first and last piece of equipment    VAD Patient? No    PAD/SET Patient? No      Pain Assessment   Currently in Pain? No/denies                Social History   Tobacco Use  Smoking Status Former   Packs/day: 0.25   Years: 62.00   Additional pack years: 0.00   Total pack years: 15.50   Types: Cigarettes   Quit date: 09/05/2018   Years since quitting: 4.0  Smokeless Tobacco Never    Goals Met:  Proper associated with RPD/PD & O2 Sat Independence with exercise equipment Exercise tolerated well No report of concerns or symptoms today  Goals Unmet:  Not Applicable  Comments: Pt able to follow exercise prescription today without complaint.  Will continue to monitor for progression.    Dr. Bethann Punches is Medical Director for [facility] Cardiac Rehabilitation.  Dr. Vida Rigger is Medical Director for [facility] Pulmonary Rehabilitation.

## 2022-10-10 ENCOUNTER — Encounter: Payer: Self-pay | Admitting: *Deleted

## 2022-10-10 NOTE — Progress Notes (Signed)
Adiding outcome data

## 2022-10-11 ENCOUNTER — Encounter: Payer: Medicare Other | Admitting: *Deleted

## 2022-10-11 DIAGNOSIS — J439 Emphysema, unspecified: Secondary | ICD-10-CM

## 2022-10-11 NOTE — Progress Notes (Signed)
Daily Session Note  Patient Details  Name: MARICO BUCKLE MRN: [id number] Date of Birth: [birth year] Referring Provider:   Doristine Devoid Pulmonary Rehab from 05/26/2022 in [facility] Cardiac and Pulmonary Rehab  Referring Provider [name] MD       Encounter Date: 10/11/2022  Check In:  Session Check In - 10/11/22 1036       Check-In   Supervising physician immediately available to respond to emergencies See telemetry face sheet for immediately available ER MD    Location [facility]-Cardiac & Pulmonary Rehab    Staff Present Susann Givens, RN BSN;Joseph [place], [place];Cora Collum, RN, BSN, CCRP    Virtual Visit No    Medication changes reported     No    Fall or balance concerns reported    No    Warm-up and Cool-down Performed on first and last piece of equipment    Resistance Training Performed Yes    VAD Patient? No    PAD/SET Patient? No      Pain Assessment   Currently in Pain? No/denies                Social History   Tobacco Use  Smoking Status Former   Packs/day: 0.25   Years: 62.00   Additional pack years: 0.00   Total pack years: 15.50   Types: Cigarettes   Quit date: 09/05/2018   Years since quitting: 4.1  Smokeless Tobacco Never    Goals Met:  Independence with exercise equipment Exercise tolerated well No report of concerns or symptoms today Strength training completed today  Goals Unmet:  Not Applicable  Comments: Pt able to follow exercise prescription today without complaint.  Will continue to monitor for progression.    Dr. Bethann Punches is Medical Director for [facility] Cardiac Rehabilitation.  Dr. Vida Rigger is Medical Director for [facility] Pulmonary Rehabilitation.

## 2022-10-12 ENCOUNTER — Encounter: Payer: Self-pay | Admitting: *Deleted

## 2022-10-12 DIAGNOSIS — J439 Emphysema, unspecified: Secondary | ICD-10-CM

## 2022-10-12 NOTE — Progress Notes (Signed)
Pulmonary Individual Treatment Plan  Patient Details  Name: Levi [name] MRN: [id number] Date of Birth: 1941-03-10 Referring Provider:   Doristine Devoid Pulmonary Rehab from 05/26/2022 in [facility] Cardiac and Pulmonary Rehab  Referring Provider [name] MD       Initial Encounter Date:  Flowsheet Row Pulmonary Rehab from 05/26/2022 in [facility] Cardiac and Pulmonary Rehab  Date 05/26/22       Visit Diagnosis: Pulmonary emphysema, unspecified emphysema type (HCC)  Patient's Home Medications on Admission:  Current Outpatient Medications:    albuterol (PROVENTIL HFA;VENTOLIN HFA) 108 (90 Base) MCG/ACT inhaler, Inhale 2 puffs into the lungs every 6 (six) hours as needed for wheezing or shortness of breath., Disp: 1 Inhaler, Rfl: 0   apixaban (ELIQUIS) 5 MG TABS tablet, Take 5 mg by mouth 2 (two) times daily., Disp: , Rfl:    carboxymethylcellulose (REFRESH PLUS) 0.5 % SOLN, 1 drop 4 (four) times daily as needed., Disp: , Rfl:    finasteride (PROSCAR) 5 MG tablet, Take 5 mg by mouth daily., Disp: , Rfl:    fluticasone-salmeterol (ADVAIR) 250-50 MCG/ACT AEPB, Inhale 1 puff into the lungs in the morning and at bedtime., Disp: , Rfl:    levothyroxine (SYNTHROID) 200 MCG tablet, Take 200 mcg by mouth daily before breakfast., Disp: , Rfl:    SEMAGLUTIDE,0.25 OR 0.5MG /DOS, [place], Inject 0.5 mg into the skin once a week., Disp: , Rfl:    tamsulosin (FLOMAX) 0.4 MG CAPS capsule, 0.8 mg daily., Disp: , Rfl:    Tiotropium Bromide Monohydrate (SPIRIVA RESPIMAT) 2.5 MCG/ACT AERS, Inhale 2 puffs into the lungs daily., Disp: 1 each, Rfl: 11   torsemide (DEMADEX) 20 MG tablet, Take 1 tablet (20 mg total) by mouth every other day., Disp: 45 tablet, Rfl: 1   vitamin B-12 (CYANOCOBALAMIN) 1000 MCG tablet, Take 1,000 mcg by mouth daily., Disp: , Rfl:   Past Medical History: Past Medical History:  Diagnosis Date   Asthma    COPD (chronic obstructive pulmonary disease) (HCC)    Hearing loss    Hypertension     Hypothyroidism    Mass of left lung    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HCC)    Prostate enlargement    Shortness of breath    Squamous cell carcinoma of lung, left (HCC)    Thyroid disease    Tobacco abuse     Tobacco Use: Social History   Tobacco Use  Smoking Status Former   Packs/day: 0.25   Years: 62.00   Additional pack years: 0.00   Total pack years: 15.50   Types: Cigarettes   Quit date: 09/05/2018   Years since quitting: 4.1  Smokeless Tobacco Never    Labs: Review Flowsheet       Latest Ref Rng & Units 10/15/2018 03/09/2021  Labs for ITP Cardiac and Pulmonary Rehab  Hemoglobin A1c 4.8 - 5.6 % - 7.8   PH, Arterial 7.350 - 7.450 7.42  -  PCO2 arterial 32.0 - 48.0 mmHg 35  -  Bicarbonate 20.0 - 28.0 mmol/L 22.7  -  Acid-base deficit 0.0 - 2.0 mmol/L 1.3  -  O2 Saturation % 93.1  -     Pulmonary Assessment Scores:  Pulmonary Assessment Scores     Row Name 05/26/22 1539 10/10/22 1043       ADL UCSD   ADL Phase Entry Exit    SOB Score total 77 71    Rest 0 2    Walk 3 2    Stairs  4 3    Bath 3 3    Dress 3 2    Shop 3 3      CAT Score   CAT Score 21 21      mMRC Score   mMRC Score 2 --             UCSD: Self-administered rating of dyspnea associated with activities of daily living (ADLs) 6-point scale (0 = "not at all" to 5 = "maximal or unable to do because of breathlessness")  Scoring Scores range from 0 to 120.  Minimally important difference is 5 units  CAT: CAT can identify the health impairment of COPD patients and is better correlated with disease progression.  CAT has a scoring range of zero to 40. The CAT score is classified into four groups of low (less than 10), medium (10 - 20), high (21-30) and very high (31-40) based on the impact level of disease on health status. A CAT score over 10 suggests significant symptoms.  A worsening CAT score could be explained by an exacerbation, poor medication adherence, poor inhaler technique, or  progression of COPD or comorbid conditions.  CAT MCID is 2 points  mMRC: mMRC (Modified Medical Research Council) Dyspnea Scale is used to assess the degree of baseline functional disability in patients of respiratory disease due to dyspnea. No minimal important difference is established. A decrease in score of 1 point or greater is considered a positive change.   Pulmonary Function Assessment:   Exercise Target Goals: Exercise Program Goal: Individual exercise prescription set using results from initial 6 min walk test and THRR while considering  patient's activity barriers and safety.   Exercise Prescription Goal: Initial exercise prescription builds to 30-45 minutes a day of aerobic activity, 2-3 days per week.  Home exercise guidelines will be given to patient during program as part of exercise prescription that the participant will acknowledge.  Education: Aerobic Exercise: - Group verbal and visual presentation on the components of exercise prescription. Introduces F.I.T.T principle from ACSM for exercise prescriptions.  Reviews F.I.T.T. principles of aerobic exercise including progression. Written material given at graduation.   Education: Resistance Exercise: - Group verbal and visual presentation on the components of exercise prescription. Introduces F.I.T.T principle from ACSM for exercise prescriptions  Reviews F.I.T.T. principles of resistance exercise including progression. Written material given at graduation.    Education: Exercise & Equipment Safety: - Individual verbal instruction and demonstration of equipment use and safety with use of the equipment. Flowsheet Row Pulmonary Rehab from 09/01/2022 in [facility] Cardiac and Pulmonary Rehab  Education need identified 05/26/22  Date 05/26/22  Educator KW  Instruction Review Code 1- Verbalizes Understanding       Education: Exercise Physiology & General Exercise Guidelines: - Group verbal and written instruction with models  to review the exercise physiology of the cardiovascular system and associated critical values. Provides general exercise guidelines with specific guidelines to those with heart or lung disease.    Education: Flexibility, Balance, Mind/Body Relaxation: - Group verbal and visual presentation with interactive activity on the components of exercise prescription. Introduces F.I.T.T principle from ACSM for exercise prescriptions. Reviews F.I.T.T. principles of flexibility and balance exercise training including progression. Also discusses the mind body connection.  Reviews various relaxation techniques to help reduce and manage stress (i.e. Deep breathing, progressive muscle relaxation, and visualization). Balance handout provided to take home. Written material given at graduation. Flowsheet Row Pulmonary Rehab from 10/08/2020 in [facility] Cardiac and Pulmonary Rehab  Date 09/03/20  Educator AS  Instruction Review Code 1- Verbalizes Understanding       Activity Barriers & Risk Stratification:  Activity Barriers & Cardiac Risk Stratification - 05/26/22 1552       Activity Barriers & Cardiac Risk Stratification   Activity Barriers Deconditioning;Shortness of Breath;Muscular Weakness;Back Problems    Comments Sciatica             6 Minute Walk:  6 Minute Walk     Row Name 05/26/22 1554 09/29/22 1003       6 Minute Walk   Phase Initial Discharge    Distance 890 feet 1100 feet    Distance % Change -- 23.6 %    Distance Feet Change -- 210 ft    Walk Time 6 minutes 6 minutes    # of Rest Breaks 0 0    MPH 1.68 2.08    METS 1.15 1.81    RPE 13 13    Perceived Dyspnea  3 3    VO2 Peak 4.03 6.33    Symptoms Yes (comment) Yes (comment)    Comments Back pain caused from sciatica 9/10, SOB back pain 9/10, SOB    Resting HR 66 bpm 66 bpm    Resting BP 140/76 108/64    Resting Oxygen Saturation  93 % 90 %    Exercise Oxygen Saturation  during 6 min walk 86 % 84 %    Max Ex. HR 94 bpm 112 bpm     Max Ex. BP 148/74 164/86    2 Minute Post BP 136/76 134/70      Interval HR   1 Minute HR 81 --    2 Minute HR 91 --    3 Minute HR 94 --    4 Minute HR 94 --    5 Minute HR 91 --    6 Minute HR 90 --    2 Minute Post HR 93 --    Interval Heart Rate? Yes --      Interval Oxygen   Interval Oxygen? Yes --    Baseline Oxygen Saturation % 93 % --    1 Minute Oxygen Saturation % 91 % --    1 Minute Liters of Oxygen 0 L  RA --    2 Minute Oxygen Saturation % 88 % --    2 Minute Liters of Oxygen 0 L --    3 Minute Oxygen Saturation % 87 % --    3 Minute Liters of Oxygen 0 L --    4 Minute Oxygen Saturation % 88 % --    4 Minute Liters of Oxygen 0 L --    5 Minute Oxygen Saturation % 87 % --    5 Minute Liters of Oxygen 0 L --    6 Minute Oxygen Saturation % 86 % --    6 Minute Liters of Oxygen 0 L --    2 Minute Post Oxygen Saturation % 93 % --    2 Minute Post Liters of Oxygen 0 L --            Oxygen Initial Assessment:  Oxygen Initial Assessment - 05/26/22 1538       Home Oxygen   Home Oxygen Device None   prn only- does not use currently   Sleep Oxygen Prescription None    Home Exercise Oxygen Prescription None    Home Resting Oxygen Prescription None    Compliance with Home Oxygen Use Yes   prn  Initial 6 min Walk   Oxygen Used None      Program Oxygen Prescription   Program Oxygen Prescription None   prn     Intervention   Short Term Goals To learn and understand importance of monitoring SPO2 with pulse oximeter and demonstrate accurate use of the pulse oximeter.;To learn and understand importance of maintaining oxygen saturations>88%;To learn and demonstrate proper pursed lip breathing techniques or other breathing techniques. ;To learn and demonstrate proper use of respiratory medications;To learn and exhibit compliance with exercise, home and travel O2 prescription    Long  Term Goals Maintenance of O2 saturations>88%;Exhibits proper breathing  techniques, such as pursed lip breathing or other method taught during program session;Exhibits compliance with exercise, home  and travel O2 prescription;Compliance with respiratory medication;Demonstrates proper use of MDI's;Verbalizes importance of monitoring SPO2 with pulse oximeter and return demonstration             Oxygen Re-Evaluation:  Oxygen Re-Evaluation     Row Name 06/16/22 1024 07/12/22 1004 07/26/22 1031 08/23/22 1026 09/27/22 1030     Program Oxygen Prescription   Program Oxygen Prescription None None None None None     Home Oxygen   Home Oxygen Device None None None None None   Sleep Oxygen Prescription None None None None None   Home Exercise Oxygen Prescription None None None None None   Home Resting Oxygen Prescription None None None None None   Compliance with Home Oxygen Use Yes Yes  uses as needed at home -- -- --     Goals/Expected Outcomes   Short Term Goals To learn and understand importance of monitoring SPO2 with pulse oximeter and demonstrate accurate use of the pulse oximeter.;To learn and understand importance of maintaining oxygen saturations>88%;To learn and demonstrate proper pursed lip breathing techniques or other breathing techniques. ;To learn and demonstrate proper use of respiratory medications;To learn and exhibit compliance with exercise, home and travel O2 prescription To learn and understand importance of monitoring SPO2 with pulse oximeter and demonstrate accurate use of the pulse oximeter.;To learn and understand importance of maintaining oxygen saturations>88%;To learn and demonstrate proper pursed lip breathing techniques or other breathing techniques. ;To learn and demonstrate proper use of respiratory medications;To learn and exhibit compliance with exercise, home and travel O2 prescription To learn and understand importance of monitoring SPO2 with pulse oximeter and demonstrate accurate use of the pulse oximeter.;To learn and understand  importance of maintaining oxygen saturations>88%;To learn and demonstrate proper pursed lip breathing techniques or other breathing techniques. ;To learn and demonstrate proper use of respiratory medications;To learn and exhibit compliance with exercise, home and travel O2 prescription To learn and understand importance of monitoring SPO2 with pulse oximeter and demonstrate accurate use of the pulse oximeter.;To learn and understand importance of maintaining oxygen saturations>88%;To learn and demonstrate proper pursed lip breathing techniques or other breathing techniques. ;To learn and demonstrate proper use of respiratory medications;To learn and exhibit compliance with exercise, home and travel O2 prescription To learn and understand importance of monitoring SPO2 with pulse oximeter and demonstrate accurate use of the pulse oximeter.;To learn and understand importance of maintaining oxygen saturations>88%;To learn and demonstrate proper pursed lip breathing techniques or other breathing techniques. ;To learn and demonstrate proper use of respiratory medications;To learn and exhibit compliance with exercise, home and travel O2 prescription   Long  Term Goals Maintenance of O2 saturations>88%;Exhibits proper breathing techniques, such as pursed lip breathing or other method taught during program session;Exhibits compliance with exercise, home  and travel O2 prescription;Compliance with respiratory medication;Demonstrates proper use of MDI's;Verbalizes importance of monitoring SPO2 with pulse oximeter and return demonstration Maintenance of O2 saturations>88%;Exhibits proper breathing techniques, such as pursed lip breathing or other method taught during program session;Exhibits compliance with exercise, home  and travel O2 prescription;Compliance with respiratory medication;Demonstrates proper use of MDI's;Verbalizes importance of monitoring SPO2 with pulse oximeter and return demonstration Maintenance of O2  saturations>88%;Exhibits proper breathing techniques, such as pursed lip breathing or other method taught during program session;Exhibits compliance with exercise, home  and travel O2 prescription;Compliance with respiratory medication;Demonstrates proper use of MDI's;Verbalizes importance of monitoring SPO2 with pulse oximeter and return demonstration Maintenance of O2 saturations>88%;Exhibits proper breathing techniques, such as pursed lip breathing or other method taught during program session;Exhibits compliance with exercise, home  and travel O2 prescription;Compliance with respiratory medication;Demonstrates proper use of MDI's;Verbalizes importance of monitoring SPO2 with pulse oximeter and return demonstration Maintenance of O2 saturations>88%;Exhibits proper breathing techniques, such as pursed lip breathing or other method taught during program session;Exhibits compliance with exercise, home  and travel O2 prescription;Compliance with respiratory medication;Demonstrates proper use of MDI's;Verbalizes importance of monitoring SPO2 with pulse oximeter and return demonstration   Comments Levi [name] went to the [place] yesterday where they did a stress test to assess his breathing. [facility]. We also reviewed PLB with the patient. Levi [name] is good about watching his saturations and using his PLB to help bring it back up.  His breathing is still his biggest limiting factor as it wears him out quickly.  He tries to work on breathing daily to help and good about using his inhalers too. Levi [name] is doing well in rehab.  He is feeling better with breathing, but it is his biggest limitation.  He does have oxygen to use at home for when he feels really SOB.  He is good about using his PLB and will monitor his saturations.  He uses his nebulizer on occassion. Levi [name] continues to do well with monitoring his breathing and saturations.  His numbers continue to drop some with walking,  but he will pause and use PLB to get it back up.  He contineus to use his inhalers and nebulizer. Levi [name] is doing well in rehab.  He is good about watching his saturations.  They have been dropping when he walks, but he will rest and use PLB to recover quickly.  He is feeling pretty well managed on his breathing and thinks part of it may be his afib as well.   Goals/Expected Outcomes Short: Receive results to stress test. Long: Become proficient with PLB. Short: Continue to work on PLB Long: continue to work on breathing regularly Short: conitnue to use PLB routinely Long: conitnue to improve breathing. Short: Contineu to use PLB and monitor saturations Long: Continue to manage pulmonary disease Short: Continue to keep close eye on saturations Long; Continue to montior disease            Oxygen Discharge (Final Oxygen Re-Evaluation):  Oxygen Re-Evaluation - 09/27/22 1030       Program Oxygen Prescription   Program Oxygen Prescription None      Home Oxygen   Home Oxygen Device None    Sleep Oxygen Prescription None    Home Exercise Oxygen Prescription None    Home Resting Oxygen Prescription None      Goals/Expected Outcomes   Short Term Goals To learn and understand importance of monitoring SPO2 with pulse oximeter and  demonstrate accurate use of the pulse oximeter.;To learn and understand importance of maintaining oxygen saturations>88%;To learn and demonstrate proper pursed lip breathing techniques or other breathing techniques. ;To learn and demonstrate proper use of respiratory medications;To learn and exhibit compliance with exercise, home and travel O2 prescription    Long  Term Goals Maintenance of O2 saturations>88%;Exhibits proper breathing techniques, such as pursed lip breathing or other method taught during program session;Exhibits compliance with exercise, home  and travel O2 prescription;Compliance with respiratory medication;Demonstrates proper use of MDI's;Verbalizes  importance of monitoring SPO2 with pulse oximeter and return demonstration    Comments Pascal is doing well in rehab.  He is good about watching his saturations.  They have been dropping when he walks, but he will rest and use PLB to recover quickly.  He is feeling pretty well managed on his breathing and thinks part of it may be his afib as well.    Goals/Expected Outcomes Short: Continue to keep close eye on saturations Long; Continue to montior disease             Initial Exercise Prescription:  Initial Exercise Prescription - 05/26/22 1600       Date of Initial Exercise RX and Referring Provider   Date 05/26/22    Referring Provider [name] MD      Oxygen   Maintain Oxygen Saturation 88% or higher      Recumbant Bike   Level 1    RPM 60    Watts 15    Minutes 15    METs 1.1      T5 Nustep   Level 1    SPM 80    Minutes 15    METs 1.1      Biostep-RELP   Level 1    SPM 50    Minutes 15    METs 1.1      Track   Laps 17    Minutes 15    METs 1.92      Prescription Details   Frequency (times per week) 2    Duration Progress to 30 minutes of continuous aerobic without signs/symptoms of physical distress      Intensity   THRR 40-80% of Max Heartrate 96 - 126    Ratings of Perceived Exertion 11-13    Perceived Dyspnea 0-4      Progression   Progression Continue to progress workloads to maintain intensity without signs/symptoms of physical distress.      Resistance Training   Training Prescription Yes    Weight 4 lb    Reps 10-15             Perform Capillary Blood Glucose checks as needed.  Exercise Prescription Changes:   Exercise Prescription Changes     Row Name 05/26/22 1600 06/14/22 1400 06/27/22 1200 07/11/22 1500 07/12/22 0900     Response to Exercise   Blood Pressure (Admit) 140/76 134/68 132/74 130/68 --   Blood Pressure (Exercise) 148/74 152/72 126/64 132/70 --   Blood Pressure (Exit) 134/72 130/64 100/64 108/62 --   Heart  Rate (Admit) 66 bpm 68 bpm 74 bpm 74 bpm --   Heart Rate (Exercise) 94 bpm 96 bpm 97 bpm 101 bpm --   Heart Rate (Exit) 73 bpm 90 bpm 86 bpm 84 bpm --   Oxygen Saturation (Admit) 93 % 93 % 94 % 92 % --   Oxygen Saturation (Exercise) 86 % 88 % 88 % 88 % --   Oxygen Saturation (Exit)  93 % 91 % 93 % 94 % --   Rating of Perceived Exertion (Exercise) 13 15 13 13  --   Perceived Dyspnea (Exercise) 3 3 2 2  --   Symptoms Back pain 9/10, SOB none SOB, back pain SOB --   Comments walk test results First two full days of exercise -- -- --   Duration -- Progress to 30 minutes of  aerobic without signs/symptoms of physical distress Continue with 30 min of aerobic exercise without signs/symptoms of physical distress. Continue with 30 min of aerobic exercise without signs/symptoms of physical distress. --   Intensity -- THRR unchanged THRR unchanged THRR unchanged --     Progression   Progression -- Continue to progress workloads to maintain intensity without signs/symptoms of physical distress. Continue to progress workloads to maintain intensity without signs/symptoms of physical distress. Continue to progress workloads to maintain intensity without signs/symptoms of physical distress. --   Average METs -- 1.94 2.04 2.11 --     Resistance Training   Training Prescription -- Yes Yes Yes --   Weight -- 4 lb 4 lb 6 lb --   Reps -- 10-15 10-15 10-15 --     Interval Training   Interval Training -- No No No --     Oxygen   Oxygen -- Continuous Continuous -- --     Recumbant Bike   Level -- 1 4 2  --   Watts -- 15 20 19  --   Minutes -- 15 15 15  --   METs -- -- 1.87 2.49 --     NuStep   Level -- -- -- 3 --   Minutes -- -- -- 15 --   METs -- -- -- 2 --     T5 Nustep   Level -- 2 3 2  --   Minutes -- 15 15 15  --   METs -- 1.8 2 1.9 --     Biostep-RELP   Level -- 2 2 -- --   Minutes -- 15 15 -- --   METs -- 2 2 -- --     Track   Laps -- 19 22 19  --   Minutes -- 15 15 15  --   METs -- 2.03 2.2  2.03 --     Home Exercise Plan   Plans to continue exercise at -- -- -- -- [employer] (comment)  [employer] and walking   Frequency -- -- -- -- Add 3 additional days to program exercise sessions.   Initial Home Exercises Provided -- -- -- -- 07/12/22     Oxygen   Maintain Oxygen Saturation -- 88% or higher 88% or higher 88% or higher --    Row Name 07/25/22 1100 08/11/22 1200 08/22/22 1600 09/05/22 1400 09/19/22 1600     Response to Exercise   Blood Pressure (Admit) 132/64 116/62 122/68 120/70 114/62   Blood Pressure (Exit) 130/62 110/60 112/58 108/62 114/60   Heart Rate (Admit) 69 bpm 79 bpm 65 bpm 64 bpm 64 bpm   Heart Rate (Exercise) 101 bpm 100 bpm 98 bpm 94 bpm 95 bpm   Heart Rate (Exit) 78 bpm 91 bpm 79 bpm 70 bpm 84 bpm   Oxygen Saturation (Admit) 93 % 92 % 92 % 92 % 95 %   Oxygen Saturation (Exercise) 89 % 85 % 88 % 88 % 89 %   Oxygen Saturation (Exit) 92 % 92 % 94 % 95 % 93 %   Rating of Perceived Exertion (Exercise) 13 13 13 13  13  Perceived Dyspnea (Exercise) 2 2 2 2 2    Symptoms SOB SOB SOB SOB SOB   Duration Continue with 30 min of aerobic exercise without signs/symptoms of physical distress. Continue with 30 min of aerobic exercise without signs/symptoms of physical distress. Continue with 30 min of aerobic exercise without signs/symptoms of physical distress. Continue with 30 min of aerobic exercise without signs/symptoms of physical distress. Continue with 30 min of aerobic exercise without signs/symptoms of physical distress.   Intensity THRR unchanged THRR unchanged THRR unchanged THRR unchanged THRR unchanged     Progression   Progression Continue to progress workloads to maintain intensity without signs/symptoms of physical distress. Continue to progress workloads to maintain intensity without signs/symptoms of physical distress. Continue to progress workloads to maintain intensity without signs/symptoms of physical distress. Continue to progress workloads to  maintain intensity without signs/symptoms of physical distress. Continue to progress workloads to maintain intensity without signs/symptoms of physical distress.   Average METs 2.32 2.08 1.98 2.08 2.24     Resistance Training   Training Prescription Yes Yes Yes Yes Yes   Weight 6 lb 6 lb 6 lb 6 lb 6 lb   Reps 10-15 10-15 10-15 10-15 10-15     Interval Training   Interval Training No No No No No     Treadmill   MPH 1 -- -- -- 1   Grade 0 -- -- -- 0   Minutes 15 -- -- -- 15   METs 1.8 -- -- -- 1.77     Recumbant Bike   Level 3 3 -- -- --   Watts 27 22 -- -- --   Minutes 15 15 -- -- --   METs 2.71 2.57 -- -- --     Arm Ergometer   Level -- 1 1 2 2    Minutes -- 15 15 15 15    METs -- 1 1 2.7 2.3     T5 Nustep   Level 3 3 -- -- --   Minutes 15 15 -- -- --   METs 2 2 -- -- --     Biostep-RELP   Level 3 3 3 3 3    Minutes 15 15 15 15 15    METs 3 -- 2 2 3      Track   Laps 20 26 25 25 27    Minutes 15 15 15 15 15    METs 2.09 2.41 2.36 2.36 2.47     Home Exercise Plan   Plans to continue exercise at [employer] (comment)  [employer] and [facility] [employer] (comment)  [employer] and [facility] [employer] (comment)  [employer] and [facility] [employer] (comment)  [employer] and [facility] [employer] (comment)  [employer] and walking   Frequency Add 3 additional days to program exercise sessions. Add 3 additional days to program exercise sessions. Add 3 additional days to program exercise sessions. Add 3 additional days to program exercise sessions. Add 3 additional days to program exercise sessions.   Initial Home Exercises Provided 07/12/22 07/12/22 07/12/22 07/12/22 07/12/22     Oxygen   Maintain Oxygen Saturation 88% or higher 88% or higher 88% or higher 88% or higher 88% or higher    Row Name 10/05/22 0900             Response to Exercise   Blood Pressure (Admit) 108/64       Blood Pressure (Exit) 124/70       Heart Rate (Admit) 66 bpm       Heart Rate (Exercise)  92  bpm       Heart Rate (Exit) 79 bpm       Oxygen Saturation (Admit) 90 %       Oxygen Saturation (Exercise) 87 %       Oxygen Saturation (Exit) 92 %       Rating of Perceived Exertion (Exercise) 13       Perceived Dyspnea (Exercise) 2       Symptoms SOB       Duration Continue with 30 min of aerobic exercise without signs/symptoms of physical distress.       Intensity THRR unchanged         Progression   Progression Continue to progress workloads to maintain intensity without signs/symptoms of physical distress.       Average METs 2.49         Resistance Training   Training Prescription Yes       Weight 6 lb       Reps 10-15         Interval Training   Interval Training No         NuStep   Level 6       Minutes 15       METs 3         Arm Ergometer   Level 2       Minutes 15       METs 2.5         Biostep-RELP   Level 3       Minutes 30       METs 2         Track   Laps 29       Minutes 15       METs 2.58         Home Exercise Plan   Plans to continue exercise at [employer] (comment)  [employer] and walking       Frequency Add 3 additional days to program exercise sessions.       Initial Home Exercises Provided 07/12/22         Oxygen   Maintain Oxygen Saturation 88% or higher                Exercise Comments:   Exercise Goals and Review:   Exercise Goals     Row Name 05/26/22 1618             Exercise Goals   Increase Physical Activity Yes       Intervention Provide advice, education, support and counseling about physical activity/exercise needs.;Develop an individualized exercise prescription for aerobic and resistive training based on initial evaluation findings, risk stratification, comorbidities and participant's personal goals.       Expected Outcomes Short Term: Attend rehab on a regular basis to increase amount of physical activity.;Long Term: Add in home exercise to make exercise part of routine and to increase amount of physical  activity.;Long Term: Exercising regularly at least 3-5 days a week.       Increase Strength and Stamina Yes       Intervention Provide advice, education, support and counseling about physical activity/exercise needs.;Develop an individualized exercise prescription for aerobic and resistive training based on initial evaluation findings, risk stratification, comorbidities and participant's personal goals.       Expected Outcomes Short Term: Increase workloads from initial exercise prescription for resistance, speed, and METs.;Short Term: Perform resistance training exercises routinely during rehab and add in resistance training at home;Long Term: Improve  cardiorespiratory fitness, muscular endurance and strength as measured by increased METs and functional capacity ( )       Able to understand and use rate of perceived exertion (RPE) scale Yes       Intervention Provide education and explanation on how to use RPE scale       Expected Outcomes Short Term: Able to use RPE daily in rehab to express subjective intensity level;Long Term:  Able to use RPE to guide intensity level when exercising independently       Able to understand and use Dyspnea scale Yes       Intervention Provide education and explanation on how to use Dyspnea scale       Expected Outcomes Short Term: Able to use Dyspnea scale daily in rehab to express subjective sense of shortness of breath during exertion;Long Term: Able to use Dyspnea scale to guide intensity level when exercising independently       Knowledge and understanding of Target Heart Rate Range (THRR) Yes       Intervention Provide education and explanation of THRR including how the numbers were predicted and where they are located for reference       Expected Outcomes Short Term: Able to state/look up THRR;Short Term: Able to use daily as guideline for intensity in rehab;Long Term: Able to use THRR to govern intensity when exercising independently       Able to check  pulse independently Yes       Intervention Provide education and demonstration on how to check pulse in carotid and radial arteries.;Review the importance of being able to check your own pulse for safety during independent exercise       Expected Outcomes Short Term: Able to explain why pulse checking is important during independent exercise;Long Term: Able to check pulse independently and accurately       Understanding of Exercise Prescription Yes       Intervention Provide education, explanation, and written materials on patient's individual exercise prescription       Expected Outcomes Short Term: Able to explain program exercise prescription;Long Term: Able to explain home exercise prescription to exercise independently                Exercise Goals Re-Evaluation :  Exercise Goals Re-Evaluation     Row Name 06/14/22 1505 06/16/22 1002 06/27/22 1213 07/11/22 1523 07/12/22 0952     Exercise Goal Re-Evaluation   Exercise Goals Review Increase Physical Activity;Increase Strength and Stamina;Understanding of Exercise Prescription Increase Physical Activity;Increase Strength and Stamina;Understanding of Exercise Prescription Increase Physical Activity;Increase Strength and Stamina;Understanding of Exercise Prescription Increase Physical Activity;Increase Strength and Stamina;Understanding of Exercise Prescription Increase Physical Activity;Increase Strength and Stamina;Understanding of Exercise Prescription   Comments Levi [name] is off to a good start in the program. He had an average MET level of 1.94 METs during his first two sessions in the program. He also was able to work at level 2 on the biostep and T4 Nustep. He walked up to 19 laps on the track as well. We will continue to monitor his progress in the program. Levi [name] states that he feels he is doing well in the program. He states that he is able to move a lot better since starting the program. This is the patients second time in the program  and he states that he knows the program is beneficial, so he looks forward to the long term benefits of staying consistent with his exercise in the program. Patient states that he  has been doing some resistance training with hand weights at home on his days away from rehab. He was encouraged to begin walking some on his days away from rehab. He stated that he and his wife plan to join the [facility] to begin exercising together. We will continue to monitor his progress in the program. Levi [name] continues to do well in rehab. He increased to level 3 on the T5 Nustep and up to level 4 on the recumbent bike. He is limited with his walking due to chronic back pain, however, he was still able to walk 22 laps on the track, which is his highest yet.  Oxygen saturations are staying 88% or higher. We will continue to monitor. Levi [name] is doing well in rehab. He has continued to work at level 2 on the T5 nustep and recumbent bike, and level 3 on the T4 nustep. He also has continued to walk between 19-22 laps on the track as well. He also increased to 6 lb hand weights for resistance training. We will continue to monitor his progress in the program. Levi [name] is doing well in rehab.  He is walking some on his off days when it is warmer out.  They are planning to join [employer] with [employer].  He said his breathing does better inside than outside especially in the spring.  He is getting more stamina, but wants to lose weight. Reviewed home exercise with pt today.  Pt plans to walk and join [facility] for exercise.  Reviewed THR, pulse, RPE, sign and symptoms, pulse oximetery and when to call 911 or MD.  Also discussed weather considerations and indoor options.  Pt voiced understanding.   Expected Outcomes Short: Continue to follow current exercise prescription. Long: Continue to improve strength and stamina. Short: Begin going to the [employer] to exercise on days away from rehab. Long: Continue to improve strength and stamina. Short: Continue to  increase laps on track as tolerated with back Long: Continue to increase overall MET level and stamina Short: Continue to increase laps on track. Long: Continue to improve strength and stamina. Short: Join [employer]  Long: Exercise on off days consistently    Row Name 07/25/22 1158 07/26/22 1022 08/11/22 1203 08/22/22 1616 08/23/22 1022     Exercise Goal Re-Evaluation   Exercise Goals Review Increase Physical Activity;Increase Strength and Stamina;Understanding of Exercise Prescription Increase Physical Activity;Increase Strength and Stamina;Understanding of Exercise Prescription Increase Physical Activity;Increase Strength and Stamina;Understanding of Exercise Prescription Increase Physical Activity;Increase Strength and Stamina;Understanding of Exercise Prescription Increase Physical Activity;Increase Strength and Stamina;Understanding of Exercise Prescription   Comments Levi [name] continues to do well in rehab. He did increase to 20 laps on the track, which is his highest yet. He also increased to level 3 on the T5 Nustep as well as Biostep. On the recumbent bike, he worked up to 27 watts. His osyxgen levels are staying above 88%. We will continue to monitor. Levi [name] is doing well in rehab.  He is good about doing his hand weights at home.  His wife makes him get up and go for walks.  She will also take him when she goes to store so that he can walk while she does her shopping.  He does feel like his stamina is getting better. Levi [name] is doing well in rehab. He recently increased his laps on the track, walking up to 26 laps on the track. He also has stayed consistent with his workloads on seated machines at level 3 on the biostep, T5 nustep,  and recumbent bike. He also has continued to tolerate 6 lb hand weights for resistance training. We will continue to monitor his progress. Levi [name] continues to do well in rehab. He has been consistent at 20-25 laps on the track, he will take breaks as needed with his oxygen  levels. He has been at level 1 on the arm crank and could benefit from increasing that workload. Will continue to monitor. Levi [name] is doing well in rehab.  He is walking and using equipment and weights at home.  He is feeling a little better and stronger than when he started.  He is still planning to join [employer] with his wife.   Expected Outcomes Short: Continue to increase laps on track Long: Continue to increase overall MET level and stamina Short: Continue to walk on his off days Long: Continue to improve stamina Short: Continue to progressively increase workloads on seated machines. Long: Continue to improve strength and stamina. Short: Increase level on arm crank Long: Continue to increase overall MET level and stamina SHort: Join [employer] before graduating Long: Continue to exercise indpendently    Row Name 09/05/22 1503 09/19/22 1639 09/27/22 1024 10/05/22 0907       Exercise Goal Re-Evaluation   Exercise Goals Review Increase Physical Activity;Increase Strength and Stamina;Understanding of Exercise Prescription Increase Physical Activity;Increase Strength and Stamina;Understanding of Exercise Prescription Increase Physical Activity;Increase Strength and Stamina;Understanding of Exercise Prescription Increase Physical Activity;Increase Strength and Stamina;Understanding of Exercise Prescription    Comments Levi [name] is doing well in rehab. He has continued to work at an average overall MET level above 2 METs. He also was able to improve to level 2 on the arm crank. He has consistently walked up to 25 laps on the track as well. We will continue to monitor his progress in the program. Levi [name] continues to do well in rehab. He is up to 27 laps on the track which is the most he has had yet! He has been doing well on level 2 on the arm crank. He is due for his post and we hope to see significant improvement. Will continue to monitor. Levi [name] is doing well in rehab.  He is nearing graduation and we will do his  on his next visit.  We expect to see an improvement in his distance.  He is not doing much walking at home, but he is using his pedal machine and lifting weights.  They are considering joining the [facility] for free to be able to keep up with his exercise after graduation. Levi [name] is doing well in rehab and is ready to graduate. He completed his post and improved by 23.6%! He also walked up to 29 laps on the track and improved to level 6 on the T4 nustep. He increased his overall average METs as well to 2.49 METs. We will continue to monitor his progress until he graduates from the program.    Expected Outcomes Short: Continue to push for more laps on the track. Long: Continue to improve strength and stamina. Short: Improve on post Long: Continue to increase overall MET level Short; continue to exercise on off days Long; Conitnue to improve stamina Short: Graduate. Long: Continue to exercise independently.             Discharge Exercise Prescription (Final Exercise Prescription Changes):  Exercise Prescription Changes - 10/05/22 0900       Response to Exercise   Blood Pressure (Admit) 108/64    Blood Pressure (Exit) 124/70  Heart Rate (Admit) 66 bpm    Heart Rate (Exercise) 92 bpm    Heart Rate (Exit) 79 bpm    Oxygen Saturation (Admit) 90 %    Oxygen Saturation (Exercise) 87 %    Oxygen Saturation (Exit) 92 %    Rating of Perceived Exertion (Exercise) 13    Perceived Dyspnea (Exercise) 2    Symptoms SOB    Duration Continue with 30 min of aerobic exercise without signs/symptoms of physical distress.    Intensity THRR unchanged      Progression   Progression Continue to progress workloads to maintain intensity without signs/symptoms of physical distress.    Average METs 2.49      Resistance Training   Training Prescription Yes    Weight 6 lb    Reps 10-15      Interval Training   Interval Training No      NuStep   Level 6    Minutes 15    METs 3      Arm  Ergometer   Level 2    Minutes 15    METs 2.5      Biostep-RELP   Level 3    Minutes 30    METs 2      Track   Laps 29    Minutes 15    METs 2.58      Home Exercise Plan   Plans to continue exercise at [employer] (comment)   [employer] and walking   Frequency Add 3 additional days to program exercise sessions.    Initial Home Exercises Provided 07/12/22      Oxygen   Maintain Oxygen Saturation 88% or higher             Nutrition:  Target Goals: Understanding of nutrition guidelines, daily intake of sodium 1500mg , cholesterol 200mg , calories 30% from fat and 7% or less from saturated fats, daily to have 5 or more servings of fruits and vegetables.  Education: All About Nutrition: -Group instruction provided by verbal, written material, interactive activities, discussions, models, and posters to present general guidelines for heart healthy nutrition including fat, fiber, MyPlate, the role of sodium in heart healthy nutrition, utilization of the nutrition label, and utilization of this knowledge for meal planning. Follow up email sent as well. Written material given at graduation. Flowsheet Row Pulmonary Rehab from 10/08/2020 in [facility] Cardiac and Pulmonary Rehab  Date 09/10/20  Educator [facility]  Instruction Review Code 1- Verbalizes Understanding       Biometrics:  Pre Biometrics - 05/26/22 1553       Pre Biometrics   Height 5\' 11"  (1.803 m)    Weight 270 lb 8 oz (122.7 kg)    Waist Circumference 55 inches    Hip Circumference 48.5 inches    Waist to Hip Ratio 1.13 %    BMI (Calculated) 37.74    Single Leg Stand 3.2 seconds             Post Biometrics - 09/29/22 1004        Post  Biometrics   Height 5\' 11"  (1.803 m)    Weight 268 lb 14.4 oz (122 kg)    Waist Circumference 49.5 inches    Hip Circumference 48.5 inches    Waist to Hip Ratio 1.02 %    BMI (Calculated) 37.52    Single Leg Stand 8.6 seconds             Nutrition Therapy Plan and  Nutrition Goals:  Nutrition Therapy & Goals - 05/26/22 1547       Nutrition Therapy   RD appointment deferred Yes   Deferred     Intervention Plan   Intervention Prescribe, educate and counsel regarding individualized specific dietary modifications aiming towards targeted core components such as weight, hypertension, lipid management, diabetes, heart failure and other comorbidities.    Expected Outcomes Short Term Goal: Understand basic principles of dietary content, such as calories, fat, sodium, cholesterol and nutrients.;Short Term Goal: A plan has been developed with personal nutrition goals set during dietitian appointment.;Long Term Goal: Adherence to prescribed nutrition plan.             Nutrition Assessments:  MEDIFICTS Score Key: ?70 Need to make dietary changes  40-70 Heart Healthy Diet ? 40 Therapeutic Level Cholesterol Diet  Flowsheet Row Documentation from 10/10/2022 in [facility] Cardiac and Pulmonary Rehab  Picture Your Plate Total Score on Admission 59  Picture Your Plate Total Score on Discharge 63      Picture Your Plate Scores: <16 Unhealthy dietary pattern with much room for improvement. 41-50 Dietary pattern unlikely to meet recommendations for good health and room for improvement. 51-60 More healthful dietary pattern, with some room for improvement.  >60 Healthy dietary pattern, although there may be some specific behaviors that could be improved.   Nutrition Goals Re-Evaluation:  Nutrition Goals Re-Evaluation     Row Name 06/16/22 1014 07/12/22 0958 07/26/22 1027 08/23/22 1024 09/27/22 1027     Goals   Nutrition Goal -- Continue to work on healthy eating. Short: Work on portion control Long: Continue to eat heart healthy Short: Continue to work on portion control Long: conitnue to work on Armed forces training and education officer; Continue to work on portions Long: Conitnue to stay healthy   Comment Levi [name] denies wanting to meet with the RD at this time. However, he states that  his wife has helped him work on healthy eating. They attended the nutrition education classes the last time he was in the program and have implemented the things that they learned. Levi [name] continues to [employer].  His wife makes sure that he eats good but he admits to over eating on occasion and is trying to cut back.  His wife usually cooks at home and tries to get a good variety.  He needs to work on portion control. Levi [name] is doing well in rehab.  He is watching his diet and his wife stays on him about it.  He has backed off of added sugars and sweets.  He would like to lose weight.  He is still working on portion sizes and his wife has really cut him back to help. Levi [name] is doing well in rehab.  He continues to work on portion control.  His wife is staying on top of him and watching what he eats.  He is limiting sugars and salt still. Levi [name] is doing well in rehab. His wife has cut back on his portions and getting his diet under better control. He is trying to get a good variety.   Expected Outcome Continue to work on healthy eating. Short: Work on portion control Long: Continue to eat heart healthy Short: Continue to work on portion control Long: conitnue to work on Armed forces training and education officer; Continue to work on portions Long: Conitnue to stay healthy Short; Continue to work on portions Long: Conitnue to stay healthy            Nutrition Goals Discharge (Final Nutrition Goals Re-Evaluation):  Nutrition Goals Re-Evaluation - 09/27/22 1027       Goals   Nutrition Goal Short; Continue to work on portions Long: Conitnue to stay healthy    Comment Levi [name] is doing well in rehab. His wife has cut back on his portions and getting his diet under better control. He is trying to get a good variety.    Expected Outcome Short; Continue to work on portions Long: Conitnue to stay healthy             Psychosocial: Target Goals: Acknowledge presence or absence of significant depression and/or  stress, maximize coping skills, provide positive support system. Participant is able to verbalize types and ability to use techniques and skills needed for reducing stress and depression.   Education: Stress, Anxiety, and Depression - Group verbal and visual presentation to define topics covered.  Reviews how body is impacted by stress, anxiety, and depression.  Also discusses healthy ways to reduce stress and to treat/manage anxiety and depression.  Written material given at graduation. Flowsheet Row Pulmonary Rehab from 10/08/2020 in [facility] Cardiac and Pulmonary Rehab  Date 10/08/20  Educator [facility]  Instruction Review Code 1- [employer] Understanding       Education: Sleep Hygiene -Provides group verbal and written instruction about how sleep can affect your health.  Define sleep hygiene, discuss sleep cycles and impact of sleep habits. Review good sleep hygiene tips.    Initial Review & Psychosocial Screening:  Initial Psych Review & Screening - 05/17/22 1410       Initial Review   Current issues with Current Stress Concerns    Source of Stress Concerns Unable to participate in former interests or hobbies;Unable to perform yard/household activities;Chronic Illness      Family Dynamics   Good Support System? Yes   wife     Barriers   Psychosocial barriers to participate in program There are no identifiable barriers or psychosocial needs.;The patient should benefit from training in stress management and relaxation.      Screening Interventions   Interventions Encouraged to exercise;Provide feedback about the scores to participant;To provide support and resources with identified psychosocial needs    Expected Outcomes Short Term goal: Utilizing psychosocial counselor, staff and physician to assist with identification of specific Stressors or current issues interfering with healing process. Setting desired goal for each stressor or current issue identified.;Long Term Goal: Stressors or  current issues are controlled or eliminated.;Short Term goal: Identification and review with participant of any Quality of Life or Depression concerns found by scoring the questionnaire.;Long Term goal: The participant improves quality of Life and PHQ9 Scores as seen by post scores and/or verbalization of changes             Quality of Life Scores:  Scores of 19 and below usually indicate a poorer quality of life in these areas.  A difference of  2-3 points is a clinically meaningful difference.  A difference of 2-3 points in the total score of the Quality of Life Index has been associated with significant improvement in overall quality of life, self-image, physical symptoms, and general health in studies assessing change in quality of life.  PHQ-9: Review Flowsheet  More data exists      10/10/2022 05/26/2022 01/26/2021 08/31/2020 10/20/2015  Depression screen PHQ 2/9  Decreased Interest 2 1 1 1  0  Down, Depressed, Hopeless 1 0 0 0 0  PHQ - 2 Score 3 1 1 1  0  Altered sleeping 1 0 1 0 -  Tired, decreased energy 2 3 2 1  -  Change in appetite 1 0 2 0 -  Feeling bad or failure about yourself  0 0 0 0 -  Trouble concentrating 0 0 0 0 -  Moving slowly or fidgety/restless 0 0 0 0 -  Suicidal thoughts 0 0 0 0 -  PHQ-9 Score 7 4 6 2  -  Difficult doing work/chores Somewhat difficult Not difficult at all Somewhat difficult Not difficult at all -   Interpretation of Total Score  Total Score Depression Severity:  1-4 = Minimal depression, 5-9 = Mild depression, 10-14 = Moderate depression, 15-19 = Moderately severe depression, 20-27 = Severe depression   Psychosocial Evaluation and Intervention:  Psychosocial Evaluation - 05/17/22 1416       Psychosocial Evaluation & Interventions   Interventions Encouraged to exercise with the program and follow exercise prescription    Comments 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 He has done the program before and is familiar with  what to expect. 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 His wife is his main support system and is very involved in his health care. She states that his doctor wants him to lose weight and thinks that will help with his breathing and afib. She is concerned about his stamina. He mentioned she probably knows more about his health than he does. He does want to increase his stamina and feel stronger while improving his breathing. His health journey has been rough these last few years and he wants to focus on feeling better.    Expected Outcomes Short: attend pulmonary rehab for education and exercise. Long: Develop and maintain positive self care habits.    Continue Psychosocial Services  Follow up required by staff             Psychosocial Re-Evaluation:  Psychosocial Re-Evaluation     Row Name 06/16/22 1010 07/12/22 0956 07/26/22 1025 08/23/22 1025 09/27/22 1026     Psychosocial Re-Evaluation   Current issues with Current Stress Concerns Current Stress Concerns Current Stress Concerns Current Stress Concerns Current Stress Concerns   Comments Levi [name] is doing well mentally. He denies any depression or anxiety at this time. He does state that he experiences some stress due to his health concerns. He does not like being unable to do the things he wants to do. He reports sleeping well at this time. He reports having a good support system at home made up by his whole family. He does state that coming to rehab has helped his mental state because he knows he is working towards getting better. Levi [name] i s doing well in rehab.  He denies any major stressors or symptoms of depression. His health is his biggest stress.  He has also gotten frustrated over his hearing aids not working right and trying to get back into [place] to be seen.  He continues to have a good support system at home. He sleeps well and says sometimes he sleeps too much when he naps in his chair. Levi [name] is doing  well in rehab.  His hearing is still one of his biggest issues. His hearing aids were unprogrammed and went to [place] yesterday and hasn't really noticed much difference.  His wife is his biggest supporter.  He tries not to let anything get to him too much.  He continues to sleep well. Levi [name] is doing well in [place]. He is still good mentally and happy.  His hearing  and breathing continue to be his biggest limitations.  He continues to sleep well. Levi [name] is doing well in rehab.  He is feeling good mentally.  He has enjoyed rehab and is back to moving more and moving better. He is still struggling with [facility] over his hearing aids.   Expected Outcomes Short: Continue to exercise for mental boost. Long: Maintain positive outlook. Short: Exercise more for mental boost Long: Continue to stay positive Short: Continue to get used to hearing aids Long: conitnue to stay positive Short: Conitnue to work on breathing Long: Continue to stay positive Short: COntinue to work on getting hearing aids Long: conitnue to focus on positive   Interventions Encouraged to attend Pulmonary Rehabilitation for the exercise Encouraged to attend Pulmonary Rehabilitation for the exercise Encouraged to attend Pulmonary Rehabilitation for the exercise -- Encouraged to attend Pulmonary Rehabilitation for the exercise   Continue Psychosocial Services  Follow up required by staff Follow up required by staff Follow up required by staff -- --     Initial Review   Source of Stress Concerns Unable to participate in former interests or hobbies;Unable to perform yard/household activities;Chronic Illness -- -- -- --            Psychosocial Discharge (Final Psychosocial Re-Evaluation):  Psychosocial Re-Evaluation - 09/27/22 1026       Psychosocial Re-Evaluation   Current issues with Current Stress Concerns    Comments Jahshua is doing well in rehab.  He is feeling good mentally.  He has enjoyed rehab and is back to moving more and moving  better. He is still struggling with [facility] over his hearing aids.    Expected Outcomes Short: COntinue to work on getting hearing aids Long: conitnue to focus on positive    Interventions Encouraged to attend Pulmonary Rehabilitation for the exercise             Education: Education Goals: Education classes will be provided on a weekly basis, covering required topics. Participant will state understanding/return demonstration of topics presented.  Learning Barriers/Preferences:  Learning Barriers/Preferences - 05/17/22 1408       Learning Barriers/Preferences   Learning Barriers Hearing    Learning Preferences None             General Pulmonary Education Topics:  Infection Prevention: - Provides verbal and written material to individual with discussion of infection control including proper hand washing and proper equipment cleaning during exercise session. Flowsheet Row Pulmonary Rehab from 09/01/2022 in [facility] Cardiac and Pulmonary Rehab  Education need identified 05/26/22  Date 05/26/22  Educator KW  Instruction Review Code 1- Verbalizes Understanding       Falls Prevention: - Provides verbal and written material to individual with discussion of falls prevention and safety. Flowsheet Row Pulmonary Rehab from 09/01/2022 in [facility] Cardiac and Pulmonary Rehab  Education need identified 05/26/22  Date 05/26/22  Educator KW  Instruction Review Code 1- Verbalizes Understanding       Chronic Lung Disease Review: - Group verbal instruction with posters, models, PowerPoint presentations and videos,  to review new updates, new respiratory medications, new advancements in procedures and treatments. Providing information on websites and "800" numbers for continued self-education. Includes information about supplement oxygen, available portable oxygen systems, continuous and intermittent flow rates, oxygen safety, concentrators, and Medicare reimbursement for oxygen. Explanation of  Pulmonary Drugs, including class, frequency, complications, importance of spacers, rinsing mouth after steroid MDI's, and proper cleaning methods for nebulizers. Review of basic lung anatomy and physiology related to  function, structure, and complications of lung disease. Review of risk factors. Discussion about methods for diagnosing sleep apnea and types of masks and machines for OSA. Includes a review of the use of types of environmental controls: home humidity, furnaces, filters, dust mite/pet prevention, HEPA vacuums. Discussion about weather changes, air quality and the benefits of nasal washing. Instruction on Warning signs, infection symptoms, calling MD promptly, preventive modes, and value of vaccinations. Review of effective airway clearance, coughing and/or vibration techniques. Emphasizing that all should Create an Action Plan. Written material given at graduation. Flowsheet Row Pulmonary Rehab from 09/01/2022 in [facility] Cardiac and Pulmonary Rehab  Education need identified 05/26/22       AED/CPR: - Group verbal and written instruction with the use of models to demonstrate the basic use of the AED with the basic ABC's of resuscitation.    Anatomy and Cardiac Procedures: - Group verbal and visual presentation and models provide information about basic cardiac anatomy and function. Reviews the testing methods done to diagnose heart disease and the outcomes of the test results. Describes the treatment choices: Medical Management, Angioplasty, or Coronary Bypass Surgery for treating various heart conditions including Myocardial Infarction, Angina, Valve Disease, and Cardiac Arrhythmias.  Written material given at graduation.   Medication Safety: - Group verbal and visual instruction to review commonly prescribed medications for heart and lung disease. Reviews the medication, class of the drug, and side effects. Includes the steps to properly store meds and maintain the prescription regimen.   Written material given at graduation. Flowsheet Row Pulmonary Rehab from 10/08/2020 in [facility] Cardiac and Pulmonary Rehab  Date 09/17/20  Educator SB  Instruction Review Code 1- Verbalizes Understanding       Other: -Provides group and verbal instruction on various topics (see comments)   Knowledge Questionnaire Score:  Knowledge Questionnaire Score - 05/26/22 1536       Knowledge Questionnaire Score   Pre Score 17/18              Core Components/Risk Factors/Patient Goals at Admission:  Personal Goals and Risk Factors at Admission - 05/26/22 1618       Core Components/Risk Factors/Patient Goals on Admission    Weight Management Yes;Weight Loss;Obesity    Intervention Weight Management: Develop a combined nutrition and exercise program designed to reach desired caloric intake, while maintaining appropriate intake of nutrient and fiber, sodium and fats, and appropriate energy expenditure required for the weight goal.;Weight Management: Provide education and appropriate resources to help participant work on and attain dietary goals.;Weight Management/Obesity: Establish reasonable short term and long term weight goals.;Obesity: Provide education and appropriate resources to help participant work on and attain dietary goals.    Admit Weight 270 lb (122.5 kg)    Goal Weight: Short Term 265 lb (120.2 kg)    Goal Weight: Long Term 230 lb (104.3 kg)    Expected Outcomes Short Term: Continue to assess and modify interventions until short term weight is achieved;Long Term: Adherence to nutrition and physical activity/exercise program aimed toward attainment of established weight goal;Weight Loss: Understanding of general recommendations for a balanced deficit meal plan, which promotes 1-2 lb weight loss per week and includes a negative energy balance of [id number] kcal/d;Understanding recommendations for meals to include 15-35% energy as protein, 25-35% energy from fat, 35-60% energy from  carbohydrates, less than 200mg  of dietary cholesterol, 20-35 gm of total fiber daily;Understanding of distribution of calorie intake throughout the day with the consumption of 4-5 meals/snacks    Improve shortness  of breath with ADL's Yes    Intervention Provide education, individualized exercise plan and daily activity instruction to help decrease symptoms of SOB with activities of daily living.    Expected Outcomes Short Term: Improve cardiorespiratory fitness to achieve a reduction of symptoms when performing ADLs;Long Term: Be able to perform more ADLs without symptoms or delay the onset of symptoms    Diabetes Yes    Intervention Provide education about signs/symptoms and action to take for hypo/hyperglycemia.;Provide education about proper nutrition, including hydration, and aerobic/resistive exercise prescription along with prescribed medications to achieve blood glucose in normal ranges: Fasting glucose 65-99 mg/dL    Expected Outcomes Short Term: Participant verbalizes understanding of the signs/symptoms and immediate care of hyper/hypoglycemia, proper foot care and importance of medication, aerobic/resistive exercise and nutrition plan for blood glucose control.;Long Term: Attainment of HbA1C < 7%.    Hypertension Yes    Intervention Provide education on lifestyle modifcations including regular physical activity/exercise, weight management, moderate sodium restriction and increased consumption of fresh fruit, vegetables, and low fat dairy, alcohol moderation, and smoking cessation.;Monitor prescription use compliance.    Expected Outcomes Short Term: Continued assessment and intervention until BP is < 140/58mm HG in hypertensive participants. < 130/37mm HG in hypertensive participants with diabetes, heart failure or chronic kidney disease.;Long Term: Maintenance of blood pressure at goal levels.             Education:Diabetes - Individual verbal and written instruction to review  signs/symptoms of diabetes, desired ranges of glucose level fasting, after meals and with exercise. Acknowledge that pre and post exercise glucose checks will be done for 3 sessions at entry of program. Flowsheet Row Pulmonary Rehab from 05/17/2022 in [facility] Cardiac and Pulmonary Rehab  Date 05/17/22  Educator [employer]  Instruction Review Code 1- Verbalizes Understanding       Know Your Numbers and Heart Failure: - Group verbal and visual instruction to discuss disease risk factors for cardiac and pulmonary disease and treatment options.  Reviews associated critical values for Overweight/Obesity, Hypertension, Cholesterol, and Diabetes.  Discusses basics of heart failure: signs/symptoms and treatments.  Introduces Heart Failure Zone chart for action plan for heart failure.  Written material given at graduation. Flowsheet Row Pulmonary Rehab from 10/08/2020 in [facility] Cardiac and Pulmonary Rehab  Date 09/24/20  Educator SB  Instruction Review Code 1- Verbalizes Understanding       Core Components/Risk Factors/Patient Goals Review:   Goals and Risk Factor Review     Row Name 06/16/22 1016 07/12/22 1001 07/26/22 1029 08/23/22 1022 09/27/22 1029     Core Components/Risk Factors/Patient Goals Review   Personal Goals Review Weight Management/Obesity;Diabetes;Hypertension Weight Management/Obesity;Diabetes;Hypertension;Improve shortness of breath with ADL's;Increase knowledge of respiratory medications and ability to use respiratory devices properly. Weight Management/Obesity;Diabetes;Hypertension;Improve shortness of breath with ADL's;Increase knowledge of respiratory medications and ability to use respiratory devices properly. Weight Management/Obesity;Diabetes;Hypertension;Improve shortness of breath with ADL's;Increase knowledge of respiratory medications and ability to use respiratory devices properly. Weight Management/Obesity;Diabetes;Hypertension;Improve shortness of breath with ADL's;Increase knowledge  of respiratory medications and ability to use respiratory devices properly.   Review Eliecer states that he would like to lose a little weight. He is currently at 267.1 lbs but would like to get down to around 230 lbs. Leonte has been checking his blood sugars at home around once a week and states that they have been within normal ranges. He also reports checking his BP at home and states that it has been within normal ranges as well. Kaesen's weight  has started to creep back up again.  He wants to be more active to help with weight loss but he gets SOB quickly from his afib.  They wants him to try a new medication but it would require him to be in hospital to start and if he misses a dose he would be back in hospital.  He does not want that kind of stress and is trying to learn to live with it.  He is doing well with his inhaler and medications.  He does have oxygen to use at home as needed, but he has not needed it very often.  His pressures have been good and blood sugars have been good too. Cayde continues to work on his weight loss.  He is doing better with his diet but he now knows that he needs to move more.  He and his wife are planning to join [facility] after graduation to keep exercising.  His breathing is getting better, but he still gets SOB, which is his biggest limitations.  His sugars are doing well as are pressures.  He continues to use his inhaler  and seems to work well for him. He is also using his nebulizer when he has bad breathing days.  He does have oxygen for prn when he gets really SOB. Gladwin is doing well in rehab. He is frustrated that he is not losing weight.  His breathing is getting better and his pressures are doing well.  He still has SOB but doing well with manageing it.  His inhalers are doing well. His sugars are doing well. Marinus is doing well in rehab. He is nearing graduation.  His weight has come down some with portion control.  He is still getting winded but now thinks it  is related to his afib as well as lungs. His wife is staying on top of his pressures and sugars and they check them daily.  He is doing well on his medications.   Expected Outcomes Short: Continue to work towards weight goal. Long: Continue to monitor lifestyle risk factors. Short: Conitnue to work on weight loss by moving more Long: Continue to monitor risk factors Short: conitnue to work on moving more to help with weight loss Long: Continue to monitor risk factors SHort; Conitnue to work on weight loss with portion control Long: Conitnue to monitor risk factors Short: Continue to work on weight loss and breathing Long: continue to montior risk factors            Core Components/Risk Factors/Patient Goals at Discharge (Final Review):   Goals and Risk Factor Review - 09/27/22 1029       Core Components/Risk Factors/Patient Goals Review   Personal Goals Review Weight Management/Obesity;Diabetes;Hypertension;Improve shortness of breath with ADL's;Increase knowledge of respiratory medications and ability to use respiratory devices properly.    Review Ahzir is doing well in rehab. He is nearing graduation.  His weight has come down some with portion control.  He is still getting winded but now thinks it is related to his afib as well as lungs. His wife is staying on top of his pressures and sugars and they check them daily.  He is doing well on his medications.    Expected Outcomes Short: Continue to work on weight loss and breathing Long: continue to montior risk factors             ITP Comments:  ITP Comments     Row Name 05/17/22 1417 05/26/22 1611 06/22/22 1253 07/20/22  1610 08/17/22 0909   ITP Comments Initial phone call completed. Diagnosis can be found in [facility] 1/11. EP Orientation scheduled for Thursday 1/18 at 2pm. Completed and gym orientation. Initial ITP created and sent for review to Dr. Vida Rigger, , Medical Director. 30 day review completed. ITP sent to Dr. Jinny Sanders, Medical Director of  Pulmonary Rehab. Continue with ITP unless changes are made by physician. 30 Day review completed. Medical Director ITP review done, changes made as directed, and signed approval by Medical Director. 30 day review completed. ITP sent to Dr. Jinny Sanders, Medical Director of  Pulmonary Rehab. Continue with ITP unless changes are made by physician.    Row Name 09/14/22 [phone] 10/12/22 0921         ITP Comments 30 Day review completed. Medical Director ITP review done, changes made as directed, and signed approval by Medical Director. 30 Day review completed. Medical Director ITP review done, changes made as directed, and signed approval by Medical Director.               Comments:

## 2022-10-13 ENCOUNTER — Encounter: Payer: Medicare Other | Admitting: *Deleted

## 2022-10-13 DIAGNOSIS — J439 Emphysema, unspecified: Secondary | ICD-10-CM

## 2022-10-13 NOTE — Progress Notes (Signed)
Pulmonary Individual Treatment Plan  Patient Details  Name: FINNIS SWALLEY MRN: [id number] Date of Birth: 04/08/1941 Referring Provider:   Doristine Devoid Pulmonary Rehab from 05/26/2022 in [facility] Cardiac and Pulmonary Rehab  Referring Provider [name] MD       Initial Encounter Date:  Flowsheet Row Pulmonary Rehab from 05/26/2022 in [facility] Cardiac and Pulmonary Rehab  Date 05/26/22       Visit Diagnosis: Pulmonary emphysema, unspecified emphysema type (HCC)  Patient's Home Medications on Admission:  Current Outpatient Medications:    albuterol (PROVENTIL HFA;VENTOLIN HFA) 108 (90 Base) MCG/ACT inhaler, Inhale 2 puffs into the lungs every 6 (six) hours as needed for wheezing or shortness of breath., Disp: 1 Inhaler, Rfl: 0   apixaban (ELIQUIS) 5 MG TABS tablet, Take 5 mg by mouth 2 (two) times daily., Disp: , Rfl:    carboxymethylcellulose (REFRESH PLUS) 0.5 % SOLN, 1 drop 4 (four) times daily as needed., Disp: , Rfl:    finasteride (PROSCAR) 5 MG tablet, Take 5 mg by mouth daily., Disp: , Rfl:    fluticasone-salmeterol (ADVAIR) 250-50 MCG/ACT AEPB, Inhale 1 puff into the lungs in the morning and at bedtime., Disp: , Rfl:    levothyroxine (SYNTHROID) 200 MCG tablet, Take 200 mcg by mouth daily before breakfast., Disp: , Rfl:    SEMAGLUTIDE,0.25 OR 0.5MG /DOS, , Inject 0.5 mg into the skin once a week., Disp: , Rfl:    tamsulosin (FLOMAX) 0.4 MG CAPS capsule, 0.8 mg daily., Disp: , Rfl:    Tiotropium Bromide Monohydrate (SPIRIVA RESPIMAT) 2.5 MCG/ACT AERS, Inhale 2 puffs into the lungs daily., Disp: 1 each, Rfl: 11   torsemide (DEMADEX) 20 MG tablet, Take 1 tablet (20 mg total) by mouth every other day., Disp: 45 tablet, Rfl: 1   vitamin B-12 (CYANOCOBALAMIN) 1000 MCG tablet, Take 1,000 mcg by mouth daily., Disp: , Rfl:   Past Medical History: Past Medical History:  Diagnosis Date   Asthma    COPD (chronic obstructive pulmonary disease) (HCC)    Hearing loss    Hypertension     Hypothyroidism    Mass of left lung    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HCC)    Prostate enlargement    Shortness of breath    Squamous cell carcinoma of lung, left (HCC)    Thyroid disease    Tobacco abuse     Tobacco Use: Social History   Tobacco Use  Smoking Status Former   Packs/day: 0.25   Years: 62.00   Additional pack years: 0.00   Total pack years: 15.50   Types: Cigarettes   Quit date: 09/05/2018   Years since quitting: 4.1  Smokeless Tobacco Never    Labs: Review Flowsheet       Latest Ref Rng & Units 10/15/2018 03/09/2021  Labs for ITP Cardiac and Pulmonary Rehab  Hemoglobin A1c 4.8 - 5.6 % - 7.8   PH, Arterial 7.350 - 7.450 7.42  -  PCO2 arterial 32.0 - 48.0 mmHg 35  -  Bicarbonate 20.0 - 28.0 mmol/L 22.7  -  Acid-base deficit 0.0 - 2.0 mmol/L 1.3  -  O2 Saturation % 93.1  -     Pulmonary Assessment Scores:  Pulmonary Assessment Scores     Row Name 05/26/22 1539 10/10/22 1043       ADL UCSD   ADL Phase Entry Exit    SOB Score total 77 71    Rest 0 2    Walk 3 2    Stairs  4 3    Bath 3 3    Dress 3 2    Shop 3 3      CAT Score   CAT Score 21 21      mMRC Score   mMRC Score 2 --             UCSD: Self-administered rating of dyspnea associated with activities of daily living (ADLs) 6-point scale (0 = "not at all" to 5 = "maximal or unable to do because of breathlessness")  Scoring Scores range from 0 to 120.  Minimally important difference is 5 units  CAT: CAT can identify the health impairment of COPD patients and is better correlated with disease progression.  CAT has a scoring range of zero to 40. The CAT score is classified into four groups of low (less than 10), medium (10 - 20), high (21-30) and very high (31-40) based on the impact level of disease on health status. A CAT score over 10 suggests significant symptoms.  A worsening CAT score could be explained by an exacerbation, poor medication adherence, poor inhaler technique, or  progression of COPD or comorbid conditions.  CAT MCID is 2 points  mMRC: mMRC (Modified Medical Research Council) Dyspnea Scale is used to assess the degree of baseline functional disability in patients of respiratory disease due to dyspnea. No minimal important difference is established. A decrease in score of 1 point or greater is considered a positive change.   Pulmonary Function Assessment:   Exercise Target Goals: Exercise Program Goal: Individual exercise prescription set using results from initial 6 min walk test and THRR while considering  patient's activity barriers and safety.   Exercise Prescription Goal: Initial exercise prescription builds to 30-45 minutes a day of aerobic activity, 2-3 days per week.  Home exercise guidelines will be given to patient during program as part of exercise prescription that the participant will acknowledge.  Education: Aerobic Exercise: - Group verbal and visual presentation on the components of exercise prescription. Introduces F.I.T.T principle from ACSM for exercise prescriptions.  Reviews F.I.T.T. principles of aerobic exercise including progression. Written material given at graduation.   Education: Resistance Exercise: - Group verbal and visual presentation on the components of exercise prescription. Introduces F.I.T.T principle from ACSM for exercise prescriptions  Reviews F.I.T.T. principles of resistance exercise including progression. Written material given at graduation.    Education: Exercise & Equipment Safety: - Individual verbal instruction and demonstration of equipment use and safety with use of the equipment. Flowsheet Row Pulmonary Rehab from 09/01/2022 in [facility] Cardiac and Pulmonary Rehab  Education need identified 05/26/22  Date 05/26/22  Educator KW  Instruction Review Code 1- Verbalizes Understanding       Education: Exercise Physiology & General Exercise Guidelines: - Group verbal and written instruction with models  to review the exercise physiology of the cardiovascular system and associated critical values. Provides general exercise guidelines with specific guidelines to those with heart or lung disease.    Education: Flexibility, Balance, Mind/Body Relaxation: - Group verbal and visual presentation with interactive activity on the components of exercise prescription. Introduces F.I.T.T principle from ACSM for exercise prescriptions. Reviews F.I.T.T. principles of flexibility and balance exercise training including progression. Also discusses the mind body connection.  Reviews various relaxation techniques to help reduce and manage stress (i.e. Deep breathing, progressive muscle relaxation, and visualization). Balance handout provided to take home. Written material given at graduation. Flowsheet Row Pulmonary Rehab from 10/08/2020 in [facility] Cardiac and Pulmonary Rehab  Date 09/03/20  Educator AS  Instruction Review Code 1- Verbalizes Understanding       Activity Barriers & Risk Stratification:  Activity Barriers & Cardiac Risk Stratification - 05/26/22 1552       Activity Barriers & Cardiac Risk Stratification   Activity Barriers Deconditioning;Shortness of Breath;Muscular Weakness;Back Problems    Comments Sciatica             6 Minute Walk:  6 Minute Walk     Row Name 05/26/22 1554 09/29/22 1003       6 Minute Walk   Phase Initial Discharge    Distance 890 feet 1100 feet    Distance % Change -- 23.6 %    Distance Feet Change -- 210 ft    Walk Time 6 minutes 6 minutes    # of Rest Breaks 0 0    MPH 1.68 2.08    METS 1.15 1.81    RPE 13 13    Perceived Dyspnea  3 3    VO2 Peak 4.03 6.33    Symptoms Yes (comment) Yes (comment)    Comments Back pain caused from sciatica 9/10, SOB back pain 9/10, SOB    Resting HR 66 bpm 66 bpm    Resting BP 140/76 108/64    Resting Oxygen Saturation  93 % 90 %    Exercise Oxygen Saturation  during 6 min walk 86 % 84 %    Max Ex. HR 94 bpm 112 bpm     Max Ex. BP 148/74 164/86    2 Minute Post BP 136/76 134/70      Interval HR   1 Minute HR 81 --    2 Minute HR 91 --    3 Minute HR 94 --    4 Minute HR 94 --    5 Minute HR 91 --    6 Minute HR 90 --    2 Minute Post HR 93 --    Interval Heart Rate? Yes --      Interval Oxygen   Interval Oxygen? Yes --    Baseline Oxygen Saturation % 93 % --    1 Minute Oxygen Saturation % 91 % --    1 Minute Liters of Oxygen 0 L  RA --    2 Minute Oxygen Saturation % 88 % --    2 Minute Liters of Oxygen 0 L --    3 Minute Oxygen Saturation % 87 % --    3 Minute Liters of Oxygen 0 L --    4 Minute Oxygen Saturation % 88 % --    4 Minute Liters of Oxygen 0 L --    5 Minute Oxygen Saturation % 87 % --    5 Minute Liters of Oxygen 0 L --    6 Minute Oxygen Saturation % 86 % --    6 Minute Liters of Oxygen 0 L --    2 Minute Post Oxygen Saturation % 93 % --    2 Minute Post Liters of Oxygen 0 L --            Oxygen Initial Assessment:  Oxygen Initial Assessment - 05/26/22 1538       Home Oxygen   Home Oxygen Device None   prn only- does not use currently   Sleep Oxygen Prescription None    Home Exercise Oxygen Prescription None    Home Resting Oxygen Prescription None    Compliance with Home Oxygen Use Yes   prn  Initial 6 min Walk   Oxygen Used None      Program Oxygen Prescription   Program Oxygen Prescription None   prn     Intervention   Short Term Goals To learn and understand importance of monitoring SPO2 with pulse oximeter and demonstrate accurate use of the pulse oximeter.;To learn and understand importance of maintaining oxygen saturations>88%;To learn and demonstrate proper pursed lip breathing techniques or other breathing techniques. ;To learn and demonstrate proper use of respiratory medications;To learn and exhibit compliance with exercise, home and travel O2 prescription    Long  Term Goals Maintenance of O2 saturations>88%;Exhibits proper breathing  techniques, such as pursed lip breathing or other method taught during program session;Exhibits compliance with exercise, home  and travel O2 prescription;Compliance with respiratory medication;Demonstrates proper use of MDI's;Verbalizes importance of monitoring SPO2 with pulse oximeter and return demonstration             Oxygen Re-Evaluation:  Oxygen Re-Evaluation     Row Name 06/16/22 1024 07/12/22 1004 07/26/22 1031 08/23/22 1026 09/27/22 1030     Program Oxygen Prescription   Program Oxygen Prescription None None None None None     Home Oxygen   Home Oxygen Device None None None None None   Sleep Oxygen Prescription None None None None None   Home Exercise Oxygen Prescription None None None None None   Home Resting Oxygen Prescription None None None None None   Compliance with Home Oxygen Use Yes Yes  uses as needed at home -- -- --     Goals/Expected Outcomes   Short Term Goals To learn and understand importance of monitoring SPO2 with pulse oximeter and demonstrate accurate use of the pulse oximeter.;To learn and understand importance of maintaining oxygen saturations>88%;To learn and demonstrate proper pursed lip breathing techniques or other breathing techniques. ;To learn and demonstrate proper use of respiratory medications;To learn and exhibit compliance with exercise, home and travel O2 prescription To learn and understand importance of monitoring SPO2 with pulse oximeter and demonstrate accurate use of the pulse oximeter.;To learn and understand importance of maintaining oxygen saturations>88%;To learn and demonstrate proper pursed lip breathing techniques or other breathing techniques. ;To learn and demonstrate proper use of respiratory medications;To learn and exhibit compliance with exercise, home and travel O2 prescription To learn and understand importance of monitoring SPO2 with pulse oximeter and demonstrate accurate use of the pulse oximeter.;To learn and understand  importance of maintaining oxygen saturations>88%;To learn and demonstrate proper pursed lip breathing techniques or other breathing techniques. ;To learn and demonstrate proper use of respiratory medications;To learn and exhibit compliance with exercise, home and travel O2 prescription To learn and understand importance of monitoring SPO2 with pulse oximeter and demonstrate accurate use of the pulse oximeter.;To learn and understand importance of maintaining oxygen saturations>88%;To learn and demonstrate proper pursed lip breathing techniques or other breathing techniques. ;To learn and demonstrate proper use of respiratory medications;To learn and exhibit compliance with exercise, home and travel O2 prescription To learn and understand importance of monitoring SPO2 with pulse oximeter and demonstrate accurate use of the pulse oximeter.;To learn and understand importance of maintaining oxygen saturations>88%;To learn and demonstrate proper pursed lip breathing techniques or other breathing techniques. ;To learn and demonstrate proper use of respiratory medications;To learn and exhibit compliance with exercise, home and travel O2 prescription   Long  Term Goals Maintenance of O2 saturations>88%;Exhibits proper breathing techniques, such as pursed lip breathing or other method taught during program session;Exhibits compliance with exercise, home  and travel O2 prescription;Compliance with respiratory medication;Demonstrates proper use of MDI's;Verbalizes importance of monitoring SPO2 with pulse oximeter and return demonstration Maintenance of O2 saturations>88%;Exhibits proper breathing techniques, such as pursed lip breathing or other method taught during program session;Exhibits compliance with exercise, home  and travel O2 prescription;Compliance with respiratory medication;Demonstrates proper use of MDI's;Verbalizes importance of monitoring SPO2 with pulse oximeter and return demonstration Maintenance of O2  saturations>88%;Exhibits proper breathing techniques, such as pursed lip breathing or other method taught during program session;Exhibits compliance with exercise, home  and travel O2 prescription;Compliance with respiratory medication;Demonstrates proper use of MDI's;Verbalizes importance of monitoring SPO2 with pulse oximeter and return demonstration Maintenance of O2 saturations>88%;Exhibits proper breathing techniques, such as pursed lip breathing or other method taught during program session;Exhibits compliance with exercise, home  and travel O2 prescription;Compliance with respiratory medication;Demonstrates proper use of MDI's;Verbalizes importance of monitoring SPO2 with pulse oximeter and return demonstration Maintenance of O2 saturations>88%;Exhibits proper breathing techniques, such as pursed lip breathing or other method taught during program session;Exhibits compliance with exercise, home  and travel O2 prescription;Compliance with respiratory medication;Demonstrates proper use of MDI's;Verbalizes importance of monitoring SPO2 with pulse oximeter and return demonstration   Comments Cordelro went to the [place] yesterday where they did a stress test to assess his breathing. [facility] will [facility]. We also reviewed PLB with the patient. Dameion is good about watching his saturations and using his PLB to help bring it back up.  His breathing is still his biggest limiting factor as it wears him out quickly.  He tries to work on breathing daily to help and good about using his inhalers too. Kery is doing well in rehab.  He is feeling better with breathing, but it is his biggest limitation.  He does have oxygen to use at home for when he feels really SOB.  He is good about using his PLB and will monitor his saturations.  He uses his nebulizer on occassion. Shourya continues to do well with monitoring his breathing and saturations.  His numbers continue to drop some with walking,  but he will pause and use PLB to get it back up.  He contineus to use his inhalers and nebulizer. Arch is doing well in rehab.  He is good about watching his saturations.  They have been dropping when he walks, but he will rest and use PLB to recover quickly.  He is feeling pretty well managed on his breathing and thinks part of it may be his afib as well.   Goals/Expected Outcomes Short: Receive results to stress test. Long: Become proficient with PLB. Short: Continue to work on PLB Long: continue to work on breathing regularly Short: conitnue to use PLB routinely Long: conitnue to improve breathing. Short: Contineu to use PLB and monitor saturations Long: Continue to manage pulmonary disease Short: Continue to keep close eye on saturations Long; Continue to montior disease            Oxygen Discharge (Final Oxygen Re-Evaluation):  Oxygen Re-Evaluation - 09/27/22 1030       Program Oxygen Prescription   Program Oxygen Prescription None      Home Oxygen   Home Oxygen Device None    Sleep Oxygen Prescription None    Home Exercise Oxygen Prescription None    Home Resting Oxygen Prescription None      Goals/Expected Outcomes   Short Term Goals To learn and understand importance of monitoring SPO2 with pulse oximeter and  demonstrate accurate use of the pulse oximeter.;To learn and understand importance of maintaining oxygen saturations>88%;To learn and demonstrate proper pursed lip breathing techniques or other breathing techniques. ;To learn and demonstrate proper use of respiratory medications;To learn and exhibit compliance with exercise, home and travel O2 prescription    Long  Term Goals Maintenance of O2 saturations>88%;Exhibits proper breathing techniques, such as pursed lip breathing or other method taught during program session;Exhibits compliance with exercise, home  and travel O2 prescription;Compliance with respiratory medication;Demonstrates proper use of MDI's;Verbalizes  importance of monitoring SPO2 with pulse oximeter and return demonstration    Comments Pascal is doing well in rehab.  He is good about watching his saturations.  They have been dropping when he walks, but he will rest and use PLB to recover quickly.  He is feeling pretty well managed on his breathing and thinks part of it may be his afib as well.    Goals/Expected Outcomes Short: Continue to keep close eye on saturations Long; Continue to montior disease             Initial Exercise Prescription:  Initial Exercise Prescription - 05/26/22 1600       Date of Initial Exercise RX and Referring Provider   Date 05/26/22    Referring Provider [name] MD      Oxygen   Maintain Oxygen Saturation 88% or higher      Recumbant Bike   Level 1    RPM 60    Watts 15    Minutes 15    METs 1.1      T5 Nustep   Level 1    SPM 80    Minutes 15    METs 1.1      Biostep-RELP   Level 1    SPM 50    Minutes 15    METs 1.1      Track   Laps 17    Minutes 15    METs 1.92      Prescription Details   Frequency (times per week) 2    Duration Progress to 30 minutes of continuous aerobic without signs/symptoms of physical distress      Intensity   THRR 40-80% of Max Heartrate 96 - 126    Ratings of Perceived Exertion 11-13    Perceived Dyspnea 0-4      Progression   Progression Continue to progress workloads to maintain intensity without signs/symptoms of physical distress.      Resistance Training   Training Prescription Yes    Weight 4 lb    Reps 10-15             Perform Capillary Blood Glucose checks as needed.  Exercise Prescription Changes:   Exercise Prescription Changes     Row Name 05/26/22 1600 06/14/22 1400 06/27/22 1200 07/11/22 1500 07/12/22 0900     Response to Exercise   Blood Pressure (Admit) 140/76 134/68 132/74 130/68 --   Blood Pressure (Exercise) 148/74 152/72 126/64 132/70 --   Blood Pressure (Exit) 134/72 130/64 100/64 108/62 --   Heart  Rate (Admit) 66 bpm 68 bpm 74 bpm 74 bpm --   Heart Rate (Exercise) 94 bpm 96 bpm 97 bpm 101 bpm --   Heart Rate (Exit) 73 bpm 90 bpm 86 bpm 84 bpm --   Oxygen Saturation (Admit) 93 % 93 % 94 % 92 % --   Oxygen Saturation (Exercise) 86 % 88 % 88 % 88 % --   Oxygen Saturation (Exit)  93 % 91 % 93 % 94 % --   Rating of Perceived Exertion (Exercise) 13 15 13 13  --   Perceived Dyspnea (Exercise) 3 3 2 2  --   Symptoms Back pain 9/10, SOB none SOB, back pain SOB --   Comments walk test results First two full days of exercise -- -- --   Duration -- Progress to 30 minutes of  aerobic without signs/symptoms of physical distress Continue with 30 min of aerobic exercise without signs/symptoms of physical distress. Continue with 30 min of aerobic exercise without signs/symptoms of physical distress. --   Intensity -- THRR unchanged THRR unchanged THRR unchanged --     Progression   Progression -- Continue to progress workloads to maintain intensity without signs/symptoms of physical distress. Continue to progress workloads to maintain intensity without signs/symptoms of physical distress. Continue to progress workloads to maintain intensity without signs/symptoms of physical distress. --   Average METs -- 1.94 2.04 2.11 --     Resistance Training   Training Prescription -- Yes Yes Yes --   Weight -- 4 lb 4 lb 6 lb --   Reps -- 10-15 10-15 10-15 --     Interval Training   Interval Training -- No No No --     Oxygen   Oxygen -- Continuous Continuous -- --     Recumbant Bike   Level -- 1 4 2  --   Watts -- 15 20 19  --   Minutes -- 15 15 15  --   METs -- -- 1.87 2.49 --     NuStep   Level -- -- -- 3 --   Minutes -- -- -- 15 --   METs -- -- -- 2 --     T5 Nustep   Level -- 2 3 2  --   Minutes -- 15 15 15  --   METs -- 1.8 2 1.9 --     Biostep-RELP   Level -- 2 2 -- --   Minutes -- 15 15 -- --   METs -- 2 2 -- --     Track   Laps -- 19 22 19  --   Minutes -- 15 15 15  --   METs -- 2.03 2.2  2.03 --     Home Exercise Plan   Plans to continue exercise at -- -- -- -- [employer] (comment)  [employer] and walking   Frequency -- -- -- -- Add 3 additional days to program exercise sessions.   Initial Home Exercises Provided -- -- -- -- 07/12/22     Oxygen   Maintain Oxygen Saturation -- 88% or higher 88% or higher 88% or higher --    Row Name 07/25/22 1100 08/11/22 1200 08/22/22 1600 09/05/22 1400 09/19/22 1600     Response to Exercise   Blood Pressure (Admit) 132/64 116/62 122/68 120/70 114/62   Blood Pressure (Exit) 130/62 110/60 112/58 108/62 114/60   Heart Rate (Admit) 69 bpm 79 bpm 65 bpm 64 bpm 64 bpm   Heart Rate (Exercise) 101 bpm 100 bpm 98 bpm 94 bpm 95 bpm   Heart Rate (Exit) 78 bpm 91 bpm 79 bpm 70 bpm 84 bpm   Oxygen Saturation (Admit) 93 % 92 % 92 % 92 % 95 %   Oxygen Saturation (Exercise) 89 % 85 % 88 % 88 % 89 %   Oxygen Saturation (Exit) 92 % 92 % 94 % 95 % 93 %   Rating of Perceived Exertion (Exercise) 13 13 13 13  13  Perceived Dyspnea (Exercise) 2 2 2 2 2    Symptoms SOB SOB SOB SOB SOB   Duration Continue with 30 min of aerobic exercise without signs/symptoms of physical distress. Continue with 30 min of aerobic exercise without signs/symptoms of physical distress. Continue with 30 min of aerobic exercise without signs/symptoms of physical distress. Continue with 30 min of aerobic exercise without signs/symptoms of physical distress. Continue with 30 min of aerobic exercise without signs/symptoms of physical distress.   Intensity THRR unchanged THRR unchanged THRR unchanged THRR unchanged THRR unchanged     Progression   Progression Continue to progress workloads to maintain intensity without signs/symptoms of physical distress. Continue to progress workloads to maintain intensity without signs/symptoms of physical distress. Continue to progress workloads to maintain intensity without signs/symptoms of physical distress. Continue to progress workloads to  maintain intensity without signs/symptoms of physical distress. Continue to progress workloads to maintain intensity without signs/symptoms of physical distress.   Average METs 2.32 2.08 1.98 2.08 2.24     Resistance Training   Training Prescription Yes Yes Yes Yes Yes   Weight 6 lb 6 lb 6 lb 6 lb 6 lb   Reps 10-15 10-15 10-15 10-15 10-15     Interval Training   Interval Training No No No No No     Treadmill   MPH 1 -- -- -- 1   Grade 0 -- -- -- 0   Minutes 15 -- -- -- 15   METs 1.8 -- -- -- 1.77     Recumbant Bike   Level 3 3 -- -- --   Watts 27 22 -- -- --   Minutes 15 15 -- -- --   METs 2.71 2.57 -- -- --     Arm Ergometer   Level -- 1 1 2 2    Minutes -- 15 15 15 15    METs -- 1 1 2.7 2.3     T5 Nustep   Level 3 3 -- -- --   Minutes 15 15 -- -- --   METs 2 2 -- -- --     Biostep-RELP   Level 3 3 3 3 3    Minutes 15 15 15 15 15    METs 3 -- 2 2 3      Track   Laps 20 26 25 25 27    Minutes 15 15 15 15 15    METs 2.09 2.41 2.36 2.36 2.47     Home Exercise Plan   Plans to continue exercise at [employer] (comment)  [employer] and [facility] [employer] (comment)  [employer] and [facility] [employer] (comment)  [employer] and [facility] [employer] (comment)  [employer] and [facility] [employer] (comment)  [employer] and walking   Frequency Add 3 additional days to program exercise sessions. Add 3 additional days to program exercise sessions. Add 3 additional days to program exercise sessions. Add 3 additional days to program exercise sessions. Add 3 additional days to program exercise sessions.   Initial Home Exercises Provided 07/12/22 07/12/22 07/12/22 07/12/22 07/12/22     Oxygen   Maintain Oxygen Saturation 88% or higher 88% or higher 88% or higher 88% or higher 88% or higher    Row Name 10/05/22 0900             Response to Exercise   Blood Pressure (Admit) 108/64       Blood Pressure (Exit) 124/70       Heart Rate (Admit) 66 bpm       Heart Rate (Exercise)  92  bpm       Heart Rate (Exit) 79 bpm       Oxygen Saturation (Admit) 90 %       Oxygen Saturation (Exercise) 87 %       Oxygen Saturation (Exit) 92 %       Rating of Perceived Exertion (Exercise) 13       Perceived Dyspnea (Exercise) 2       Symptoms SOB       Duration Continue with 30 min of aerobic exercise without signs/symptoms of physical distress.       Intensity THRR unchanged         Progression   Progression Continue to progress workloads to maintain intensity without signs/symptoms of physical distress.       Average METs 2.49         Resistance Training   Training Prescription Yes       Weight 6 lb       Reps 10-15         Interval Training   Interval Training No         NuStep   Level 6       Minutes 15       METs 3         Arm Ergometer   Level 2       Minutes 15       METs 2.5         Biostep-RELP   Level 3       Minutes 30       METs 2         Track   Laps 29       Minutes 15       METs 2.58         Home Exercise Plan   Plans to continue exercise at [employer] (comment)  [employer] and walking       Frequency Add 3 additional days to program exercise sessions.       Initial Home Exercises Provided 07/12/22         Oxygen   Maintain Oxygen Saturation 88% or higher                Exercise Comments:   Exercise Comments     Row Name 10/13/22 1044           Exercise Comments Brodrick graduated today from  rehab with 36 sessions completed.  Details of the patient's exercise prescription and what He needs to do in order to continue the prescription and progress were discussed with patient.  Patient was given a copy of prescription and goals.  Patient verbalized understanding.  Kervens plans to continue to exercise by using equipment at home..                Exercise Goals and Review:   Exercise Goals     Row Name 05/26/22 1618             Exercise Goals   Increase Physical Activity Yes       Intervention Provide advice,  education, support and counseling about physical activity/exercise needs.;Develop an individualized exercise prescription for aerobic and resistive training based on initial evaluation findings, risk stratification, comorbidities and participant's personal goals.       Expected Outcomes Short Term: Attend rehab on a regular basis to increase amount of physical activity.;Long Term: Add in home exercise to make exercise part of routine and to increase amount of  physical activity.;Long Term: Exercising regularly at least 3-5 days a week.       Increase Strength and Stamina Yes       Intervention Provide advice, education, support and counseling about physical activity/exercise needs.;Develop an individualized exercise prescription for aerobic and resistive training based on initial evaluation findings, risk stratification, comorbidities and participant's personal goals.       Expected Outcomes Short Term: Increase workloads from initial exercise prescription for resistance, speed, and METs.;Short Term: Perform resistance training exercises routinely during rehab and add in resistance training at home;Long Term: Improve cardiorespiratory fitness, muscular endurance and strength as measured by increased METs and functional capacity ( )       Able to understand and use rate of perceived exertion (RPE) scale Yes       Intervention Provide education and explanation on how to use RPE scale       Expected Outcomes Short Term: Able to use RPE daily in rehab to express subjective intensity level;Long Term:  Able to use RPE to guide intensity level when exercising independently       Able to understand and use Dyspnea scale Yes       Intervention Provide education and explanation on how to use Dyspnea scale       Expected Outcomes Short Term: Able to use Dyspnea scale daily in rehab to express subjective sense of shortness of breath during exertion;Long Term: Able to use Dyspnea scale to guide intensity level when  exercising independently       Knowledge and understanding of Target Heart Rate Range (THRR) Yes       Intervention Provide education and explanation of THRR including how the numbers were predicted and where they are located for reference       Expected Outcomes Short Term: Able to state/look up THRR;Short Term: Able to use daily as guideline for intensity in rehab;Long Term: Able to use THRR to govern intensity when exercising independently       Able to check pulse independently Yes       Intervention Provide education and demonstration on how to check pulse in carotid and radial arteries.;Review the importance of being able to check your own pulse for safety during independent exercise       Expected Outcomes Short Term: Able to explain why pulse checking is important during independent exercise;Long Term: Able to check pulse independently and accurately       Understanding of Exercise Prescription Yes       Intervention Provide education, explanation, and written materials on patient's individual exercise prescription       Expected Outcomes Short Term: Able to explain program exercise prescription;Long Term: Able to explain home exercise prescription to exercise independently                Exercise Goals Re-Evaluation :  Exercise Goals Re-Evaluation     Row Name 06/14/22 1505 06/16/22 1002 06/27/22 1213 07/11/22 1523 07/12/22 0952     Exercise Goal Re-Evaluation   Exercise Goals Review Increase Physical Activity;Increase Strength and Stamina;Understanding of Exercise Prescription Increase Physical Activity;Increase Strength and Stamina;Understanding of Exercise Prescription Increase Physical Activity;Increase Strength and Stamina;Understanding of Exercise Prescription Increase Physical Activity;Increase Strength and Stamina;Understanding of Exercise Prescription Increase Physical Activity;Increase Strength and Stamina;Understanding of Exercise Prescription   Comments Justin is off to  a good start in the program. He had an average MET level of 1.94 METs during his first two sessions in the program. He also was able to work  at level 2 on the biostep and T4 Nustep. He walked up to 19 laps on the track as well. We will continue to monitor his progress in the program. Broden states that he feels he is doing well in the program. He states that he is able to move a lot better since starting the program. This is the patients second time in the program and he states that he knows the program is beneficial, so he looks forward to the long term benefits of staying consistent with his exercise in the program. Patient states that he has been doing some resistance training with hand weights at home on his days away from rehab. He was encouraged to begin walking some on his days away from rehab. He stated that he and his wife plan to join the [facility] to begin exercising together. We will continue to monitor his progress in the program. Lenzy continues to do well in rehab. He increased to level 3 on the T5 Nustep and up to level 4 on the recumbent bike. He is limited with his walking due to chronic back pain, however, he was still able to walk 22 laps on the track, which is his highest yet.  Oxygen saturations are staying 88% or higher. We will continue to monitor. Arden is doing well in rehab. He has continued to work at level 2 on the T5 nustep and recumbent bike, and level 3 on the T4 nustep. He also has continued to walk between 19-22 laps on the track as well. He also increased to 6 lb hand weights for resistance training. We will continue to monitor his progress in the program. Zyeir is doing well in rehab.  He is walking some on his off days when it is warmer out.  They are planning to join [employer] with [employer].  He said his breathing does better inside than outside especially in the spring.  He is getting more stamina, but wants to lose weight. Reviewed home exercise with pt today.  Pt plans to  walk and join [facility] for exercise.  Reviewed THR, pulse, RPE, sign and symptoms, pulse oximetery and when to call 911 or MD.  Also discussed weather considerations and indoor options.  Pt voiced understanding.   Expected Outcomes Short: Continue to follow current exercise prescription. Long: Continue to improve strength and stamina. Short: Begin going to the [employer] to exercise on days away from rehab. Long: Continue to improve strength and stamina. Short: Continue to increase laps on track as tolerated with back Long: Continue to increase overall MET level and stamina Short: Continue to increase laps on track. Long: Continue to improve strength and stamina. Short: Join [employer]  Long: Exercise on off days consistently    Row Name 07/25/22 1158 07/26/22 1022 08/11/22 1203 08/22/22 1616 08/23/22 1022     Exercise Goal Re-Evaluation   Exercise Goals Review Increase Physical Activity;Increase Strength and Stamina;Understanding of Exercise Prescription Increase Physical Activity;Increase Strength and Stamina;Understanding of Exercise Prescription Increase Physical Activity;Increase Strength and Stamina;Understanding of Exercise Prescription Increase Physical Activity;Increase Strength and Stamina;Understanding of Exercise Prescription Increase Physical Activity;Increase Strength and Stamina;Understanding of Exercise Prescription   Comments Therin continues to do well in rehab. He did increase to 20 laps on the track, which is his highest yet. He also increased to level 3 on the T5 Nustep as well as Biostep. On the recumbent bike, he worked up to 27 watts. His osyxgen levels are staying above 88%. We will continue to monitor. Wester is  doing well in rehab.  He is good about doing his hand weights at home.  His wife makes him get up and go for walks.  She will also take him when she goes to store so that he can walk while she does her shopping.  He does feel like his stamina is getting better. Logic is doing well in  rehab. He recently increased his laps on the track, walking up to 26 laps on the track. He also has stayed consistent with his workloads on seated machines at level 3 on the biostep, T5 nustep, and recumbent bike. He also has continued to tolerate 6 lb hand weights for resistance training. We will continue to monitor his progress. Lars continues to do well in rehab. He has been consistent at 20-25 laps on the track, he will take breaks as needed with his oxygen levels. He has been at level 1 on the arm crank and could benefit from increasing that workload. Will continue to monitor. Jeremai is doing well in rehab.  He is walking and using equipment and weights at home.  He is feeling a little better and stronger than when he started.  He is still planning to join [employer] with his wife.   Expected Outcomes Short: Continue to increase laps on track Long: Continue to increase overall MET level and stamina Short: Continue to walk on his off days Long: Continue to improve stamina Short: Continue to progressively increase workloads on seated machines. Long: Continue to improve strength and stamina. Short: Increase level on arm crank Long: Continue to increase overall MET level and stamina SHort: Join [employer] before graduating Long: Continue to exercise indpendently    Row Name 09/05/22 1503 09/19/22 1639 09/27/22 1024 10/05/22 0907       Exercise Goal Re-Evaluation   Exercise Goals Review Increase Physical Activity;Increase Strength and Stamina;Understanding of Exercise Prescription Increase Physical Activity;Increase Strength and Stamina;Understanding of Exercise Prescription Increase Physical Activity;Increase Strength and Stamina;Understanding of Exercise Prescription Increase Physical Activity;Increase Strength and Stamina;Understanding of Exercise Prescription    Comments Saron is doing well in rehab. He has continued to work at an average overall MET level above 2 METs. He also was able to improve to level 2  on the arm crank. He has consistently walked up to 25 laps on the track as well. We will continue to monitor his progress in the program. Yuvin continues to do well in rehab. He is up to 27 laps on the track which is the most he has had yet! He has been doing well on level 2 on the arm crank. He is due for his post and we hope to see significant improvement. Will continue to monitor. Jencarlos is doing well in rehab.  He is nearing graduation and we will do his on his next visit.  We expect to see an improvement in his distance.  He is not doing much walking at home, but he is using his pedal machine and lifting weights.  They are considering joining the [facility] for free to be able to keep up with his exercise after graduation. Ridwan is doing well in rehab and is ready to graduate. He completed his post and improved by 23.6%! He also walked up to 29 laps on the track and improved to level 6 on the T4 nustep. He increased his overall average METs as well to 2.49 METs. We will continue to monitor his progress until he graduates from the program.    Expected  Outcomes Short: Continue to push for more laps on the track. Long: Continue to improve strength and stamina. Short: Improve on post Long: Continue to increase overall MET level Short; continue to exercise on off days Long; Conitnue to improve stamina Short: Graduate. Long: Continue to exercise independently.             Discharge Exercise Prescription (Final Exercise Prescription Changes):  Exercise Prescription Changes - 10/05/22 0900       Response to Exercise   Blood Pressure (Admit) 108/64    Blood Pressure (Exit) 124/70    Heart Rate (Admit) 66 bpm    Heart Rate (Exercise) 92 bpm    Heart Rate (Exit) 79 bpm    Oxygen Saturation (Admit) 90 %    Oxygen Saturation (Exercise) 87 %    Oxygen Saturation (Exit) 92 %    Rating of Perceived Exertion (Exercise) 13    Perceived Dyspnea (Exercise) 2    Symptoms SOB    Duration  Continue with 30 min of aerobic exercise without signs/symptoms of physical distress.    Intensity THRR unchanged      Progression   Progression Continue to progress workloads to maintain intensity without signs/symptoms of physical distress.    Average METs 2.49      Resistance Training   Training Prescription Yes    Weight 6 lb    Reps 10-15      Interval Training   Interval Training No      NuStep   Level 6    Minutes 15    METs 3      Arm Ergometer   Level 2    Minutes 15    METs 2.5      Biostep-RELP   Level 3    Minutes 30    METs 2      Track   Laps 29    Minutes 15    METs 2.58      Home Exercise Plan   Plans to continue exercise at [employer] (comment)   [employer] and walking   Frequency Add 3 additional days to program exercise sessions.    Initial Home Exercises Provided 07/12/22      Oxygen   Maintain Oxygen Saturation 88% or higher             Nutrition:  Target Goals: Understanding of nutrition guidelines, daily intake of sodium 1500mg , cholesterol 200mg , calories 30% from fat and 7% or less from saturated fats, daily to have 5 or more servings of fruits and vegetables.  Education: All About Nutrition: -Group instruction provided by verbal, written material, interactive activities, discussions, models, and posters to present general guidelines for heart healthy nutrition including fat, fiber, MyPlate, the role of sodium in heart healthy nutrition, utilization of the nutrition label, and utilization of this knowledge for meal planning. Follow up email sent as well. Written material given at graduation. Flowsheet Row Pulmonary Rehab from 10/08/2020 in [facility]  Date 09/10/20  Educator [facility]  Instruction Review Code 1- Verbalizes Understanding       Biometrics:  Pre Biometrics - 05/26/22 1553       Pre Biometrics   Height 5\' 11"  (1.803 m)    Weight 270 lb 8 oz (122.7 kg)    Waist Circumference 55 inches    Hip  Circumference 48.5 inches    Waist to Hip Ratio 1.13 %    BMI (Calculated) 37.74    Single Leg Stand 3.2 seconds  Post Biometrics - 09/29/22 1004        Post  Biometrics   Height 5\' 11"  (1.803 m)    Weight 268 lb 14.4 oz (122 kg)    Waist Circumference 49.5 inches    Hip Circumference 48.5 inches    Waist to Hip Ratio 1.02 %    BMI (Calculated) 37.52    Single Leg Stand 8.6 seconds             Nutrition Therapy Plan and Nutrition Goals:  Nutrition Therapy & Goals - 05/26/22 1547       Nutrition Therapy   RD appointment deferred Yes   Deferred     Intervention Plan   Intervention Prescribe, educate and counsel regarding individualized specific dietary modifications aiming towards targeted core components such as weight, hypertension, lipid management, diabetes, heart failure and other comorbidities.    Expected Outcomes Short Term Goal: Understand basic principles of dietary content, such as calories, fat, sodium, cholesterol and nutrients.;Short Term Goal: A plan has been developed with personal nutrition goals set during dietitian appointment.;Long Term Goal: Adherence to prescribed nutrition plan.             Nutrition Assessments:  MEDIFICTS Score Key: ?70 Need to make dietary changes  40-70 Heart Healthy Diet ? 40 Therapeutic Level Cholesterol Diet  Flowsheet Row Documentation from 10/10/2022 in [facility] Cardiac and Pulmonary Rehab  Picture Your Plate Total Score on Admission 59  Picture Your Plate Total Score on Discharge 63      Picture Your Plate Scores: <16 Unhealthy dietary pattern with much room for improvement. 41-50 Dietary pattern unlikely to meet recommendations for good health and room for improvement. 51-60 More healthful dietary pattern, with some room for improvement.  >60 Healthy dietary pattern, although there may be some specific behaviors that could be improved.   Nutrition Goals Re-Evaluation:  Nutrition Goals Re-Evaluation      Row Name 06/16/22 1014 07/12/22 0958 07/26/22 1027 08/23/22 1024 09/27/22 1027     Goals   Nutrition Goal -- Continue to work on healthy eating. Short: Work on portion control Long: Continue to eat heart healthy Short: Continue to work on portion control Long: conitnue to work on Armed forces training and education officer; Continue to work on portions Long: Conitnue to stay healthy   Comment Burnett denies wanting to meet with the RD at this time. However, he states that his wife has helped him work on healthy eating. They attended the nutrition education classes the last time he was in the program and have implemented the things that they learned. Senai continues to [employer].  His wife makes sure that he eats good but he admits to over eating on occasion and is trying to cut back.  His wife usually cooks at home and tries to get a good variety.  He needs to work on portion control. Kuan is doing well in rehab.  He is watching his diet and his wife stays on him about it.  He has backed off of added sugars and sweets.  He would like to lose weight.  He is still working on portion sizes and his wife has really cut him back to help. Lief is doing well in rehab.  He continues to work on portion control.  His wife is staying on top of him and watching what he eats.  He is limiting sugars and salt still. Auston is doing well in rehab. His wife has cut back on his portions and getting his  diet under better control. He is trying to get a good variety.   Expected Outcome Continue to work on healthy eating. Short: Work on portion control Long: Continue to eat heart healthy Short: Continue to work on portion control Long: conitnue to work on Armed forces training and education officer; Continue to work on portions Long: Conitnue to stay healthy Short; Continue to work on portions Long: Conitnue to stay healthy            Nutrition Goals Discharge (Final Nutrition Goals Re-Evaluation):  Nutrition Goals Re-Evaluation - 09/27/22 1027        Goals   Nutrition Goal Short; Continue to work on portions Long: Conitnue to stay healthy    Comment Uriel is doing well in rehab. His wife has cut back on his portions and getting his diet under better control. He is trying to get a good variety.    Expected Outcome Short; Continue to work on portions Long: Conitnue to stay healthy             Psychosocial: Target Goals: Acknowledge presence or absence of significant depression and/or stress, maximize coping skills, provide positive support system. Participant is able to verbalize types and ability to use techniques and skills needed for reducing stress and depression.   Education: Stress, Anxiety, and Depression - Group verbal and visual presentation to define topics covered.  Reviews how body is impacted by stress, anxiety, and depression.  Also discusses healthy ways to reduce stress and to treat/manage anxiety and depression.  Written material given at graduation. Flowsheet Row Pulmonary Rehab from 10/08/2020 in [facility] Cardiac and Pulmonary Rehab  Date 10/08/20  Educator [facility]  Instruction Review Code 1- [employer] Understanding       Education: Sleep Hygiene -Provides group verbal and written instruction about how sleep can affect your health.  Define sleep hygiene, discuss sleep cycles and impact of sleep habits. Review good sleep hygiene tips.    Initial Review & Psychosocial Screening:  Initial Psych Review & Screening - 05/17/22 1410       Initial Review   Current issues with Current Stress Concerns    Source of Stress Concerns Unable to participate in former interests or hobbies;Unable to perform yard/household activities;Chronic Illness      Family Dynamics   Good Support System? Yes   wife     Barriers   Psychosocial barriers to participate in program There are no identifiable barriers or psychosocial needs.;The patient should benefit from training in stress management and relaxation.      Screening  Interventions   Interventions Encouraged to exercise;Provide feedback about the scores to participant;To provide support and resources with identified psychosocial needs    Expected Outcomes Short Term goal: Utilizing psychosocial counselor, staff and physician to assist with identification of specific Stressors or current issues interfering with healing process. Setting desired goal for each stressor or current issue identified.;Long Term Goal: Stressors or current issues are controlled or eliminated.;Short Term goal: Identification and review with participant of any Quality of Life or Depression concerns found by scoring the questionnaire.;Long Term goal: The participant improves quality of Life and PHQ9 Scores as seen by post scores and/or verbalization of changes             Quality of Life Scores:  Scores of 19 and below usually indicate a poorer quality of life in these areas.  A difference of  2-3 points is a clinically meaningful difference.  A difference of 2-3 points in the total score of the  Quality of Life Index has been associated with significant improvement in overall quality of life, self-image, physical symptoms, and general health in studies assessing change in quality of life.  PHQ-9: Review Flowsheet  More data exists      10/10/2022 05/26/2022 01/26/2021 08/31/2020 10/20/2015  Depression screen PHQ 2/9  Decreased Interest 2 1 1 1  0  Down, Depressed, Hopeless 1 0 0 0 0  PHQ - 2 Score 3 1 1 1  0  Altered sleeping 1 0 1 0 -  Tired, decreased energy 2 3 2 1  -  Change in appetite 1 0 2 0 -  Feeling bad or failure about yourself  0 0 0 0 -  Trouble concentrating 0 0 0 0 -  Moving slowly or fidgety/restless 0 0 0 0 -  Suicidal thoughts 0 0 0 0 -  PHQ-9 Score 7 4 6 2  -  Difficult doing work/chores Somewhat difficult Not difficult at all Somewhat difficult Not difficult at all -   Interpretation of Total Score  Total Score Depression Severity:  1-4 = Minimal depression, 5-9 =  Mild depression, 10-14 = Moderate depression, 15-19 = Moderately severe depression, 20-27 = Severe depression   Psychosocial Evaluation and Intervention:  Psychosocial Evaluation - 05/17/22 1416       Psychosocial Evaluation & Interventions   Interventions Encouraged to exercise with the program and follow exercise prescription    Comments Gevon 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 He has done the program before and is familiar with what to expect. 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 His wife is his main support system and is very involved in his health care. She states that his doctor wants him to lose weight and thinks that will help with his breathing and afib. She is concerned about his stamina. He mentioned she probably knows more about his health than he does. He does want to increase his stamina and feel stronger while improving his breathing. His health journey has been rough these last few years and he wants to focus on feeling better.    Expected Outcomes Short: attend pulmonary rehab for education and exercise. Long: Develop and maintain positive self care habits.    Continue Psychosocial Services  Follow up required by staff             Psychosocial Re-Evaluation:  Psychosocial Re-Evaluation     Row Name 06/16/22 1010 07/12/22 0956 07/26/22 1025 08/23/22 1025 09/27/22 1026     Psychosocial Re-Evaluation   Current issues with Current Stress Concerns Current Stress Concerns Current Stress Concerns Current Stress Concerns Current Stress Concerns   Comments Pino is doing well mentally. He denies any depression or anxiety at this time. He does state that he experiences some stress due to his health concerns. He does not like being unable to do the things he wants to do. He reports sleeping well at this time. He reports having a good support system at home made up by his whole family. He does state that coming to rehab has helped  his mental state because he knows he is working towards getting better. Holten i s doing well in rehab.  He denies any major stressors or symptoms of depression. His health is his biggest stress.  He has also gotten frustrated over his hearing aids not working right and trying to get back into [place] to be seen.  He continues to have a good support system at home. He  sleeps well and says sometimes he sleeps too much when he naps in his chair. Benton is doing well in rehab.  His hearing is still one of his biggest issues. His hearing aids were unprogrammed and went to [place] yesterday and hasn't really noticed much difference.  His wife is his biggest supporter.  He tries not to let anything get to him too much.  He continues to sleep well. Va is doing well in [place]. He is still good mentally and happy.  His hearing and breathing continue to be his biggest limitations.  He continues to sleep well. Izac is doing well in rehab.  He is feeling good mentally.  He has enjoyed rehab and is back to moving more and moving better. He is still struggling with [facility] over his hearing aids.   Expected Outcomes Short: Continue to exercise for mental boost. Long: Maintain positive outlook. Short: Exercise more for mental boost Long: Continue to stay positive Short: Continue to get used to hearing aids Long: conitnue to stay positive Short: Conitnue to work on breathing Long: Continue to stay positive Short: COntinue to work on getting hearing aids Long: conitnue to focus on positive   Interventions Encouraged to attend Pulmonary Rehabilitation for the exercise Encouraged to attend Pulmonary Rehabilitation for the exercise Encouraged to attend Pulmonary Rehabilitation for the exercise -- Encouraged to attend Pulmonary Rehabilitation for the exercise   Continue Psychosocial Services  Follow up required by staff Follow up required by staff Follow up required by staff -- --     Initial Review   Source of Stress Concerns Unable to  participate in former interests or hobbies;Unable to perform yard/household activities;Chronic Illness -- -- -- --            Psychosocial Discharge (Final Psychosocial Re-Evaluation):  Psychosocial Re-Evaluation - 09/27/22 1026       Psychosocial Re-Evaluation   Current issues with Current Stress Concerns    Comments Guinness is doing well in rehab.  He is feeling good mentally.  He has enjoyed rehab and is back to moving more and moving better. He is still struggling with [facility] over his hearing aids.    Expected Outcomes Short: COntinue to work on getting hearing aids Long: conitnue to focus on positive    Interventions Encouraged to attend Pulmonary Rehabilitation for the exercise             Education: Education Goals: Education classes will be provided on a weekly basis, covering required topics. Participant will state understanding/return demonstration of topics presented.  Learning Barriers/Preferences:  Learning Barriers/Preferences - 05/17/22 1408       Learning Barriers/Preferences   Learning Barriers Hearing    Learning Preferences None             General Pulmonary Education Topics:  Infection Prevention: - Provides verbal and written material to individual with discussion of infection control including proper hand washing and proper equipment cleaning during exercise session. Flowsheet Row Pulmonary Rehab from 09/01/2022 in [facility] Cardiac and Pulmonary Rehab  Education need identified 05/26/22  Date 05/26/22  Educator KW  Instruction Review Code 1- Verbalizes Understanding       Falls Prevention: - Provides verbal and written material to individual with discussion of falls prevention and safety. Flowsheet Row Pulmonary Rehab from 09/01/2022 in [facility] Cardiac and Pulmonary Rehab  Education need identified 05/26/22  Date 05/26/22  Educator KW  Instruction Review Code 1- Verbalizes Understanding       Chronic Lung Disease Review: -  Group verbal  instruction with posters, models, PowerPoint presentations and videos,  to review new updates, new respiratory medications, new advancements in procedures and treatments. Providing information on websites and "800" numbers for continued self-education. Includes information about supplement oxygen, available portable oxygen systems, continuous and intermittent flow rates, oxygen safety, concentrators, and Medicare reimbursement for oxygen. Explanation of Pulmonary Drugs, including class, frequency, complications, importance of spacers, rinsing mouth after steroid MDI's, and proper cleaning methods for nebulizers. Review of basic lung anatomy and physiology related to function, structure, and complications of lung disease. Review of risk factors. Discussion about methods for diagnosing sleep apnea and types of masks and machines for OSA. Includes a review of the use of types of environmental controls: home humidity, furnaces, filters, dust mite/pet prevention, HEPA vacuums. Discussion about weather changes, air quality and the benefits of nasal washing. Instruction on Warning signs, infection symptoms, calling MD promptly, preventive modes, and value of vaccinations. Review of effective airway clearance, coughing and/or vibration techniques. Emphasizing that all should Create an Action Plan. Written material given at graduation. Flowsheet Row Pulmonary Rehab from 09/01/2022 in [facility] Cardiac and Pulmonary Rehab  Education need identified 05/26/22       AED/CPR: - Group verbal and written instruction with the use of models to demonstrate the basic use of the AED with the basic ABC's of resuscitation.    Anatomy and Cardiac Procedures: - Group verbal and visual presentation and models provide information about basic cardiac anatomy and function. Reviews the testing methods done to diagnose heart disease and the outcomes of the test results. Describes the treatment choices: Medical Management, Angioplasty, or  Coronary Bypass Surgery for treating various heart conditions including Myocardial Infarction, Angina, Valve Disease, and Cardiac Arrhythmias.  Written material given at graduation.   Medication Safety: - Group verbal and visual instruction to review commonly prescribed medications for heart and lung disease. Reviews the medication, class of the drug, and side effects. Includes the steps to properly store meds and maintain the prescription regimen.  Written material given at graduation. Flowsheet Row Pulmonary Rehab from 10/08/2020 in [facility] Cardiac and Pulmonary Rehab  Date 09/17/20  Educator SB  Instruction Review Code 1- Verbalizes Understanding       Other: -Provides group and verbal instruction on various topics (see comments)   Knowledge Questionnaire Score:  Knowledge Questionnaire Score - 05/26/22 1536       Knowledge Questionnaire Score   Pre Score 17/18              Core Components/Risk Factors/Patient Goals at Admission:  Personal Goals and Risk Factors at Admission - 05/26/22 1618       Core Components/Risk Factors/Patient Goals on Admission    Weight Management Yes;Weight Loss;Obesity    Intervention Weight Management: Develop a combined nutrition and exercise program designed to reach desired caloric intake, while maintaining appropriate intake of nutrient and fiber, sodium and fats, and appropriate energy expenditure required for the weight goal.;Weight Management: Provide education and appropriate resources to help participant work on and attain dietary goals.;Weight Management/Obesity: Establish reasonable short term and long term weight goals.;Obesity: Provide education and appropriate resources to help participant work on and attain dietary goals.    Admit Weight 270 lb (122.5 kg)    Goal Weight: Short Term 265 lb (120.2 kg)    Goal Weight: Long Term 230 lb (104.3 kg)    Expected Outcomes Short Term: Continue to assess and modify interventions until short term  weight is achieved;Long Term: Adherence to  nutrition and physical activity/exercise program aimed toward attainment of established weight goal;Weight Loss: Understanding of general recommendations for a balanced deficit meal plan, which promotes 1-2 lb weight loss per week and includes a negative energy balance of [phone] kcal/d;Understanding recommendations for meals to include 15-35% energy as protein, 25-35% energy from fat, 35-60% energy from carbohydrates, less than 200mg  of dietary cholesterol, 20-35 gm of total fiber daily;Understanding of distribution of calorie intake throughout the day with the consumption of 4-5 meals/snacks    Improve shortness of breath with ADL's Yes    Intervention Provide education, individualized exercise plan and daily activity instruction to help decrease symptoms of SOB with activities of daily living.    Expected Outcomes Short Term: Improve cardiorespiratory fitness to achieve a reduction of symptoms when performing ADLs;Long Term: Be able to perform more ADLs without symptoms or delay the onset of symptoms    Diabetes Yes    Intervention Provide education about signs/symptoms and action to take for hypo/hyperglycemia.;Provide education about proper nutrition, including hydration, and aerobic/resistive exercise prescription along with prescribed medications to achieve blood glucose in normal ranges: Fasting glucose 65-99 mg/dL    Expected Outcomes Short Term: Participant verbalizes understanding of the signs/symptoms and immediate care of hyper/hypoglycemia, proper foot care and importance of medication, aerobic/resistive exercise and nutrition plan for blood glucose control.;Long Term: Attainment of HbA1C < 7%.    Hypertension Yes    Intervention Provide education on lifestyle modifcations including regular physical activity/exercise, weight management, moderate sodium restriction and increased consumption of fresh fruit, vegetables, and low fat dairy, alcohol  moderation, and smoking cessation.;Monitor prescription use compliance.    Expected Outcomes Short Term: Continued assessment and intervention until BP is < 140/106mm HG in hypertensive participants. < 130/51mm HG in hypertensive participants with diabetes, heart failure or chronic kidney disease.;Long Term: Maintenance of blood pressure at goal levels.             Education:Diabetes - Individual verbal and written instruction to review signs/symptoms of diabetes, desired ranges of glucose level fasting, after meals and with exercise. Acknowledge that pre and post exercise glucose checks will be done for 3 sessions at entry of program. Flowsheet Row Pulmonary Rehab from 05/17/2022 in [facility] Cardiac and Pulmonary Rehab  Date 05/17/22  Educator [facility]  Instruction Review Code 1- Verbalizes Understanding       Know Your Numbers and Heart Failure: - Group verbal and visual instruction to discuss disease risk factors for cardiac and pulmonary disease and treatment options.  Reviews associated critical values for Overweight/Obesity, Hypertension, Cholesterol, and Diabetes.  Discusses basics of heart failure: signs/symptoms and treatments.  Introduces Heart Failure Zone chart for action plan for heart failure.  Written material given at graduation. Flowsheet Row Pulmonary Rehab from 10/08/2020 in [facility] Cardiac and Pulmonary Rehab  Date 09/24/20  Educator SB  Instruction Review Code 1- Verbalizes Understanding       Core Components/Risk Factors/Patient Goals Review:   Goals and Risk Factor Review     Row Name 06/16/22 1016 07/12/22 1001 07/26/22 1029 08/23/22 1022 09/27/22 1029     Core Components/Risk Factors/Patient Goals Review   Personal Goals Review Weight Management/Obesity;Diabetes;Hypertension Weight Management/Obesity;Diabetes;Hypertension;Improve shortness of breath with ADL's;Increase knowledge of respiratory medications and ability to use respiratory devices properly. Weight  Management/Obesity;Diabetes;Hypertension;Improve shortness of breath with ADL's;Increase knowledge of respiratory medications and ability to use respiratory devices properly. Weight Management/Obesity;Diabetes;Hypertension;Improve shortness of breath with ADL's;Increase knowledge of respiratory medications and ability to use respiratory devices properly. Weight Management/Obesity;Diabetes;Hypertension;Improve shortness  of breath with ADL's;Increase knowledge of respiratory medications and ability to use respiratory devices properly.   Review Cedar states that he would like to lose a little weight. He is currently at 267.1 lbs but would like to get down to around 230 lbs. Ankith has been checking his blood sugars at home around once a week and states that they have been within normal ranges. He also reports checking his BP at home and states that it has been within normal ranges as well. Aidenjames's weight has started to creep back up again.  He wants to be more active to help with weight loss but he gets SOB quickly from his afib.  They wants him to try a new medication but it would require him to be in hospital to start and if he misses a dose he would be back in hospital.  He does not want that kind of stress and is trying to learn to live with it.  He is doing well with his inhaler and medications.  He does have oxygen to use at home as needed, but he has not needed it very often.  His pressures have been good and blood sugars have been good too. Orry continues to work on his weight loss.  He is doing better with his diet but he now knows that he needs to move more.  He and his wife are planning to join [facility] after graduation to keep exercising.  His breathing is getting better, but he still gets SOB, which is his biggest limitations.  His sugars are doing well as are pressures.  He continues to use his inhaler  and seems to work well for him. He is also using his nebulizer when he has bad breathing days.  He  does have oxygen for prn when he gets really SOB. Dantre is doing well in rehab. He is frustrated that he is not losing weight.  His breathing is getting better and his pressures are doing well.  He still has SOB but doing well with manageing it.  His inhalers are doing well. His sugars are doing well. Ezra is doing well in rehab. He is nearing graduation.  His weight has come down some with portion control.  He is still getting winded but now thinks it is related to his afib as well as lungs. His wife is staying on top of his pressures and sugars and they check them daily.  He is doing well on his medications.   Expected Outcomes Short: Continue to work towards weight goal. Long: Continue to monitor lifestyle risk factors. Short: Conitnue to work on weight loss by moving more Long: Continue to monitor risk factors Short: conitnue to work on moving more to help with weight loss Long: Continue to monitor risk factors SHort; Conitnue to work on weight loss with portion control Long: Conitnue to monitor risk factors Short: Continue to work on weight loss and breathing Long: continue to montior risk factors            Core Components/Risk Factors/Patient Goals at Discharge (Final Review):   Goals and Risk Factor Review - 09/27/22 1029       Core Components/Risk Factors/Patient Goals Review   Personal Goals Review Weight Management/Obesity;Diabetes;Hypertension;Improve shortness of breath with ADL's;Increase knowledge of respiratory medications and ability to use respiratory devices properly.    Review Howard is doing well in rehab. He is nearing graduation.  His weight has come down some with portion control.  He is still getting  winded but now thinks it is related to his afib as well as lungs. His wife is staying on top of his pressures and sugars and they check them daily.  He is doing well on his medications.    Expected Outcomes Short: Continue to work on weight loss and breathing Long: continue  to montior risk factors             ITP Comments:  ITP Comments     Row Name 05/17/22 1417 05/26/22 1611 06/22/22 1253 07/20/22 0821 08/17/22 0909   ITP Comments Initial phone call completed. Diagnosis can be found in [facility] 1/11. EP Orientation scheduled for Thursday 1/18 at 2pm. Completed and gym orientation. Initial ITP created and sent for review to Dr. Vida Rigger, , Medical Director. 30 day review completed. ITP sent to Dr. Jinny Sanders, Medical Director of  Pulmonary Rehab. Continue with ITP unless changes are made by physician. 30 Day review completed. Medical Director ITP review done, changes made as directed, and signed approval by Medical Director. 30 day review completed. ITP sent to Dr. Jinny Sanders, Medical Director of  Pulmonary Rehab. Continue with ITP unless changes are made by physician.    Row Name 09/14/22 [phone] 10/12/22 0921 10/13/22 1044       ITP Comments 30 Day review completed. Medical Director ITP review done, changes made as directed, and signed approval by Medical Director. 30 Day review completed. Medical Director ITP review done, changes made as directed, and signed approval by Medical Director. Diontae graduated today from  rehab with 36 sessions completed.  Details of the patient's exercise prescription and what He needs to do in order to continue the prescription and progress were discussed with patient.  Patient was given a copy of prescription and goals.  Patient verbalized understanding.  Nalu plans to continue to exercise by using equipment at home..              Comments: Discharge ITP

## 2022-10-13 NOTE — Progress Notes (Signed)
Discharge Note for  Levi [name]     March 02, 1941        Levi [name] graduated today from  rehab with 36 sessions completed.  Details of the Levi [name]'s exercise prescription and what Levi [name] needs to do in order to continue the prescription and progress were discussed with Levi [name].  Levi [name] was given a copy of prescription and goals.  Levi [name] verbalized understanding.  Levi [name] plans to continue to exercise by using equipment at home..    6 Minute Walk     Row Name 05/26/22 1554 09/29/22 1003       6 Minute Walk   Phase Initial Discharge    Distance 890 feet 1100 feet    Distance % Change -- 23.6 %    Distance Feet Change -- 210 ft    Walk Time 6 minutes 6 minutes    # of Rest Breaks 0 0    MPH 1.68 2.08    METS 1.15 1.81    RPE 13 13    Perceived Dyspnea  3 3    VO2 Peak 4.03 6.33    Symptoms Yes (comment) Yes (comment)    Comments Back pain caused from sciatica 9/10, SOB back pain 9/10, SOB    Resting HR 66 bpm 66 bpm    Resting BP 140/76 108/64    Resting Oxygen Saturation  93 % 90 %    Exercise Oxygen Saturation  during 6 min walk 86 % 84 %    Max Ex. HR 94 bpm 112 bpm    Max Ex. BP 148/74 164/86    2 Minute Post BP 136/76 134/70      Interval HR   1 Minute HR 81 --    2 Minute HR 91 --    3 Minute HR 94 --    4 Minute HR 94 --    5 Minute HR 91 --    6 Minute HR 90 --    2 Minute Post HR 93 --    Interval Heart Rate? Yes --      Interval Oxygen   Interval Oxygen? Yes --    Baseline Oxygen Saturation % 93 % --    1 Minute Oxygen Saturation % 91 % --    1 Minute Liters of Oxygen 0 L  RA --    2 Minute Oxygen Saturation % 88 % --    2 Minute Liters of Oxygen 0 L --    3 Minute Oxygen Saturation % 87 % --    3 Minute Liters of Oxygen 0 L --    4 Minute Oxygen Saturation % 88 % --    4 Minute Liters of Oxygen 0 L --    5 Minute Oxygen Saturation % 87 % --    5 Minute Liters of Oxygen 0 L --    6 Minute Oxygen Saturation % 86 % --    6 Minute Liters of Oxygen 0 L --    2  Minute Post Oxygen Saturation % 93 % --    2 Minute Post Liters of Oxygen 0 L --                            Thank you for the referral.

## 2022-10-13 NOTE — Progress Notes (Signed)
Daily Session Note  Patient Details  Name: Levi [name] MRN: [id number] Date of Birth: [birth year] Referring Provider:   Doristine Devoid Pulmonary Rehab from 05/26/2022 in [facility] Cardiac and Pulmonary Rehab  Referring Provider [name] MD       Encounter Date: 10/13/2022  Check In:  Session Check In - 10/13/22 1043       Check-In   Supervising physician immediately available to respond to emergencies See telemetry face sheet for immediately available ER MD    Location [facility]-Cardiac & Pulmonary Rehab    Staff Present Cora Collum, RN, BSN, CCRP;Joseph Hood, RCP,RRT,BSRT;Noah Tickle, [place], Exercise Physiologist    Virtual Visit No    Medication changes reported     No    Fall or balance concerns reported    No    Warm-up and Cool-down Performed on first and last piece of equipment    Resistance Training Performed Yes    VAD Patient? No    PAD/SET Patient? No      Pain Assessment   Currently in Pain? No/denies                Social History   Tobacco Use  Smoking Status Former   Packs/day: 0.25   Years: 62.00   Additional pack years: 0.00   Total pack years: 15.50   Types: Cigarettes   Quit date: 09/05/2018   Years since quitting: 4.1  Smokeless Tobacco Never    Goals Met:  Proper associated with RPD/PD & O2 Sat Independence with exercise equipment Exercise tolerated well Personal goals reviewed No report of concerns or symptoms today  Goals Unmet:  Not Applicable  Comments:  Rafael graduated today from  rehab with 36 sessions completed.  Details of the patient's exercise prescription and what He needs to do in order to continue the prescription and progress were discussed with patient.  Patient was given a copy of prescription and goals.  Patient verbalized understanding.  Dewarren plans to continue to exercise by using equipment at home..    Dr. Bethann Punches is Medical Director for [facility] Cardiac Rehabilitation.  Dr. Vida Rigger is Medical  Director for [facility] Pulmonary Rehabilitation.

## 2022-11-10 ENCOUNTER — Ambulatory Visit: Payer: Medicare Other | Attending: Internal Medicine | Admitting: Internal Medicine

## 2022-11-10 ENCOUNTER — Encounter: Payer: Self-pay | Admitting: Internal Medicine

## 2022-11-10 VITALS — BP 112/68 | HR 54 | Ht 72.0 in | Wt 271.0 lb

## 2022-11-10 DIAGNOSIS — I4821 Permanent atrial fibrillation: Secondary | ICD-10-CM | POA: Diagnosis not present

## 2022-11-10 NOTE — Patient Instructions (Signed)
Medication Instructions:  The current medical regimen is effective;  continue present plan and medications.  *If you need a refill on your cardiac medications before your next appointment, please call your pharmacy*   Follow-Up: At [facility], you and your health needs are our priority.  As part of our continuing mission to provide you with exceptional heart care, we have created designated Provider Care Teams.  These Care Teams include your primary Cardiologist (physician) and Advanced Practice Providers (APPs -  Physician Assistants and Nurse Practitioners) who all work together to provide you with the care you need, when you need it.  We recommend signing up for the patient portal called "MyChart".  Sign up information is provided on this After Visit Summary.  MyChart is used to connect with patients for Virtual Visits (Telemedicine).  Patients are able to view lab/test results, encounter notes, upcoming appointments, etc.  Non-urgent messages can be sent to your provider as well.   To learn more about what you can do with MyChart, go to [email or web address].    Your next appointment:   6 month(s)  Provider:   You will see one of the following Advanced Practice Providers on your designated Care Team:   Sherie Don, NP

## 2022-11-10 NOTE — Progress Notes (Signed)
Patient Care Team: [facility] as PCP - General (General Practice) Debbe Odea, MD as PCP - Cardiology (Cardiology) Lanier Prude, MD as PCP - Electrophysiology (Cardiology) Glory Buff, RN as Registered Nurse Orlie Dakin, Tollie Pizza, MD as Consulting Physician (Oncology) Carmina Miller, MD as Referring Physician (Radiation Oncology) Hulda Marin, MD (Inactive) as Referring Physician (Cardiothoracic Surgery)   HPI  Levi [name] is a 82 y.o. male seen in followup for atrial fibrillation.  Initially diagnosed 2020 contextually with syncope.  At that time  diagnosed with squamous cell lung cancer for which he has been undergoing chemotherapy and radiation therapy in the context of known COPD.  ECG surveillance had demonstrated sinus rhythm as late as 11/22, importantly because his dyspnea precedes the recurrence of atrial fibrillation  He had met with Dr. Merita Norton regarding treatment options which he felt were limited to dronaderone, cardioversion which failed to hold sinus rhythm.  With its discontinuation he has felt much better.  He feels like he is having less A-fib.    Shortness of breath continues to be problematic almost class IV with shortness of breath 20 feet.  He decided initially to try dofetilide and then started pulmonary rehab with improvement in symptoms.  Chose not to pursue dofetilide  Breathing is relatively stable.  No edema.  No chest pain.  Not using oxygen.        DATE TEST EF             10/23  CT   Aortic 3V CA calcification Mod-marked emphysema  10/23  Myoview  55-65% No perfusion defects     Date Cr K Hgb  7/23 1.63 4.0 17.4   9/23     16.7      Records and Results Reviewed   Past Medical History:  Diagnosis Date   Asthma    COPD (chronic obstructive pulmonary disease) (HCC)    Hearing loss    Hypertension    Hypothyroidism    Mass of left lung    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HCC)    Prostate enlargement     Shortness of breath    Squamous cell carcinoma of lung, left (HCC)    Thyroid disease    Tobacco abuse     Past Surgical History:  Procedure Laterality Date   CARDIOVERSION N/A    CARDIOVERSION N/A 11/24/2021   Procedure: CARDIOVERSION;  Surgeon: Yvonne Kendall, MD;  Location: [facility] ORS;  Service: Cardiovascular;  Laterality: N/A;   ELECTROMAGNETIC NAVIGATION BROCHOSCOPY Left 10/19/2018   Procedure: ELECTROMAGNETIC NAVIGATION BRONCHOSCOPY LEFT;  Surgeon: Salena Saner, MD;  Location: [facility] ORS;  Service: Cardiopulmonary;  Laterality: Left;   ENDOBRONCHIAL ULTRASOUND Left 10/19/2018   Procedure: ENDOBRONCHIAL ULTRASOUND LEFT;  Surgeon: Salena Saner, MD;  Location: [facility] ORS;  Service: Cardiopulmonary;  Laterality: Left;   MELANOMA EXCISION Left    NO PAST SURGERIES      Current Meds  Medication Sig   albuterol (PROVENTIL HFA;VENTOLIN HFA) 108 (90 Base) MCG/ACT inhaler Inhale 2 puffs into the lungs every 6 (six) hours as needed for wheezing or shortness of breath.   albuterol (PROVENTIL) (2.5 MG/3ML) 0.083% nebulizer solution Take 2.5 mg by nebulization every 6 (six) hours as needed for wheezing or shortness of breath.   apixaban (ELIQUIS) 5 MG TABS tablet Take 5 mg by mouth 2 (two) times daily.   carboxymethylcellulose (REFRESH PLUS) 0.5 % SOLN 1 drop 4 (four) times daily as needed.  finasteride (PROSCAR) 5 MG tablet Take 5 mg by mouth daily.   fluticasone-salmeterol (ADVAIR) 250-50 MCG/ACT AEPB Inhale 1 puff into the lungs in the morning and at bedtime.   levothyroxine (SYNTHROID) 200 MCG tablet Take 200 mcg by mouth daily before breakfast.   SEMAGLUTIDE,0.25 OR 0.5MG /DOS, [place] Inject 0.5 mg into the skin once a week.   tamsulosin (FLOMAX) 0.4 MG CAPS capsule 0.8 mg daily.   Tiotropium Bromide Monohydrate (SPIRIVA RESPIMAT) 2.5 MCG/ACT AERS Inhale 2 puffs into the lungs daily.   torsemide (DEMADEX) 20 MG tablet Take 1 tablet (20 mg total) by mouth every other day.   vitamin  B-12 (CYANOCOBALAMIN) 1000 MCG tablet Take 1,000 mcg by mouth daily.    No Known Allergies    Review of Systems negative except from HPI and PMH  Physical Exam BP 112/68   Pulse (!) 54   Ht 6' (1.829 m)   Wt 271 lb (122.9 kg)   SpO2 93%   BMI 36.75 kg/m  Well developed and Morbidly obese in no acute distress HENT normal Neck supple   Clear Irregularly Irregular rate and rhythm with controlled  ventricular response  Abd-soft with active BS No Clubbing cyanosis tr edema Skin-warm and dry A & Oriented  Grossly normal sensory and motor function  ECG atrial fibrillation at 54 -/09/41    CrCl cannot be calculated (Patient's most recent lab result is older than the maximum 21 days allowed.).   Assessment and  Plan  Atrial fibrillation-PERMANENT   Dyspnea on exertion   COPD/emphysema-marked   Lung cancer status post chemo/radiation therapy   Syncope   Renal insufficiency grade 3B   Coronary artery disease   Polycythemia    Dyspnea significantly improved through pulmonary rehab.  Edema also better, continue Demadex  Atrial fibrillation we will now designate as permanent  No bleeding.  Continue apixaban.    Current medicines are reviewed at length with the patient today .  The patient does not  have concerns regarding medicines.

## 2022-12-01 ENCOUNTER — Other Ambulatory Visit: Payer: Self-pay | Admitting: Internal Medicine

## 2023-01-10 ENCOUNTER — Ambulatory Visit
Admission: RE | Admit: 2023-01-10 | Discharge: 2023-01-10 | Disposition: A | Discharge status: Home / Self Care | Payer: Medicare Other | Source: Ambulatory Visit | Attending: Student in an Organized Health Care Education/Training Program | Admitting: Student in an Organized Health Care Education/Training Program

## 2023-01-10 ENCOUNTER — Encounter: Payer: Self-pay | Admitting: Student in an Organized Health Care Education/Training Program

## 2023-01-10 ENCOUNTER — Ambulatory Visit: Payer: Medicare Other | Admitting: Student in an Organized Health Care Education/Training Program

## 2023-01-10 VITALS — BP 124/74 | HR 67 | Temp 97.7°F | Resp 24 | Ht 72.0 in | Wt 267.8 lb

## 2023-01-10 DIAGNOSIS — J439 Emphysema, unspecified: Secondary | ICD-10-CM

## 2023-01-10 MED ORDER — PREDNISONE 20 MG PO TABS: 40.0000 mg | ORAL_TABLET | Freq: Every day | ORAL | 0 refills | Status: AC

## 2023-01-10 MED ORDER — AZITHROMYCIN 250 MG PO TABS
ORAL_TABLET | ORAL | 0 refills | Status: AC
Start: 2023-01-10 — End: 2023-01-15

## 2023-01-10 NOTE — Progress Notes (Signed)
Synopsis: Referred in for COPD by [facility]  Assessment & Plan:   1. Pulmonary emphysema, unspecified emphysema type (HCC)  Pleasant 82 year old male presenting with shortness of breath with a history of COPD/Emphysema (GOLD 1B). He is currently on triple therapy with Spiriva Respimat and Advair, and is tolerating it well. He underwent pulmonary rehabilitation with good response. Did have some increase in cough and shortness of breath over the past 2-3 weeks but this has already improved with no signs of systemic illness. Will obtain a CXR today and hold off on initiating COPD treatment given improvement. I have recommended that he continue the current regimen, and continue to follow with cardiology (and monitor his salt intake as he is doing). I will give them a script for Azithromycin and Prednisone to use as needed on their upcoming cruise should he develop an exacerbation. Finally, we discussed weight loss which he is actively working on.   -continue Advair and Spiriva -CXR today -Prednisone and Azithromycin as part of an action plan prior to their upcoming cruise later this week  Return in about 1 year (around 01/10/2024).  I spent 30 minutes caring for this patient today, including preparing to see the patient, obtaining a medical history , reviewing a separately obtained history, performing a medically appropriate examination and/or evaluation, counseling and educating the patient/family/caregiver, ordering medications, tests, or procedures, and documenting clinical information in the electronic health record  [name], MD [facility] 01/10/2023 10:00 AM    End of visit medications:  Meds ordered this encounter  Medications   predniSONE (DELTASONE) 20 MG tablet    Sig: Take 2 tablets (40 mg total) by mouth daily with breakfast for 5 days.    Dispense:  10 tablet    Refill:  0   azithromycin (ZITHROMAX) 250 MG tablet    Sig: Take 2 tablets (500  mg) on  Day 1,  followed by 1 tablet (250 mg) once daily on Days 2 through 5.    Dispense:  6 each    Refill:  0     Current Outpatient Medications:    albuterol (PROVENTIL HFA;VENTOLIN HFA) 108 (90 Base) MCG/ACT inhaler, Inhale 2 puffs into the lungs every 6 (six) hours as needed for wheezing or shortness of breath., Disp: 1 Inhaler, Rfl: 0   albuterol (PROVENTIL) (2.5 MG/3ML) 0.083% nebulizer solution, Take 2.5 mg by nebulization every 6 (six) hours as needed for wheezing or shortness of breath., Disp: , Rfl:    apixaban (ELIQUIS) 5 MG TABS tablet, Take 2.5 mg by mouth 2 (two) times daily., Disp: , Rfl:    azithromycin (ZITHROMAX) 250 MG tablet, Take 2 tablets (500 mg) on  Day 1,  followed by 1 tablet (250 mg) once daily on Days 2 through 5., Disp: 6 each, Rfl: 0   carboxymethylcellulose (REFRESH PLUS) 0.5 % SOLN, 1 drop 4 (four) times daily as needed., Disp: , Rfl:    finasteride (PROSCAR) 5 MG tablet, Take 5 mg by mouth daily., Disp: , Rfl:    fluticasone-salmeterol (ADVAIR) 250-50 MCG/ACT AEPB, Inhale 1 puff into the lungs in the morning and at bedtime., Disp: , Rfl:    levothyroxine (SYNTHROID) 200 MCG tablet, Take 200 mcg by mouth daily before breakfast., Disp: , Rfl:    predniSONE (DELTASONE) 20 MG tablet, Take 2 tablets (40 mg total) by mouth daily with breakfast for 5 days., Disp: 10 tablet, Rfl: 0   SEMAGLUTIDE,0.25 OR 0.5MG /DOS, [place], Inject 0.5 mg  into the skin once a week., Disp: , Rfl:    tamsulosin (FLOMAX) 0.4 MG CAPS capsule, 0.8 mg daily., Disp: , Rfl:    Tiotropium Bromide Monohydrate (SPIRIVA RESPIMAT) 2.5 MCG/ACT AERS, Inhale 2 puffs into the lungs daily., Disp: 1 each, Rfl: 11   torsemide (DEMADEX) 20 MG tablet, TAKE 1 TABLET BY MOUTH EVERY OTHER DAY, Disp: 45 tablet, Rfl: 2   vitamin B-12 (CYANOCOBALAMIN) 1000 MCG tablet, Take 1,000 mcg by mouth daily., Disp: , Rfl:    Subjective:   PATIENT ID: Levi [name] GENDER: male DOB: January 30, 1941, MRN: [id number]  Chief  Complaint  Patient presents with   Follow-up    Patient reports cough, shortness of breath and wheezing in the last couple of days. Patient reports good compliance and tolerance to medications.     HPI  Levi [name] is a pleasant 82 year old male presenting to clinic for follow up of COPD.   Patient was previously seen in clinic and we broadened his inhaler therapy to LABA/LAMA/ICS (advair and spiriva respimat). He was also referred to pulmonary rehabilitation which he undertook with good success and improvement in symptoms. He's been able to do more following enrolment (such as going shopping with his wife, unloading groceries, walking, etc...). He feels that his back pain is currently limiting his ability to be active. He had been working on weight loss since prior to our last visit with success. He did have an increase in cough over the past 2-3 weeks but this has already started to improve. He has not had any exacerbations since the time he saw me last and has not required any prednisone or antibiotics. They are taking a cruise later this week starting Friday.   PFT's performed 04/22/2022 showed FEV1/FVC at 59% and FEV1 at 69% predicted, post bronchodilator FEV1 was 84% predicted.   He denies any wheezing, chest pain, or chest tightness. His cough is productive of whitish sputum, and he denies a change in the nature of the sputum, and denies any hemoptysis.   He quit smoking around the time of his diagnosis with lung cancer. At that time, he underwent navigational bronchoscopy with biopsies. 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 He reports some lower extremity edema, worse at the end of the day. He reports having to go up every 2 hours at night to pee. He is taking tamsulosin for BPH. He does not have any vision problems. He has not had any worsening of his BPH with the initiation of the Spiriva. He is hard of hearing. He has a  history of Afib for which he is followed by cardiology. He feels that some of his symptoms are due to his afib.   He did smoke for a long time, with at least 50 to 60 pack years on him. 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 He is currently retired.   Ancillary information including prior medications, full medical/surgical/family/social histories, and PFTs (when available) are listed below and have been reviewed.   ROS   Objective:   Vitals:   01/10/23 0943  BP: 124/74  Pulse: 67  Resp: (!) 24  Temp: 97.7 F (36.5 C)  TempSrc: Temporal  SpO2: 93%  Weight: 267 lb 12.8 oz (121.5 kg)  Height: 6' (1.829 m)  93% on RA  BMI Readings from Last 3 Encounters:  01/10/23 36.32 kg/m  11/10/22 36.75 kg/m  09/29/22 37.50 kg/m   Wt Readings from Last 3 Encounters:  01/10/23 267 lb 12.8 oz (121.5 kg)  11/10/22 271 lb (122.9 kg)  09/29/22 268 lb 14.4 oz (122 kg)    Physical Exam    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HCC)    Prostate enlargement    Shortness of breath    Squamous cell carcinoma of lung, left (HCC)    Thyroid disease    Tobacco abuse      Family History  Problem Relation Age of Onset   Aneurysm Mother    Cancer Father      Past Surgical History:  Procedure Laterality Date   CARDIOVERSION N/A    CARDIOVERSION N/A 11/24/2021   Procedure: CARDIOVERSION;  Surgeon: Yvonne Kendall, MD;  Location: [facility] ORS;  Service: Cardiovascular;  Laterality: N/A;   ELECTROMAGNETIC NAVIGATION BROCHOSCOPY Left 10/19/2018   Procedure: ELECTROMAGNETIC NAVIGATION BRONCHOSCOPY LEFT;  Surgeon: Salena Saner, MD;   Location: [facility] ORS;  Service: Cardiopulmonary;  Laterality: Left;   ENDOBRONCHIAL ULTRASOUND Left 10/19/2018   Procedure: ENDOBRONCHIAL ULTRASOUND LEFT;  Surgeon: Salena Saner, MD;  Location: [facility] ORS;  Service: Cardiopulmonary;  Laterality: Left;   MELANOMA EXCISION Left    NO PAST SURGERIES      Social History   Socioeconomic History   Marital status: Married    Spouse name: Olegario Messier   Number of children: 5   Years of education: Not on file   Highest education level: Not on file  Occupational History   Not on file  Tobacco Use   Smoking status: Former    Current packs/day: 0.00    Average packs/day: 0.3 packs/day for 62.0 years (15.5 ttl pk-yrs)    Types: Cigarettes    Start date: 09/04/1956    Quit date: 09/05/2018    Years since quitting: 4.3   Smokeless tobacco: Never  Vaping Use   Vaping status: Never Used  Substance and Sexual Activity   Alcohol use: No    Alcohol/week: 0.0 standard drinks of alcohol   Drug use: No   Sexual activity: Not on file  Other Topics Concern   Not on file  Social History Narrative   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 He is married to his wife of 30+ years, Olegario Messier and has 5 children (1 deceased), many grand children, and one great grand child.    Social Determinants of Health   Financial Resource Strain: Low Risk  (12/04/2018)   Overall Financial Resource Strain (CARDIA)    Difficulty of Paying Living Expenses: Not very hard  Food Insecurity: No Food Insecurity (12/04/2018)   Hunger Vital Sign    Worried About Running Out of Food in the Last Year: Never true    Ran Out of Food in the Last Year: Never true  Transportation Needs: No Transportation Needs (12/04/2018)   PRAPARE - Administrator, Civil Service (Medical): No    Lack of Transportation (Non-Medical): No  Physical Activity: Not on file  Stress: Stress Concern Present (12/04/2018)   [employer] of Occupational Health - Occupational Stress Questionnaire    Feeling of Stress : Very much  Social Connections: Unknown (12/04/2018)   Social Connection and Isolation Panel [NHANES]    Frequency of Communication with Friends and Family: More than three times a week    Frequency of Social Gatherings with Friends and Family: Once a week    Attends Religious Services: Not on file    Active Member of Clubs or Organizations: Not on file    Attends Banker Meetings: Not on file    Marital Status: Not on file  Intimate Partner Violence: Not on file     No Known Allergies   CBC    Component Value Date/Time   WBC 8.2 01/05/2022 1204   RBC 5.62 01/05/2022 1204   HGB 16.7 01/05/2022 1204   HCT 51.7 01/05/2022 1204   PLT 197 01/05/2022 1204   MCV 92.0 01/05/2022 1204   MCV 90.0 10/20/2015 1020   MCH 29.7 01/05/2022 1204   MCHC 32.3 01/05/2022 1204   RDW 14.3 01/05/2022 1204   LYMPHSABS 1.5 01/05/2022 1204   MONOABS 0.8 01/05/2022 1204   EOSABS 0.4 01/05/2022 1204   BASOSABS 0.1 01/05/2022 1204    Pulmonary Functions Testing Results:    Latest Ref Rng & Units 04/22/2022   12:02 PM  PFT Results  FVC-Pre L 3.75   FVC-Predicted Pre % 85   FVC-Post L 4.15   FVC-Predicted Post % 93   Pre FEV1/FVC % % 59   Post FEV1/FCV % % 64   FEV1-Pre L 2.20   FEV1-Predicted Pre % 69   FEV1-Post L 2.66   DLCO uncorrected ml/min/mmHg 11.02   DLCO UNC% % 42   DLVA Predicted % 47   TLC L 6.33   TLC % Predicted % 84   RV % Predicted % 78     Outpatient Medications Prior to Visit  Medication Sig Dispense Refill   albuterol (PROVENTIL HFA;VENTOLIN HFA) 108 (90 Base) MCG/ACT inhaler Inhale 2 puffs into the lungs every 6 (six) hours as needed for wheezing or shortness of breath. 1 Inhaler 0   albuterol (PROVENTIL) (2.5 MG/3ML) 0.083% nebulizer solution Take 2.5 mg by nebulization every 6  (six) hours as needed for wheezing or shortness of breath.     apixaban (ELIQUIS) 5 MG TABS tablet Take 2.5 mg by mouth 2 (two) times daily.     carboxymethylcellulose (REFRESH PLUS) 0.5 % SOLN 1 drop 4 (four) times daily as needed.     finasteride (PROSCAR) 5 MG tablet Take 5 mg by mouth daily.     fluticasone-salmeterol (ADVAIR) 250-50 MCG/ACT AEPB Inhale 1 puff into the lungs in the morning and at bedtime.     levothyroxine (SYNTHROID) 200 MCG tablet Take 200 mcg by mouth daily before breakfast.     SEMAGLUTIDE,0.25 OR 0.5MG /DOS, [place] Inject 0.5 mg into the skin once a week.     tamsulosin (FLOMAX) 0.4 MG CAPS capsule 0.8 mg daily.     Tiotropium Bromide Monohydrate (SPIRIVA RESPIMAT) 2.5 MCG/ACT AERS Inhale 2 puffs into the lungs daily. 1 each 11   torsemide (DEMADEX) 20 MG tablet TAKE 1 TABLET BY MOUTH EVERY OTHER DAY 45 tablet 2   vitamin B-12 (CYANOCOBALAMIN) 1000 MCG tablet Take 1,000 mcg by mouth daily.     No facility-administered medications prior to visit.

## 2023-02-03 ENCOUNTER — Telehealth: Payer: Self-pay | Admitting: *Deleted

## 2023-02-03 ENCOUNTER — Ambulatory Visit
Admission: RE | Admit: 2023-02-03 | Discharge: 2023-02-03 | Disposition: A | Discharge status: Home / Self Care | Payer: No Typology Code available for payment source | Source: Ambulatory Visit | Attending: Oncology | Admitting: Oncology

## 2023-02-03 DIAGNOSIS — C3492 Malignant neoplasm of unspecified part of left bronchus or lung: Secondary | ICD-10-CM | POA: Insufficient documentation

## 2023-02-03 LAB — POCT I-STAT CREATININE: Creatinine, Ser: 1.5 mg/dL — ABNORMAL HIGH (ref 0.61–1.24)

## 2023-02-03 MED ORDER — IOHEXOL 300 MG/ML  SOLN
75.0000 mL | Freq: Once | INTRAMUSCULAR | Status: AC | PRN
  Administered 2023-02-03: 75 mL via INTRAVENOUS

## 2023-02-03 NOTE — Telephone Encounter (Signed)
CAlled report  IMPRESSION: 1. New, large bulky left hilar mass. 2. New, moderate left pleural effusion with extensive pleural nodularity throughout the left hemithorax. 3. New, cavitary nodule of the right pulmonary apex. 4. Numerous newly enlarged mediastinal and left hilar lymph nodes. 5. Constellation of findings is consistent with recurrent left hilar malignancy with pleural, nodal, and contralateral pulmonary metastatic disease. 6. Severe emphysema and diffuse bilateral bronchial wall thickening. 7. Coronary artery disease. 8. Hepatic steatosis.   These results will be called to the ordering clinician or representative by the Radiologist Assistant, and communication documented in the PACS or [employer].   Aortic Atherosclerosis (ICD10-I70.0) and Emphysema (ICD10-J43.9).     Electronically Signed   By: Jearld Lesch M.D.   On: 02/03/2023 12:08

## 2023-02-07 ENCOUNTER — Encounter: Payer: Self-pay | Admitting: Oncology

## 2023-02-07 ENCOUNTER — Inpatient Hospital Stay: Payer: Medicare Other | Attending: Oncology | Admitting: Oncology

## 2023-02-07 VITALS — BP 154/84 | HR 67 | Temp 98.7°F | Resp 20 | Ht 72.0 in | Wt 272.0 lb

## 2023-02-07 DIAGNOSIS — Z809 Family history of malignant neoplasm, unspecified: Secondary | ICD-10-CM | POA: Diagnosis not present

## 2023-02-07 DIAGNOSIS — N289 Disorder of kidney and ureter, unspecified: Secondary | ICD-10-CM | POA: Diagnosis not present

## 2023-02-07 DIAGNOSIS — C3490 Malignant neoplasm of unspecified part of unspecified bronchus or lung: Secondary | ICD-10-CM

## 2023-02-07 DIAGNOSIS — C3492 Malignant neoplasm of unspecified part of left bronchus or lung: Secondary | ICD-10-CM

## 2023-02-07 DIAGNOSIS — Z85820 Personal history of malignant melanoma of skin: Secondary | ICD-10-CM | POA: Insufficient documentation

## 2023-02-07 DIAGNOSIS — C3412 Malignant neoplasm of upper lobe, left bronchus or lung: Secondary | ICD-10-CM | POA: Diagnosis present

## 2023-02-07 DIAGNOSIS — R059 Cough, unspecified: Secondary | ICD-10-CM | POA: Insufficient documentation

## 2023-02-07 DIAGNOSIS — F419 Anxiety disorder, unspecified: Secondary | ICD-10-CM | POA: Insufficient documentation

## 2023-02-07 DIAGNOSIS — R0602 Shortness of breath: Secondary | ICD-10-CM | POA: Insufficient documentation

## 2023-02-07 DIAGNOSIS — F32A Depression, unspecified: Secondary | ICD-10-CM | POA: Diagnosis not present

## 2023-02-07 DIAGNOSIS — Z87891 Personal history of nicotine dependence: Secondary | ICD-10-CM | POA: Insufficient documentation

## 2023-02-07 NOTE — Progress Notes (Signed)
[facility]  [phone]) [phone] [phone]) [phone]  ID: Levi [name] OB: November 21, 1940  MR#: [id number]  UUV#:[id number]  Patient Care Team: [facility] as PCP - General (General Practice) Debbe Odea, MD as PCP - Cardiology (Cardiology) Lanier Prude, MD as PCP - Electrophysiology (Cardiology) Glory Buff, RN as Registered Nurse Orlie Dakin, Tollie Pizza, MD as Consulting Physician (Oncology) Carmina Miller, MD as Referring Physician (Radiation Oncology) Hulda Marin, MD (Inactive) as Referring Physician (Cardiothoracic Surgery)  CHIEF COMPLAINT: Clinical stage IIB squamous cell carcinoma, left upper lobe lung.  INTERVAL HISTORY: Patient returns to clinic today for routine yearly evaluation and discussion of his imaging results.  Over the past several weeks, patient has noticed an increased fatigue, cough, and shortness of breath.  His symptoms did not resolve despite treatment with antibiotics and prednisone.  He continues to be anxious. He has no neurologic complaints.  He denies any recent fevers or illnesses.  He denies any chest pain or hemoptysis.  He has a good appetite and denies weight loss.  He denies any nausea, vomiting, constipation, or diarrhea.  He has no urinary complaints.  Patient offers no further specific complaints today.  REVIEW OF SYSTEMS:   Review of Systems  Constitutional:  Positive for malaise/fatigue. Negative for fever and weight loss.  HENT: Negative.  Negative for congestion.   Respiratory:  Positive for cough and shortness of breath. Negative for hemoptysis.   Cardiovascular: Negative.  Negative for chest pain and leg swelling.  Gastrointestinal: Negative.  Negative for abdominal pain and nausea.  Genitourinary: Negative.  Negative for dysuria.  Musculoskeletal: Negative.  Negative for back pain.  Skin:  Negative for rash.  Neurological: Negative.  Negative for dizziness, focal weakness, weakness and headaches.   Endo/Heme/Allergies:  Does not bruise/bleed easily.  Psychiatric/Behavioral:  Negative for depression. The patient is nervous/anxious.     As per HPI. Otherwise, a complete review of systems is negative.  PAST MEDICAL HISTORY: Past Medical History:  Diagnosis Date   Asthma    COPD (chronic obstructive pulmonary disease) (HCC)    Hearing loss    Hypertension    Hypothyroidism    Mass of left lung    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HCC)    Prostate enlargement    Shortness of breath    Squamous cell carcinoma of lung, left (HCC)    Thyroid disease    Tobacco abuse     PAST SURGICAL HISTORY: Past Surgical History:  Procedure Laterality Date   CARDIOVERSION N/A    CARDIOVERSION N/A 11/24/2021   Procedure: CARDIOVERSION;  Surgeon: Yvonne Kendall, MD;  Location: [facility] ORS;  Service: Cardiovascular;  Laterality: N/A;   ELECTROMAGNETIC NAVIGATION BROCHOSCOPY Left 10/19/2018   Procedure: ELECTROMAGNETIC NAVIGATION BRONCHOSCOPY LEFT;  Surgeon: Salena Saner, MD;  Location: [facility] ORS;  Service: Cardiopulmonary;  Laterality: Left;   ENDOBRONCHIAL ULTRASOUND Left 10/19/2018   Procedure: ENDOBRONCHIAL ULTRASOUND LEFT;  Surgeon: Salena Saner, MD;  Location: [facility] ORS;  Service: Cardiopulmonary;  Laterality: Left;   MELANOMA EXCISION Left    NO PAST SURGERIES      FAMILY HISTORY: Family History  Problem Relation Age of Onset   Aneurysm Mother    Cancer Father     ADVANCED DIRECTIVES (Y/N):  N  HEALTH MAINTENANCE: Social History   Tobacco Use   Smoking status: Former    Current packs/day: 0.00    Average packs/day: 0.3 packs/day for 62.0 years (15.5 ttl pk-yrs)    Types: Cigarettes  Start date: 09/04/1956    Quit date: 09/05/2018    Years since quitting: 4.4   Smokeless tobacco: Never  Vaping Use   Vaping status: Never Used  Substance Use Topics   Alcohol use: No    Alcohol/week: 0.0 standard drinks of alcohol   Drug use: No     Colonoscopy:  PAP:  Bone  density:  Lipid panel:  No Known Allergies  Current Outpatient Medications  Medication Sig Dispense Refill   albuterol (PROVENTIL HFA;VENTOLIN HFA) 108 (90 Base) MCG/ACT inhaler Inhale 2 puffs into the lungs every 6 (six) hours as needed for wheezing or shortness of breath. 1 Inhaler 0   albuterol (PROVENTIL) (2.5 MG/3ML) 0.083% nebulizer solution Take 2.5 mg by nebulization every 6 (six) hours as needed for wheezing or shortness of breath.     apixaban (ELIQUIS) 5 MG TABS tablet Take 2.5 mg by mouth 2 (two) times daily.     carboxymethylcellulose (REFRESH PLUS) 0.5 % SOLN 1 drop 4 (four) times daily as needed.     finasteride (PROSCAR) 5 MG tablet Take 5 mg by mouth daily.     fluticasone-salmeterol (ADVAIR) 250-50 MCG/ACT AEPB Inhale 1 puff into the lungs in the morning and at bedtime.     levothyroxine (SYNTHROID) 200 MCG tablet Take 200 mcg by mouth daily before breakfast.     SEMAGLUTIDE,0.25 OR 0.5MG /DOS, [place] Inject 0.5 mg into the skin once a week.     tamsulosin (FLOMAX) 0.4 MG CAPS capsule 0.8 mg daily.     Tiotropium Bromide Monohydrate (SPIRIVA RESPIMAT) 2.5 MCG/ACT AERS Inhale 2 puffs into the lungs daily. 1 each 11   torsemide (DEMADEX) 20 MG tablet TAKE 1 TABLET BY MOUTH EVERY OTHER DAY 45 tablet 2   vitamin B-12 (CYANOCOBALAMIN) 1000 MCG tablet Take 1,000 mcg by mouth daily.     No current facility-administered medications for this visit.    OBJECTIVE: Vitals:   02/07/23 1052  BP: (!) 154/84  Pulse: 67  Resp: 20  Temp: 98.7 F (37.1 C)  SpO2: 93%     Body mass index is 36.89 kg/m.    ECOG FS:1 - Symptomatic but completely ambulatory  General: Well-developed, well-nourished, no acute distress. Eyes: Pink conjunctiva, anicteric sclera. HEENT: Normocephalic, moist mucous membranes. Lungs: No audible wheezing or coughing. Heart: Regular rate and rhythm. Abdomen: Soft, nontender, no obvious distention. Musculoskeletal: No edema, cyanosis, or clubbing. Neuro: Alert,  answering all questions appropriately. Cranial nerves grossly intact. Skin: No rashes or petechiae noted. Psych: Normal affect.  LAB RESULTS:  Lab Results  Component Value Date   NA 138 11/03/2021   K 4.0 11/03/2021   CL 110 11/03/2021   CO2 19 (L) 11/03/2021   GLUCOSE 84 11/03/2021   BUN 24 (H) 11/03/2021   CREATININE 1.50 (H) 02/03/2023   CALCIUM 9.0 11/03/2021   PROT 6.4 (L) 07/18/2019   ALBUMIN 3.7 07/18/2019   AST 29 07/18/2019   ALT 35 07/18/2019   ALKPHOS 51 07/18/2019   BILITOT 0.8 07/18/2019   GFRNONAA 43 (L) 11/03/2021   GFRAA 58 (L) 07/18/2019    Lab Results  Component Value Date   WBC 8.2 01/05/2022   NEUTROABS 5.4 01/05/2022   HGB 16.7 01/05/2022   HCT 51.7 01/05/2022   MCV 92.0 01/05/2022   PLT 197 01/05/2022     STUDIES: CT Chest W Contrast  Result Date: 02/03/2023 CLINICAL DATA:  Left lung cancer restaging * Tracking Code: BO * EXAM: CT CHEST WITH CONTRAST TECHNIQUE: Multidetector CT imaging  of the chest was performed during intravenous contrast administration. RADIATION DOSE REDUCTION: This exam was performed according to the departmental dose-optimization program which includes automated exposure control, adjustment of the mA and/or kV according to patient size and/or use of iterative reconstruction technique. CONTRAST:  75mL OMNIPAQUE IOHEXOL 300 MG/ML  SOLN COMPARISON:  Chest radiograph, 01/10/2023, CT chest, 01/31/2022 FINDINGS: Cardiovascular: Aortic atherosclerosis. Mild cardiomegaly. Left and right coronary artery calcifications no pericardial effusion. Mediastinum/Nodes: Numerous newly enlarged mediastinal and left hilar lymph nodes, largest measuring 2.3 x 1.7 cm (series 2, image 59). Thyroid gland, trachea, and esophagus demonstrate no significant findings. Lungs/Pleura: Severe emphysema and diffuse bilateral bronchial wall thickening. New, large bulky left hilar mass measuring 7.3 x 6.0 cm (series 2, image 61). New, moderate left pleural effusion  with extensive pleural nodularity throughout the left hemithorax, largest index nodule about the medial left lower hemithorax measures 3.8 x 3.3 cm (series 2, image 113). New, cavitary nodule of the right pulmonary apex measuring 1.6 x 1.5 cm (series 4, image 31). Upper Abdomen: No acute abnormality.  Hepatic steatosis. Musculoskeletal: No chest wall abnormality. No acute osseous findings. Disc degenerative disease and bridging osteophytosis throughout the thoracic spine. IMPRESSION: 1. New, large bulky left hilar mass. 2. New, moderate left pleural effusion with extensive pleural nodularity throughout the left hemithorax. 3. New, cavitary nodule of the right pulmonary apex. 4. Numerous newly enlarged mediastinal and left hilar lymph nodes. 5. Constellation of findings is consistent with recurrent left hilar malignancy with pleural, nodal, and contralateral pulmonary metastatic disease. 6. Severe emphysema and diffuse bilateral bronchial wall thickening. 7. Coronary artery disease. 8. Hepatic steatosis. These results will be called to the ordering clinician or representative by the Radiologist Assistant, and communication documented in the PACS or [employer]. Aortic Atherosclerosis (ICD10-I70.0) and Emphysema (ICD10-J43.9). Electronically Signed   By: Jearld Lesch M.D.   On: 02/03/2023 12:08   DG Chest 2 View  Result Date: 01/21/2023 CLINICAL DATA:  Increased cough. EXAM: CHEST - 2 VIEW COMPARISON:  03/11/2021 and CT of the chest on 01/31/2022 FINDINGS: There is persistent opacity in the LEFT UPPER lobe, consistent with known post treatment changes. There is pleural thickening bilaterally, LEFT greater than RIGHT. There are no new consolidations. No evidence for pulmonary edema. Visualized osseous structures have a normal appearance. IMPRESSION: 1. Post treatment changes in the LEFT UPPER lobe. 2. Bilateral pleural thickening. No evidence for acute abnormality. Electronically Signed   By: Norva Pavlov  M.D.   On: 01/21/2023 09:13     ASSESSMENT: Clinical stage IIB squamous cell carcinoma, left upper lobe lung.  PLAN:    Clinical stage IIB squamous cell carcinoma, left upper lobe lung: PET scan results from October 08, 2018 reviewed independently.  Patient underwent biopsy with navigational bronchoscopy on October 19, 2018 confirming diagnosis.  Patient declined surgery and wished to pursue XRT along with concurrent chemotherapy.  Given his stage of disease he did not receive maintenance immunotherapy.  He completed cycle 7 of weekly carboplatinum and Taxol on January 09, 2019 and then completed XRT on January 14, 2019.  His most recent CT scans from February 03, 2023 reviewed independently and reported as above with a new large bulky left hilar mass measuring 7.3 x 6.0 cm.  He is also noted to have a moderate left pleural effusion with extensive pleural nodularity as well as a new cavitary nodule on the right lung measuring 1.6 cm.  Unclear if this is recurrence or a new primary.  Patient  will have a PET scan and an MRI to complete the staging process.  A referral has also been sent back to pulmonology for biopsy.  Patient will return to clinic in approximately 2 weeks at the completion of all the studies to discuss the results and treatment plan. History of melanoma: Unclear stage or depth, although by report was in situ.  Patient had Mohs surgery in fall 2019.  Previous lung biopsy consistent with squamous cell carcinoma. Anxiety/depression: Continue current medications as prescribed.  Continue follow-up with primary care physician as well as psychiatry at the [place]. Renal insufficiency: Chronic and unchanged.  Patient's most recent creatinine is 1.50.  Shortness of breath/cough: Likely secondary to hilar mass seen on CT scan.  Patient expressed understanding and was in agreement with this plan. He also understands that He can call clinic at any time with any questions, concerns, or complaints.    Cancer  Staging  Squamous cell carcinoma lung, left (HCC) Staging form: Lung, AJCC 8th Edition - Clinical stage from 10/27/2018: Stage IIB (cT1c, cN1, cM0) - Signed by Jeralyn Ruths, MD on 11/16/2018   Jeralyn Ruths, MD   02/07/2023 11:16 AM

## 2023-02-07 NOTE — Progress Notes (Signed)
Has a dry cough, chest pain with coughing, more SOB than usual. Feels the same way he did when he had pneumonia in the past.

## 2023-02-13 ENCOUNTER — Ambulatory Visit: Payer: Medicare Other | Admitting: Student in an Organized Health Care Education/Training Program

## 2023-02-13 ENCOUNTER — Encounter: Payer: Self-pay | Admitting: Student in an Organized Health Care Education/Training Program

## 2023-02-13 VITALS — BP 132/78 | HR 80 | Temp 97.6°F | Ht 72.0 in | Wt 271.8 lb

## 2023-02-13 DIAGNOSIS — J439 Emphysema, unspecified: Secondary | ICD-10-CM | POA: Diagnosis not present

## 2023-02-13 DIAGNOSIS — C3492 Malignant neoplasm of unspecified part of left bronchus or lung: Secondary | ICD-10-CM

## 2023-02-13 DIAGNOSIS — J910 Malignant pleural effusion: Secondary | ICD-10-CM | POA: Diagnosis not present

## 2023-02-14 ENCOUNTER — Ambulatory Visit
Admission: RE | Admit: 2023-02-14 | Discharge: 2023-02-14 | Disposition: A | Discharge status: Home / Self Care | Payer: Medicare Other | Source: Ambulatory Visit | Attending: Oncology | Admitting: Oncology

## 2023-02-14 DIAGNOSIS — C3492 Malignant neoplasm of unspecified part of left bronchus or lung: Secondary | ICD-10-CM | POA: Diagnosis present

## 2023-02-14 DIAGNOSIS — C3490 Malignant neoplasm of unspecified part of unspecified bronchus or lung: Secondary | ICD-10-CM

## 2023-02-14 DIAGNOSIS — C782 Secondary malignant neoplasm of pleura: Secondary | ICD-10-CM | POA: Insufficient documentation

## 2023-02-14 DIAGNOSIS — R918 Other nonspecific abnormal finding of lung field: Secondary | ICD-10-CM | POA: Diagnosis not present

## 2023-02-14 DIAGNOSIS — C771 Secondary and unspecified malignant neoplasm of intrathoracic lymph nodes: Secondary | ICD-10-CM | POA: Diagnosis not present

## 2023-02-14 DIAGNOSIS — J9819 Other pulmonary collapse: Secondary | ICD-10-CM | POA: Insufficient documentation

## 2023-02-14 DIAGNOSIS — J439 Emphysema, unspecified: Secondary | ICD-10-CM | POA: Insufficient documentation

## 2023-02-14 DIAGNOSIS — I700 Atherosclerosis of aorta: Secondary | ICD-10-CM | POA: Diagnosis not present

## 2023-02-14 LAB — GLUCOSE, CAPILLARY: Glucose-Capillary: 122 mg/dL — ABNORMAL HIGH (ref 70–99)

## 2023-02-14 MED ORDER — FLUDEOXYGLUCOSE F - 18 (FDG) INJECTION
12.0000 | Freq: Once | INTRAVENOUS | Status: AC | PRN
  Administered 2023-02-14: 13.19 via INTRAVENOUS

## 2023-02-14 MED ORDER — GADOBUTROL 1 MMOL/ML IV SOLN
10.0000 mL | Freq: Once | INTRAVENOUS | Status: AC | PRN
  Administered 2023-02-14: 10 mL via INTRAVENOUS

## 2023-02-14 NOTE — Progress Notes (Signed)
Assessment & Plan:   #Squamous cell carcinoma lung, left (HCC) #Malignant pleural effusion.  Presenting for evaluation of findings on CT that are consistent with recurrent lung cancer, radiographically appears to be stage IV. I did perform point of care US of the lung at the bedside. This does show a moderate sized pleural effusion as well as pleural based masses that were identifiable. His imaging is also notable for a left hilar mass, mediastinal lymphadenopathy, and a RUL nodule. I did discuss these findings with the patient and his wife and they understand that he's had a recurrence of his cancer.  As to the question of biopsy, I suspect that the pleural based masses could be biopsied under [place] or CT guidance, without the need for general anesthesia. I will reach out to interventional radiology and oncology to ensure this would be appropriate from a tissue acquisition perspective. Should this not be the case, we would proceed with EBUS and robotic assisted navigational bronchoscopy to establish the diagnosis.   Finally, patient has a pleural effusion that is likely malignant. This is likely contributing to his symptoms as well and he would benefit from thoracentesis. Given he's on Apixaban, I will not be able to do it in clinic today. I will place a referral to IR for thoracentesis as well as pleural mass biopsy.  - US THORACENTESIS ASP PLEURAL SPACE W/IMG GUIDE; Future - US Guided Needle Placement; Future  #Pulmonary emphysema, unspecified emphysema type (HCC)  Levi [name] has a history of COPD/Emphysema (GOLD 1B). He is currently on triple therapy with Spiriva Respimat and Advair, and is tolerating it well. He underwent pulmonary rehabilitation with good response. I have recommended that he continue the current regimen, and continue to follow with cardiology (and monitor his salt intake as he is doing). I will give them a script for Azithromycin and Prednisone to use as needed on their cruise  which he's used without any effect on his symptoms, leading me to suspect this is mostly driven by the pleural effusion.  -continue Advair and Spiriva  Return in about 6 months (around 08/14/2023).  I spent 50 minutes caring for this patient today, including preparing to see the patient, obtaining a medical history , reviewing a separately obtained history, performing a medically appropriate examination and/or evaluation, counseling and educating the patient/family/caregiver, ordering medications, tests, or procedures, referring and communicating with other health care professionals (not separately reported), documenting clinical information in the electronic health record, independently interpreting results (not separately reported/billed) and communicating results to the patient/family/caregiver, and care coordination (not separately reported/billed)  [name], MD [name]   End of visit medications:  No orders of the defined types were placed in this encounter.    Current Outpatient Medications:    albuterol (PROVENTIL HFA;VENTOLIN HFA) 108 (90 Base) MCG/ACT inhaler, Inhale 2 puffs into the lungs every 6 (six) hours as needed for wheezing or shortness of breath., Disp: 1 Inhaler, Rfl: 0   albuterol (PROVENTIL) (2.5 MG/3ML) 0.083% nebulizer solution, Take 2.5 mg by nebulization every 6 (six) hours as needed for wheezing or shortness of breath., Disp: , Rfl:    apixaban (ELIQUIS) 5 MG TABS tablet, Take 2.5 mg by mouth 2 (two) times daily., Disp: , Rfl:    carboxymethylcellulose (REFRESH PLUS) 0.5 % SOLN, 1 drop 4 (four) times daily as needed., Disp: , Rfl:    finasteride (PROSCAR) 5 MG tablet, Take 5 mg by mouth daily., Disp: , Rfl:    fluticasone-salmeterol (ADVAIR) 250-50  MCG/ACT AEPB, Inhale 1 puff into the lungs in the morning and at bedtime., Disp: , Rfl:    levothyroxine (SYNTHROID) 200 MCG tablet, Take 200 mcg by mouth daily before breakfast., Disp: , Rfl:     SEMAGLUTIDE,0.25 OR 0.5MG /DOS, [place], Inject 0.5 mg into the skin once a week., Disp: , Rfl:    tamsulosin (FLOMAX) 0.4 MG CAPS capsule, 0.8 mg daily., Disp: , Rfl:    Tiotropium Bromide Monohydrate (SPIRIVA RESPIMAT) 2.5 MCG/ACT AERS, Inhale 2 puffs into the lungs daily., Disp: 1 each, Rfl: 11   torsemide (DEMADEX) 20 MG tablet, TAKE 1 TABLET BY MOUTH EVERY OTHER DAY, Disp: 45 tablet, Rfl: 2   vitamin B-12 (CYANOCOBALAMIN) 1000 MCG tablet, Take 1,000 mcg by mouth daily., Disp: , Rfl:    Subjective:   PATIENT ID: Levi [name] GENDER: male DOB: 10/05/1940, MRN: [id number]  Chief Complaint  Patient presents with   Follow-up    Increased SOB, prod cough with clear to grey sputum and wheezing.     HPI  Levi [name] is a Levi [name] 82 year old male presenting to clinic for follow up.   Patient was previously seen in clinic and we broadened his inhaler therapy to LABA/LAMA/ICS (advair and spiriva respimat). He was also referred to pulmonary rehabilitation which he undertook with good success and improvement in symptoms. He's been able to do more following enrolment (such as going shopping with his wife, unloading groceries, walking, etc...).   He has had worsening of his shortness of breath over the last couple of weeks and was recently seen by his oncologist Dr. Orlie Dakin.  Patient underwent restaging CT in October 2024 which returned with findings that are highly concerning for recurrence of his malignancy.  His chest CT is showing a left-sided pleural effusion, pleural nodularity throughout the left hemithorax, a large bulky left hilar mass, enlarged mediastinal and hilar lymph nodes, and the new cavitary nodule in the right pulmonary apex.  He is presenting today to discuss these findings and for consideration of biopsy. He is reporting shortness of breath with exertion, chest tightness, and cough productive of sputum. This is increased from prior.   He quit smoking around the time of his  diagnosis with lung cancer. At that time, he underwent navigational bronchoscopy with biopsies. He underwent radiation therapy to the LUL mass. He has a history of Afib for which he is followed by cardiology, he is on Eliquis. He feels that some of his symptoms are due to his afib.   He did smoke for a long time, with at least 50 to 60 pack years on him. 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 He is currently retired.  PFT's performed 04/22/2022 showed FEV1/FVC at 59% and FEV1 at 69% predicted, post bronchodilator FEV1 was 84% predicted.   Ancillary information including prior medications, full medical/surgical/family/social histories, and PFTs (when available) are listed below and have been reviewed.   Review of Systems  Constitutional:  Negative for chills and fever.  HENT:  Positive for hearing loss. Negative for ear pain and tinnitus.   Respiratory:  Positive for cough, sputum production and shortness of breath. Negative for hemoptysis and wheezing.   Cardiovascular:  Negative for chest pain.     Objective:   Vitals:   02/13/23 1108  BP: 132/78  Pulse: 80  Temp:  97.6 F (36.4 C)  TempSrc: Temporal  SpO2: 90%  Weight: 271 lb 12.8 oz (123.3 kg)  Height: 6' (1.829 m)   90% on RA  BMI Readings from Last 3 Encounters:  02/13/23 36.86 kg/m  02/07/23 36.89 kg/m  01/10/23 36.32 kg/m   Wt Readings from Last 3 Encounters:  02/13/23 271 lb 12.8 oz (123.3 kg)  02/07/23 272 lb (123.4 kg)  01/10/23 267 lb 12.8 oz (121.5 kg)    Physical Exam Constitutional:      Appearance: He is obese.  HENT:     Head: Normocephalic.     Nose: Nose normal.     Mouth/Throat:     Mouth: Mucous membranes are moist.  Eyes:     Extraocular Movements: Extraocular movements intact.  Cardiovascular:     Rate and Rhythm: Normal rate.  Rhythm irregular.     Heart sounds: Normal heart sounds.  Pulmonary:     Effort: Pulmonary effort is normal.     Breath sounds: No wheezing.     Comments: Distant sounds, decreased entry over the left base Abdominal:     General: There is distension.     Palpations: Abdomen is soft.  Neurological:     General: No focal deficit present.     Mental Status: He is alert and oriented to person, place, and time. Mental status is at baseline.       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HCC)    Prostate enlargement    Shortness of breath    Squamous cell carcinoma of lung, left (HCC)    Thyroid disease    Tobacco abuse      Family History  Problem Relation Age of Onset   Aneurysm Mother    Cancer Father      Past Surgical History:  Procedure Laterality Date   CARDIOVERSION N/A    CARDIOVERSION N/A 11/24/2021   Procedure: CARDIOVERSION;  Surgeon: Yvonne Kendall, MD;  Location: [facility] ORS;  Service: Cardiovascular;  Laterality: N/A;   ELECTROMAGNETIC NAVIGATION BROCHOSCOPY Left 10/19/2018   Procedure: ELECTROMAGNETIC NAVIGATION BRONCHOSCOPY LEFT;  Surgeon: Salena Saner, MD;  Location: [facility] ORS;  Service: Cardiopulmonary;  Laterality: Left;   ENDOBRONCHIAL ULTRASOUND Left 10/19/2018   Procedure: ENDOBRONCHIAL ULTRASOUND LEFT;  Surgeon: Salena Saner, MD;  Location: [facility] ORS;  Service: Cardiopulmonary;  Laterality: Left;   MELANOMA EXCISION Left    NO PAST SURGERIES      Social History   Socioeconomic History   Marital status: Married    Spouse name: Olegario Messier   Number of children: 5   Years of education: Not on file   Highest education level: Not on file  Occupational History   Not on file  Tobacco Use   Smoking status: Former    Current packs/day: 0.00    Average  packs/day: 0.3 packs/day for 62.0 years (15.5 ttl pk-yrs)    Types: Cigarettes    Start date: 09/04/1956    Quit date: 09/05/2018    Years since quitting: 4.4   Smokeless tobacco: Never  Vaping Use   Vaping status: Never Used  Substance and Sexual Activity   Alcohol use: No    Alcohol/week: 0.0 standard drinks of alcohol   Drug use: No  Sexual activity: Not on file  Other Topics Concern   Not on file  Social History Narrative   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 He is married to his wife of 30+ years, Olegario Messier and has 5 children (1 deceased), many grand children, and one great grand child.    Social Determinants of Health   Financial Resource Strain: Low Risk  (12/04/2018)   Overall Financial Resource Strain (CARDIA)    Difficulty of Paying Living Expenses: Not very hard  Food Insecurity: No Food Insecurity (12/04/2018)   Hunger Vital Sign    Worried About Running Out of Food in the Last Year: Never true    Ran Out of Food in the Last Year: Never true  Transportation Needs: No Transportation Needs (12/04/2018)   PRAPARE - Administrator, Civil Service (Medical): No    Lack of Transportation (Non-Medical): No  Physical Activity: Not on file  Stress: Stress Concern Present (12/04/2018)   [employer] of Occupational Health - Occupational Stress Questionnaire    Feeling of Stress : Very much  Social Connections: Unknown (12/04/2018)   Social Connection and Isolation Panel [NHANES]    Frequency of Communication with Friends and Family: More than three times a week    Frequency of Social Gatherings with Friends and Family: Once a week    Attends Religious Services: Not on file    Active Member of Clubs or Organizations: Not on file    Attends Banker Meetings: Not on file    Marital Status: Not on  file  Intimate Partner Violence: Not on file     No Known Allergies   CBC    Component Value Date/Time   WBC 8.2 01/05/2022 1204   RBC 5.62 01/05/2022 1204   HGB 16.7 01/05/2022 1204   HCT 51.7 01/05/2022 1204   PLT 197 01/05/2022 1204   MCV 92.0 01/05/2022 1204   MCV 90.0 10/20/2015 1020   MCH 29.7 01/05/2022 1204   MCHC 32.3 01/05/2022 1204   RDW 14.3 01/05/2022 1204   LYMPHSABS 1.5 01/05/2022 1204   MONOABS 0.8 01/05/2022 1204   EOSABS 0.4 01/05/2022 1204   BASOSABS 0.1 01/05/2022 1204    Pulmonary Functions Testing Results:    Latest Ref Rng & Units 04/22/2022   12:02 PM  PFT Results  FVC-Pre L 3.75   FVC-Predicted Pre % 85   FVC-Post L 4.15   FVC-Predicted Post % 93   Pre FEV1/FVC % % 59   Post FEV1/FCV % % 64   FEV1-Pre L 2.20   FEV1-Predicted Pre % 69   FEV1-Post L 2.66   DLCO uncorrected ml/min/mmHg 11.02   DLCO UNC% % 42   DLVA Predicted % 47   TLC L 6.33   TLC % Predicted % 84   RV % Predicted % 78     Outpatient Medications Prior to Visit  Medication Sig Dispense Refill   albuterol (PROVENTIL HFA;VENTOLIN HFA) 108 (90 Base) MCG/ACT inhaler Inhale 2 puffs into the lungs every 6 (six) hours as needed for wheezing or shortness of breath. 1 Inhaler 0   albuterol (PROVENTIL) (2.5 MG/3ML) 0.083% nebulizer solution Take 2.5 mg by nebulization every 6 (six) hours as needed for wheezing or shortness of breath.     apixaban (ELIQUIS) 5 MG TABS tablet Take 2.5 mg  by mouth 2 (two) times daily.     carboxymethylcellulose (REFRESH PLUS) 0.5 % SOLN 1 drop 4 (four) times daily as needed.     finasteride (PROSCAR) 5 MG tablet Take 5 mg by mouth daily.     fluticasone-salmeterol (ADVAIR) 250-50 MCG/ACT AEPB Inhale 1 puff into the lungs in the morning and at bedtime.     levothyroxine (SYNTHROID) 200 MCG tablet Take 200 mcg by mouth daily before breakfast.     SEMAGLUTIDE,0.25 OR 0.5MG /DOS, [place] Inject 0.5 mg into the skin once a week.     tamsulosin (FLOMAX) 0.4 MG  CAPS capsule 0.8 mg daily.     Tiotropium Bromide Monohydrate (SPIRIVA RESPIMAT) 2.5 MCG/ACT AERS Inhale 2 puffs into the lungs daily. 1 each 11   torsemide (DEMADEX) 20 MG tablet TAKE 1 TABLET BY MOUTH EVERY OTHER DAY 45 tablet 2   vitamin B-12 (CYANOCOBALAMIN) 1000 MCG tablet Take 1,000 mcg by mouth daily.     No facility-administered medications prior to visit.

## 2023-02-15 ENCOUNTER — Ambulatory Visit: Payer: Medicare Other

## 2023-02-15 ENCOUNTER — Telehealth: Payer: Self-pay | Admitting: Internal Medicine

## 2023-02-15 NOTE — Telephone Encounter (Signed)
Pre-operative Risk Assessment    Patient Name: Levi [name]  DOB: 09-13-1940 MRN: [id number]{     Request for Surgical Clearance    Procedure:   US BIOPSY/THORA  Date of Surgery:  Clearance TBD                               Surgeon:  DR Olive Bass Surgeon's Group or Practice Name:  [facility] Phone number:  [phone](JANET BATES) Fax number:  [phone]  Type of Clearance Requested:   - Pharmacy:  Hold Apixaban (Eliquis) HOLD 2 DAYS WITH PROCEDURE ON THE 3RD DAY  Type of Anesthesia:  Not Indicated   Additional requests/questions:    Signed, Dalia Heading   02/15/2023, 12:17 PM

## 2023-02-15 NOTE — Telephone Encounter (Signed)
Patient Name: Levi [name]  DOB: [birth year] MRN: [id number]  Primary Cardiologist: Debbe Odea, MD  Chart reviewed as part of pre-operative protocol coverage. Pre-op clearance already addressed by colleagues in earlier phone notes. To summarize recommendations:  -Per office protocol, patient can hold Eliquis for 2 days prior to procedure as requested.  Please restart medically safe to do so.  No medical clearance was requested.  Will route this bundled recommendation to requesting provider via Epic fax function and remove from pre-op pool. Please call with questions.  Sharlene Dory, PA-C 02/15/2023, 3:41 PM

## 2023-02-15 NOTE — Telephone Encounter (Signed)
Patient with diagnosis of afib on Eliquis for anticoagulation.    Procedure: US biopsy Date of procedure: TBD  CHA2DS2-VASc Score = 5  This indicates a 7.2% annual risk of stroke. The patient's score is based upon: CHF History: 0 HTN History: 1 Diabetes History: 1 Stroke History: 0 Vascular Disease History: 1 Age Score: 2 Gender Score: 0  CrCl 83mL/min using adjusted body weight Platelet count 197K  Per office protocol, patient can hold Eliquis for 2 days prior to procedure as requested.    **This guidance is not considered finalized until pre-operative APP has relayed final recommendations.**

## 2023-02-16 ENCOUNTER — Other Ambulatory Visit: Payer: Self-pay | Admitting: Radiology

## 2023-02-16 ENCOUNTER — Encounter (HOSPITAL_COMMUNITY): Payer: Self-pay | Admitting: Radiology

## 2023-02-16 DIAGNOSIS — Z01812 Encounter for preprocedural laboratory examination: Secondary | ICD-10-CM

## 2023-02-16 NOTE — Progress Notes (Signed)
Patient for CT guided Lung Biopsy & Thoracentesis on Friday 02/17/2023, I called and spoke with the patient's wife, Olegario Messier on the phone and gave pre-procedure instructions. Olegario Messier was made aware to have the patient here at 10a, last dose of Eliquis was Tuesday 02/13/2023, NPO after MN prior to procedure as well as driver post procedure/recovery/discharge. Olegario Messier stated understanding.  Called 02/13/2023 and 02/16/2023  Cardiac Med Clearance has been scanned

## 2023-02-16 NOTE — Consult Note (Signed)
Chief Complaint: Extensive Hypermetabolic left hemithorax , multiple hypermetabolic pleural- based nodules, and a  moderate-sized left pleural effusion on PET scan. Request is for pleural lung nodule biopsy and thoracentesis. For further evaluation of  reoccurrence of SCC of LUL  Referring Physician(s): Dgayli,Khabib  Supervising Physician: Richarda Overlie  Patient Status: [facility] - Out-pt  History of Present Illness: Levi [name] is a 82 y.o. male outpatient. Former smoker. History of a fib (on eliquis) pulmonary emphysema, HTN, melanoma s/p Mohs in 2019, renal insufficiency, squamous cell carcinoma of the left lung.  Found to have left hilar mass and left sided pleural effusion while being worked up for cough. PET scan from 10.15.24 reads Extensive hypermetabolic metastatic disease throughout the left hemithorax with multiple hypermetabolic pleural- based nodules, a moderate-sized left pleural effusion, a large left hilar mass and subtotal left lung collapse. Multiple hypermetabolic mediastinal nodal metastases. Hypermetabolic cavitary right upper lobe pulmonary nodule, also suspicious for metastatic disease versus metachronous primary lung cancer. Team is requesting thoracentesis and pleural nodule biopsy for further evaluation appears to be recurrence of LUL cancer.   Currently without any significant complaints. Patient alert and laying in bed,calm. Denies any fevers, headache, chest pain, SOB, cough, abdominal pain, nausea, vomiting or bleeding. Return precautions and treatment recommendations and follow-up discussed with the patient  who is agreeable with the plan.     Past Medical History:  Diagnosis Date   Asthma    COPD (chronic obstructive pulmonary disease) (HCC)    Hearing loss    Hypertension    Hypothyroidism    Mass of left lung    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HCC)    Prostate enlargement    Shortness of breath    Squamous cell carcinoma of lung, left (HCC)    Thyroid  disease    Tobacco abuse     Past Surgical History:  Procedure Laterality Date   CARDIOVERSION N/A    CARDIOVERSION N/A 11/24/2021   Procedure: CARDIOVERSION;  Surgeon: Yvonne Kendall, MD;  Location: [facility] ORS;  Service: Cardiovascular;  Laterality: N/A;   ELECTROMAGNETIC NAVIGATION BROCHOSCOPY Left 10/19/2018   Procedure: ELECTROMAGNETIC NAVIGATION BRONCHOSCOPY LEFT;  Surgeon: Salena Saner, MD;  Location: [facility] ORS;  Service: Cardiopulmonary;  Laterality: Left;   ENDOBRONCHIAL ULTRASOUND Left 10/19/2018   Procedure: ENDOBRONCHIAL ULTRASOUND LEFT;  Surgeon: Salena Saner, MD;  Location: [facility] ORS;  Service: Cardiopulmonary;  Laterality: Left;   MELANOMA EXCISION Left    NO PAST SURGERIES      Allergies: Patient has no known allergies.  Medications: Prior to Admission medications   Medication Sig Start Date End Date Taking? Authorizing Provider  albuterol (PROVENTIL HFA;VENTOLIN HFA) 108 (90 Base) MCG/ACT inhaler Inhale 2 puffs into the lungs every 6 (six) hours as needed for wheezing or shortness of breath. 10/16/15   Sherren Mocha, MD  albuterol (PROVENTIL) (2.5 MG/3ML) 0.083% nebulizer solution Take 2.5 mg by nebulization every 6 (six) hours as needed for wheezing or shortness of breath.    [provider]  apixaban (ELIQUIS) 5 MG TABS tablet Take 2.5 mg by mouth 2 (two) times daily.    [provider]  carboxymethylcellulose (REFRESH PLUS) 0.5 % SOLN 1 drop 4 (four) times daily as needed.    [provider]  finasteride (PROSCAR) 5 MG tablet Take 5 mg by mouth daily.    [provider]  fluticasone-salmeterol (ADVAIR) 250-50 MCG/ACT AEPB Inhale 1 puff into the lungs in the morning and at bedtime.  [provider]  levothyroxine (SYNTHROID) 200 MCG tablet Take 200 mcg by mouth daily before breakfast.    [provider]  SEMAGLUTIDE,0.25 OR 0.5MG /DOS, [place] Inject 0.5 mg into the skin once a week.    [provider]   tamsulosin (FLOMAX) 0.4 MG CAPS capsule 0.8 mg daily. 04/05/21   [provider]  Tiotropium Bromide Monohydrate (SPIRIVA RESPIMAT) 2.5 MCG/ACT AERS Inhale 2 puffs into the lungs daily. 02/01/22   [name], MD  torsemide (DEMADEX) 20 MG tablet TAKE 1 TABLET BY MOUTH EVERY OTHER DAY 12/01/22   Duke Salvia, MD  vitamin B-12 (CYANOCOBALAMIN) 1000 MCG tablet Take 1,000 mcg by mouth daily.    [provider]     Family History  Problem Relation Age of Onset   Aneurysm Mother    Cancer Father     Social History   Socioeconomic History   Marital status: Married    Spouse name: Olegario Messier   Number of children: 5   Years of education: Not on file   Highest education level: Not on file  Occupational History   Not on file  Tobacco Use   Smoking status: Former    Current packs/day: 0.00    Average packs/day: 0.3 packs/day for 62.0 years (15.5 ttl pk-yrs)    Types: Cigarettes    Start date: 09/04/1956    Quit date: 09/05/2018    Years since quitting: 4.4   Smokeless tobacco: Never  Vaping Use   Vaping status: Never Used  Substance and Sexual Activity   Alcohol use: No    Alcohol/week: 0.0 standard drinks of alcohol   Drug use: No   Sexual activity: Not on file  Other Topics Concern   Not on file  Social History Narrative   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 He is married to his wife of 30+ years, Olegario Messier and has 5 children (1 deceased), many grand children, and one great grand child.    Social Determinants of Health   Financial Resource Strain: Low Risk  (12/04/2018)   Overall Financial Resource Strain (CARDIA)    Difficulty of Paying Living Expenses: Not very hard  Food Insecurity: No Food Insecurity (12/04/2018)   Hunger Vital Sign    Worried About Running Out of Food in the Last Year: Never true     Ran Out of Food in the Last Year: Never true  Transportation Needs: No Transportation Needs (12/04/2018)   PRAPARE - Administrator, Civil Service (Medical): No    Lack of Transportation (Non-Medical): No  Physical Activity: Not on file  Stress: Stress Concern Present (12/04/2018)   [employer] of Occupational Health - Occupational Stress Questionnaire    Feeling of Stress : Very much  Social Connections: Unknown (12/04/2018)   Social Connection and Isolation Panel [NHANES]    Frequency of Communication with Friends and Family: More than three times a week    Frequency of Social Gatherings with Friends and Family: Once a week    Attends Religious Services: Not on Marketing executive or Organizations: Not on file    Attends Banker Meetings: Not on file    Marital Status: Not on file    Review of Systems: A 12 point ROS discussed and pertinent positives are indicated in the  HPI above.  All other systems are negative.  Review of Systems  Constitutional:  Negative for fever.  HENT:  Negative for congestion.   Respiratory:  Negative for cough and shortness of breath.   Cardiovascular:  Negative for chest pain.  Gastrointestinal:  Negative for abdominal pain.  Neurological:  Negative for headaches.  Psychiatric/Behavioral:  Negative for behavioral problems and confusion.     Vital Signs: BP (!) 161/92   Pulse 69   Temp 98.9 F (37.2 C) (Oral)   Resp (!) 22   Ht 6' (1.829 m)   Wt 266 lb (120.7 kg)   SpO2 (!) 88% Comment: patient states has supplemental oxygen at home and when added today, SpO2 increased to 92  BMI 36.08 kg/m    Physical Exam Vitals and nursing note reviewed.  Constitutional:      Appearance: He is well-developed. He is obese.  HENT:     Head: Normocephalic.  Cardiovascular:     Rate and Rhythm: Normal rate.  Pulmonary:     Effort: Pulmonary effort is normal.  Musculoskeletal:        General: Normal range of  motion.     Cervical back: Normal range of motion.  Skin:    General: Skin is dry.  Neurological:     Mental Status: He is alert and oriented to person, place, and time. Mental status is at baseline.  Psychiatric:        Mood and Affect: Mood normal.        Behavior: Behavior normal.        Thought Content: Thought content normal.        Judgment: Judgment normal.     Imaging: NM PET Image Restag (PS) Skull Base To Thigh  Result Date: 02/14/2023 CLINICAL DATA:  Subsequent treatment strategy for squamous cell carcinoma of the left lung with progressive metastatic disease on recent CT. EXAM: NUCLEAR MEDICINE PET SKULL BASE TO THIGH TECHNIQUE: 13.19 mCi F-18 FDG was injected intravenously. Full-ring PET imaging was performed from the skull base to thigh after the radiotracer. CT data was obtained and used for attenuation correction and anatomic localization. Fasting blood glucose: 122 mg/dl COMPARISON:  Chest CT 16/01/9603 and 01/31/2022.  PET-CT 04/15/2019. FINDINGS: Mediastinal blood pool activity: SUV max 3.0 NECK: No hypermetabolic cervical lymph nodes are identified. No suspicious activity identified within the pharyngeal mucosal space. Incidental CT findings: Bilateral carotid atherosclerosis. CHEST: As seen on recent CT, there are multiple enlarged mediastinal and left hilar lymph nodes which are hypermetabolic on PET-CT. The largest lymph node is in the left infrahilar region measuring 3.8 x 3.3 cm on image 64/6 (SUV max 10.8). The large left hilar mass is intensely hypermetabolic. This measures approximately 7.8 x 5.9 cm on image 53/6 and has an SUV max of 10.3. 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 Persistent moderate-sized left pleural effusion with subtotal left lung collapse. The cavitary right upper lobe nodule seen on recent CT is also hypermetabolic. This nodule  measures 1.6 x 1.5 cm on image 40/6 and has an SUV max of 12.5. No other suspicious nodularity or hypermetabolic activity identified in the right hemithorax. Incidental CT findings: Centrilobular emphysema. Atherosclerosis of the aorta, great vessels and coronary arteries. ABDOMEN/PELVIS: There is no hypermetabolic activity within the liver, adrenal glands, spleen or pancreas. There is no hypermetabolic nodal activity in the abdomen or pelvis. Bowel activity is within physiologic limits. Pleural based  tumor in the left hemithorax may involve the left hemithorax, especially posteriorly. Incidental CT findings: Stable right renal cyst. Mild aortic and branch vessel atherosclerosis. There are diverticular changes throughout the sigmoid colon without evidence of acute inflammation. SKELETON: There is no hypermetabolic activity to suggest osseous metastatic disease. Incidental CT findings: Multilevel spondylosis. IMPRESSION: 1. 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 Multiple hypermetabolic mediastinal nodal metastases. 2. Hypermetabolic cavitary right upper lobe pulmonary nodule, also suspicious for metastatic disease versus metachronous primary lung cancer. 3. No evidence of metastatic disease in the abdomen or pelvis. 4. Coronary artery atherosclerosis. Aortic Atherosclerosis (ICD10-I70.0) and Emphysema (ICD10-J43.9). Electronically Signed   By: Carey Bullocks M.D.   On: 02/14/2023 13:30   MR Brain W Wo Contrast  Result Date: 02/14/2023 CLINICAL DATA:  82 year old male with lung cancer. Progression on recent Chest CT. Staging. EXAM: MRI HEAD WITHOUT AND WITH CONTRAST TECHNIQUE: Multiplanar, multiecho pulse sequences of the brain and surrounding structures were obtained without and with intravenous contrast. CONTRAST:  10mL  GADAVIST GADOBUTROL 1 MMOL/ML IV SOLN COMPARISON:  Chest CT 02/03/2023.  PET-CT 04/15/2019. FINDINGS: Brain: Cerebral volume is within normal limits for age. No midline shift, mass effect, or evidence of intracranial mass lesion. No abnormal enhancement identified. No dural thickening identified. No restricted diffusion to suggest acute infarction. No ventriculomegaly, extra-axial collection or acute intracranial hemorrhage. Cervicomedullary junction and pituitary are within normal limits. Mild for age patchy bilateral white matter T2 and FLAIR hyperintensity. No cortical encephalomalacia identified. No chronic cerebral blood products on SWI. Deep gray nuclei and brainstem appear normal for age. There is a tiny chronic lacunar infarct in the posterior right cerebellum on series 8, image 8. Vascular: Major intracranial vascular flow voids are preserved. Following contrast the major dural venous sinuses are enhancing and appear to be patent. Skull and upper cervical spine: Negative visible cervical spine, spinal cord. Visualized bone marrow signal is within normal limits. Sinuses/Orbits: Negative. Other: Mastoids are well aerated. Visible internal auditory structures appear normal. Negative visible scalp and face. IMPRESSION: 1.  No metastatic disease or acute intracranial abnormality. 2. 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 Electronically Signed   By: Odessa Fleming M.D.   On: 02/14/2023 12:18   CT Chest W Contrast  Result Date: 02/03/2023 CLINICAL DATA:  Left lung cancer restaging * Tracking Code: BO * EXAM: CT CHEST WITH CONTRAST TECHNIQUE: Multidetector CT imaging of the chest was performed during intravenous contrast administration. RADIATION DOSE REDUCTION: This exam was performed according to the departmental dose-optimization program which includes automated exposure control, adjustment of the mA and/or kV according to patient size and/or use of iterative reconstruction technique. CONTRAST:  75mL  OMNIPAQUE IOHEXOL 300 MG/ML  SOLN COMPARISON:  Chest radiograph, 01/10/2023, CT chest, 01/31/2022 FINDINGS: Cardiovascular: Aortic atherosclerosis. Mild cardiomegaly. Left and right coronary artery calcifications no pericardial effusion. Mediastinum/Nodes: Numerous newly enlarged mediastinal and left hilar lymph nodes, largest measuring 2.3 x 1.7 cm (series 2, image 59). Thyroid gland, trachea, and esophagus demonstrate no significant findings. Lungs/Pleura: Severe emphysema and diffuse bilateral bronchial wall thickening. New, large bulky left hilar mass measuring 7.3 x 6.0 cm (series 2, image 61). New, moderate left pleural effusion with extensive pleural nodularity throughout the left hemithorax, largest index nodule about the medial left lower hemithorax measures 3.8 x 3.3 cm (series 2, image 113). New, cavitary nodule of the right  pulmonary apex measuring 1.6 x 1.5 cm (series 4, image 31). Upper Abdomen: No acute abnormality.  Hepatic steatosis. Musculoskeletal: No chest wall abnormality. No acute osseous findings. Disc degenerative disease and bridging osteophytosis throughout the thoracic spine. IMPRESSION: 1. New, large bulky left hilar mass. 2. New, moderate left pleural effusion with extensive pleural nodularity throughout the left hemithorax. 3. New, cavitary nodule of the right pulmonary apex. 4. Numerous newly enlarged mediastinal and left hilar lymph nodes. 5. Constellation of findings is consistent with recurrent left hilar malignancy with pleural, nodal, and contralateral pulmonary metastatic disease. 6. Severe emphysema and diffuse bilateral bronchial wall thickening. 7. Coronary artery disease. 8. Hepatic steatosis. These results will be called to the ordering clinician or representative by the Radiologist Assistant, and communication documented in the PACS or [employer]. Aortic Atherosclerosis (ICD10-I70.0) and Emphysema (ICD10-J43.9). Electronically Signed   By: Jearld Lesch M.D.   On:  02/03/2023 12:08    Labs:  CBC: Recent Labs    02/17/23 1051  WBC 7.3  HGB 16.6  HCT 51.4  PLT 221    COAGS: Recent Labs    02/17/23 1051  INR 1.2    BMP: Recent Labs    02/03/23 1027  CREATININE 1.50*    LIVER FUNCTION TESTS: No results for input(s): "BILITOT", "AST", "ALT", "ALKPHOS", "PROT", "ALBUMIN" in the last 8760 hours.   Assessment and Plan:  82 y.o male outpatient. Former smoker. History of a fib (on eliquis) pulmonary emphysema, HTN, melanoma s/p Mohs in 2019, renal insufficiency, squamous cell carcinoma of the left lung.  Found to have left hilar mass and left sided pleural effusion while being worked up for cough. PET scan from 10.15.24 reads Extensive hypermetabolic metastatic disease throughout the left hemithorax with multiple hypermetabolic pleural- based nodules, a moderate-sized left pleural effusion, a large left hilar mass and subtotal left lung collapse. Multiple hypermetabolic mediastinal nodal metastases. Hypermetabolic cavitary right upper lobe pulmonary nodule, also suspicious for metastatic disease versus metachronous primary lung cancer. Team is requesting thoracentesis and pleural nodule biopsy for further evaluation appears to be recurrence of LUL cancer.    Labs pending. Patient is on eliquis. Last dose given on 10.14.24. NKDA. Patient has been NPO since midnight.   Risks and benefits of pleural nodule biopsy and thoracentesis was discussed with the patient and/or patient's family including, but not limited to bleeding, infection, damage to adjacent structures or low yield requiring additional tests.  All of the questions were answered and there is agreement to proceed.  Consent signed and in chart.  Thank you for this interesting consult.  I greatly enjoyed meeting VOLTAIRE LAWRANCE and look forward to participating in their care.  A copy of this report was sent to the requesting provider on this date.  Electronically Signed: Alene Mires, NP 02/17/2023, 11:43 AM   I spent a total of  30 Minutes   in face to face in clinical consultation, greater than 50% of which was counseling/coordinating care for pleural nodule biopsy and thoracentesis

## 2023-02-17 ENCOUNTER — Other Ambulatory Visit: Payer: Self-pay | Admitting: Student in an Organized Health Care Education/Training Program

## 2023-02-17 ENCOUNTER — Other Ambulatory Visit: Payer: Self-pay

## 2023-02-17 ENCOUNTER — Ambulatory Visit
Admission: RE | Admit: 2023-02-17 | Discharge: 2023-02-17 | Disposition: A | Discharge status: Home / Self Care | Payer: Medicare Other | Source: Ambulatory Visit | Attending: Diagnostic Radiology | Admitting: Diagnostic Radiology

## 2023-02-17 ENCOUNTER — Ambulatory Visit: Payer: Medicare Other

## 2023-02-17 ENCOUNTER — Ambulatory Visit
Admission: RE | Admit: 2023-02-17 | Discharge: 2023-02-17 | Disposition: A | Discharge status: Home / Self Care | Payer: Medicare Other | Source: Ambulatory Visit | Attending: Student in an Organized Health Care Education/Training Program

## 2023-02-17 ENCOUNTER — Other Ambulatory Visit: Payer: Self-pay | Admitting: Diagnostic Radiology

## 2023-02-17 DIAGNOSIS — Z85820 Personal history of malignant melanoma of skin: Secondary | ICD-10-CM | POA: Insufficient documentation

## 2023-02-17 DIAGNOSIS — C3492 Malignant neoplasm of unspecified part of left bronchus or lung: Secondary | ICD-10-CM

## 2023-02-17 DIAGNOSIS — Z87891 Personal history of nicotine dependence: Secondary | ICD-10-CM | POA: Insufficient documentation

## 2023-02-17 DIAGNOSIS — R222 Localized swelling, mass and lump, trunk: Secondary | ICD-10-CM | POA: Diagnosis present

## 2023-02-17 DIAGNOSIS — C7802 Secondary malignant neoplasm of left lung: Secondary | ICD-10-CM | POA: Diagnosis not present

## 2023-02-17 DIAGNOSIS — Z7901 Long term (current) use of anticoagulants: Secondary | ICD-10-CM | POA: Diagnosis not present

## 2023-02-17 DIAGNOSIS — I10 Essential (primary) hypertension: Secondary | ICD-10-CM | POA: Insufficient documentation

## 2023-02-17 DIAGNOSIS — J90 Pleural effusion, not elsewhere classified: Secondary | ICD-10-CM | POA: Diagnosis not present

## 2023-02-17 DIAGNOSIS — J438 Other emphysema: Secondary | ICD-10-CM | POA: Insufficient documentation

## 2023-02-17 DIAGNOSIS — Z85118 Personal history of other malignant neoplasm of bronchus and lung: Secondary | ICD-10-CM | POA: Diagnosis present

## 2023-02-17 DIAGNOSIS — Z923 Personal history of irradiation: Secondary | ICD-10-CM | POA: Insufficient documentation

## 2023-02-17 DIAGNOSIS — Z9889 Other specified postprocedural states: Secondary | ICD-10-CM

## 2023-02-17 DIAGNOSIS — Z01812 Encounter for preprocedural laboratory examination: Secondary | ICD-10-CM

## 2023-02-17 DIAGNOSIS — Z9221 Personal history of antineoplastic chemotherapy: Secondary | ICD-10-CM | POA: Insufficient documentation

## 2023-02-17 LAB — PROTIME-INR
INR: 1.2 (ref 0.8–1.2)
Prothrombin Time: 15.3 s — ABNORMAL HIGH (ref 11.4–15.2)

## 2023-02-17 LAB — CBC
HCT: 51.4 % (ref 39.0–52.0)
Hemoglobin: 16.6 g/dL (ref 13.0–17.0)
MCH: 30.3 pg (ref 26.0–34.0)
MCHC: 32.3 g/dL (ref 30.0–36.0)
MCV: 94 fL (ref 80.0–100.0)
Platelets: 221 10*3/uL (ref 150–400)
RBC: 5.47 MIL/uL (ref 4.22–5.81)
RDW: 13.4 % (ref 11.5–15.5)
WBC: 7.3 10*3/uL (ref 4.0–10.5)
nRBC: 0 % (ref 0.0–0.2)

## 2023-02-17 LAB — BODY FLUID CELL COUNT WITH DIFFERENTIAL
Eos, Fluid: 1 %
Lymphs, Fluid: 74 %
Monocyte-Macrophage-Serous Fluid: 12 %
Neutrophil Count, Fluid: 11 %
Other Cells, Fluid: 2 %
Total Nucleated Cell Count, Fluid: 2944 uL

## 2023-02-17 LAB — PROTEIN, PLEURAL OR PERITONEAL FLUID: Total protein, fluid: 3.7 g/dL

## 2023-02-17 LAB — GLUCOSE, PLEURAL OR PERITONEAL FLUID: Glucose, Fluid: 113 mg/dL

## 2023-02-17 MED ORDER — MIDAZOLAM HCL 2 MG/2ML IJ SOLN
INTRAMUSCULAR | Status: DC | PRN
Start: 2023-02-17 — End: 2023-02-18
  Administered 2023-02-17: .5 mg via INTRAVENOUS

## 2023-02-17 MED ORDER — FENTANYL CITRATE (PF) 100 MCG/2ML IJ SOLN
INTRAMUSCULAR | Status: AC
  Filled 2023-02-17: qty 2

## 2023-02-17 MED ORDER — LIDOCAINE HCL (PF) 1 % IJ SOLN
20.0000 mL | Freq: Once | INTRAMUSCULAR | Status: AC
  Administered 2023-02-17: 20 mL via INTRADERMAL
  Filled 2023-02-17: qty 20

## 2023-02-17 MED ORDER — LIDOCAINE HCL (PF) 1 % IJ SOLN
10.0000 mL | Freq: Once | INTRAMUSCULAR | Status: AC
  Administered 2023-02-17: 10 mL via INTRADERMAL
  Filled 2023-02-17: qty 10

## 2023-02-17 MED ORDER — MIDAZOLAM HCL 2 MG/2ML IJ SOLN
INTRAMUSCULAR | Status: AC
  Filled 2023-02-17: qty 2

## 2023-02-17 MED ORDER — FENTANYL CITRATE (PF) 100 MCG/2ML IJ SOLN
INTRAMUSCULAR | Status: DC | PRN
Start: 2023-02-17 — End: 2023-02-18
  Administered 2023-02-17: 25 ug via INTRAVENOUS

## 2023-02-17 NOTE — Procedures (Signed)
Interventional Radiology Procedure:   Indications: History of lung cancer and evidence for new disease or disease progression  Procedure: CT guided biopsy of left pleural nodule and CT guided left thoracentesis  Findings: Core biopsies obtained from a left pleural nodule.  1 liter of dark red pleural fluid removed.   Complications: None     EBL: Minimal  Plan: CXR and anticipate discharge in 2 hours.   Clevland Cork R. Lowella Dandy, MD  Pager: [phone]

## 2023-02-19 LAB — TRIGLYCERIDES, BODY FLUIDS: Triglycerides, Fluid: 22 mg/dL

## 2023-02-21 LAB — SURGICAL PATHOLOGY

## 2023-02-21 LAB — CHOLESTEROL, BODY FLUID: Cholesterol, Fluid: 60 mg/dL

## 2023-02-21 LAB — CYTOLOGY - NON PAP

## 2023-02-22 LAB — BODY FLUID CULTURE W GRAM STAIN: Gram Stain: NONE SEEN

## 2023-02-24 ENCOUNTER — Other Ambulatory Visit: Payer: Self-pay | Admitting: *Deleted

## 2023-02-24 ENCOUNTER — Encounter: Payer: Self-pay | Admitting: Oncology

## 2023-02-24 ENCOUNTER — Encounter: Payer: Self-pay | Admitting: *Deleted

## 2023-02-24 ENCOUNTER — Inpatient Hospital Stay (HOSPITAL_BASED_OUTPATIENT_CLINIC_OR_DEPARTMENT_OTHER): Payer: Medicare Other | Admitting: Oncology

## 2023-02-24 VITALS — BP 135/86 | HR 84 | Temp 97.1°F | Resp 18 | Ht 72.0 in | Wt 269.0 lb

## 2023-02-24 DIAGNOSIS — C3492 Malignant neoplasm of unspecified part of left bronchus or lung: Secondary | ICD-10-CM | POA: Insufficient documentation

## 2023-02-24 DIAGNOSIS — C3412 Malignant neoplasm of upper lobe, left bronchus or lung: Secondary | ICD-10-CM | POA: Diagnosis not present

## 2023-02-24 MED ORDER — LIDOCAINE-PRILOCAINE 2.5-2.5 % EX CREA: TOPICAL_CREAM | CUTANEOUS | 3 refills | Status: DC

## 2023-02-24 MED ORDER — ONDANSETRON HCL 8 MG PO TABS: 8.0000 mg | ORAL_TABLET | Freq: Three times a day (TID) | ORAL | 2 refills | Status: DC | PRN

## 2023-02-24 MED ORDER — PROCHLORPERAZINE MALEATE 10 MG PO TABS: 10.0000 mg | ORAL_TABLET | Freq: Four times a day (QID) | ORAL | 2 refills | Status: DC | PRN

## 2023-02-24 MED ORDER — OXYCODONE HCL 10 MG PO TABS
10.0000 mg | ORAL_TABLET | Freq: Four times a day (QID) | ORAL | 0 refills | Status: DC | PRN
Start: 2023-02-24 — End: 2023-08-28

## 2023-02-24 NOTE — Progress Notes (Signed)
Pharmacist Chemotherapy Monitoring - Initial Assessment    Anticipated start date: 03/06/27   The following has been reviewed per standard work regarding the patient's treatment regimen: The patient's diagnosis, treatment plan and drug doses, and organ/hematologic function Lab orders and baseline tests specific to treatment regimen  The treatment plan start date, drug sequencing, and pre-medications Prior authorization status  Patient's documented medication list, including drug-drug interaction screen and prescriptions for anti-emetics and supportive care specific to the treatment regimen The drug concentrations, fluid compatibility, administration routes, and timing of the medications to be used The patient's access for treatment and lifetime cumulative dose history, if applicable  The patient's medication allergies and previous infusion related reactions, if applicable   Changes made to treatment plan:  Switched to neulasta  Follow up needed:  N/A   Sharen Hones, PharmD, BCPS Clinical Pharmacist   02/24/2023  3:31 PM

## 2023-02-24 NOTE — Patient Instructions (Signed)
Form faxed to IR for port placement.

## 2023-02-24 NOTE — Telephone Encounter (Signed)
Oxycodone did not get sent to pharmacy as it was sent as a "Print"

## 2023-02-24 NOTE — Progress Notes (Signed)
ALERT: A disease instance has been permanently removed from this patient's pathway record and replaced with a new disease instance. Information on the new disease instance will be transmitted in a separate message.  Disease Being Removed: Non-Small Cell Lung  Reason for Removal: Reason not listed 

## 2023-02-24 NOTE — Progress Notes (Signed)
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  INTERVAL HISTORY: Patient returns to clinic today for further evaluation, discussion of his biopsy results, and treatment planning.  He continues to have pain from his biopsy, but otherwise feels well.  He has chronic shortness of breath and requires intermittent oxygen.  He remains anxious.  He has no neurologic complaints.  He denies any recent fevers or illnesses.  He denies any chest pain, cough, or hemoptysis.  He has a good appetite and denies weight loss.  He denies any nausea, vomiting, constipation, or diarrhea.  He has no urinary complaints.  Patient offers no further specific complaints today.  REVIEW OF SYSTEMS:   Review of Systems  Constitutional:  Positive for malaise/fatigue. Negative for fever and weight loss.  HENT: Negative.  Negative for congestion.   Respiratory:  Positive for cough and shortness of breath. Negative for hemoptysis.   Cardiovascular: Negative.  Negative for chest pain and leg swelling.  Gastrointestinal: Negative.  Negative for abdominal pain and nausea.  Genitourinary: Negative.  Negative for dysuria.  Musculoskeletal: Negative.  Negative for back pain.  Skin:  Negative for rash.  Neurological: Negative.  Negative for dizziness, focal weakness, weakness and headaches.  Endo/Heme/Allergies:  Does not bruise/bleed  easily.  Psychiatric/Behavioral:  Negative for depression. The patient is nervous/anxious.     As per HPI. Otherwise, a complete review of systems is negative.  PAST MEDICAL HISTORY: Past Medical History:  Diagnosis Date   Asthma    COPD (chronic obstructive pulmonary disease) (HCC)    Hearing loss    Hypertension    Hypothyroidism    Mass of left lung    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HCC)    Prostate enlargement    Shortness of breath    Squamous cell carcinoma of lung, left (HCC)    Thyroid disease    Tobacco abuse     PAST SURGICAL HISTORY: Past Surgical History:  Procedure Laterality Date   CARDIOVERSION N/A    CARDIOVERSION N/A 11/24/2021   Procedure: CARDIOVERSION;  Surgeon: Yvonne Kendall, MD;  Location: [facility] ORS;  Service: Cardiovascular;  Laterality: N/A;   ELECTROMAGNETIC NAVIGATION BROCHOSCOPY Left 10/19/2018   Procedure: ELECTROMAGNETIC NAVIGATION BRONCHOSCOPY LEFT;  Surgeon: Salena Saner, MD;  Location: [facility] ORS;  Service: Cardiopulmonary;  Laterality: Left;   ENDOBRONCHIAL ULTRASOUND Left 10/19/2018   Procedure: ENDOBRONCHIAL ULTRASOUND LEFT;  Surgeon: Salena Saner, MD;  Location: [facility] ORS;  Service: Cardiopulmonary;  Laterality: Left;   MELANOMA EXCISION Left    NO PAST SURGERIES      FAMILY HISTORY: Family History  Problem Relation Age of Onset   Aneurysm Mother    Cancer Father     ADVANCED DIRECTIVES (Y/N):  N  HEALTH MAINTENANCE: Social History   Tobacco Use   Smoking status: Former    Current packs/day: 0.00    Average packs/day: 0.3 packs/day for 62.0 years (15.5 ttl pk-yrs)    Types: Cigarettes    Start date: 09/04/1956  Quit date: 09/05/2018    Years since quitting: 4.4   Smokeless tobacco: Never  Vaping Use   Vaping status: Never Used  Substance Use Topics   Alcohol use: No    Alcohol/week: 0.0 standard drinks of alcohol   Drug use: No     Colonoscopy:  PAP:  Bone density:  Lipid panel:  No Known  Allergies  Current Outpatient Medications  Medication Sig Dispense Refill   albuterol (PROVENTIL HFA;VENTOLIN HFA) 108 (90 Base) MCG/ACT inhaler Inhale 2 puffs into the lungs every 6 (six) hours as needed for wheezing or shortness of breath. 1 Inhaler 0   albuterol (PROVENTIL) (2.5 MG/3ML) 0.083% nebulizer solution Take 2.5 mg by nebulization every 6 (six) hours as needed for wheezing or shortness of breath.     apixaban (ELIQUIS) 5 MG TABS tablet Take 2.5 mg by mouth 2 (two) times daily.     carboxymethylcellulose (REFRESH PLUS) 0.5 % SOLN 1 drop 4 (four) times daily as needed.     finasteride (PROSCAR) 5 MG tablet Take 5 mg by mouth daily.     fluticasone-salmeterol (ADVAIR) 250-50 MCG/ACT AEPB Inhale 1 puff into the lungs in the morning and at bedtime.     levothyroxine (SYNTHROID) 200 MCG tablet Take 200 mcg by mouth daily before breakfast.     Oxycodone HCl 10 MG TABS Take 1 tablet (10 mg total) by mouth every 6 (six) hours as needed. Okay to cut tablet in half if needed. 60 tablet 0   SEMAGLUTIDE,0.25 OR 0.5MG /DOS, [place] Inject 0.5 mg into the skin once a week.     tamsulosin (FLOMAX) 0.4 MG CAPS capsule 0.8 mg daily.     Tiotropium Bromide Monohydrate (SPIRIVA RESPIMAT) 2.5 MCG/ACT AERS Inhale 2 puffs into the lungs daily. 1 each 11   torsemide (DEMADEX) 20 MG tablet TAKE 1 TABLET BY MOUTH EVERY OTHER DAY 45 tablet 2   vitamin B-12 (CYANOCOBALAMIN) 1000 MCG tablet Take 1,000 mcg by mouth daily.     glimepiride (AMARYL) 2 MG tablet Take 2 mg by mouth daily with breakfast. (Patient not taking: Reported on 02/24/2023)     lidocaine-prilocaine (EMLA) cream Apply to affected area once 30 g 3   ondansetron (ZOFRAN) 8 MG tablet Take 1 tablet (8 mg total) by mouth every 8 (eight) hours as needed for nausea, vomiting or refractory nausea / vomiting. Start on the third day after carboplatin. 60 tablet 2   prochlorperazine (COMPAZINE) 10 MG tablet Take 1 tablet (10 mg total) by mouth every 6 (six) hours  as needed for nausea or vomiting. 60 tablet 2   No current facility-administered medications for this visit.    OBJECTIVE: Vitals:   02/24/23 1005  BP: 135/86  Pulse: 84  Resp: 18  Temp: (!) 97.1 F (36.2 C)  SpO2: 92%      Body mass index is 36.48 kg/m.    ECOG FS:1 - Symptomatic but completely ambulatory  General: Well-developed, well-nourished, no acute distress. Eyes: Pink conjunctiva, anicteric sclera. HEENT: Normocephalic, moist mucous membranes. Lungs: No audible wheezing or coughing. Heart: Regular rate and rhythm. Abdomen: Soft, nontender, no obvious distention. Musculoskeletal: No edema, cyanosis, or clubbing. Neuro: Alert, answering all questions appropriately. Cranial nerves grossly intact. Skin: No rashes or petechiae noted. Psych: Normal affect.  LAB RESULTS:  Lab Results  Component Value Date   NA 138 11/03/2021   K 4.0 11/03/2021   CL 110 11/03/2021   CO2 19 (L) 11/03/2021   GLUCOSE 84 11/03/2021   BUN  24 (H) 11/03/2021   CREATININE 1.50 (H) 02/03/2023   CALCIUM 9.0 11/03/2021   PROT 6.4 (L) 07/18/2019   ALBUMIN 3.7 07/18/2019   AST 29 07/18/2019   ALT 35 07/18/2019   ALKPHOS 51 07/18/2019   BILITOT 0.8 07/18/2019   GFRNONAA 43 (L) 11/03/2021   GFRAA 58 (L) 07/18/2019    Lab Results  Component Value Date   WBC 7.3 02/17/2023   NEUTROABS 5.4 01/05/2022   HGB 16.6 02/17/2023   HCT 51.4 02/17/2023   MCV 94.0 02/17/2023   PLT 221 02/17/2023     STUDIES: DG Chest Port 1 View  Result Date: 02/17/2023 CLINICAL DATA:  Status post left pleural nodule biopsy and status post left thoracentesis. EXAM: PORTABLE CHEST 1 VIEW COMPARISON:  01/10/2023. FINDINGS: Negative for pneumothorax following the left thoracentesis and left pleural nodule biopsy. Again noted is a left perihilar mass. Opacities at the left lung base could represent residual pleural fluid and known pleural disease. Trachea is midline. Heart size is upper limits of normal but stable.  IMPRESSION: 1. Negative for pneumothorax following left thoracentesis and left pleural nodule biopsy. 2. Left perihilar mass. 3. Left basilar opacities could represent residual pleural fluid. Electronically Signed   By: Richarda Overlie M.D.   On: 02/17/2023 14:25   CT LUNG MASS BIOPSY  Result Date: 02/17/2023 INDICATION: 82 year old with history of lung cancer. Evidence for disease progression or recurrence. Patient needs a tissue diagnosis. EXAM: 1. CT-guided biopsy of left pleural nodule 2. CT-guided left thoracentesis TECHNIQUE: Multidetector CT imaging of the chest was performed following the standard protocol without IV contrast. RADIATION DOSE REDUCTION: This exam was performed according to the departmental dose-optimization program which includes automated exposure control, adjustment of the mA and/or kV according to patient size and/or use of iterative reconstruction technique. MEDICATIONS: Moderate sedation ANESTHESIA/SEDATION: Moderate (conscious) sedation was employed during this procedure. A total of Versed 0.5 mg and Fentanyl 25 mcg was administered intravenously by the radiology nurse. Total intra-service moderate Sedation Time: 14 minutes. The patient's level of consciousness and vital signs were monitored continuously by radiology nursing throughout the procedure under my direct supervision. COMPLICATIONS: None immediate. PROCEDURE: Informed written consent was obtained from the patient after a thorough discussion of the procedural risks, benefits and alternatives. All questions were addressed. A timeout was performed prior to the initiation of the procedure. Patient was placed on his right side. CT images through the chest were obtained. Pleural nodularity in the posterior left lower chest was targeted for biopsy. In addition, the left side of the chest was evaluated with ultrasound and demonstrated that the pleural fluid was relatively simple. The left side of the back was prepped with  chlorhexidine and sterile field was created. Skin was anesthetized using 1% lidocaine. Small incision was made. Using CT guidance, 17 gauge coaxial needle was directed into a posterior left pleural nodule. Multiple core biopsies were obtained with an 18 gauge device. Specimens placed in formalin. Attention was directed to performing at thoracentesis. Using CT guidance, a Yueh catheter was directed into the pleural fluid along the posterior lower aspect of the left chest. Dark red fluid was aspirated. Approximately 1 L of fluid was removed. Follow up CT images were obtained. Yueh catheter was removed. Bandages were placed at both puncture sites. FINDINGS: CT images demonstrate a large left perihilar mass and extensive left pleural nodularity. There is also a nodule in the right upper lobe. Biopsy needle confirmed within a posterior left pleural nodule. 1 L of  dark red fluid was removed from the left pleural space. IMPRESSION: CT-guided biopsy of a left pleural nodule and CT-guided left thoracentesis. Electronically Signed   By: Richarda Overlie M.D.   On: 02/17/2023 14:23   CT GUIDED NEEDLE PLACEMENT  Result Date: 02/17/2023 INDICATION: 82 year old with history of lung cancer. Evidence for disease progression or recurrence. Patient needs a tissue diagnosis. EXAM: 1. CT-guided biopsy of left pleural nodule 2. CT-guided left thoracentesis TECHNIQUE: Multidetector CT imaging of the chest was performed following the standard protocol without IV contrast. RADIATION DOSE REDUCTION: This exam was performed according to the departmental dose-optimization program which includes automated exposure control, adjustment of the mA and/or kV according to patient size and/or use of iterative reconstruction technique. MEDICATIONS: Moderate sedation ANESTHESIA/SEDATION: Moderate (conscious) sedation was employed during this procedure. A total of Versed 0.5 mg and Fentanyl 25 mcg was administered intravenously by the radiology nurse.  Total intra-service moderate Sedation Time: 14 minutes. The patient's level of consciousness and vital signs were monitored continuously by radiology nursing throughout the procedure under my direct supervision. COMPLICATIONS: None immediate. PROCEDURE: Informed written consent was obtained from the patient after a thorough discussion of the procedural risks, benefits and alternatives. All questions were addressed. A timeout was performed prior to the initiation of the procedure. Patient was placed on his right side. CT images through the chest were obtained. Pleural nodularity in the posterior left lower chest was targeted for biopsy. In addition, the left side of the chest was evaluated with ultrasound and demonstrated that the pleural fluid was relatively simple. The left side of the back was prepped with chlorhexidine and sterile field was created. Skin was anesthetized using 1% lidocaine. Small incision was made. Using CT guidance, 17 gauge coaxial needle was directed into a posterior left pleural nodule. Multiple core biopsies were obtained with an 18 gauge device. Specimens placed in formalin. Attention was directed to performing at thoracentesis. Using CT guidance, a Yueh catheter was directed into the pleural fluid along the posterior lower aspect of the left chest. Dark red fluid was aspirated. Approximately 1 L of fluid was removed. Follow up CT images were obtained. Yueh catheter was removed. Bandages were placed at both puncture sites. FINDINGS: CT images demonstrate a large left perihilar mass and extensive left pleural nodularity. There is also a nodule in the right upper lobe. Biopsy needle confirmed within a posterior left pleural nodule. 1 L of dark red fluid was removed from the left pleural space. IMPRESSION: CT-guided biopsy of a left pleural nodule and CT-guided left thoracentesis. Electronically Signed   By: Richarda Overlie M.D.   On: 02/17/2023 14:23   NM PET Image Restag (PS) Skull Base To  Thigh  Result Date: 02/14/2023 CLINICAL DATA:  Subsequent treatment strategy for squamous cell carcinoma of the left lung with progressive metastatic disease on recent CT. EXAM: NUCLEAR MEDICINE PET SKULL BASE TO THIGH TECHNIQUE: 13.19 mCi F-18 FDG was injected intravenously. Full-ring PET imaging was performed from the skull base to thigh after the radiotracer. CT data was obtained and used for attenuation correction and anatomic localization. Fasting blood glucose: 122 mg/dl COMPARISON:  Chest CT 40/98/1191 and 01/31/2022.  PET-CT 04/15/2019. FINDINGS: Mediastinal blood pool activity: SUV max 3.0 NECK: No hypermetabolic cervical lymph nodes are identified. No suspicious activity identified within the pharyngeal mucosal space. Incidental CT findings: Bilateral carotid atherosclerosis. CHEST: As seen on recent CT, there are multiple enlarged mediastinal and left hilar lymph nodes which are hypermetabolic on PET-CT. The largest  lymph node is in the left infrahilar region measuring 3.8 x 3.3 cm on image 64/6 (SUV max 10.8). The large left hilar mass is intensely hypermetabolic. This measures approximately 7.8 x 5.9 cm on image 53/6 and has an SUV max of 10.3. 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 Persistent moderate-sized left pleural effusion with subtotal left lung collapse. The cavitary right upper lobe nodule seen on recent CT is also hypermetabolic. This nodule measures 1.6 x 1.5 cm on image 40/6 and has an SUV max of 12.5. No other suspicious nodularity or hypermetabolic activity identified in the right hemithorax. Incidental CT findings: Centrilobular emphysema. Atherosclerosis of the aorta, great vessels and coronary arteries. ABDOMEN/PELVIS: There is no hypermetabolic activity within the liver, adrenal glands, spleen or pancreas. There is no hypermetabolic nodal activity in the abdomen  or pelvis. Bowel activity is within physiologic limits. Pleural based tumor in the left hemithorax may involve the left hemithorax, especially posteriorly. Incidental CT findings: Stable right renal cyst. Mild aortic and branch vessel atherosclerosis. There are diverticular changes throughout the sigmoid colon without evidence of acute inflammation. SKELETON: There is no hypermetabolic activity to suggest osseous metastatic disease. Incidental CT findings: Multilevel spondylosis. IMPRESSION: 1. 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 Multiple hypermetabolic mediastinal nodal metastases. 2. Hypermetabolic cavitary right upper lobe pulmonary nodule, also suspicious for metastatic disease versus metachronous primary lung cancer. 3. No evidence of metastatic disease in the abdomen or pelvis. 4. Coronary artery atherosclerosis. Aortic Atherosclerosis (ICD10-I70.0) and Emphysema (ICD10-J43.9). Electronically Signed   By: Carey Bullocks M.D.   On: 02/14/2023 13:30   MR Brain W Wo Contrast  Result Date: 02/14/2023 CLINICAL DATA:  82 year old male with lung cancer. Progression on recent Chest CT. Staging. EXAM: MRI HEAD WITHOUT AND WITH CONTRAST TECHNIQUE: Multiplanar, multiecho pulse sequences of the brain and surrounding structures were obtained without and with intravenous contrast. CONTRAST:  10mL GADAVIST GADOBUTROL 1 MMOL/ML IV SOLN COMPARISON:  Chest CT 02/03/2023.  PET-CT 04/15/2019. FINDINGS: Brain: Cerebral volume is within normal limits for age. No midline shift, mass effect, or evidence of intracranial mass lesion. No abnormal enhancement identified. No dural thickening identified. No restricted diffusion to suggest acute infarction. No ventriculomegaly, extra-axial collection or acute intracranial hemorrhage.  Cervicomedullary junction and pituitary are within normal limits. Mild for age patchy bilateral white matter T2 and FLAIR hyperintensity. No cortical encephalomalacia identified. No chronic cerebral blood products on SWI. Deep gray nuclei and brainstem appear normal for age. There is a tiny chronic lacunar infarct in the posterior right cerebellum on series 8, image 8. Vascular: Major intracranial vascular flow voids are preserved. Following contrast the major dural venous sinuses are enhancing and appear to be patent. Skull and upper cervical spine: Negative visible cervical spine, spinal cord. Visualized bone marrow signal is within normal limits. Sinuses/Orbits: Negative. Other: Mastoids are well aerated. Visible internal auditory structures appear normal. Negative visible scalp and face. IMPRESSION: 1.  No metastatic disease or acute intracranial abnormality. 2. 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 Electronically Signed   By: Odessa Fleming M.D.   On: 02/14/2023 12:18   CT Chest W Contrast  Result Date: 02/03/2023 CLINICAL DATA:  Left lung cancer restaging * Tracking Code: BO * EXAM: CT CHEST WITH CONTRAST TECHNIQUE: Multidetector CT imaging of the  chest was performed during intravenous contrast administration. RADIATION DOSE REDUCTION: This exam was performed according to the departmental dose-optimization program which includes automated exposure control, adjustment of the mA and/or kV according to patient size and/or use of iterative reconstruction technique. CONTRAST:  75mL OMNIPAQUE IOHEXOL 300 MG/ML  SOLN COMPARISON:  Chest radiograph, 01/10/2023, CT chest, 01/31/2022 FINDINGS: Cardiovascular: Aortic atherosclerosis. Mild cardiomegaly. Left and right coronary artery calcifications no pericardial effusion. Mediastinum/Nodes: Numerous newly enlarged mediastinal and left hilar lymph nodes, largest measuring 2.3 x 1.7 cm (series 2, image 59). Thyroid gland, trachea, and esophagus demonstrate no  significant findings. Lungs/Pleura: Severe emphysema and diffuse bilateral bronchial wall thickening. New, large bulky left hilar mass measuring 7.3 x 6.0 cm (series 2, image 61). New, moderate left pleural effusion with extensive pleural nodularity throughout the left hemithorax, largest index nodule about the medial left lower hemithorax measures 3.8 x 3.3 cm (series 2, image 113). New, cavitary nodule of the right pulmonary apex measuring 1.6 x 1.5 cm (series 4, image 31). Upper Abdomen: No acute abnormality.  Hepatic steatosis. Musculoskeletal: No chest wall abnormality. No acute osseous findings. Disc degenerative disease and bridging osteophytosis throughout the thoracic spine. IMPRESSION: 1. New, large bulky left hilar mass. 2. New, moderate left pleural effusion with extensive pleural nodularity throughout the left hemithorax. 3. New, cavitary nodule of the right pulmonary apex. 4. Numerous newly enlarged mediastinal and left hilar lymph nodes. 5. Constellation of findings is consistent with recurrent left hilar malignancy with pleural, nodal, and contralateral pulmonary metastatic disease. 6. Severe emphysema and diffuse bilateral bronchial wall thickening. 7. Coronary artery disease. 8. Hepatic steatosis. These results will be called to the ordering clinician or representative by the Radiologist Assistant, and communication documented in the PACS or [employer]. Aortic Atherosclerosis (ICD10-I70.0) and Emphysema (ICD10-J43.9). Electronically Signed   By: Jearld Lesch M.D.   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  PET scan results from February 14, 2023 reviewed independently and reported as above confirming stage of disease although patient does not have metastatic disease outside his chest.  MRI of the brain on February 14, 2023 was negative  for metastatic disease.  Patient wishes to pursue chemotherapy, therefore will receive carboplatinum, etoposide, and Tecentriq on day 1 with etoposide only on day 2 and 3.  Patient will require [place] support.  Plan to do 4 cycles every 3 weeks and then reimage with PET scan.  If improvement of disease burden, will transition to Tecentriq maintenance every 3 weeks.  Patient will require port placement prior to initiating treatment.  Return to clinic on March 06, 2023 to initiate cycle 1, day 1. Clinical stage IIB squamous cell carcinoma, left upper lobe lung:  Patient underwent biopsy with navigational bronchoscopy on October 19, 2018 confirming diagnosis.  Patient declined surgery and wished to pursue XRT along with concurrent chemotherapy.  Given his stage of disease he did not receive maintenance immunotherapy.  He completed cycle 7 of weekly carboplatinum and Taxol on January 09, 2019 and then completed XRT on January 14, 2019.   History of melanoma: Unclear stage or depth, although by report was in situ.  Patient had Mohs surgery in fall 2019.  Previous lung biopsy consistent with squamous cell carcinoma. Anxiety/depression: Continue current medications as prescribed.  Continue follow-up with primary care physician as well as  psychiatry at the [place]. Renal insufficiency: Chronic and unchanged.  Patient's most recent creatinine is 1.50.  Shortness of breath/cough: Likely secondary to hilar mass seen on CT scan.  Continue oxygen as needed. Pain: Patient was given a prescription for oxycodone today.  Patient expressed understanding and was in agreement with this plan. He also understands that He can call clinic at any time with any questions, concerns, or complaints.    Cancer Staging  Small cell lung cancer, left [facility]) Staging form: Lung, AJCC 8th Edition - Clinical stage from 02/24/2023: Stage IVA (cT4, cN2, cM1a) - Signed by Jeralyn Ruths, MD on 02/24/2023  Squamous cell carcinoma lung,  left (HCC) Staging form: Lung, AJCC 8th Edition - Clinical stage from 10/27/2018: Stage IIB (cT1c, cN1, cM0) - Signed by Jeralyn Ruths, MD on 11/16/2018   Jeralyn Ruths, MD   02/24/2023 11:43 AM

## 2023-02-24 NOTE — Progress Notes (Signed)
START ON PATHWAY REGIMEN - Small Cell Lung     Cycles 1 through 4, every 21 days:     Atezolizumab      Carboplatin      Etoposide    Cycles 5 and beyond, every 21 days:     Atezolizumab   **Always confirm dose/schedule in your pharmacy ordering system**  Patient Characteristics: Newly Diagnosed, Preoperative or Nonsurgical Candidate (Clinical Staging), First Line, Extensive Stage Therapeutic Status: Newly Diagnosed, Preoperative or Nonsurgical Candidate (Clinical Staging) AJCC T Category: cT4 AJCC N Category: cN2 AJCC M Category: cM1a AJCC 8 Stage Grouping: IVA Stage Classification: Extensive  Intent of Therapy: Non-Curative / Palliative Intent, Discussed with Patient

## 2023-02-24 NOTE — Progress Notes (Signed)
The following medication: neulasta has been selected for use in this patient.    Sharen Hones, PharmD, BCPS Clinical Pharmacist

## 2023-02-27 ENCOUNTER — Other Ambulatory Visit: Payer: Medicare Other

## 2023-02-27 ENCOUNTER — Inpatient Hospital Stay: Payer: Medicare Other

## 2023-02-27 ENCOUNTER — Other Ambulatory Visit: Payer: Self-pay | Admitting: Oncology

## 2023-02-27 DIAGNOSIS — C3492 Malignant neoplasm of unspecified part of left bronchus or lung: Secondary | ICD-10-CM

## 2023-02-27 DIAGNOSIS — C3412 Malignant neoplasm of upper lobe, left bronchus or lung: Secondary | ICD-10-CM | POA: Diagnosis not present

## 2023-02-27 LAB — CBC WITH DIFFERENTIAL/PLATELET
Abs Immature Granulocytes: 0.04 10*3/uL (ref 0.00–0.07)
Basophils Absolute: 0 10*3/uL (ref 0.0–0.1)
Basophils Relative: 1 %
Eosinophils Absolute: 0.1 10*3/uL (ref 0.0–0.5)
Eosinophils Relative: 1 %
HCT: 49.9 % (ref 39.0–52.0)
Hemoglobin: 15.9 g/dL (ref 13.0–17.0)
Immature Granulocytes: 1 %
Lymphocytes Relative: 16 %
Lymphs Abs: 1 10*3/uL (ref 0.7–4.0)
MCH: 30.5 pg (ref 26.0–34.0)
MCHC: 31.9 g/dL (ref 30.0–36.0)
MCV: 95.8 fL (ref 80.0–100.0)
Monocytes Absolute: 0.7 10*3/uL (ref 0.1–1.0)
Monocytes Relative: 12 %
Neutro Abs: 4.5 10*3/uL (ref 1.7–7.7)
Neutrophils Relative %: 69 %
Platelets: 212 10*3/uL (ref 150–400)
RBC: 5.21 MIL/uL (ref 4.22–5.81)
RDW: 13.3 % (ref 11.5–15.5)
WBC: 6.4 10*3/uL (ref 4.0–10.5)
nRBC: 0 % (ref 0.0–0.2)

## 2023-02-27 LAB — CMP (CANCER CENTER ONLY)
ALT: 12 U/L (ref 0–44)
AST: 22 U/L (ref 15–41)
Albumin: 3.3 g/dL — ABNORMAL LOW (ref 3.5–5.0)
Alkaline Phosphatase: 55 U/L (ref 38–126)
Anion gap: 7 (ref 5–15)
BUN: 24 mg/dL — ABNORMAL HIGH (ref 8–23)
CO2: 23 mmol/L (ref 22–32)
Calcium: 8.6 mg/dL — ABNORMAL LOW (ref 8.9–10.3)
Chloride: 106 mmol/L (ref 98–111)
Creatinine: 1.46 mg/dL — ABNORMAL HIGH (ref 0.61–1.24)
GFR, Estimated: 48 mL/min — ABNORMAL LOW (ref >60)
Glucose, Bld: 132 mg/dL — ABNORMAL HIGH (ref 70–99)
Potassium: 4.2 mmol/L (ref 3.5–5.1)
Sodium: 136 mmol/L (ref 135–145)
Total Bilirubin: 0.8 mg/dL (ref 0.3–1.2)
Total Protein: 6.2 g/dL — ABNORMAL LOW (ref 6.5–8.1)

## 2023-02-28 ENCOUNTER — Encounter: Payer: Self-pay | Admitting: Oncology

## 2023-02-28 ENCOUNTER — Other Ambulatory Visit (HOSPITAL_COMMUNITY): Payer: Self-pay | Admitting: Student

## 2023-02-28 NOTE — Progress Notes (Signed)
Patient for IR Port Placement on Wed 03/01/2023, I called and spoke with the patient's wife, Olegario Messier on the phone and gave pre-procedure instructions. Olegario Messier was made aware to be here at 1p, NPO after MN prior to procedure as well as driver post procedure/recovery/discharge. Olegario Messier stated understanding.  Called 02/28/2023

## 2023-02-28 NOTE — H&P (Incomplete)
Chief Complaint: Patient was seen in consultation today for port-a-catheter placement.   Referring Physician(s): Finnegan,Timothy J  Supervising Physician: Pernell Dupre  Patient Status: [facility] - Out-pt  History of Present Illness: Levi [name] is an 82 y.o. 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 He was treated with radiation and chemotherapy.   He was recently found to have a left hilar mass with left pleural effusion while being worked up for a persistent cough, fatigue and shortness of breath. He is familiar to IR from a thoracentesis and pleural node biopsy 02/17/23. 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 His oncology team is preparing him for chemotherapy for recurrent lung cancer.   Interventional Radiology has been asked to evaluate this patient for an image-guided port-a-catheter placement to facilitate his treatment plans.   Past Medical History:  Diagnosis Date   Asthma    COPD (chronic obstructive pulmonary disease) (HCC)    Hearing loss    Hypertension    Hypothyroidism    Mass of left lung    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HCC)    Prostate enlargement    Shortness of breath    Squamous cell carcinoma of lung, left (HCC)    Thyroid disease    Tobacco abuse     Past Surgical History:  Procedure Laterality Date   CARDIOVERSION N/A    CARDIOVERSION N/A 11/24/2021   Procedure: CARDIOVERSION;  Surgeon: Yvonne Kendall, MD;  Location: [facility] ORS;  Service: Cardiovascular;  Laterality: N/A;   ELECTROMAGNETIC NAVIGATION BROCHOSCOPY Left 10/19/2018   Procedure: ELECTROMAGNETIC NAVIGATION BRONCHOSCOPY LEFT;  Surgeon: Salena Saner, MD;  Location: [facility] ORS;  Service: Cardiopulmonary;  Laterality: Left;   ENDOBRONCHIAL ULTRASOUND Left 10/19/2018   Procedure: ENDOBRONCHIAL ULTRASOUND LEFT;   Surgeon: Salena Saner, MD;  Location: [facility] ORS;  Service: Cardiopulmonary;  Laterality: Left;   MELANOMA EXCISION Left    NO PAST SURGERIES      Allergies: Patient has no known allergies.  Medications: Prior to Admission medications   Medication Sig Start Date End Date Taking? Authorizing Provider  albuterol (PROVENTIL HFA;VENTOLIN HFA) 108 (90 Base) MCG/ACT inhaler Inhale 2 puffs into the lungs every 6 (six) hours as needed for wheezing or shortness of breath. 10/16/15   Sherren Mocha, MD  albuterol (PROVENTIL) (2.5 MG/3ML) 0.083% nebulizer solution Take 2.5 mg by nebulization every 6 (six) hours as needed for wheezing or shortness of breath.    [provider]  apixaban (ELIQUIS) 5 MG TABS tablet Take 2.5 mg by mouth 2 (two) times daily.    [provider]  carboxymethylcellulose (REFRESH PLUS) 0.5 % SOLN 1 drop 4 (four) times daily as needed.    [provider]  finasteride (PROSCAR) 5 MG tablet Take 5 mg by mouth daily.    [provider]  fluticasone-salmeterol (ADVAIR) 250-50 MCG/ACT AEPB Inhale 1 puff into the lungs in the morning and at bedtime.    [provider]  glimepiride (AMARYL) 2 MG tablet Take 2 mg by mouth daily with breakfast. Patient not taking: Reported on 02/24/2023 10/26/22   [provider]  levothyroxine (SYNTHROID) 200 MCG tablet Take 200 mcg by mouth daily before breakfast.    [provider]  lidocaine-prilocaine (EMLA) cream Apply to affected area once 02/24/23   Jeralyn Ruths, MD  ondansetron (ZOFRAN) 8 MG tablet Take 1 tablet (  8 mg total) by mouth every 8 (eight) hours as needed for nausea, vomiting or refractory nausea / vomiting. Start on the third day after carboplatin. 02/24/23   Jeralyn Ruths, MD  Oxycodone HCl 10 MG TABS Take 1 tablet (10 mg total) by mouth every 6 (six) hours as needed. Okay to cut tablet in half if needed. 02/24/23   Jeralyn Ruths, MD  prochlorperazine  (COMPAZINE) 10 MG tablet Take 1 tablet (10 mg total) by mouth every 6 (six) hours as needed for nausea or vomiting. 02/24/23   Jeralyn Ruths, MD  SEMAGLUTIDE,0.25 OR 0.5MG /DOS, [place] Inject 0.5 mg into the skin once a week.    [provider]  tamsulosin (FLOMAX) 0.4 MG CAPS capsule 0.8 mg daily. 04/05/21   [provider]  Tiotropium Bromide Monohydrate (SPIRIVA RESPIMAT) 2.5 MCG/ACT AERS Inhale 2 puffs into the lungs daily. 02/01/22   [name], MD  torsemide (DEMADEX) 20 MG tablet TAKE 1 TABLET BY MOUTH EVERY OTHER DAY 12/01/22   Duke Salvia, MD  vitamin B-12 (CYANOCOBALAMIN) 1000 MCG tablet Take 1,000 mcg by mouth daily.    [provider]     Family History  Problem Relation Age of Onset   Aneurysm Mother    Cancer Father     Social History   Socioeconomic History   Marital status: Married    Spouse name: Olegario Messier   Number of children: 5   Years of education: Not on file   Highest education level: Not on file  Occupational History   Not on file  Tobacco Use   Smoking status: Former    Current packs/day: 0.00    Average packs/day: 0.3 packs/day for 62.0 years (15.5 ttl pk-yrs)    Types: Cigarettes    Start date: 09/04/1956    Quit date: 09/05/2018    Years since quitting: 4.4   Smokeless tobacco: Never  Vaping Use   Vaping status: Never Used  Substance and Sexual Activity   Alcohol use: No    Alcohol/week: 0.0 standard drinks of alcohol   Drug use: No   Sexual activity: Not on file  Other Topics Concern   Not on file  Social History Narrative   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 He is married to his wife of 30+ years, Olegario Messier and has 5 children (1 deceased), many grand children, and one great grand child.    Social Determinants of Health   Financial Resource Strain: Low  Risk  (12/04/2018)   Overall Financial Resource Strain (CARDIA)    Difficulty of Paying Living Expenses: Not very hard  Food Insecurity: No Food Insecurity (12/04/2018)   Hunger Vital Sign    Worried About Running Out of Food in the Last Year: Never true    Ran Out of Food in the Last Year: Never true  Transportation Needs: No Transportation Needs (12/04/2018)   PRAPARE - Administrator, Civil Service (Medical): No    Lack of Transportation (Non-Medical): No  Physical Activity: Not on file  Stress: Stress Concern Present (12/04/2018)   [employer] of Occupational Health - Occupational Stress Questionnaire    Feeling of Stress : Very much  Social Connections: Unknown (12/04/2018)   Social Connection and Isolation Panel [NHANES]    Frequency of Communication with Friends and Family: More than  three times a week    Frequency of Social Gatherings with Friends and Family: Once a week    Attends Religious Services: Not on Marketing executive or Organizations: Not on file    Attends Banker Meetings: Not on file    Marital Status: Not on file    Review of Systems: A 12 point ROS discussed and pertinent positives are indicated in the HPI above.  All other systems are negative.  Review of Systems  Vital Signs: There were no vitals taken for this visit.  Physical Exam  Imaging: DG Chest Port 1 View  Result Date: 02/17/2023 CLINICAL DATA:  Status post left pleural nodule biopsy and status post left thoracentesis. EXAM: PORTABLE CHEST 1 VIEW COMPARISON:  01/10/2023. FINDINGS: Negative for pneumothorax following the left thoracentesis and left pleural nodule biopsy. Again noted is a left perihilar mass. Opacities at the left lung base could represent residual pleural fluid and known pleural disease. Trachea is midline. Heart size is upper limits of normal but stable. IMPRESSION: 1. Negative for pneumothorax following left thoracentesis and left pleural nodule  biopsy. 2. Left perihilar mass. 3. Left basilar opacities could represent residual pleural fluid. Electronically Signed   By: Richarda Overlie M.D.   On: 02/17/2023 14:25   CT LUNG MASS BIOPSY  Result Date: 02/17/2023 INDICATION: 82 year old with history of lung cancer. Evidence for disease progression or recurrence. Patient needs a tissue diagnosis. EXAM: 1. CT-guided biopsy of left pleural nodule 2. CT-guided left thoracentesis TECHNIQUE: Multidetector CT imaging of the chest was performed following the standard protocol without IV contrast. RADIATION DOSE REDUCTION: This exam was performed according to the departmental dose-optimization program which includes automated exposure control, adjustment of the mA and/or kV according to patient size and/or use of iterative reconstruction technique. MEDICATIONS: Moderate sedation ANESTHESIA/SEDATION: Moderate (conscious) sedation was employed during this procedure. A total of Versed 0.5 mg and Fentanyl 25 mcg was administered intravenously by the radiology nurse. Total intra-service moderate Sedation Time: 14 minutes. The patient's level of consciousness and vital signs were monitored continuously by radiology nursing throughout the procedure under my direct supervision. COMPLICATIONS: None immediate. PROCEDURE: Informed written consent was obtained from the patient after a thorough discussion of the procedural risks, benefits and alternatives. All questions were addressed. A timeout was performed prior to the initiation of the procedure. Patient was placed on his right side. CT images through the chest were obtained. Pleural nodularity in the posterior left lower chest was targeted for biopsy. In addition, the left side of the chest was evaluated with ultrasound and demonstrated that the pleural fluid was relatively simple. The left side of the back was prepped with chlorhexidine and sterile field was created. Skin was anesthetized using 1% lidocaine. Small incision was  made. Using CT guidance, 17 gauge coaxial needle was directed into a posterior left pleural nodule. Multiple core biopsies were obtained with an 18 gauge device. Specimens placed in formalin. Attention was directed to performing at thoracentesis. Using CT guidance, a Yueh catheter was directed into the pleural fluid along the posterior lower aspect of the left chest. Dark red fluid was aspirated. Approximately 1 L of fluid was removed. Follow up CT images were obtained. Yueh catheter was removed. Bandages were placed at both puncture sites. FINDINGS: CT images demonstrate a large left perihilar mass and extensive left pleural nodularity. There is also a nodule in the right upper lobe. Biopsy needle confirmed within a posterior left pleural nodule.  1 L of dark red fluid was removed from the left pleural space. IMPRESSION: CT-guided biopsy of a left pleural nodule and CT-guided left thoracentesis. Electronically Signed   By: Richarda Overlie M.D.   On: 02/17/2023 14:23   CT GUIDED NEEDLE PLACEMENT  Result Date: 02/17/2023 INDICATION: 82 year old with history of lung cancer. Evidence for disease progression or recurrence. Patient needs a tissue diagnosis. EXAM: 1. CT-guided biopsy of left pleural nodule 2. CT-guided left thoracentesis TECHNIQUE: Multidetector CT imaging of the chest was performed following the standard protocol without IV contrast. RADIATION DOSE REDUCTION: This exam was performed according to the departmental dose-optimization program which includes automated exposure control, adjustment of the mA and/or kV according to patient size and/or use of iterative reconstruction technique. MEDICATIONS: Moderate sedation ANESTHESIA/SEDATION: Moderate (conscious) sedation was employed during this procedure. A total of Versed 0.5 mg and Fentanyl 25 mcg was administered intravenously by the radiology nurse. Total intra-service moderate Sedation Time: 14 minutes. The patient's level of consciousness and vital signs  were monitored continuously by radiology nursing throughout the procedure under my direct supervision. COMPLICATIONS: None immediate. PROCEDURE: Informed written consent was obtained from the patient after a thorough discussion of the procedural risks, benefits and alternatives. All questions were addressed. A timeout was performed prior to the initiation of the procedure. Patient was placed on his right side. CT images through the chest were obtained. Pleural nodularity in the posterior left lower chest was targeted for biopsy. In addition, the left side of the chest was evaluated with ultrasound and demonstrated that the pleural fluid was relatively simple. The left side of the back was prepped with chlorhexidine and sterile field was created. Skin was anesthetized using 1% lidocaine. Small incision was made. Using CT guidance, 17 gauge coaxial needle was directed into a posterior left pleural nodule. Multiple core biopsies were obtained with an 18 gauge device. Specimens placed in formalin. Attention was directed to performing at thoracentesis. Using CT guidance, a Yueh catheter was directed into the pleural fluid along the posterior lower aspect of the left chest. Dark red fluid was aspirated. Approximately 1 L of fluid was removed. Follow up CT images were obtained. Yueh catheter was removed. Bandages were placed at both puncture sites. FINDINGS: CT images demonstrate a large left perihilar mass and extensive left pleural nodularity. There is also a nodule in the right upper lobe. Biopsy needle confirmed within a posterior left pleural nodule. 1 L of dark red fluid was removed from the left pleural space. IMPRESSION: CT-guided biopsy of a left pleural nodule and CT-guided left thoracentesis. Electronically Signed   By: Richarda Overlie M.D.   On: 02/17/2023 14:23   NM PET Image Restag (PS) Skull Base To Thigh  Result Date: 02/14/2023 CLINICAL DATA:  Subsequent treatment strategy for squamous cell carcinoma of  the left lung with progressive metastatic disease on recent CT. EXAM: NUCLEAR MEDICINE PET SKULL BASE TO THIGH TECHNIQUE: 13.19 mCi F-18 FDG was injected intravenously. Full-ring PET imaging was performed from the skull base to thigh after the radiotracer. CT data was obtained and used for attenuation correction and anatomic localization. Fasting blood glucose: 122 mg/dl COMPARISON:  Chest CT 16/01/9603 and 01/31/2022.  PET-CT 04/15/2019. FINDINGS: Mediastinal blood pool activity: SUV max 3.0 NECK: No hypermetabolic cervical lymph nodes are identified. No suspicious activity identified within the pharyngeal mucosal space. Incidental CT findings: Bilateral carotid atherosclerosis. CHEST: As seen on recent CT, there are multiple enlarged mediastinal and left hilar lymph nodes which are hypermetabolic on  PET-CT. The largest lymph node is in the left infrahilar region measuring 3.8 x 3.3 cm on image 64/6 (SUV max 10.8). The large left hilar mass is intensely hypermetabolic. This measures approximately 7.8 x 5.9 cm on image 53/6 and has an SUV max of 10.3. 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 Persistent moderate-sized left pleural effusion with subtotal left lung collapse. The cavitary right upper lobe nodule seen on recent CT is also hypermetabolic. This nodule measures 1.6 x 1.5 cm on image 40/6 and has an SUV max of 12.5. No other suspicious nodularity or hypermetabolic activity identified in the right hemithorax. Incidental CT findings: Centrilobular emphysema. Atherosclerosis of the aorta, great vessels and coronary arteries. ABDOMEN/PELVIS: There is no hypermetabolic activity within the liver, adrenal glands, spleen or pancreas. There is no hypermetabolic nodal activity in the abdomen or pelvis. Bowel activity is within physiologic limits. Pleural based tumor in the left hemithorax may involve  the left hemithorax, especially posteriorly. Incidental CT findings: Stable right renal cyst. Mild aortic and branch vessel atherosclerosis. There are diverticular changes throughout the sigmoid colon without evidence of acute inflammation. SKELETON: There is no hypermetabolic activity to suggest osseous metastatic disease. Incidental CT findings: Multilevel spondylosis. IMPRESSION: 1. 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 Multiple hypermetabolic mediastinal nodal metastases. 2. Hypermetabolic cavitary right upper lobe pulmonary nodule, also suspicious for metastatic disease versus metachronous primary lung cancer. 3. No evidence of metastatic disease in the abdomen or pelvis. 4. Coronary artery atherosclerosis. Aortic Atherosclerosis (ICD10-I70.0) and Emphysema (ICD10-J43.9). Electronically Signed   By: Carey Bullocks M.D.   On: 02/14/2023 13:30   MR Brain W Wo Contrast  Result Date: 02/14/2023 CLINICAL DATA:  82 year old male with lung cancer. Progression on recent Chest CT. Staging. EXAM: MRI HEAD WITHOUT AND WITH CONTRAST TECHNIQUE: Multiplanar, multiecho pulse sequences of the brain and surrounding structures were obtained without and with intravenous contrast. CONTRAST:  10mL GADAVIST GADOBUTROL 1 MMOL/ML IV SOLN COMPARISON:  Chest CT 02/03/2023.  PET-CT 04/15/2019. FINDINGS: Brain: Cerebral volume is within normal limits for age. No midline shift, mass effect, or evidence of intracranial mass lesion. No abnormal enhancement identified. No dural thickening identified. No restricted diffusion to suggest acute infarction. No ventriculomegaly, extra-axial collection or acute intracranial hemorrhage. Cervicomedullary junction and pituitary are within normal limits. Mild for age patchy bilateral white matter T2 and FLAIR  hyperintensity. No cortical encephalomalacia identified. No chronic cerebral blood products on SWI. Deep gray nuclei and brainstem appear normal for age. There is a tiny chronic lacunar infarct in the posterior right cerebellum on series 8, image 8. Vascular: Major intracranial vascular flow voids are preserved. Following contrast the major dural venous sinuses are enhancing and appear to be patent. Skull and upper cervical spine: Negative visible cervical spine, spinal cord. Visualized bone marrow signal is within normal limits. Sinuses/Orbits: Negative. Other: Mastoids are well aerated. Visible internal auditory structures appear normal. Negative visible scalp and face. IMPRESSION: 1.  No metastatic disease or acute intracranial abnormality. 2. 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 Electronically Signed   By: Odessa Fleming M.D.   On: 02/14/2023 12:18   CT Chest W Contrast  Result Date: 02/03/2023 CLINICAL DATA:  Left lung cancer restaging * Tracking Code: BO * EXAM: CT CHEST WITH CONTRAST TECHNIQUE: Multidetector CT  imaging of the chest was performed during intravenous contrast administration. RADIATION DOSE REDUCTION: This exam was performed according to the departmental dose-optimization program which includes automated exposure control, adjustment of the mA and/or kV according to patient size and/or use of iterative reconstruction technique. CONTRAST:  75mL OMNIPAQUE IOHEXOL 300 MG/ML  SOLN COMPARISON:  Chest radiograph, 01/10/2023, CT chest, 01/31/2022 FINDINGS: Cardiovascular: Aortic atherosclerosis. Mild cardiomegaly. Left and right coronary artery calcifications no pericardial effusion. Mediastinum/Nodes: Numerous newly enlarged mediastinal and left hilar lymph nodes, largest measuring 2.3 x 1.7 cm (series 2, image 59). Thyroid gland, trachea, and esophagus demonstrate no significant findings. Lungs/Pleura: Severe emphysema and diffuse bilateral bronchial wall thickening. New, large bulky left  hilar mass measuring 7.3 x 6.0 cm (series 2, image 61). New, moderate left pleural effusion with extensive pleural nodularity throughout the left hemithorax, largest index nodule about the medial left lower hemithorax measures 3.8 x 3.3 cm (series 2, image 113). New, cavitary nodule of the right pulmonary apex measuring 1.6 x 1.5 cm (series 4, image 31). Upper Abdomen: No acute abnormality.  Hepatic steatosis. Musculoskeletal: No chest wall abnormality. No acute osseous findings. Disc degenerative disease and bridging osteophytosis throughout the thoracic spine. IMPRESSION: 1. New, large bulky left hilar mass. 2. New, moderate left pleural effusion with extensive pleural nodularity throughout the left hemithorax. 3. New, cavitary nodule of the right pulmonary apex. 4. Numerous newly enlarged mediastinal and left hilar lymph nodes. 5. Constellation of findings is consistent with recurrent left hilar malignancy with pleural, nodal, and contralateral pulmonary metastatic disease. 6. Severe emphysema and diffuse bilateral bronchial wall thickening. 7. Coronary artery disease. 8. Hepatic steatosis. These results will be called to the ordering clinician or representative by the Radiologist Assistant, and communication documented in the PACS or [employer]. Aortic Atherosclerosis (ICD10-I70.0) and Emphysema (ICD10-J43.9). Electronically Signed   By: Jearld Lesch M.D.   On: 02/03/2023 12:08    Labs:  CBC: Recent Labs    02/17/23 1051 02/27/23 0838  WBC 7.3 6.4  HGB 16.6 15.9  HCT 51.4 49.9  PLT 221 212    COAGS: Recent Labs    02/17/23 1051  INR 1.2    BMP: Recent Labs    02/03/23 1027 02/27/23 0838  NA  --  136  K  --  4.2  CL  --  106  CO2  --  23  GLUCOSE  --  132*  BUN  --  24*  CALCIUM  --  8.6*  CREATININE 1.50* 1.46*  GFRNONAA  --  48*    LIVER FUNCTION TESTS: Recent Labs    02/27/23 0838  BILITOT 0.8  AST 22  ALT 12  ALKPHOS 55  PROT 6.2*  ALBUMIN 3.3*    TUMOR  MARKERS: No results for input(s): "AFPTM", "CEA", "CA199", "CHROMGRNA" in the last 8760 hours.  Assessment and Plan:  Recurrent left lung cancer; pending chemotherapy: Virgel H. Diab, 82 year old male, presents today to the [facility] Interventional Radiology department for an image-guided port-a-catheter placement.  Risks and benefits of image-guided port-a-catheter placement were discussed with the patient including, but not limited to bleeding, infection, pneumothorax, or fibrin sheath development and need for additional procedures.  All of the patient's questions were answered, patient is agreeable to proceed. He has been NPO. He is a full code.   Consent signed and in chart.  Thank you for this interesting consult.  I greatly enjoyed meeting DARYLL DOTO and look forward to participating in their care.  A copy of this report was sent to the requesting provider on this date.  Electronically Signed: Alwyn Ren, AGACNP-BC [phone] 02/28/2023, 4:13 PM   I spent a total of  30 Minutes   in face to face in clinical consultation, greater than 50% of which was counseling/coordinating care for port-a-catheter placement.

## 2023-03-01 ENCOUNTER — Ambulatory Visit
Admission: RE | Admit: 2023-03-01 | Discharge: 2023-03-01 | Disposition: A | Discharge status: Home / Self Care | Payer: Medicare Other | Source: Ambulatory Visit | Attending: Oncology | Admitting: Oncology

## 2023-03-01 ENCOUNTER — Encounter: Payer: Self-pay | Admitting: Oncology

## 2023-03-01 ENCOUNTER — Other Ambulatory Visit: Payer: Self-pay | Admitting: Interventional Radiology

## 2023-03-01 ENCOUNTER — Ambulatory Visit
Admission: RE | Admit: 2023-03-01 | Discharge: 2023-03-01 | Disposition: A | Discharge status: Home / Self Care | Payer: Medicare Other | Source: Ambulatory Visit | Attending: Interventional Radiology | Admitting: Interventional Radiology

## 2023-03-01 ENCOUNTER — Other Ambulatory Visit: Payer: Self-pay | Admitting: *Deleted

## 2023-03-01 DIAGNOSIS — J90 Pleural effusion, not elsewhere classified: Secondary | ICD-10-CM | POA: Diagnosis not present

## 2023-03-01 DIAGNOSIS — Z9889 Other specified postprocedural states: Secondary | ICD-10-CM

## 2023-03-01 DIAGNOSIS — C3492 Malignant neoplasm of unspecified part of left bronchus or lung: Secondary | ICD-10-CM | POA: Insufficient documentation

## 2023-03-01 HISTORY — PX: IR THORACENTESIS ASP PLEURAL SPACE W/IMG GUIDE: IMG5380

## 2023-03-01 MED ORDER — LIDOCAINE HCL 1 % IJ SOLN
INTRAMUSCULAR | Status: AC
  Filled 2023-03-01: qty 20

## 2023-03-01 MED ORDER — SODIUM CHLORIDE 0.9 % IV SOLN: INTRAVENOUS | Status: DC

## 2023-03-01 MED ORDER — LIDOCAINE HCL 1 % IJ SOLN
8.0000 mL | Freq: Once | INTRAMUSCULAR | Status: AC
  Administered 2023-03-01: 8 mL via INTRADERMAL

## 2023-03-01 NOTE — Progress Notes (Signed)
Patient here for port placement. MD and PA for radiology here to see patient due to SOB and possible need for thoracentesis today. Decision was made to have thora today and port placement on Friday. Patient taken to IR lab for procedure. Patient returned to room 22. Vitals are WNL. Patient continues to do well on 2/3 liters 02 [place]. Wife at bedside. Chest X-ray okayed per MD, patient may d/c to home. Patient taken via wheelchair to [facility] lobby for discharge to home.

## 2023-03-02 ENCOUNTER — Other Ambulatory Visit: Payer: Self-pay | Admitting: Radiology

## 2023-03-02 ENCOUNTER — Ambulatory Visit: Payer: Medicare Other

## 2023-03-02 ENCOUNTER — Encounter: Payer: Self-pay | Admitting: Oncology

## 2023-03-02 ENCOUNTER — Other Ambulatory Visit: Payer: Self-pay

## 2023-03-02 NOTE — Progress Notes (Signed)
Patient for IR Port Placement on Friday 03/03/2023, I called and spoke with the patient's wife, Olegario Messier on the phone and gave pre-procedure instructions. Olegario Messier was made aware to have the patient here at 1p, NPO after MN prior to procedure as well as driver post procedure/recovery/discharge. Olegario Messier stated understanding.  Called 03/02/2023

## 2023-03-03 ENCOUNTER — Ambulatory Visit
Admission: RE | Admit: 2023-03-03 | Discharge: 2023-03-03 | Disposition: A | Discharge status: Home / Self Care | Payer: Medicare Other | Source: Ambulatory Visit | Attending: Oncology | Admitting: Oncology

## 2023-03-03 ENCOUNTER — Encounter: Payer: Self-pay | Admitting: Radiology

## 2023-03-03 ENCOUNTER — Other Ambulatory Visit: Payer: Self-pay

## 2023-03-03 DIAGNOSIS — Z9221 Personal history of antineoplastic chemotherapy: Secondary | ICD-10-CM | POA: Insufficient documentation

## 2023-03-03 DIAGNOSIS — Z87891 Personal history of nicotine dependence: Secondary | ICD-10-CM | POA: Diagnosis not present

## 2023-03-03 DIAGNOSIS — Z85820 Personal history of malignant melanoma of skin: Secondary | ICD-10-CM | POA: Insufficient documentation

## 2023-03-03 DIAGNOSIS — Z7901 Long term (current) use of anticoagulants: Secondary | ICD-10-CM | POA: Insufficient documentation

## 2023-03-03 DIAGNOSIS — I4891 Unspecified atrial fibrillation: Secondary | ICD-10-CM | POA: Insufficient documentation

## 2023-03-03 DIAGNOSIS — Z923 Personal history of irradiation: Secondary | ICD-10-CM | POA: Insufficient documentation

## 2023-03-03 DIAGNOSIS — C3492 Malignant neoplasm of unspecified part of left bronchus or lung: Secondary | ICD-10-CM | POA: Diagnosis present

## 2023-03-03 DIAGNOSIS — I10 Essential (primary) hypertension: Secondary | ICD-10-CM | POA: Diagnosis not present

## 2023-03-03 HISTORY — PX: IR IMAGING GUIDED PORT INSERTION: IMG5740

## 2023-03-03 MED ORDER — LIDOCAINE-EPINEPHRINE 1 %-1:100000 IJ SOLN
20.0000 mL | Freq: Once | INTRAMUSCULAR | Status: AC
  Administered 2023-03-03: 20 mL via INTRADERMAL

## 2023-03-03 MED ORDER — HEPARIN SOD (PORK) LOCK FLUSH 100 UNIT/ML IV SOLN
500.0000 [IU] | Freq: Once | INTRAVENOUS | Status: AC
  Administered 2023-03-03: 500 [IU] via INTRAVENOUS

## 2023-03-03 MED ORDER — MIDAZOLAM HCL 2 MG/2ML IJ SOLN
INTRAMUSCULAR | Status: AC
  Filled 2023-03-03: qty 2

## 2023-03-03 MED ORDER — HEPARIN SOD (PORK) LOCK FLUSH 100 UNIT/ML IV SOLN
INTRAVENOUS | Status: AC
  Filled 2023-03-03: qty 5

## 2023-03-03 MED ORDER — FENTANYL CITRATE (PF) 100 MCG/2ML IJ SOLN
INTRAMUSCULAR | Status: AC
  Filled 2023-03-03: qty 2

## 2023-03-03 MED ORDER — LIDOCAINE-EPINEPHRINE 1 %-1:100000 IJ SOLN
INTRAMUSCULAR | Status: AC
Start: 2023-03-03 — End: ?
  Filled 2023-03-03: qty 1

## 2023-03-03 MED ORDER — SODIUM CHLORIDE 0.9 % IV SOLN: INTRAVENOUS | Status: DC

## 2023-03-03 MED ORDER — FENTANYL CITRATE (PF) 100 MCG/2ML IJ SOLN
INTRAMUSCULAR | Status: AC | PRN
  Administered 2023-03-03: 50 ug via INTRAVENOUS

## 2023-03-03 MED ORDER — MIDAZOLAM HCL 2 MG/2ML IJ SOLN
INTRAMUSCULAR | Status: AC | PRN
Start: 2023-03-03 — End: 2023-03-03
  Administered 2023-03-03: 1 mg via INTRAVENOUS

## 2023-03-03 MED FILL — Fosaprepitant Dimeglumine For IV Infusion 150 MG (Base Eq): INTRAVENOUS | Qty: 5 | Status: AC

## 2023-03-03 NOTE — H&P (Signed)
Chief Complaint: Patient was seen in consultation today for port a catheter placement for chemotherapy at the request of Finnegan,Timothy J  Referring Physician(s): Finnegan,Timothy J  Supervising Physician: Marliss Coots  Patient Status: [facility] - Out-pt  History of Present Illness: Levi [name] is a 82 y.o. 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 The patient has been seen by oncology 10/25 with plan to initiate chemotherapy, request received for port a catheter placement.  The patient denies any current chest pain and states his shortness of breath is better since his thoracentesis 2 days ago. The patient denies any recent infections, fever or chills. The patient denies any diagnosed sleep apnea, he does use chronic oxygen whenever he feels symptomatic. He has no known complications to sedation.   Past Medical History:  Diagnosis Date   Asthma    COPD (chronic obstructive pulmonary disease) (HCC)    Hearing loss    Hypertension    Hypothyroidism    Mass of left lung    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HCC)    Prostate enlargement    Shortness of breath    Squamous cell carcinoma of lung, left (HCC)    Thyroid disease    Tobacco abuse     Past Surgical History:  Procedure Laterality Date   CARDIOVERSION N/A    CARDIOVERSION N/A 11/24/2021   Procedure: CARDIOVERSION;  Surgeon: Yvonne Kendall, MD;  Location: [facility] ORS;  Service: Cardiovascular;  Laterality: N/A;   ELECTROMAGNETIC NAVIGATION BROCHOSCOPY Left 10/19/2018   Procedure: ELECTROMAGNETIC NAVIGATION BRONCHOSCOPY LEFT;  Surgeon: Salena Saner, MD;  Location: [facility] ORS;  Service: Cardiopulmonary;  Laterality: Left;    ENDOBRONCHIAL ULTRASOUND Left 10/19/2018   Procedure: ENDOBRONCHIAL ULTRASOUND LEFT;  Surgeon: Salena Saner, MD;  Location: [facility] ORS;  Service: Cardiopulmonary;  Laterality: Left;   IR THORACENTESIS ASP PLEURAL SPACE W/IMG GUIDE  03/01/2023   MELANOMA EXCISION Left    NO PAST SURGERIES      Allergies: Patient has no known allergies.  Medications: Prior to Admission medications   Medication Sig Start Date End Date Taking? Authorizing Provider  albuterol (PROVENTIL HFA;VENTOLIN HFA) 108 (90 Base) MCG/ACT inhaler Inhale 2 puffs into the lungs every 6 (six) hours as needed for wheezing or shortness of breath. 10/16/15   Sherren Mocha, MD  albuterol (PROVENTIL) (2.5 MG/3ML) 0.083% nebulizer solution Take 2.5 mg by nebulization every 6 (six) hours as needed for wheezing or shortness of breath.    [provider]  apixaban (ELIQUIS) 5 MG TABS tablet Take 2.5 mg by mouth 2 (two) times daily.    [provider]  carboxymethylcellulose (REFRESH PLUS) 0.5 % SOLN 1 drop 4 (four) times daily as needed.    [provider]  finasteride (PROSCAR) 5 MG tablet Take 5 mg by mouth daily.    [provider]  fluticasone-salmeterol (ADVAIR) 250-50 MCG/ACT AEPB Inhale 1 puff into the lungs in the morning and at bedtime.    [provider]  glimepiride (AMARYL) 2 MG tablet Take 2 mg by mouth daily with breakfast. Patient not taking: Reported on 02/24/2023 10/26/22   [provider]  levothyroxine (SYNTHROID) 200 MCG tablet Take 200 mcg by mouth daily before  breakfast.    [provider]  lidocaine-prilocaine (EMLA) cream Apply to affected area once 02/24/23   Jeralyn Ruths, MD  ondansetron (ZOFRAN) 8 MG tablet Take 1 tablet (8 mg total) by mouth every 8 (eight) hours as needed for nausea, vomiting or refractory nausea / vomiting. Start on the third day after carboplatin. 02/24/23   Jeralyn Ruths, MD  Oxycodone HCl 10 MG TABS Take 1 tablet  (10 mg total) by mouth every 6 (six) hours as needed. Okay to cut tablet in half if needed. 02/24/23   Jeralyn Ruths, MD  prochlorperazine (COMPAZINE) 10 MG tablet Take 1 tablet (10 mg total) by mouth every 6 (six) hours as needed for nausea or vomiting. 02/24/23   Jeralyn Ruths, MD  SEMAGLUTIDE,0.25 OR 0.5MG /DOS, [place] Inject 0.5 mg into the skin once a week.    [provider]  tamsulosin (FLOMAX) 0.4 MG CAPS capsule 0.8 mg daily. 04/05/21   [provider]  Tiotropium Bromide Monohydrate (SPIRIVA RESPIMAT) 2.5 MCG/ACT AERS Inhale 2 puffs into the lungs daily. 02/01/22   [name], MD  torsemide (DEMADEX) 20 MG tablet TAKE 1 TABLET BY MOUTH EVERY OTHER DAY 12/01/22   Duke Salvia, MD  vitamin B-12 (CYANOCOBALAMIN) 1000 MCG tablet Take 1,000 mcg by mouth daily.    [provider]     Family History  Problem Relation Age of Onset   Aneurysm Mother    Cancer Father     Social History   Socioeconomic History   Marital status: Married    Spouse name: Olegario Messier   Number of children: 5   Years of education: Not on file   Highest education level: Not on file  Occupational History   Not on file  Tobacco Use   Smoking status: Former    Current packs/day: 0.00    Average packs/day: 0.3 packs/day for 62.0 years (15.5 ttl pk-yrs)    Types: Cigarettes    Start date: 09/04/1956    Quit date: 09/05/2018    Years since quitting: 4.4   Smokeless tobacco: Never  Vaping Use   Vaping status: Never Used  Substance and Sexual Activity   Alcohol use: No    Alcohol/week: 0.0 standard drinks of alcohol   Drug use: No   Sexual activity: Not on file  Other Topics Concern   Not on file  Social History Narrative   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 He is married to his wife of 30+ years, Olegario Messier and  has 5 children (1 deceased), many grand children, and one great grand child.    Social Determinants of Health   Financial Resource Strain: Low Risk  (12/04/2018)   Overall Financial Resource Strain (CARDIA)    Difficulty of Paying Living Expenses: Not very hard  Food Insecurity: No Food Insecurity (12/04/2018)   Hunger Vital Sign    Worried About Running Out of Food in the Last Year: Never true    Ran Out of Food in the Last Year: Never true  Transportation Needs: No Transportation Needs (12/04/2018)   PRAPARE - Administrator, Civil Service (Medical): No    Lack of Transportation (Non-Medical): No  Physical Activity: Not on file  Stress: Stress Concern Present (12/04/2018)   [employer] of Occupational Health - Occupational Stress Questionnaire  Feeling of Stress : Very much  Social Connections: Unknown (12/04/2018)   Social Connection and Isolation Panel [NHANES]    Frequency of Communication with Friends and Family: More than three times a week    Frequency of Social Gatherings with Friends and Family: Once a week    Attends Religious Services: Not on Marketing executive or Organizations: Not on file    Attends Banker Meetings: Not on file    Marital Status: Not on file    Review of Systems: A 12 point ROS discussed and pertinent positives are indicated in the HPI above.  All other systems are negative.  Review of Systems  Vital Signs: BP (!) 149/94   Pulse 67   Temp 97.9 F (36.6 C) (Oral)   Resp (!) 22   Ht 6' (1.829 m)   Wt 262 lb (118.8 kg)   SpO2 91%   BMI 35.53 kg/m   Physical Exam Constitutional:      General: He is not in acute distress. HENT:     Head: Normocephalic and atraumatic.  Cardiovascular:     Rate and Rhythm: Normal rate. Rhythm irregular.  Pulmonary:     Effort: Pulmonary effort is normal. No respiratory distress.  Skin:    General: Skin is warm and dry.  Neurological:     Mental Status: He is alert and  oriented to person, place, and time.     Imaging: IR THORACENTESIS ASP PLEURAL SPACE W/IMG GUIDE  Result Date: 03/01/2023 INDICATION: Shortness of breath, lung cancer EXAM: ULTRASOUND GUIDED  THORACENTESIS MEDICATIONS: None. COMPLICATIONS: None immediate. PROCEDURE: An ultrasound guided thoracentesis was thoroughly discussed with the patient and questions answered. The benefits, risks, alternatives and complications were also discussed. The patient understands and wishes to proceed with the procedure. Written consent was obtained. Ultrasound was performed to localize and mark an adequate pocket of fluid in the left chest. The area was then prepped and draped in the normal sterile fashion. 1% Lidocaine was used for local anesthesia. Under ultrasound guidance a 19 gauge Yueh catheter was introduced. Thoracentesis was performed. The catheter was removed and a dressing applied. FINDINGS: A total of approximately 2 L of dark serosanguineous fluid was removed. IMPRESSION: Successful ultrasound guided left thoracentesis yielding 2 L of dark serosanguineous pleural fluid. Electronically Signed   By: Olive Bass M.D.   On: 03/01/2023 15:39   DG Chest Port 1 View  Result Date: 03/01/2023 CLINICAL DATA:  status post thoracentesis EXAM: PORTABLE CHEST 1 VIEW COMPARISON:  None Available. FINDINGS: The heart appears enlarged. Persistent dense opacity in the left lower lung. No pneumothorax. Right lung remains relatively well aerated. No acute osseous abnormality. IMPRESSION: No pneumothorax following left thoracentesis. Persistent left lower lung opacity, likely combination of residual effusion and consolidation. Electronically Signed   By: Olive Bass M.D.   On: 03/01/2023 14:58   DG Chest Port 1 View  Result Date: 02/17/2023 CLINICAL DATA:  Status post left pleural nodule biopsy and status post left thoracentesis. EXAM: PORTABLE CHEST 1 VIEW COMPARISON:  01/10/2023. FINDINGS: Negative for pneumothorax  following the left thoracentesis and left pleural nodule biopsy. Again noted is a left perihilar mass. Opacities at the left lung base could represent residual pleural fluid and known pleural disease. Trachea is midline. Heart size is upper limits of normal but stable. IMPRESSION: 1. Negative for pneumothorax following left thoracentesis and left pleural nodule biopsy. 2. Left perihilar mass. 3. Left basilar opacities could represent  residual pleural fluid. Electronically Signed   By: Richarda Overlie M.D.   On: 02/17/2023 14:25   CT LUNG MASS BIOPSY  Result Date: 02/17/2023 INDICATION: 82 year old with history of lung cancer. Evidence for disease progression or recurrence. Patient needs a tissue diagnosis. EXAM: 1. CT-guided biopsy of left pleural nodule 2. CT-guided left thoracentesis TECHNIQUE: Multidetector CT imaging of the chest was performed following the standard protocol without IV contrast. RADIATION DOSE REDUCTION: This exam was performed according to the departmental dose-optimization program which includes automated exposure control, adjustment of the mA and/or kV according to patient size and/or use of iterative reconstruction technique. MEDICATIONS: Moderate sedation ANESTHESIA/SEDATION: Moderate (conscious) sedation was employed during this procedure. A total of Versed 0.5 mg and Fentanyl 25 mcg was administered intravenously by the radiology nurse. Total intra-service moderate Sedation Time: 14 minutes. The patient's level of consciousness and vital signs were monitored continuously by radiology nursing throughout the procedure under my direct supervision. COMPLICATIONS: None immediate. PROCEDURE: Informed written consent was obtained from the patient after a thorough discussion of the procedural risks, benefits and alternatives. All questions were addressed. A timeout was performed prior to the initiation of the procedure. Patient was placed on his right side. CT images through the chest were  obtained. Pleural nodularity in the posterior left lower chest was targeted for biopsy. In addition, the left side of the chest was evaluated with ultrasound and demonstrated that the pleural fluid was relatively simple. The left side of the back was prepped with chlorhexidine and sterile field was created. Skin was anesthetized using 1% lidocaine. Small incision was made. Using CT guidance, 17 gauge coaxial needle was directed into a posterior left pleural nodule. Multiple core biopsies were obtained with an 18 gauge device. Specimens placed in formalin. Attention was directed to performing at thoracentesis. Using CT guidance, a Yueh catheter was directed into the pleural fluid along the posterior lower aspect of the left chest. Dark red fluid was aspirated. Approximately 1 L of fluid was removed. Follow up CT images were obtained. Yueh catheter was removed. Bandages were placed at both puncture sites. FINDINGS: CT images demonstrate a large left perihilar mass and extensive left pleural nodularity. There is also a nodule in the right upper lobe. Biopsy needle confirmed within a posterior left pleural nodule. 1 L of dark red fluid was removed from the left pleural space. IMPRESSION: CT-guided biopsy of a left pleural nodule and CT-guided left thoracentesis. Electronically Signed   By: Richarda Overlie M.D.   On: 02/17/2023 14:23   CT GUIDED NEEDLE PLACEMENT  Result Date: 02/17/2023 INDICATION: 82 year old with history of lung cancer. Evidence for disease progression or recurrence. Patient needs a tissue diagnosis. EXAM: 1. CT-guided biopsy of left pleural nodule 2. CT-guided left thoracentesis TECHNIQUE: Multidetector CT imaging of the chest was performed following the standard protocol without IV contrast. RADIATION DOSE REDUCTION: This exam was performed according to the departmental dose-optimization program which includes automated exposure control, adjustment of the mA and/or kV according to patient size and/or  use of iterative reconstruction technique. MEDICATIONS: Moderate sedation ANESTHESIA/SEDATION: Moderate (conscious) sedation was employed during this procedure. A total of Versed 0.5 mg and Fentanyl 25 mcg was administered intravenously by the radiology nurse. Total intra-service moderate Sedation Time: 14 minutes. The patient's level of consciousness and vital signs were monitored continuously by radiology nursing throughout the procedure under my direct supervision. COMPLICATIONS: None immediate. PROCEDURE: Informed written consent was obtained from the patient after a thorough discussion of the procedural  risks, benefits and alternatives. All questions were addressed. A timeout was performed prior to the initiation of the procedure. Patient was placed on his right side. CT images through the chest were obtained. Pleural nodularity in the posterior left lower chest was targeted for biopsy. In addition, the left side of the chest was evaluated with ultrasound and demonstrated that the pleural fluid was relatively simple. The left side of the back was prepped with chlorhexidine and sterile field was created. Skin was anesthetized using 1% lidocaine. Small incision was made. Using CT guidance, 17 gauge coaxial needle was directed into a posterior left pleural nodule. Multiple core biopsies were obtained with an 18 gauge device. Specimens placed in formalin. Attention was directed to performing at thoracentesis. Using CT guidance, a Yueh catheter was directed into the pleural fluid along the posterior lower aspect of the left chest. Dark red fluid was aspirated. Approximately 1 L of fluid was removed. Follow up CT images were obtained. Yueh catheter was removed. Bandages were placed at both puncture sites. FINDINGS: CT images demonstrate a large left perihilar mass and extensive left pleural nodularity. There is also a nodule in the right upper lobe. Biopsy needle confirmed within a posterior left pleural nodule. 1 L  of dark red fluid was removed from the left pleural space. IMPRESSION: CT-guided biopsy of a left pleural nodule and CT-guided left thoracentesis. Electronically Signed   By: Richarda Overlie M.D.   On: 02/17/2023 14:23   NM PET Image Restag (PS) Skull Base To Thigh  Result Date: 02/14/2023 CLINICAL DATA:  Subsequent treatment strategy for squamous cell carcinoma of the left lung with progressive metastatic disease on recent CT. EXAM: NUCLEAR MEDICINE PET SKULL BASE TO THIGH TECHNIQUE: 13.19 mCi F-18 FDG was injected intravenously. Full-ring PET imaging was performed from the skull base to thigh after the radiotracer. CT data was obtained and used for attenuation correction and anatomic localization. Fasting blood glucose: 122 mg/dl COMPARISON:  Chest CT 62/13/0865 and 01/31/2022.  PET-CT 04/15/2019. FINDINGS: Mediastinal blood pool activity: SUV max 3.0 NECK: No hypermetabolic cervical lymph nodes are identified. No suspicious activity identified within the pharyngeal mucosal space. Incidental CT findings: Bilateral carotid atherosclerosis. CHEST: As seen on recent CT, there are multiple enlarged mediastinal and left hilar lymph nodes which are hypermetabolic on PET-CT. The largest lymph node is in the left infrahilar region measuring 3.8 x 3.3 cm on image 64/6 (SUV max 10.8). The large left hilar mass is intensely hypermetabolic. This measures approximately 7.8 x 5.9 cm on image 53/6 and has an SUV max of 10.3. 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 Persistent moderate-sized left pleural effusion with subtotal left lung collapse. The cavitary right upper lobe nodule seen on recent CT is also hypermetabolic. This nodule measures 1.6 x 1.5 cm on image 40/6 and has an SUV max of 12.5. No other suspicious nodularity or hypermetabolic activity identified in the right hemithorax. Incidental CT findings:  Centrilobular emphysema. Atherosclerosis of the aorta, great vessels and coronary arteries. ABDOMEN/PELVIS: There is no hypermetabolic activity within the liver, adrenal glands, spleen or pancreas. There is no hypermetabolic nodal activity in the abdomen or pelvis. Bowel activity is within physiologic limits. Pleural based tumor in the left hemithorax may involve the left hemithorax, especially posteriorly. Incidental CT findings: Stable right renal cyst. Mild aortic and branch vessel atherosclerosis. There are diverticular changes throughout the sigmoid colon without evidence of acute inflammation. SKELETON:  There is no hypermetabolic activity to suggest osseous metastatic disease. Incidental CT findings: Multilevel spondylosis. IMPRESSION: 1. 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 Multiple hypermetabolic mediastinal nodal metastases. 2. Hypermetabolic cavitary right upper lobe pulmonary nodule, also suspicious for metastatic disease versus metachronous primary lung cancer. 3. No evidence of metastatic disease in the abdomen or pelvis. 4. Coronary artery atherosclerosis. Aortic Atherosclerosis (ICD10-I70.0) and Emphysema (ICD10-J43.9). Electronically Signed   By: Carey Bullocks M.D.   On: 02/14/2023 13:30   MR Brain W Wo Contrast  Result Date: 02/14/2023 CLINICAL DATA:  82 year old male with lung cancer. Progression on recent Chest CT. Staging. EXAM: MRI HEAD WITHOUT AND WITH CONTRAST TECHNIQUE: Multiplanar, multiecho pulse sequences of the brain and surrounding structures were obtained without and with intravenous contrast. CONTRAST:  10mL GADAVIST GADOBUTROL 1 MMOL/ML IV SOLN COMPARISON:  Chest CT 02/03/2023.  PET-CT 04/15/2019. FINDINGS: Brain: Cerebral volume is within normal limits for age. No midline shift, mass effect,  or evidence of intracranial mass lesion. No abnormal enhancement identified. No dural thickening identified. No restricted diffusion to suggest acute infarction. No ventriculomegaly, extra-axial collection or acute intracranial hemorrhage. Cervicomedullary junction and pituitary are within normal limits. Mild for age patchy bilateral white matter T2 and FLAIR hyperintensity. No cortical encephalomalacia identified. No chronic cerebral blood products on SWI. Deep gray nuclei and brainstem appear normal for age. There is a tiny chronic lacunar infarct in the posterior right cerebellum on series 8, image 8. Vascular: Major intracranial vascular flow voids are preserved. Following contrast the major dural venous sinuses are enhancing and appear to be patent. Skull and upper cervical spine: Negative visible cervical spine, spinal cord. Visualized bone marrow signal is within normal limits. Sinuses/Orbits: Negative. Other: Mastoids are well aerated. Visible internal auditory structures appear normal. Negative visible scalp and face. IMPRESSION: 1.  No metastatic disease or acute intracranial abnormality. 2. 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 Electronically Signed   By: Odessa Fleming M.D.   On: 02/14/2023 12:18   CT Chest W Contrast  Result Date: 02/03/2023 CLINICAL DATA:  Left lung cancer restaging * Tracking Code: BO * EXAM: CT CHEST WITH CONTRAST TECHNIQUE: Multidetector CT imaging of the chest was performed during intravenous contrast administration. RADIATION DOSE REDUCTION: This exam was performed according to the departmental dose-optimization program which includes automated exposure control, adjustment of the mA and/or kV according to patient size and/or use of iterative reconstruction technique. CONTRAST:  75mL OMNIPAQUE IOHEXOL 300 MG/ML  SOLN COMPARISON:  Chest radiograph, 01/10/2023, CT chest, 01/31/2022 FINDINGS: Cardiovascular: Aortic atherosclerosis. Mild cardiomegaly. Left and right coronary  artery calcifications no pericardial effusion. Mediastinum/Nodes: Numerous newly enlarged mediastinal and left hilar lymph nodes, largest measuring 2.3 x 1.7 cm (series 2, image 59). Thyroid gland, trachea, and esophagus demonstrate no significant findings. Lungs/Pleura: Severe emphysema and diffuse bilateral bronchial wall thickening. New, large bulky left hilar mass measuring 7.3 x 6.0 cm (series 2, image 61). New, moderate left pleural effusion with extensive pleural nodularity throughout the left hemithorax, largest index nodule about the medial left lower hemithorax measures 3.8 x 3.3 cm (series 2, image 113). New, cavitary nodule of the right pulmonary apex measuring 1.6 x 1.5 cm (series 4, image 31). Upper Abdomen: No acute abnormality.  Hepatic steatosis. Musculoskeletal: No chest wall abnormality. No acute osseous findings. Disc degenerative disease and bridging osteophytosis throughout the thoracic spine. IMPRESSION:  1. New, large bulky left hilar mass. 2. New, moderate left pleural effusion with extensive pleural nodularity throughout the left hemithorax. 3. New, cavitary nodule of the right pulmonary apex. 4. Numerous newly enlarged mediastinal and left hilar lymph nodes. 5. Constellation of findings is consistent with recurrent left hilar malignancy with pleural, nodal, and contralateral pulmonary metastatic disease. 6. Severe emphysema and diffuse bilateral bronchial wall thickening. 7. Coronary artery disease. 8. Hepatic steatosis. These results will be called to the ordering clinician or representative by the Radiologist Assistant, and communication documented in the PACS or [employer]. Aortic Atherosclerosis (ICD10-I70.0) and Emphysema (ICD10-J43.9). Electronically Signed   By: Jearld Lesch M.D.   On: 02/03/2023 12:08    Labs:  CBC: Recent Labs    02/17/23 1051 02/27/23 0838  WBC 7.3 6.4  HGB 16.6 15.9  HCT 51.4 49.9  PLT 221 212    COAGS: Recent Labs    02/17/23 1051  INR  1.2    BMP: Recent Labs    02/03/23 1027 02/27/23 0838  NA  --  136  K  --  4.2  CL  --  106  CO2  --  23  GLUCOSE  --  132*  BUN  --  24*  CALCIUM  --  8.6*  CREATININE 1.50* 1.46*  GFRNONAA  --  48*    LIVER FUNCTION TESTS: Recent Labs    02/27/23 0838  BILITOT 0.8  AST 22  ALT 12  ALKPHOS 55  PROT 6.2*  ALBUMIN 3.3*    Assessment and Plan: This is a 82 year old male with PMHx of tobacco use, atrail fibrillation on eliquis, emphysema, HTN, melanoma, squamous cell carcinoma LUL lung in 2020 now with 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 The patient has been seen by oncology 10/25 with plan to initiate chemotherapy, request received for port a catheter placement.  The patient has been NPO, imaging, labs and vitals have been reviewed.  Risks and benefits of image guided port-a-catheter placement was discussed with the patient and his wife including, but not limited to bleeding, infection, pneumothorax, or fibrin sheath development and need for additional procedures.  All of the patient's questions were answered, patient is agreeable to proceed. Consent signed and in chart.   Thank you for this interesting consult.  I greatly enjoyed meeting RYKAR LEBLEU and look forward to participating in their care.  A copy of this report was sent to the requesting provider on this date.  Electronically Signed: Berneta Levins, PA-C 03/03/2023, 1:58 PM   I spent a total of 15 Minutes in face to face in clinical consultation, greater than 50% of which was counseling/coordinating care for port a catheter placement for chemotherapy.

## 2023-03-03 NOTE — Discharge Instructions (Signed)
Implanted Port Home Guide  An implanted port is a type of central line that is placed under the skin. Central lines are used to provide IV access when treatment or nutrition needs to be given through a person's veins. Implanted ports are used for long-term IV access. An implanted port may be placed because: You need IV medicine that would be irritating to the small veins in your hands or arms. You need long-term IV medicines, such as antibiotics. You need IV nutrition for a long period. You need frequent blood draws for lab tests. You need dialysis.   Implanted ports are usually placed in the chest area, but they can also be placed in the upper arm, the abdomen, or the leg. An implanted port has two main parts: Reservoir. The reservoir is round and will appear as a small, raised area under your skin. The reservoir is the part where a needle is inserted to give medicines or draw blood. Catheter. The catheter is a thin, flexible tube that extends from the reservoir. The catheter is placed into a large vein. Medicine that is inserted into the reservoir goes into the catheter and then into the vein.   How will I care for my incision  You may shower tomorrow Please remove dressing in 24hrs not other skin care is needed  How is my port accessed? Special steps must be taken to access the port: Before the port is accessed, a numbing cream can be placed on the skin. This helps numb the skin over the port site. Your health care provider uses a sterile technique to access the port. Your health care provider must put on a mask and sterile gloves. The skin over your port is cleaned carefully with an antiseptic and allowed to dry. The port is gently pinched between sterile gloves, and a needle is inserted into the port. Only "non-coring" port needles should be used to access the port. Once the port is accessed, a blood return should be checked. This helps ensure that the port is in the vein and is not  clogged. If your port needs to remain accessed for a constant infusion, a clear (transparent) bandage will be placed over the needle site. The bandage and needle will need to be changed every week, or as directed by your health care provider.   What is flushing? Flushing helps keep the port from getting clogged. Follow your health care provider's instructions on how and when to flush the port. Ports are usually flushed with saline solution or a medicine called heparin. The need for flushing will depend on how the port is used. If the port is used for intermittent medicines or blood draws, the port will need to be flushed: After medicines have been given. After blood has been drawn. As part of routine maintenance. If a constant infusion is running, the port may not need to be flushed.   How long will my port stay implanted? The port can stay in for as long as your health care provider thinks it is needed. When it is time for the port to come out, surgery will be done to remove it. The procedure is similar to the one performed when the port was put in. When should I seek immediate medical care? When you have an implanted port, you should seek immediate medical care if: You notice a bad smell coming from the incision site. You have swelling, redness, or drainage at the incision site. You have more swelling or pain at the   port site or the surrounding area. You have a fever that is not controlled with medicine.   This information is not intended to replace advice given to you by your health care provider. Make sure you discuss any questions you have with your health care provider. Document Released: 04/18/2005 Document Revised: 09/24/2015 Document Reviewed: 12/24/2012 Elsevier Interactive Patient Education  2017 [employer]   

## 2023-03-03 NOTE — Procedures (Signed)
Interventional Radiology Procedure Note ° °Procedure: Single Lumen Power Port Placement   ° °Access:  Right internal jugular vein ° °Findings: Catheter tip positioned at cavoatrial junction. Port is ready for immediate use.  ° °Complications: None ° °EBL: < 10 mL ° °Recommendations:  °- Ok to shower in 24 hours °- Do not submerge for 7 days °- Routine line care  ° ° °Mithran Strike, MD ° ° ° °

## 2023-03-06 ENCOUNTER — Inpatient Hospital Stay: Payer: Medicare Other | Attending: Oncology

## 2023-03-06 ENCOUNTER — Inpatient Hospital Stay (HOSPITAL_BASED_OUTPATIENT_CLINIC_OR_DEPARTMENT_OTHER): Payer: Medicare Other | Admitting: Oncology

## 2023-03-06 ENCOUNTER — Encounter: Payer: Self-pay | Admitting: Oncology

## 2023-03-06 ENCOUNTER — Inpatient Hospital Stay: Payer: Medicare Other

## 2023-03-06 VITALS — BP 109/50 | HR 73 | Temp 97.1°F | Resp 22

## 2023-03-06 DIAGNOSIS — Z79899 Other long term (current) drug therapy: Secondary | ICD-10-CM | POA: Insufficient documentation

## 2023-03-06 DIAGNOSIS — Z5112 Encounter for antineoplastic immunotherapy: Secondary | ICD-10-CM | POA: Insufficient documentation

## 2023-03-06 DIAGNOSIS — Z5111 Encounter for antineoplastic chemotherapy: Secondary | ICD-10-CM | POA: Insufficient documentation

## 2023-03-06 DIAGNOSIS — F411 Generalized anxiety disorder: Secondary | ICD-10-CM | POA: Diagnosis not present

## 2023-03-06 DIAGNOSIS — R059 Cough, unspecified: Secondary | ICD-10-CM | POA: Diagnosis not present

## 2023-03-06 DIAGNOSIS — R0602 Shortness of breath: Secondary | ICD-10-CM | POA: Diagnosis not present

## 2023-03-06 DIAGNOSIS — C3412 Malignant neoplasm of upper lobe, left bronchus or lung: Secondary | ICD-10-CM | POA: Insufficient documentation

## 2023-03-06 DIAGNOSIS — Z85828 Personal history of other malignant neoplasm of skin: Secondary | ICD-10-CM | POA: Diagnosis not present

## 2023-03-06 DIAGNOSIS — C3492 Malignant neoplasm of unspecified part of left bronchus or lung: Secondary | ICD-10-CM | POA: Diagnosis not present

## 2023-03-06 DIAGNOSIS — F32A Depression, unspecified: Secondary | ICD-10-CM | POA: Diagnosis not present

## 2023-03-06 DIAGNOSIS — Z5189 Encounter for other specified aftercare: Secondary | ICD-10-CM | POA: Insufficient documentation

## 2023-03-06 DIAGNOSIS — F419 Anxiety disorder, unspecified: Secondary | ICD-10-CM | POA: Insufficient documentation

## 2023-03-06 DIAGNOSIS — N289 Disorder of kidney and ureter, unspecified: Secondary | ICD-10-CM | POA: Diagnosis not present

## 2023-03-06 LAB — CBC WITH DIFFERENTIAL (CANCER CENTER ONLY)
Abs Immature Granulocytes: 0.03 10*3/uL (ref 0.00–0.07)
Basophils Absolute: 0.1 10*3/uL (ref 0.0–0.1)
Basophils Relative: 1 %
Eosinophils Absolute: 0.1 10*3/uL (ref 0.0–0.5)
Eosinophils Relative: 2 %
HCT: 47.7 % (ref 39.0–52.0)
Hemoglobin: 15.5 g/dL (ref 13.0–17.0)
Immature Granulocytes: 1 %
Lymphocytes Relative: 19 %
Lymphs Abs: 1.1 10*3/uL (ref 0.7–4.0)
MCH: 30.5 pg (ref 26.0–34.0)
MCHC: 32.5 g/dL (ref 30.0–36.0)
MCV: 93.9 fL (ref 80.0–100.0)
Monocytes Absolute: 0.8 10*3/uL (ref 0.1–1.0)
Monocytes Relative: 15 %
Neutro Abs: 3.4 10*3/uL (ref 1.7–7.7)
Neutrophils Relative %: 62 %
Platelet Count: 191 10*3/uL (ref 150–400)
RBC: 5.08 MIL/uL (ref 4.22–5.81)
RDW: 12.9 % (ref 11.5–15.5)
WBC Count: 5.5 10*3/uL (ref 4.0–10.5)
nRBC: 0 % (ref 0.0–0.2)

## 2023-03-06 LAB — TSH: TSH: 2.378 u[IU]/mL (ref 0.350–4.500)

## 2023-03-06 LAB — CMP (CANCER CENTER ONLY)
ALT: 11 U/L (ref 0–44)
AST: 19 U/L (ref 15–41)
Albumin: 3.1 g/dL — ABNORMAL LOW (ref 3.5–5.0)
Alkaline Phosphatase: 54 U/L (ref 38–126)
Anion gap: 7 (ref 5–15)
BUN: 23 mg/dL (ref 8–23)
CO2: 23 mmol/L (ref 22–32)
Calcium: 8.3 mg/dL — ABNORMAL LOW (ref 8.9–10.3)
Chloride: 105 mmol/L (ref 98–111)
Creatinine: 1.67 mg/dL — ABNORMAL HIGH (ref 0.61–1.24)
GFR, Estimated: 41 mL/min — ABNORMAL LOW (ref >60)
Glucose, Bld: 109 mg/dL — ABNORMAL HIGH (ref 70–99)
Potassium: 3.6 mmol/L (ref 3.5–5.1)
Sodium: 135 mmol/L (ref 135–145)
Total Bilirubin: 0.7 mg/dL (ref <1.2)
Total Protein: 5.7 g/dL — ABNORMAL LOW (ref 6.5–8.1)

## 2023-03-06 MED ORDER — HEPARIN SOD (PORK) LOCK FLUSH 100 UNIT/ML IV SOLN
500.0000 [IU] | Freq: Once | INTRAVENOUS | Status: AC | PRN
  Administered 2023-03-06: 500 [IU]
  Filled 2023-03-06: qty 5

## 2023-03-06 MED ORDER — SODIUM CHLORIDE 0.9 % IV SOLN
1200.0000 mg | Freq: Once | INTRAVENOUS | Status: AC
  Administered 2023-03-06: 1200 mg via INTRAVENOUS
  Filled 2023-03-06: qty 20

## 2023-03-06 MED ORDER — SODIUM CHLORIDE 0.9 % IV SOLN
INTRAVENOUS | Status: DC
Start: 2023-03-06 — End: 2023-03-06
  Filled 2023-03-06: qty 250

## 2023-03-06 MED ORDER — DEXAMETHASONE SODIUM PHOSPHATE 10 MG/ML IJ SOLN
10.0000 mg | Freq: Once | INTRAMUSCULAR | Status: AC
  Administered 2023-03-06: 10 mg via INTRAVENOUS
  Filled 2023-03-06: qty 1

## 2023-03-06 MED ORDER — PALONOSETRON HCL INJECTION 0.25 MG/5ML
0.2500 mg | Freq: Once | INTRAVENOUS | Status: AC
  Administered 2023-03-06: 0.25 mg via INTRAVENOUS
  Filled 2023-03-06: qty 5

## 2023-03-06 MED ORDER — SODIUM CHLORIDE 0.9 % IV SOLN
150.0000 mg | Freq: Once | INTRAVENOUS | Status: AC
  Administered 2023-03-06: 150 mg via INTRAVENOUS
  Filled 2023-03-06: qty 150

## 2023-03-06 MED ORDER — SODIUM CHLORIDE 0.9 % IV SOLN
100.0000 mg/m2 | Freq: Once | INTRAVENOUS | Status: AC
  Administered 2023-03-06: 250 mg via INTRAVENOUS
  Filled 2023-03-06: qty 12.5

## 2023-03-06 MED ORDER — SODIUM CHLORIDE 0.9 % IV SOLN
429.5000 mg | Freq: Once | INTRAVENOUS | Status: AC
  Administered 2023-03-06: 430 mg via INTRAVENOUS
  Filled 2023-03-06: qty 43

## 2023-03-06 NOTE — Patient Instructions (Signed)
[facility]  Discharge Instructions: Thank you for choosing [facility] to provide your oncology and hematology care.  If you have a lab appointment with the Cancer Center, please go directly to the Cancer Center and check in at the registration area.  Wear comfortable clothing and clothing appropriate for easy access to any Portacath or PICC line.   We strive to give you quality time with your provider. You may need to reschedule your appointment if you arrive late (15 or more minutes).  Arriving late affects you and other patients whose appointments are after yours.  Also, if you miss three or more appointments without notifying the office, you may be dismissed from the clinic at the provider's discretion.      For prescription refill requests, have your pharmacy contact our office and allow 72 hours for refills to be completed.    Today you received the following chemotherapy and/or immunotherapy agents tecentriq, carboplatin, and etoposide      To help prevent nausea and vomiting after your treatment, we encourage you to take your nausea medication as directed.  BELOW ARE SYMPTOMS THAT SHOULD BE REPORTED IMMEDIATELY: *FEVER GREATER THAN 100.4 F (38 C) OR HIGHER *CHILLS OR SWEATING *NAUSEA AND VOMITING THAT IS NOT CONTROLLED WITH YOUR NAUSEA MEDICATION *UNUSUAL SHORTNESS OF BREATH *UNUSUAL BRUISING OR BLEEDING *URINARY PROBLEMS (pain or burning when urinating, or frequent urination) *BOWEL PROBLEMS (unusual diarrhea, constipation, pain near the anus) TENDERNESS IN MOUTH AND THROAT WITH OR WITHOUT PRESENCE OF ULCERS (sore throat, sores in mouth, or a toothache) UNUSUAL RASH, SWELLING OR PAIN  UNUSUAL VAGINAL DISCHARGE OR ITCHING   Items with * indicate a potential emergency and should be followed up as soon as possible or go to the Emergency Department if any problems should occur.  Please show the CHEMOTHERAPY ALERT CARD or  IMMUNOTHERAPY ALERT CARD at check-in to the Emergency Department and triage nurse.  Should you have questions after your visit or need to cancel or reschedule your appointment, please contact [facility]  [phone] and follow the prompts.  Office hours are 8:00 a.m. to 4:30 p.m. Monday - Friday. Please note that voicemails left after 4:00 p.m. may not be returned until the following business day.  We are closed weekends and major holidays. You have access to a nurse at all times for urgent questions. Please call the main number to the clinic [phone] and follow the prompts.  For any non-urgent questions, you may also contact your provider using MyChart. We now offer e-Visits for anyone 15 and older to request care online for non-urgent symptoms. For details visit [email or web address].   Also download the MyChart app! Go to the app store, search "MyChart", open the app, select , and log in with your MyChart username and password.

## 2023-03-06 NOTE — Progress Notes (Signed)
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  INTERVAL HISTORY: Patient returns to clinic today for further evaluation and initiation of cycle 1 of carboplatin, etoposide, Tecentriq.  He continues to have shortness of breath and now requires oxygen.  He has pain at the site of his thoracentesis, but otherwise feels well.  He remains anxious.  He has no neurologic complaints.  He denies any recent fevers or illnesses.  He denies any chest pain, cough, or hemoptysis.  He has a good appetite and denies weight loss.  He denies any nausea, vomiting, constipation, or diarrhea.  He has no urinary complaints.  Patient offers no further specific complaints today.  REVIEW OF SYSTEMS:   Review of Systems  Constitutional:  Positive for malaise/fatigue. Negative for fever and weight loss.  HENT: Negative.  Negative for congestion.   Respiratory:  Positive for cough and shortness of breath. Negative for hemoptysis.   Cardiovascular: Negative.  Negative for chest pain and leg swelling.  Gastrointestinal: Negative.  Negative for abdominal pain and nausea.  Genitourinary: Negative.  Negative for dysuria.  Musculoskeletal: Negative.  Negative for back pain.  Skin:  Negative for rash.  Neurological: Negative.  Negative for dizziness, focal weakness, weakness and headaches.  Endo/Heme/Allergies:  Does not bruise/bleed  easily.  Psychiatric/Behavioral:  Negative for depression. The patient is nervous/anxious.     As per HPI. Otherwise, a complete review of systems is negative.  PAST MEDICAL HISTORY: Past Medical History:  Diagnosis Date   Asthma    COPD (chronic obstructive pulmonary disease) (HCC)    Hearing loss    Hypertension    Hypothyroidism    Mass of left lung    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HCC)    Prostate enlargement    Shortness of breath    Squamous cell carcinoma of lung, left (HCC)    Thyroid disease    Tobacco abuse     PAST SURGICAL HISTORY: Past Surgical History:  Procedure Laterality Date   CARDIOVERSION N/A    CARDIOVERSION N/A 11/24/2021   Procedure: CARDIOVERSION;  Surgeon: Yvonne Kendall, MD;  Location: [facility] ORS;  Service: Cardiovascular;  Laterality: N/A;   ELECTROMAGNETIC NAVIGATION BROCHOSCOPY Left 10/19/2018   Procedure: ELECTROMAGNETIC NAVIGATION BRONCHOSCOPY LEFT;  Surgeon: Salena Saner, MD;  Location: [facility] ORS;  Service: Cardiopulmonary;  Laterality: Left;   ENDOBRONCHIAL ULTRASOUND Left 10/19/2018   Procedure: ENDOBRONCHIAL ULTRASOUND LEFT;  Surgeon: Salena Saner, MD;  Location: [facility] ORS;  Service: Cardiopulmonary;  Laterality: Left;   IR IMAGING GUIDED PORT INSERTION  03/03/2023   IR THORACENTESIS ASP PLEURAL SPACE W/IMG GUIDE  03/01/2023   MELANOMA EXCISION Left    NO PAST SURGERIES      FAMILY HISTORY: Family History  Problem Relation Age of Onset   Aneurysm Mother    Cancer Father     ADVANCED DIRECTIVES (Y/N):  N  HEALTH MAINTENANCE: Social History   Tobacco Use   Smoking status: Former    Current packs/day: 0.00  Average packs/day: 0.3 packs/day for 62.0 years (15.5 ttl pk-yrs)    Types: Cigarettes    Start date: 09/04/1956    Quit date: 09/05/2018    Years since quitting: 4.5   Smokeless tobacco: Never  Vaping Use   Vaping status: Never Used  Substance Use Topics   Alcohol use: No    Alcohol/week: 0.0 standard drinks of  alcohol   Drug use: No     Colonoscopy:  PAP:  Bone density:  Lipid panel:  No Known Allergies  Current Outpatient Medications  Medication Sig Dispense Refill   albuterol (PROVENTIL HFA;VENTOLIN HFA) 108 (90 Base) MCG/ACT inhaler Inhale 2 puffs into the lungs every 6 (six) hours as needed for wheezing or shortness of breath. 1 Inhaler 0   albuterol (PROVENTIL) (2.5 MG/3ML) 0.083% nebulizer solution Take 2.5 mg by nebulization every 6 (six) hours as needed for wheezing or shortness of breath.     apixaban (ELIQUIS) 5 MG TABS tablet Take 2.5 mg by mouth 2 (two) times daily.     carboxymethylcellulose (REFRESH PLUS) 0.5 % SOLN 1 drop 4 (four) times daily as needed.     finasteride (PROSCAR) 5 MG tablet Take 5 mg by mouth daily.     fluticasone-salmeterol (ADVAIR) 250-50 MCG/ACT AEPB Inhale 1 puff into the lungs in the morning and at bedtime.     levothyroxine (SYNTHROID) 200 MCG tablet Take 200 mcg by mouth daily before breakfast.     lidocaine-prilocaine (EMLA) cream Apply to affected area once 30 g 3   ondansetron (ZOFRAN) 8 MG tablet Take 1 tablet (8 mg total) by mouth every 8 (eight) hours as needed for nausea, vomiting or refractory nausea / vomiting. Start on the third day after carboplatin. 60 tablet 2   Oxycodone HCl 10 MG TABS Take 1 tablet (10 mg total) by mouth every 6 (six) hours as needed. Okay to cut tablet in half if needed. 60 tablet 0   prochlorperazine (COMPAZINE) 10 MG tablet Take 1 tablet (10 mg total) by mouth every 6 (six) hours as needed for nausea or vomiting. 60 tablet 2   SEMAGLUTIDE,0.25 OR 0.5MG /DOS, Geronimo Inject 0.5 mg into the skin once a week.     tamsulosin (FLOMAX) 0.4 MG CAPS capsule 0.8 mg daily.     Tiotropium Bromide Monohydrate (SPIRIVA RESPIMAT) 2.5 MCG/ACT AERS Inhale 2 puffs into the lungs daily. 1 each 11   torsemide (DEMADEX) 20 MG tablet TAKE 1 TABLET BY MOUTH EVERY OTHER DAY 45 tablet 2   vitamin B-12 (CYANOCOBALAMIN) 1000 MCG tablet Take 1,000 mcg  by mouth daily.     glimepiride (AMARYL) 2 MG tablet Take 2 mg by mouth daily with breakfast. (Patient not taking: Reported on 02/24/2023)     No current facility-administered medications for this visit.   Facility-Administered Medications Ordered in Other Visits  Medication Dose Route Frequency Provider Last Rate Last Admin   0.9 %  sodium chloride infusion   Intravenous Continuous Jeralyn Ruths, MD 10 mL/hr at 03/06/23 0938 New Bag at 03/06/23 0938   etoposide (VEPESID) 250 mg in sodium chloride 0.9 % 1,000 mL chemo infusion  100 mg/m2 (Treatment Plan Recorded) Intravenous Once Jeralyn Ruths, MD 1,013 mL/hr at 03/06/23 1219 250 mg at 03/06/23 1219    OBJECTIVE: Vitals:   03/06/23 0826  BP: 117/65  Pulse: 87  Resp: 18  Temp: 97.6 F (36.4 C)  SpO2: 91%      Body mass index is 36.35 kg/m.    ECOG  FS:1 - Symptomatic but completely ambulatory  General: Well-developed, well-nourished, no acute distress.  Sitting in a wheelchair. Eyes: Pink conjunctiva, anicteric sclera. HEENT: Normocephalic, moist mucous membranes. Lungs: No audible wheezing or coughing. Heart: Regular rate and rhythm. Abdomen: Soft, nontender, no obvious distention. Musculoskeletal: No edema, cyanosis, or clubbing. Neuro: Alert, answering all questions appropriately. Cranial nerves grossly intact. Skin: No rashes or petechiae noted. Psych: Normal affect.   LAB RESULTS:  Lab Results  Component Value Date   NA 135 03/06/2023   K 3.6 03/06/2023   CL 105 03/06/2023   CO2 23 03/06/2023   GLUCOSE 109 (H) 03/06/2023   BUN 23 03/06/2023   CREATININE 1.67 (H) 03/06/2023   CALCIUM 8.3 (L) 03/06/2023   PROT 5.7 (L) 03/06/2023   ALBUMIN 3.1 (L) 03/06/2023   AST 19 03/06/2023   ALT 11 03/06/2023   ALKPHOS 54 03/06/2023   BILITOT 0.7 03/06/2023   GFRNONAA 41 (L) 03/06/2023   GFRAA 58 (L) 07/18/2019    Lab Results  Component Value Date   WBC 5.5 03/06/2023   NEUTROABS 3.4 03/06/2023   HGB 15.5  03/06/2023   HCT 47.7 03/06/2023   MCV 93.9 03/06/2023   PLT 191 03/06/2023     STUDIES: IR IMAGING GUIDED PORT INSERTION  Result Date: 03/03/2023 INDICATION: 82 year old male with history of advanced stage lung cancer requiring central venous access for chemotherapy. EXAM: IMPLANTED PORT A CATH PLACEMENT WITH ULTRASOUND AND FLUOROSCOPIC GUIDANCE COMPARISON:  None Available. MEDICATIONS: None. ANESTHESIA/SEDATION: Moderate (conscious) sedation was employed during this procedure. A total of Versed 1 mg and Fentanyl 50 mcg was administered intravenously. Moderate Sedation Time: 15 minutes. The patient's level of consciousness and vital signs were monitored continuously by radiology nursing throughout the procedure under my direct supervision. CONTRAST:  None FLUOROSCOPY TIME:  0.2 mGy COMPLICATIONS: None immediate. PROCEDURE: The procedure, risks, benefits, and alternatives were explained to the patient. Questions regarding the procedure were encouraged and answered. The patient understands and consents to the procedure. The right neck and chest were prepped with chlorhexidine in a sterile fashion, and a sterile drape was applied covering the operative field. Maximum barrier sterile technique with sterile gowns and gloves were used for the procedure. A timeout was performed prior to the initiation of the procedure. Ultrasound was used to examine the jugular vein which was compressible and free of internal echoes. A skin marker was used to demarcate the planned venotomy and port pocket incision sites. Local anesthesia was provided to these sites and the subcutaneous tunnel track with 1% lidocaine with 1:100,000 epinephrine. A small incision was created at the jugular access site and blunt dissection was performed of the subcutaneous tissues. Under ultrasound guidance, the jugular vein was accessed with a 21 ga micropuncture needle and an 0.018" wire was inserted to the superior vena cava. Real-time ultrasound  guidance was utilized for vascular access including the acquisition of a permanent ultrasound image documenting patency of the accessed vessel. A 5 Fr micopuncture set was then used, through which a 0.035" Rosen wire was passed under fluoroscopic guidance into the inferior vena cava. An 8 Fr dilator was then placed over the wire. A subcutaneous port pocket was then created along the upper chest wall utilizing a combination of sharp and blunt dissection. The pocket was irrigated with sterile saline, packed with gauze, and observed for hemorrhage. A single lumen plastic power injectable port was chosen for placement. The 8 Fr catheter was tunneled from the port pocket site to the venotomy incision.  The port was placed in the pocket. The external catheter was trimmed to appropriate length. The dilator was exchanged for an 8 Fr peel-away sheath under fluoroscopic guidance. The catheter was then placed through the sheath and the sheath was removed. Final catheter positioning was confirmed and documented with a fluoroscopic spot radiograph. The port was accessed with a Huber needle, aspirated, and flushed with heparinized saline. The deep dermal layer of the port pocket incision was closed with interrupted 3-0 Vicryl suture. Dermabond was then placed over the port pocket and neck incisions. The patient tolerated the procedure well without immediate post procedural complication. FINDINGS: After catheter placement, the tip lies within the superior cavoatrial junction. The catheter aspirates and flushes normally and is ready for immediate use. IMPRESSION: Successful placement of a power injectable Port-A-Cath via the right internal jugular vein. The catheter is ready for immediate use. Marliss Coots, MD [facility] Radiology Electronically Signed   By: Marliss Coots M.D.   On: 03/03/2023 15:51   IR THORACENTESIS ASP PLEURAL SPACE W/IMG GUIDE  Result Date:  03/01/2023 INDICATION: Shortness of breath, lung cancer EXAM: ULTRASOUND GUIDED  THORACENTESIS MEDICATIONS: None. COMPLICATIONS: None immediate. PROCEDURE: An ultrasound guided thoracentesis was thoroughly discussed with the patient and questions answered. The benefits, risks, alternatives and complications were also discussed. The patient understands and wishes to proceed with the procedure. Written consent was obtained. Ultrasound was performed to localize and mark an adequate pocket of fluid in the left chest. The area was then prepped and draped in the normal sterile fashion. 1% Lidocaine was used for local anesthesia. Under ultrasound guidance a 19 gauge Yueh catheter was introduced. Thoracentesis was performed. The catheter was removed and a dressing applied. FINDINGS: A total of approximately 2 L of dark serosanguineous fluid was removed. IMPRESSION: Successful ultrasound guided left thoracentesis yielding 2 L of dark serosanguineous pleural fluid. Electronically Signed   By: Olive Bass M.D.   On: 03/01/2023 15:39   DG Chest Port 1 View  Result Date: 03/01/2023 CLINICAL DATA:  status post thoracentesis EXAM: PORTABLE CHEST 1 VIEW COMPARISON:  None Available. FINDINGS: The heart appears enlarged. Persistent dense opacity in the left lower lung. No pneumothorax. Right lung remains relatively well aerated. No acute osseous abnormality. IMPRESSION: No pneumothorax following left thoracentesis. Persistent left lower lung opacity, likely combination of residual effusion and consolidation. Electronically Signed   By: Olive Bass M.D.   On: 03/01/2023 14:58   DG Chest Port 1 View  Result Date: 02/17/2023 CLINICAL DATA:  Status post left pleural nodule biopsy and status post left thoracentesis. EXAM: PORTABLE CHEST 1 VIEW COMPARISON:  01/10/2023. FINDINGS: Negative for pneumothorax following the left thoracentesis and left pleural nodule biopsy. Again noted is a left perihilar mass. Opacities at the  left lung base could represent residual pleural fluid and known pleural disease. Trachea is midline. Heart size is upper limits of normal but stable. IMPRESSION: 1. Negative for pneumothorax following left thoracentesis and left pleural nodule biopsy. 2. Left perihilar mass. 3. Left basilar opacities could represent residual pleural fluid. Electronically Signed   By: Richarda Overlie M.D.   On: 02/17/2023 14:25   CT LUNG MASS BIOPSY  Result Date: 02/17/2023 INDICATION: 82 year old with history of lung cancer. Evidence for disease progression or recurrence. Patient needs a tissue diagnosis. EXAM: 1. CT-guided biopsy of left pleural nodule 2. CT-guided left thoracentesis TECHNIQUE: Multidetector CT imaging of the chest was performed following the standard protocol without IV contrast. RADIATION DOSE  REDUCTION: This exam was performed according to the departmental dose-optimization program which includes automated exposure control, adjustment of the mA and/or kV according to patient size and/or use of iterative reconstruction technique. MEDICATIONS: Moderate sedation ANESTHESIA/SEDATION: Moderate (conscious) sedation was employed during this procedure. A total of Versed 0.5 mg and Fentanyl 25 mcg was administered intravenously by the radiology nurse. Total intra-service moderate Sedation Time: 14 minutes. The patient's level of consciousness and vital signs were monitored continuously by radiology nursing throughout the procedure under my direct supervision. COMPLICATIONS: None immediate. PROCEDURE: Informed written consent was obtained from the patient after a thorough discussion of the procedural risks, benefits and alternatives. All questions were addressed. A timeout was performed prior to the initiation of the procedure. Patient was placed on his right side. CT images through the chest were obtained. Pleural nodularity in the posterior left lower chest was targeted for biopsy. In addition, the left side of the  chest was evaluated with ultrasound and demonstrated that the pleural fluid was relatively simple. The left side of the back was prepped with chlorhexidine and sterile field was created. Skin was anesthetized using 1% lidocaine. Small incision was made. Using CT guidance, 17 gauge coaxial needle was directed into a posterior left pleural nodule. Multiple core biopsies were obtained with an 18 gauge device. Specimens placed in formalin. Attention was directed to performing at thoracentesis. Using CT guidance, a Yueh catheter was directed into the pleural fluid along the posterior lower aspect of the left chest. Dark red fluid was aspirated. Approximately 1 L of fluid was removed. Follow up CT images were obtained. Yueh catheter was removed. Bandages were placed at both puncture sites. FINDINGS: CT images demonstrate a large left perihilar mass and extensive left pleural nodularity. There is also a nodule in the right upper lobe. Biopsy needle confirmed within a posterior left pleural nodule. 1 L of dark red fluid was removed from the left pleural space. IMPRESSION: CT-guided biopsy of a left pleural nodule and CT-guided left thoracentesis. Electronically Signed   By: Richarda Overlie M.D.   On: 02/17/2023 14:23   CT GUIDED NEEDLE PLACEMENT  Result Date: 02/17/2023 INDICATION: 82 year old with history of lung cancer. Evidence for disease progression or recurrence. Patient needs a tissue diagnosis. EXAM: 1. CT-guided biopsy of left pleural nodule 2. CT-guided left thoracentesis TECHNIQUE: Multidetector CT imaging of the chest was performed following the standard protocol without IV contrast. RADIATION DOSE REDUCTION: This exam was performed according to the departmental dose-optimization program which includes automated exposure control, adjustment of the mA and/or kV according to patient size and/or use of iterative reconstruction technique. MEDICATIONS: Moderate sedation ANESTHESIA/SEDATION: Moderate (conscious)  sedation was employed during this procedure. A total of Versed 0.5 mg and Fentanyl 25 mcg was administered intravenously by the radiology nurse. Total intra-service moderate Sedation Time: 14 minutes. The patient's level of consciousness and vital signs were monitored continuously by radiology nursing throughout the procedure under my direct supervision. COMPLICATIONS: None immediate. PROCEDURE: Informed written consent was obtained from the patient after a thorough discussion of the procedural risks, benefits and alternatives. All questions were addressed. A timeout was performed prior to the initiation of the procedure. Patient was placed on his right side. CT images through the chest were obtained. Pleural nodularity in the posterior left lower chest was targeted for biopsy. In addition, the left side of the chest was evaluated with ultrasound and demonstrated that the pleural fluid was relatively simple. The left side of the back was prepped with  chlorhexidine and sterile field was created. Skin was anesthetized using 1% lidocaine. Small incision was made. Using CT guidance, 17 gauge coaxial needle was directed into a posterior left pleural nodule. Multiple core biopsies were obtained with an 18 gauge device. Specimens placed in formalin. Attention was directed to performing at thoracentesis. Using CT guidance, a Yueh catheter was directed into the pleural fluid along the posterior lower aspect of the left chest. Dark red fluid was aspirated. Approximately 1 L of fluid was removed. Follow up CT images were obtained. Yueh catheter was removed. Bandages were placed at both puncture sites. FINDINGS: CT images demonstrate a large left perihilar mass and extensive left pleural nodularity. There is also a nodule in the right upper lobe. Biopsy needle confirmed within a posterior left pleural nodule. 1 L of dark red fluid was removed from the left pleural space. IMPRESSION: CT-guided biopsy of a left pleural nodule and  CT-guided left thoracentesis. Electronically Signed   By: Richarda Overlie M.D.   On: 02/17/2023 14:23   NM PET Image Restag (PS) Skull Base To Thigh  Result Date: 02/14/2023 CLINICAL DATA:  Subsequent treatment strategy for squamous cell carcinoma of the left lung with progressive metastatic disease on recent CT. EXAM: NUCLEAR MEDICINE PET SKULL BASE TO THIGH TECHNIQUE: 13.19 mCi F-18 FDG was injected intravenously. Full-ring PET imaging was performed from the skull base to thigh after the radiotracer. CT data was obtained and used for attenuation correction and anatomic localization. Fasting blood glucose: 122 mg/dl COMPARISON:  Chest CT 16/01/9603 and 01/31/2022.  PET-CT 04/15/2019. FINDINGS: Mediastinal blood pool activity: SUV max 3.0 NECK: No hypermetabolic cervical lymph nodes are identified. No suspicious activity identified within the pharyngeal mucosal space. Incidental CT findings: Bilateral carotid atherosclerosis. CHEST: As seen on recent CT, there are multiple enlarged mediastinal and left hilar lymph nodes which are hypermetabolic on PET-CT. The largest lymph node is in the left infrahilar region measuring 3.8 x 3.3 cm on image 64/6 (SUV max 10.8). The large left hilar mass is intensely hypermetabolic. This measures approximately 7.8 x 5.9 cm on image 53/6 and has an SUV max of 10.3. 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 Persistent moderate-sized left pleural effusion with subtotal left lung collapse. The cavitary right upper lobe nodule seen on recent CT is also hypermetabolic. This nodule measures 1.6 x 1.5 cm on image 40/6 and has an SUV max of 12.5. No other suspicious nodularity or hypermetabolic activity identified in the right hemithorax. Incidental CT findings: Centrilobular emphysema. Atherosclerosis of the aorta, great vessels and coronary arteries. ABDOMEN/PELVIS: There is  no hypermetabolic activity within the liver, adrenal glands, spleen or pancreas. There is no hypermetabolic nodal activity in the abdomen or pelvis. Bowel activity is within physiologic limits. Pleural based tumor in the left hemithorax may involve the left hemithorax, especially posteriorly. Incidental CT findings: Stable right renal cyst. Mild aortic and branch vessel atherosclerosis. There are diverticular changes throughout the sigmoid colon without evidence of acute inflammation. SKELETON: There is no hypermetabolic activity to suggest osseous metastatic disease. Incidental CT findings: Multilevel spondylosis. IMPRESSION: 1. 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 Multiple hypermetabolic mediastinal nodal metastases. 2. Hypermetabolic cavitary right upper lobe pulmonary nodule, also suspicious for metastatic disease versus metachronous primary lung cancer. 3. No evidence of metastatic disease  in the abdomen or pelvis. 4. Coronary artery atherosclerosis. Aortic Atherosclerosis (ICD10-I70.0) and Emphysema (ICD10-J43.9). Electronically Signed   By: Carey Bullocks M.D.   On: 02/14/2023 13:30   MR Brain W Wo Contrast  Result Date: 02/14/2023 CLINICAL DATA:  82 year old male with lung cancer. Progression on recent Chest CT. Staging. EXAM: MRI HEAD WITHOUT AND WITH CONTRAST TECHNIQUE: Multiplanar, multiecho pulse sequences of the brain and surrounding structures were obtained without and with intravenous contrast. CONTRAST:  10mL GADAVIST GADOBUTROL 1 MMOL/ML IV SOLN COMPARISON:  Chest CT 02/03/2023.  PET-CT 04/15/2019. FINDINGS: Brain: Cerebral volume is within normal limits for age. No midline shift, mass effect, or evidence of intracranial mass lesion. No abnormal enhancement identified. No dural thickening identified. No  restricted diffusion to suggest acute infarction. No ventriculomegaly, extra-axial collection or acute intracranial hemorrhage. Cervicomedullary junction and pituitary are within normal limits. Mild for age patchy bilateral white matter T2 and FLAIR hyperintensity. No cortical encephalomalacia identified. No chronic cerebral blood products on SWI. Deep gray nuclei and brainstem appear normal for age. There is a tiny chronic lacunar infarct in the posterior right cerebellum on series 8, image 8. Vascular: Major intracranial vascular flow voids are preserved. Following contrast the major dural venous sinuses are enhancing and appear to be patent. Skull and upper cervical spine: Negative visible cervical spine, spinal cord. Visualized bone marrow signal is within normal limits. Sinuses/Orbits: Negative. Other: Mastoids are well aerated. Visible internal auditory structures appear normal. Negative visible scalp and face. IMPRESSION: 1.  No metastatic disease or acute intracranial abnormality. 2. 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 Electronically Signed   By: Odessa Fleming M.D.   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  PET scan results from February 14, 2023 reviewed independently and reported as above confirming stage of disease although patient does not have metastatic disease outside his chest.  MRI of the brain on February 14, 2023 was negative for metastatic disease.  Patient wishes to pursue chemotherapy, therefore will receive carboplatinum, etoposide, and Tecentriq on day 1 with etoposide only on day 2 and 3.  Patient will require Fulphilia support.  Plan to do 4 cycles every 3 weeks and then reimage with PET scan.  If improvement of disease burden, will transition to Tecentriq maintenance every 3 weeks.  Patient has had port placement.  Proceed with  cycle 1, day 1 of treatment today.  Return to clinic in 1 week for laboratory work and further evaluation and then in 3 weeks for further evaluation and consideration of cycle 2.   Clinical stage IIB squamous cell carcinoma, left upper lobe lung:  Patient underwent biopsy with navigational bronchoscopy on October 19, 2018 confirming diagnosis.  Patient declined surgery and wished to pursue XRT along with concurrent chemotherapy.  Given his stage of disease he did not receive maintenance immunotherapy.  He completed cycle 7 of weekly carboplatinum and Taxol on January 09, 2019 and then completed XRT on January 14, 2019.   History of melanoma: Unclear stage or depth, although by report was in situ.  Patient had Mohs surgery in fall 2019.  Previous lung biopsy consistent with squamous cell carcinoma. Anxiety/depression: Continue current medications as prescribed.  Continue follow-up with primary care physician as well as psychiatry at the [place]. Renal insufficiency: Creatinine is trended up slightly to 1.67,  monitor.   Shortness of breath/cough: Patient recently underwent thoracentesis removing 2 L of fluid.  He has been instructed to call clinic if his shortness of breath gets worse for further imaging and consideration of repeat thoracentesis.  Continue oxygen as prescribed. Pain: Continue oxycodone as needed.  Patient expressed understanding and was in agreement with this plan. He also understands that He can call clinic at any time with any questions, concerns, or complaints.    Cancer Staging  Small cell lung cancer, left [facility]) Staging form: Lung, AJCC 8th Edition - Clinical stage from 02/24/2023: Stage IVA (cT4, cN2, cM1a) - Signed by Jeralyn Ruths, MD on 02/24/2023  Squamous cell carcinoma lung, left (HCC) Staging form: Lung, AJCC 8th Edition - Clinical stage from 10/27/2018: Stage IIB (cT1c, cN1, cM0) - Signed by Jeralyn Ruths, MD on 11/16/2018   Jeralyn Ruths, MD   03/06/2023  12:30 PM

## 2023-03-06 NOTE — Progress Notes (Signed)
Has had pain at incision sites in his back. Wife is wondering about infection. Worried about having fluid built back up in lungs. Having SOB increase.

## 2023-03-07 ENCOUNTER — Encounter: Payer: Self-pay | Admitting: Oncology

## 2023-03-07 ENCOUNTER — Telehealth: Payer: Self-pay

## 2023-03-07 ENCOUNTER — Inpatient Hospital Stay: Payer: Medicare Other

## 2023-03-07 VITALS — BP 132/55 | HR 50 | Temp 97.9°F | Resp 16

## 2023-03-07 DIAGNOSIS — C3492 Malignant neoplasm of unspecified part of left bronchus or lung: Secondary | ICD-10-CM

## 2023-03-07 DIAGNOSIS — Z5111 Encounter for antineoplastic chemotherapy: Secondary | ICD-10-CM | POA: Diagnosis not present

## 2023-03-07 LAB — T4: T4, Total: 8.5 ug/dL (ref 4.5–12.0)

## 2023-03-07 MED ORDER — ETOPOSIDE CHEMO INJECTION 1 GM/50ML
100.0000 mg/m2 | Freq: Once | INTRAVENOUS | Status: AC
  Administered 2023-03-07: 250 mg via INTRAVENOUS
  Filled 2023-03-07: qty 12.5

## 2023-03-07 MED ORDER — SODIUM CHLORIDE 0.9 % IV SOLN
INTRAVENOUS | Status: DC
  Filled 2023-03-07: qty 250

## 2023-03-07 MED ORDER — DEXAMETHASONE SODIUM PHOSPHATE 10 MG/ML IJ SOLN
10.0000 mg | Freq: Once | INTRAMUSCULAR | Status: AC
  Administered 2023-03-07: 10 mg via INTRAVENOUS
  Filled 2023-03-07: qty 1

## 2023-03-07 NOTE — Patient Instructions (Signed)
[facility] - A DEPT OF [facility]  Discharge Instructions: Thank you for choosing [facility] to provide your oncology and hematology care.  If you have a lab appointment with the Cancer Center, please go directly to the Cancer Center and check in at the registration area.  Wear comfortable clothing and clothing appropriate for easy access to any Portacath or PICC line.   We strive to give you quality time with your provider. You may need to reschedule your appointment if you arrive late (15 or more minutes).  Arriving late affects you and other patients whose appointments are after yours.  Also, if you miss three or more appointments without notifying the office, you may be dismissed from the clinic at the provider's discretion.      For prescription refill requests, have your pharmacy contact our office and allow 72 hours for refills to be completed.    Today you received the following chemotherapy and/or immunotherapy agents Etoposide      To help prevent nausea and vomiting after your treatment, we encourage you to take your nausea medication as directed.  BELOW ARE SYMPTOMS THAT SHOULD BE REPORTED IMMEDIATELY: *FEVER GREATER THAN 100.4 F (38 C) OR HIGHER *CHILLS OR SWEATING *NAUSEA AND VOMITING THAT IS NOT CONTROLLED WITH YOUR NAUSEA MEDICATION *UNUSUAL SHORTNESS OF BREATH *UNUSUAL BRUISING OR BLEEDING *URINARY PROBLEMS (pain or burning when urinating, or frequent urination) *BOWEL PROBLEMS (unusual diarrhea, constipation, pain near the anus) TENDERNESS IN MOUTH AND THROAT WITH OR WITHOUT PRESENCE OF ULCERS (sore throat, sores in mouth, or a toothache) UNUSUAL RASH, SWELLING OR PAIN  UNUSUAL VAGINAL DISCHARGE OR ITCHING   Items with * indicate a potential emergency and should be followed up as soon as possible or go to the Emergency Department if any problems should occur.  Please show the CHEMOTHERAPY ALERT CARD or IMMUNOTHERAPY  ALERT CARD at check-in to the Emergency Department and triage nurse.  Should you have questions after your visit or need to cancel or reschedule your appointment, please contact [facility] - A DEPT OF [facility]  [phone] and follow the prompts.  Office hours are 8:00 a.m. to 4:30 p.m. Monday - Friday. Please note that voicemails left after 4:00 p.m. may not be returned until the following business day.  We are closed weekends and major holidays. You have access to a nurse at all times for urgent questions. Please call the main number to the clinic [phone] and follow the prompts.  For any non-urgent questions, you may also contact your provider using MyChart. We now offer e-Visits for anyone 86 and older to request care online for non-urgent symptoms. For details visit [email or web address].   Also download the MyChart app! Go to the app store, search "MyChart", open the app, select [place], and log in with your MyChart username and password.

## 2023-03-07 NOTE — Telephone Encounter (Signed)
Telephone call to patient for follow up after receiving first infusion.   Patient wife states infusion went great.  States eating good and drinking plenty of fluids.   Denies any nausea or vomiting.  Encouraged patient to call for any concerns or questions.  

## 2023-03-08 ENCOUNTER — Ambulatory Visit
Admission: RE | Admit: 2023-03-08 | Discharge: 2023-03-08 | Disposition: A | Discharge status: Home / Self Care | Payer: Medicare Other | Source: Ambulatory Visit | Attending: Hospice and Palliative Medicine | Admitting: Hospice and Palliative Medicine

## 2023-03-08 ENCOUNTER — Other Ambulatory Visit: Payer: Self-pay

## 2023-03-08 ENCOUNTER — Ambulatory Visit
Admission: RE | Admit: 2023-03-08 | Discharge: 2023-03-08 | Disposition: A | Discharge status: Home / Self Care | Payer: Medicare Other | Source: Ambulatory Visit | Attending: Hospice and Palliative Medicine

## 2023-03-08 ENCOUNTER — Inpatient Hospital Stay: Payer: Medicare Other

## 2023-03-08 VITALS — BP 141/68 | HR 105 | Temp 98.4°F

## 2023-03-08 DIAGNOSIS — R0602 Shortness of breath: Secondary | ICD-10-CM | POA: Insufficient documentation

## 2023-03-08 DIAGNOSIS — C3492 Malignant neoplasm of unspecified part of left bronchus or lung: Secondary | ICD-10-CM

## 2023-03-08 DIAGNOSIS — Z5111 Encounter for antineoplastic chemotherapy: Secondary | ICD-10-CM | POA: Diagnosis not present

## 2023-03-08 MED ORDER — SODIUM CHLORIDE 0.9% FLUSH
10.0000 mL | Freq: Once | INTRAVENOUS | Status: AC
  Administered 2023-03-08: 10 mL via INTRAVENOUS
  Filled 2023-03-08: qty 10

## 2023-03-08 MED ORDER — SODIUM CHLORIDE 0.9 % IV SOLN
INTRAVENOUS | Status: DC
  Filled 2023-03-08: qty 250

## 2023-03-08 MED ORDER — HEPARIN SOD (PORK) LOCK FLUSH 100 UNIT/ML IV SOLN
500.0000 [IU] | Freq: Once | INTRAVENOUS | Status: AC | PRN
  Administered 2023-03-08: 500 [IU]
  Filled 2023-03-08: qty 5

## 2023-03-08 MED ORDER — SODIUM CHLORIDE 0.9 % IV SOLN
100.0000 mg/m2 | Freq: Once | INTRAVENOUS | Status: AC
  Administered 2023-03-08: 250 mg via INTRAVENOUS
  Filled 2023-03-08: qty 12.5

## 2023-03-08 MED ORDER — DEXAMETHASONE SODIUM PHOSPHATE 10 MG/ML IJ SOLN
10.0000 mg | Freq: Once | INTRAMUSCULAR | Status: AC
  Administered 2023-03-08: 10 mg via INTRAVENOUS
  Filled 2023-03-08: qty 1

## 2023-03-08 NOTE — Patient Instructions (Signed)
[facility]  Discharge Instructions: Thank you for choosing [facility] to provide your oncology and hematology care.  If you have a lab appointment with the Cancer Center, please go directly to the Cancer Center and check in at the registration area.  Wear comfortable clothing and clothing appropriate for easy access to any Portacath or PICC line.   We strive to give you quality time with your provider. You may need to reschedule your appointment if you arrive late (15 or more minutes).  Arriving late affects you and other patients whose appointments are after yours.  Also, if you miss three or more appointments without notifying the office, you may be dismissed from the clinic at the provider's discretion.      For prescription refill requests, have your pharmacy contact our office and allow 72 hours for refills to be completed.    Today you received the following chemotherapy and/or immunotherapy agents: etoposide    To help prevent nausea and vomiting after your treatment, we encourage you to take your nausea medication as directed.  BELOW ARE SYMPTOMS THAT SHOULD BE REPORTED IMMEDIATELY: *FEVER GREATER THAN 100.4 F (38 C) OR HIGHER *CHILLS OR SWEATING *NAUSEA AND VOMITING THAT IS NOT CONTROLLED WITH YOUR NAUSEA MEDICATION *UNUSUAL SHORTNESS OF BREATH *UNUSUAL BRUISING OR BLEEDING *URINARY PROBLEMS (pain or burning when urinating, or frequent urination) *BOWEL PROBLEMS (unusual diarrhea, constipation, pain near the anus) TENDERNESS IN MOUTH AND THROAT WITH OR WITHOUT PRESENCE OF ULCERS (sore throat, sores in mouth, or a toothache) UNUSUAL RASH, SWELLING OR PAIN  UNUSUAL VAGINAL DISCHARGE OR ITCHING   Items with * indicate a potential emergency and should be followed up as soon as possible or go to the Emergency Department if any problems should occur.  Please show the CHEMOTHERAPY ALERT CARD or IMMUNOTHERAPY  ALERT CARD at check-in to the Emergency Department and triage nurse.  Should you have questions after your visit or need to cancel or reschedule your appointment, please contact [facility] - A DEPT OF [facility]  [phone] and follow the prompts.  Office hours are 8:00 a.m. to 4:30 p.m. Monday - Friday. Please note that voicemails left after 4:00 p.m. may not be returned until the following business day.  We are closed weekends and major holidays. You have access to a nurse at all times for urgent questions. Please call the main number to the clinic [phone] and follow the prompts.  For any non-urgent questions, you may also contact your provider using MyChart. We now offer e-Visits for anyone 58 and older to request care online for non-urgent symptoms. For details visit [email or web address].   Also download the MyChart app! Go to the app store, search "MyChart", open the app, select , and log in with your MyChart username and password.

## 2023-03-09 ENCOUNTER — Ambulatory Visit
Admission: RE | Admit: 2023-03-09 | Discharge: 2023-03-09 | Disposition: A | Discharge status: Home / Self Care | Payer: Medicare Other | Source: Ambulatory Visit | Attending: Oncology | Admitting: Oncology

## 2023-03-09 ENCOUNTER — Other Ambulatory Visit: Payer: Self-pay | Admitting: Student

## 2023-03-09 ENCOUNTER — Ambulatory Visit
Admission: RE | Admit: 2023-03-09 | Discharge: 2023-03-09 | Disposition: A | Discharge status: Home / Self Care | Payer: Medicare Other | Source: Ambulatory Visit | Attending: Student | Admitting: Student

## 2023-03-09 ENCOUNTER — Other Ambulatory Visit: Payer: Self-pay | Admitting: *Deleted

## 2023-03-09 ENCOUNTER — Encounter: Payer: Self-pay | Admitting: Oncology

## 2023-03-09 DIAGNOSIS — R0602 Shortness of breath: Secondary | ICD-10-CM | POA: Diagnosis present

## 2023-03-09 DIAGNOSIS — C3492 Malignant neoplasm of unspecified part of left bronchus or lung: Secondary | ICD-10-CM

## 2023-03-09 DIAGNOSIS — J90 Pleural effusion, not elsewhere classified: Secondary | ICD-10-CM

## 2023-03-09 MED ORDER — LIDOCAINE HCL (PF) 1 % IJ SOLN
10.0000 mL | Freq: Once | INTRAMUSCULAR | Status: AC
  Administered 2023-03-09: 10 mL via INTRADERMAL
  Filled 2023-03-09: qty 10

## 2023-03-09 NOTE — Procedures (Signed)
PROCEDURE SUMMARY:  Successful US guided left thoracentesis. Yielded 1.4 L of amber-colored fluid. Pt tolerated procedure well. No immediate complications.  Specimen not sent for labs. CXR ordered; no post-procedure pneumothorax identified.   EBL < 5 mL  Mickie Kay, NP 03/09/2023 3:47 PM

## 2023-03-10 ENCOUNTER — Inpatient Hospital Stay: Payer: Medicare Other

## 2023-03-10 DIAGNOSIS — Z5111 Encounter for antineoplastic chemotherapy: Secondary | ICD-10-CM | POA: Diagnosis not present

## 2023-03-10 DIAGNOSIS — C3492 Malignant neoplasm of unspecified part of left bronchus or lung: Secondary | ICD-10-CM

## 2023-03-10 MED ORDER — PEGFILGRASTIM INJECTION 6 MG/0.6ML ~~LOC~~
6.0000 mg | PREFILLED_SYRINGE | Freq: Once | SUBCUTANEOUS | Status: AC
Start: 2023-03-10 — End: 2023-03-10
  Administered 2023-03-10: 6 mg via SUBCUTANEOUS
  Filled 2023-03-10: qty 0.6

## 2023-03-13 ENCOUNTER — Encounter: Payer: Self-pay | Admitting: Oncology

## 2023-03-13 ENCOUNTER — Inpatient Hospital Stay: Payer: Medicare Other

## 2023-03-13 ENCOUNTER — Inpatient Hospital Stay (HOSPITAL_BASED_OUTPATIENT_CLINIC_OR_DEPARTMENT_OTHER): Payer: Medicare Other | Admitting: Oncology

## 2023-03-13 VITALS — BP 108/66 | HR 91 | Temp 101.5°F | Resp 20 | Ht 72.0 in

## 2023-03-13 DIAGNOSIS — C3492 Malignant neoplasm of unspecified part of left bronchus or lung: Secondary | ICD-10-CM

## 2023-03-13 DIAGNOSIS — Z5111 Encounter for antineoplastic chemotherapy: Secondary | ICD-10-CM | POA: Diagnosis not present

## 2023-03-13 LAB — CMP (CANCER CENTER ONLY)
ALT: 15 U/L (ref 0–44)
AST: 20 U/L (ref 15–41)
Albumin: 3.1 g/dL — ABNORMAL LOW (ref 3.5–5.0)
Alkaline Phosphatase: 66 U/L (ref 38–126)
Anion gap: 9 (ref 5–15)
BUN: 31 mg/dL — ABNORMAL HIGH (ref 8–23)
CO2: 25 mmol/L (ref 22–32)
Calcium: 8.2 mg/dL — ABNORMAL LOW (ref 8.9–10.3)
Chloride: 99 mmol/L (ref 98–111)
Creatinine: 1.47 mg/dL — ABNORMAL HIGH (ref 0.61–1.24)
GFR, Estimated: 48 mL/min — ABNORMAL LOW (ref >60)
Glucose, Bld: 102 mg/dL — ABNORMAL HIGH (ref 70–99)
Potassium: 4.8 mmol/L (ref 3.5–5.1)
Sodium: 133 mmol/L — ABNORMAL LOW (ref 135–145)
Total Bilirubin: 1.4 mg/dL — ABNORMAL HIGH (ref <1.2)
Total Protein: 5.8 g/dL — ABNORMAL LOW (ref 6.5–8.1)

## 2023-03-13 LAB — CBC WITH DIFFERENTIAL (CANCER CENTER ONLY)
Abs Immature Granulocytes: 0.1 10*3/uL — ABNORMAL HIGH (ref 0.00–0.07)
Band Neutrophils: 1 %
Basophils Absolute: 0 10*3/uL (ref 0.0–0.1)
Basophils Relative: 0 %
Eosinophils Absolute: 0.1 10*3/uL (ref 0.0–0.5)
Eosinophils Relative: 1 %
HCT: 45.1 % (ref 39.0–52.0)
Hemoglobin: 14.8 g/dL (ref 13.0–17.0)
Lymphocytes Relative: 8 %
Lymphs Abs: 0.4 10*3/uL — ABNORMAL LOW (ref 0.7–4.0)
MCH: 30.7 pg (ref 26.0–34.0)
MCHC: 32.8 g/dL (ref 30.0–36.0)
MCV: 93.6 fL (ref 80.0–100.0)
Metamyelocytes Relative: 1 %
Monocytes Absolute: 0 10*3/uL — ABNORMAL LOW (ref 0.1–1.0)
Monocytes Relative: 0 %
Neutro Abs: 5 10*3/uL (ref 1.7–7.7)
Neutrophils Relative %: 89 %
Platelet Count: 76 10*3/uL — ABNORMAL LOW (ref 150–400)
RBC: 4.82 MIL/uL (ref 4.22–5.81)
RDW: 12.7 % (ref 11.5–15.5)
Smear Review: DECREASED
WBC Count: 5.5 10*3/uL (ref 4.0–10.5)
nRBC: 0 % (ref 0.0–0.2)

## 2023-03-13 MED ORDER — PAXLOVID (150/100) 10 X 150 MG & 10 X 100MG PO TBPK: 2.0000 | ORAL_TABLET | Freq: Two times a day (BID) | ORAL | 0 refills | Status: AC

## 2023-03-13 NOTE — Progress Notes (Unsigned)
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  INTERVAL HISTORY: Patient returns to clinic today for further evaluation and to assess his toleration of cycle 1 of carboplatin, etoposide, Tecentriq.  He tolerated his treatment well, but over the weekend he developed a cough, congestion, and fever.  He continues to have chronic shortness of breath.  He remains anxious.  He has no neurologic complaints.  He denies any chest pain, cough, or hemoptysis.  He has a good appetite and denies weight loss.  He denies any nausea, vomiting, constipation, or diarrhea.  He has no urinary complaints.  Patient offers no further specific complaints today.  REVIEW OF SYSTEMS:   Review of Systems  Constitutional:  Positive for fever and malaise/fatigue. Negative for weight loss.  HENT: Negative.  Negative for congestion.   Respiratory:  Positive for cough and shortness of breath. Negative for hemoptysis.   Cardiovascular: Negative.  Negative for chest pain and leg swelling.  Gastrointestinal: Negative.  Negative for abdominal pain and nausea.  Genitourinary: Negative.  Negative for dysuria.  Musculoskeletal: Negative.  Negative for back pain.  Skin:  Negative for rash.  Neurological: Negative.  Negative for dizziness, focal weakness, weakness and headaches.  Endo/Heme/Allergies:  Does not bruise/bleed easily.   Psychiatric/Behavioral:  Negative for depression. The patient is nervous/anxious.     As per HPI. Otherwise, a complete review of systems is negative.  PAST MEDICAL HISTORY: Past Medical History:  Diagnosis Date   Asthma    COPD (chronic obstructive pulmonary disease) (HCC)    Hearing loss    Hypertension    Hypothyroidism    Mass of left lung    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HCC)    Prostate enlargement    Shortness of breath    Squamous cell carcinoma of lung, left (HCC)    Thyroid disease    Tobacco abuse     PAST SURGICAL HISTORY: Past Surgical History:  Procedure Laterality Date   CARDIOVERSION N/A    CARDIOVERSION N/A 11/24/2021   Procedure: CARDIOVERSION;  Surgeon: Yvonne Kendall, MD;  Location: [facility] ORS;  Service: Cardiovascular;  Laterality: N/A;   ELECTROMAGNETIC NAVIGATION BROCHOSCOPY Left 10/19/2018   Procedure: ELECTROMAGNETIC NAVIGATION BRONCHOSCOPY LEFT;  Surgeon: Salena Saner, MD;  Location: [facility] ORS;  Service: Cardiopulmonary;  Laterality: Left;   ENDOBRONCHIAL ULTRASOUND Left 10/19/2018   Procedure: ENDOBRONCHIAL ULTRASOUND LEFT;  Surgeon: Salena Saner, MD;  Location: [facility] ORS;  Service: Cardiopulmonary;  Laterality: Left;   IR IMAGING GUIDED PORT INSERTION  03/03/2023   IR THORACENTESIS ASP PLEURAL SPACE W/IMG GUIDE  03/01/2023   MELANOMA EXCISION Left    NO PAST SURGERIES      FAMILY HISTORY: Family History  Problem Relation Age of Onset   Aneurysm Mother    Cancer Father     ADVANCED DIRECTIVES (Y/N):  N  HEALTH MAINTENANCE: Social History   Tobacco Use   Smoking status: Former    Current packs/day: 0.00    Average packs/day: 0.3 packs/day for  62.0 years (15.5 ttl pk-yrs)    Types: Cigarettes    Start date: 09/04/1956    Quit date: 09/05/2018    Years since quitting: 4.5   Smokeless tobacco: Never  Vaping Use   Vaping status: Never Used  Substance Use Topics   Alcohol use: No    Alcohol/week: 0.0 standard drinks of alcohol    Drug use: No     Colonoscopy:  PAP:  Bone density:  Lipid panel:  No Known Allergies  Current Outpatient Medications  Medication Sig Dispense Refill   albuterol (PROVENTIL HFA;VENTOLIN HFA) 108 (90 Base) MCG/ACT inhaler Inhale 2 puffs into the lungs every 6 (six) hours as needed for wheezing or shortness of breath. 1 Inhaler 0   albuterol (PROVENTIL) (2.5 MG/3ML) 0.083% nebulizer solution Take 2.5 mg by nebulization every 6 (six) hours as needed for wheezing or shortness of breath.     apixaban (ELIQUIS) 5 MG TABS tablet Take 2.5 mg by mouth 2 (two) times daily.     carboxymethylcellulose (REFRESH PLUS) 0.5 % SOLN 1 drop 4 (four) times daily as needed.     finasteride (PROSCAR) 5 MG tablet Take 5 mg by mouth daily.     fluticasone-salmeterol (ADVAIR) 250-50 MCG/ACT AEPB Inhale 1 puff into the lungs in the morning and at bedtime.     levothyroxine (SYNTHROID) 200 MCG tablet Take 200 mcg by mouth daily before breakfast.     lidocaine-prilocaine (EMLA) cream Apply to affected area once 30 g 3   nirmatrelvir/ritonavir, renal dosing, (PAXLOVID, 150/100,) 10 x 150 MG & 10 x 100MG  TBPK Take 2 tablets by mouth 2 (two) times daily for 5 days. Dosage for moderate renal impairment (eGFR >/= 30 to <60 mL/min): 150 mg nirmatrelvir (one 150 mg tablet) with 100 mg ritonavir (one 100 mg tablet), with both tablets taken together twice daily for 5 days. Not recommended if eGFR < 30 mL/min.  PAXLOVID is not recommend in patients with severe hepatic impairment (Child-Pugh Class C). 20 tablet 0   ondansetron (ZOFRAN) 8 MG tablet Take 1 tablet (8 mg total) by mouth every 8 (eight) hours as needed for nausea, vomiting or refractory nausea / vomiting. Start on the third day after carboplatin. 60 tablet 2   Oxycodone HCl 10 MG TABS Take 1 tablet (10 mg total) by mouth every 6 (six) hours as needed. Okay to cut tablet in half if needed. 60 tablet 0   prochlorperazine (COMPAZINE) 10 MG tablet Take 1 tablet (10 mg  total) by mouth every 6 (six) hours as needed for nausea or vomiting. 60 tablet 2   SEMAGLUTIDE,0.25 OR 0.5MG /DOS, [place] Inject 0.5 mg into the skin once a week.     tamsulosin (FLOMAX) 0.4 MG CAPS capsule 0.8 mg daily.     Tiotropium Bromide Monohydrate (SPIRIVA RESPIMAT) 2.5 MCG/ACT AERS Inhale 2 puffs into the lungs daily. 1 each 11   torsemide (DEMADEX) 20 MG tablet TAKE 1 TABLET BY MOUTH EVERY OTHER DAY 45 tablet 2   vitamin B-12 (CYANOCOBALAMIN) 1000 MCG tablet Take 1,000 mcg by mouth daily.     glimepiride (AMARYL) 2 MG tablet Take 2 mg by mouth daily with breakfast. (Patient not taking: Reported on 02/24/2023)     No current facility-administered medications for this visit.    OBJECTIVE: Vitals:   03/13/23 0918  BP: 108/66  Pulse: 91  Resp: 20  Temp: (!) 101.5 F (38.6 C)  SpO2: 90%      Body mass index is 36.35 kg/m.  ECOG FS:2 - Symptomatic, <50% confined to bed  General: Well-developed, well-nourished, no acute distress.  Sitting in a wheelchair. Eyes: Pink conjunctiva, anicteric sclera. HEENT: Normocephalic, moist mucous membranes. Lungs: No audible wheezing or coughing. Heart: Regular rate and rhythm. Abdomen: Soft, nontender, no obvious distention. Musculoskeletal: No edema, cyanosis, or clubbing. Neuro: Alert, answering all questions appropriately. Cranial nerves grossly intact. Skin: No rashes or petechiae noted. Psych: Normal affect.  LAB RESULTS:  Lab Results  Component Value Date   NA 133 (L) 03/13/2023   K 4.8 03/13/2023   CL 99 03/13/2023   CO2 25 03/13/2023   GLUCOSE 102 (H) 03/13/2023   BUN 31 (H) 03/13/2023   CREATININE 1.47 (H) 03/13/2023   CALCIUM 8.2 (L) 03/13/2023   PROT 5.8 (L) 03/13/2023   ALBUMIN 3.1 (L) 03/13/2023   AST 20 03/13/2023   ALT 15 03/13/2023   ALKPHOS 66 03/13/2023   BILITOT 1.4 (H) 03/13/2023   GFRNONAA 48 (L) 03/13/2023   GFRAA 58 (L) 07/18/2019    Lab Results  Component Value Date   WBC 5.5 03/13/2023    NEUTROABS 5.0 03/13/2023   HGB 14.8 03/13/2023   HCT 45.1 03/13/2023   MCV 93.6 03/13/2023   PLT 76 (L) 03/13/2023     STUDIES: US THORACENTESIS ASP PLEURAL SPACE W/IMG GUIDE  Result Date: 03/09/2023 INDICATION: Patient with a history of lung cancer with recurrent pleural effusions. Interventional radiology asked to perform a therapeutic thoracentesis. EXAM: ULTRASOUND GUIDED THORACENTESIS MEDICATIONS: 1% lidocaine 10 mL COMPLICATIONS: None immediate. PROCEDURE: An ultrasound guided thoracentesis was thoroughly discussed with the patient and questions answered. The benefits, risks, alternatives and complications were also discussed. The patient understands and wishes to proceed with the procedure. Written consent was obtained. Ultrasound was performed to localize and mark an adequate pocket of fluid in the LEFT chest. The area was then prepped and draped in the normal sterile fashion. 1% Lidocaine was used for local anesthesia. Under ultrasound guidance a 6 Fr Safe-T-Centesis catheter was introduced. Thoracentesis was performed. The catheter was removed and a dressing applied. FINDINGS: A total of approximately 1.4 L of amber colored fluid was removed. IMPRESSION: Successful ultrasound guided LEFT thoracentesis yielding 1.4 L of pleural fluid. Procedure performed by Alwyn Ren NP Electronically Signed   By: Roanna Banning M.D.   On: 03/09/2023 16:26   DG Chest Port 1 View  Result Date: 03/09/2023 CLINICAL DATA:  post thoracentesis.  LEFT. EXAM: PORTABLE CHEST 1 VIEW COMPARISON:  IR thoracentesis, earlier same day. FINDINGS: Support lines: RIGHT chest port with catheter tip at the superior cavoatrial junction. The cardiac apex is obscured. Perihilar interstitial thickening. The RIGHT chest relatively clear. Mildly improved aeration of the LEFT chest, with no pneumothorax in small-to-moderate residual pleural effusion. LEFT basilar consolidation, unchanged. No interval osseous abnormality.  IMPRESSION: 1. Mildly improved aeration of the LEFT chest with small-to-moderate volume residual pleural effusion. No pneumothorax 2. LEFT basilar consolidation, unchanged and likely to represent atelectasis though superimposed pneumonia can appear similar. Electronically Signed   By: Roanna Banning M.D.   On: 03/09/2023 16:01   DG Chest 2 View  Result Date: 03/09/2023 CLINICAL DATA:  Shortness of breath. EXAM: CHEST - 2 VIEW COMPARISON:  March 01, 2023. FINDINGS: Stable cardiomegaly with central pulmonary vascular congestion and probable bilateral pulmonary edema. Moderate size left pleural effusion is noted with associated atelectasis. Right internal jugular Port-A-Cath is noted with distal tip in expected position of the SVC. IMPRESSION: Stable cardiomegaly with central pulmonary vascular congestion and  probable bilateral pulmonary edema. Moderate size left pleural effusion with associated atelectasis. Electronically Signed   By: Lupita Raider M.D.   On: 03/09/2023 11:55   IR IMAGING GUIDED PORT INSERTION  Result Date: 03/03/2023 INDICATION: 82 year old male with history of advanced stage lung cancer requiring central venous access for chemotherapy. EXAM: IMPLANTED PORT A CATH PLACEMENT WITH ULTRASOUND AND FLUOROSCOPIC GUIDANCE COMPARISON:  None Available. MEDICATIONS: None. ANESTHESIA/SEDATION: Moderate (conscious) sedation was employed during this procedure. A total of Versed 1 mg and Fentanyl 50 mcg was administered intravenously. Moderate Sedation Time: 15 minutes. The patient's level of consciousness and vital signs were monitored continuously by radiology nursing throughout the procedure under my direct supervision. CONTRAST:  None FLUOROSCOPY TIME:  0.2 mGy COMPLICATIONS: None immediate. PROCEDURE: The procedure, risks, benefits, and alternatives were explained to the patient. Questions regarding the procedure were encouraged and answered. The patient understands and consents to the procedure.  The right neck and chest were prepped with chlorhexidine in a sterile fashion, and a sterile drape was applied covering the operative field. Maximum barrier sterile technique with sterile gowns and gloves were used for the procedure. A timeout was performed prior to the initiation of the procedure. Ultrasound was used to examine the jugular vein which was compressible and free of internal echoes. A skin marker was used to demarcate the planned venotomy and port pocket incision sites. Local anesthesia was provided to these sites and the subcutaneous tunnel track with 1% lidocaine with 1:100,000 epinephrine. A small incision was created at the jugular access site and blunt dissection was performed of the subcutaneous tissues. Under ultrasound guidance, the jugular vein was accessed with a 21 ga micropuncture needle and an 0.018" wire was inserted to the superior vena cava. Real-time ultrasound guidance was utilized for vascular access including the acquisition of a permanent ultrasound image documenting patency of the accessed vessel. A 5 Fr micopuncture set was then used, through which a 0.035" Rosen wire was passed under fluoroscopic guidance into the inferior vena cava. An 8 Fr dilator was then placed over the wire. A subcutaneous port pocket was then created along the upper chest wall utilizing a combination of sharp and blunt dissection. The pocket was irrigated with sterile saline, packed with gauze, and observed for hemorrhage. A single lumen plastic power injectable port was chosen for placement. The 8 Fr catheter was tunneled from the port pocket site to the venotomy incision. The port was placed in the pocket. The external catheter was trimmed to appropriate length. The dilator was exchanged for an 8 Fr peel-away sheath under fluoroscopic guidance. The catheter was then placed through the sheath and the sheath was removed. Final catheter positioning was confirmed and documented with a fluoroscopic spot  radiograph. The port was accessed with a Huber needle, aspirated, and flushed with heparinized saline. The deep dermal layer of the port pocket incision was closed with interrupted 3-0 Vicryl suture. Dermabond was then placed over the port pocket and neck incisions. The patient tolerated the procedure well without immediate post procedural complication. FINDINGS: After catheter placement, the tip lies within the superior cavoatrial junction. The catheter aspirates and flushes normally and is ready for immediate use. IMPRESSION: Successful placement of a power injectable Port-A-Cath via the right internal jugular vein. The catheter is ready for immediate use. Marliss Coots, MD [facility] Electronically Signed   By: Marliss Coots M.D.   On: 03/03/2023 15:51   IR THORACENTESIS ASP PLEURAL SPACE W/IMG GUIDE  Result Date: 03/01/2023 INDICATION: Shortness of breath, lung cancer EXAM: ULTRASOUND GUIDED  THORACENTESIS MEDICATIONS: None. COMPLICATIONS: None immediate. PROCEDURE: An ultrasound guided thoracentesis was thoroughly discussed with the patient and questions answered. The benefits, risks, alternatives and complications were also discussed. The patient understands and wishes to proceed with the procedure. Written consent was obtained. Ultrasound was performed to localize and mark an adequate pocket of fluid in the left chest. The area was then prepped and draped in the normal sterile fashion. 1% Lidocaine was used for local anesthesia. Under ultrasound guidance a 19 gauge Yueh catheter was introduced. Thoracentesis was performed. The catheter was removed and a dressing applied. FINDINGS: A total of approximately 2 L of dark serosanguineous fluid was removed. IMPRESSION: Successful ultrasound guided left thoracentesis yielding 2 L of dark serosanguineous pleural fluid. Electronically Signed   By: Olive Bass M.D.   On: 03/01/2023 15:39   DG Chest  Port 1 View  Result Date: 03/01/2023 CLINICAL DATA:  status post thoracentesis EXAM: PORTABLE CHEST 1 VIEW COMPARISON:  None Available. FINDINGS: The heart appears enlarged. Persistent dense opacity in the left lower lung. No pneumothorax. Right lung remains relatively well aerated. No acute osseous abnormality. IMPRESSION: No pneumothorax following left thoracentesis. Persistent left lower lung opacity, likely combination of residual effusion and consolidation. Electronically Signed   By: Olive Bass M.D.   On: 03/01/2023 14:58   DG Chest Port 1 View  Result Date: 02/17/2023 CLINICAL DATA:  Status post left pleural nodule biopsy and status post left thoracentesis. EXAM: PORTABLE CHEST 1 VIEW COMPARISON:  01/10/2023. FINDINGS: Negative for pneumothorax following the left thoracentesis and left pleural nodule biopsy. Again noted is a left perihilar mass. Opacities at the left lung base could represent residual pleural fluid and known pleural disease. Trachea is midline. Heart size is upper limits of normal but stable. IMPRESSION: 1. Negative for pneumothorax following left thoracentesis and left pleural nodule biopsy. 2. Left perihilar mass. 3. Left basilar opacities could represent residual pleural fluid. Electronically Signed   By: Richarda Overlie M.D.   On: 02/17/2023 14:25   CT LUNG MASS BIOPSY  Result Date: 02/17/2023 INDICATION: 82 year old with history of lung cancer. Evidence for disease progression or recurrence. Patient needs a tissue diagnosis. EXAM: 1. CT-guided biopsy of left pleural nodule 2. CT-guided left thoracentesis TECHNIQUE: Multidetector CT imaging of the chest was performed following the standard protocol without IV contrast. RADIATION DOSE REDUCTION: This exam was performed according to the departmental dose-optimization program which includes automated exposure control, adjustment of the mA and/or kV according to patient size and/or use of iterative reconstruction technique.  MEDICATIONS: Moderate sedation ANESTHESIA/SEDATION: Moderate (conscious) sedation was employed during this procedure. A total of Versed 0.5 mg and Fentanyl 25 mcg was administered intravenously by the radiology nurse. Total intra-service moderate Sedation Time: 14 minutes. The patient's level of consciousness and vital signs were monitored continuously by radiology nursing throughout the procedure under my direct supervision. COMPLICATIONS: None immediate. PROCEDURE: Informed written consent was obtained from the patient after a thorough discussion of the procedural risks, benefits and alternatives. All questions were addressed. A timeout was performed prior to the initiation of the procedure. Patient was placed on his right side. CT images through the chest were obtained. Pleural nodularity in the posterior left lower chest was targeted for biopsy. In addition, the left side of the chest was evaluated with ultrasound and demonstrated that the pleural fluid was relatively simple. The left side of the back was prepped with chlorhexidine  and sterile field was created. Skin was anesthetized using 1% lidocaine. Small incision was made. Using CT guidance, 17 gauge coaxial needle was directed into a posterior left pleural nodule. Multiple core biopsies were obtained with an 18 gauge device. Specimens placed in formalin. Attention was directed to performing at thoracentesis. Using CT guidance, a Yueh catheter was directed into the pleural fluid along the posterior lower aspect of the left chest. Dark red fluid was aspirated. Approximately 1 L of fluid was removed. Follow up CT images were obtained. Yueh catheter was removed. Bandages were placed at both puncture sites. FINDINGS: CT images demonstrate a large left perihilar mass and extensive left pleural nodularity. There is also a nodule in the right upper lobe. Biopsy needle confirmed within a posterior left pleural nodule. 1 L of dark red fluid was removed from the left  pleural space. IMPRESSION: CT-guided biopsy of a left pleural nodule and CT-guided left thoracentesis. Electronically Signed   By: Richarda Overlie M.D.   On: 02/17/2023 14:23   CT GUIDED NEEDLE PLACEMENT  Result Date: 02/17/2023 INDICATION: 82 year old with history of lung cancer. Evidence for disease progression or recurrence. Patient needs a tissue diagnosis. EXAM: 1. CT-guided biopsy of left pleural nodule 2. CT-guided left thoracentesis TECHNIQUE: Multidetector CT imaging of the chest was performed following the standard protocol without IV contrast. RADIATION DOSE REDUCTION: This exam was performed according to the departmental dose-optimization program which includes automated exposure control, adjustment of the mA and/or kV according to patient size and/or use of iterative reconstruction technique. MEDICATIONS: Moderate sedation ANESTHESIA/SEDATION: Moderate (conscious) sedation was employed during this procedure. A total of Versed 0.5 mg and Fentanyl 25 mcg was administered intravenously by the radiology nurse. Total intra-service moderate Sedation Time: 14 minutes. The patient's level of consciousness and vital signs were monitored continuously by radiology nursing throughout the procedure under my direct supervision. COMPLICATIONS: None immediate. PROCEDURE: Informed written consent was obtained from the patient after a thorough discussion of the procedural risks, benefits and alternatives. All questions were addressed. A timeout was performed prior to the initiation of the procedure. Patient was placed on his right side. CT images through the chest were obtained. Pleural nodularity in the posterior left lower chest was targeted for biopsy. In addition, the left side of the chest was evaluated with ultrasound and demonstrated that the pleural fluid was relatively simple. The left side of the back was prepped with chlorhexidine and sterile field was created. Skin was anesthetized using 1% lidocaine. Small  incision was made. Using CT guidance, 17 gauge coaxial needle was directed into a posterior left pleural nodule. Multiple core biopsies were obtained with an 18 gauge device. Specimens placed in formalin. Attention was directed to performing at thoracentesis. Using CT guidance, a Yueh catheter was directed into the pleural fluid along the posterior lower aspect of the left chest. Dark red fluid was aspirated. Approximately 1 L of fluid was removed. Follow up CT images were obtained. Yueh catheter was removed. Bandages were placed at both puncture sites. FINDINGS: CT images demonstrate a large left perihilar mass and extensive left pleural nodularity. There is also a nodule in the right upper lobe. Biopsy needle confirmed within a posterior left pleural nodule. 1 L of dark red fluid was removed from the left pleural space. IMPRESSION: CT-guided biopsy of a left pleural nodule and CT-guided left thoracentesis. Electronically Signed   By: Richarda Overlie M.D.   On: 02/17/2023 14:23   NM PET Image Restag (PS) Skull Base To  Thigh  Result Date: 02/14/2023 CLINICAL DATA:  Subsequent treatment strategy for squamous cell carcinoma of the left lung with progressive metastatic disease on recent CT. EXAM: NUCLEAR MEDICINE PET SKULL BASE TO THIGH TECHNIQUE: 13.19 mCi F-18 FDG was injected intravenously. Full-ring PET imaging was performed from the skull base to thigh after the radiotracer. CT data was obtained and used for attenuation correction and anatomic localization. Fasting blood glucose: 122 mg/dl COMPARISON:  Chest CT 16/01/9603 and 01/31/2022.  PET-CT 04/15/2019. FINDINGS: Mediastinal blood pool activity: SUV max 3.0 NECK: No hypermetabolic cervical lymph nodes are identified. No suspicious activity identified within the pharyngeal mucosal space. Incidental CT findings: Bilateral carotid atherosclerosis. CHEST: As seen on recent CT, there are multiple enlarged mediastinal and left hilar lymph nodes which are  hypermetabolic on PET-CT. The largest lymph node is in the left infrahilar region measuring 3.8 x 3.3 cm on image 64/6 (SUV max 10.8). The large left hilar mass is intensely hypermetabolic. This measures approximately 7.8 x 5.9 cm on image 53/6 and has an SUV max of 10.3. 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 Persistent moderate-sized left pleural effusion with subtotal left lung collapse. The cavitary right upper lobe nodule seen on recent CT is also hypermetabolic. This nodule measures 1.6 x 1.5 cm on image 40/6 and has an SUV max of 12.5. No other suspicious nodularity or hypermetabolic activity identified in the right hemithorax. Incidental CT findings: Centrilobular emphysema. Atherosclerosis of the aorta, great vessels and coronary arteries. ABDOMEN/PELVIS: There is no hypermetabolic activity within the liver, adrenal glands, spleen or pancreas. There is no hypermetabolic nodal activity in the abdomen or pelvis. Bowel activity is within physiologic limits. Pleural based tumor in the left hemithorax may involve the left hemithorax, especially posteriorly. Incidental CT findings: Stable right renal cyst. Mild aortic and branch vessel atherosclerosis. There are diverticular changes throughout the sigmoid colon without evidence of acute inflammation. SKELETON: There is no hypermetabolic activity to suggest osseous metastatic disease. Incidental CT findings: Multilevel spondylosis. IMPRESSION: 1. 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 Multiple hypermetabolic mediastinal nodal metastases. 2. Hypermetabolic cavitary right upper lobe pulmonary nodule, also suspicious for metastatic disease versus metachronous primary  lung cancer. 3. No evidence of metastatic disease in the abdomen or pelvis. 4. Coronary artery atherosclerosis. Aortic Atherosclerosis (ICD10-I70.0) and Emphysema (ICD10-J43.9). Electronically Signed   By: Carey Bullocks M.D.   On: 02/14/2023 13:30   MR Brain W Wo Contrast  Result Date: 02/14/2023 CLINICAL DATA:  82 year old male with lung cancer. Progression on recent Chest CT. Staging. EXAM: MRI HEAD WITHOUT AND WITH CONTRAST TECHNIQUE: Multiplanar, multiecho pulse sequences of the brain and surrounding structures were obtained without and with intravenous contrast. CONTRAST:  10mL GADAVIST GADOBUTROL 1 MMOL/ML IV SOLN COMPARISON:  Chest CT 02/03/2023.  PET-CT 04/15/2019. FINDINGS: Brain: Cerebral volume is within normal limits for age. No midline shift, mass effect, or evidence of intracranial mass lesion. No abnormal enhancement identified. No dural thickening identified. No restricted diffusion to suggest acute infarction. No ventriculomegaly, extra-axial collection or acute intracranial hemorrhage. Cervicomedullary junction and pituitary are within normal limits. Mild for age patchy bilateral white matter T2 and FLAIR hyperintensity. No cortical encephalomalacia identified. No chronic cerebral blood products on SWI. Deep gray nuclei and brainstem appear normal for age. There is a tiny chronic  lacunar infarct in the posterior right cerebellum on series 8, image 8. Vascular: Major intracranial vascular flow voids are preserved. Following contrast the major dural venous sinuses are enhancing and appear to be patent. Skull and upper cervical spine: Negative visible cervical spine, spinal cord. Visualized bone marrow signal is within normal limits. Sinuses/Orbits: Negative. Other: Mastoids are well aerated. Visible internal auditory structures appear normal. Negative visible scalp and face. IMPRESSION: 1.  No metastatic disease or acute intracranial abnormality. 2. Very mild for age chronic small vessel  ischemia. Electronically Signed   By: Odessa Fleming M.D.   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  PET scan results from February 14, 2023 reviewed independently and reported as above confirming stage of disease although patient does not have metastatic disease outside his chest.  MRI of the brain on February 14, 2023 was negative for metastatic disease.  Patient wishes to pursue chemotherapy, therefore will receive carboplatinum, etoposide, and Tecentriq on day 1 with etoposide only on day 2 and 3.  Patient will require Fulphilia support.  Plan to do 4 cycles every 3 weeks and then reimage with PET scan.  If improvement of disease burden, will transition to Tecentriq maintenance every 3 weeks.  Patient has had port placement.  Patient tolerated cycle 1 of treatment last week relatively well.  Will delay the start of cycle two 1 week secondary to Thanksgiving holiday.  Patient return to clinic in 3 weeks for further evaluation and consideration of cycle 2, day 1.    Clinical stage IIB squamous cell carcinoma, left upper lobe lung:  Patient underwent biopsy with navigational bronchoscopy on October 19, 2018 confirming diagnosis.  Patient declined surgery and wished to pursue XRT along with concurrent chemotherapy.  Given his stage of disease he did not receive maintenance immunotherapy.  He completed cycle 7 of weekly carboplatinum and Taxol on January 09, 2019 and then completed XRT on January 14, 2019.   History of melanoma: Unclear stage or depth, although by report was in situ.  Patient had Mohs surgery in fall 2019.  Previous lung biopsy consistent with squamous cell carcinoma. Anxiety/depression: Continue current medications as prescribed.  Continue follow-up with primary care physician as well as psychiatry at the [place]. Renal insufficiency: Creatinine mildly  improved to 1.47. Shortness of breath/cough: Patient recently underwent thoracentesis removing 2 L of fluid.  Patient has now had pleurocentesis 3 times.  If he requires an additional treatment, will consider Pleurx catheter at that time.  Continue oxygen as prescribed. Pain: Continue oxycodone as needed. Fever/congestion: Patient was instructed to take a COVID test when he returned home which turned positive.  His wife was also positive for COVID.  He was given a prescription for Paxlovid.  Patient expressed understanding and was in agreement with this plan. He also understands that He can call clinic at any time with any questions, concerns, or complaints.    Cancer Staging  Small cell lung cancer, left [facility]) Staging form: Lung, AJCC 8th Edition - Clinical stage from 02/24/2023: Stage IVA (cT4, cN2, cM1a) - Signed by Jeralyn Ruths, MD on 02/24/2023  Squamous cell carcinoma lung, left (HCC) Staging form: Lung, AJCC 8th Edition - Clinical stage from 10/27/2018: Stage IIB (cT1c, cN1, cM0) - Signed by Jeralyn Ruths, MD on 11/16/2018   Jeralyn Ruths,  MD   03/14/2023 6:44 AM

## 2023-03-13 NOTE — Progress Notes (Unsigned)
Cough, head congestion, and scratchy throat since the end of last week. Had a low grade fever of 99.5 yesterday. Feeling weak and tired.

## 2023-03-14 ENCOUNTER — Encounter: Payer: Self-pay | Admitting: Oncology

## 2023-03-16 ENCOUNTER — Encounter: Payer: Self-pay | Admitting: Hospice and Palliative Medicine

## 2023-03-19 ENCOUNTER — Encounter: Payer: Self-pay | Admitting: Oncology

## 2023-03-20 ENCOUNTER — Ambulatory Visit
Admission: RE | Admit: 2023-03-20 | Discharge: 2023-03-20 | Disposition: A | Discharge status: Home / Self Care | Payer: Medicare Other | Source: Ambulatory Visit | Attending: Student in an Organized Health Care Education/Training Program

## 2023-03-20 ENCOUNTER — Ambulatory Visit
Admission: RE | Admit: 2023-03-20 | Discharge: 2023-03-20 | Disposition: A | Discharge status: Home / Self Care | Payer: Medicare Other | Source: Ambulatory Visit | Attending: Radiology | Admitting: Radiology

## 2023-03-20 ENCOUNTER — Other Ambulatory Visit: Payer: Self-pay | Admitting: *Deleted

## 2023-03-20 ENCOUNTER — Telehealth: Payer: Self-pay | Admitting: *Deleted

## 2023-03-20 ENCOUNTER — Other Ambulatory Visit: Payer: Self-pay | Admitting: Radiology

## 2023-03-20 DIAGNOSIS — J90 Pleural effusion, not elsewhere classified: Secondary | ICD-10-CM | POA: Insufficient documentation

## 2023-03-20 DIAGNOSIS — C3492 Malignant neoplasm of unspecified part of left bronchus or lung: Secondary | ICD-10-CM | POA: Insufficient documentation

## 2023-03-20 DIAGNOSIS — Z9889 Other specified postprocedural states: Secondary | ICD-10-CM

## 2023-03-20 DIAGNOSIS — R0602 Shortness of breath: Secondary | ICD-10-CM

## 2023-03-20 LAB — BODY FLUID CELL COUNT WITH DIFFERENTIAL
Eos, Fluid: 1 %
Lymphs, Fluid: 56 %
Monocyte-Macrophage-Serous Fluid: 38 %
Neutrophil Count, Fluid: 5 %
Total Nucleated Cell Count, Fluid: 1930 uL

## 2023-03-20 LAB — GLUCOSE, PLEURAL OR PERITONEAL FLUID: Glucose, Fluid: 118 mg/dL

## 2023-03-20 LAB — PROTEIN, PLEURAL OR PERITONEAL FLUID: Total protein, fluid: <3 g/dL

## 2023-03-20 MED ORDER — LIDOCAINE HCL (PF) 1 % IJ SOLN
10.0000 mL | Freq: Once | INTRAMUSCULAR | Status: AC
  Administered 2023-03-20: 10 mL via INTRADERMAL
  Filled 2023-03-20: qty 10

## 2023-03-20 NOTE — Telephone Encounter (Signed)
RN placed call to patients wife Levi [name] regarding need for thoracentesis due to increased pressure and shortness of breath. Patient is scheduled for thoracentesis at 2:30 pm today, patient to arrive at 2:00 pm in the medical mall. Patients wife confirms appointment and will reach out with any other questions or concerns. Patient is scheduled to see Dr. Aundria Rud tomorrow 11/19.

## 2023-03-20 NOTE — Telephone Encounter (Signed)
Wife called reporting that the fluid has built back up in patient left lung, he is having difficulty breathing and his O2 sats are low staying in the 80's with O2 supplementation and he even dropped down to 60 briefly over the weekend. She is asking that he be scheduled to have fluid drawn off today. Please advise

## 2023-03-20 NOTE — Telephone Encounter (Signed)
Thank you for letting me know. Will get the PleurX scheduled after I see him.

## 2023-03-20 NOTE — Telephone Encounter (Signed)
Dr. Finnegan patient

## 2023-03-20 NOTE — Procedures (Signed)
Ultrasound-guided diagnostic and therapeutic left sided thoracentesis performed yielding 1 liter of serosanguinous colored fluid. No immediate complications.  Diagnostic fluid was sent to the lab for further analysis. Follow-up chest x-ray pending. EBL is < 2 ml .

## 2023-03-21 ENCOUNTER — Encounter: Payer: Self-pay | Admitting: Student in an Organized Health Care Education/Training Program

## 2023-03-21 ENCOUNTER — Ambulatory Visit: Payer: Medicare Other | Admitting: Student in an Organized Health Care Education/Training Program

## 2023-03-21 VITALS — BP 136/78 | HR 66 | Temp 97.8°F | Ht 72.0 in | Wt 267.0 lb

## 2023-03-21 DIAGNOSIS — J439 Emphysema, unspecified: Secondary | ICD-10-CM | POA: Diagnosis not present

## 2023-03-21 DIAGNOSIS — C3492 Malignant neoplasm of unspecified part of left bronchus or lung: Secondary | ICD-10-CM | POA: Diagnosis not present

## 2023-03-21 DIAGNOSIS — J910 Malignant pleural effusion: Secondary | ICD-10-CM

## 2023-03-21 DIAGNOSIS — J9611 Chronic respiratory failure with hypoxia: Secondary | ICD-10-CM

## 2023-03-21 LAB — TRIGLYCERIDES, BODY FLUIDS: Triglycerides, Fluid: 28 mg/dL

## 2023-03-21 NOTE — Progress Notes (Signed)
Assessment & Plan:   #Chronic Hypoxic Respiratory Failure #Extensive Stage Small Cell Lung Cancer #History of Squamous Cell Lung Cancer (in remission) #Malignant Pleural Effusion #Pulmonary Emphysema  Presenting today for follow-up for chronic hypoxic respiratory failure secondary to a combination of extensive stage small cell lung cancer as well as emphysema with the finding of a left-sided recurrent pleural effusion.  He continues on oxygen therapy and has required multiple thoracenteses for management of that effusion.  I have discussed with Levi [name] that his options include placement of an indwelling pleural catheter (Pleurx) versus continuing with frequent usual thoracostomy for fluid drainage.  I explained the risks and benefits of each procedure (increased risk of infection with an indwelling catheter versus increased risk of bleed with recurrent thoracentesis) and he would prefer to continue with thoracenteses while he waits for the effect of chemotherapy to kick in.  He would rather not have a catheter placed in his chest at this point.  I explained that should the frequency with which he is getting the thoracentesis we would have to proceed with IPC placement.  I also would prefer that he hold his Eliquis for at least 24 but preferably 48 hours prior to his next paracentesis which we will help facilitate in 10 days.  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  He continues to use close and will continue with oxygen therapy until such time that his malignancy is under control.  - US THORACENTESIS ASP PLEURAL SPACE W/IMG GUIDE; Future   Return in about 4 weeks (around 04/18/2023).  I spent 31 minutes caring for this patient today, including preparing to see the patient, obtaining a medical history , reviewing a separately obtained history, performing a medically  appropriate examination and/or evaluation, counseling and educating the patient/family/caregiver, ordering medications, tests, or procedures, documenting clinical information in the electronic health record, and independently interpreting results (not separately reported/billed) and communicating results to the patient/family/caregiver  [name], MD [facility] 03/21/2023 1:26 PM    End of visit medications:  No orders of the defined types were placed in this encounter.    Current Outpatient Medications:    albuterol (PROVENTIL HFA;VENTOLIN HFA) 108 (90 Base) MCG/ACT inhaler, Inhale 2 puffs into the lungs every 6 (six) hours as needed for wheezing or shortness of breath., Disp: 1 Inhaler, Rfl: 0   albuterol (PROVENTIL) (2.5 MG/3ML) 0.083% nebulizer solution, Take 2.5 mg by nebulization every 6 (six) hours as needed for wheezing or shortness of breath., Disp: , Rfl:    apixaban (ELIQUIS) 5 MG TABS tablet, Take 2.5 mg by mouth 2 (two) times daily., Disp: , Rfl:    carboxymethylcellulose (REFRESH PLUS) 0.5 % SOLN, 1 drop 4 (four) times daily as needed., Disp: , Rfl:    finasteride (PROSCAR) 5 MG tablet, Take 5 mg by mouth daily., Disp: , Rfl:    fluticasone-salmeterol (ADVAIR) 250-50 MCG/ACT AEPB, Inhale 1 puff into the lungs in the morning and at bedtime., Disp: , Rfl:    glimepiride (AMARYL) 2 MG tablet, Take 2 mg by mouth daily with breakfast., Disp: , Rfl:    levothyroxine (SYNTHROID) 200 MCG tablet, Take 200 mcg by mouth daily before breakfast., Disp: , Rfl:    lidocaine-prilocaine (EMLA) cream, Apply to affected area once, Disp: 30 g, Rfl: 3   ondansetron (ZOFRAN) 8 MG tablet, Take 1 tablet (8 mg total)  by mouth every 8 (eight) hours as needed for nausea, vomiting or refractory nausea / vomiting. Start on the third day after carboplatin., Disp: 60 tablet, Rfl: 2   Oxycodone HCl 10 MG TABS, Take 1 tablet (10 mg total) by mouth every 6 (six) hours as needed. Okay to  cut tablet in half if needed., Disp: 60 tablet, Rfl: 0   prochlorperazine (COMPAZINE) 10 MG tablet, Take 1 tablet (10 mg total) by mouth every 6 (six) hours as needed for nausea or vomiting., Disp: 60 tablet, Rfl: 2   SEMAGLUTIDE,0.25 OR 0.5MG /DOS, [place], Inject 0.5 mg into the skin once a week., Disp: , Rfl:    tamsulosin (FLOMAX) 0.4 MG CAPS capsule, 0.8 mg daily., Disp: , Rfl:    Tiotropium Bromide Monohydrate (SPIRIVA RESPIMAT) 2.5 MCG/ACT AERS, Inhale 2 puffs into the lungs daily., Disp: 1 each, Rfl: 11   torsemide (DEMADEX) 20 MG tablet, TAKE 1 TABLET BY MOUTH EVERY OTHER DAY, Disp: 45 tablet, Rfl: 2   vitamin B-12 (CYANOCOBALAMIN) 1000 MCG tablet, Take 1,000 mcg by mouth daily., Disp: , Rfl:    Subjective:   PATIENT ID: Levi [name] GENDER: male DOB: [birth year], MRN: [id number]  Chief Complaint  Patient presents with   Follow-up    SOB and prod cough with clear to grey sputum. Covid last Monday.     HPI  Levi [name] is a pleasant 82 year old male presenting to clinic for follow up.  I last saw Levi [name] on February 13, 2023 to evaluate findings noted on chest CT concerning for malignancy.  I referred him to interventional radiology and he underwent a CT-guided biopsy of the pleural-based mass which was notable for small cell lung cancer.  He also underwent thoracentesis during the same visit with improvement in his shortness of breath. He has since underwent a total of 4 thoracenteses for fluid drainage secondary to increased shortness of breath.  He also underwent 1 cycle of chemotherapy.  Given he has required multiple thoracenteses, he is referred back to me for consideration of an indwelling pleural catheter.  Interim history notes symptom improvement following thoracentesis with quick reaccumulation.  His last thoracentesis was yesterday and was somewhat painful.  He also reports a cough that continues.  He is maintained on triple therapy with LAMA/LABA/ICS.  He is presenting  today for follow-up with his wife.   Patient underwent restaging CT in October 2024 which returned with findings that are highly concerning for recurrence of his malignancy.  His chest CT is showed a left-sided pleural effusion, pleural nodularity throughout the left hemithorax, a large bulky left hilar mass, enlarged mediastinal and hilar lymph nodes, and the new cavitary nodule in the right pulmonary apex.  Biopsy had shown small cell lung cancer and he was last seen by Dr. Orlie Dakin on 03/13/2023 for management of extensive stage small cell lung cancer with negative brain MRI.  He started on carboplatinum, etoposide, and atezolizumab and has received only 1 cycle.   He quit smoking around the time of his diagnosis with his initial lung cancer. At that time, he underwent navigational bronchoscopy with biopsies. He underwent radiation therapy to the LUL mass. He has a history of Afib for which he is followed by cardiology, he is on Eliquis.   He did smoke for a long time, with at least 50 to 60 pack years on him. 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 He is currently retired.   PFT's performed 04/22/2022 showed FEV1/FVC at 59% and FEV1 at 69% predicted, post bronchodilator FEV1 was 84% predicted.   Ancillary information including prior medications, full medical/surgical/family/social histories, and PFTs (when available) are listed below and have been reviewed.   Review of Systems  Constitutional:  Negative for chills and fever.  HENT:  Positive for hearing loss. Negative for ear pain and tinnitus.   Respiratory:  Positive for cough and shortness of breath. Negative for hemoptysis, sputum production and wheezing.   Cardiovascular:  Negative for chest pain.     Objective:   Vitals:   03/21/23 1121  BP: 136/78  Pulse: 66  Temp: 97.8  F (36.6 C)  TempSrc: Temporal  SpO2: 90%  Weight: 267 lb (121.1 kg)  Height: 6' (1.829 m)   90% on 2 L  BMI Readings from Last 3 Encounters:  03/21/23 36.21 kg/m  03/13/23 36.35 kg/m  03/06/23 36.35 kg/m   Wt Readings from Last 3 Encounters:  03/21/23 267 lb (121.1 kg)  03/06/23 268 lb (121.6 kg)  03/03/23 262 lb (118.8 kg)    Physical Exam Constitutional:      Appearance: He is obese.  HENT:     Head: Normocephalic.     Nose: Nose normal.     Mouth/Throat:     Mouth: Mucous membranes are moist.  Eyes:     Extraocular Movements: Extraocular movements intact.  Cardiovascular:     Rate and Rhythm: Normal rate. Rhythm irregular.     Heart sounds: Normal heart sounds.  Pulmonary:     Effort: Pulmonary effort is normal.     Breath sounds: No wheezing.     Comments: Distant sounds Abdominal:     General: There is distension.     Palpations: Abdomen is soft.  Neurological:     General: No focal deficit present.     Mental Status: He is alert and oriented to person, place, and time. Mental status is at baseline.       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HCC)    Prostate enlargement    Shortness of breath    Squamous cell carcinoma of lung, left (HCC)    Thyroid disease    Tobacco abuse      Family History  Problem Relation Age of Onset   Aneurysm Mother    Cancer Father      Past Surgical History:  Procedure Laterality Date   CARDIOVERSION N/A    CARDIOVERSION N/A 11/24/2021   Procedure: CARDIOVERSION;  Surgeon: Yvonne Kendall, MD;  Location: [facility] ORS;  Service: Cardiovascular;  Laterality: N/A;   ELECTROMAGNETIC NAVIGATION BROCHOSCOPY Left 10/19/2018   Procedure: ELECTROMAGNETIC NAVIGATION BRONCHOSCOPY LEFT;  Surgeon: Salena Saner, MD;  Location:  [facility] ORS;  Service: Cardiopulmonary;  Laterality: Left;   ENDOBRONCHIAL ULTRASOUND Left 10/19/2018   Procedure: ENDOBRONCHIAL ULTRASOUND LEFT;  Surgeon: Salena Saner, MD;  Location: [facility] ORS;  Service: Cardiopulmonary;  Laterality: Left;   IR IMAGING GUIDED PORT INSERTION  03/03/2023   IR THORACENTESIS ASP PLEURAL SPACE W/IMG GUIDE  03/01/2023   MELANOMA EXCISION Left    NO PAST SURGERIES  Social History   Socioeconomic History   Marital status: Married    Spouse name: Olegario Messier   Number of children: 5   Years of education: Not on file   Highest education level: Not on file  Occupational History   Not on file  Tobacco Use   Smoking status: Former    Current packs/day: 0.00    Average packs/day: 0.3 packs/day for 62.0 years (15.5 ttl pk-yrs)    Types: Cigarettes    Start date: 09/04/1956    Quit date: 09/05/2018    Years since quitting: 4.5   Smokeless tobacco: Never  Vaping Use   Vaping status: Never Used  Substance and Sexual Activity   Alcohol use: No    Alcohol/week: 0.0 standard drinks of alcohol   Drug use: No   Sexual activity: Not on file  Other Topics Concern   Not on file  Social History Narrative   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 He is married to his wife of 30+ years, Olegario Messier and has 5 children (1 deceased), many grand children, and one great grand child.    Social Determinants of Health   Financial Resource Strain: Low Risk  (12/04/2018)   Overall Financial Resource Strain (CARDIA)    Difficulty of Paying Living Expenses: Not very hard  Food Insecurity: No Food Insecurity (12/04/2018)   Hunger Vital Sign    Worried About Running Out of Food in the Last Year: Never true    Ran Out of Food in the Last Year: Never true  Transportation Needs: No Transportation Needs (12/04/2018)   PRAPARE -  Administrator, Civil Service (Medical): No    Lack of Transportation (Non-Medical): No  Physical Activity: Not on file  Stress: Stress Concern Present (12/04/2018)   [employer] of Occupational Health - Occupational Stress Questionnaire    Feeling of Stress : Very much  Social Connections: Unknown (12/04/2018)   Social Connection and Isolation Panel [NHANES]    Frequency of Communication with Friends and Family: More than three times a week    Frequency of Social Gatherings with Friends and Family: Once a week    Attends Religious Services: Not on Insurance claims handler of Clubs or Organizations: Not on file    Attends Banker Meetings: Not on file    Marital Status: Not on file  Intimate Partner Violence: Not on file     No Known Allergies   CBC    Component Value Date/Time   WBC 5.5 03/13/2023 0904   WBC 6.4 02/27/2023 0838   RBC 4.82 03/13/2023 0904   HGB 14.8 03/13/2023 0904   HCT 45.1 03/13/2023 0904   PLT 76 (L) 03/13/2023 0904   MCV 93.6 03/13/2023 0904   MCV 90.0 10/20/2015 1020   MCH 30.7 03/13/2023 0904   MCHC 32.8 03/13/2023 0904   RDW 12.7 03/13/2023 0904   LYMPHSABS 0.4 (L) 03/13/2023 0904   MONOABS 0.0 (L) 03/13/2023 0904   EOSABS 0.1 03/13/2023 0904   BASOSABS 0.0 03/13/2023 0904    Pulmonary Functions Testing Results:    Latest Ref Rng & Units 04/22/2022   12:02 PM  PFT Results  FVC-Pre L 3.75   FVC-Predicted Pre % 85   FVC-Post L 4.15   FVC-Predicted Post % 93   Pre FEV1/FVC % %  59   Post FEV1/FCV % % 64   FEV1-Pre L 2.20   FEV1-Predicted Pre % 69   FEV1-Post L 2.66   DLCO uncorrected ml/min/mmHg 11.02   DLCO UNC% % 42   DLVA Predicted % 47   TLC L 6.33   TLC % Predicted % 84   RV % Predicted % 78     Outpatient Medications Prior to Visit  Medication Sig Dispense Refill   albuterol (PROVENTIL HFA;VENTOLIN HFA) 108 (90 Base) MCG/ACT inhaler Inhale 2 puffs into the lungs every 6 (six) hours as needed for wheezing  or shortness of breath. 1 Inhaler 0   albuterol (PROVENTIL) (2.5 MG/3ML) 0.083% nebulizer solution Take 2.5 mg by nebulization every 6 (six) hours as needed for wheezing or shortness of breath.     apixaban (ELIQUIS) 5 MG TABS tablet Take 2.5 mg by mouth 2 (two) times daily.     carboxymethylcellulose (REFRESH PLUS) 0.5 % SOLN 1 drop 4 (four) times daily as needed.     finasteride (PROSCAR) 5 MG tablet Take 5 mg by mouth daily.     fluticasone-salmeterol (ADVAIR) 250-50 MCG/ACT AEPB Inhale 1 puff into the lungs in the morning and at bedtime.     glimepiride (AMARYL) 2 MG tablet Take 2 mg by mouth daily with breakfast.     levothyroxine (SYNTHROID) 200 MCG tablet Take 200 mcg by mouth daily before breakfast.     lidocaine-prilocaine (EMLA) cream Apply to affected area once 30 g 3   ondansetron (ZOFRAN) 8 MG tablet Take 1 tablet (8 mg total) by mouth every 8 (eight) hours as needed for nausea, vomiting or refractory nausea / vomiting. Start on the third day after carboplatin. 60 tablet 2   Oxycodone HCl 10 MG TABS Take 1 tablet (10 mg total) by mouth every 6 (six) hours as needed. Okay to cut tablet in half if needed. 60 tablet 0   prochlorperazine (COMPAZINE) 10 MG tablet Take 1 tablet (10 mg total) by mouth every 6 (six) hours as needed for nausea or vomiting. 60 tablet 2   SEMAGLUTIDE,0.25 OR 0.5MG /DOS, [place] Inject 0.5 mg into the skin once a week.     tamsulosin (FLOMAX) 0.4 MG CAPS capsule 0.8 mg daily.     Tiotropium Bromide Monohydrate (SPIRIVA RESPIMAT) 2.5 MCG/ACT AERS Inhale 2 puffs into the lungs daily. 1 each 11   torsemide (DEMADEX) 20 MG tablet TAKE 1 TABLET BY MOUTH EVERY OTHER DAY 45 tablet 2   vitamin B-12 (CYANOCOBALAMIN) 1000 MCG tablet Take 1,000 mcg by mouth daily.     No facility-administered medications prior to visit.

## 2023-03-22 LAB — CYTOLOGY - NON PAP

## 2023-03-23 LAB — CHOLESTEROL, BODY FLUID: Cholesterol, Fluid: 53 mg/dL

## 2023-03-24 LAB — BODY FLUID CULTURE W GRAM STAIN: Culture: NO GROWTH

## 2023-03-27 ENCOUNTER — Other Ambulatory Visit: Payer: Medicare Other

## 2023-03-27 ENCOUNTER — Ambulatory Visit: Payer: Medicare Other

## 2023-03-27 ENCOUNTER — Ambulatory Visit: Payer: Medicare Other | Admitting: Oncology

## 2023-03-28 ENCOUNTER — Ambulatory Visit: Payer: Medicare Other

## 2023-03-29 ENCOUNTER — Ambulatory Visit: Payer: Medicare Other

## 2023-03-29 MED FILL — Fosaprepitant Dimeglumine For IV Infusion 150 MG (Base Eq): INTRAVENOUS | Qty: 5 | Status: AC

## 2023-03-31 ENCOUNTER — Ambulatory Visit: Admission: RE | Admit: 2023-03-31 | Payer: Medicare Other | Source: Ambulatory Visit

## 2023-04-03 ENCOUNTER — Inpatient Hospital Stay: Payer: Medicare Other

## 2023-04-03 ENCOUNTER — Inpatient Hospital Stay (HOSPITAL_BASED_OUTPATIENT_CLINIC_OR_DEPARTMENT_OTHER): Payer: Medicare Other | Admitting: Hospice and Palliative Medicine

## 2023-04-03 ENCOUNTER — Encounter: Payer: Self-pay | Admitting: Oncology

## 2023-04-03 ENCOUNTER — Inpatient Hospital Stay (HOSPITAL_BASED_OUTPATIENT_CLINIC_OR_DEPARTMENT_OTHER): Payer: Medicare Other | Admitting: Oncology

## 2023-04-03 ENCOUNTER — Inpatient Hospital Stay: Payer: Medicare Other | Attending: Oncology

## 2023-04-03 VITALS — BP 111/58 | HR 60 | Temp 97.6°F | Wt 268.0 lb

## 2023-04-03 VITALS — BP 135/70 | HR 73 | Temp 97.5°F | Resp 20 | Ht 72.0 in | Wt 268.0 lb

## 2023-04-03 DIAGNOSIS — Z5189 Encounter for other specified aftercare: Secondary | ICD-10-CM | POA: Diagnosis not present

## 2023-04-03 DIAGNOSIS — C3492 Malignant neoplasm of unspecified part of left bronchus or lung: Secondary | ICD-10-CM

## 2023-04-03 DIAGNOSIS — C3412 Malignant neoplasm of upper lobe, left bronchus or lung: Secondary | ICD-10-CM | POA: Diagnosis present

## 2023-04-03 DIAGNOSIS — Z79899 Other long term (current) drug therapy: Secondary | ICD-10-CM | POA: Diagnosis not present

## 2023-04-03 DIAGNOSIS — Z515 Encounter for palliative care: Secondary | ICD-10-CM

## 2023-04-03 DIAGNOSIS — Z5111 Encounter for antineoplastic chemotherapy: Secondary | ICD-10-CM | POA: Insufficient documentation

## 2023-04-03 LAB — CMP (CANCER CENTER ONLY)
ALT: 14 U/L (ref 0–44)
AST: 16 U/L (ref 15–41)
Albumin: 3 g/dL — ABNORMAL LOW (ref 3.5–5.0)
Alkaline Phosphatase: 59 U/L (ref 38–126)
Anion gap: 8 (ref 5–15)
BUN: 13 mg/dL (ref 8–23)
CO2: 25 mmol/L (ref 22–32)
Calcium: 8.1 mg/dL — ABNORMAL LOW (ref 8.9–10.3)
Chloride: 89 mmol/L — ABNORMAL LOW (ref 98–111)
Creatinine: 1.16 mg/dL (ref 0.61–1.24)
GFR, Estimated: >60 mL/min (ref >60)
Glucose, Bld: 128 mg/dL — ABNORMAL HIGH (ref 70–99)
Potassium: 3.9 mmol/L (ref 3.5–5.1)
Sodium: 122 mmol/L — ABNORMAL LOW (ref 135–145)
Total Bilirubin: 0.8 mg/dL (ref <1.2)
Total Protein: 5.9 g/dL — ABNORMAL LOW (ref 6.5–8.1)

## 2023-04-03 LAB — CBC WITH DIFFERENTIAL (CANCER CENTER ONLY)
Abs Immature Granulocytes: 0 10*3/uL (ref 0.00–0.07)
Basophils Absolute: 0 10*3/uL (ref 0.0–0.1)
Basophils Relative: 0 %
Eosinophils Absolute: 0 10*3/uL (ref 0.0–0.5)
Eosinophils Relative: 0 %
HCT: 39.5 % (ref 39.0–52.0)
Hemoglobin: 13.3 g/dL (ref 13.0–17.0)
Lymphocytes Relative: 7 %
Lymphs Abs: 0.6 10*3/uL — ABNORMAL LOW (ref 0.7–4.0)
MCH: 30.2 pg (ref 26.0–34.0)
MCHC: 33.7 g/dL (ref 30.0–36.0)
MCV: 89.6 fL (ref 80.0–100.0)
Monocytes Absolute: 1.2 10*3/uL — ABNORMAL HIGH (ref 0.1–1.0)
Monocytes Relative: 13 %
Neutro Abs: 7.2 10*3/uL (ref 1.7–7.7)
Neutrophils Relative %: 80 %
Platelet Count: 366 10*3/uL (ref 150–400)
RBC: 4.41 MIL/uL (ref 4.22–5.81)
RDW: 13.5 % (ref 11.5–15.5)
Smear Review: NORMAL
WBC Count: 9 10*3/uL (ref 4.0–10.5)
nRBC: 0 % (ref 0.0–0.2)

## 2023-04-03 MED ORDER — SODIUM CHLORIDE 0.9 % IV SOLN
1200.0000 mg | Freq: Once | INTRAVENOUS | Status: AC
  Administered 2023-04-03: 1200 mg via INTRAVENOUS
  Filled 2023-04-03: qty 20

## 2023-04-03 MED ORDER — PALONOSETRON HCL INJECTION 0.25 MG/5ML
0.2500 mg | Freq: Once | INTRAVENOUS | Status: AC
  Administered 2023-04-03: 0.25 mg via INTRAVENOUS
  Filled 2023-04-03: qty 5

## 2023-04-03 MED ORDER — DEXAMETHASONE SODIUM PHOSPHATE 10 MG/ML IJ SOLN
10.0000 mg | Freq: Once | INTRAMUSCULAR | Status: AC
  Administered 2023-04-03: 10 mg via INTRAVENOUS
  Filled 2023-04-03: qty 1

## 2023-04-03 MED ORDER — SODIUM CHLORIDE 0.9 % IV SOLN
150.0000 mg | Freq: Once | INTRAVENOUS | Status: AC
  Administered 2023-04-03: 150 mg via INTRAVENOUS
  Filled 2023-04-03: qty 150

## 2023-04-03 MED ORDER — HEPARIN SOD (PORK) LOCK FLUSH 100 UNIT/ML IV SOLN
500.0000 [IU] | Freq: Once | INTRAVENOUS | Status: AC | PRN
  Administered 2023-04-03: 500 [IU]
  Filled 2023-04-03: qty 5

## 2023-04-03 MED ORDER — SODIUM CHLORIDE 0.9 % IV SOLN
INTRAVENOUS | Status: DC
  Filled 2023-04-03: qty 250

## 2023-04-03 MED ORDER — SODIUM CHLORIDE 0.9 % IV SOLN
563.0000 mg | Freq: Once | INTRAVENOUS | Status: AC
  Administered 2023-04-03: 560 mg via INTRAVENOUS
  Filled 2023-04-03: qty 56

## 2023-04-03 MED ORDER — SODIUM CHLORIDE 0.9 % IV SOLN
100.0000 mg/m2 | Freq: Once | INTRAVENOUS | Status: AC
  Administered 2023-04-03: 250 mg via INTRAVENOUS
  Filled 2023-04-03: qty 12.5

## 2023-04-03 NOTE — Progress Notes (Signed)
[facility] Clinical Social Work  Clinical Social Work was referred by medical provider for assessment of psychosocial needs.  Clinical Social Worker contacted patient by phone to offer support and assess for needs.    Spoke with patient and his wife, Olegario Messier, via speaker phone.  Elouise Munroe, NP, referred patient for Advance Directives.  CSW provided information regarding the process and what the documents entail.  An appointment is scheduled for 12/12 at 10:30 prior to patient's labs.  No other issues expressed.     Dorothey Baseman, LCSW  Clinical Social Worker [facility]

## 2023-04-03 NOTE — Progress Notes (Signed)
Palliative Medicine [facility] at [facility] [phone] [phone]   Name: Levi [name] Date: 04/03/2023 MRN: [id number]  DOB: 1940-12-07  Patient Care Team: [facility] as PCP - General (General Practice) Debbe Odea, MD as PCP - Cardiology (Cardiology) Lanier Prude, MD as PCP - Electrophysiology (Cardiology) Glory Buff, RN as Registered Nurse Orlie Dakin, Tollie Pizza, MD as Consulting Physician (Oncology) Carmina Miller, MD as Referring Physician (Radiation Oncology) Hulda Marin, MD (Inactive) as Referring Physician (Cardiothoracic Surgery)    REASON FOR CONSULTATION: Levi [name] is a 82 y.o. 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  Patient was referred to palliative care to address goals manage ongoing symptoms.  SOCIAL HISTORY:     reports that he quit smoking about 4 years ago. His smoking use included cigarettes. He started smoking about 66 years ago. He has a 15.5 pack-year smoking history. He has never used smokeless tobacco. He reports that he does not drink alcohol and does not use drugs.  Patient is married and lives at home with his wife.  He has 3 adult children who live locally and 1 in [place].  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  ADVANCE DIRECTIVES:    CODE STATUS:   PAST MEDICAL HISTORY: Past Medical History:  Diagnosis Date   Asthma    COPD (chronic obstructive pulmonary disease) (HCC)    Hearing loss    Hypertension    Hypothyroidism    Mass of left lung    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HCC)    Prostate enlargement    Shortness of breath    Squamous cell carcinoma of lung, left (HCC)     Thyroid disease    Tobacco abuse     PAST SURGICAL HISTORY:  Past Surgical History:  Procedure Laterality Date   CARDIOVERSION N/A    CARDIOVERSION N/A 11/24/2021   Procedure: CARDIOVERSION;  Surgeon: Yvonne Kendall, MD;  Location: [facility] ORS;  Service: Cardiovascular;  Laterality: N/A;   ELECTROMAGNETIC NAVIGATION BROCHOSCOPY Left 10/19/2018   Procedure: ELECTROMAGNETIC NAVIGATION BRONCHOSCOPY LEFT;  Surgeon: Salena Saner, MD;  Location: [facility] ORS;  Service: Cardiopulmonary;  Laterality: Left;   ENDOBRONCHIAL ULTRASOUND Left 10/19/2018   Procedure: ENDOBRONCHIAL ULTRASOUND LEFT;  Surgeon: Salena Saner, MD;  Location: [facility] ORS;  Service: Cardiopulmonary;  Laterality: Left;   IR IMAGING GUIDED PORT INSERTION  03/03/2023   IR THORACENTESIS ASP PLEURAL SPACE W/IMG GUIDE  03/01/2023   MELANOMA EXCISION Left    NO PAST SURGERIES      HEMATOLOGY/ONCOLOGY HISTORY:  Oncology History  Squamous cell carcinoma lung, left (HCC)  10/27/2018 Initial Diagnosis   Squamous cell carcinoma lung, left (HCC)   10/27/2018 Cancer Staging   Staging form: Lung, AJCC 8th Edition - Clinical stage from 10/27/2018: Stage IIB (cT1c, cN1, cM0) - Signed by Jeralyn Ruths, MD on 11/16/2018   11/27/2018 - 01/09/2019 Chemotherapy   Patient is on Treatment Plan : LUNG Carboplatin / Paclitaxel + XRT q7d     Small cell lung cancer, left (HCC)  02/24/2023 Initial Diagnosis   Small cell lung cancer, left (HCC)   02/24/2023 Cancer Staging   Staging form: Lung, AJCC 8th Edition - Clinical stage from 02/24/2023: Stage IVA (  cT4, cN2, [id number]) - Signed by Jeralyn Ruths, MD on 02/24/2023   03/06/2023 -  Chemotherapy   Patient is on Treatment Plan : LUNG SCLC Carboplatin + Etoposide + Atezolizumab Induction q21d x 4 cycles / Atezolizumab Maintenance q21d       ALLERGIES:  has No Known Allergies.  MEDICATIONS:  Current Outpatient Medications  Medication Sig Dispense Refill   albuterol (PROVENTIL  HFA;VENTOLIN HFA) 108 (90 Base) MCG/ACT inhaler Inhale 2 puffs into the lungs every 6 (six) hours as needed for wheezing or shortness of breath. 1 Inhaler 0   albuterol (PROVENTIL) (2.5 MG/3ML) 0.083% nebulizer solution Take 2.5 mg by nebulization every 6 (six) hours as needed for wheezing or shortness of breath.     apixaban (ELIQUIS) 5 MG TABS tablet Take 2.5 mg by mouth 2 (two) times daily.     carboxymethylcellulose (REFRESH PLUS) 0.5 % SOLN 1 drop 4 (four) times daily as needed.     finasteride (PROSCAR) 5 MG tablet Take 5 mg by mouth daily.     fluticasone-salmeterol (ADVAIR) 250-50 MCG/ACT AEPB Inhale 1 puff into the lungs in the morning and at bedtime.     glimepiride (AMARYL) 2 MG tablet Take 2 mg by mouth daily with breakfast.     levothyroxine (SYNTHROID) 200 MCG tablet Take 200 mcg by mouth daily before breakfast.     lidocaine-prilocaine (EMLA) cream Apply to affected area once 30 g 3   ondansetron (ZOFRAN) 8 MG tablet Take 1 tablet (8 mg total) by mouth every 8 (eight) hours as needed for nausea, vomiting or refractory nausea / vomiting. Start on the third day after carboplatin. 60 tablet 2   Oxycodone HCl 10 MG TABS Take 1 tablet (10 mg total) by mouth every 6 (six) hours as needed. Okay to cut tablet in half if needed. 60 tablet 0   prochlorperazine (COMPAZINE) 10 MG tablet Take 1 tablet (10 mg total) by mouth every 6 (six) hours as needed for nausea or vomiting. 60 tablet 2   SEMAGLUTIDE,0.25 OR 0.5MG /DOS, [place] Inject 0.5 mg into the skin once a week.     tamsulosin (FLOMAX) 0.4 MG CAPS capsule 0.8 mg daily.     Tiotropium Bromide Monohydrate (SPIRIVA RESPIMAT) 2.5 MCG/ACT AERS Inhale 2 puffs into the lungs daily. 1 each 11   torsemide (DEMADEX) 20 MG tablet TAKE 1 TABLET BY MOUTH EVERY OTHER DAY 45 tablet 2   vitamin B-12 (CYANOCOBALAMIN) 1000 MCG tablet Take 1,000 mcg by mouth daily.     No current facility-administered medications for this visit.    VITAL SIGNS: There were no  vitals taken for this visit. There were no vitals filed for this visit.  Estimated body mass index is 36.35 kg/m as calculated from the following:   Height as of 03/21/23: 6' (1.829 m).   Weight as of an earlier encounter on 04/03/23: 268 lb (121.6 kg).  LABS: CBC:    Component Value Date/Time   WBC 9.0 04/03/2023 0829   WBC 6.4 02/27/2023 0838   HGB 13.3 04/03/2023 0829   HCT 39.5 04/03/2023 0829   PLT 366 04/03/2023 0829   MCV 89.6 04/03/2023 0829   MCV 90.0 10/20/2015 1020   NEUTROABS PENDING 04/03/2023 0829   LYMPHSABS PENDING 04/03/2023 0829   MONOABS PENDING 04/03/2023 0829   EOSABS PENDING 04/03/2023 0829   BASOSABS PENDING 04/03/2023 0829   Comprehensive Metabolic Panel:    Component Value Date/Time   NA 133 (L) 03/13/2023 0904   K 4.8 03/13/2023  0904   CL 99 03/13/2023 0904   CO2 25 03/13/2023 0904   BUN 31 (H) 03/13/2023 0904   CREATININE 1.47 (H) 03/13/2023 0904   CREATININE 1.29 10/31/2014 1159   GLUCOSE 102 (H) 03/13/2023 0904   CALCIUM 8.2 (L) 03/13/2023 0904   AST 20 03/13/2023 0904   ALT 15 03/13/2023 0904   ALKPHOS 66 03/13/2023 0904   BILITOT 1.4 (H) 03/13/2023 0904   PROT 5.8 (L) 03/13/2023 0904   ALBUMIN 3.1 (L) 03/13/2023 0904    RADIOGRAPHIC STUDIES: US THORACENTESIS ASP PLEURAL SPACE W/IMG GUIDE  Result Date: 03/20/2023 INDICATION: Patient with history of lung cancer with recurrent left-sided pleural effusion. Request is for therapeutic and diagnostic thoracentesis. EXAM: ULTRASOUND GUIDED THERAPEUTIC AND DIAGNOSTIC LEFT-SIDED THORACENTESIS MEDICATIONS: Lidocaine 1% 10 mL COMPLICATIONS: None immediate. PROCEDURE: An ultrasound guided thoracentesis was thoroughly discussed with the patient and questions answered. The benefits, risks, alternatives and complications were also discussed. The patient understands and wishes to proceed with the procedure. Written consent was obtained. Ultrasound was performed to localize and mark an adequate pocket of  fluid in the left chest. The area was then prepped and draped in the normal sterile fashion. 1% Lidocaine was used for local anesthesia. Under ultrasound guidance a 6 Fr Safe-T-Centesis catheter was introduced. Thoracentesis was performed. The catheter was removed and a dressing applied. FINDINGS: A total of approximately 1 L of serosanguineous fluid was removed. Samples were sent to the laboratory as requested by the clinical team. IMPRESSION: Successful ultrasound guided therapeutic and diagnostic left-sided thoracentesis yielding 1 L of serosanguineous of pleural fluid. Performed by Anders Grant NP Electronically Signed   By: Malachy Moan M.D.   On: 03/20/2023 16:00   DG Chest Port 1 View  Result Date: 03/20/2023 CLINICAL DATA:  Status post left thoracentesis EXAM: PORTABLE CHEST 1 VIEW COMPARISON:  Prior chest x-ray 03/09/2023 FINDINGS: Slight interval decrease in volume of the left pleural effusion with persistent left basilar airspace opacity. No evidence of pneumothorax. Stable cardiomegaly. Chronic bronchitic and interstitial changes throughout the right mid and lower lung. Right IJ approach single-lumen power injectable port catheter remains in good position. IMPRESSION: 1. No evidence of pneumothorax or other complication following left-sided thoracentesis. Electronically Signed   By: Malachy Moan M.D.   On: 03/20/2023 15:50   US THORACENTESIS ASP PLEURAL SPACE W/IMG GUIDE  Result Date: 03/09/2023 INDICATION: Patient with a history of lung cancer with recurrent pleural effusions. Interventional radiology asked to perform a therapeutic thoracentesis. EXAM: ULTRASOUND GUIDED THORACENTESIS MEDICATIONS: 1% lidocaine 10 mL COMPLICATIONS: None immediate. PROCEDURE: An ultrasound guided thoracentesis was thoroughly discussed with the patient and questions answered. The benefits, risks, alternatives and complications were also discussed. The patient understands and wishes to proceed with  the procedure. Written consent was obtained. Ultrasound was performed to localize and mark an adequate pocket of fluid in the LEFT chest. The area was then prepped and draped in the normal sterile fashion. 1% Lidocaine was used for local anesthesia. Under ultrasound guidance a 6 Fr Safe-T-Centesis catheter was introduced. Thoracentesis was performed. The catheter was removed and a dressing applied. FINDINGS: A total of approximately 1.4 L of amber colored fluid was removed. IMPRESSION: Successful ultrasound guided LEFT thoracentesis yielding 1.4 L of pleural fluid. Procedure performed by Alwyn Ren NP Electronically Signed   By: Roanna Banning M.D.   On: 03/09/2023 16:26   DG Chest Port 1 View  Result Date: 03/09/2023 CLINICAL DATA:  post thoracentesis.  LEFT. EXAM: PORTABLE CHEST 1 VIEW  COMPARISON:  IR thoracentesis, earlier same day. FINDINGS: Support lines: RIGHT chest port with catheter tip at the superior cavoatrial junction. The cardiac apex is obscured. Perihilar interstitial thickening. The RIGHT chest relatively clear. Mildly improved aeration of the LEFT chest, with no pneumothorax in small-to-moderate residual pleural effusion. LEFT basilar consolidation, unchanged. No interval osseous abnormality. IMPRESSION: 1. Mildly improved aeration of the LEFT chest with small-to-moderate volume residual pleural effusion. No pneumothorax 2. LEFT basilar consolidation, unchanged and likely to represent atelectasis though superimposed pneumonia can appear similar. Electronically Signed   By: Roanna Banning M.D.   On: 03/09/2023 16:01   DG Chest 2 View  Result Date: 03/09/2023 CLINICAL DATA:  Shortness of breath. EXAM: CHEST - 2 VIEW COMPARISON:  March 01, 2023. FINDINGS: Stable cardiomegaly with central pulmonary vascular congestion and probable bilateral pulmonary edema. Moderate size left pleural effusion is noted with associated atelectasis. Right internal jugular Port-A-Cath is noted with distal tip in  expected position of the SVC. IMPRESSION: Stable cardiomegaly with central pulmonary vascular congestion and probable bilateral pulmonary edema. Moderate size left pleural effusion with associated atelectasis. Electronically Signed   By: Lupita Raider M.D.   On: 03/09/2023 11:55    PERFORMANCE STATUS (ECOG) : 3 - Symptomatic, >50% confined to bed  Review of Systems Unless otherwise noted, a complete review of systems is negative.  Physical Exam General: NAD Pulmonary: Unlabored, on O2 Extremities: no edema, no joint deformities Skin: no rashes Neurological: Weakness but otherwise nonfocal  IMPRESSION: Patient seen in infusion.  Introduced palliative care services and attempted to establish therapeutic rapport.  Patient has history of squamous cell lung cancer now with extensive stage small cell lung cancer.  He also has multiple comorbidities including COPD and chronic respiratory failure on O2 (3 L).  Patient completed first cycle of chemotherapy and states that he tolerated it well.  He is optimistic and feels like treatment is already "working."  At baseline, patient lives at home with his wife.  He is severely limited by his chronic respiratory failure.  He becomes exertionally dyspneic with ambulation and is mostly sedentary in the chair during the day.  Patient says that he has good support from family at home.  Patient denies significant symptomatic complaints or concerns today.  He will benefit from discussing advance directives when his wife is present.  PLAN: -Continue current scope of treatment -Will benefit from ACP conversation -Referral to social work -RTC 3 weeks  Case and plan discussed with Dr. Orlie Dakin  Patient expressed understanding and was in agreement with this plan. He also understands that He can call the clinic at any time with any questions, concerns, or complaints.     Time Total: 20 minutes  Visit consisted of counseling and education dealing  with the complex and emotionally intense issues of symptom management and palliative care in the setting of serious and potentially life-threatening illness.Greater than 50%  of this time was spent counseling and coordinating care related to the above assessment and plan.  Signed by: Laurette Schimke, PhD, NP-C

## 2023-04-03 NOTE — Progress Notes (Signed)
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  INTERVAL HISTORY: Patient returns to clinic today for further evaluation and consideration of cycle 2 of carboplatinum, etoposide, and Tecentriq.  He continues to have shortness of breath, but states this is mildly improved.  He has chronic weakness and fatigue.  He has increased peripheral edema.  He is fully covered from his COVID infection.  He remains anxious.  He has no neurologic complaints.  He denies any chest pain, cough, or hemoptysis.  He has a good appetite and denies weight loss.  He denies any nausea, vomiting, constipation, or diarrhea.  He has no urinary complaints.  Patient offers no further specific complaints today.  REVIEW OF SYSTEMS:   Review of Systems  Constitutional:  Positive for malaise/fatigue. Negative for fever and weight loss.  HENT: Negative.  Negative for congestion.   Respiratory:  Positive for shortness of breath. Negative for cough and hemoptysis.   Cardiovascular:  Positive for leg swelling. Negative for chest pain.  Gastrointestinal: Negative.  Negative for abdominal pain and nausea.  Genitourinary: Negative.  Negative for dysuria.  Musculoskeletal: Negative.  Negative for back pain.  Skin:  Negative for rash.  Neurological:  Positive for weakness. Negative for dizziness, focal weakness and headaches.   Endo/Heme/Allergies:  Does not bruise/bleed easily.  Psychiatric/Behavioral:  Negative for depression. The patient is nervous/anxious.     As per HPI. Otherwise, a complete review of systems is negative.  PAST MEDICAL HISTORY: Past Medical History:  Diagnosis Date   Asthma    COPD (chronic obstructive pulmonary disease) (HCC)    Hearing loss    Hypertension    Hypothyroidism    Mass of left lung    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HCC)    Prostate enlargement    Shortness of breath    Squamous cell carcinoma of lung, left (HCC)    Thyroid disease    Tobacco abuse     PAST SURGICAL HISTORY: Past Surgical History:  Procedure Laterality Date   CARDIOVERSION N/A    CARDIOVERSION N/A 11/24/2021   Procedure: CARDIOVERSION;  Surgeon: Yvonne Kendall, MD;  Location: [facility] ORS;  Service: Cardiovascular;  Laterality: N/A;   ELECTROMAGNETIC NAVIGATION BROCHOSCOPY Left 10/19/2018   Procedure: ELECTROMAGNETIC NAVIGATION BRONCHOSCOPY LEFT;  Surgeon: Salena Saner, MD;  Location: [facility] ORS;  Service: Cardiopulmonary;  Laterality: Left;   ENDOBRONCHIAL ULTRASOUND Left 10/19/2018   Procedure: ENDOBRONCHIAL ULTRASOUND LEFT;  Surgeon: Salena Saner, MD;  Location: [facility] ORS;  Service: Cardiopulmonary;  Laterality: Left;   IR IMAGING GUIDED PORT INSERTION  03/03/2023   IR THORACENTESIS ASP PLEURAL SPACE W/IMG GUIDE  03/01/2023   MELANOMA EXCISION Left    NO PAST SURGERIES      FAMILY HISTORY: Family History  Problem Relation Age of Onset   Aneurysm Mother    Cancer Father     ADVANCED DIRECTIVES (Y/N):  N  HEALTH MAINTENANCE: Social History   Tobacco Use   Smoking status: Former    Current packs/day:  0.00    Average packs/day: 0.3 packs/day for 62.0 years (15.5 ttl pk-yrs)    Types: Cigarettes    Start date: 09/04/1956    Quit date: 09/05/2018    Years since quitting: 4.5   Smokeless tobacco: Never  Vaping Use   Vaping status: Never Used  Substance Use Topics   Alcohol use: No     Alcohol/week: 0.0 standard drinks of alcohol   Drug use: No     Colonoscopy:  PAP:  Bone density:  Lipid panel:  No Known Allergies  Current Outpatient Medications  Medication Sig Dispense Refill   albuterol (PROVENTIL HFA;VENTOLIN HFA) 108 (90 Base) MCG/ACT inhaler Inhale 2 puffs into the lungs every 6 (six) hours as needed for wheezing or shortness of breath. 1 Inhaler 0   albuterol (PROVENTIL) (2.5 MG/3ML) 0.083% nebulizer solution Take 2.5 mg by nebulization every 6 (six) hours as needed for wheezing or shortness of breath.     apixaban (ELIQUIS) 5 MG TABS tablet Take 2.5 mg by mouth 2 (two) times daily.     carboxymethylcellulose (REFRESH PLUS) 0.5 % SOLN 1 drop 4 (four) times daily as needed.     finasteride (PROSCAR) 5 MG tablet Take 5 mg by mouth daily.     fluticasone-salmeterol (ADVAIR) 250-50 MCG/ACT AEPB Inhale 1 puff into the lungs in the morning and at bedtime.     glimepiride (AMARYL) 2 MG tablet Take 2 mg by mouth daily with breakfast.     levothyroxine (SYNTHROID) 200 MCG tablet Take 200 mcg by mouth daily before breakfast.     lidocaine-prilocaine (EMLA) cream Apply to affected area once 30 g 3   ondansetron (ZOFRAN) 8 MG tablet Take 1 tablet (8 mg total) by mouth every 8 (eight) hours as needed for nausea, vomiting or refractory nausea / vomiting. Start on the third day after carboplatin. 60 tablet 2   Oxycodone HCl 10 MG TABS Take 1 tablet (10 mg total) by mouth every 6 (six) hours as needed. Okay to cut tablet in half if needed. 60 tablet 0   prochlorperazine (COMPAZINE) 10 MG tablet Take 1 tablet (10 mg total) by mouth every 6 (six) hours as needed for nausea or vomiting. 60 tablet 2   SEMAGLUTIDE,0.25 OR 0.5MG /DOS, [place] Inject 0.5 mg into the skin once a week.     tamsulosin (FLOMAX) 0.4 MG CAPS capsule 0.8 mg daily.     Tiotropium Bromide Monohydrate (SPIRIVA RESPIMAT) 2.5 MCG/ACT AERS Inhale 2 puffs into the lungs daily. 1 each 11   torsemide (DEMADEX) 20 MG  tablet TAKE 1 TABLET BY MOUTH EVERY OTHER DAY 45 tablet 2   vitamin B-12 (CYANOCOBALAMIN) 1000 MCG tablet Take 1,000 mcg by mouth daily.     No current facility-administered medications for this visit.   Facility-Administered Medications Ordered in Other Visits  Medication Dose Route Frequency Provider Last Rate Last Admin   0.9 %  sodium chloride infusion   Intravenous Continuous Jeralyn Ruths, MD   Stopped at 04/03/23 1312    OBJECTIVE: Vitals:   04/03/23 0850  BP: (!) 111/58  Pulse: 60  Temp: 97.6 F (36.4 C)  SpO2: 93%      Body mass index is 36.35 kg/m.    ECOG FS:2 - Symptomatic, <50% confined to bed  General: Well-developed, well-nourished, no acute distress. Eyes: Pink conjunctiva, anicteric sclera. HEENT: Normocephalic, moist mucous membranes. Lungs: No audible wheezing or coughing. Heart: Regular rate and rhythm. Abdomen: Soft, nontender, no obvious distention. Musculoskeletal: No edema, cyanosis,  or clubbing. Neuro: Alert, answering all questions appropriately. Cranial nerves grossly intact. Skin: No rashes or petechiae noted. Psych: Normal affect.  LAB RESULTS:  Lab Results  Component Value Date   NA 122 (L) 04/03/2023   K 3.9 04/03/2023   CL 89 (L) 04/03/2023   CO2 25 04/03/2023   GLUCOSE 128 (H) 04/03/2023   BUN 13 04/03/2023   CREATININE 1.16 04/03/2023   CALCIUM 8.1 (L) 04/03/2023   PROT 5.9 (L) 04/03/2023   ALBUMIN 3.0 (L) 04/03/2023   AST 16 04/03/2023   ALT 14 04/03/2023   ALKPHOS 59 04/03/2023   BILITOT 0.8 04/03/2023   GFRNONAA >60 04/03/2023   GFRAA 58 (L) 07/18/2019    Lab Results  Component Value Date   WBC 9.0 04/03/2023   NEUTROABS 7.2 04/03/2023   HGB 13.3 04/03/2023   HCT 39.5 04/03/2023   MCV 89.6 04/03/2023   PLT 366 04/03/2023     STUDIES: US THORACENTESIS ASP PLEURAL SPACE W/IMG GUIDE  Result Date: 03/20/2023 INDICATION: Patient with history of lung cancer with recurrent left-sided pleural effusion. Request is  for therapeutic and diagnostic thoracentesis. EXAM: ULTRASOUND GUIDED THERAPEUTIC AND DIAGNOSTIC LEFT-SIDED THORACENTESIS MEDICATIONS: Lidocaine 1% 10 mL COMPLICATIONS: None immediate. PROCEDURE: An ultrasound guided thoracentesis was thoroughly discussed with the patient and questions answered. The benefits, risks, alternatives and complications were also discussed. The patient understands and wishes to proceed with the procedure. Written consent was obtained. Ultrasound was performed to localize and mark an adequate pocket of fluid in the left chest. The area was then prepped and draped in the normal sterile fashion. 1% Lidocaine was used for local anesthesia. Under ultrasound guidance a 6 Fr Safe-T-Centesis catheter was introduced. Thoracentesis was performed. The catheter was removed and a dressing applied. FINDINGS: A total of approximately 1 L of serosanguineous fluid was removed. Samples were sent to the laboratory as requested by the clinical team. IMPRESSION: Successful ultrasound guided therapeutic and diagnostic left-sided thoracentesis yielding 1 L of serosanguineous of pleural fluid. Performed by Anders Grant NP Electronically Signed   By: Malachy Moan M.D.   On: 03/20/2023 16:00   DG Chest Port 1 View  Result Date: 03/20/2023 CLINICAL DATA:  Status post left thoracentesis EXAM: PORTABLE CHEST 1 VIEW COMPARISON:  Prior chest x-ray 03/09/2023 FINDINGS: Slight interval decrease in volume of the left pleural effusion with persistent left basilar airspace opacity. No evidence of pneumothorax. Stable cardiomegaly. Chronic bronchitic and interstitial changes throughout the right mid and lower lung. Right IJ approach single-lumen power injectable port catheter remains in good position. IMPRESSION: 1. No evidence of pneumothorax or other complication following left-sided thoracentesis. Electronically Signed   By: Malachy Moan M.D.   On: 03/20/2023 15:50   US THORACENTESIS ASP PLEURAL  SPACE W/IMG GUIDE  Result Date: 03/09/2023 INDICATION: Patient with a history of lung cancer with recurrent pleural effusions. Interventional radiology asked to perform a therapeutic thoracentesis. EXAM: ULTRASOUND GUIDED THORACENTESIS MEDICATIONS: 1% lidocaine 10 mL COMPLICATIONS: None immediate. PROCEDURE: An ultrasound guided thoracentesis was thoroughly discussed with the patient and questions answered. The benefits, risks, alternatives and complications were also discussed. The patient understands and wishes to proceed with the procedure. Written consent was obtained. Ultrasound was performed to localize and mark an adequate pocket of fluid in the LEFT chest. The area was then prepped and draped in the normal sterile fashion. 1% Lidocaine was used for local anesthesia. Under ultrasound guidance a 6 Fr Safe-T-Centesis catheter was introduced. Thoracentesis was performed. The catheter was removed and  a dressing applied. FINDINGS: A total of approximately 1.4 L of amber colored fluid was removed. IMPRESSION: Successful ultrasound guided LEFT thoracentesis yielding 1.4 L of pleural fluid. Procedure performed by Alwyn Ren NP Electronically Signed   By: Roanna Banning M.D.   On: 03/09/2023 16:26   DG Chest Port 1 View  Result Date: 03/09/2023 CLINICAL DATA:  post thoracentesis.  LEFT. EXAM: PORTABLE CHEST 1 VIEW COMPARISON:  IR thoracentesis, earlier same day. FINDINGS: Support lines: RIGHT chest port with catheter tip at the superior cavoatrial junction. The cardiac apex is obscured. Perihilar interstitial thickening. The RIGHT chest relatively clear. Mildly improved aeration of the LEFT chest, with no pneumothorax in small-to-moderate residual pleural effusion. LEFT basilar consolidation, unchanged. No interval osseous abnormality. IMPRESSION: 1. Mildly improved aeration of the LEFT chest with small-to-moderate volume residual pleural effusion. No pneumothorax 2. LEFT basilar consolidation, unchanged and  likely to represent atelectasis though superimposed pneumonia can appear similar. Electronically Signed   By: Roanna Banning M.D.   On: 03/09/2023 16:01   DG Chest 2 View  Result Date: 03/09/2023 CLINICAL DATA:  Shortness of breath. EXAM: CHEST - 2 VIEW COMPARISON:  March 01, 2023. FINDINGS: Stable cardiomegaly with central pulmonary vascular congestion and probable bilateral pulmonary edema. Moderate size left pleural effusion is noted with associated atelectasis. Right internal jugular Port-A-Cath is noted with distal tip in expected position of the SVC. IMPRESSION: Stable cardiomegaly with central pulmonary vascular congestion and probable bilateral pulmonary edema. Moderate size left pleural effusion with associated atelectasis. Electronically Signed   By: Lupita Raider M.D.   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  PET scan results from February 14, 2023 reviewed independently and reported as above confirming stage of disease although patient does not have metastatic disease outside his chest.  MRI of the brain on February 14, 2023 was negative for metastatic disease.  Patient wishes to pursue chemotherapy, therefore will receive carboplatinum, etoposide, and Tecentriq on day 1 with etoposide only on day 2 and 3.  Patient will require Fulphilia support.  Plan to do 4 cycles every 3 weeks and then reimage with PET scan.  If improvement of disease burden, will transition to Tecentriq maintenance every 3 weeks.  Patient has had port placement.  Proceed with cycle 2, day 1 of treatment today.  Return to clinic in 1 and 2 days for etoposide only, and then on Friday for Fulphilia.  Patient will return to clinic at the end of next week for laboratory work only and then in 3 weeks for further evaluation and consideration of cycle 3.  Cycle 3 will be  abbreviated secondary to the Christmas holiday.     Clinical stage IIB squamous cell carcinoma, left upper lobe lung:  Patient underwent biopsy with navigational bronchoscopy on October 19, 2018 confirming diagnosis.  Patient declined surgery and wished to pursue XRT along with concurrent chemotherapy.  Given his stage of disease he did not receive maintenance immunotherapy.  He completed cycle 7 of weekly carboplatinum and Taxol on January 09, 2019 and then completed XRT on January 14, 2019.   History of melanoma: Unclear stage or depth, although by report was in situ.  Patient had Mohs surgery in fall 2019.  Previous lung biopsy consistent with squamous cell carcinoma. Anxiety/depression: Continue current medications as prescribed.  Continue  follow-up with primary care physician as well as psychiatry at the [place]. Renal insufficiency: Resolved. Shortness of breath/cough: Patient states this is improved.  He recently declined Pleurx catheter.  Continue oxygen as prescribed. Pain: Continue oxycodone as needed. Fever/congestion: Patient was positive for COVID and completed a prescription of Paxlovid. Peripheral edema: Patient has been instructed to increase his torsemide to daily if his blood pressure remains above 110 systolic. Hyponatremia: Proceed treatment as above.  Recheck laboratory work on Friday and again next week.  Monitor.  Patient expressed understanding and was in agreement with this plan. He also understands that He can call clinic at any time with any questions, concerns, or complaints.    Cancer Staging  Small cell lung cancer, left [facility]) Staging form: Lung, AJCC 8th Edition - Clinical stage from 02/24/2023: Stage IVA (cT4, cN2, cM1a) - Signed by Jeralyn Ruths, MD on 02/24/2023  Squamous cell carcinoma lung, left (HCC) Staging form: Lung, AJCC 8th Edition - Clinical stage from 10/27/2018: Stage IIB (cT1c, cN1, cM0) - Signed by Jeralyn Ruths, MD on 11/16/2018   Jeralyn Ruths, MD   04/03/2023 3:19 PM

## 2023-04-03 NOTE — Patient Instructions (Signed)
[facility] - A DEPT OF [facility]  Discharge Instructions: Thank you for choosing [facility] to provide your oncology and hematology care.  If you have a lab appointment with the Cancer Center, please go directly to the Cancer Center and check in at the registration area.  Wear comfortable clothing and clothing appropriate for easy access to any Portacath or PICC line.   We strive to give you quality time with your provider. You may need to reschedule your appointment if you arrive late (15 or more minutes).  Arriving late affects you and other patients whose appointments are after yours.  Also, if you miss three or more appointments without notifying the office, you may be dismissed from the clinic at the provider's discretion.      For prescription refill requests, have your pharmacy contact our office and allow 72 hours for refills to be completed.    Today you received the following chemotherapy and/or immunotherapy agents atezolizumab, carboplatin, and etoposide      To help prevent nausea and vomiting after your treatment, we encourage you to take your nausea medication as directed.  BELOW ARE SYMPTOMS THAT SHOULD BE REPORTED IMMEDIATELY: *FEVER GREATER THAN 100.4 F (38 C) OR HIGHER *CHILLS OR SWEATING *NAUSEA AND VOMITING THAT IS NOT CONTROLLED WITH YOUR NAUSEA MEDICATION *UNUSUAL SHORTNESS OF BREATH *UNUSUAL BRUISING OR BLEEDING *URINARY PROBLEMS (pain or burning when urinating, or frequent urination) *BOWEL PROBLEMS (unusual diarrhea, constipation, pain near the anus) TENDERNESS IN MOUTH AND THROAT WITH OR WITHOUT PRESENCE OF ULCERS (sore throat, sores in mouth, or a toothache) UNUSUAL RASH, SWELLING OR PAIN  UNUSUAL VAGINAL DISCHARGE OR ITCHING   Items with * indicate a potential emergency and should be followed up as soon as possible or go to the Emergency Department if any problems should occur.  Please show the CHEMOTHERAPY  ALERT CARD or IMMUNOTHERAPY ALERT CARD at check-in to the Emergency Department and triage nurse.  Should you have questions after your visit or need to cancel or reschedule your appointment, please contact [facility] - A DEPT OF [facility]  [phone] and follow the prompts.  Office hours are 8:00 a.m. to 4:30 p.m. Monday - Friday. Please note that voicemails left after 4:00 p.m. may not be returned until the following business day.  We are closed weekends and major holidays. You have access to a nurse at all times for urgent questions. Please call the main number to the clinic [phone] and follow the prompts.  For any non-urgent questions, you may also contact your provider using MyChart. We now offer e-Visits for anyone 60 and older to request care online for non-urgent symptoms. For details visit [email or web address].   Also download the MyChart app! Go to the app store, search "MyChart", open the app, select [facility], and log in with your MyChart username and password.

## 2023-04-04 ENCOUNTER — Encounter: Payer: Self-pay | Admitting: Oncology

## 2023-04-04 ENCOUNTER — Inpatient Hospital Stay: Payer: Medicare Other

## 2023-04-04 VITALS — BP 136/61 | HR 65 | Temp 98.6°F | Resp 20

## 2023-04-04 DIAGNOSIS — C3492 Malignant neoplasm of unspecified part of left bronchus or lung: Secondary | ICD-10-CM

## 2023-04-04 DIAGNOSIS — Z5111 Encounter for antineoplastic chemotherapy: Secondary | ICD-10-CM | POA: Diagnosis not present

## 2023-04-04 MED ORDER — DEXAMETHASONE SODIUM PHOSPHATE 10 MG/ML IJ SOLN
10.0000 mg | Freq: Once | INTRAMUSCULAR | Status: AC
  Administered 2023-04-04: 10 mg via INTRAVENOUS
  Filled 2023-04-04: qty 1

## 2023-04-04 MED ORDER — SODIUM CHLORIDE 0.9 % IV SOLN
100.0000 mg/m2 | Freq: Once | INTRAVENOUS | Status: AC
  Administered 2023-04-04: 250 mg via INTRAVENOUS
  Filled 2023-04-04: qty 12.5

## 2023-04-04 MED ORDER — SODIUM CHLORIDE 0.9 % IV SOLN
INTRAVENOUS | Status: DC
  Filled 2023-04-04: qty 250

## 2023-04-04 MED ORDER — HEPARIN SOD (PORK) LOCK FLUSH 100 UNIT/ML IV SOLN
500.0000 [IU] | Freq: Once | INTRAVENOUS | Status: AC | PRN
  Administered 2023-04-04: 500 [IU]
  Filled 2023-04-04: qty 5

## 2023-04-05 ENCOUNTER — Inpatient Hospital Stay: Payer: Medicare Other

## 2023-04-05 VITALS — BP 130/58 | HR 91 | Temp 97.7°F | Resp 18

## 2023-04-05 DIAGNOSIS — C3492 Malignant neoplasm of unspecified part of left bronchus or lung: Secondary | ICD-10-CM

## 2023-04-05 DIAGNOSIS — Z5111 Encounter for antineoplastic chemotherapy: Secondary | ICD-10-CM | POA: Diagnosis not present

## 2023-04-05 MED ORDER — DEXAMETHASONE SODIUM PHOSPHATE 10 MG/ML IJ SOLN
10.0000 mg | Freq: Once | INTRAMUSCULAR | Status: AC
  Administered 2023-04-05: 10 mg via INTRAVENOUS
  Filled 2023-04-05: qty 1

## 2023-04-05 MED ORDER — HEPARIN SOD (PORK) LOCK FLUSH 100 UNIT/ML IV SOLN
500.0000 [IU] | Freq: Once | INTRAVENOUS | Status: AC | PRN
  Administered 2023-04-05: 500 [IU]
  Filled 2023-04-05: qty 5

## 2023-04-05 MED ORDER — SODIUM CHLORIDE 0.9 % IV SOLN
100.0000 mg/m2 | Freq: Once | INTRAVENOUS | Status: AC
  Administered 2023-04-05: 250 mg via INTRAVENOUS
  Filled 2023-04-05: qty 12.5

## 2023-04-05 MED ORDER — SODIUM CHLORIDE 0.9 % IV SOLN
INTRAVENOUS | Status: DC
  Filled 2023-04-05 (×2): qty 250

## 2023-04-05 NOTE — Patient Instructions (Signed)
[facility] - A DEPT OF [facility]  Discharge Instructions: Thank you for choosing [facility] to provide your oncology and hematology care.  If you have a lab appointment with the Cancer Center, please go directly to the Cancer Center and check in at the registration area.  Wear comfortable clothing and clothing appropriate for easy access to any Portacath or PICC line.   We strive to give you quality time with your provider. You may need to reschedule your appointment if you arrive late (15 or more minutes).  Arriving late affects you and other patients whose appointments are after yours.  Also, if you miss three or more appointments without notifying the office, you may be dismissed from the clinic at the provider's discretion.      For prescription refill requests, have your pharmacy contact our office and allow 72 hours for refills to be completed.    Today you received the following chemotherapy and/or immunotherapy agents ETOPOSIDE      To help prevent nausea and vomiting after your treatment, we encourage you to take your nausea medication as directed.  BELOW ARE SYMPTOMS THAT SHOULD BE REPORTED IMMEDIATELY: *FEVER GREATER THAN 100.4 F (38 C) OR HIGHER *CHILLS OR SWEATING *NAUSEA AND VOMITING THAT IS NOT CONTROLLED WITH YOUR NAUSEA MEDICATION *UNUSUAL SHORTNESS OF BREATH *UNUSUAL BRUISING OR BLEEDING *URINARY PROBLEMS (pain or burning when urinating, or frequent urination) *BOWEL PROBLEMS (unusual diarrhea, constipation, pain near the anus) TENDERNESS IN MOUTH AND THROAT WITH OR WITHOUT PRESENCE OF ULCERS (sore throat, sores in mouth, or a toothache) UNUSUAL RASH, SWELLING OR PAIN  UNUSUAL VAGINAL DISCHARGE OR ITCHING   Items with * indicate a potential emergency and should be followed up as soon as possible or go to the Emergency Department if any problems should occur.  Please show the CHEMOTHERAPY ALERT CARD or IMMUNOTHERAPY  ALERT CARD at check-in to the Emergency Department and triage nurse.  Should you have questions after your visit or need to cancel or reschedule your appointment, please contact [facility] - A DEPT OF [facility]  [phone] and follow the prompts.  Office hours are 8:00 a.m. to 4:30 p.m. Monday - Friday. Please note that voicemails left after 4:00 p.m. may not be returned until the following business day.  We are closed weekends and major holidays. You have access to a nurse at all times for urgent questions. Please call the main number to the clinic [phone] and follow the prompts.  For any non-urgent questions, you may also contact your provider using MyChart. We now offer e-Visits for anyone 78 and older to request care online for non-urgent symptoms. For details visit [email or web address].   Also download the MyChart app! Go to the app store, search "MyChart", open the app, select [place], and log in with your MyChart username and password.  Etoposide Capsules What is this medication? ETOPOSIDE (e toe POE side) treats lung cancer. It works by slowing down the growth of cancer cells. This medicine may be used for other purposes; ask your health care provider or pharmacist if you have questions. COMMON BRAND NAME(S): VePesid What should I tell my care team before I take this medication? They need to know if you have any of these conditions: Infection Kidney disease Liver disease Low blood counts, such as low white cell, platelet, red cell counts An unusual or allergic reaction to etoposide, other medications, foods, dyes,  or preservatives Pregnant or trying to get pregnant Breastfeeding How should I use this medication? Take this medication by mouth with a glass of water. Take it as directed on the prescription label. Do not cut, crush, or chew this medication. Swallow the capsules whole. Do not take it more often than directed. Keep taking it  unless your care team tells you to stop. Handling this medication may be harmful. Wear gloves while touching this medication or bottle. Talk to your care team about how to handle this medication. Special instructions may apply. Talk to your care team about the use of this medication in children. Special care may be needed. Overdosage: If you think you have taken too much of this medicine contact a poison control center or emergency room at once. NOTE: This medicine is only for you. Do not share this medicine with others. What if I miss a dose? If you miss a dose, take it as soon as you can. If it is almost time for your next dose, take only that dose. Do not take double or extra doses. What may interact with this medication? Cyclosporine Warfarin This list may not describe all possible interactions. Give your health care provider a list of all the medicines, herbs, non-prescription drugs, or dietary supplements you use. Also tell them if you smoke, drink alcohol, or use illegal drugs. Some items may interact with your medicine. What should I watch for while using this medication? Your condition will be monitored carefully while you are receiving this medication. This medication may make you feel generally unwell. This is not uncommon as chemotherapy can affect healthy cells as well as cancer cells. Report any side effects. Continue your course of treatment even though you feel ill unless your care team tells you to stop. This medication may increase your risk of getting an infection. Call your care team for advice if you get a fever, chills, sore throat, or other symptoms of a cold or flu. Do not treat yourself. Try to avoid being around people who are sick. This medication may increase your risk to bruise or bleed. Call your care team if you notice any unusual bleeding. Talk to your care team about your risk of cancer. You may be more at risk for certain types of cancers if you take this  medication. Talk to your care team if you may be pregnant. Serious birth defects can occur if you take this medication during pregnancy and for 6 months after the last dose. You will need a negative pregnancy test before starting this medication. Contraception is recommended while taking this medication and for 6 months after the last dose. Your care team can help you find the option that works for you. If your partner can get pregnant, use a condom during sex while taking this medication and for 4 months after the last dose. Do not breastfeed while taking this medication. This medication may cause infertility. Talk to your care team if you are concerned about your fertility. What side effects may I notice from receiving this medication? Side effects that you should report to your care team as soon as possible: Allergic reactions--skin rash, itching, hives, swelling of the face, lips, tongue, or throat Infection--fever, chills, cough, sore throat, wounds that don't heal, pain or trouble when passing urine, general feeling of discomfort or being unwell Low red blood cell level--unusual weakness or fatigue, dizziness, headache, trouble breathing Unusual bruising or bleeding Side effects that usually do not require medical attention (report to  your care team if they continue or are bothersome): Diarrhea Fatigue Hair loss Loss of appetite Nausea Pain, redness, or swelling with sores inside the mouth or throat Vomiting This list may not describe all possible side effects. Call your doctor for medical advice about side effects. You may report side effects to FDA at 1-800-FDA-1088. Where should I keep my medication? Keep out of the reach of children and pets. Store in a refrigerator between 2 and 8 degrees C (36 and 46 degrees F). Do not freeze. Get rid any unused medication after the expiration date. To get rid of medications that are no longer needed or have expired: Take the medication to a  medication take-back program. Check with your pharmacy or law enforcement to find a location. If you cannot return the medication, ask your pharmacist or care team how to get rid of this medication safely. NOTE: This sheet is a summary. It may not cover all possible information. If you have questions about this medicine, talk to your doctor, pharmacist, or health care provider.  2024 [employer]vier/Gold Standard (2021-09-09 00:00:00)

## 2023-04-07 ENCOUNTER — Inpatient Hospital Stay: Payer: Medicare Other

## 2023-04-07 DIAGNOSIS — Z5111 Encounter for antineoplastic chemotherapy: Secondary | ICD-10-CM | POA: Diagnosis not present

## 2023-04-07 DIAGNOSIS — C3492 Malignant neoplasm of unspecified part of left bronchus or lung: Secondary | ICD-10-CM

## 2023-04-07 LAB — COMPREHENSIVE METABOLIC PANEL
ALT: 14 U/L (ref 0–44)
AST: 18 U/L (ref 15–41)
Albumin: 3.1 g/dL — ABNORMAL LOW (ref 3.5–5.0)
Alkaline Phosphatase: 51 U/L (ref 38–126)
Anion gap: 9 (ref 5–15)
BUN: 29 mg/dL — ABNORMAL HIGH (ref 8–23)
CO2: 25 mmol/L (ref 22–32)
Calcium: 8.2 mg/dL — ABNORMAL LOW (ref 8.9–10.3)
Chloride: 92 mmol/L — ABNORMAL LOW (ref 98–111)
Creatinine, Ser: 1.17 mg/dL (ref 0.61–1.24)
GFR, Estimated: >60 mL/min (ref >60)
Glucose, Bld: 96 mg/dL (ref 70–99)
Potassium: 4.8 mmol/L (ref 3.5–5.1)
Sodium: 126 mmol/L — ABNORMAL LOW (ref 135–145)
Total Bilirubin: 1.2 mg/dL — ABNORMAL HIGH (ref <1.2)
Total Protein: 6 g/dL — ABNORMAL LOW (ref 6.5–8.1)

## 2023-04-07 MED ORDER — PEGFILGRASTIM INJECTION 6 MG/0.6ML ~~LOC~~
6.0000 mg | PREFILLED_SYRINGE | Freq: Once | SUBCUTANEOUS | Status: AC
Start: 2023-04-07 — End: 2023-04-07
  Administered 2023-04-07: 6 mg via SUBCUTANEOUS
  Filled 2023-04-07: qty 0.6

## 2023-04-12 ENCOUNTER — Other Ambulatory Visit: Payer: Self-pay | Admitting: *Deleted

## 2023-04-12 DIAGNOSIS — C3492 Malignant neoplasm of unspecified part of left bronchus or lung: Secondary | ICD-10-CM

## 2023-04-13 ENCOUNTER — Inpatient Hospital Stay: Payer: Medicare Other

## 2023-04-13 DIAGNOSIS — C3492 Malignant neoplasm of unspecified part of left bronchus or lung: Secondary | ICD-10-CM

## 2023-04-13 DIAGNOSIS — Z5111 Encounter for antineoplastic chemotherapy: Secondary | ICD-10-CM | POA: Diagnosis not present

## 2023-04-13 LAB — CMP (CANCER CENTER ONLY)
ALT: 12 U/L (ref 0–44)
AST: 13 U/L — ABNORMAL LOW (ref 15–41)
Albumin: 3.3 g/dL — ABNORMAL LOW (ref 3.5–5.0)
Alkaline Phosphatase: 67 U/L (ref 38–126)
Anion gap: 10 (ref 5–15)
BUN: 27 mg/dL — ABNORMAL HIGH (ref 8–23)
CO2: 26 mmol/L (ref 22–32)
Calcium: 8 mg/dL — ABNORMAL LOW (ref 8.9–10.3)
Chloride: 97 mmol/L — ABNORMAL LOW (ref 98–111)
Creatinine: 1.36 mg/dL — ABNORMAL HIGH (ref 0.61–1.24)
GFR, Estimated: 53 mL/min — ABNORMAL LOW (ref >60)
Glucose, Bld: 115 mg/dL — ABNORMAL HIGH (ref 70–99)
Potassium: 3.9 mmol/L (ref 3.5–5.1)
Sodium: 133 mmol/L — ABNORMAL LOW (ref 135–145)
Total Bilirubin: 1.2 mg/dL — ABNORMAL HIGH (ref <1.2)
Total Protein: 6.2 g/dL — ABNORMAL LOW (ref 6.5–8.1)

## 2023-04-13 NOTE — Progress Notes (Signed)
[facility] Healthcare Advance Directives Clinical Social Work  Patient presented to Advance Directives Clinic  to review and complete healthcare advance directives.  Clinical Social Worker met with patient and his wife, Levi [name].  The patient designated Levi [name] as their primary healthcare agent and Levi [name] as their secondary agent.  Patient also completed healthcare living will.    Patient's wife also completed her Advance Directives.  Levi [name] is the primary healthcare agent and Levi [name] is the secondary agent.  Documents were notarized and copies made for patient/family. Clinical Social Worker will send documents to medical records to be scanned into patient's chart. Clinical Social Worker encouraged patient/family to contact with any additional questions or concerns.   Dorothey Baseman, LCSW Clinical Social Worker [facility]

## 2023-04-17 ENCOUNTER — Encounter: Payer: Self-pay | Admitting: Oncology

## 2023-04-19 ENCOUNTER — Encounter: Payer: Self-pay | Admitting: Student in an Organized Health Care Education/Training Program

## 2023-04-19 ENCOUNTER — Ambulatory Visit: Payer: Medicare Other | Admitting: Student in an Organized Health Care Education/Training Program

## 2023-04-19 VITALS — BP 106/76 | HR 84 | Temp 97.8°F | Ht 72.0 in | Wt 255.2 lb

## 2023-04-19 DIAGNOSIS — C3492 Malignant neoplasm of unspecified part of left bronchus or lung: Secondary | ICD-10-CM

## 2023-04-19 DIAGNOSIS — J9611 Chronic respiratory failure with hypoxia: Secondary | ICD-10-CM | POA: Diagnosis not present

## 2023-04-19 DIAGNOSIS — J439 Emphysema, unspecified: Secondary | ICD-10-CM | POA: Diagnosis not present

## 2023-04-19 DIAGNOSIS — J910 Malignant pleural effusion: Secondary | ICD-10-CM

## 2023-04-19 NOTE — Progress Notes (Signed)
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 He is on oxygen therapy with improvement in his oxygenation.   Symptoms have been stable since his last visit, with stable appearing small left sided pleural effusion on ultrasound today suggesting response to chemotherapy. He has not had to undergo repeat thoracentesis since our last visit and we will defer indwelling pleural catheter placement for now.    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  He continues to use close and will continue with oxygen therapy until such time that his malignancy is under control.  -continue oxygen therapy, goal SpO2 88-92% -continue ICS/LABA/LAMA therapy -continue to monitor pleural effusion with POCUS, thoracentesis PRN  Return in about 3 months (around 07/18/2023).  I spent 32 minutes caring for this patient today, including preparing to see the patient, obtaining a medical history , reviewing a separately obtained history, performing a medically appropriate examination and/or evaluation, counseling and educating the patient/family/caregiver, ordering medications, tests, or procedures, and documenting clinical information in the electronic health record  [name], MD [facility]  End of visit medications:  No orders of the defined types were placed in this encounter.    Current Outpatient Medications:    albuterol (PROVENTIL HFA;VENTOLIN HFA) 108 (90 Base) MCG/ACT inhaler, Inhale 2  puffs into the lungs every 6 (six) hours as needed for wheezing or shortness of breath., Disp: 1 Inhaler, Rfl: 0   albuterol (PROVENTIL) (2.5 MG/3ML) 0.083% nebulizer solution, Take 2.5 mg by nebulization every 6 (six) hours as needed for wheezing or shortness of breath., Disp: , Rfl:    apixaban (ELIQUIS) 5 MG TABS tablet, Take 2.5 mg by mouth 2 (two) times daily., Disp: , Rfl:    carboxymethylcellulose (REFRESH PLUS) 0.5 % SOLN, 1 drop 4 (four) times daily as needed., Disp: , Rfl:    finasteride (PROSCAR) 5 MG tablet, Take 5 mg by mouth daily., Disp: , Rfl:    fluticasone-salmeterol (ADVAIR) 250-50 MCG/ACT AEPB, Inhale 1 puff into the lungs in the morning and at bedtime., Disp: , Rfl:    glimepiride (AMARYL) 2 MG tablet, Take 2 mg by mouth daily with breakfast., Disp: , Rfl:    levothyroxine (SYNTHROID) 200 MCG tablet, Take 200 mcg by mouth daily before breakfast., Disp: , Rfl:    lidocaine-prilocaine (EMLA) cream, Apply to affected area once, Disp: 30 g, Rfl: 3   ondansetron (ZOFRAN) 8 MG tablet, Take 1 tablet (8 mg total) by mouth every 8 (eight) hours as needed for nausea, vomiting or refractory nausea / vomiting. Start on the third day after carboplatin., Disp: 60 tablet, Rfl: 2   Oxycodone HCl 10 MG TABS, Take 1 tablet (10 mg total) by mouth every 6 (six) hours as needed. Okay to cut tablet in half if needed., Disp: 60 tablet, Rfl: 0   OXYGEN, Inhale 3 L into the lungs continuous., Disp: , Rfl:    prochlorperazine (COMPAZINE) 10 MG tablet, Take 1 tablet (10 mg total) by mouth every 6 (six) hours as needed for nausea or vomiting., Disp: 60 tablet, Rfl:  2   SEMAGLUTIDE,0.25 OR 0.5MG /DOS, [place], Inject 0.5 mg into the skin once a week., Disp: , Rfl:    tamsulosin (FLOMAX) 0.4 MG CAPS capsule, 0.8 mg daily., Disp: , Rfl:    Tiotropium Bromide Monohydrate (SPIRIVA RESPIMAT) 2.5 MCG/ACT AERS, Inhale 2 puffs into the lungs daily., Disp: 1 each, Rfl: 11   torsemide (DEMADEX) 20 MG tablet, TAKE 1 TABLET  BY MOUTH EVERY OTHER DAY, Disp: 45 tablet, Rfl: 2   vitamin B-12 (CYANOCOBALAMIN) 1000 MCG tablet, Take 1,000 mcg by mouth daily., Disp: , Rfl:  No current facility-administered medications for this visit.  Facility-Administered Medications Ordered in Other Visits:    0.9 %  sodium chloride infusion, , Intravenous, Continuous, Finnegan, Tollie Pizza, MD, Last Rate: 10 mL/hr at 04/05/23 1305, New Bag at 04/05/23 1305   Subjective:   PATIENT ID: Levi [name] GENDER: male DOB: [birth year], MRN: [id number]  Chief Complaint  Patient presents with   Follow-up    Continuous shortness of breath. Occasional cough. No wheezing.     HPI  Levi [name] is a pleasant 82 year old male presenting to clinic for follow up.  I was following Levi [name] for shortness of breath and respiratory failure. He carries a history of lung cancer treated with chemoradiation in the past. Surveillance imaging was noted for new pleural based mass and lymphadenopathy, biopsied via CT guided biopsy and showed a different primary tumor (small cell carcinoma). He has thoracentesis for drainage of pleural fluid with improvement in symptoms. Since his last visit, his symptoms have been stable to somewhat improved. He has not required a thoracentesis since we last met (total 4 thoracentesis performed so far). He has been undergoing chemotherapy and has tolerated it well so far. We had discussed placement of an IPC during his last visit and deferred at the time unless his effusion continued to re-accumulate with increased frequency, something that has not happened in the interim. He is maintained on triple therapy with LAMA/LABA/ICS and is currently on 2L of Oxygen via nasal cannula.  He is presenting today for follow-up with his wife.  Patient underwent restaging CT in October 2024 which returned with findings that are highly concerning for recurrence of his malignancy.  His chest CT is showed a left-sided pleural effusion, pleural  nodularity throughout the left hemithorax, a large bulky left hilar mass, enlarged mediastinal and hilar lymph nodes, and the new cavitary nodule in the right pulmonary apex.  Biopsy had shown small cell lung cancer and he was started on carboplatinum, etoposide, and atezolizumab under the care of Dr. Orlie Dakin.   He quit smoking around the time of his diagnosis with his initial lung cancer. At that time, he underwent navigational bronchoscopy with biopsies. He underwent radiation therapy to the LUL mass. He has a history of Afib for which he is followed by cardiology, he is on Eliquis.   He did smoke for a long time, with at least 50 to 60 pack years on him. 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 He is currently retired.   PFT's performed 04/22/2022 showed FEV1/FVC at 59% and FEV1 at 69% predicted, post bronchodilator FEV1 was 84% predicted.   Ancillary information including prior medications, full medical/surgical/family/social histories, and PFTs (when available) are listed below and have been reviewed.   Review of Systems  Constitutional:  Negative for chills and fever.  HENT:  Positive for hearing loss. Negative for ear pain and tinnitus.   Respiratory:  Positive for shortness of breath. Negative for cough, hemoptysis, sputum production and wheezing.   Cardiovascular:  Negative for chest pain.     Objective:   Vitals:   04/19/23 1105  BP: 106/76  Pulse: 84  Temp: 97.8 F (36.6 C)  TempSrc: Temporal  SpO2: 96%  Weight: 255 lb 3.2 oz (115.8 kg)  Height: 6' (1.829 m)   96% on 2 LPM BMI Readings from Last 3 Encounters:  04/19/23 34.61 kg/m  04/03/23 36.35 kg/m  04/03/23 36.35 kg/m   Wt Readings from Last 3 Encounters:  04/19/23 255 lb 3.2 oz (115.8 kg)  04/03/23 268 lb (121.6 kg)  04/03/23 268 lb (121.6 kg)     Physical Exam Constitutional:      Appearance: He is obese.  HENT:     Head: Normocephalic.     Nose: Nose normal.     Mouth/Throat:     Mouth: Mucous membranes are moist.  Eyes:     Extraocular Movements: Extraocular movements intact.  Cardiovascular:     Rate and Rhythm: Normal rate. Rhythm irregular.     Heart sounds: Normal heart sounds.  Pulmonary:     Effort: Pulmonary effort is normal.     Breath sounds: Rales (left lung base) present. No wheezing.     Comments: Distant sounds Neurological:     General: No focal deficit present.     Mental Status: He is alert and oriented to person, place, and time. Mental status is at baseline.     POCUS of the left lung base    Chest Point of Care Ultrasound  Indication: Pleural effusion  We performed ultrasound of Left Posterolateral hemithorax,for assessment of presence, volume and sonographic appearance of pleural fluid and sonographic apperance of lung in preparation for sampling of pleural fluid/insertion of catheter into pleural space.  Relevant sonographic findings:  Small amount of pleural fluid noted on isonation. Sonographic appearance of fluid was homogeneously anechoic. Visualized lung parenchyma was noted to be moving freely with respiration.  Impression:  Small pleural effusion that is simple in nature.  Further sonographic monitoring planned.   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HCC)    Prostate enlargement    Shortness of breath    Squamous cell carcinoma of lung, left (HCC)    Thyroid disease    Tobacco abuse      Family History  Problem Relation Age of Onset   Aneurysm Mother    Cancer Father      Past Surgical History:  Procedure Laterality Date   CARDIOVERSION N/A    CARDIOVERSION N/A  11/24/2021   Procedure: CARDIOVERSION;  Surgeon: Yvonne Kendall, MD;  Location: [facility];  Service: Cardiovascular;  Laterality: N/A;   ELECTROMAGNETIC NAVIGATION BROCHOSCOPY Left 10/19/2018   Procedure: ELECTROMAGNETIC NAVIGATION BRONCHOSCOPY LEFT;  Surgeon: Salena Saner, MD;  Location: [facility];  Service: Cardiopulmonary;  Laterality: Left;   ENDOBRONCHIAL ULTRASOUND Left 10/19/2018   Procedure: ENDOBRONCHIAL ULTRASOUND LEFT;  Surgeon: Salena Saner, MD;  Location: [facility];  Service: Cardiopulmonary;  Laterality: Left;   IR IMAGING GUIDED PORT INSERTION  03/03/2023  IR THORACENTESIS ASP PLEURAL SPACE W/IMG GUIDE  03/01/2023   MELANOMA EXCISION Left    NO PAST SURGERIES      Social History   Socioeconomic History   Marital status: Married    Spouse name: Olegario Messier   Number of children: 5   Years of education: Not on file   Highest education level: Not on file  Occupational History   Not on file  Tobacco Use   Smoking status: Former    Current packs/day: 0.00    Average packs/day: 0.3 packs/day for 62.0 years (15.5 ttl pk-yrs)    Types: Cigarettes    Start date: 09/04/1956    Quit date: 09/05/2018    Years since quitting: 4.6   Smokeless tobacco: Never  Vaping Use   Vaping status: Never Used  Substance and Sexual Activity   Alcohol use: No    Alcohol/week: 0.0 standard drinks of alcohol   Drug use: No   Sexual activity: Not on file  Other Topics Concern   Not on file  Social History Narrative   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 He is married to his wife of 30+ years, Olegario Messier and has 5 children (1 deceased), many grand children, and one great grand child.    Social Drivers of Corporate investment banker Strain: Low Risk  (12/04/2018)   Overall Financial Resource Strain (CARDIA)    Difficulty of  Paying Living Expenses: Not very hard  Food Insecurity: No Food Insecurity (12/04/2018)   Hunger Vital Sign    Worried About Running Out of Food in the Last Year: Never true    Ran Out of Food in the Last Year: Never true  Transportation Needs: No Transportation Needs (12/04/2018)   PRAPARE - Administrator, Civil Service (Medical): No    Lack of Transportation (Non-Medical): No  Physical Activity: Not on file  Stress: Stress Concern Present (12/04/2018)   [employer] of Occupational Health - Occupational Stress Questionnaire    Feeling of Stress : Very much  Social Connections: Unknown (12/04/2018)   Social Connection and Isolation Panel [NHANES]    Frequency of Communication with Friends and Family: More than three times a week    Frequency of Social Gatherings with Friends and Family: Once a week    Attends Religious Services: Not on Insurance claims handler of Clubs or Organizations: Not on file    Attends Banker Meetings: Not on file    Marital Status: Not on file  Intimate Partner Violence: Not on file     No Known Allergies   CBC    Component Value Date/Time   WBC 9.0 04/03/2023 0829   WBC 6.4 02/27/2023 0838   RBC 4.41 04/03/2023 0829   HGB 13.3 04/03/2023 0829   HCT 39.5 04/03/2023 0829   PLT 366 04/03/2023 0829   MCV 89.6 04/03/2023 0829   MCV 90.0 10/20/2015 1020   MCH 30.2 04/03/2023 0829   MCHC 33.7 04/03/2023 0829   RDW 13.5 04/03/2023 0829   LYMPHSABS 0.6 (L) 04/03/2023 0829   MONOABS 1.2 (H) 04/03/2023 0829   EOSABS 0.0 04/03/2023 0829   BASOSABS 0.0 04/03/2023 0829    Pulmonary Functions Testing Results:    Latest Ref Rng & Units 04/22/2022   12:02 PM  PFT Results  FVC-Pre  L 3.75   FVC-Predicted Pre % 85   FVC-Post L 4.15   FVC-Predicted Post % 93   Pre FEV1/FVC % % 59   Post FEV1/FCV % % 64   FEV1-Pre L 2.20   FEV1-Predicted Pre % 69   FEV1-Post L 2.66   DLCO uncorrected ml/min/mmHg 11.02   DLCO UNC% % 42   DLVA  Predicted % 47   TLC L 6.33   TLC % Predicted % 84   RV % Predicted % 78     Outpatient Medications Prior to Visit  Medication Sig Dispense Refill   albuterol (PROVENTIL HFA;VENTOLIN HFA) 108 (90 Base) MCG/ACT inhaler Inhale 2 puffs into the lungs every 6 (six) hours as needed for wheezing or shortness of breath. 1 Inhaler 0   albuterol (PROVENTIL) (2.5 MG/3ML) 0.083% nebulizer solution Take 2.5 mg by nebulization every 6 (six) hours as needed for wheezing or shortness of breath.     apixaban (ELIQUIS) 5 MG TABS tablet Take 2.5 mg by mouth 2 (two) times daily.     carboxymethylcellulose (REFRESH PLUS) 0.5 % SOLN 1 drop 4 (four) times daily as needed.     finasteride (PROSCAR) 5 MG tablet Take 5 mg by mouth daily.     fluticasone-salmeterol (ADVAIR) 250-50 MCG/ACT AEPB Inhale 1 puff into the lungs in the morning and at bedtime.     glimepiride (AMARYL) 2 MG tablet Take 2 mg by mouth daily with breakfast.     levothyroxine (SYNTHROID) 200 MCG tablet Take 200 mcg by mouth daily before breakfast.     lidocaine-prilocaine (EMLA) cream Apply to affected area once 30 g 3   ondansetron (ZOFRAN) 8 MG tablet Take 1 tablet (8 mg total) by mouth every 8 (eight) hours as needed for nausea, vomiting or refractory nausea / vomiting. Start on the third day after carboplatin. 60 tablet 2   Oxycodone HCl 10 MG TABS Take 1 tablet (10 mg total) by mouth every 6 (six) hours as needed. Okay to cut tablet in half if needed. 60 tablet 0   OXYGEN Inhale 3 L into the lungs continuous.     prochlorperazine (COMPAZINE) 10 MG tablet Take 1 tablet (10 mg total) by mouth every 6 (six) hours as needed for nausea or vomiting. 60 tablet 2   SEMAGLUTIDE,0.25 OR 0.5MG /DOS, [place] Inject 0.5 mg into the skin once a week.     tamsulosin (FLOMAX) 0.4 MG CAPS capsule 0.8 mg daily.     Tiotropium Bromide Monohydrate (SPIRIVA RESPIMAT) 2.5 MCG/ACT AERS Inhale 2 puffs into the lungs daily. 1 each 11   torsemide (DEMADEX) 20 MG tablet TAKE  1 TABLET BY MOUTH EVERY OTHER DAY 45 tablet 2   vitamin B-12 (CYANOCOBALAMIN) 1000 MCG tablet Take 1,000 mcg by mouth daily.     Facility-Administered Medications Prior to Visit  Medication Dose Route Frequency Provider Last Rate Last Admin   0.9 %  sodium chloride infusion   Intravenous Continuous Jeralyn Ruths, MD 10 mL/hr at 04/05/23 1305 New Bag at 04/05/23 1305

## 2023-04-21 MED FILL — Fosaprepitant Dimeglumine For IV Infusion 150 MG (Base Eq): INTRAVENOUS | Qty: 5 | Status: AC

## 2023-04-24 ENCOUNTER — Inpatient Hospital Stay (HOSPITAL_BASED_OUTPATIENT_CLINIC_OR_DEPARTMENT_OTHER): Payer: Medicare Other | Admitting: Oncology

## 2023-04-24 ENCOUNTER — Inpatient Hospital Stay: Payer: Medicare Other

## 2023-04-24 ENCOUNTER — Encounter: Payer: Self-pay | Admitting: Oncology

## 2023-04-24 ENCOUNTER — Inpatient Hospital Stay (HOSPITAL_BASED_OUTPATIENT_CLINIC_OR_DEPARTMENT_OTHER): Payer: Medicare Other | Admitting: Hospice and Palliative Medicine

## 2023-04-24 VITALS — BP 114/72 | HR 73 | Temp 98.7°F | Resp 20 | Wt 253.1 lb

## 2023-04-24 DIAGNOSIS — Z5111 Encounter for antineoplastic chemotherapy: Secondary | ICD-10-CM

## 2023-04-24 DIAGNOSIS — E876 Hypokalemia: Secondary | ICD-10-CM | POA: Diagnosis not present

## 2023-04-24 DIAGNOSIS — Z5112 Encounter for antineoplastic immunotherapy: Secondary | ICD-10-CM | POA: Diagnosis not present

## 2023-04-24 DIAGNOSIS — C3492 Malignant neoplasm of unspecified part of left bronchus or lung: Secondary | ICD-10-CM

## 2023-04-24 LAB — CBC WITH DIFFERENTIAL (CANCER CENTER ONLY)
Abs Immature Granulocytes: 0.5 10*3/uL — ABNORMAL HIGH (ref 0.00–0.07)
Basophils Absolute: 0.1 10*3/uL (ref 0.0–0.1)
Basophils Relative: 1 %
Eosinophils Absolute: 0 10*3/uL (ref 0.0–0.5)
Eosinophils Relative: 0 %
HCT: 36.1 % — ABNORMAL LOW (ref 39.0–52.0)
Hemoglobin: 11.8 g/dL — ABNORMAL LOW (ref 13.0–17.0)
Immature Granulocytes: 5 %
Lymphocytes Relative: 10 %
Lymphs Abs: 1.1 10*3/uL (ref 0.7–4.0)
MCH: 30.3 pg (ref 26.0–34.0)
MCHC: 32.7 g/dL (ref 30.0–36.0)
MCV: 92.6 fL (ref 80.0–100.0)
Monocytes Absolute: 1.3 10*3/uL — ABNORMAL HIGH (ref 0.1–1.0)
Monocytes Relative: 12 %
Neutro Abs: 8.1 10*3/uL — ABNORMAL HIGH (ref 1.7–7.7)
Neutrophils Relative %: 72 %
Platelet Count: 321 10*3/uL (ref 150–400)
RBC: 3.9 MIL/uL — ABNORMAL LOW (ref 4.22–5.81)
RDW: 14.4 % (ref 11.5–15.5)
WBC Count: 11.2 10*3/uL — ABNORMAL HIGH (ref 4.0–10.5)
nRBC: 0 % (ref 0.0–0.2)

## 2023-04-24 LAB — CMP (CANCER CENTER ONLY)
ALT: 13 U/L (ref 0–44)
AST: 20 U/L (ref 15–41)
Albumin: 3.2 g/dL — ABNORMAL LOW (ref 3.5–5.0)
Alkaline Phosphatase: 61 U/L (ref 38–126)
Anion gap: 13 (ref 5–15)
BUN: 20 mg/dL (ref 8–23)
CO2: 24 mmol/L (ref 22–32)
Calcium: 8 mg/dL — ABNORMAL LOW (ref 8.9–10.3)
Chloride: 96 mmol/L — ABNORMAL LOW (ref 98–111)
Creatinine: 1.52 mg/dL — ABNORMAL HIGH (ref 0.61–1.24)
GFR, Estimated: 46 mL/min — ABNORMAL LOW (ref >60)
Glucose, Bld: 116 mg/dL — ABNORMAL HIGH (ref 70–99)
Potassium: 2.9 mmol/L — ABNORMAL LOW (ref 3.5–5.1)
Sodium: 133 mmol/L — ABNORMAL LOW (ref 135–145)
Total Bilirubin: 0.5 mg/dL (ref <1.2)
Total Protein: 6.3 g/dL — ABNORMAL LOW (ref 6.5–8.1)

## 2023-04-24 MED ORDER — CARBOPLATIN CHEMO INJECTION 600 MG/60ML
459.5000 mg | Freq: Once | INTRAVENOUS | Status: AC
  Administered 2023-04-24: 460 mg via INTRAVENOUS
  Filled 2023-04-24: qty 46

## 2023-04-24 MED ORDER — SODIUM CHLORIDE 0.9 % IV SOLN
1200.0000 mg | Freq: Once | INTRAVENOUS | Status: AC
  Administered 2023-04-24: 1200 mg via INTRAVENOUS
  Filled 2023-04-24: qty 20

## 2023-04-24 MED ORDER — DEXAMETHASONE SODIUM PHOSPHATE 10 MG/ML IJ SOLN
10.0000 mg | Freq: Once | INTRAMUSCULAR | Status: AC
  Administered 2023-04-24: 10 mg via INTRAVENOUS
  Filled 2023-04-24: qty 1

## 2023-04-24 MED ORDER — POTASSIUM CHLORIDE CRYS ER 20 MEQ PO TBCR: 20.0000 meq | EXTENDED_RELEASE_TABLET | Freq: Every day | ORAL | 0 refills | Status: DC

## 2023-04-24 MED ORDER — ETOPOSIDE CHEMO INJECTION 1 GM/50ML
100.0000 mg/m2 | Freq: Once | INTRAVENOUS | Status: AC
  Administered 2023-04-24: 250 mg via INTRAVENOUS
  Filled 2023-04-24: qty 12.5

## 2023-04-24 MED ORDER — PALONOSETRON HCL INJECTION 0.25 MG/5ML
0.2500 mg | Freq: Once | INTRAVENOUS | Status: AC
Start: 2023-04-24 — End: 2023-04-24
  Administered 2023-04-24: 0.25 mg via INTRAVENOUS
  Filled 2023-04-24: qty 5

## 2023-04-24 MED ORDER — POTASSIUM CHLORIDE 20 MEQ/100ML IV SOLN
20.0000 meq | Freq: Once | INTRAVENOUS | Status: AC
Start: 2023-04-24 — End: 2023-04-24
  Administered 2023-04-24: 20 meq via INTRAVENOUS

## 2023-04-24 MED ORDER — SODIUM CHLORIDE 0.9 % IV SOLN
INTRAVENOUS | Status: DC
  Filled 2023-04-24: qty 250

## 2023-04-24 MED ORDER — HEPARIN SOD (PORK) LOCK FLUSH 100 UNIT/ML IV SOLN
500.0000 [IU] | Freq: Once | INTRAVENOUS | Status: AC | PRN
  Administered 2023-04-24: 500 [IU]
  Filled 2023-04-24: qty 5

## 2023-04-24 MED ORDER — SODIUM CHLORIDE 0.9 % IV SOLN
150.0000 mg | Freq: Once | INTRAVENOUS | Status: AC
  Administered 2023-04-24: 150 mg via INTRAVENOUS
  Filled 2023-04-24: qty 150

## 2023-04-24 NOTE — Progress Notes (Signed)
Pt here for follow up. Pt reports that blood pressure has been lower than usual.

## 2023-04-24 NOTE — Addendum Note (Signed)
Addended by: Malachy Moan on: 04/24/2023 01:48 PM   Modules accepted: Orders

## 2023-04-24 NOTE — Progress Notes (Addendum)
Palliative Medicine [facility] [phone] [phone]   Name: Levi [name] Date: 04/24/2023 MRN: [id number]  DOB: 12-28-1940  [facility] - General (General Practice) Debbe Odea, MD as PCP - Cardiology (Cardiology) Lanier Prude, MD as PCP - Electrophysiology (Cardiology) Glory Buff, RN as Registered Nurse Orlie Dakin, Tollie Pizza, MD as Consulting Physician (Oncology) Carmina Miller, MD as Referring Physician (Radiation Oncology) Hulda Marin, MD (Inactive) as Referring Physician (Cardiothoracic Surgery)    REASON FOR CONSULTATION: Levi [name] is a 82 y.o. 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  Patient was referred to palliative care to address goals manage ongoing symptoms.  SOCIAL HISTORY:     reports that he quit smoking about 4 years ago. His smoking use included cigarettes. He started smoking about 66 years ago. He has a 15.5 pack-year smoking history. He has never used smokeless tobacco. He reports that he does not drink alcohol and does not use drugs.  Patient is married and lives at home with his wife.  He has three adult children who live locally and one in [place].  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  ADVANCE DIRECTIVES:  Not on file  CODE STATUS:   PAST MEDICAL HISTORY: Past Medical History:  Diagnosis Date   Asthma    COPD (chronic obstructive pulmonary disease) (HCC)    Hearing loss    Hypertension    Hypothyroidism    Mass of left lung    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HCC)    Prostate enlargement    Shortness of breath    Squamous cell carcinoma of  lung, left (HCC)    Thyroid disease    Tobacco abuse     PAST SURGICAL HISTORY:  Past Surgical History:  Procedure Laterality Date   CARDIOVERSION N/A    CARDIOVERSION N/A 11/24/2021   Procedure: CARDIOVERSION;  Surgeon: Yvonne Kendall, MD;  Location: [facility] ORS;  Service: Cardiovascular;  Laterality: N/A;   ELECTROMAGNETIC NAVIGATION BROCHOSCOPY Left 10/19/2018   Procedure: ELECTROMAGNETIC NAVIGATION BRONCHOSCOPY LEFT;  Surgeon: Salena Saner, MD;  Location: [facility] ORS;  Service: Cardiopulmonary;  Laterality: Left;   ENDOBRONCHIAL ULTRASOUND Left 10/19/2018   Procedure: ENDOBRONCHIAL ULTRASOUND LEFT;  Surgeon: Salena Saner, MD;  Location: [facility] ORS;  Service: Cardiopulmonary;  Laterality: Left;   IR IMAGING GUIDED PORT INSERTION  03/03/2023   IR THORACENTESIS ASP PLEURAL SPACE W/IMG GUIDE  03/01/2023   MELANOMA EXCISION Left    NO PAST SURGERIES      HEMATOLOGY/ONCOLOGY HISTORY:  Oncology History  Squamous cell carcinoma lung, left (HCC)  10/27/2018 Initial Diagnosis   Squamous cell carcinoma lung, left (HCC)   10/27/2018 Cancer Staging   Staging form: Lung, AJCC 8th Edition - Clinical stage from 10/27/2018: Stage IIB (cT1c, cN1, cM0) - Signed by Jeralyn Ruths, MD on 11/16/2018   11/27/2018 - 01/09/2019 Chemotherapy   Patient is on Treatment Plan : LUNG Carboplatin / Paclitaxel + XRT q7d     Small cell lung cancer, left (HCC)  02/24/2023 Initial Diagnosis   Small cell lung cancer, left (HCC)   02/24/2023 Cancer Staging   Staging form: Lung, AJCC 8th Edition - Clinical stage from 02/24/2023:  Stage IVA (cT4, cN2, cM1a) - Signed by Jeralyn Ruths, MD on 02/24/2023   03/06/2023 -  Chemotherapy   Patient is on Treatment Plan : LUNG SCLC Carboplatin + Etoposide + Atezolizumab Induction q21d x 4 cycles / Atezolizumab Maintenance q21d       ALLERGIES:  has no known allergies.  MEDICATIONS:  Current Outpatient Medications  Medication Sig Dispense Refill    albuterol (PROVENTIL HFA;VENTOLIN HFA) 108 (90 Base) MCG/ACT inhaler Inhale 2 puffs into the lungs every 6 (six) hours as needed for wheezing or shortness of breath. 1 Inhaler 0   albuterol (PROVENTIL) (2.5 MG/3ML) 0.083% nebulizer solution Take 2.5 mg by nebulization every 6 (six) hours as needed for wheezing or shortness of breath.     apixaban (ELIQUIS) 5 MG TABS tablet Take 2.5 mg by mouth 2 (two) times daily.     carboxymethylcellulose (REFRESH PLUS) 0.5 % SOLN 1 drop 4 (four) times daily as needed.     finasteride (PROSCAR) 5 MG tablet Take 5 mg by mouth daily.     fluticasone-salmeterol (ADVAIR) 250-50 MCG/ACT AEPB Inhale 1 puff into the lungs in the morning and at bedtime.     glimepiride (AMARYL) 2 MG tablet Take 2 mg by mouth daily with breakfast.     levothyroxine (SYNTHROID) 200 MCG tablet Take 200 mcg by mouth daily before breakfast.     lidocaine-prilocaine (EMLA) cream Apply to affected area once 30 g 3   ondansetron (ZOFRAN) 8 MG tablet Take 1 tablet (8 mg total) by mouth every 8 (eight) hours as needed for nausea, vomiting or refractory nausea / vomiting. Start on the third day after carboplatin. 60 tablet 2   Oxycodone HCl 10 MG TABS Take 1 tablet (10 mg total) by mouth every 6 (six) hours as needed. Okay to cut tablet in half if needed. 60 tablet 0   OXYGEN Inhale 3 L into the lungs continuous.     potassium chloride SA (KLOR-CON M) 20 MEQ tablet Take 1 tablet (20 mEq total) by mouth daily. 14 tablet 0   prochlorperazine (COMPAZINE) 10 MG tablet Take 1 tablet (10 mg total) by mouth every 6 (six) hours as needed for nausea or vomiting. (Patient not taking: Reported on 04/24/2023) 60 tablet 2   SEMAGLUTIDE,0.25 OR 0.5MG /DOS, [place] Inject 0.5 mg into the skin once a week.     tamsulosin (FLOMAX) 0.4 MG CAPS capsule 0.8 mg daily.     Tiotropium Bromide Monohydrate (SPIRIVA RESPIMAT) 2.5 MCG/ACT AERS Inhale 2 puffs into the lungs daily. 1 each 11   torsemide (DEMADEX) 20 MG tablet TAKE 1  TABLET BY MOUTH EVERY OTHER DAY 45 tablet 2   vitamin B-12 (CYANOCOBALAMIN) 1000 MCG tablet Take 1,000 mcg by mouth daily.     No current facility-administered medications for this visit.   Facility-Administered Medications Ordered in Other Visits  Medication Dose Route Frequency Provider Last Rate Last Admin   0.9 %  sodium chloride infusion   Intravenous Continuous Jeralyn Ruths, MD 10 mL/hr at 04/05/23 1305 New Bag at 04/05/23 1305   0.9 %  sodium chloride infusion   Intravenous Continuous Jeralyn Ruths, MD 10 mL/hr at 04/24/23 1004 New Bag at 04/24/23 1004   atezolizumab (TECENTRIQ) 1,200 mg in sodium chloride 0.9 % 250 mL chemo infusion  1,200 mg Intravenous Once Jeralyn Ruths, MD       CARBOplatin (PARAPLATIN) 460 mg in sodium chloride 0.9 % 250 mL chemo infusion  460 mg Intravenous Once [place],  Tollie Pizza, MD       dexamethasone (DECADRON) injection 10 mg  10 mg Intravenous Once Jeralyn Ruths, MD       etoposide (VEPESID) 250 mg in sodium chloride 0.9 % 1,000 mL chemo infusion  100 mg/m2 (Treatment Plan Recorded) Intravenous Once Jeralyn Ruths, MD       heparin lock flush 100 unit/mL  500 Units Intracatheter Once PRN Jeralyn Ruths, MD       potassium chloride 20 mEq in 100 mL IVPB  20 mEq Intravenous Once Creig Hines, MD        VITAL SIGNS: There were no vitals taken for this visit. There were no vitals filed for this visit.  Estimated body mass index is 34.33 kg/m as calculated from the following:   Height as of 04/19/23: 6' (1.829 m).   Weight as of an earlier encounter on 04/24/23: 253 lb 1.6 oz (114.8 kg).  LABS: CBC:    Component Value Date/Time   WBC 11.2 (H) 04/24/2023 0851   WBC 6.4 02/27/2023 0838   HGB 11.8 (L) 04/24/2023 0851   HCT 36.1 (L) 04/24/2023 0851   PLT 321 04/24/2023 0851   MCV 92.6 04/24/2023 0851   MCV 90.0 10/20/2015 1020   NEUTROABS 8.1 (H) 04/24/2023 0851   LYMPHSABS 1.1 04/24/2023 0851   MONOABS 1.3 (H)  04/24/2023 0851   EOSABS 0.0 04/24/2023 0851   BASOSABS 0.1 04/24/2023 0851   Comprehensive Metabolic Panel:    Component Value Date/Time   NA 133 (L) 04/24/2023 0851   K 2.9 (L) 04/24/2023 0851   CL 96 (L) 04/24/2023 0851   CO2 24 04/24/2023 0851   BUN 20 04/24/2023 0851   CREATININE 1.52 (H) 04/24/2023 0851   CREATININE 1.29 10/31/2014 1159   GLUCOSE 116 (H) 04/24/2023 0851   CALCIUM 8.0 (L) 04/24/2023 0851   AST 20 04/24/2023 0851   ALT 13 04/24/2023 0851   ALKPHOS 61 04/24/2023 0851   BILITOT 0.5 04/24/2023 0851   PROT 6.3 (L) 04/24/2023 0851   ALBUMIN 3.2 (L) 04/24/2023 0851    RADIOGRAPHIC STUDIES: No results found.  PERFORMANCE STATUS (ECOG) : 3 - Symptomatic, >50% confined to bed  Review of Systems Unless otherwise noted, a complete review of systems is negative.  Physical Exam General: NAD Pulmonary: Unlabored, on O2 Extremities: no edema, no joint deformities Skin: no rashes Neurological: Weakness but otherwise nonfocal  IMPRESSION: Patient seen in infusion.  Accompanied by his wife.  Patient reports he is doing reasonably well.  Denies significant changes or concerns today.  Has had poor appetite with weight loss.  Will refer to dietitian.  Wife reports that patient has been emotional lately.  [facility] but did not find that to be particularly helpful.  He is not interested in referral to [facility] or social work at this time.   I completed a MOST form today. The patient and family outlined their wishes for the following treatment decisions:  Cardiopulmonary Resuscitation: Do Not Attempt Resuscitation (DNR/No CPR)  Medical Interventions: Limited Additional Interventions: Use medical treatment, IV fluids and cardiac monitoring as indicated, DO NOT USE intubation or mechanical ventilation. May consider use of less invasive airway support such as BiPAP or CPAP. Also provide comfort measures. Transfer to the hospital if indicated. Avoid  intensive care.   Antibiotics: Antibiotics if indicated  IV Fluids: IV fluids if indicated  Feeding Tube: No feeding tube   Discussion with patient and wife regarding  CODE STATUS.  Patient states that he would not want to be intubated or have his life prolonged artificial machines.  He is in agreement with DNR/DNI.  MOST form completed to outlined his wishes.  PLAN: -Continue current scope of treatment -MOST Form completed -DNR/DNI -Referral to dietitian -RTC 3 weeks  Case and plan discussed with Dr. Orlie Dakin  Patient expressed understanding and was in agreement with this plan. He also understands that He can call the clinic at any time with any questions, concerns, or complaints.     Time Total: 20 minutes  Visit consisted of counseling and education dealing with the complex and emotionally intense issues of symptom management and palliative care in the setting of serious and potentially life-threatening illness.Greater than 50%  of this time was spent counseling and coordinating care related to the above assessment and plan.  Signed by: Laurette Schimke, PhD, NP-C

## 2023-04-24 NOTE — Addendum Note (Signed)
Addended by: Owens Shark C on: 04/24/2023 10:32 AM   Modules accepted: Orders

## 2023-04-24 NOTE — Progress Notes (Addendum)
Hematology/Oncology Consult note [facility]  Telephone:([phone] [phone]  [facility]: [facility] - General (General Practice) Debbe Odea, MD as PCP - Cardiology (Cardiology) Lanier Prude, MD as PCP - Electrophysiology (Cardiology) Glory Buff, RN as Registered Nurse Orlie Dakin, Tollie Pizza, MD as Consulting Physician (Oncology) Carmina Miller, MD as Referring Physician (Radiation Oncology) Hulda Marin, MD (Inactive) as Referring Physician (Cardiothoracic Surgery)   Name of the patient: Levi [name]  [id number]  11/11/1940   Date of visit: 04/24/23  Diagnosis-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  He declined surgery and received concurrent chemo radiation.  He did not receive maintenance immunotherapy.  History of melanoma status post Mohs surgery in 2019.He was noted to have extensive metastatic disease throughout the left hemithorax with multiple hypermetabolic pleural-based nodules, moderate-sized left pleural effusion large left hilar mass and hypermetabolic mediastinal lymph node metastases in October 2024.  MRI brain was negative for metastatic disease.  Pleural biopsy was consistent with small cell carcinoma.  He started [place] etoposide Tecentriq chemotherapy in November 2024.  Interval history-he is doing well overall and tolerating chemotherapy well without any significant side effects.  He had some self-limited nausea.  Appetite is fair and weight has remainedStable.  He has baseline fatigue and exertional shortness of breath  ECOG PS- 2 Pain scale- 0 Opioid associated constipation- no  Review of systems- Review of Systems   Constitutional:  Positive for malaise/fatigue. Negative for chills, fever and weight loss.  HENT:  Negative for congestion, ear discharge and nosebleeds.   Eyes:  Negative for blurred vision.  Respiratory:  Positive for shortness of breath. Negative for cough, hemoptysis, sputum production and wheezing.   Cardiovascular:  Negative for chest pain, palpitations, orthopnea and claudication.  Gastrointestinal:  Negative for abdominal pain, blood in stool, constipation, diarrhea, heartburn, melena, nausea and vomiting.  Genitourinary:  Negative for dysuria, flank pain, frequency, hematuria and urgency.  Musculoskeletal:  Negative for back pain, joint pain and myalgias.  Skin:  Negative for rash.  Neurological:  Negative for dizziness, tingling, focal weakness, seizures, weakness and headaches.  Endo/Heme/Allergies:  Does not bruise/bleed easily.  Psychiatric/Behavioral:  Negative for depression and suicidal ideas. The patient does not have insomnia.       No Known Allergies   Past Medical History:  Diagnosis Date   Asthma    COPD (chronic obstructive pulmonary disease) (HCC)    Hearing loss    Hypertension    Hypothyroidism    Mass of left lung    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HCC)    Prostate enlargement    Shortness of breath    Squamous cell carcinoma of lung, left (HCC)    Thyroid disease    Tobacco abuse      Past Surgical History:  Procedure Laterality Date   CARDIOVERSION N/A    CARDIOVERSION N/A 11/24/2021   Procedure: CARDIOVERSION;  Surgeon: Yvonne Kendall, MD;  Location: [facility] ORS;  Service: Cardiovascular;  Laterality: N/A;   ELECTROMAGNETIC NAVIGATION BROCHOSCOPY Left 10/19/2018   Procedure: ELECTROMAGNETIC NAVIGATION BRONCHOSCOPY LEFT;  Surgeon: Salena Saner, MD;  Location: [facility] ORS;  Service: Cardiopulmonary;  Laterality: Left;   ENDOBRONCHIAL ULTRASOUND Left 10/19/2018   Procedure: ENDOBRONCHIAL ULTRASOUND  LEFT;  Surgeon: Salena Saner, MD;  Location:  [facility] ORS;  Service: Cardiopulmonary;  Laterality: Left;   IR IMAGING GUIDED PORT INSERTION  03/03/2023   IR THORACENTESIS ASP PLEURAL SPACE W/IMG GUIDE  03/01/2023   MELANOMA EXCISION Left    NO PAST SURGERIES      Social History   Socioeconomic History   Marital status: Married    Spouse name: Olegario Messier   Number of children: 5   Years of education: Not on file   Highest education level: Not on file  Occupational History   Not on file  Tobacco Use   Smoking status: Former    Current packs/day: 0.00    Average packs/day: 0.3 packs/day for 62.0 years (15.5 ttl pk-yrs)    Types: Cigarettes    Start date: 09/04/1956    Quit date: 09/05/2018    Years since quitting: 4.6   Smokeless tobacco: Never  Vaping Use   Vaping status: Never Used  Substance and Sexual Activity   Alcohol use: No    Alcohol/week: 0.0 standard drinks of alcohol   Drug use: No   Sexual activity: Not on file  Other Topics Concern   Not on file  Social History Narrative   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 He is married to his wife of 30+ years, Olegario Messier and has 5 children (1 deceased), many grand children, and one great grand child.    Social Drivers of Corporate investment banker Strain: Low Risk  (12/04/2018)   Overall Financial Resource Strain (CARDIA)    Difficulty of Paying Living Expenses: Not very hard  Food Insecurity: No Food Insecurity (12/04/2018)   Hunger Vital Sign    Worried About Running Out of Food in the Last Year: Never true    Ran Out of Food in the Last Year: Never true  Transportation Needs: No Transportation Needs (12/04/2018)   PRAPARE - Administrator, Civil Service (Medical): No    Lack of Transportation (Non-Medical): No  Physical Activity: Not on file  Stress: Stress Concern Present (12/04/2018)   [employer]  of Occupational Health - Occupational Stress Questionnaire    Feeling of Stress : Very much  Social Connections: Unknown (12/04/2018)   Social Connection and Isolation Panel [NHANES]    Frequency of Communication with Friends and Family: More than three times a week    Frequency of Social Gatherings with Friends and Family: Once a week    Attends Religious Services: Not on Marketing executive or Organizations: Not on file    Attends Banker Meetings: Not on file    Marital Status: Not on file  Intimate Partner Violence: Not on file    Family History  Problem Relation Age of Onset   Aneurysm Mother    Cancer Father      Current Outpatient Medications:    albuterol (PROVENTIL HFA;VENTOLIN HFA) 108 (90 Base) MCG/ACT inhaler, Inhale 2 puffs into the lungs every 6 (six) hours as needed for wheezing or shortness of breath., Disp: 1 Inhaler, Rfl: 0   albuterol (PROVENTIL) (2.5 MG/3ML) 0.083% nebulizer solution, Take 2.5 mg by nebulization every 6 (six) hours as needed for wheezing or shortness of breath., Disp: , Rfl:    apixaban (ELIQUIS) 5 MG TABS tablet, Take 2.5 mg  by mouth 2 (two) times daily., Disp: , Rfl:    carboxymethylcellulose (REFRESH PLUS) 0.5 % SOLN, 1 drop 4 (four) times daily as needed., Disp: , Rfl:    finasteride (PROSCAR) 5 MG tablet, Take 5 mg by mouth daily., Disp: , Rfl:    fluticasone-salmeterol (ADVAIR) 250-50 MCG/ACT AEPB, Inhale 1 puff into the lungs in the morning and at bedtime., Disp: , Rfl:    glimepiride (AMARYL) 2 MG tablet, Take 2 mg by mouth daily with breakfast., Disp: , Rfl:    levothyroxine (SYNTHROID) 200 MCG tablet, Take 200 mcg by mouth daily before breakfast., Disp: , Rfl:    lidocaine-prilocaine (EMLA) cream, Apply to affected area once, Disp: 30 g, Rfl: 3   ondansetron (ZOFRAN) 8 MG tablet, Take 1 tablet (8 mg total) by mouth every 8 (eight) hours as needed for nausea, vomiting or refractory nausea / vomiting. Start on the third  day after carboplatin., Disp: 60 tablet, Rfl: 2   Oxycodone HCl 10 MG TABS, Take 1 tablet (10 mg total) by mouth every 6 (six) hours as needed. Okay to cut tablet in half if needed., Disp: 60 tablet, Rfl: 0   OXYGEN, Inhale 3 L into the lungs continuous., Disp: , Rfl:    prochlorperazine (COMPAZINE) 10 MG tablet, Take 1 tablet (10 mg total) by mouth every 6 (six) hours as needed for nausea or vomiting., Disp: 60 tablet, Rfl: 2   SEMAGLUTIDE,0.25 OR 0.5MG /DOS, [place], Inject 0.5 mg into the skin once a week., Disp: , Rfl:    tamsulosin (FLOMAX) 0.4 MG CAPS capsule, 0.8 mg daily., Disp: , Rfl:    Tiotropium Bromide Monohydrate (SPIRIVA RESPIMAT) 2.5 MCG/ACT AERS, Inhale 2 puffs into the lungs daily., Disp: 1 each, Rfl: 11   torsemide (DEMADEX) 20 MG tablet, TAKE 1 TABLET BY MOUTH EVERY OTHER DAY, Disp: 45 tablet, Rfl: 2   vitamin B-12 (CYANOCOBALAMIN) 1000 MCG tablet, Take 1,000 mcg by mouth daily., Disp: , Rfl:  No current facility-administered medications for this visit.  Facility-Administered Medications Ordered in Other Visits:    0.9 %  sodium chloride infusion, , Intravenous, Continuous, [place], Tollie Pizza, MD, Last Rate: 10 mL/hr at 04/05/23 1305, New Bag at 04/05/23 1305  Physical exam:  Vitals:   04/24/23 0911  BP: 114/72  Pulse: 73  Resp: 20  Temp: 98.7 F (37.1 C)  SpO2: 96%  Weight: 253 lb 1.6 oz (114.8 kg)   Physical Exam Constitutional:      Comments: Sitting in a wheelchair and appears in no acute distress.  He is on home oxygen  Cardiovascular:     Rate and Rhythm: Normal rate and regular rhythm.     Heart sounds: Normal heart sounds.  Pulmonary:     Effort: Pulmonary effort is normal.     Breath sounds: Normal breath sounds.  Abdominal:     General: Bowel sounds are normal.     Palpations: Abdomen is soft.  Skin:    General: Skin is warm and dry.  Neurological:     Mental Status: He is alert and oriented to person, place, and time.         Latest Ref Rng &  Units 04/24/2023    8:51 AM  CMP  Glucose 70 - 99 mg/dL 403   BUN 8 - 23 mg/dL 20   Creatinine 4.74 - 1.24 mg/dL 2.59   Sodium 563 - 875 mmol/L 133   Potassium 3.5 - 5.1 mmol/L 2.9   Chloride 98 - 111 mmol/L  96   CO2 22 - 32 mmol/L 24   Calcium 8.9 - 10.3 mg/dL 8.0   Total Protein 6.5 - 8.1 g/dL 6.3   Total Bilirubin <8.1 mg/dL 0.5   Alkaline Phos 38 - 126 U/L 61   AST 15 - 41 U/L 20   ALT 0 - 44 U/L 13       Latest Ref Rng & Units 04/24/2023    8:51 AM  CBC  WBC 4.0 - 10.5 K/uL 11.2   Hemoglobin 13.0 - 17.0 g/dL 19.1   Hematocrit 47.8 - 52.0 % 36.1   Platelets 150 - 400 K/uL 321      Assessment and plan- Patient is a 82 y.o. male with history of extensive stage small cell lung cancer with pleural metastases and pleural effusion here for on treatment assessment prior to cycle 3 of carbo etoposide Tecentriq chemotherapy  Counts okay to proceed with cycle 3 of carbo etoposide Tecentriq chemotherapy today.  He will receive etoposide on day 2 and day 4  Neulasta.  He will be skipping day 3 of etoposide for this cycle due to holidays.  He will be seen in 3 weeks by Dr. Orlie Dakin for cycle 4.  I will schedule CT chest abdomen and pelvis with contrast in 3 to 4 weeks time.  Neoplasm related pain: Continue as needed oxycodone  History of anxiety/depression he follows up with PCP and psychiatry at the [facility]  Hypokalemia: I will give him 20 mill equivalents of IV potassium today.  Holding off on IV fluids since serum creatinine has been fluctuating around 1.5 and he seems to have a component of CKD which has remained stable overall.   Visit Diagnosis 1. Encounter for antineoplastic chemotherapy   2. Encounter for antineoplastic immunotherapy   3. Small cell lung cancer, left (HCC)      Dr. Owens Shark, MD, MPH [facility] at [facility] [phone] 04/24/2023 8:44 AM

## 2023-04-24 NOTE — Addendum Note (Signed)
Addended by: Coralee Rud on: 04/24/2023 10:01 AM   Modules accepted: Orders

## 2023-04-25 ENCOUNTER — Inpatient Hospital Stay: Payer: Medicare Other

## 2023-04-25 VITALS — BP 112/66 | HR 64 | Temp 97.1°F | Resp 18

## 2023-04-25 DIAGNOSIS — Z5111 Encounter for antineoplastic chemotherapy: Secondary | ICD-10-CM | POA: Diagnosis not present

## 2023-04-25 DIAGNOSIS — C3492 Malignant neoplasm of unspecified part of left bronchus or lung: Secondary | ICD-10-CM

## 2023-04-25 LAB — T4: T4, Total: 8.9 ug/dL (ref 4.5–12.0)

## 2023-04-25 MED ORDER — ETOPOSIDE CHEMO INJECTION 1 GM/50ML
100.0000 mg/m2 | Freq: Once | INTRAVENOUS | Status: AC
  Administered 2023-04-25: 250 mg via INTRAVENOUS
  Filled 2023-04-25: qty 12.5

## 2023-04-25 MED ORDER — DEXAMETHASONE SODIUM PHOSPHATE 10 MG/ML IJ SOLN
10.0000 mg | Freq: Once | INTRAMUSCULAR | Status: AC
  Administered 2023-04-25: 10 mg via INTRAVENOUS
  Filled 2023-04-25: qty 1

## 2023-04-25 MED ORDER — HEPARIN SOD (PORK) LOCK FLUSH 100 UNIT/ML IV SOLN
500.0000 [IU] | Freq: Once | INTRAVENOUS | Status: AC | PRN
  Administered 2023-04-25: 500 [IU]
  Filled 2023-04-25: qty 5

## 2023-04-25 MED ORDER — SODIUM CHLORIDE 0.9 % IV SOLN
INTRAVENOUS | Status: DC
  Filled 2023-04-25: qty 250

## 2023-04-25 NOTE — Progress Notes (Signed)
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  Past medical history of COPD, HTN, hypothyroidism, melanoma of face, thyroid disease.  Patient receiving carboplatin, etoposide and atezolizumab.    Met with patient and wife during infusion. Reports eating about 50% less than normal intake.  Has had some nausea but takes medication and has improvement in symptoms.  Also has ginger candies he uses.  Breakfast is usually around 11am-1pm and is 2 eggs, 2 sausage patties or 4 slices of bacon, sometimes grits or hashbrowns.  Snacks on fruit during the day.  Supper is meat and potato and usually a green vegetable (broccoli).  Eats less fried foods.  Wife prepares meals.  Reports taste alterations, "food just does not taste right sometimes." Drinks sugar free Kool-aid mostly.    Medications: semaglutide for about a year for weight loss, prochlorperazine, KCL, zofran, glimepride, Vit B 12, toresemide   Labs: K 2.9, glucose 116, creatinine 1.52, calcium 8.0   Anthropometrics:   Height: 72 inches Weight: 253 lb 1.6 oz on 12/13 271 lb 11/10/22 BMI: 34  Patient reports increased fluid pill to daily and has lost fluid weight  7% weight loss in the last 5 months, concerning but some related to fluid loss   NUTRITION DIAGNOSIS: Unintentional weight loss related to cancer and related treatment along with edema as evidenced by decreased appetite, 7% weight loss and increase in fluid pill.     INTERVENTION:  Encouraged lean protein foods and vegetables, fruits, nuts, beans, whole grains.   Continue heart healthy cooking methods Discussed strategies to help with taste change.  Handout provided Contact information provided   MONITORING, EVALUATION, GOAL: weight trends, intake   Next Visit: Tuesday, [name] 14 during infusion  Khalea Ventura B. Freida Busman, RD,  LDN Registered Dietitian [phone]

## 2023-04-25 NOTE — Patient Instructions (Signed)
 [facility] - A DEPT OF [facility]  Discharge Instructions: Thank you for choosing [facility] to provide your oncology and hematology care.  If you have a lab appointment with the Cancer Center, please go directly to the Cancer Center and check in at the registration area.  Wear comfortable clothing and clothing appropriate for easy access to any Portacath or PICC line.   We strive to give you quality time with your provider. You may need to reschedule your appointment if you arrive late (15 or more minutes).  Arriving late affects you and other patients whose appointments are after yours.  Also, if you miss three or more appointments without notifying the office, you may be dismissed from the clinic at the provider's discretion.      For prescription refill requests, have your pharmacy contact our office and allow 72 hours for refills to be completed.    Today you received the following chemotherapy and/or immunotherapy agents- etoposide      To help prevent nausea and vomiting after your treatment, we encourage you to take your nausea medication as directed.  BELOW ARE SYMPTOMS THAT SHOULD BE REPORTED IMMEDIATELY: *FEVER GREATER THAN 100.4 F (38 C) OR HIGHER *CHILLS OR SWEATING *NAUSEA AND VOMITING THAT IS NOT CONTROLLED WITH YOUR NAUSEA MEDICATION *UNUSUAL SHORTNESS OF BREATH *UNUSUAL BRUISING OR BLEEDING *URINARY PROBLEMS (pain or burning when urinating, or frequent urination) *BOWEL PROBLEMS (unusual diarrhea, constipation, pain near the anus) TENDERNESS IN MOUTH AND THROAT WITH OR WITHOUT PRESENCE OF ULCERS (sore throat, sores in mouth, or a toothache) UNUSUAL RASH, SWELLING OR PAIN  UNUSUAL VAGINAL DISCHARGE OR ITCHING   Items with * indicate a potential emergency and should be followed up as soon as possible or go to the Emergency Department if any problems should occur.  Please show the CHEMOTHERAPY ALERT CARD or IMMUNOTHERAPY  ALERT CARD at check-in to the Emergency Department and triage nurse.  Should you have questions after your visit or need to cancel or reschedule your appointment, please contact [facility] - A DEPT OF [facility]  [phone] and follow the prompts.  Office hours are 8:00 a.m. to 4:30 p.m. Monday - Friday. Please note that voicemails left after 4:00 p.m. may not be returned until the following business day.  We are closed weekends and major holidays. You have access to a nurse at all times for urgent questions. Please call the main number to the clinic [phone] and follow the prompts.  For any non-urgent questions, you may also contact your provider using MyChart. We now offer e-Visits for anyone 78 and older to request care online for non-urgent symptoms. For details visit [email or web address].   Also download the MyChart app! Go to the app store, search "MyChart", open the app, select [facility], and log in with your MyChart username and password.

## 2023-04-27 ENCOUNTER — Inpatient Hospital Stay: Payer: Medicare Other | Admitting: Hospice and Palliative Medicine

## 2023-04-27 ENCOUNTER — Other Ambulatory Visit: Payer: Self-pay | Admitting: *Deleted

## 2023-04-27 ENCOUNTER — Inpatient Hospital Stay: Payer: Medicare Other

## 2023-04-27 ENCOUNTER — Other Ambulatory Visit: Payer: Self-pay

## 2023-04-27 ENCOUNTER — Encounter: Payer: Self-pay | Admitting: *Deleted

## 2023-04-27 VITALS — BP 111/71 | HR 80 | Temp 98.1°F | Resp 19

## 2023-04-27 DIAGNOSIS — R530 Neoplastic (malignant) related fatigue: Secondary | ICD-10-CM

## 2023-04-27 DIAGNOSIS — Z5111 Encounter for antineoplastic chemotherapy: Secondary | ICD-10-CM | POA: Diagnosis not present

## 2023-04-27 DIAGNOSIS — C3492 Malignant neoplasm of unspecified part of left bronchus or lung: Secondary | ICD-10-CM

## 2023-04-27 LAB — CBC WITH DIFFERENTIAL (CANCER CENTER ONLY)
Abs Immature Granulocytes: 0.04 10*3/uL (ref 0.00–0.07)
Basophils Absolute: 0 10*3/uL (ref 0.0–0.1)
Basophils Relative: 1 %
Eosinophils Absolute: 0 10*3/uL (ref 0.0–0.5)
Eosinophils Relative: 0 %
HCT: 37.5 % — ABNORMAL LOW (ref 39.0–52.0)
Hemoglobin: 12 g/dL — ABNORMAL LOW (ref 13.0–17.0)
Immature Granulocytes: 1 %
Lymphocytes Relative: 12 %
Lymphs Abs: 0.7 10*3/uL (ref 0.7–4.0)
MCH: 29.7 pg (ref 26.0–34.0)
MCHC: 32 g/dL (ref 30.0–36.0)
MCV: 92.8 fL (ref 80.0–100.0)
Monocytes Absolute: 0.1 10*3/uL (ref 0.1–1.0)
Monocytes Relative: 2 %
Neutro Abs: 4.7 10*3/uL (ref 1.7–7.7)
Neutrophils Relative %: 84 %
Platelet Count: 319 10*3/uL (ref 150–400)
RBC: 4.04 MIL/uL — ABNORMAL LOW (ref 4.22–5.81)
RDW: 14.6 % (ref 11.5–15.5)
WBC Count: 5.6 10*3/uL (ref 4.0–10.5)
nRBC: 0 % (ref 0.0–0.2)

## 2023-04-27 LAB — CMP (CANCER CENTER ONLY)
ALT: 13 U/L (ref 0–44)
AST: 19 U/L (ref 15–41)
Albumin: 3.2 g/dL — ABNORMAL LOW (ref 3.5–5.0)
Alkaline Phosphatase: 54 U/L (ref 38–126)
Anion gap: 10 (ref 5–15)
BUN: 35 mg/dL — ABNORMAL HIGH (ref 8–23)
CO2: 23 mmol/L (ref 22–32)
Calcium: 8 mg/dL — ABNORMAL LOW (ref 8.9–10.3)
Chloride: 105 mmol/L (ref 98–111)
Creatinine: 1.21 mg/dL (ref 0.61–1.24)
GFR, Estimated: >60 mL/min (ref >60)
Glucose, Bld: 110 mg/dL — ABNORMAL HIGH (ref 70–99)
Potassium: 4.3 mmol/L (ref 3.5–5.1)
Sodium: 138 mmol/L (ref 135–145)
Total Bilirubin: 0.7 mg/dL (ref <1.2)
Total Protein: 6.4 g/dL — ABNORMAL LOW (ref 6.5–8.1)

## 2023-04-27 LAB — MAGNESIUM: Magnesium: 1.5 mg/dL — ABNORMAL LOW (ref 1.7–2.4)

## 2023-04-27 MED ORDER — PEGFILGRASTIM INJECTION 6 MG/0.6ML ~~LOC~~
6.0000 mg | PREFILLED_SYRINGE | Freq: Once | SUBCUTANEOUS | Status: AC
Start: 2023-04-27 — End: 2023-04-27
  Administered 2023-04-27: 6 mg via SUBCUTANEOUS
  Filled 2023-04-27: qty 0.6

## 2023-04-27 NOTE — Progress Notes (Signed)
Symptom Management Clinic [facility] at [facility] [phone] [phone]  Patient Care Team: [facility] as PCP - General (General Practice) Debbe Odea, MD as PCP - Cardiology (Cardiology) Lanier Prude, MD as PCP - Electrophysiology (Cardiology) Glory Buff, RN as Registered Nurse Orlie Dakin, Tollie Pizza, MD as Consulting Physician (Oncology) Carmina Miller, MD as Referring Physician (Radiation Oncology) Hulda Marin, MD (Inactive) as Referring Physician (Cardiothoracic Surgery)   NAME OF PATIENT: Levi [name]  [id number]  15-[name]-1941   DATE OF VISIT: 04/27/23  REASON FOR CONSULT: Levi [name] is a 82 y.o. 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   INTERVAL HISTORY: Patient received cycle 3 carbo/etoposide/Tecentriq on 04/24/2023.  He returned for day 2, cycle 3 etoposide on 04/25/2023.  Patient returns today for Neulasta injection.  He is an add-on to [facility] to evaluate for weakness.  Patient says that he is felt weak and fatigued today.  Denies other symptomatic complaints or concerns.  No fever or chills.  Oral intake is fair.  Had 1 episode of nausea but no vomiting.  Improved after he took antiemetic.  Has had loose stools on chemotherapy.  Denies urinary complaints.  Denies any neurologic complaints. Denies recent fevers or illnesses. Denies any easy bleeding or bruising. Denies chest pain. Patient offers no further specific complaints today.  PAST MEDICAL HISTORY: Past Medical History:  Diagnosis Date   Asthma    COPD (chronic obstructive pulmonary disease) (HCC)    Hearing loss    Hypertension    Hypothyroidism    Mass of left lung    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HCC)     Prostate enlargement    Shortness of breath    Squamous cell carcinoma of lung, left (HCC)    Thyroid disease    Tobacco abuse     PAST SURGICAL HISTORY:  Past Surgical History:  Procedure Laterality Date   CARDIOVERSION N/A    CARDIOVERSION N/A 11/24/2021   Procedure: CARDIOVERSION;  Surgeon: Yvonne Kendall, MD;  Location: [facility] ORS;  Service: Cardiovascular;  Laterality: N/A;   ELECTROMAGNETIC NAVIGATION BROCHOSCOPY Left 10/19/2018   Procedure: ELECTROMAGNETIC NAVIGATION BRONCHOSCOPY LEFT;  Surgeon: Salena Saner, MD;  Location: [facility] ORS;  Service: Cardiopulmonary;  Laterality: Left;   ENDOBRONCHIAL ULTRASOUND Left 10/19/2018   Procedure: ENDOBRONCHIAL ULTRASOUND LEFT;  Surgeon: Salena Saner, MD;  Location: [facility] ORS;  Service: Cardiopulmonary;  Laterality: Left;   IR IMAGING GUIDED PORT INSERTION  03/03/2023   IR THORACENTESIS ASP PLEURAL SPACE W/IMG GUIDE  03/01/2023   MELANOMA EXCISION Left    NO PAST SURGERIES      HEMATOLOGY/ONCOLOGY HISTORY:  Oncology History  Squamous cell carcinoma lung, left (HCC)  10/27/2018 Initial Diagnosis   Squamous cell carcinoma lung, left (HCC)   10/27/2018 Cancer Staging   Staging form: Lung, AJCC 8th Edition - Clinical stage from 10/27/2018: Stage IIB (cT1c, cN1, cM0) - Signed by Jeralyn Ruths, MD on 11/16/2018   11/27/2018 - 01/09/2019 Chemotherapy   Patient is on Treatment Plan : LUNG Carboplatin / Paclitaxel + XRT q7d     Small cell lung cancer, left (HCC)  02/24/2023 Initial Diagnosis   Small cell lung cancer, left (HCC)   02/24/2023 Cancer Staging   Staging form: Lung, AJCC 8th Edition - Clinical stage from 02/24/2023:  Stage IVA (cT4, cN2, cM1a) - Signed by Jeralyn Ruths, MD on 02/24/2023   03/06/2023 -  Chemotherapy   Patient is on Treatment Plan : LUNG SCLC Carboplatin + Etoposide + Atezolizumab Induction q21d x 4 cycles / Atezolizumab Maintenance q21d       ALLERGIES:  has no known allergies.  MEDICATIONS:   Current Outpatient Medications  Medication Sig Dispense Refill   albuterol (PROVENTIL HFA;VENTOLIN HFA) 108 (90 Base) MCG/ACT inhaler Inhale 2 puffs into the lungs every 6 (six) hours as needed for wheezing or shortness of breath. 1 Inhaler 0   albuterol (PROVENTIL) (2.5 MG/3ML) 0.083% nebulizer solution Take 2.5 mg by nebulization every 6 (six) hours as needed for wheezing or shortness of breath.     apixaban (ELIQUIS) 5 MG TABS tablet Take 2.5 mg by mouth 2 (two) times daily.     carboxymethylcellulose (REFRESH PLUS) 0.5 % SOLN 1 drop 4 (four) times daily as needed.     finasteride (PROSCAR) 5 MG tablet Take 5 mg by mouth daily.     fluticasone-salmeterol (ADVAIR) 250-50 MCG/ACT AEPB Inhale 1 puff into the lungs in the morning and at bedtime.     glimepiride (AMARYL) 2 MG tablet Take 2 mg by mouth daily with breakfast.     levothyroxine (SYNTHROID) 200 MCG tablet Take 200 mcg by mouth daily before breakfast.     lidocaine-prilocaine (EMLA) cream Apply to affected area once 30 g 3   ondansetron (ZOFRAN) 8 MG tablet Take 1 tablet (8 mg total) by mouth every 8 (eight) hours as needed for nausea, vomiting or refractory nausea / vomiting. Start on the third day after carboplatin. 60 tablet 2   Oxycodone HCl 10 MG TABS Take 1 tablet (10 mg total) by mouth every 6 (six) hours as needed. Okay to cut tablet in half if needed. 60 tablet 0   OXYGEN Inhale 3 L into the lungs continuous.     potassium chloride SA (KLOR-CON M) 20 MEQ tablet Take 1 tablet (20 mEq total) by mouth daily. 14 tablet 0   prochlorperazine (COMPAZINE) 10 MG tablet Take 1 tablet (10 mg total) by mouth every 6 (six) hours as needed for nausea or vomiting. (Patient not taking: Reported on 04/24/2023) 60 tablet 2   SEMAGLUTIDE,0.25 OR 0.5MG /DOS, [place] Inject 0.5 mg into the skin once a week.     tamsulosin (FLOMAX) 0.4 MG CAPS capsule 0.8 mg daily.     Tiotropium Bromide Monohydrate (SPIRIVA RESPIMAT) 2.5 MCG/ACT AERS Inhale 2 puffs into  the lungs daily. 1 each 11   torsemide (DEMADEX) 20 MG tablet TAKE 1 TABLET BY MOUTH EVERY OTHER DAY 45 tablet 2   vitamin B-12 (CYANOCOBALAMIN) 1000 MCG tablet Take 1,000 mcg by mouth daily.     No current facility-administered medications for this visit.   Facility-Administered Medications Ordered in Other Visits  Medication Dose Route Frequency Provider Last Rate Last Admin   0.9 %  sodium chloride infusion   Intravenous Continuous Jeralyn Ruths, MD 10 mL/hr at 04/05/23 1305 New Bag at 04/05/23 1305   pegfilgrastim (NEULASTA) injection 6 mg  6 mg Subcutaneous Once Creig Hines, MD        VITAL SIGNS: BP 111/71 (Patient Position: Sitting)   Pulse 80   Temp 98.1 F (36.7 C)   Resp 19  There were no vitals filed for this visit.  Estimated body mass index is 34.33 kg/m as calculated from the following:   Height as of 04/19/23: 6' (1.829  m).   Weight as of 04/24/23: 253 lb 1.6 oz (114.8 kg).  LABS: CBC:    Component Value Date/Time   WBC 11.2 (H) 04/24/2023 0851   WBC 6.4 02/27/2023 0838   HGB 11.8 (L) 04/24/2023 0851   HCT 36.1 (L) 04/24/2023 0851   PLT 321 04/24/2023 0851   MCV 92.6 04/24/2023 0851   MCV 90.0 10/20/2015 1020   NEUTROABS 8.1 (H) 04/24/2023 0851   LYMPHSABS 1.1 04/24/2023 0851   MONOABS 1.3 (H) 04/24/2023 0851   EOSABS 0.0 04/24/2023 0851   BASOSABS 0.1 04/24/2023 0851   Comprehensive Metabolic Panel:    Component Value Date/Time   NA 133 (L) 04/24/2023 0851   K 2.9 (L) 04/24/2023 0851   CL 96 (L) 04/24/2023 0851   CO2 24 04/24/2023 0851   BUN 20 04/24/2023 0851   CREATININE 1.52 (H) 04/24/2023 0851   CREATININE 1.29 10/31/2014 1159   GLUCOSE 116 (H) 04/24/2023 0851   CALCIUM 8.0 (L) 04/24/2023 0851   AST 20 04/24/2023 0851   ALT 13 04/24/2023 0851   ALKPHOS 61 04/24/2023 0851   BILITOT 0.5 04/24/2023 0851   PROT 6.3 (L) 04/24/2023 0851   ALBUMIN 3.2 (L) 04/24/2023 0851    RADIOGRAPHIC STUDIES: No results found.  PERFORMANCE  STATUS (ECOG) : 3 - Symptomatic, >50% confined to bed  Review of Systems Unless otherwise noted, a complete review of systems is negative.  Physical Exam General: NAD Cardiovascular: regular rate and rhythm Pulmonary: clear anterior/posterior fields Abdomen: soft, nontender, + bowel sounds GU: no suprapubic tenderness Extremities: no edema, no joint deformities Skin: no rashes Neurological: Weakness but otherwise nonfocal  IMPRESSION/PLAN: Stage IV SCLC -on treatment with carbo/etoposide/Tecentriq chemotherapy.  Proceed with Neulasta injection today.  Weakness -likely multifactorial but likely chemotherapy is a contributing factor.  Labs grossly unchanged.  Potassium improved on oral supplement.  Recommended supportive care.  Patient declined home health referral.  He states that he has needed DME including walker and wheelchair at home.  Follow-up 2 weeks or sooner if needed.  Case and plan discussed with Dr. Orlie Dakin.  Patient expressed understanding and was in agreement with this plan. He also understands that He can call clinic at any time with any questions, concerns, or complaints.   Thank you for allowing me to participate in the care of this very pleasant patient.   Time Total: 20 minutes  Visit consisted of counseling and education dealing with the complex and emotionally intense issues of symptom management in the setting of serious illness.Greater than 50%  of this time was spent counseling and coordinating care related to the above assessment and plan.  Signed by: Laurette Schimke, PhD, NP-C

## 2023-04-27 NOTE — Progress Notes (Signed)
Vitals, they are stable. He is having increased weakness, legs are giving out on him. It took her 2 hours to get him ready to come into clinic today. They are not wanting him to get the neulasta injection until he is seen. He has been declining overall recently. Levi [name] has seen patient recently

## 2023-04-28 ENCOUNTER — Ambulatory Visit: Payer: Medicare Other

## 2023-05-08 ENCOUNTER — Encounter: Payer: Self-pay | Admitting: Oncology

## 2023-05-11 ENCOUNTER — Ambulatory Visit: Payer: Medicare Other

## 2023-05-12 MED FILL — Fosaprepitant Dimeglumine For IV Infusion 150 MG (Base Eq): INTRAVENOUS | Qty: 5 | Status: AC

## 2023-05-15 ENCOUNTER — Inpatient Hospital Stay (HOSPITAL_BASED_OUTPATIENT_CLINIC_OR_DEPARTMENT_OTHER): Payer: Medicare Other | Admitting: Oncology

## 2023-05-15 ENCOUNTER — Inpatient Hospital Stay: Payer: Medicare Other | Attending: Oncology

## 2023-05-15 ENCOUNTER — Encounter: Payer: Self-pay | Admitting: Oncology

## 2023-05-15 ENCOUNTER — Other Ambulatory Visit: Payer: Self-pay | Admitting: *Deleted

## 2023-05-15 ENCOUNTER — Other Ambulatory Visit: Payer: Self-pay

## 2023-05-15 ENCOUNTER — Inpatient Hospital Stay (HOSPITAL_BASED_OUTPATIENT_CLINIC_OR_DEPARTMENT_OTHER): Payer: Medicare Other | Admitting: Hospice and Palliative Medicine

## 2023-05-15 ENCOUNTER — Inpatient Hospital Stay: Payer: Medicare Other

## 2023-05-15 VITALS — BP 117/76 | HR 87 | Temp 98.8°F | Resp 20 | Ht 72.0 in | Wt 252.9 lb

## 2023-05-15 DIAGNOSIS — C3492 Malignant neoplasm of unspecified part of left bronchus or lung: Secondary | ICD-10-CM

## 2023-05-15 DIAGNOSIS — Z79899 Other long term (current) drug therapy: Secondary | ICD-10-CM | POA: Insufficient documentation

## 2023-05-15 DIAGNOSIS — Z515 Encounter for palliative care: Secondary | ICD-10-CM

## 2023-05-15 DIAGNOSIS — C3412 Malignant neoplasm of upper lobe, left bronchus or lung: Secondary | ICD-10-CM | POA: Insufficient documentation

## 2023-05-15 DIAGNOSIS — Z5111 Encounter for antineoplastic chemotherapy: Secondary | ICD-10-CM | POA: Insufficient documentation

## 2023-05-15 DIAGNOSIS — E876 Hypokalemia: Secondary | ICD-10-CM

## 2023-05-15 DIAGNOSIS — Z5112 Encounter for antineoplastic immunotherapy: Secondary | ICD-10-CM | POA: Insufficient documentation

## 2023-05-15 LAB — CBC WITH DIFFERENTIAL (CANCER CENTER ONLY)
Abs Immature Granulocytes: 0.08 10*3/uL — ABNORMAL HIGH (ref 0.00–0.07)
Basophils Absolute: 0.1 10*3/uL (ref 0.0–0.1)
Basophils Relative: 1 %
Eosinophils Absolute: 0.1 10*3/uL (ref 0.0–0.5)
Eosinophils Relative: 1 %
HCT: 34.6 % — ABNORMAL LOW (ref 39.0–52.0)
Hemoglobin: 11.3 g/dL — ABNORMAL LOW (ref 13.0–17.0)
Immature Granulocytes: 1 %
Lymphocytes Relative: 19 %
Lymphs Abs: 1.2 10*3/uL (ref 0.7–4.0)
MCH: 30.5 pg (ref 26.0–34.0)
MCHC: 32.7 g/dL (ref 30.0–36.0)
MCV: 93.3 fL (ref 80.0–100.0)
Monocytes Absolute: 0.9 10*3/uL (ref 0.1–1.0)
Monocytes Relative: 15 %
Neutro Abs: 3.9 10*3/uL (ref 1.7–7.7)
Neutrophils Relative %: 63 %
Platelet Count: 228 10*3/uL (ref 150–400)
RBC: 3.71 MIL/uL — ABNORMAL LOW (ref 4.22–5.81)
RDW: 16.5 % — ABNORMAL HIGH (ref 11.5–15.5)
WBC Count: 6.1 10*3/uL (ref 4.0–10.5)
nRBC: 0 % (ref 0.0–0.2)

## 2023-05-15 LAB — CMP (CANCER CENTER ONLY)
ALT: 10 U/L (ref 0–44)
AST: 19 U/L (ref 15–41)
Albumin: 3.1 g/dL — ABNORMAL LOW (ref 3.5–5.0)
Alkaline Phosphatase: 65 U/L (ref 38–126)
Anion gap: 12 (ref 5–15)
BUN: 21 mg/dL (ref 8–23)
CO2: 23 mmol/L (ref 22–32)
Calcium: 7.9 mg/dL — ABNORMAL LOW (ref 8.9–10.3)
Chloride: 102 mmol/L (ref 98–111)
Creatinine: 1.19 mg/dL (ref 0.61–1.24)
GFR, Estimated: >60 mL/min (ref >60)
Glucose, Bld: 130 mg/dL — ABNORMAL HIGH (ref 70–99)
Potassium: 2.9 mmol/L — ABNORMAL LOW (ref 3.5–5.1)
Sodium: 137 mmol/L (ref 135–145)
Total Bilirubin: 0.7 mg/dL (ref 0.0–1.2)
Total Protein: 5.9 g/dL — ABNORMAL LOW (ref 6.5–8.1)

## 2023-05-15 LAB — MAGNESIUM: Magnesium: 1.1 mg/dL — ABNORMAL LOW (ref 1.7–2.4)

## 2023-05-15 MED ORDER — ATEZOLIZUMAB CHEMO INJECTION 1200 MG/20ML
1200.0000 mg | Freq: Once | INTRAVENOUS | Status: AC
  Administered 2023-05-15: 1200 mg via INTRAVENOUS
  Filled 2023-05-15: qty 20

## 2023-05-15 MED ORDER — MAGNESIUM SULFATE 4 GM/100ML IV SOLN
4.0000 g | Freq: Once | INTRAVENOUS | Status: AC
  Administered 2023-05-15: 4 g via INTRAVENOUS

## 2023-05-15 MED ORDER — SODIUM CHLORIDE 0.9 % IV SOLN
INTRAVENOUS | Status: DC
  Filled 2023-05-15: qty 250

## 2023-05-15 MED ORDER — POTASSIUM CHLORIDE 20 MEQ/100ML IV SOLN
20.0000 meq | INTRAVENOUS | Status: AC
  Administered 2023-05-15 (×2): 20 meq via INTRAVENOUS

## 2023-05-15 MED ORDER — HEPARIN SOD (PORK) LOCK FLUSH 100 UNIT/ML IV SOLN
500.0000 [IU] | Freq: Once | INTRAVENOUS | Status: AC | PRN
  Administered 2023-05-15: 500 [IU]
  Filled 2023-05-15: qty 5

## 2023-05-15 MED ORDER — SODIUM CHLORIDE 0.9 % IV SOLN: 40.0000 meq | Freq: Once | INTRAVENOUS | Status: DC

## 2023-05-15 MED ORDER — POTASSIUM CHLORIDE CRYS ER 20 MEQ PO TBCR: 20.0000 meq | EXTENDED_RELEASE_TABLET | Freq: Two times a day (BID) | ORAL | 1 refills | Status: DC

## 2023-05-15 MED ORDER — SODIUM CHLORIDE 0.9 % IV SOLN
552.0000 mg | Freq: Once | INTRAVENOUS | Status: AC
  Administered 2023-05-15: 550 mg via INTRAVENOUS
  Filled 2023-05-15: qty 55

## 2023-05-15 MED ORDER — PALONOSETRON HCL INJECTION 0.25 MG/5ML
0.2500 mg | Freq: Once | INTRAVENOUS | Status: AC
  Administered 2023-05-15: 0.25 mg via INTRAVENOUS
  Filled 2023-05-15: qty 5

## 2023-05-15 MED ORDER — FOSAPREPITANT DIMEGLUMINE INJECTION 150 MG
150.0000 mg | Freq: Once | INTRAVENOUS | Status: AC
  Administered 2023-05-15: 150 mg via INTRAVENOUS
  Filled 2023-05-15: qty 150

## 2023-05-15 MED ORDER — DIPHENHYDRAMINE HCL 50 MG/ML IJ SOLN
25.0000 mg | Freq: Once | INTRAMUSCULAR | Status: AC
  Administered 2023-05-15: 25 mg via INTRAVENOUS
  Filled 2023-05-15: qty 1

## 2023-05-15 MED ORDER — SODIUM CHLORIDE 0.9 % IV SOLN
100.0000 mg/m2 | Freq: Once | INTRAVENOUS | Status: AC
Start: 2023-05-15 — End: 2023-05-15
  Administered 2023-05-15: 250 mg via INTRAVENOUS
  Filled 2023-05-15: qty 12.5

## 2023-05-15 MED ORDER — DEXAMETHASONE SODIUM PHOSPHATE 10 MG/ML IJ SOLN
10.0000 mg | Freq: Once | INTRAMUSCULAR | Status: AC
Start: 2023-05-15 — End: 2023-05-15
  Administered 2023-05-15: 10 mg via INTRAVENOUS
  Filled 2023-05-15: qty 1

## 2023-05-15 MED ORDER — FAMOTIDINE IN NACL 20-0.9 MG/50ML-% IV SOLN
20.0000 mg | Freq: Once | INTRAVENOUS | Status: AC
Start: 2023-05-15 — End: 2023-05-15
  Administered 2023-05-15: 20 mg via INTRAVENOUS
  Filled 2023-05-15: qty 50

## 2023-05-15 NOTE — Progress Notes (Signed)
 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  INTERVAL HISTORY: Patient returns to clinic today for further evaluation and consideration of cycle 4 of carboplatinum, etoposide , and Tecentriq .  He has chronic shortness of breath and requires oxygen majority of the day and at night.  He also has chronic weakness and fatigue, but otherwise is tolerating his treatments well. He has no neurologic complaints.  He denies any chest pain, cough, or hemoptysis.  He has a good appetite and denies weight loss.  He denies any nausea, vomiting, constipation, or diarrhea.  He has no urinary complaints.  Patient offers no further specific complaints today.  REVIEW OF SYSTEMS:   Review of Systems  Constitutional:  Positive for malaise/fatigue. Negative for fever and weight loss.  HENT: Negative.  Negative for congestion.   Respiratory:  Positive for shortness of breath. Negative for cough and hemoptysis.   Cardiovascular:  Positive for leg swelling. Negative for chest pain.  Gastrointestinal: Negative.  Negative for abdominal pain and nausea.  Genitourinary: Negative.  Negative for dysuria.  Musculoskeletal: Negative.  Negative for back pain.  Skin:  Negative for rash.  Neurological:  Positive for weakness. Negative for dizziness, focal weakness and headaches.  Endo/Heme/Allergies:  Does not bruise/bleed easily.   Psychiatric/Behavioral:  Negative for depression. The patient is nervous/anxious.     As per HPI. Otherwise, a complete review of systems is negative.  PAST MEDICAL HISTORY: Past Medical History:  Diagnosis Date   Asthma    COPD (chronic obstructive pulmonary disease) (HCC)    Hearing loss    Hypertension    Hypothyroidism    Mass of left lung    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HCC)    Prostate enlargement    Shortness of breath    Squamous cell carcinoma of lung, left (HCC)    Thyroid  disease    Tobacco abuse     PAST SURGICAL HISTORY: Past Surgical History:  Procedure Laterality Date   CARDIOVERSION N/A    CARDIOVERSION N/A 11/24/2021   Procedure: CARDIOVERSION;  Surgeon: Mady Bruckner, MD;  Location: [facility] ORS;  Service: Cardiovascular;  Laterality: N/A;   ELECTROMAGNETIC NAVIGATION BROCHOSCOPY Left 10/19/2018   Procedure: ELECTROMAGNETIC NAVIGATION BRONCHOSCOPY LEFT;  Surgeon: Tamea Dedra CROME, MD;  Location: [facility] ORS;  Service: Cardiopulmonary;  Laterality: Left;   ENDOBRONCHIAL ULTRASOUND Left 10/19/2018   Procedure: ENDOBRONCHIAL ULTRASOUND LEFT;  Surgeon: Tamea Dedra CROME, MD;  Location: [facility] ORS;  Service: Cardiopulmonary;  Laterality: Left;   IR IMAGING GUIDED PORT INSERTION  03/03/2023   IR THORACENTESIS ASP PLEURAL SPACE W/IMG GUIDE  03/01/2023   MELANOMA EXCISION Left    NO PAST SURGERIES      FAMILY HISTORY: Family History  Problem Relation Age of Onset   Aneurysm Mother    Cancer Father     ADVANCED DIRECTIVES (Y/N):  N  HEALTH MAINTENANCE: Social History   Tobacco Use   Smoking status: Former    Current packs/day: 0.00    Average packs/day: 0.3 packs/day for  62.0 years (15.5 ttl pk-yrs)    Types: Cigarettes    Start date: 09/04/1956    Quit date: 09/05/2018    Years since quitting: 4.6   Smokeless tobacco: Never  Vaping Use   Vaping status: Never Used  Substance Use Topics   Alcohol use: No    Alcohol/week: 0.0 standard drinks of alcohol    Drug use: No     Colonoscopy:  PAP:  Bone density:  Lipid panel:  No Known Allergies  Current Outpatient Medications  Medication Sig Dispense Refill   albuterol  (PROVENTIL  HFA;VENTOLIN  HFA) 108 (90 Base) MCG/ACT inhaler Inhale 2 puffs into the lungs every 6 (six) hours as needed for wheezing or shortness of breath. 1 Inhaler 0   albuterol  (PROVENTIL ) (2.5 MG/3ML) 0.083% nebulizer solution Take 2.5 mg by nebulization every 6 (six) hours as needed for wheezing or shortness of breath.     apixaban  (ELIQUIS ) 5 MG TABS tablet Take 2.5 mg by mouth 2 (two) times daily.     carboxymethylcellulose (REFRESH PLUS) 0.5 % SOLN 1 drop 4 (four) times daily as needed.     finasteride (PROSCAR) 5 MG tablet Take 5 mg by mouth daily.     fluticasone -salmeterol (ADVAIR) 250-50 MCG/ACT AEPB Inhale 1 puff into the lungs in the morning and at bedtime.     glimepiride  (AMARYL ) 2 MG tablet Take 2 mg by mouth daily with breakfast.     levothyroxine  (SYNTHROID ) 200 MCG tablet Take 200 mcg by mouth daily before breakfast.     lidocaine -prilocaine  (EMLA ) cream Apply to affected area once 30 g 3   ondansetron  (ZOFRAN ) 8 MG tablet Take 1 tablet (8 mg total) by mouth every 8 (eight) hours as needed for nausea, vomiting or refractory nausea / vomiting. Start on the third day after carboplatin . 60 tablet 2   Oxycodone  HCl 10 MG TABS Take 1 tablet (10 mg total) by mouth every 6 (six) hours as needed. Okay to cut tablet in half if needed. 60 tablet 0   OXYGEN Inhale 3 L into the lungs continuous.     potassium chloride  SA (KLOR-CON  M) 20 MEQ tablet Take 1 tablet (20 mEq total) by mouth daily. 14 tablet 0   SEMAGLUTIDE,0.25 OR 0.5MG /DOS, [place] Inject 0.5 mg into the skin once a week.     tamsulosin  (FLOMAX ) 0.4 MG CAPS capsule 0.8 mg daily.     Tiotropium Bromide Monohydrate  (SPIRIVA  RESPIMAT) 2.5 MCG/ACT AERS Inhale 2 puffs into the lungs daily. 1 each 11   torsemide  (DEMADEX ) 20 MG tablet TAKE 1 TABLET BY MOUTH EVERY OTHER  DAY 45 tablet 2   vitamin B-12 (CYANOCOBALAMIN ) 1000 MCG tablet Take 1,000 mcg by mouth daily.     prochlorperazine  (COMPAZINE ) 10 MG tablet Take 1 tablet (10 mg total) by mouth every 6 (six) hours as needed for nausea or vomiting. (Patient not taking: Reported on 05/15/2023) 60 tablet 2   No current facility-administered medications for this visit.   Facility-Administered Medications Ordered in Other Visits  Medication Dose Route Frequency Provider Last Rate Last Admin   0.9 %  sodium chloride  infusion   Intravenous Continuous Der Gagliano J, MD 10 mL/hr at 04/05/23 1305 New Bag at 04/05/23 1305   potassium chloride  20 mEq in 100 mL IVPB  20 mEq Intravenous Q1 Hr x 2 [name] [name], MD        OBJECTIVE: Vitals:   05/15/23 0841  BP: 117/76  Pulse: 87  Resp: 20  Temp: 98.8 F (37.1 C)  SpO2: 99%      Body mass index is 34.3 kg/m.    ECOG FS:1 - Symptomatic but completely ambulatory  General: Well-developed, well-nourished, no acute distress. Eyes: Pink conjunctiva, anicteric sclera. HEENT: Normocephalic, moist mucous membranes. Lungs: No audible wheezing or coughing. Heart: Regular rate and rhythm. Abdomen: Soft, nontender, no obvious distention. Musculoskeletal: No edema, cyanosis, or clubbing. Neuro: Alert, answering all questions appropriately. Cranial nerves grossly intact. Skin: No rashes or petechiae noted. Psych: Normal affect.  LAB RESULTS:  Lab Results  Component Value Date   NA 137 05/15/2023   K 2.9 (L) 05/15/2023   CL 102 05/15/2023   CO2 23 05/15/2023   GLUCOSE 130 (H) 05/15/2023   BUN 21 05/15/2023   CREATININE 1.19 05/15/2023   CALCIUM 7.9 (L) 05/15/2023   PROT 5.9 (L) 05/15/2023   ALBUMIN 3.1 (L) 05/15/2023   AST 19 05/15/2023   ALT 10 05/15/2023   ALKPHOS 65 05/15/2023   BILITOT 0.7 05/15/2023   GFRNONAA >60 05/15/2023   GFRAA 58 (L) 07/18/2019    Lab Results  Component Value Date   WBC 6.1 05/15/2023   NEUTROABS 3.9 05/15/2023    HGB 11.3 (L) 05/15/2023   HCT 34.6 (L) 05/15/2023   MCV 93.3 05/15/2023   PLT 228 05/15/2023     STUDIES: No results found.   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  PET scan results from February 14, 2023 reviewed independently and reported as above confirming stage of disease although patient does not have metastatic disease outside his chest.  MRI of the brain on February 14, 2023 was negative for metastatic disease.  Patient wishes to pursue chemotherapy, therefore will receive carboplatinum, etoposide , and Tecentriq  on day 1 with etoposide  only on day 2 and 3.  Patient will require Fulphilia support.  Plan to do 4 cycles every 3 weeks and then reimage with PET scan.  If improvement of disease burden, will transition to Tecentriq  maintenance every 3 weeks.  Patient has had port placement.  Proceed with cycle 4, day 1 of treatment today.  Return to clinic in 1 and 2 days etoposide  only.  Patient will then return to clinic in 3 days for Fulphilia.  Will repeat PET scan in 2 weeks and then patient will return to clinic in 3 weeks for further evaluation and consideration of maintenance Tecentriq  if there is significant improvement of PET scan.       Clinical stage IIB squamous cell carcinoma, left upper lobe lung:  Patient underwent biopsy with navigational bronchoscopy on October 19, 2018 confirming diagnosis.  Patient declined surgery and wished to pursue XRT along with concurrent chemotherapy.  Given his stage of disease he did not receive maintenance immunotherapy.  He completed cycle 7 of weekly carboplatinum and Taxol  on January 09, 2019 and then completed XRT on January 14, 2019.   History of melanoma: Unclear stage or depth, although by report was in situ.  Patient had Mohs surgery in fall 2019.  Previous lung biopsy consistent with squamous cell  carcinoma. Anxiety/depression: Chronic and unchanged.  Continue current medications as prescribed.  Continue follow-up with primary care physician as well as psychiatry at the [place]. Hypokalemia: Patient will receive 40 mEq IV potassium today.  He was also given a prescription for oral potassium. Hypomagnesia: Patient will require 4 g IV magnesium . Shortness of breath/cough: Improved.  Patient's  last thoracic ultrasound did not reveal any significant fluid.  Continue oxygen as prescribed. Pain: Does not complain of this today.  Continue oxycodone  as needed. COVID: Resolved.  Patient completed a prescription of Paxlovid . Peripheral edema: Chronic and unchanged.  Patient has been instructed to increase his torsemide  to daily if his blood pressure remains above 110 systolic. Hyponatremia: Resolved.  Patient expressed understanding and was in agreement with this plan. He also understands that He can call clinic at any time with any questions, concerns, or complaints.    Cancer Staging  Small cell lung cancer, left [facility]) Staging form: Lung, AJCC 8th Edition - Clinical stage from 02/24/2023: Stage IVA (cT4, cN2, cM1a) - Signed by [name] [name], MD on 02/24/2023  Squamous cell carcinoma lung, left (HCC) Staging form: Lung, AJCC 8th Edition - Clinical stage from 10/27/2018: Stage IIB (cT1c, cN1, cM0) - Signed by [name] [name], MD on 11/16/2018   [name] [name], MD   05/15/2023 9:33 AM

## 2023-05-15 NOTE — Progress Notes (Signed)
 Palliative Medicine [facility] [phone]) [phone] [phone]) [phone]   Name: Levi [name] Date: 05/15/2023 MRN: [id number]  DOB: May 16, 1940  [facility] - General (General Practice) [name], MD as PCP - Cardiology (Cardiology) Cindie Ole DASEN, MD as PCP - Electrophysiology (Cardiology) [name], RN as Registered Nurse [name], [name], MD as Consulting Physician (Oncology) [name], MD as Referring Physician (Radiation Oncology) Volney Evalene, MD (Inactive) as Referring Physician (Cardiothoracic Surgery)    REASON FOR CONSULTATION: Levi [name] is a 83 y.o. 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  Patient was referred to palliative care to address goals manage ongoing symptoms.  SOCIAL HISTORY:     reports that he quit smoking about 4 years ago. His smoking use included cigarettes. He started smoking about 66 years ago. He has a 15.5 pack-year smoking history. He has never used smokeless tobacco. He reports that he does not drink alcohol and does not use drugs.  Patient is married and lives at home with his wife.  He has three adult children who live locally and one in [place].  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  ADVANCE DIRECTIVES:  Not on file  CODE STATUS: DNR/DNI (MOST form signed on 04/24/23)  PAST MEDICAL HISTORY: Past Medical History:  Diagnosis Date   Asthma    COPD (chronic obstructive pulmonary disease) (HCC)    Hearing loss    Hypertension    Hypothyroidism    Mass of left lung    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HCC)    Prostate enlargement    Shortness of  breath    Squamous cell carcinoma of lung, left (HCC)    Thyroid  disease    Tobacco abuse     PAST SURGICAL HISTORY:  Past Surgical History:  Procedure Laterality Date   CARDIOVERSION N/A    CARDIOVERSION N/A 11/24/2021   Procedure: CARDIOVERSION;  Surgeon: Mady Bruckner, MD;  Location: [facility] ORS;  Service: Cardiovascular;  Laterality: N/A;   ELECTROMAGNETIC NAVIGATION BROCHOSCOPY Left 10/19/2018   Procedure: ELECTROMAGNETIC NAVIGATION BRONCHOSCOPY LEFT;  Surgeon: Tamea Dedra CROME, MD;  Location: [facility] ORS;  Service: Cardiopulmonary;  Laterality: Left;   ENDOBRONCHIAL ULTRASOUND Left 10/19/2018   Procedure: ENDOBRONCHIAL ULTRASOUND LEFT;  Surgeon: Tamea Dedra CROME, MD;  Location: [facility] ORS;  Service: Cardiopulmonary;  Laterality: Left;   IR IMAGING GUIDED PORT INSERTION  03/03/2023   IR THORACENTESIS ASP PLEURAL SPACE W/IMG GUIDE  03/01/2023   MELANOMA EXCISION Left    NO PAST SURGERIES      HEMATOLOGY/ONCOLOGY HISTORY:  Oncology History  Squamous cell carcinoma lung, left (HCC)  10/27/2018 Initial Diagnosis   Squamous cell carcinoma lung, left (HCC)   10/27/2018 Cancer Staging   Staging form: Lung, AJCC 8th Edition - Clinical stage from 10/27/2018: Stage IIB (cT1c, cN1, cM0) - Signed by [name] [name], MD on 11/16/2018   11/27/2018 - 01/09/2019 Chemotherapy   Patient is on Treatment Plan : LUNG Carboplatin  / Paclitaxel  + XRT q7d     Small cell lung cancer, left (HCC)  02/24/2023 Initial Diagnosis   Small cell lung cancer, left (HCC)   02/24/2023 Cancer Staging   Staging form: Lung, AJCC 8th Edition -  Clinical stage from 02/24/2023: Stage IVA (cT4, cN2, cM1a) - Signed by [name] [name], MD on 02/24/2023   03/06/2023 -  Chemotherapy   Patient is on Treatment Plan : LUNG SCLC Carboplatin  + Etoposide  + Atezolizumab  Induction q21d x 4 cycles / Atezolizumab  Maintenance q21d       ALLERGIES:  has no known allergies.  MEDICATIONS:  Current Outpatient Medications   Medication Sig Dispense Refill   albuterol  (PROVENTIL  HFA;VENTOLIN  HFA) 108 (90 Base) MCG/ACT inhaler Inhale 2 puffs into the lungs every 6 (six) hours as needed for wheezing or shortness of breath. 1 Inhaler 0   albuterol  (PROVENTIL ) (2.5 MG/3ML) 0.083% nebulizer solution Take 2.5 mg by nebulization every 6 (six) hours as needed for wheezing or shortness of breath.     apixaban  (ELIQUIS ) 5 MG TABS tablet Take 2.5 mg by mouth 2 (two) times daily.     carboxymethylcellulose (REFRESH PLUS) 0.5 % SOLN 1 drop 4 (four) times daily as needed.     finasteride (PROSCAR) 5 MG tablet Take 5 mg by mouth daily.     fluticasone -salmeterol (ADVAIR) 250-50 MCG/ACT AEPB Inhale 1 puff into the lungs in the morning and at bedtime.     glimepiride  (AMARYL ) 2 MG tablet Take 2 mg by mouth daily with breakfast.     levothyroxine  (SYNTHROID ) 200 MCG tablet Take 200 mcg by mouth daily before breakfast.     lidocaine -prilocaine  (EMLA ) cream Apply to affected area once 30 g 3   ondansetron  (ZOFRAN ) 8 MG tablet Take 1 tablet (8 mg total) by mouth every 8 (eight) hours as needed for nausea, vomiting or refractory nausea / vomiting. Start on the third day after carboplatin . 60 tablet 2   Oxycodone  HCl 10 MG TABS Take 1 tablet (10 mg total) by mouth every 6 (six) hours as needed. Okay to cut tablet in half if needed. 60 tablet 0   OXYGEN Inhale 3 L into the lungs continuous.     potassium chloride  SA (KLOR-CON  M) 20 MEQ tablet Take 1 tablet (20 mEq total) by mouth 2 (two) times daily. 60 tablet 1   prochlorperazine  (COMPAZINE ) 10 MG tablet Take 1 tablet (10 mg total) by mouth every 6 (six) hours as needed for nausea or vomiting. (Patient not taking: Reported on 05/15/2023) 60 tablet 2   SEMAGLUTIDE,0.25 OR 0.5MG /DOS, [place] Inject 0.5 mg into the skin once a week.     tamsulosin  (FLOMAX ) 0.4 MG CAPS capsule 0.8 mg daily.     Tiotropium Bromide Monohydrate  (SPIRIVA  RESPIMAT) 2.5 MCG/ACT AERS Inhale 2 puffs into the lungs daily. 1  each 11   torsemide  (DEMADEX ) 20 MG tablet TAKE 1 TABLET BY MOUTH EVERY OTHER DAY 45 tablet 2   vitamin B-12 (CYANOCOBALAMIN ) 1000 MCG tablet Take 1,000 mcg by mouth daily.     No current facility-administered medications for this visit.   Facility-Administered Medications Ordered in Other Visits  Medication Dose Route Frequency Provider Last Rate Last Admin   0.9 %  sodium chloride  infusion   Intravenous Continuous Finnegan, Timothy J, MD 10 mL/hr at 04/05/23 1305 New Bag at 04/05/23 1305   0.9 %  sodium chloride  infusion   Intravenous Continuous Finnegan, Timothy J, MD 10 mL/hr at 05/15/23 0946 New Bag at 05/15/23 0946   atezolizumab  (TECENTRIQ ) 1,200 mg in sodium chloride  0.9 % 250 mL chemo infusion  1,200 mg Intravenous Once Finnegan, Timothy J, MD       CARBOplatin  (PARAPLATIN ) 550 mg in sodium chloride  0.9 % 250 mL chemo  infusion  550 mg Intravenous Once Finnegan, Timothy J, MD       dexamethasone  (DECADRON ) injection 10 mg  10 mg Intravenous Once Finnegan, Timothy J, MD       diphenhydrAMINE  (BENADRYL ) injection 25 mg  25 mg Intravenous Once Finnegan, Timothy J, MD       etoposide  (VEPESID ) 250 mg in sodium chloride  0.9 % 1,000 mL chemo infusion  100 mg/m2 (Treatment Plan Recorded) Intravenous Once Finnegan, Timothy J, MD       famotidine  (PEPCID ) IVPB 20 mg premix  20 mg Intravenous Once Finnegan, Timothy J, MD       fosaprepitant  (EMEND) 150 mg in sodium chloride  0.9 % 145 mL IVPB  150 mg Intravenous Once Finnegan, Timothy J, MD       heparin  lock flush 100 unit/mL  500 Units Intracatheter Once PRN Finnegan, Timothy J, MD       magnesium  sulfate IVPB 4 g 100 mL  4 g Intravenous Once Finnegan, Timothy J, MD 50 mL/hr at 05/15/23 0957 4 g at 05/15/23 0957   palonosetron  (ALOXI ) injection 0.25 mg  0.25 mg Intravenous Once Finnegan, Timothy J, MD       potassium chloride  20 mEq in 100 mL IVPB  20 mEq Intravenous Q1 Hr x 2 Finnegan, Timothy J, MD 100 mL/hr at 05/15/23 0947 20 mEq at 05/15/23  0947    VITAL SIGNS: There were no vitals taken for this visit. There were no vitals filed for this visit.  Estimated body mass index is 34.3 kg/m as calculated from the following:   Height as of an earlier encounter on 05/15/23: 6' (1.829 m).   Weight as of an earlier encounter on 05/15/23: 252 lb 14.4 oz (114.7 kg).  LABS: CBC:    Component Value Date/Time   WBC 6.1 05/15/2023 0823   WBC 6.4 02/27/2023 0838   HGB 11.3 (L) 05/15/2023 0823   HCT 34.6 (L) 05/15/2023 0823   PLT 228 05/15/2023 0823   MCV 93.3 05/15/2023 0823   MCV 90.0 10/20/2015 1020   NEUTROABS 3.9 05/15/2023 0823   LYMPHSABS 1.2 05/15/2023 0823   MONOABS 0.9 05/15/2023 0823   EOSABS 0.1 05/15/2023 0823   BASOSABS 0.1 05/15/2023 0823   Comprehensive Metabolic Panel:    Component Value Date/Time   NA 137 05/15/2023 0823   K 2.9 (L) 05/15/2023 0823   CL 102 05/15/2023 0823   CO2 23 05/15/2023 0823   BUN 21 05/15/2023 0823   CREATININE 1.19 05/15/2023 0823   CREATININE 1.29 10/31/2014 1159   GLUCOSE 130 (H) 05/15/2023 0823   CALCIUM 7.9 (L) 05/15/2023 0823   AST 19 05/15/2023 0823   ALT 10 05/15/2023 0823   ALKPHOS 65 05/15/2023 0823   BILITOT 0.7 05/15/2023 0823   PROT 5.9 (L) 05/15/2023 0823   ALBUMIN 3.1 (L) 05/15/2023 9176    RADIOGRAPHIC STUDIES: No results found.  PERFORMANCE STATUS (ECOG) : 3 - Symptomatic, >50% confined to bed  Review of Systems Unless otherwise noted, a complete review of systems is negative.  Physical Exam General: NAD Pulmonary: Unlabored, on O2 Extremities: no edema, no joint deformities Skin: no rashes Neurological: Weakness but otherwise nonfocal  IMPRESSION: Patient seen in infusion.  Accompanied by his wife.  Patient and wife feel that he is doing overall better.  Breathing and pain are stable.  No symptomatic complaints or concerns at present.  Plan is for repeat PET scan later this month  PLAN: -Continue current scope of treatment -Follow-up  telephone  visit 1 to 2 months  Case and plan discussed with Dr. [name]  Patient expressed understanding and was in agreement with this plan. He also understands that He can call the clinic at any time with any questions, concerns, or complaints.     Time Total: 15 minutes  Visit consisted of counseling and education dealing with the complex and emotionally intense issues of symptom management and palliative care in the setting of serious and potentially life-threatening illness.Greater than 50%  of this time was spent counseling and coordinating care related to the above assessment and plan.  Signed by: Fonda Mower, PhD, NP-C

## 2023-05-15 NOTE — Patient Instructions (Signed)
 [facility] - A DEPT OF [facility]  Discharge Instructions: Thank you for choosing [facility] to provide your oncology and hematology care.  If you have a lab appointment with the Cancer Center, please go directly to the Cancer Center and check in at the registration area.  Wear comfortable clothing and clothing appropriate for easy access to any Portacath or PICC line.   We strive to give you quality time with your provider. You may need to reschedule your appointment if you arrive late (15 or more minutes).  Arriving late affects you and other patients whose appointments are after yours.  Also, if you miss three or more appointments without notifying the office, you may be dismissed from the clinic at the provider's discretion.      For prescription refill requests, have your pharmacy contact our office and allow 72 hours for refills to be completed.    Today you received the following chemotherapy and/or immunotherapy agents POTASSIUM, MAGNESIUM , TECENTRIQ , CARBOPLATIN , ETOPOSIDE       To help prevent nausea and vomiting after your treatment, we encourage you to take your nausea medication as directed.  BELOW ARE SYMPTOMS THAT SHOULD BE REPORTED IMMEDIATELY: *FEVER GREATER THAN 100.4 F (38 C) OR HIGHER *CHILLS OR SWEATING *NAUSEA AND VOMITING THAT IS NOT CONTROLLED WITH YOUR NAUSEA MEDICATION *UNUSUAL SHORTNESS OF BREATH *UNUSUAL BRUISING OR BLEEDING *URINARY PROBLEMS (pain or burning when urinating, or frequent urination) *BOWEL PROBLEMS (unusual diarrhea, constipation, pain near the anus) TENDERNESS IN MOUTH AND THROAT WITH OR WITHOUT PRESENCE OF ULCERS (sore throat, sores in mouth, or a toothache) UNUSUAL RASH, SWELLING OR PAIN  UNUSUAL VAGINAL DISCHARGE OR ITCHING   Items with * indicate a potential emergency and should be followed up as soon as possible or go to the Emergency Department if any problems should occur.  Please show  the CHEMOTHERAPY ALERT CARD or IMMUNOTHERAPY ALERT CARD at check-in to the Emergency Department and triage nurse.  Should you have questions after your visit or need to cancel or reschedule your appointment, please contact [facility] - A DEPT OF [name]  [phone] and follow the prompts.  Office hours are 8:00 a.m. to 4:30 p.m. Monday - Friday. Please note that voicemails left after 4:00 p.m. may not be returned until the following business day.  We are closed weekends and major holidays. You have access to a nurse at all times for urgent questions. Please call the main number to the clinic [phone] and follow the prompts.  For any non-urgent questions, you may also contact your provider using MyChart. We now offer e-Visits for anyone 53 and older to request care online for non-urgent symptoms. For details visit [email or web address].   Also download the MyChart app! Go to the app store, search MyChart, open the app, select [place], and log in with your MyChart username and password.  Magnesium  Sulfate Injection What is this medication? MAGNESIUM  SULFATE (mag NEE zee um SUL fate) prevents and treats low levels of magnesium  in your body. It may also be used to prevent and treat seizures during pregnancy in people with high blood pressure disorders, such as preeclampsia or eclampsia. Magnesium  plays an important role in maintaining the health of your muscles and nervous system. This medicine may be used for other purposes; ask your health care provider or pharmacist if you have questions. What should I tell my care team before I take this  medication? They need to know if you have any of these conditions: Heart disease History of irregular heart beat Kidney disease An unusual or allergic reaction to magnesium  sulfate, medications, foods, dyes, or preservatives Pregnant or trying to get pregnant Breast-feeding How should I use this  medication? This medication is for infusion into a vein. It is given in a hospital or clinic setting. Talk to your care team about the use of this medication in children. While this medication may be prescribed for selected conditions, precautions do apply. Overdosage: If you think you have taken too much of this medicine contact a poison control center or emergency room at once. NOTE: This medicine is only for you. Do not share this medicine with others. What if I miss a dose? This does not apply. What may interact with this medication? Certain medications for anxiety or sleep Certain medications for seizures, such phenobarbital Digoxin Medications that relax muscles for surgery Narcotic medications for pain This list may not describe all possible interactions. Give your health care provider a list of all the medicines, herbs, non-prescription drugs, or dietary supplements you use. Also tell them if you smoke, drink alcohol, or use illegal drugs. Some items may interact with your medicine. What should I watch for while using this medication? Your condition will be monitored carefully while you are receiving this medication. You may need blood work done while you are receiving this medication. What side effects may I notice from receiving this medication? Side effects that you should report to your care team as soon as possible: Allergic reactions--skin rash, itching, hives, swelling of the face, lips, tongue, or throat High magnesium  level--confusion, drowsiness, facial flushing, redness, sweating, muscle weakness, fast or irregular heartbeat, trouble breathing Low blood pressure--dizziness, feeling faint or lightheaded, blurry vision Side effects that usually do not require medical attention (report to your care team if they continue or are bothersome): Headache Nausea This list may not describe all possible side effects. Call your doctor for medical advice about side effects. You may  report side effects to FDA at 1-800-FDA-1088. Where should I keep my medication? This medication is given in a hospital or clinic and will not be stored at home. NOTE: This sheet is a summary. It may not cover all possible information. If you have questions about this medicine, talk to your doctor, pharmacist, or health care provider.  2024 Elsevier/Gold Standard (2020-12-30 00:00:00)  Potassium Chloride  Injection What is this medication? POTASSIUM CHLORIDE  (poe TASS i um KLOOR ide) prevents and treats low levels of potassium in your body. Potassium plays an important role in maintaining the health of your kidneys, heart, muscles, and nervous system. This medicine may be used for other purposes; ask your health care provider or pharmacist if you have questions. COMMON BRAND NAME(S): PROAMP What should I tell my care team before I take this medication? They need to know if you have any of these conditions: Addison disease Dehydration Diabetes (high blood sugar) Heart disease High levels of potassium in the blood Irregular heartbeat or rhythm Kidney disease Large areas of burned skin An unusual or allergic reaction to potassium, other medications, foods, dyes, or preservatives Pregnant or trying to get pregnant Breast-feeding How should I use this medication? This medication is injected into a vein. It is given in a hospital or clinic setting. Talk to your care team about the use of this medication in children. Special care may be needed. Overdosage: If you think you have taken too much of this  medicine contact a poison control center or emergency room at once. NOTE: This medicine is only for you. Do not share this medicine with others. What if I miss a dose? This does not apply. This medication is not for regular use. What may interact with this medication? Do not take this medication with any of the following: Certain diuretics, such as spironolactone,  triamterene Eplerenone Sodium polystyrene sulfonate This medication may also interact with the following: Certain medications for blood pressure or heart disease, such as lisinopril, losartan, quinapril, valsartan Medications that lower your chance of fighting infection, such as cyclosporine, tacrolimus NSAIDs, medications for pain and inflammation, such as ibuprofen or naproxen  Other potassium supplements Salt substitutes This list may not describe all possible interactions. Give your health care provider a list of all the medicines, herbs, non-prescription drugs, or dietary supplements you use. Also tell them if you smoke, drink alcohol, or use illegal drugs. Some items may interact with your medicine. What should I watch for while using this medication? Visit your care team for regular checks on your progress. Tell your care team if your symptoms do not start to get better or if they get worse. You may need blood work while you are taking this medication. Avoid salt substitutes unless you are told otherwise by your care team. What side effects may I notice from receiving this medication? Side effects that you should report to your care team as soon as possible: Allergic reactions--skin rash, itching, hives, swelling of the face, lips, tongue, or throat High potassium level--muscle weakness, fast or irregular heartbeat Side effects that usually do not require medical attention (report to your care team if they continue or are bothersome): Diarrhea Nausea Stomach pain Vomiting This list may not describe all possible side effects. Call your doctor for medical advice about side effects. You may report side effects to FDA at 1-800-FDA-1088. Where should I keep my medication? This medication is given in a hospital or clinic. It will not be stored at home. NOTE: This sheet is a summary. It may not cover all possible information. If you have questions about this medicine, talk to your doctor,  pharmacist, or health care provider.  2024 Elsevier/Gold Standard (2021-10-29 00:00:00)  Atezolizumab  Injection What is this medication? ATEZOLIZUMAB  (a te zoe LIZ ue mab) treats some types of cancer. It works by helping your immune system slow or stop the spread of cancer cells. It is a monoclonal antibody. This medicine may be used for other purposes; ask your health care provider or pharmacist if you have questions. COMMON BRAND NAME(S): Tecentriq  What should I tell my care team before I take this medication? They need to know if you have any of these conditions: Allogeneic stem cell transplant (uses someone else's stem cells) Autoimmune diseases, such as Crohn disease, ulcerative colitis, lupus History of chest radiation Nervous system problems, such as Guillain-Barre syndrome, myasthenia gravis Organ transplant An unusual or allergic reaction to atezolizumab , other medications, foods, dyes, or preservatives Pregnant or trying to get pregnant Breast-feeding How should I use this medication? This medication is injected into a vein. It is given by your care team in a hospital or clinic setting. A special MedGuide will be given to you before each treatment. Be sure to read this information carefully each time. Talk to your care team about the use of this medication in children. While it may be prescribed for children as young as 2 years for selected conditions, precautions do apply. Overdosage: If you think you  have taken too much of this medicine contact a poison control center or emergency room at once. NOTE: This medicine is only for you. Do not share this medicine with others. What if I miss a dose? Keep appointments for follow-up doses. It is important not to miss your dose. Call your care team if you are unable to keep an appointment. What may interact with this medication? Interactions have not been studied. This list may not describe all possible interactions. Give your health  care provider a list of all the medicines, herbs, non-prescription drugs, or dietary supplements you use. Also tell them if you smoke, drink alcohol, or use illegal drugs. Some items may interact with your medicine. What should I watch for while using this medication? Your condition will be monitored carefully while you are receiving this medication. You may need blood work while taking this medication. This medication may cause serious skin reactions. They can happen weeks to months after starting the medication. Contact your care team right away if you notice fevers or flu-like symptoms with a rash. The rash may be red or purple and then turn into blisters or peeling of the skin. You may also notice a red rash with swelling of the face, lips, or lymph nodes in your neck or under your arms. Tell your care team right away if you have any change in your eyesight. Talk to your care team if you may be pregnant. Serious birth defects can occur if you take this medication during pregnancy and for 5 months after the last dose. You will need a negative pregnancy test before starting this medication. Contraception is recommended while taking this medication and for 5 months after the last dose. Your care team can help you find the option that works for you. Do not breastfeed while taking this medication and for at least 5 months after the last dose. What side effects may I notice from receiving this medication? Side effects that you should report to your doctor or health care professional as soon as possible: Allergic reactions--skin rash, itching, hives, swelling of the face, lips, tongue, or throat Dry cough, shortness of breath or trouble breathing Eye pain, redness, irritation, or discharge with blurry or decreased vision Heart muscle inflammation--unusual weakness or fatigue, shortness of breath, chest pain, fast or irregular heartbeat, dizziness, swelling of the ankles, feet, or hands Hormone gland  problems--headache, sensitivity to light, unusual weakness or fatigue, dizziness, fast or irregular heartbeat, increased sensitivity to cold or heat, excessive sweating, constipation, hair loss, increased thirst or amount of urine, tremors or shaking, irritability Infusion reactions--chest pain, shortness of breath or trouble breathing, feeling faint or lightheaded Kidney injury (glomerulonephritis)--decrease in the amount of urine, red or dark brown urine, foamy or bubbly urine, swelling of the ankles, hands, or feet Liver injury--right upper belly pain, loss of appetite, nausea, light-colored stool, dark yellow or brown urine, yellowing skin or eyes, unusual weakness or fatigue Pain, tingling, or numbness in the hands or feet, muscle weakness, change in vision, confusion or trouble speaking, loss of balance or coordination, trouble walking, seizures Rash, fever, and swollen lymph nodes Redness, blistering, peeling, or loosening of the skin, including inside the mouth Sudden or severe stomach pain, bloody diarrhea, fever, nausea, vomiting Side effects that usually do not require medical attention (report to your doctor or health care professional if they continue or are bothersome): Bone, joint, or muscle pain Diarrhea Fatigue Loss of appetite Nausea Skin rash This list may not describe all possible  side effects. Call your doctor for medical advice about side effects. You may report side effects to FDA at 1-800-FDA-1088. Where should I keep my medication? This medication is given in a hospital or clinic. It will not be stored at home. NOTE: This sheet is a summary. It may not cover all possible information. If you have questions about this medicine, talk to your doctor, pharmacist, or health care provider.  2024 Elsevier/Gold Standard (2021-09-03 00:00:00)  Carboplatin  Injection What is this medication? CARBOPLATIN  (KAR boe pla tin) treats some types of cancer. It works by slowing down the  growth of cancer cells. This medicine may be used for other purposes; ask your health care provider or pharmacist if you have questions. COMMON BRAND NAME(S): Paraplatin  What should I tell my care team before I take this medication? They need to know if you have any of these conditions: Blood disorders Hearing problems Kidney disease Recent or ongoing radiation therapy An unusual or allergic reaction to carboplatin , cisplatin, other medications, foods, dyes, or preservatives Pregnant or trying to get pregnant Breast-feeding How should I use this medication? This medication is injected into a vein. It is given by your care team in a hospital or clinic setting. Talk to your care team about the use of this medication in children. Special care may be needed. Overdosage: If you think you have taken too much of this medicine contact a poison control center or emergency room at once. NOTE: This medicine is only for you. Do not share this medicine with others. What if I miss a dose? Keep appointments for follow-up doses. It is important not to miss your dose. Call your care team if you are unable to keep an appointment. What may interact with this medication? Medications for seizures Some antibiotics, such as amikacin, gentamicin, neomycin, streptomycin, tobramycin Vaccines This list may not describe all possible interactions. Give your health care provider a list of all the medicines, herbs, non-prescription drugs, or dietary supplements you use. Also tell them if you smoke, drink alcohol, or use illegal drugs. Some items may interact with your medicine. What should I watch for while using this medication? Your condition will be monitored carefully while you are receiving this medication. You may need blood work while taking this medication. This medication may make you feel generally unwell. This is not uncommon, as chemotherapy can affect healthy cells as well as cancer cells. Report any side  effects. Continue your course of treatment even though you feel ill unless your care team tells you to stop. In some cases, you may be given additional medications to help with side effects. Follow all directions for their use. This medication may increase your risk of getting an infection. Call your care team for advice if you get a fever, chills, sore throat, or other symptoms of a cold or flu. Do not treat yourself. Try to avoid being around people who are sick. Avoid taking medications that contain aspirin, acetaminophen , ibuprofen, naproxen , or ketoprofen unless instructed by your care team. These medications may hide a fever. Be careful brushing or flossing your teeth or using a toothpick because you may get an infection or bleed more easily. If you have any dental work done, tell your dentist you are receiving this medication. Talk to your care team if you wish to become pregnant or think you might be pregnant. This medication can cause serious birth defects. Talk to your care team about effective forms of contraception. Do not breast-feed while taking this medication.  What side effects may I notice from receiving this medication? Side effects that you should report to your care team as soon as possible: Allergic reactions--skin rash, itching, hives, swelling of the face, lips, tongue, or throat Infection--fever, chills, cough, sore throat, wounds that don't heal, pain or trouble when passing urine, general feeling of discomfort or being unwell Low red blood cell level--unusual weakness or fatigue, dizziness, headache, trouble breathing Pain, tingling, or numbness in the hands or feet, muscle weakness, change in vision, confusion or trouble speaking, loss of balance or coordination, trouble walking, seizures Unusual bruising or bleeding Side effects that usually do not require medical attention (report to your care team if they continue or are bothersome): Hair loss Nausea Unusual weakness or  fatigue Vomiting This list may not describe all possible side effects. Call your doctor for medical advice about side effects. You may report side effects to FDA at 1-800-FDA-1088. Where should I keep my medication? This medication is given in a hospital or clinic. It will not be stored at home. NOTE: This sheet is a summary. It may not cover all possible information. If you have questions about this medicine, talk to your doctor, pharmacist, or health care provider.  2024 Elsevier/Gold Standard (2021-08-10 00:00:00)   Etoposide  Injection What is this medication? ETOPOSIDE  (e toe POE side) treats some types of cancer. It works by slowing down the growth of cancer cells. This medicine may be used for other purposes; ask your health care provider or pharmacist if you have questions. COMMON BRAND NAME(S): Etopophos, Toposar , VePesid  What should I tell my care team before I take this medication? They need to know if you have any of these conditions: Infection Kidney disease Liver disease Low blood counts, such as low white cell, platelet, red cell counts An unusual or allergic reaction to etoposide , other medications, foods, dyes, or preservatives If you or your partner are pregnant or trying to get pregnant Breastfeeding How should I use this medication? This medication is injected into a vein. It is given by your care team in a hospital or clinic setting. Talk to your care team about the use of this medication in children. Special care may be needed. Overdosage: If you think you have taken too much of this medicine contact a poison control center or emergency room at once. NOTE: This medicine is only for you. Do not share this medicine with others. What if I miss a dose? Keep appointments for follow-up doses. It is important not to miss your dose. Call your care team if you are unable to keep an appointment. What may interact with this medication? Warfarin This list may not describe  all possible interactions. Give your health care provider a list of all the medicines, herbs, non-prescription drugs, or dietary supplements you use. Also tell them if you smoke, drink alcohol, or use illegal drugs. Some items may interact with your medicine. What should I watch for while using this medication? Your condition will be monitored carefully while you are receiving this medication. This medication may make you feel generally unwell. This is not uncommon as chemotherapy can affect healthy cells as well as cancer cells. Report any side effects. Continue your course of treatment even though you feel ill unless your care team tells you to stop. This medication can cause serious side effects. To reduce the risk, your care team may give you other medications to take before receiving this one. Be sure to follow the directions from your care team. This  medication may increase your risk of getting an infection. Call your care team for advice if you get a fever, chills, sore throat, or other symptoms of a cold or flu. Do not treat yourself. Try to avoid being around people who are sick. This medication may increase your risk to bruise or bleed. Call your care team if you notice any unusual bleeding. Talk to your care team about your risk of cancer. You may be more at risk for certain types of cancers if you take this medication. Talk to your care team if you may be pregnant. Serious birth defects can occur if you take this medication during pregnancy and for 6 months after the last dose. You will need a negative pregnancy test before starting this medication. Contraception is recommended while taking this medication and for 6 months after the last dose. Your care team can help you find the option that works for you. If your partner can get pregnant, use a condom during sex while taking this medication and for 4 months after the last dose. Do not breastfeed while taking this medication. This medication  may cause infertility. Talk to your care team if you are concerned about your fertility. What side effects may I notice from receiving this medication? Side effects that you should report to your care team as soon as possible: Allergic reactions--skin rash, itching, hives, swelling of the face, lips, tongue, or throat Infection--fever, chills, cough, sore throat, wounds that don't heal, pain or trouble when passing urine, general feeling of discomfort or being unwell Low red blood cell level--unusual weakness or fatigue, dizziness, headache, trouble breathing Unusual bruising or bleeding Side effects that usually do not require medical attention (report to your care team if they continue or are bothersome): Diarrhea Fatigue Hair loss Loss of appetite Nausea Vomiting This list may not describe all possible side effects. Call your doctor for medical advice about side effects. You may report side effects to FDA at 1-800-FDA-1088. Where should I keep my medication? This medication is given in a hospital or clinic. It will not be stored at home. NOTE: This sheet is a summary. It may not cover all possible information. If you have questions about this medicine, talk to your doctor, pharmacist, or health care provider.  2024 Elsevier/Gold Standard (2021-09-09 00:00:00)

## 2023-05-16 ENCOUNTER — Inpatient Hospital Stay: Payer: Medicare Other

## 2023-05-16 ENCOUNTER — Ambulatory Visit: Payer: Medicare Other

## 2023-05-16 VITALS — BP 119/59 | HR 66 | Temp 97.6°F | Resp 20

## 2023-05-16 DIAGNOSIS — C3492 Malignant neoplasm of unspecified part of left bronchus or lung: Secondary | ICD-10-CM

## 2023-05-16 DIAGNOSIS — Z5111 Encounter for antineoplastic chemotherapy: Secondary | ICD-10-CM | POA: Diagnosis not present

## 2023-05-16 MED ORDER — DEXAMETHASONE SODIUM PHOSPHATE 10 MG/ML IJ SOLN
10.0000 mg | Freq: Once | INTRAMUSCULAR | Status: AC
  Administered 2023-05-16: 10 mg via INTRAVENOUS
  Filled 2023-05-16: qty 1

## 2023-05-16 MED ORDER — HEPARIN SOD (PORK) LOCK FLUSH 100 UNIT/ML IV SOLN
250.0000 [IU] | Freq: Once | INTRAVENOUS | Status: AC | PRN
  Administered 2023-05-16: 250 [IU]
  Filled 2023-05-16: qty 5

## 2023-05-16 MED ORDER — SODIUM CHLORIDE 0.9 % IV SOLN
INTRAVENOUS | Status: DC
  Filled 2023-05-16: qty 250

## 2023-05-16 MED ORDER — SODIUM CHLORIDE 0.9 % IV SOLN
100.0000 mg/m2 | Freq: Once | INTRAVENOUS | Status: AC
  Administered 2023-05-16: 250 mg via INTRAVENOUS
  Filled 2023-05-16: qty 12.5

## 2023-05-16 NOTE — Progress Notes (Signed)
 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  Patient receiving carboplatin , etoposide , and atezolizumab .  Met with patient during infusion.  Hard of hearing so wanted wife to give information.  Wife reports that appetite is good and patient is eating but eating less than usual which is a good thing.  Ate 4 slices of bacon, 2 eggs, grits and 2 pieces of toast this am.  Last night for dinner ate chef salad with boiled egg, ranch dressing and 1/2 sleeve of ritz crackers.  Reports shortness of breath on walking.  Snacks on candy, fruit, chex mix.  Denies significant nutrition impact symptoms   Medications: KCL  Labs: K 2.9, glucose 130, Mag 1.1  Anthropometrics:   Weight 252 lb 14.4 oz   253 lb 1.6 oz on 12/13 271 lb on 11/10/22 (fluid weight gain)   NUTRITION DIAGNOSIS: Unintentional weight loss stable    INTERVENTION:  Continue well balanced diet focusing on lean proteins and plant foods Wife has RD's contact information and will contact if needed in the future    NEXT VISIT: no follow-up RD available if needed  Levi [name] B. Dasie, RD, LDN Registered Dietitian [phone]

## 2023-05-16 NOTE — Patient Instructions (Signed)
 [facility] - A DEPT OF [facility]  Discharge Instructions: Thank you for choosing [facility] to provide your oncology and hematology care.  If you have a lab appointment with the Cancer Center, please go directly to the Cancer Center and check in at the registration area.  Wear comfortable clothing and clothing appropriate for easy access to any Portacath or PICC line.   We strive to give you quality time with your provider. You may need to reschedule your appointment if you arrive late (15 or more minutes).  Arriving late affects you and other patients whose appointments are after yours.  Also, if you miss three or more appointments without notifying the office, you may be dismissed from the clinic at the provider's discretion.      For prescription refill requests, have your pharmacy contact our office and allow 72 hours for refills to be completed.    Today you received the following chemotherapy and/or immunotherapy agents Etoposide      To help prevent nausea and vomiting after your treatment, we encourage you to take your nausea medication as directed.  BELOW ARE SYMPTOMS THAT SHOULD BE REPORTED IMMEDIATELY: *FEVER GREATER THAN 100.4 F (38 C) OR HIGHER *CHILLS OR SWEATING *NAUSEA AND VOMITING THAT IS NOT CONTROLLED WITH YOUR NAUSEA MEDICATION *UNUSUAL SHORTNESS OF BREATH *UNUSUAL BRUISING OR BLEEDING *URINARY PROBLEMS (pain or burning when urinating, or frequent urination) *BOWEL PROBLEMS (unusual diarrhea, constipation, pain near the anus) TENDERNESS IN MOUTH AND THROAT WITH OR WITHOUT PRESENCE OF ULCERS (sore throat, sores in mouth, or a toothache) UNUSUAL RASH, SWELLING OR PAIN  UNUSUAL VAGINAL DISCHARGE OR ITCHING   Items with * indicate a potential emergency and should be followed up as soon as possible or go to the Emergency Department if any problems should occur.  Please show the CHEMOTHERAPY ALERT CARD or IMMUNOTHERAPY  ALERT CARD at check-in to the Emergency Department and triage nurse.  Should you have questions after your visit or need to cancel or reschedule your appointment, please contact [facility] - A DEPT OF [facility]  [phone] and follow the prompts.  Office hours are 8:00 a.m. to 4:30 p.m. Monday - Friday. Please note that voicemails left after 4:00 p.m. may not be returned until the following business day.  We are closed weekends and major holidays. You have access to a nurse at all times for urgent questions. Please call the main number to the clinic [phone] and follow the prompts.  For any non-urgent questions, you may also contact your provider using MyChart. We now offer e-Visits for anyone 36 and older to request care online for non-urgent symptoms. For details visit [email or web address].   Also download the MyChart app! Go to the app store, search "MyChart", open the app, select [facility], and log in with your MyChart username and password.

## 2023-05-17 ENCOUNTER — Inpatient Hospital Stay: Payer: Medicare Other

## 2023-05-17 VITALS — BP 129/70 | HR 67 | Resp 20

## 2023-05-17 DIAGNOSIS — Z5111 Encounter for antineoplastic chemotherapy: Secondary | ICD-10-CM | POA: Diagnosis not present

## 2023-05-17 DIAGNOSIS — C3492 Malignant neoplasm of unspecified part of left bronchus or lung: Secondary | ICD-10-CM

## 2023-05-17 MED ORDER — ETOPOSIDE CHEMO INJECTION 1 GM/50ML
100.0000 mg/m2 | Freq: Once | INTRAVENOUS | Status: AC
  Administered 2023-05-17: 250 mg via INTRAVENOUS
  Filled 2023-05-17: qty 12.5

## 2023-05-17 MED ORDER — DEXAMETHASONE SODIUM PHOSPHATE 10 MG/ML IJ SOLN
10.0000 mg | Freq: Once | INTRAMUSCULAR | Status: AC
Start: 2023-05-17 — End: 2023-05-17
  Administered 2023-05-17: 10 mg via INTRAVENOUS
  Filled 2023-05-17: qty 1

## 2023-05-17 MED ORDER — SODIUM CHLORIDE 0.9 % IV SOLN
INTRAVENOUS | Status: DC
  Filled 2023-05-17: qty 250

## 2023-05-17 NOTE — Patient Instructions (Signed)
 [facility] - A DEPT OF [facility]  Discharge Instructions: Thank you for choosing [facility] to provide your oncology and hematology care.  If you have a lab appointment with the Cancer Center, please go directly to the Cancer Center and check in at the registration area.  Wear comfortable clothing and clothing appropriate for easy access to any Portacath or PICC line.   We strive to give you quality time with your provider. You may need to reschedule your appointment if you arrive late (15 or more minutes).  Arriving late affects you and other patients whose appointments are after yours.  Also, if you miss three or more appointments without notifying the office, you may be dismissed from the clinic at the provider's discretion.      For prescription refill requests, have your pharmacy contact our office and allow 72 hours for refills to be completed.    Today you received the following chemotherapy and/or immunotherapy agents Etoposide      To help prevent nausea and vomiting after your treatment, we encourage you to take your nausea medication as directed.  BELOW ARE SYMPTOMS THAT SHOULD BE REPORTED IMMEDIATELY: *FEVER GREATER THAN 100.4 F (38 C) OR HIGHER *CHILLS OR SWEATING *NAUSEA AND VOMITING THAT IS NOT CONTROLLED WITH YOUR NAUSEA MEDICATION *UNUSUAL SHORTNESS OF BREATH *UNUSUAL BRUISING OR BLEEDING *URINARY PROBLEMS (pain or burning when urinating, or frequent urination) *BOWEL PROBLEMS (unusual diarrhea, constipation, pain near the anus) TENDERNESS IN MOUTH AND THROAT WITH OR WITHOUT PRESENCE OF ULCERS (sore throat, sores in mouth, or a toothache) UNUSUAL RASH, SWELLING OR PAIN  UNUSUAL VAGINAL DISCHARGE OR ITCHING   Items with * indicate a potential emergency and should be followed up as soon as possible or go to the Emergency Department if any problems should occur.  Please show the CHEMOTHERAPY ALERT CARD or IMMUNOTHERAPY  ALERT CARD at check-in to the Emergency Department and triage nurse.  Should you have questions after your visit or need to cancel or reschedule your appointment, please contact [facility] - A DEPT OF [facility]  [phone] and follow the prompts.  Office hours are 8:00 a.m. to 4:30 p.m. Monday - Friday. Please note that voicemails left after 4:00 p.m. may not be returned until the following business day.  We are closed weekends and major holidays. You have access to a nurse at all times for urgent questions. Please call the main number to the clinic [phone] and follow the prompts.  For any non-urgent questions, you may also contact your provider using MyChart. We now offer e-Visits for anyone 36 and older to request care online for non-urgent symptoms. For details visit [email or web address].   Also download the MyChart app! Go to the app store, search "MyChart", open the app, select [facility], and log in with your MyChart username and password.

## 2023-05-19 ENCOUNTER — Inpatient Hospital Stay: Payer: Medicare Other

## 2023-05-19 VITALS — BP 110/68

## 2023-05-19 DIAGNOSIS — C3492 Malignant neoplasm of unspecified part of left bronchus or lung: Secondary | ICD-10-CM

## 2023-05-19 DIAGNOSIS — Z5111 Encounter for antineoplastic chemotherapy: Secondary | ICD-10-CM | POA: Diagnosis not present

## 2023-05-19 MED ORDER — PEGFILGRASTIM-CBQV 6 MG/0.6ML ~~LOC~~ SOSY
6.0000 mg | PREFILLED_SYRINGE | Freq: Once | SUBCUTANEOUS | Status: AC
Start: 2023-05-19 — End: 2023-05-19
  Administered 2023-05-19: 6 mg via SUBCUTANEOUS
  Filled 2023-05-19: qty 0.6

## 2023-05-29 ENCOUNTER — Ambulatory Visit
Admission: RE | Admit: 2023-05-29 | Discharge: 2023-05-29 | Disposition: A | Discharge status: Home / Self Care | Payer: Medicare Other | Source: Ambulatory Visit | Attending: Oncology | Admitting: Oncology

## 2023-05-29 DIAGNOSIS — I517 Cardiomegaly: Secondary | ICD-10-CM | POA: Insufficient documentation

## 2023-05-29 DIAGNOSIS — I2510 Atherosclerotic heart disease of native coronary artery without angina pectoris: Secondary | ICD-10-CM | POA: Insufficient documentation

## 2023-05-29 DIAGNOSIS — J320 Chronic maxillary sinusitis: Secondary | ICD-10-CM | POA: Diagnosis not present

## 2023-05-29 DIAGNOSIS — C3412 Malignant neoplasm of upper lobe, left bronchus or lung: Secondary | ICD-10-CM | POA: Insufficient documentation

## 2023-05-29 DIAGNOSIS — J439 Emphysema, unspecified: Secondary | ICD-10-CM | POA: Insufficient documentation

## 2023-05-29 DIAGNOSIS — I7091 Generalized atherosclerosis: Secondary | ICD-10-CM | POA: Diagnosis not present

## 2023-05-29 DIAGNOSIS — I700 Atherosclerosis of aorta: Secondary | ICD-10-CM | POA: Diagnosis not present

## 2023-05-29 DIAGNOSIS — K5730 Diverticulosis of large intestine without perforation or abscess without bleeding: Secondary | ICD-10-CM | POA: Insufficient documentation

## 2023-05-29 DIAGNOSIS — C3492 Malignant neoplasm of unspecified part of left bronchus or lung: Secondary | ICD-10-CM | POA: Diagnosis present

## 2023-05-29 DIAGNOSIS — Z9221 Personal history of antineoplastic chemotherapy: Secondary | ICD-10-CM | POA: Diagnosis not present

## 2023-05-29 LAB — GLUCOSE, CAPILLARY: Glucose-Capillary: 114 mg/dL — ABNORMAL HIGH (ref 70–99)

## 2023-05-29 MED ORDER — FLUDEOXYGLUCOSE F - 18 (FDG) INJECTION
12.0000 | Freq: Once | INTRAVENOUS | Status: AC | PRN
  Administered 2023-05-29: 12.89 via INTRAVENOUS

## 2023-06-01 ENCOUNTER — Encounter: Payer: Self-pay | Admitting: Oncology

## 2023-06-05 ENCOUNTER — Inpatient Hospital Stay: Payer: Medicare Other | Attending: Oncology

## 2023-06-05 ENCOUNTER — Encounter: Payer: Self-pay | Admitting: Oncology

## 2023-06-05 ENCOUNTER — Inpatient Hospital Stay (HOSPITAL_BASED_OUTPATIENT_CLINIC_OR_DEPARTMENT_OTHER): Payer: Medicare Other | Admitting: Oncology

## 2023-06-05 ENCOUNTER — Inpatient Hospital Stay: Payer: Medicare Other

## 2023-06-05 ENCOUNTER — Other Ambulatory Visit: Payer: Medicare Other

## 2023-06-05 VITALS — BP 116/60 | HR 71 | Temp 98.2°F | Resp 18 | Ht 72.0 in | Wt 255.0 lb

## 2023-06-05 VITALS — BP 110/61 | HR 56 | Temp 97.3°F | Resp 19

## 2023-06-05 DIAGNOSIS — C3492 Malignant neoplasm of unspecified part of left bronchus or lung: Secondary | ICD-10-CM

## 2023-06-05 DIAGNOSIS — Z79620 Long term (current) use of immunosuppressive biologic: Secondary | ICD-10-CM | POA: Diagnosis not present

## 2023-06-05 DIAGNOSIS — C3412 Malignant neoplasm of upper lobe, left bronchus or lung: Secondary | ICD-10-CM | POA: Diagnosis present

## 2023-06-05 DIAGNOSIS — Z5112 Encounter for antineoplastic immunotherapy: Secondary | ICD-10-CM | POA: Insufficient documentation

## 2023-06-05 LAB — CBC WITH DIFFERENTIAL (CANCER CENTER ONLY)
Abs Immature Granulocytes: 0.15 10*3/uL — ABNORMAL HIGH (ref 0.00–0.07)
Basophils Absolute: 0.1 10*3/uL (ref 0.0–0.1)
Basophils Relative: 1 %
Eosinophils Absolute: 0 10*3/uL (ref 0.0–0.5)
Eosinophils Relative: 1 %
HCT: 29.2 % — ABNORMAL LOW (ref 39.0–52.0)
Hemoglobin: 9.4 g/dL — ABNORMAL LOW (ref 13.0–17.0)
Immature Granulocytes: 2 %
Lymphocytes Relative: 9 %
Lymphs Abs: 0.7 10*3/uL (ref 0.7–4.0)
MCH: 31.5 pg (ref 26.0–34.0)
MCHC: 32.2 g/dL (ref 30.0–36.0)
MCV: 98 fL (ref 80.0–100.0)
Monocytes Absolute: 0.8 10*3/uL (ref 0.1–1.0)
Monocytes Relative: 11 %
Neutro Abs: 5.6 10*3/uL (ref 1.7–7.7)
Neutrophils Relative %: 76 %
Platelet Count: 232 10*3/uL (ref 150–400)
RBC: 2.98 MIL/uL — ABNORMAL LOW (ref 4.22–5.81)
RDW: 19.4 % — ABNORMAL HIGH (ref 11.5–15.5)
WBC Count: 7.3 10*3/uL (ref 4.0–10.5)
nRBC: 0.6 % — ABNORMAL HIGH (ref 0.0–0.2)

## 2023-06-05 LAB — CMP (CANCER CENTER ONLY)
ALT: 11 U/L (ref 0–44)
AST: 21 U/L (ref 15–41)
Albumin: 3.2 g/dL — ABNORMAL LOW (ref 3.5–5.0)
Alkaline Phosphatase: 75 U/L (ref 38–126)
Anion gap: 3 — ABNORMAL LOW (ref 5–15)
BUN: 17 mg/dL (ref 8–23)
CO2: 21 mmol/L — ABNORMAL LOW (ref 22–32)
Calcium: 7.5 mg/dL — ABNORMAL LOW (ref 8.9–10.3)
Chloride: 111 mmol/L (ref 98–111)
Creatinine: 1.34 mg/dL — ABNORMAL HIGH (ref 0.61–1.24)
GFR, Estimated: 54 mL/min — ABNORMAL LOW (ref >60)
Glucose, Bld: 139 mg/dL — ABNORMAL HIGH (ref 70–99)
Potassium: 3.4 mmol/L — ABNORMAL LOW (ref 3.5–5.1)
Sodium: 135 mmol/L (ref 135–145)
Total Bilirubin: 0.5 mg/dL (ref 0.0–1.2)
Total Protein: 6.3 g/dL — ABNORMAL LOW (ref 6.5–8.1)

## 2023-06-05 MED ORDER — HEPARIN SOD (PORK) LOCK FLUSH 100 UNIT/ML IV SOLN
500.0000 [IU] | Freq: Once | INTRAVENOUS | Status: AC | PRN
  Administered 2023-06-05: 500 [IU]
  Filled 2023-06-05: qty 5

## 2023-06-05 MED ORDER — SODIUM CHLORIDE 0.9 % IV SOLN
1200.0000 mg | Freq: Once | INTRAVENOUS | Status: AC
  Administered 2023-06-05: 1200 mg via INTRAVENOUS
  Filled 2023-06-05: qty 20

## 2023-06-05 MED ORDER — SODIUM CHLORIDE 0.9 % IV SOLN
INTRAVENOUS | Status: DC
  Filled 2023-06-05: qty 250

## 2023-06-05 NOTE — Progress Notes (Signed)
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  INTERVAL HISTORY: Patient returns to clinic today for further evaluation, discussion of his PET scan results, and initiation of maintenance Tecentriq.  PET scan has not been read by radiology yet.  He continues to have chronic shortness of breath and requires oxygen majority of the day and at night.  He also has chronic weakness and fatigue.  He otherwise is tolerating his treatments well without significant side effects.  He has no neurologic complaints.  He denies any chest pain, cough, or hemoptysis.  He has a good appetite and denies weight loss.  He denies any nausea, vomiting, constipation, or diarrhea.  He has no urinary complaints.  Patient offers no further specific complaints today.  REVIEW OF SYSTEMS:   Review of Systems  Constitutional:  Positive for malaise/fatigue. Negative for fever and weight loss.  HENT: Negative.  Negative for congestion.   Respiratory:  Positive for shortness of breath. Negative for cough and hemoptysis.   Cardiovascular:  Positive for leg swelling. Negative for chest pain.  Gastrointestinal: Negative.  Negative for abdominal pain and nausea.  Genitourinary: Negative.  Negative for dysuria.  Musculoskeletal: Negative.  Negative for back pain.  Skin:  Negative for rash.  Neurological:  Positive for weakness. Negative for dizziness, focal weakness and headaches.  Endo/Heme/Allergies:  Does not  bruise/bleed easily.  Psychiatric/Behavioral:  Negative for depression. The patient is nervous/anxious.     As per HPI. Otherwise, a complete review of systems is negative.  PAST MEDICAL HISTORY: Past Medical History:  Diagnosis Date   Asthma    COPD (chronic obstructive pulmonary disease) (HCC)    Hearing loss    Hypertension    Hypothyroidism    Mass of left lung    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HCC)    Prostate enlargement    Shortness of breath    Squamous cell carcinoma of lung, left (HCC)    Thyroid disease    Tobacco abuse     PAST SURGICAL HISTORY: Past Surgical History:  Procedure Laterality Date   CARDIOVERSION N/A    CARDIOVERSION N/A 11/24/2021   Procedure: CARDIOVERSION;  Surgeon: Yvonne Kendall, MD;  Location: [facility] ORS;  Service: Cardiovascular;  Laterality: N/A;   ELECTROMAGNETIC NAVIGATION BROCHOSCOPY Left 10/19/2018   Procedure: ELECTROMAGNETIC NAVIGATION BRONCHOSCOPY LEFT;  Surgeon: Salena Saner, MD;  Location: [facility] ORS;  Service: Cardiopulmonary;  Laterality: Left;   ENDOBRONCHIAL ULTRASOUND Left 10/19/2018   Procedure: ENDOBRONCHIAL ULTRASOUND LEFT;  Surgeon: Salena Saner, MD;  Location: [facility] ORS;  Service: Cardiopulmonary;  Laterality: Left;   IR IMAGING GUIDED PORT INSERTION  03/03/2023   IR THORACENTESIS ASP PLEURAL SPACE W/IMG GUIDE  03/01/2023   MELANOMA EXCISION Left    NO PAST SURGERIES      FAMILY HISTORY: Family History  Problem Relation Age of Onset   Aneurysm Mother    Cancer Father     ADVANCED DIRECTIVES (Y/N):  N  HEALTH MAINTENANCE: Social History   Tobacco Use   Smoking status: Former    Current  packs/day: 0.00    Average packs/day: 0.3 packs/day for 62.0 years (15.5 ttl pk-yrs)    Types: Cigarettes    Start date: 09/04/1956    Quit date: 09/05/2018    Years since quitting: 4.7   Smokeless tobacco: Never  Vaping Use   Vaping status: Never Used  Substance Use Topics   Alcohol use: No    Alcohol/week: 0.0 standard  drinks of alcohol   Drug use: No     Colonoscopy:  PAP:  Bone density:  Lipid panel:  No Known Allergies  Current Outpatient Medications  Medication Sig Dispense Refill   albuterol (PROVENTIL HFA;VENTOLIN HFA) 108 (90 Base) MCG/ACT inhaler Inhale 2 puffs into the lungs every 6 (six) hours as needed for wheezing or shortness of breath. 1 Inhaler 0   albuterol (PROVENTIL) (2.5 MG/3ML) 0.083% nebulizer solution Take 2.5 mg by nebulization every 6 (six) hours as needed for wheezing or shortness of breath.     apixaban (ELIQUIS) 5 MG TABS tablet Take 2.5 mg by mouth 2 (two) times daily.     carboxymethylcellulose (REFRESH PLUS) 0.5 % SOLN 1 drop 4 (four) times daily as needed.     finasteride (PROSCAR) 5 MG tablet Take 5 mg by mouth daily.     fluticasone-salmeterol (ADVAIR) 250-50 MCG/ACT AEPB Inhale 1 puff into the lungs in the morning and at bedtime.     glimepiride (AMARYL) 2 MG tablet Take 2 mg by mouth daily with breakfast.     levothyroxine (SYNTHROID) 200 MCG tablet Take 200 mcg by mouth daily before breakfast.     lidocaine-prilocaine (EMLA) cream Apply to affected area once 30 g 3   ondansetron (ZOFRAN) 8 MG tablet Take 1 tablet (8 mg total) by mouth every 8 (eight) hours as needed for nausea, vomiting or refractory nausea / vomiting. Start on the third day after carboplatin. 60 tablet 2   Oxycodone HCl 10 MG TABS Take 1 tablet (10 mg total) by mouth every 6 (six) hours as needed. Okay to cut tablet in half if needed. 60 tablet 0   OXYGEN Inhale 3 L into the lungs continuous.     potassium chloride SA (KLOR-CON M) 20 MEQ tablet Take 1 tablet (20 mEq total) by mouth 2 (two) times daily. 60 tablet 1   prochlorperazine (COMPAZINE) 10 MG tablet Take 1 tablet (10 mg total) by mouth every 6 (six) hours as needed for nausea or vomiting. 60 tablet 2   SEMAGLUTIDE,0.25 OR 0.5MG /DOS, [place] Inject 0.5 mg into the skin once a week.     tamsulosin (FLOMAX) 0.4 MG CAPS capsule 0.8 mg daily.      Tiotropium Bromide Monohydrate (SPIRIVA RESPIMAT) 2.5 MCG/ACT AERS Inhale 2 puffs into the lungs daily. 1 each 11   torsemide (DEMADEX) 20 MG tablet TAKE 1 TABLET BY MOUTH EVERY OTHER DAY 45 tablet 2   vitamin B-12 (CYANOCOBALAMIN) 1000 MCG tablet Take 1,000 mcg by mouth daily.     No current facility-administered medications for this visit.   Facility-Administered Medications Ordered in Other Visits  Medication Dose Route Frequency Provider Last Rate Last Admin   0.9 %  sodium chloride infusion   Intravenous Continuous Jeralyn Ruths, MD 10 mL/hr at 04/05/23 1305 New Bag at 04/05/23 1305   0.9 %  sodium chloride infusion   Intravenous Continuous Wm Fruchter, Tollie Pizza, MD       atezolizumab [facility]) 1,200 mg in sodium chloride 0.9 % 250 mL chemo infusion  1,200 mg Intravenous Once Dayyan Krist J,  MD        OBJECTIVE: Vitals:   06/05/23 1310  BP: 116/60  Pulse: 71  Resp: 18  Temp: 98.2 F (36.8 C)  SpO2: 98%      Body mass index is 34.58 kg/m.    ECOG FS:1 - Symptomatic but completely ambulatory  General: Well-developed, well-nourished, no acute distress.  Sitting in a wheelchair. Eyes: Pink conjunctiva, anicteric sclera. HEENT: Normocephalic, moist mucous membranes. Lungs: No audible wheezing or coughing. Heart: Regular rate and rhythm. Abdomen: Soft, nontender, no obvious distention. Musculoskeletal: No edema, cyanosis, or clubbing. Neuro: Alert, answering all questions appropriately. Cranial nerves grossly intact. Skin: No rashes or petechiae noted. Psych: Normal affect.   LAB RESULTS:  Lab Results  Component Value Date   NA 135 06/05/2023   K 3.4 (L) 06/05/2023   CL 111 06/05/2023   CO2 21 (L) 06/05/2023   GLUCOSE 139 (H) 06/05/2023   BUN 17 06/05/2023   CREATININE 1.34 (H) 06/05/2023   CALCIUM 7.5 (L) 06/05/2023   PROT 6.3 (L) 06/05/2023   ALBUMIN 3.2 (L) 06/05/2023   AST 21 06/05/2023   ALT 11 06/05/2023   ALKPHOS 75 06/05/2023   BILITOT 0.5  06/05/2023   GFRNONAA 54 (L) 06/05/2023   GFRAA 58 (L) 07/18/2019    Lab Results  Component Value Date   WBC 7.3 06/05/2023   NEUTROABS 5.6 06/05/2023   HGB 9.4 (L) 06/05/2023   HCT 29.2 (L) 06/05/2023   MCV 98.0 06/05/2023   PLT 232 06/05/2023     STUDIES: No results found.   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  PET scan results from February 14, 2023 reviewed independently and reported as above confirming stage of disease although patient does not have metastatic disease outside his chest.  MRI of the brain on February 14, 2023 was negative for metastatic disease.  Patient completed cycle 4 of carboplatinum, etoposide, and Tecentriq on May 19, 2023.  PET scan results are pending at time of dictation.  Proceed with cycle 5 which is maintenance Tecentriq.  Return to clinic in 3 weeks for further evaluation and consideration of cycle 6.   Clinical stage IIB squamous cell carcinoma, left upper lobe lung:  Patient underwent biopsy with navigational bronchoscopy on October 19, 2018 confirming diagnosis.  Patient declined surgery and wished to pursue XRT along with concurrent chemotherapy.  Given his stage of disease he did not receive maintenance immunotherapy.  He completed cycle 7 of weekly carboplatinum and Taxol on January 09, 2019 and then completed XRT on January 14, 2019.   History of melanoma: Unclear stage or depth, although by report was in situ.  Patient had Mohs surgery in fall 2019.  Previous lung biopsy consistent with squamous cell carcinoma. Anxiety/depression: Chronic and unchanged.  Continue current medications as prescribed.  Continue follow-up with primary care physician as well as psychiatry at the [place]. Hypokalemia: Potassium improved to 3.4.  Continue oral potassium supplementation.   Hypomagnesia: Will draw magnesium with next  treatment. Shortness of breath/cough: Improved.  Patient's last thoracic ultrasound did not reveal any significant fluid.  Continue oxygen as prescribed. Pain: Does not complain of this today.  Continue oxycodone as needed. COVID: Resolved.  Patient completed a prescription of Paxlovid. Peripheral edema: Chronic and unchanged.  Patient has been instructed to increase his torsemide to daily if his blood pressure remains above  110 systolic. Hyponatremia: Resolved.  I spent a total of 30 minutes reviewing chart data, face-to-face evaluation with the patient, counseling and coordination of care as detailed above.  Patient expressed understanding and was in agreement with this plan. He also understands that He can call clinic at any time with any questions, concerns, or complaints.    Cancer Staging  Small cell lung cancer, left [facility]) Staging form: Lung, AJCC 8th Edition - Clinical stage from 02/24/2023: Stage IVA (cT4, cN2, cM1a) - Signed by Jeralyn Ruths, MD on 02/24/2023  Squamous cell carcinoma lung, left (HCC) Staging form: Lung, AJCC 8th Edition - Clinical stage from 10/27/2018: Stage IIB (cT1c, cN1, cM0) - Signed by Jeralyn Ruths, MD on 11/16/2018   Jeralyn Ruths, MD   06/05/2023 2:04 PM

## 2023-06-06 ENCOUNTER — Other Ambulatory Visit: Payer: Self-pay | Admitting: Oncology

## 2023-06-07 ENCOUNTER — Telehealth: Payer: Self-pay | Admitting: *Deleted

## 2023-06-07 NOTE — Telephone Encounter (Signed)
 Call placed to patients wife to review PET results. Per Dr. Adrian Alba PET scan looks great, proceed with maintenance Tecentriq  as planned. Patients wife verbalized understanding and is in agreement.

## 2023-06-26 ENCOUNTER — Inpatient Hospital Stay: Payer: Medicare Other

## 2023-06-26 ENCOUNTER — Encounter: Payer: Self-pay | Admitting: Oncology

## 2023-06-26 ENCOUNTER — Other Ambulatory Visit: Payer: Self-pay

## 2023-06-26 ENCOUNTER — Inpatient Hospital Stay: Payer: Medicare Other | Admitting: Oncology

## 2023-06-26 VITALS — BP 132/77 | HR 78 | Temp 97.9°F | Resp 16 | Ht 72.0 in | Wt 251.0 lb

## 2023-06-26 DIAGNOSIS — C3492 Malignant neoplasm of unspecified part of left bronchus or lung: Secondary | ICD-10-CM | POA: Diagnosis not present

## 2023-06-26 DIAGNOSIS — Z5112 Encounter for antineoplastic immunotherapy: Secondary | ICD-10-CM | POA: Diagnosis not present

## 2023-06-26 LAB — CMP (CANCER CENTER ONLY)
ALT: 10 U/L (ref 0–44)
AST: 19 U/L (ref 15–41)
Albumin: 3.3 g/dL — ABNORMAL LOW (ref 3.5–5.0)
Alkaline Phosphatase: 60 U/L (ref 38–126)
Anion gap: 13 (ref 5–15)
BUN: 24 mg/dL — ABNORMAL HIGH (ref 8–23)
CO2: 21 mmol/L — ABNORMAL LOW (ref 22–32)
Calcium: 8.8 mg/dL — ABNORMAL LOW (ref 8.9–10.3)
Chloride: 104 mmol/L (ref 98–111)
Creatinine: 1.5 mg/dL — ABNORMAL HIGH (ref 0.61–1.24)
GFR, Estimated: 47 mL/min — ABNORMAL LOW (ref >60)
Glucose, Bld: 120 mg/dL — ABNORMAL HIGH (ref 70–99)
Potassium: 3.6 mmol/L (ref 3.5–5.1)
Sodium: 138 mmol/L (ref 135–145)
Total Bilirubin: 0.6 mg/dL (ref 0.0–1.2)
Total Protein: 6.7 g/dL (ref 6.5–8.1)

## 2023-06-26 LAB — CBC WITH DIFFERENTIAL (CANCER CENTER ONLY)
Abs Immature Granulocytes: 0.03 10*3/uL (ref 0.00–0.07)
Basophils Absolute: 0 10*3/uL (ref 0.0–0.1)
Basophils Relative: 1 %
Eosinophils Absolute: 0.2 10*3/uL (ref 0.0–0.5)
Eosinophils Relative: 4 %
HCT: 37.1 % — ABNORMAL LOW (ref 39.0–52.0)
Hemoglobin: 11.9 g/dL — ABNORMAL LOW (ref 13.0–17.0)
Immature Granulocytes: 1 %
Lymphocytes Relative: 18 %
Lymphs Abs: 1 10*3/uL (ref 0.7–4.0)
MCH: 32.7 pg (ref 26.0–34.0)
MCHC: 32.1 g/dL (ref 30.0–36.0)
MCV: 101.9 fL — ABNORMAL HIGH (ref 80.0–100.0)
Monocytes Absolute: 0.9 10*3/uL (ref 0.1–1.0)
Monocytes Relative: 16 %
Neutro Abs: 3.4 10*3/uL (ref 1.7–7.7)
Neutrophils Relative %: 60 %
Platelet Count: 153 10*3/uL (ref 150–400)
RBC: 3.64 MIL/uL — ABNORMAL LOW (ref 4.22–5.81)
RDW: 16.5 % — ABNORMAL HIGH (ref 11.5–15.5)
WBC Count: 5.6 10*3/uL (ref 4.0–10.5)
nRBC: 0 % (ref 0.0–0.2)

## 2023-06-26 LAB — TSH: TSH: 0.163 u[IU]/mL — ABNORMAL LOW (ref 0.350–4.500)

## 2023-06-26 LAB — MAGNESIUM: Magnesium: 1.7 mg/dL (ref 1.7–2.4)

## 2023-06-26 MED ORDER — HEPARIN SOD (PORK) LOCK FLUSH 100 UNIT/ML IV SOLN
500.0000 [IU] | Freq: Once | INTRAVENOUS | Status: AC | PRN
  Administered 2023-06-26: 500 [IU]
  Filled 2023-06-26: qty 5

## 2023-06-26 MED ORDER — SODIUM CHLORIDE 0.9 % IV SOLN
INTRAVENOUS | Status: DC
  Filled 2023-06-26: qty 250

## 2023-06-26 MED ORDER — SODIUM CHLORIDE 0.9 % IV SOLN
1200.0000 mg | Freq: Once | INTRAVENOUS | Status: AC
  Administered 2023-06-26: 1200 mg via INTRAVENOUS
  Filled 2023-06-26: qty 20

## 2023-06-26 NOTE — Progress Notes (Signed)
 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  INTERVAL HISTORY: Patient returns to clinic today for further evaluation and continuation of maintenance Tecentriq.  He has chronic weakness and fatigue.  He he also continues to have chronic shortness of breath and requires oxygen majority of the day and at night.  He complains of being persistently cold, but otherwise feels well and is tolerating his treatments.  He has no neurologic complaints.  He denies any chest pain, cough, or hemoptysis.  He has a good appetite and denies weight loss.  He denies any nausea, vomiting, constipation, or diarrhea.  He has no urinary complaints.  Patient offers no further specific complaints today.  REVIEW OF SYSTEMS:   Review of Systems  Constitutional:  Positive for malaise/fatigue. Negative for fever and weight loss.  HENT: Negative.  Negative for congestion.   Respiratory:  Positive for shortness of breath. Negative for cough and hemoptysis.   Cardiovascular:  Positive for leg swelling. Negative for chest pain.  Gastrointestinal: Negative.  Negative for abdominal pain and nausea.  Genitourinary: Negative.  Negative for dysuria.  Musculoskeletal: Negative.  Negative for back pain.  Skin:  Negative for rash.  Neurological:  Positive for weakness. Negative for dizziness, focal weakness and headaches.  Endo/Heme/Allergies:  Does not bruise/bleed easily.  Psychiatric/Behavioral: Negative.   Negative for depression. The patient is not nervous/anxious.     As per HPI. Otherwise, a complete review of systems is negative.  PAST MEDICAL HISTORY: Past Medical History:  Diagnosis Date   Asthma    COPD (chronic obstructive pulmonary disease) (HCC)    Hearing loss    Hypertension    Hypothyroidism    Mass of left lung    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HCC)    Prostate enlargement    Shortness of breath    Squamous cell carcinoma of lung, left (HCC)    Thyroid disease    Tobacco abuse     PAST SURGICAL HISTORY: Past Surgical History:  Procedure Laterality Date   CARDIOVERSION N/A    CARDIOVERSION N/A 11/24/2021   Procedure: CARDIOVERSION;  Surgeon: Yvonne Kendall, MD;  Location: [facility] ORS;  Service: Cardiovascular;  Laterality: N/A;   ELECTROMAGNETIC NAVIGATION BROCHOSCOPY Left 10/19/2018   Procedure: ELECTROMAGNETIC NAVIGATION BRONCHOSCOPY LEFT;  Surgeon: Salena Saner, MD;  Location: [facility] ORS;  Service: Cardiopulmonary;  Laterality: Left;   ENDOBRONCHIAL ULTRASOUND Left 10/19/2018   Procedure: ENDOBRONCHIAL ULTRASOUND LEFT;  Surgeon: Salena Saner, MD;  Location: [facility] ORS;  Service: Cardiopulmonary;  Laterality: Left;   IR IMAGING GUIDED PORT INSERTION  03/03/2023   IR THORACENTESIS ASP PLEURAL SPACE W/IMG GUIDE  03/01/2023   MELANOMA EXCISION Left    NO PAST SURGERIES      FAMILY HISTORY: Family History  Problem Relation Age of Onset   Aneurysm Mother    Cancer Father     ADVANCED DIRECTIVES (Y/N):  N  HEALTH MAINTENANCE: Social History   Tobacco Use   Smoking status: Former    Current packs/day: 0.00    Average packs/day: 0.3 packs/day  for 62.0 years (15.5 ttl pk-yrs)    Types: Cigarettes    Start date: 09/04/1956    Quit date: 09/05/2018    Years since quitting: 4.8   Smokeless tobacco: Never  Vaping Use   Vaping status: Never Used  Substance Use Topics   Alcohol use: No    Alcohol/week: 0.0 standard drinks of alcohol   Drug use: No      Colonoscopy:  PAP:  Bone density:  Lipid panel:  No Known Allergies  Current Outpatient Medications  Medication Sig Dispense Refill   albuterol (PROVENTIL HFA;VENTOLIN HFA) 108 (90 Base) MCG/ACT inhaler Inhale 2 puffs into the lungs every 6 (six) hours as needed for wheezing or shortness of breath. 1 Inhaler 0   albuterol (PROVENTIL) (2.5 MG/3ML) 0.083% nebulizer solution Take 2.5 mg by nebulization every 6 (six) hours as needed for wheezing or shortness of breath.     apixaban (ELIQUIS) 5 MG TABS tablet Take 2.5 mg by mouth 2 (two) times daily.     carboxymethylcellulose (REFRESH PLUS) 0.5 % SOLN 1 drop 4 (four) times daily as needed.     finasteride (PROSCAR) 5 MG tablet Take 5 mg by mouth daily.     fluticasone-salmeterol (ADVAIR) 250-50 MCG/ACT AEPB Inhale 1 puff into the lungs in the morning and at bedtime.     glimepiride (AMARYL) 2 MG tablet Take 2 mg by mouth daily with breakfast.     levothyroxine (SYNTHROID) 200 MCG tablet Take 200 mcg by mouth daily before breakfast.     lidocaine-prilocaine (EMLA) cream Apply to affected area once 30 g 3   ondansetron (ZOFRAN) 8 MG tablet Take 1 tablet (8 mg total) by mouth every 8 (eight) hours as needed for nausea, vomiting or refractory nausea / vomiting. Start on the third day after carboplatin. 60 tablet 2   Oxycodone HCl 10 MG TABS Take 1 tablet (10 mg total) by mouth every 6 (six) hours as needed. Okay to cut tablet in half if needed. 60 tablet 0   OXYGEN Inhale 3 L into the lungs continuous.     potassium chloride SA (KLOR-CON M) 20 MEQ tablet TAKE 1 TABLET BY MOUTH TWICE A DAY 180 tablet 1   prochlorperazine (COMPAZINE) 10 MG tablet Take 1 tablet (10 mg total) by mouth every 6 (six) hours as needed for nausea or vomiting. 60 tablet 2   SEMAGLUTIDE,0.25 OR 0.5MG /DOS, [place] Inject 0.5 mg into the skin once a week.     tamsulosin (FLOMAX) 0.4 MG CAPS capsule 0.8 mg daily.     Tiotropium Bromide Monohydrate (SPIRIVA RESPIMAT) 2.5 MCG/ACT  AERS Inhale 2 puffs into the lungs daily. 1 each 11   torsemide (DEMADEX) 20 MG tablet TAKE 1 TABLET BY MOUTH EVERY OTHER DAY 45 tablet 2   vitamin B-12 (CYANOCOBALAMIN) 1000 MCG tablet Take 1,000 mcg by mouth daily.     No current facility-administered medications for this visit.   Facility-Administered Medications Ordered in Other Visits  Medication Dose Route Frequency Provider Last Rate Last Admin   0.9 %  sodium chloride infusion   Intravenous Continuous Jeralyn Ruths, MD 10 mL/hr at 04/05/23 1305 New Bag at 04/05/23 1305   0.9 %  sodium chloride infusion   Intravenous Continuous Quaron Delacruz, Tollie Pizza, MD       atezolizumab [facility]) 1,200 mg in sodium chloride 0.9 % 250 mL chemo infusion  1,200 mg Intravenous Once Jeralyn Ruths, MD       heparin lock flush 100 unit/mL  500 Units Intracatheter Once PRN Jeralyn Ruths, MD        OBJECTIVE: Vitals:   06/26/23 0928  BP: 132/77  Pulse: 78  Resp: 16  Temp: 97.9 F (36.6 C)  SpO2: 95%      Body mass index is 34.04 kg/m.    ECOG FS:2 - Symptomatic, <50% confined to bed  General: Well-developed, well-nourished, no acute distress. Eyes: Pink conjunctiva, anicteric sclera. HEENT: Normocephalic, moist mucous membranes. Lungs: No audible wheezing or coughing. Heart: Regular rate and rhythm. Abdomen: Soft, nontender, no obvious distention. Musculoskeletal: No edema, cyanosis, or clubbing. Neuro: Alert, answering all questions appropriately. Cranial nerves grossly intact. Skin: No rashes or petechiae noted. Psych: Normal affect.   LAB RESULTS:  Lab Results  Component Value Date   NA 138 06/26/2023   K 3.6 06/26/2023   CL 104 06/26/2023   CO2 21 (L) 06/26/2023   GLUCOSE 120 (H) 06/26/2023   BUN 24 (H) 06/26/2023   CREATININE 1.50 (H) 06/26/2023   CALCIUM 8.8 (L) 06/26/2023   PROT 6.7 06/26/2023   ALBUMIN 3.3 (L) 06/26/2023   AST 19 06/26/2023   ALT 10 06/26/2023   ALKPHOS 60 06/26/2023   BILITOT 0.6  06/26/2023   GFRNONAA 47 (L) 06/26/2023   GFRAA 58 (L) 07/18/2019    Lab Results  Component Value Date   WBC 5.6 06/26/2023   NEUTROABS 3.4 06/26/2023   HGB 11.9 (L) 06/26/2023   HCT 37.1 (L) 06/26/2023   MCV 101.9 (H) 06/26/2023   PLT 153 06/26/2023     STUDIES: NM PET Image Restag (PS) Skull Base To Thigh Result Date: 06/07/2023 CLINICAL DATA:  Subsequent treatment strategy for non-small cell lung cancer. EXAM: NUCLEAR MEDICINE PET SKULL BASE TO THIGH TECHNIQUE: 12.9 mCi F-18 FDG was injected intravenously. Full-ring PET imaging was performed from the skull base to thigh after the radiotracer. CT data was obtained and used for attenuation correction and anatomic localization. Fasting blood glucose: 114 mg/dl COMPARISON:  16/01/9603 FINDINGS: Mediastinal blood pool activity: SUV max 3.7 Liver activity: SUV max NA NECK: No significant abnormal hypermetabolic activity in this region. Incidental CT findings: Chronic bilateral ethmoid and maxillary sinusitis. Bilateral common carotid atheromatous vascular calcifications. CHEST: Overall thoracic findings are markedly improved from prior. Adenopathy markedly improved from prior. Index para-node 1.6 cm in short axis on image 63 series 6 with maximum SUV 5.5, previously measuring 3.3 cm in short axis with maximum SUV 10.8. Previous AP window and paratracheal adenopathy no longer hypermetabolic. 0.6 cm right apical nodule has maximum SUV of 1.1, previously measuring 1.6 cm with previous maximum SUV 12.5. Post therapy related findings in the left perihilar region with bandlike density. Component in the vicinity of the major fissure and superior segment left lower lobe is slightly nodular and measures 1.6 cm in diameter on image 46 series 6 with maximum SUV 5.3, previously measuring about 6 cm in diameter with maximum SUV 10.2. Reduced size of the left pleural effusion, currently small. Previous hypermetabolic tumor along the pleura no longer readily visible.  Incidental CT findings: Right Port-A-Cath tip: Right atrium. Coronary, aortic arch, and branch vessel atherosclerotic vascular disease. Mild cardiomegaly. Centrilobular emphysema. ABDOMEN/PELVIS: No significant abnormal hypermetabolic activity in this region. Incidental CT findings: Faint dependent density in the gallbladder, cannot exclude gallstones. Atherosclerosis is present, including aortoiliac atherosclerotic disease. Sigmoid colon diverticulosis. SKELETON: No significant abnormal hypermetabolic activity in this region. Incidental CT findings: Thoracic spondylosis. IMPRESSION: 1. Marked improvement in the appearance of the chest, with marked  reduction in size and hypermetabolic activity of the right apical nodule, left perihilar mass, thoracic adenopathy, and left pleural tumor. 2. Chronic bilateral ethmoid and maxillary sinusitis. 3. Aortic, coronary, and systemic atherosclerosis. Mild cardiomegaly. 4. Aortic Atherosclerosis (ICD10-I70.0) and Emphysema (ICD10-J43.9). 5. Sigmoid colon diverticulosis. Electronically Signed   By: Gaylyn Rong M.D.   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  PET scan results from February 14, 2023 reviewed independently and reported as above confirming stage of disease although patient does not have metastatic disease outside his chest.  MRI of the brain on February 14, 2023 was negative for metastatic disease.  Patient completed cycle 4 of carboplatinum, etoposide, and Tecentriq on May 19, 2023.  Repeat PET scan on June 07, 2023 reviewed independently and report as above with significant improvement of disease burden.  Will continue maintenance Tecentriq every 3 weeks for up to 2 years or until progression of disease.  Proceed with cycle 6 of treatment today.  Return to clinic in 3 weeks for further evaluation  and consideration of cycle 7.   Clinical stage IIB squamous cell carcinoma, left upper lobe lung:  Patient underwent biopsy with navigational bronchoscopy on October 19, 2018 confirming diagnosis.  Patient declined surgery and wished to pursue XRT along with concurrent chemotherapy.  Given his stage of disease he did not receive maintenance immunotherapy.  He completed cycle 7 of weekly carboplatinum and Taxol on January 09, 2019 and then completed XRT on January 14, 2019.   History of melanoma: Unclear stage or depth, although by report was in situ.  Patient had Mohs surgery in fall 2019.  Previous lung biopsy consistent with squamous cell carcinoma. Anxiety/depression: Chronic and unchanged.  Continue current medications as prescribed.  Continue follow-up with primary care physician as well as psychiatry at the [place]. Hypokalemia: Resolved.  Continue oral potassium supplementation.   Hypomagnesia: Resolved.   Shortness of breath/cough: Chronic and unchanged.  Patient's last thoracic ultrasound did not reveal any significant fluid.  Continue oxygen as prescribed. Pain: Does not complain of this today.  Continue oxycodone as needed. COVID: Resolved.  Patient completed a prescription of Paxlovid. Peripheral edema: Chronic and unchanged.  Patient has been instructed to increase his torsemide to daily if his blood pressure remains above 110 systolic. Hyponatremia: Resolved.  I spent a total of 30 minutes reviewing chart data, face-to-face evaluation with the patient, counseling and coordination of care as detailed above.   Patient expressed understanding and was in agreement with this plan. He also understands that He can call clinic at any time with any questions, concerns, or complaints.    Cancer Staging  Small cell lung cancer, left [facility]) Staging form: Lung, AJCC 8th Edition - Clinical stage from 02/24/2023: Stage IVA (cT4, cN2, cM1a) - Signed by Jeralyn Ruths, MD on 02/24/2023  Squamous  cell carcinoma lung, left (HCC) Staging form: Lung, AJCC 8th Edition - Clinical stage from 10/27/2018: Stage IIB (cT1c, cN1, cM0) - Signed by Jeralyn Ruths, MD on 11/16/2018   Jeralyn Ruths, MD   06/26/2023 11:21 AM

## 2023-06-26 NOTE — Patient Instructions (Signed)
 [facility] - A DEPT OF [facility]  Discharge Instructions: Thank you for choosing [facility] to provide your oncology and hematology care.  If you have a lab appointment with the Cancer Center, please go directly to the Cancer Center and check in at the registration area.  Wear comfortable clothing and clothing appropriate for easy access to any Portacath or PICC line.   We strive to give you quality time with your provider. You may need to reschedule your appointment if you arrive late (15 or more minutes).  Arriving late affects you and other patients whose appointments are after yours.  Also, if you miss three or more appointments without notifying the office, you may be dismissed from the clinic at the provider's discretion.      For prescription refill requests, have your pharmacy contact our office and allow 72 hours for refills to be completed.    Today you received the following chemotherapy and/or immunotherapy agents TECENTRIQ      To help prevent nausea and vomiting after your treatment, we encourage you to take your nausea medication as directed.  BELOW ARE SYMPTOMS THAT SHOULD BE REPORTED IMMEDIATELY: *FEVER GREATER THAN 100.4 F (38 C) OR HIGHER *CHILLS OR SWEATING *NAUSEA AND VOMITING THAT IS NOT CONTROLLED WITH YOUR NAUSEA MEDICATION *UNUSUAL SHORTNESS OF BREATH *UNUSUAL BRUISING OR BLEEDING *URINARY PROBLEMS (pain or burning when urinating, or frequent urination) *BOWEL PROBLEMS (unusual diarrhea, constipation, pain near the anus) TENDERNESS IN MOUTH AND THROAT WITH OR WITHOUT PRESENCE OF ULCERS (sore throat, sores in mouth, or a toothache) UNUSUAL RASH, SWELLING OR PAIN  UNUSUAL VAGINAL DISCHARGE OR ITCHING   Items with * indicate a potential emergency and should be followed up as soon as possible or go to the Emergency Department if any problems should occur.  Please show the CHEMOTHERAPY ALERT CARD or IMMUNOTHERAPY  ALERT CARD at check-in to the Emergency Department and triage nurse.  Should you have questions after your visit or need to cancel or reschedule your appointment, please contact [facility] - A DEPT OF [facility]  [phone] and follow the prompts.  Office hours are 8:00 a.m. to 4:30 p.m. Monday - Friday. Please note that voicemails left after 4:00 p.m. may not be returned until the following business day.  We are closed weekends and major holidays. You have access to a nurse at all times for urgent questions. Please call the main number to the clinic [phone] and follow the prompts.  For any non-urgent questions, you may also contact your provider using MyChart. We now offer e-Visits for anyone 43 and older to request care online for non-urgent symptoms. For details visit [email or web address].   Also download the MyChart app! Go to the app store, search "MyChart", open the app, select [place], and log in with your MyChart username and password.  Atezolizumab Injection What is this medication? ATEZOLIZUMAB (a te zoe LIZ ue mab) treats some types of cancer. It works by helping your immune system slow or stop the spread of cancer cells. It is a monoclonal antibody. This medicine may be used for other purposes; ask your health care provider or pharmacist if you have questions. COMMON BRAND NAME(S): Tecentriq What should I tell my care team before I take this medication? They need to know if you have any of these conditions: Allogeneic stem cell transplant (uses someone else's stem cells) Autoimmune diseases, such as Crohn  disease, ulcerative colitis, lupus History of chest radiation Nervous system problems, such as Guillain-Barre syndrome, myasthenia gravis Organ transplant An unusual or allergic reaction to atezolizumab, other medications, foods, dyes, or preservatives Pregnant or trying to get pregnant Breast-feeding How should I use this  medication? This medication is injected into a vein. It is given by your care team in a hospital or clinic setting. A special MedGuide will be given to you before each treatment. Be sure to read this information carefully each time. Talk to your care team about the use of this medication in children. While it may be prescribed for children as young as 2 years for selected conditions, precautions do apply. Overdosage: If you think you have taken too much of this medicine contact a poison control center or emergency room at once. NOTE: This medicine is only for you. Do not share this medicine with others. What if I miss a dose? Keep appointments for follow-up doses. It is important not to miss your dose. Call your care team if you are unable to keep an appointment. What may interact with this medication? Interactions have not been studied. This list may not describe all possible interactions. Give your health care provider a list of all the medicines, herbs, non-prescription drugs, or dietary supplements you use. Also tell them if you smoke, drink alcohol, or use illegal drugs. Some items may interact with your medicine. What should I watch for while using this medication? Your condition will be monitored carefully while you are receiving this medication. You may need blood work done while you are taking this medication. This medication may cause serious skin reactions. They can happen weeks to months after starting the medication. Contact your care team right away if you notice fevers or flu-like symptoms with a rash. The rash may be red or purple and then turn into blisters or peeling of the skin. You may also notice a red rash with swelling of the face, lips, or lymph nodes in your neck or under your arms. Talk to your care team if you may be pregnant. Serious birth defects can occur if you take this medication during pregnancy and for 5 months after the last dose. You will need a negative pregnancy  test before starting this medication. Contraception is recommended while taking this medication and for 5 months after the last dose. Your care team can help you find the option that works for you. Do not breastfeed while taking this medication and for 5 months after the last dose. This medication may cause infertility. Talk to your care team if you are concerned about your fertility. What side effects may I notice from receiving this medication? Side effects that you should report to your doctor or health care professional as soon as possible: Allergic reactions--skin rash, itching, hives, swelling of the face, lips, tongue, or throat Dry cough, shortness of breath or trouble breathing Eye pain, redness, irritation, or discharge with blurry or decreased vision Heart muscle inflammation--unusual weakness or fatigue, shortness of breath, chest pain, fast or irregular heartbeat, dizziness, swelling of the ankles, feet, or hands Hormone gland problems--headache, sensitivity to light, unusual weakness or fatigue, dizziness, fast or irregular heartbeat, increased sensitivity to cold or heat, excessive sweating, constipation, hair loss, increased thirst or amount of urine, tremors or shaking, irritability Infusion reactions--chest pain, shortness of breath or trouble breathing, feeling faint or lightheaded Kidney injury (glomerulonephritis)--decrease in the amount of urine, red or dark brown urine, foamy or bubbly urine, swelling of the  ankles, hands, or feet Liver injury--right upper belly pain, loss of appetite, nausea, light-colored stool, dark yellow or brown urine, yellowing skin or eyes, unusual weakness or fatigue Pain, tingling, or numbness in the hands or feet, muscle weakness, change in vision, confusion or trouble speaking, loss of balance or coordination, trouble walking, seizures Rash, fever, and swollen lymph nodes Redness, blistering, peeling, or loosening of the skin, including inside the  mouth Sudden or severe stomach pain, bloody diarrhea, fever, nausea, vomiting Side effects that usually do not require medical attention (report to your doctor or health care professional if they continue or are bothersome): Bone, joint, or muscle pain Diarrhea Fatigue Loss of appetite Nausea Skin rash This list may not describe all possible side effects. Call your doctor for medical advice about side effects. You may report side effects to FDA at 1-800-FDA-1088. Where should I keep my medication? This medication is given in a hospital or clinic. It will not be stored at home. NOTE: This sheet is a summary. It may not cover all possible information. If you have questions about this medicine, talk to your doctor, pharmacist, or health care provider.  2024 Elsevier/Gold Standard (2023-02-01 00:00:00)

## 2023-06-27 LAB — T4: T4, Total: 9.6 ug/dL (ref 4.5–12.0)

## 2023-06-28 ENCOUNTER — Inpatient Hospital Stay: Payer: Medicare Other | Admitting: Hospice and Palliative Medicine

## 2023-06-28 DIAGNOSIS — C3492 Malignant neoplasm of unspecified part of left bronchus or lung: Secondary | ICD-10-CM

## 2023-06-28 DIAGNOSIS — Z515 Encounter for palliative care: Secondary | ICD-10-CM | POA: Diagnosis not present

## 2023-06-28 NOTE — Progress Notes (Signed)
 Virtual Visit via Telephone Note  I connected with Levi [name] on 06/28/23 at  3:00 PM EST by telephone and verified that I am speaking with the correct person using two identifiers.  Location: Patient: Home Provider: Clinic   I discussed the limitations, risks, security and privacy concerns of performing an evaluation and management service by telephone and the availability of in person appointments. I also discussed with the patient that there may be a patient responsible charge related to this service. The patient expressed understanding and agreed to proceed.   History of Present Illness: Levi [name] is a 83 y.o. 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  Patient was referred to palliative care to address goals manage ongoing symptoms.    Observations/Objective: I called and spoke with patient and wife.  Reportedly, patient is doing well.  He denies any significant changes or concerns.  No new symptomatic complaints.  Does not have significant pain and is not regularly using the oxycodone.  Does have chronic shortness of breath.  He reports appetite is "too good."  He remains chronically weak and fatigued.  Both patient and wife feel that treatments are going well.  He is now on maintenance Tecentriq.  Assessment and Plan: Stage IVa extensive stage small cell lung cancer -appears to be doing well.  Now on maintenance Tecentriq.  No significant symptomatic complaints or concerns today.  Will follow.  Follow Up Instructions: Follow up telephone visit 2 -3 months   I discussed the assessment and treatment plan with the patient. The patient was provided an opportunity to ask questions and all were answered. The patient agreed with the plan and  demonstrated an understanding of the instructions.   The patient was advised to call back or seek an in-person evaluation if the symptoms worsen or if the condition fails to improve as anticipated.  I provided 10 minutes of non-face-to-face time during this encounter.   Malachy Moan, NP

## 2023-07-11 ENCOUNTER — Ambulatory Visit
Admission: RE | Admit: 2023-07-11 | Discharge: 2023-07-11 | Disposition: A | Discharge status: Home / Self Care | Source: Ambulatory Visit | Attending: Nurse Practitioner

## 2023-07-11 ENCOUNTER — Other Ambulatory Visit: Payer: Self-pay | Admitting: Nurse Practitioner

## 2023-07-11 ENCOUNTER — Inpatient Hospital Stay: Attending: Oncology

## 2023-07-11 ENCOUNTER — Encounter: Payer: Self-pay | Admitting: Nurse Practitioner

## 2023-07-11 ENCOUNTER — Telehealth: Payer: Self-pay

## 2023-07-11 ENCOUNTER — Ambulatory Visit: Admitting: Nurse Practitioner

## 2023-07-11 ENCOUNTER — Ambulatory Visit
Admission: RE | Admit: 2023-07-11 | Discharge: 2023-07-11 | Disposition: A | Discharge status: Home / Self Care | Attending: Nurse Practitioner | Admitting: Nurse Practitioner

## 2023-07-11 VITALS — BP 132/63 | HR 96 | Temp 98.9°F | Resp 16 | Wt 249.8 lb

## 2023-07-11 DIAGNOSIS — Z8701 Personal history of pneumonia (recurrent): Secondary | ICD-10-CM | POA: Diagnosis not present

## 2023-07-11 DIAGNOSIS — C3412 Malignant neoplasm of upper lobe, left bronchus or lung: Secondary | ICD-10-CM | POA: Diagnosis present

## 2023-07-11 DIAGNOSIS — Z79620 Long term (current) use of immunosuppressive biologic: Secondary | ICD-10-CM | POA: Diagnosis not present

## 2023-07-11 DIAGNOSIS — Z5112 Encounter for antineoplastic immunotherapy: Secondary | ICD-10-CM | POA: Diagnosis present

## 2023-07-11 DIAGNOSIS — F32A Depression, unspecified: Secondary | ICD-10-CM | POA: Insufficient documentation

## 2023-07-11 DIAGNOSIS — R0602 Shortness of breath: Secondary | ICD-10-CM | POA: Insufficient documentation

## 2023-07-11 DIAGNOSIS — I10 Essential (primary) hypertension: Secondary | ICD-10-CM | POA: Diagnosis not present

## 2023-07-11 DIAGNOSIS — Z85820 Personal history of malignant melanoma of skin: Secondary | ICD-10-CM | POA: Diagnosis not present

## 2023-07-11 DIAGNOSIS — C3492 Malignant neoplasm of unspecified part of left bronchus or lung: Secondary | ICD-10-CM | POA: Diagnosis not present

## 2023-07-11 DIAGNOSIS — R609 Edema, unspecified: Secondary | ICD-10-CM | POA: Diagnosis not present

## 2023-07-11 DIAGNOSIS — R051 Acute cough: Secondary | ICD-10-CM

## 2023-07-11 DIAGNOSIS — R059 Cough, unspecified: Secondary | ICD-10-CM | POA: Insufficient documentation

## 2023-07-11 DIAGNOSIS — F419 Anxiety disorder, unspecified: Secondary | ICD-10-CM | POA: Insufficient documentation

## 2023-07-11 LAB — CBC WITH DIFFERENTIAL (CANCER CENTER ONLY)
Abs Immature Granulocytes: 0.06 10*3/uL (ref 0.00–0.07)
Basophils Absolute: 0 10*3/uL (ref 0.0–0.1)
Basophils Relative: 0 %
Eosinophils Absolute: 0.1 10*3/uL (ref 0.0–0.5)
Eosinophils Relative: 1 %
HCT: 37.2 % — ABNORMAL LOW (ref 39.0–52.0)
Hemoglobin: 12 g/dL — ABNORMAL LOW (ref 13.0–17.0)
Immature Granulocytes: 1 %
Lymphocytes Relative: 7 %
Lymphs Abs: 0.6 10*3/uL — ABNORMAL LOW (ref 0.7–4.0)
MCH: 32 pg (ref 26.0–34.0)
MCHC: 32.3 g/dL (ref 30.0–36.0)
MCV: 99.2 fL (ref 80.0–100.0)
Monocytes Absolute: 0.8 10*3/uL (ref 0.1–1.0)
Monocytes Relative: 10 %
Neutro Abs: 6.5 10*3/uL (ref 1.7–7.7)
Neutrophils Relative %: 81 %
Platelet Count: 179 10*3/uL (ref 150–400)
RBC: 3.75 MIL/uL — ABNORMAL LOW (ref 4.22–5.81)
RDW: 13.6 % (ref 11.5–15.5)
WBC Count: 8.1 10*3/uL (ref 4.0–10.5)
nRBC: 0 % (ref 0.0–0.2)

## 2023-07-11 LAB — CMP (CANCER CENTER ONLY)
ALT: 12 U/L (ref 0–44)
AST: 14 U/L — ABNORMAL LOW (ref 15–41)
Albumin: 3 g/dL — ABNORMAL LOW (ref 3.5–5.0)
Alkaline Phosphatase: 67 U/L (ref 38–126)
Anion gap: 9 (ref 5–15)
BUN: 18 mg/dL (ref 8–23)
CO2: 22 mmol/L (ref 22–32)
Calcium: 8.6 mg/dL — ABNORMAL LOW (ref 8.9–10.3)
Chloride: 105 mmol/L (ref 98–111)
Creatinine: 1.45 mg/dL — ABNORMAL HIGH (ref 0.61–1.24)
GFR, Estimated: 49 mL/min — ABNORMAL LOW (ref >60)
Glucose, Bld: 145 mg/dL — ABNORMAL HIGH (ref 70–99)
Potassium: 3.4 mmol/L — ABNORMAL LOW (ref 3.5–5.1)
Sodium: 136 mmol/L (ref 135–145)
Total Bilirubin: 0.8 mg/dL (ref 0.0–1.2)
Total Protein: 6.6 g/dL (ref 6.5–8.1)

## 2023-07-11 LAB — RESPIRATORY PANEL BY PCR

## 2023-07-11 MED ORDER — IPRATROPIUM-ALBUTEROL 0.5-2.5 (3) MG/3ML IN SOLN: 3.0000 mL | Freq: Four times a day (QID) | RESPIRATORY_TRACT | 3 refills | Status: AC | PRN

## 2023-07-11 MED ORDER — HYDROCOD POLI-CHLORPHE POLI ER 10-8 MG/5ML PO SUER: 5.0000 mL | Freq: Two times a day (BID) | ORAL | 0 refills | Status: AC | PRN

## 2023-07-11 NOTE — Progress Notes (Signed)
 Symptom Management Clinic  [facility] A Department of the [place]. [facility] [address] [place], [place] [address] [phone] (fax)  [facility] Care Team: [facility] as PCP - General (General Practice) Debbe Odea, MD as PCP - Cardiology (Cardiology) Lanier Prude, MD as PCP - Electrophysiology (Cardiology) Glory Buff, RN as Registered Nurse Orlie Dakin, Tollie Pizza, MD as Consulting Physician (Oncology) Carmina Miller, MD as Referring Physician (Radiation Oncology)   Name of the patient: Levi [name]  [id number]  [birth year]   Date of visit: 07/11/23  Diagnosis- Small Cell Lung Cancer  Chief complaint/ Reason for visit- Cough  Heme/Onc history:  Oncology History  Squamous cell carcinoma lung, left (HCC)  10/27/2018 Initial Diagnosis   Squamous cell carcinoma lung, left (HCC)   10/27/2018 Cancer Staging   Staging form: Lung, AJCC 8th Edition - Clinical stage from 10/27/2018: Stage IIB (cT1c, cN1, cM0) - Signed by Jeralyn Ruths, MD on 11/16/2018   11/27/2018 - 01/09/2019 Chemotherapy   Patient is on Treatment Plan : LUNG Carboplatin / Paclitaxel + XRT q7d     Small cell lung cancer, left (HCC)  02/24/2023 Initial Diagnosis   Small cell lung cancer, left (HCC)   02/24/2023 Cancer Staging   Staging form: Lung, AJCC 8th Edition - Clinical stage from 02/24/2023: Stage IVA (cT4, cN2, cM1a) - Signed by Jeralyn Ruths, MD on 02/24/2023   03/06/2023 -  Chemotherapy   Patient is on Treatment Plan : LUNG SCLC Carboplatin + Etoposide + Atezolizumab Induction q21d x 4 cycles / Atezolizumab Maintenance q21d       Interval history-patient is 83 year old male with above history of small cell lung cancer currently on Tecentriq who presents to symptom management clinic for complaints of cough.  Symptoms started approximately a week ago.  Last night he had temperature of  100.4.  It has now resolved.  He has cough at baseline but feels like in the past week it has become deeper and more uncomfortable to take a deep breath.  Feels like when he required a thoracentesis in the past.  Denies sick exposures.  Nobody else in the family is sick.  Has had some nausea and decreased appetite.  He wears oxygen at baseline and use is unchanged.  Review of systems- Review of Systems  Constitutional:  Positive for malaise/fatigue and weight loss. Negative for chills and fever.  HENT:  Negative for hearing loss, nosebleeds, sore throat and tinnitus.   Eyes:  Negative for blurred vision and double vision.  Respiratory:  Positive for cough and sputum production. Negative for hemoptysis, shortness of breath and wheezing.   Cardiovascular:  Negative for chest pain, palpitations and leg swelling.  Gastrointestinal:  Negative for abdominal pain, blood in stool, constipation, diarrhea, melena, nausea and vomiting.       Decreased appetite  Genitourinary:  Negative for dysuria and urgency.  Musculoskeletal:  Negative for back pain, falls, joint pain and myalgias.  Skin:  Negative for itching and rash.  Neurological:  Negative for dizziness, tingling, sensory change, loss of consciousness, weakness and headaches.  Endo/Heme/Allergies:  Negative for environmental allergies. Does not bruise/bleed easily.  Psychiatric/Behavioral:  Negative for depression. The patient is not nervous/anxious and does not have insomnia.      No Known Allergies  Past Medical History:  Diagnosis Date   Asthma    COPD (chronic obstructive pulmonary disease) (HCC)    Hearing loss  Hypertension    Hypothyroidism    Mass of left lung    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HCC)    Prostate enlargement    Shortness of breath    Squamous cell carcinoma of lung, left (HCC)    Thyroid disease    Tobacco abuse     Past Surgical History:  Procedure Laterality Date   CARDIOVERSION N/A    CARDIOVERSION N/A  11/24/2021   Procedure: CARDIOVERSION;  Surgeon: Yvonne Kendall, MD;  Location: [facility] ORS;  Service: Cardiovascular;  Laterality: N/A;   ELECTROMAGNETIC NAVIGATION BROCHOSCOPY Left 10/19/2018   Procedure: ELECTROMAGNETIC NAVIGATION BRONCHOSCOPY LEFT;  Surgeon: Salena Saner, MD;  Location: [facility] ORS;  Service: Cardiopulmonary;  Laterality: Left;   ENDOBRONCHIAL ULTRASOUND Left 10/19/2018   Procedure: ENDOBRONCHIAL ULTRASOUND LEFT;  Surgeon: Salena Saner, MD;  Location: [facility] ORS;  Service: Cardiopulmonary;  Laterality: Left;   IR IMAGING GUIDED PORT INSERTION  03/03/2023   IR THORACENTESIS ASP PLEURAL SPACE W/IMG GUIDE  03/01/2023   MELANOMA EXCISION Left    NO PAST SURGERIES      Social History   Socioeconomic History   Marital status: Married    Spouse name: Olegario Messier   Number of children: 5   Years of education: Not on file   Highest education level: Not on file  Occupational History   Not on file  Tobacco Use   Smoking status: Former    Current packs/day: 0.00    Average packs/day: 0.3 packs/day for 62.0 years (15.5 ttl pk-yrs)    Types: Cigarettes    Start date: 09/04/1956    Quit date: 09/05/2018    Years since quitting: 4.8   Smokeless tobacco: Never  Vaping Use   Vaping status: Never Used  Substance and Sexual Activity   Alcohol use: No    Alcohol/week: 0.0 standard drinks of alcohol   Drug use: No   Sexual activity: Not on file  Other Topics Concern   Not on file  Social History Narrative   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 He is married to his wife of 30+ years, Olegario Messier and has 5 children (1 deceased), many grand children, and one great grand child.    Social Drivers of Corporate investment banker Strain: Low Risk  (12/04/2018)   Overall Financial Resource Strain (CARDIA)    Difficulty of  Paying Living Expenses: Not very hard  Food Insecurity: No Food Insecurity (12/04/2018)   Hunger Vital Sign    Worried About Running Out of Food in the Last Year: Never true    Ran Out of Food in the Last Year: Never true  Transportation Needs: No Transportation Needs (12/04/2018)   PRAPARE - Administrator, Civil Service (Medical): No    Lack of Transportation (Non-Medical): No  Physical Activity: Not on file  Stress: Stress Concern Present (12/04/2018)   [employer] of Occupational Health - Occupational Stress Questionnaire    Feeling of Stress : Very much  Social Connections: Unknown (12/04/2018)   Social Connection and Isolation Panel [NHANES]    Frequency of Communication with Friends and Family: More than three times a week    Frequency of Social Gatherings with Friends and Family: Once a week    Attends Religious Services: Not on Marketing executive or Organizations:  Not on file    Attends Club or Organization Meetings: Not on file    Marital Status: Not on file  Intimate Partner Violence: Not on file    Family History  Problem Relation Age of Onset   Aneurysm Mother    Cancer Father     Current Outpatient Medications:    albuterol (PROVENTIL HFA;VENTOLIN HFA) 108 (90 Base) MCG/ACT inhaler, Inhale 2 puffs into the lungs every 6 (six) hours as needed for wheezing or shortness of breath., Disp: 1 Inhaler, Rfl: 0   albuterol (PROVENTIL) (2.5 MG/3ML) 0.083% nebulizer solution, Take 2.5 mg by nebulization every 6 (six) hours as needed for wheezing or shortness of breath., Disp: , Rfl:    apixaban (ELIQUIS) 5 MG TABS tablet, Take 2.5 mg by mouth 2 (two) times daily., Disp: , Rfl:    carboxymethylcellulose (REFRESH PLUS) 0.5 % SOLN, 1 drop 4 (four) times daily as needed., Disp: , Rfl:    finasteride (PROSCAR) 5 MG tablet, Take 5 mg by mouth daily., Disp: , Rfl:    fluticasone-salmeterol (ADVAIR) 250-50 MCG/ACT AEPB, Inhale 1 puff into the lungs in the morning  and at bedtime., Disp: , Rfl:    glimepiride (AMARYL) 2 MG tablet, Take 2 mg by mouth daily with breakfast., Disp: , Rfl:    levothyroxine (SYNTHROID) 200 MCG tablet, Take 200 mcg by mouth daily before breakfast., Disp: , Rfl:    lidocaine-prilocaine (EMLA) cream, Apply to affected area once, Disp: 30 g, Rfl: 3   ondansetron (ZOFRAN) 8 MG tablet, Take 1 tablet (8 mg total) by mouth every 8 (eight) hours as needed for nausea, vomiting or refractory nausea / vomiting. Start on the third day after carboplatin., Disp: 60 tablet, Rfl: 2   Oxycodone HCl 10 MG TABS, Take 1 tablet (10 mg total) by mouth every 6 (six) hours as needed. Okay to cut tablet in half if needed., Disp: 60 tablet, Rfl: 0   OXYGEN, Inhale 3 L into the lungs continuous., Disp: , Rfl:    potassium chloride SA (KLOR-CON M) 20 MEQ tablet, TAKE 1 TABLET BY MOUTH TWICE A DAY, Disp: 180 tablet, Rfl: 1   prochlorperazine (COMPAZINE) 10 MG tablet, Take 1 tablet (10 mg total) by mouth every 6 (six) hours as needed for nausea or vomiting., Disp: 60 tablet, Rfl: 2   SEMAGLUTIDE,0.25 OR 0.5MG /DOS, [place], Inject 0.5 mg into the skin once a week., Disp: , Rfl:    tamsulosin (FLOMAX) 0.4 MG CAPS capsule, 0.8 mg daily., Disp: , Rfl:    Tiotropium Bromide Monohydrate (SPIRIVA RESPIMAT) 2.5 MCG/ACT AERS, Inhale 2 puffs into the lungs daily., Disp: 1 each, Rfl: 11   torsemide (DEMADEX) 20 MG tablet, TAKE 1 TABLET BY MOUTH EVERY OTHER DAY, Disp: 45 tablet, Rfl: 2   vitamin B-12 (CYANOCOBALAMIN) 1000 MCG tablet, Take 1,000 mcg by mouth daily., Disp: , Rfl:  No current facility-administered medications for this visit.  Facility-Administered Medications Ordered in Other Visits:    0.9 %  sodium chloride infusion, , Intravenous, Continuous, Finnegan, Tollie Pizza, MD, Last Rate: 10 mL/hr at 04/05/23 1305, New Bag at 04/05/23 1305  Physical exam:  Vitals:   07/11/23 1328  BP: 132/63  Pulse: 96  Resp: 16  Temp: 98.9 F (37.2 C)  TempSrc: Tympanic  SpO2:  94%  Weight: 249 lb 12.8 oz (113.3 kg)   Physical Exam Vitals reviewed.  Constitutional:      Appearance: He is obese. He is not ill-appearing.     Interventions: Nasal  cannula in place.     Comments: Accompanied by Olegario Messier. In wheelchair  Cardiovascular:     Rate and Rhythm: Normal rate. Rhythm irregular.  Pulmonary:     Effort: No respiratory distress.     Comments: Diminished bilaterally Skin:    Coloration: Skin is not pale.  Neurological:     Mental Status: He is alert and oriented to person, place, and time.  Psychiatric:        Mood and Affect: Mood normal.        Behavior: Behavior normal.        Latest Ref Rng & Units 07/11/2023    1:21 PM  CMP  Glucose 70 - 99 mg/dL 284   BUN 8 - 23 mg/dL 18   Creatinine 1.32 - 1.24 mg/dL 4.40   Sodium 102 - 725 mmol/L 136   Potassium 3.5 - 5.1 mmol/L 3.4   Chloride 98 - 111 mmol/L 105   CO2 22 - 32 mmol/L 22   Calcium 8.9 - 10.3 mg/dL 8.6   Total Protein 6.5 - 8.1 g/dL 6.6   Total Bilirubin 0.0 - 1.2 mg/dL 0.8   Alkaline Phos 38 - 126 U/L 67   AST 15 - 41 U/L 14   ALT 0 - 44 U/L 12       Latest Ref Rng & Units 07/11/2023    1:20 PM  CBC  WBC 4.0 - 10.5 K/uL 8.1   Hemoglobin 13.0 - 17.0 g/dL 36.6   Hematocrit 44.0 - 52.0 % 37.2   Platelets 150 - 400 K/uL 179    No images are attached to the encounter.  DG Chest 2 View Result Date: 07/11/2023 CLINICAL DATA:  History of small cell lung cancer.  Fever and cough. EXAM: CHEST - 2 VIEW COMPARISON:  Chest radiograph dated 03/20/2023. FINDINGS: Right-sided Port-A-Cath with tip over the cavoatrial junction. Small left pleural effusion with left lung base atelectasis. Left upper lobe streaky densities slightly progressed since the prior radiograph. Probable right lung base atelectasis. No pneumothorax. Stable cardiac silhouette. No acute osseous pathology. IMPRESSION: 1. Small left pleural effusion with left lung base atelectasis. 2. Left upper lobe streaky densities slightly  progressed since the prior radiograph. Electronically Signed   By: Elgie Collard M.D.   On: 07/11/2023 13:23    Assessment and plan- Patient is a 83 y.o. male   Cough- question viral etiology vs exacerbation of underlying copd/emphysema & lung cancer vs developing pneumonia. Chest xray reveals small left pleural effusion but no apparent pneumonia. Clinically afebrile. Respiratory virus panel in process. No leukocytosis on labs. Recommend holding antibiotics for now. Prefer to minimize steroids given use of tecentriq. Plan to treat symptomatically for now and if worsening symptoms consider antibiotics. Start duoneb/combivent nebulizers and tussionex. Narcotic precautions reviewed.   RTC as scheduled. If symptoms don't improve or worsen, notify clinic for sooner reevaluation. Updated Dr Orlie Dakin.    Visit Diagnosis 1. Acute cough   2. Small cell lung cancer, left [facility])     Patient expressed understanding and was in agreement with this plan. He also understands that He can call clinic at any time with any questions, concerns, or complaints.   Thank you for allowing me to participate in the care of this very pleasant patient.   Consuello Masse, DNP, AGNP-C, AOCNP [facility] [phone]

## 2023-07-11 NOTE — Telephone Encounter (Signed)
 Patient not felt well for past week and has been taking Tylenol Flu.  Temp of 100.4 yesterday, dry cough, chest hurts with cough, with sweats last night.  He's not sure if it could be pneumonia or needing fluid drawn off lungs again.  While on phone temp was 98.2 last took Tylenol at 10:00 pm with O2 89%.  Does have home O2 he uses at night or PRN during the day.

## 2023-07-11 NOTE — Telephone Encounter (Signed)
 Levi A, NP would like patient to go for chest x-ray this morning then come to [facility] for labs/[facility] this afternoon.  Patient wife is notified.  Message to scheduling for lab/SMC appt this afternoon.

## 2023-07-17 ENCOUNTER — Encounter: Payer: Self-pay | Admitting: Oncology

## 2023-07-17 ENCOUNTER — Inpatient Hospital Stay: Payer: Medicare Other

## 2023-07-17 ENCOUNTER — Inpatient Hospital Stay (HOSPITAL_BASED_OUTPATIENT_CLINIC_OR_DEPARTMENT_OTHER): Payer: Medicare Other | Admitting: Oncology

## 2023-07-17 VITALS — BP 119/55 | HR 50 | Resp 16

## 2023-07-17 VITALS — BP 132/78 | HR 65 | Temp 98.3°F | Resp 18 | Ht 72.0 in | Wt 252.0 lb

## 2023-07-17 DIAGNOSIS — C3492 Malignant neoplasm of unspecified part of left bronchus or lung: Secondary | ICD-10-CM

## 2023-07-17 DIAGNOSIS — Z5112 Encounter for antineoplastic immunotherapy: Secondary | ICD-10-CM | POA: Diagnosis not present

## 2023-07-17 LAB — CMP (CANCER CENTER ONLY)
ALT: 10 U/L (ref 0–44)
AST: 18 U/L (ref 15–41)
Albumin: 2.9 g/dL — ABNORMAL LOW (ref 3.5–5.0)
Alkaline Phosphatase: 64 U/L (ref 38–126)
Anion gap: 8 (ref 5–15)
BUN: 18 mg/dL (ref 8–23)
CO2: 23 mmol/L (ref 22–32)
Calcium: 8.6 mg/dL — ABNORMAL LOW (ref 8.9–10.3)
Chloride: 104 mmol/L (ref 98–111)
Creatinine: 1.29 mg/dL — ABNORMAL HIGH (ref 0.61–1.24)
GFR, Estimated: 56 mL/min — ABNORMAL LOW (ref >60)
Glucose, Bld: 121 mg/dL — ABNORMAL HIGH (ref 70–99)
Potassium: 3.6 mmol/L (ref 3.5–5.1)
Sodium: 135 mmol/L (ref 135–145)
Total Bilirubin: 0.4 mg/dL (ref 0.0–1.2)
Total Protein: 6.5 g/dL (ref 6.5–8.1)

## 2023-07-17 LAB — CBC WITH DIFFERENTIAL (CANCER CENTER ONLY)
Abs Immature Granulocytes: 0.03 10*3/uL (ref 0.00–0.07)
Basophils Absolute: 0 10*3/uL (ref 0.0–0.1)
Basophils Relative: 1 %
Eosinophils Absolute: 0.2 10*3/uL (ref 0.0–0.5)
Eosinophils Relative: 4 %
HCT: 37.6 % — ABNORMAL LOW (ref 39.0–52.0)
Hemoglobin: 11.7 g/dL — ABNORMAL LOW (ref 13.0–17.0)
Immature Granulocytes: 1 %
Lymphocytes Relative: 18 %
Lymphs Abs: 1.1 10*3/uL (ref 0.7–4.0)
MCH: 30.8 pg (ref 26.0–34.0)
MCHC: 31.1 g/dL (ref 30.0–36.0)
MCV: 98.9 fL (ref 80.0–100.0)
Monocytes Absolute: 0.6 10*3/uL (ref 0.1–1.0)
Monocytes Relative: 9 %
Neutro Abs: 4.3 10*3/uL (ref 1.7–7.7)
Neutrophils Relative %: 67 %
Platelet Count: 246 10*3/uL (ref 150–400)
RBC: 3.8 MIL/uL — ABNORMAL LOW (ref 4.22–5.81)
RDW: 13.5 % (ref 11.5–15.5)
WBC Count: 6.2 10*3/uL (ref 4.0–10.5)
nRBC: 0 % (ref 0.0–0.2)

## 2023-07-17 MED ORDER — SODIUM CHLORIDE 0.9 % IV SOLN
INTRAVENOUS | Status: DC
  Filled 2023-07-17: qty 250

## 2023-07-17 MED ORDER — HEPARIN SOD (PORK) LOCK FLUSH 100 UNIT/ML IV SOLN
500.0000 [IU] | Freq: Once | INTRAVENOUS | Status: AC | PRN
  Administered 2023-07-17: 500 [IU]
  Filled 2023-07-17: qty 5

## 2023-07-17 MED ORDER — SODIUM CHLORIDE 0.9 % IV SOLN
1200.0000 mg | Freq: Once | INTRAVENOUS | Status: AC
  Administered 2023-07-17: 1200 mg via INTRAVENOUS
  Filled 2023-07-17: qty 20

## 2023-07-17 NOTE — Patient Instructions (Signed)
 [facility] - A DEPT OF [facility]  Discharge Instructions: Thank you for choosing [facility] to provide your oncology and hematology care.  If you have a lab appointment with the Cancer Center, please go directly to the Cancer Center and check in at the registration area.  Wear comfortable clothing and clothing appropriate for easy access to any Portacath or PICC line.   We strive to give you quality time with your provider. You may need to reschedule your appointment if you arrive late (15 or more minutes).  Arriving late affects you and other patients whose appointments are after yours.  Also, if you miss three or more appointments without notifying the office, you may be dismissed from the clinic at the provider's discretion.      For prescription refill requests, have your pharmacy contact our office and allow 72 hours for refills to be completed.    Today you received the following chemotherapy and/or immunotherapy agents TECENTRIQ      To help prevent nausea and vomiting after your treatment, we encourage you to take your nausea medication as directed.  BELOW ARE SYMPTOMS THAT SHOULD BE REPORTED IMMEDIATELY: *FEVER GREATER THAN 100.4 F (38 C) OR HIGHER *CHILLS OR SWEATING *NAUSEA AND VOMITING THAT IS NOT CONTROLLED WITH YOUR NAUSEA MEDICATION *UNUSUAL SHORTNESS OF BREATH *UNUSUAL BRUISING OR BLEEDING *URINARY PROBLEMS (pain or burning when urinating, or frequent urination) *BOWEL PROBLEMS (unusual diarrhea, constipation, pain near the anus) TENDERNESS IN MOUTH AND THROAT WITH OR WITHOUT PRESENCE OF ULCERS (sore throat, sores in mouth, or a toothache) UNUSUAL RASH, SWELLING OR PAIN  UNUSUAL VAGINAL DISCHARGE OR ITCHING   Items with * indicate a potential emergency and should be followed up as soon as possible or go to the Emergency Department if any problems should occur.  Please show the CHEMOTHERAPY ALERT CARD or IMMUNOTHERAPY  ALERT CARD at check-in to the Emergency Department and triage nurse.  Should you have questions after your visit or need to cancel or reschedule your appointment, please contact [facility] - A DEPT OF [facility]  [phone] and follow the prompts.  Office hours are 8:00 a.m. to 4:30 p.m. Monday - Friday. Please note that voicemails left after 4:00 p.m. may not be returned until the following business day.  We are closed weekends and major holidays. You have access to a nurse at all times for urgent questions. Please call the main number to the clinic [phone] and follow the prompts.  For any non-urgent questions, you may also contact your provider using MyChart. We now offer e-Visits for anyone 43 and older to request care online for non-urgent symptoms. For details visit [email or web address].   Also download the MyChart app! Go to the app store, search "MyChart", open the app, select [place], and log in with your MyChart username and password.  Atezolizumab Injection What is this medication? ATEZOLIZUMAB (a te zoe LIZ ue mab) treats some types of cancer. It works by helping your immune system slow or stop the spread of cancer cells. It is a monoclonal antibody. This medicine may be used for other purposes; ask your health care provider or pharmacist if you have questions. COMMON BRAND NAME(S): Tecentriq What should I tell my care team before I take this medication? They need to know if you have any of these conditions: Allogeneic stem cell transplant (uses someone else's stem cells) Autoimmune diseases, such as Crohn  disease, ulcerative colitis, lupus History of chest radiation Nervous system problems, such as Guillain-Barre syndrome, myasthenia gravis Organ transplant An unusual or allergic reaction to atezolizumab, other medications, foods, dyes, or preservatives Pregnant or trying to get pregnant Breast-feeding How should I use this  medication? This medication is injected into a vein. It is given by your care team in a hospital or clinic setting. A special MedGuide will be given to you before each treatment. Be sure to read this information carefully each time. Talk to your care team about the use of this medication in children. While it may be prescribed for children as young as 2 years for selected conditions, precautions do apply. Overdosage: If you think you have taken too much of this medicine contact a poison control center or emergency room at once. NOTE: This medicine is only for you. Do not share this medicine with others. What if I miss a dose? Keep appointments for follow-up doses. It is important not to miss your dose. Call your care team if you are unable to keep an appointment. What may interact with this medication? Interactions have not been studied. This list may not describe all possible interactions. Give your health care provider a list of all the medicines, herbs, non-prescription drugs, or dietary supplements you use. Also tell them if you smoke, drink alcohol, or use illegal drugs. Some items may interact with your medicine. What should I watch for while using this medication? Your condition will be monitored carefully while you are receiving this medication. You may need blood work done while you are taking this medication. This medication may cause serious skin reactions. They can happen weeks to months after starting the medication. Contact your care team right away if you notice fevers or flu-like symptoms with a rash. The rash may be red or purple and then turn into blisters or peeling of the skin. You may also notice a red rash with swelling of the face, lips, or lymph nodes in your neck or under your arms. Talk to your care team if you may be pregnant. Serious birth defects can occur if you take this medication during pregnancy and for 5 months after the last dose. You will need a negative pregnancy  test before starting this medication. Contraception is recommended while taking this medication and for 5 months after the last dose. Your care team can help you find the option that works for you. Do not breastfeed while taking this medication and for 5 months after the last dose. This medication may cause infertility. Talk to your care team if you are concerned about your fertility. What side effects may I notice from receiving this medication? Side effects that you should report to your doctor or health care professional as soon as possible: Allergic reactions--skin rash, itching, hives, swelling of the face, lips, tongue, or throat Dry cough, shortness of breath or trouble breathing Eye pain, redness, irritation, or discharge with blurry or decreased vision Heart muscle inflammation--unusual weakness or fatigue, shortness of breath, chest pain, fast or irregular heartbeat, dizziness, swelling of the ankles, feet, or hands Hormone gland problems--headache, sensitivity to light, unusual weakness or fatigue, dizziness, fast or irregular heartbeat, increased sensitivity to cold or heat, excessive sweating, constipation, hair loss, increased thirst or amount of urine, tremors or shaking, irritability Infusion reactions--chest pain, shortness of breath or trouble breathing, feeling faint or lightheaded Kidney injury (glomerulonephritis)--decrease in the amount of urine, red or dark brown urine, foamy or bubbly urine, swelling of the  ankles, hands, or feet Liver injury--right upper belly pain, loss of appetite, nausea, light-colored stool, dark yellow or brown urine, yellowing skin or eyes, unusual weakness or fatigue Pain, tingling, or numbness in the hands or feet, muscle weakness, change in vision, confusion or trouble speaking, loss of balance or coordination, trouble walking, seizures Rash, fever, and swollen lymph nodes Redness, blistering, peeling, or loosening of the skin, including inside the  mouth Sudden or severe stomach pain, bloody diarrhea, fever, nausea, vomiting Side effects that usually do not require medical attention (report to your doctor or health care professional if they continue or are bothersome): Bone, joint, or muscle pain Diarrhea Fatigue Loss of appetite Nausea Skin rash This list may not describe all possible side effects. Call your doctor for medical advice about side effects. You may report side effects to FDA at 1-800-FDA-1088. Where should I keep my medication? This medication is given in a hospital or clinic. It will not be stored at home. NOTE: This sheet is a summary. It may not cover all possible information. If you have questions about this medicine, talk to your doctor, pharmacist, or health care provider.  2024 Elsevier/Gold Standard (2023-02-01 00:00:00)

## 2023-07-17 NOTE — Progress Notes (Signed)
 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  INTERVAL HISTORY: Patient returns to clinic today for further evaluation and continuation of maintenance Tecentriq.  He had increased cough and fever approximately 2 weeks ago and was treated for pneumonia, but now feels well and back to his baseline.  He continues to have chronic weakness and fatigue. He also continues to have chronic shortness of breath and requires oxygen majority of the day and at night.  He is tolerating Tecentriq without significant side effects.  He has no neurologic complaints.  He denies any chest pain, cough, or hemoptysis.  He has a good appetite and denies weight loss.  He denies any nausea, vomiting, constipation, or diarrhea.  He has no urinary complaints.  Patient offers no further specific complaints today.  REVIEW OF SYSTEMS:   Review of Systems  Constitutional:  Positive for malaise/fatigue. Negative for fever and weight loss.  HENT: Negative.  Negative for congestion.   Respiratory:  Positive for shortness of breath. Negative for cough and hemoptysis.   Cardiovascular:  Positive for leg swelling. Negative for chest pain.  Gastrointestinal: Negative.  Negative for abdominal pain and nausea.  Genitourinary: Negative.  Negative for dysuria.  Musculoskeletal: Negative.  Negative for back pain.  Skin:  Negative for rash.  Neurological:  Positive for weakness. Negative for dizziness, focal weakness  and headaches.  Endo/Heme/Allergies:  Does not bruise/bleed easily.  Psychiatric/Behavioral: Negative.  Negative for depression. The patient is not nervous/anxious.     As per HPI. Otherwise, a complete review of systems is negative.  PAST MEDICAL HISTORY: Past Medical History:  Diagnosis Date   Asthma    COPD (chronic obstructive pulmonary disease) (HCC)    Hearing loss    Hypertension    Hypothyroidism    Mass of left lung    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HCC)    Prostate enlargement    Shortness of breath    Squamous cell carcinoma of lung, left (HCC)    Thyroid disease    Tobacco abuse     PAST SURGICAL HISTORY: Past Surgical History:  Procedure Laterality Date   CARDIOVERSION N/A    CARDIOVERSION N/A 11/24/2021   Procedure: CARDIOVERSION;  Surgeon: Yvonne Kendall, MD;  Location: [facility] ORS;  Service: Cardiovascular;  Laterality: N/A;   ELECTROMAGNETIC NAVIGATION BROCHOSCOPY Left 10/19/2018   Procedure: ELECTROMAGNETIC NAVIGATION BRONCHOSCOPY LEFT;  Surgeon: Salena Saner, MD;  Location: [facility] ORS;  Service: Cardiopulmonary;  Laterality: Left;   ENDOBRONCHIAL ULTRASOUND Left 10/19/2018   Procedure: ENDOBRONCHIAL ULTRASOUND LEFT;  Surgeon: Salena Saner, MD;  Location: [facility] ORS;  Service: Cardiopulmonary;  Laterality: Left;   IR IMAGING GUIDED PORT INSERTION  03/03/2023   IR THORACENTESIS ASP PLEURAL SPACE W/IMG GUIDE  03/01/2023   MELANOMA EXCISION Left    NO PAST SURGERIES      FAMILY HISTORY: Family History  Problem Relation Age of Onset   Aneurysm Mother    Cancer Father     ADVANCED DIRECTIVES (Y/N):  N  HEALTH MAINTENANCE: Social History   Tobacco Use  Smoking status: Former    Current packs/day: 0.00    Average packs/day: 0.3 packs/day for 62.0 years (15.5 ttl pk-yrs)    Types: Cigarettes    Start date: 09/04/1956    Quit date: 09/05/2018    Years since quitting: 4.8   Smokeless tobacco: Never  Vaping Use   Vaping status: Never Used  Substance  Use Topics   Alcohol use: No    Alcohol/week: 0.0 standard drinks of alcohol   Drug use: No     Colonoscopy:  PAP:  Bone density:  Lipid panel:  No Known Allergies  Current Outpatient Medications  Medication Sig Dispense Refill   albuterol (PROVENTIL HFA;VENTOLIN HFA) 108 (90 Base) MCG/ACT inhaler Inhale 2 puffs into the lungs every 6 (six) hours as needed for wheezing or shortness of breath. 1 Inhaler 0   albuterol (PROVENTIL) (2.5 MG/3ML) 0.083% nebulizer solution Take 2.5 mg by nebulization every 6 (six) hours as needed for wheezing or shortness of breath.     apixaban (ELIQUIS) 5 MG TABS tablet Take 2.5 mg by mouth 2 (two) times daily.     carboxymethylcellulose (REFRESH PLUS) 0.5 % SOLN 1 drop 4 (four) times daily as needed.     chlorpheniramine-HYDROcodone (TUSSIONEX) 10-8 MG/5ML Take 5 mLs by mouth every 12 (twelve) hours as needed for cough. 115 mL 0   finasteride (PROSCAR) 5 MG tablet Take 5 mg by mouth daily.     fluticasone-salmeterol (ADVAIR) 250-50 MCG/ACT AEPB Inhale 1 puff into the lungs in the morning and at bedtime.     glimepiride (AMARYL) 2 MG tablet Take 2 mg by mouth daily with breakfast.     ipratropium-albuterol (DUONEB) 0.5-2.5 (3) MG/3ML SOLN Take 3 mLs by nebulization every 6 (six) hours as needed. 360 mL 3   levothyroxine (SYNTHROID) 200 MCG tablet Take 200 mcg by mouth daily before breakfast.     lidocaine-prilocaine (EMLA) cream Apply to affected area once 30 g 3   ondansetron (ZOFRAN) 8 MG tablet Take 1 tablet (8 mg total) by mouth every 8 (eight) hours as needed for nausea, vomiting or refractory nausea / vomiting. Start on the third day after carboplatin. 60 tablet 2   Oxycodone HCl 10 MG TABS Take 1 tablet (10 mg total) by mouth every 6 (six) hours as needed. Okay to cut tablet in half if needed. 60 tablet 0   OXYGEN Inhale 3 L into the lungs continuous.     potassium chloride SA (KLOR-CON M) 20 MEQ tablet TAKE 1 TABLET BY MOUTH TWICE A DAY 180 tablet 1    prochlorperazine (COMPAZINE) 10 MG tablet Take 1 tablet (10 mg total) by mouth every 6 (six) hours as needed for nausea or vomiting. 60 tablet 2   SEMAGLUTIDE,0.25 OR 0.5MG /DOS, [place] Inject 0.5 mg into the skin once a week.     tamsulosin (FLOMAX) 0.4 MG CAPS capsule 0.8 mg daily.     Tiotropium Bromide Monohydrate (SPIRIVA RESPIMAT) 2.5 MCG/ACT AERS Inhale 2 puffs into the lungs daily. 1 each 11   torsemide (DEMADEX) 20 MG tablet TAKE 1 TABLET BY MOUTH EVERY OTHER DAY 45 tablet 2   vitamin B-12 (CYANOCOBALAMIN) 1000 MCG tablet Take 1,000 mcg by mouth daily.     No current facility-administered medications for this visit.   Facility-Administered Medications Ordered in Other Visits  Medication Dose Route Frequency Provider Last Rate Last Admin   0.9 %  sodium chloride infusion   Intravenous Continuous Jeralyn Ruths, MD 10 mL/hr at 04/05/23 1305 New  Bag at 04/05/23 1305    OBJECTIVE: Vitals:   07/17/23 0909  BP: 132/78  Pulse: 65  Resp: 18  Temp: 98.3 F (36.8 C)  SpO2: 100%      Body mass index is 34.18 kg/m.    ECOG FS:2 - Symptomatic, <50% confined to bed  General: Well-developed, well-nourished, no acute distress.  Sitting in a wheelchair. Eyes: Pink conjunctiva, anicteric sclera. HEENT: Normocephalic, moist mucous membranes. Lungs: No audible wheezing or coughing. Heart: Regular rate and rhythm. Abdomen: Soft, nontender, no obvious distention. Musculoskeletal: No edema, cyanosis, or clubbing. Neuro: Alert, answering all questions appropriately. Cranial nerves grossly intact. Skin: No rashes or petechiae noted. Psych: Normal affect.  LAB RESULTS:  Lab Results  Component Value Date   NA 135 07/17/2023   K 3.6 07/17/2023   CL 104 07/17/2023   CO2 23 07/17/2023   GLUCOSE 121 (H) 07/17/2023   BUN 18 07/17/2023   CREATININE 1.29 (H) 07/17/2023   CALCIUM 8.6 (L) 07/17/2023   PROT 6.5 07/17/2023   ALBUMIN 2.9 (L) 07/17/2023   AST 18 07/17/2023   ALT 10  07/17/2023   ALKPHOS 64 07/17/2023   BILITOT 0.4 07/17/2023   GFRNONAA 56 (L) 07/17/2023   GFRAA 58 (L) 07/18/2019    Lab Results  Component Value Date   WBC 6.2 07/17/2023   NEUTROABS 4.3 07/17/2023   HGB 11.7 (L) 07/17/2023   HCT 37.6 (L) 07/17/2023   MCV 98.9 07/17/2023   PLT 246 07/17/2023     STUDIES: DG Chest 2 View Result Date: 07/11/2023 CLINICAL DATA:  History of small cell lung cancer.  Fever and cough. EXAM: CHEST - 2 VIEW COMPARISON:  Chest radiograph dated 03/20/2023. FINDINGS: Right-sided Port-A-Cath with tip over the cavoatrial junction. Small left pleural effusion with left lung base atelectasis. Left upper lobe streaky densities slightly progressed since the prior radiograph. Probable right lung base atelectasis. No pneumothorax. Stable cardiac silhouette. No acute osseous pathology. IMPRESSION: 1. Small left pleural effusion with left lung base atelectasis. 2. Left upper lobe streaky densities slightly progressed since the prior radiograph. Electronically Signed   By: Elgie Collard M.D.   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  PET scan results from February 14, 2023 reviewed independently and reported as above confirming stage of disease although patient does not have metastatic disease outside his chest.  MRI of the brain on February 14, 2023 was negative for metastatic disease.  Patient completed cycle 4 of carboplatinum, etoposide, and Tecentriq on May 19, 2023.  Repeat PET scan on June 07, 2023 reviewed independently and report as above with significant improvement of disease burden.  Will continue maintenance Tecentriq every 3 weeks for up to 2 years or until progression of disease.  Proceed with cycle 7 of treatment today.  Return to clinic in 3 weeks for further evaluation and consideration of cycle 8.   Consider repeat imaging in May 2025.    Clinical stage IIB squamous cell carcinoma, left upper lobe lung:  Patient underwent biopsy with navigational bronchoscopy on October 19, 2018 confirming diagnosis.  Patient declined surgery and wished to pursue XRT along with concurrent chemotherapy.  Given his stage of disease he did not receive maintenance immunotherapy.  He completed cycle 7 of weekly carboplatinum and Taxol on January 09, 2019 and then completed XRT on  January 14, 2019.   History of melanoma: Unclear stage or depth, although by report was in situ.  Patient had Mohs surgery in fall 2019.  Previous lung biopsy consistent with squamous cell carcinoma. Anxiety/depression: Chronic and unchanged.  Continue current medications as prescribed.  Continue follow-up with primary care physician as well as psychiatry at the [place]. Shortness of breath/cough: Chronic and unchanged.  Patient's last thoracic ultrasound did not reveal any significant fluid.  Continue oxygen as prescribed. Pain: Does not complain of this today.  Continue oxycodone as needed. Peripheral edema: Chronic and unchanged.  Patient has been instructed to increase his torsemide to daily if his blood pressure remains above 110 systolic. Hyponatremia: Resolved. Fevers: Resolved.  I spent a total of 30 minutes reviewing chart data, face-to-face evaluation with the patient, counseling and coordination of care as detailed above.    Patient expressed understanding and was in agreement with this plan. He also understands that He can call clinic at any time with any questions, concerns, or complaints.    Cancer Staging  Small cell lung cancer, left [facility]) Staging form: Lung, AJCC 8th Edition - Clinical stage from 02/24/2023: Stage IVA (cT4, cN2, cM1a) - Signed by Jeralyn Ruths, MD on 02/24/2023  Squamous cell carcinoma lung, left (HCC) Staging form: Lung, AJCC 8th Edition - Clinical stage from 10/27/2018: Stage IIB (cT1c, cN1, cM0) -  Signed by Jeralyn Ruths, MD on 11/16/2018   Jeralyn Ruths, MD   07/17/2023 9:29 AM

## 2023-07-21 ENCOUNTER — Ambulatory Visit: Attending: Cardiology | Admitting: Cardiology

## 2023-07-21 ENCOUNTER — Encounter: Payer: Self-pay | Admitting: Cardiology

## 2023-07-21 VITALS — BP 120/70 | HR 69 | Ht 72.0 in | Wt 249.8 lb

## 2023-07-21 DIAGNOSIS — I4821 Permanent atrial fibrillation: Secondary | ICD-10-CM

## 2023-07-21 DIAGNOSIS — J9611 Chronic respiratory failure with hypoxia: Secondary | ICD-10-CM | POA: Diagnosis not present

## 2023-07-21 DIAGNOSIS — D6869 Other thrombophilia: Secondary | ICD-10-CM | POA: Diagnosis not present

## 2023-07-21 DIAGNOSIS — R0600 Dyspnea, unspecified: Secondary | ICD-10-CM

## 2023-07-21 MED ORDER — APIXABAN 5 MG PO TABS: 5.0000 mg | ORAL_TABLET | Freq: Two times a day (BID) | ORAL | 3 refills | Status: DC

## 2023-07-21 NOTE — Patient Instructions (Signed)
 Medication Instructions:  Increase Eliquis 5 mg twice daily   *If you need a refill on your cardiac medications before your next appointment, please call your pharmacy*   Follow-Up: At [facility], you and your health needs are our priority.  As part of our continuing mission to provide you with exceptional heart care, we have created designated Provider Care Teams.  These Care Teams include your primary Cardiologist (physician) and Advanced Practice Providers (APPs -  Physician Assistants and Nurse Practitioners) who all work together to provide you with the care you need, when you need it.  We recommend signing up for the patient portal called "MyChart".  Sign up information is provided on this After Visit Summary.  MyChart is used to connect with patients for Virtual Visits (Telemedicine).  Patients are able to view lab/test results, encounter notes, upcoming appointments, etc.  Non-urgent messages can be sent to your provider as well.   To learn more about what you can do with MyChart, go to [email or web address].    Your next appointment:   6 month(s)  Provider:   Sherryl Manges, MD or Sherie Don, NP

## 2023-07-21 NOTE — Progress Notes (Signed)
 Electrophysiology Clinic Note    Date:  07/21/2023  Patient ID:  Levi [name], Levi [name] 20-Mar-1941, MRN [id number] PCP:  [facility]  Cardiologist:  Debbe Odea, MD Electrophysiologist: Sherryl Manges, MD   Discussed the use of AI scribe software for clinical note transcription with the patient, who gave verbal consent to proceed.   Patient Profile    Chief Complaint: AFib follow-up  History of Present Illness: Levi [name] is a 83 y.o. male with PMH notable for perm AFib, squamous cell Lung Ca, COPD, HTN, ; seen today for Sherryl Manges, MD for routine electrophysiology followup.   He last saw Dr. Graciela Husbands 10/2022. Long-standing SOB had significantly improved with pulm rehab.   On follow-up today, he feels "weird" currently. He has noticed that he feels "weird"  for about a week after his chemo session. He has had intermittent swelling in his legs, which has improved with diuretics. His weight is currently stable. He has ongoing, severe SOB but this is stable. He was able to walk from waiting room to exam room slowly, which he is usually not able to do.   He continues to take eliquis BID, no bleeding concerns, no missed doses.   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  His wife joins for appt.   Arrhythmia/Device History Multaq - ineffective    ROS:  Please see the history of present illness. All other systems are reviewed and otherwise negative.    Physical Exam    VS:  BP 120/70   Pulse 69   Ht 6' (1.829 m)   Wt 249 lb 12.8 oz (113.3 kg)   SpO2 92%   BMI 33.88 kg/m  BMI: Body mass index is 33.88 kg/m.  Wt Readings from Last 3 Encounters:  07/21/23 249 lb 12.8 oz (113.3 kg)  07/17/23 252 lb (114.3 kg)  07/11/23 249 lb 12.8 oz (113.3 kg)     GEN- The patient is chronically ill- appearing, alert and oriented x 3 today, HOH Lungs- Absent breath sounds L upper lung field, normal work of  breathing.  Heart- Irregularly irregular rate and rhythm, no murmurs, rubs or gallops Extremities- Trace peripheral edema, warm, dry    Studies Reviewed   Previous EP, cardiology notes.    EKG is ordered. Personal review of EKG from today shows:    EKG Interpretation Date/Time:  Friday July 21 2023 13:10:54 EDT Ventricular Rate:  69 PR Interval:    QRS Duration:  104 QT Interval:  388 QTC Calculation: 415 R Axis:   -26  Text Interpretation: Atrial fibrillation with premature ventricular or aberrantly conducted complexes Confirmed by Sherie Don [phone]) on 07/21/2023 1:16:18 PM    NM Myocardial spect, 02/15/2022   Normal pharmacologic myocardial perfusion stress test without evidence of significant ischemia or scar.   Left ventricular systolic function is normal (LVEF 55-65%).   Coronary artery calcification and aortic atherosclerosis are noted on the attenuation correction CT.   Post-cancer treatment changes are noted in the left lung, better evaluated on dedicated chest CT dated 01/31/2022.   This is a low-risk study.    Assessment and Plan     #) perm afib Ventricular rate well-controlled Ongoing SOB, unclear how much arrhythmia contributing given ongoing lung ca with chemo  #) Hypercoag d/t perm afib CHA2DS2-VASc Score = at least 5 [CHF History: 0, HTN History: 1, Diabetes History: 1, Stroke History: 0, Vascular Disease History: 1, Age  Score: 2, Gender Score: 0].  Therefore, the patient's annual risk of stroke is 7.2 %.    Stroke ppx - eliquis Patient has been taking 2.5mg  BID. Discussed with improved kidney function over last several months, he no longer qualifies for reduced dose Increase eliquis to 5mg  BID   #) lung ca #) SOB Ongoing mgmt by Dr. Orlie Dakin SOB appears stable, if not slightly improved today given ability to walk to exam room Encouraged as much activity as patient can tolerate        Current medicines are reviewed at length with the patient  today.   The patient does not have concerns regarding his medicines.  The following changes were made today:   INCREASE eliquis to 5mg  BID  Labs/ tests ordered today include:  Orders Placed This Encounter  Procedures   EKG 12-Lead     Disposition: Follow up with Dr. Jimmey Ralph or EP APP in 6 months, prefer MD to establish care with new MD after Dr. Odessa Fleming retirement.   Of note, he does not want to transfer care to Dr. Lalla Brothers   Signed, Sherie Don, NP  07/21/23  7:39 PM  Electrophysiology [facility]

## 2023-08-07 ENCOUNTER — Encounter: Payer: Self-pay | Admitting: Oncology

## 2023-08-07 ENCOUNTER — Inpatient Hospital Stay: Attending: Oncology

## 2023-08-07 ENCOUNTER — Inpatient Hospital Stay

## 2023-08-07 ENCOUNTER — Inpatient Hospital Stay (HOSPITAL_BASED_OUTPATIENT_CLINIC_OR_DEPARTMENT_OTHER): Admitting: Oncology

## 2023-08-07 DIAGNOSIS — Z79620 Long term (current) use of immunosuppressive biologic: Secondary | ICD-10-CM | POA: Diagnosis not present

## 2023-08-07 DIAGNOSIS — C3412 Malignant neoplasm of upper lobe, left bronchus or lung: Secondary | ICD-10-CM | POA: Diagnosis present

## 2023-08-07 DIAGNOSIS — Z5112 Encounter for antineoplastic immunotherapy: Secondary | ICD-10-CM | POA: Insufficient documentation

## 2023-08-07 DIAGNOSIS — C3492 Malignant neoplasm of unspecified part of left bronchus or lung: Secondary | ICD-10-CM

## 2023-08-07 LAB — CBC WITH DIFFERENTIAL (CANCER CENTER ONLY)
Abs Immature Granulocytes: 0.02 10*3/uL (ref 0.00–0.07)
Basophils Absolute: 0 10*3/uL (ref 0.0–0.1)
Basophils Relative: 1 %
Eosinophils Absolute: 0.2 10*3/uL (ref 0.0–0.5)
Eosinophils Relative: 3 %
HCT: 39.3 % (ref 39.0–52.0)
Hemoglobin: 12.6 g/dL — ABNORMAL LOW (ref 13.0–17.0)
Immature Granulocytes: 0 %
Lymphocytes Relative: 21 %
Lymphs Abs: 1.3 10*3/uL (ref 0.7–4.0)
MCH: 31.3 pg (ref 26.0–34.0)
MCHC: 32.1 g/dL (ref 30.0–36.0)
MCV: 97.8 fL (ref 80.0–100.0)
Monocytes Absolute: 0.7 10*3/uL (ref 0.1–1.0)
Monocytes Relative: 12 %
Neutro Abs: 4 10*3/uL (ref 1.7–7.7)
Neutrophils Relative %: 63 %
Platelet Count: 161 10*3/uL (ref 150–400)
RBC: 4.02 MIL/uL — ABNORMAL LOW (ref 4.22–5.81)
RDW: 13.9 % (ref 11.5–15.5)
WBC Count: 6.2 10*3/uL (ref 4.0–10.5)
nRBC: 0 % (ref 0.0–0.2)

## 2023-08-07 LAB — CMP (CANCER CENTER ONLY)
ALT: 11 U/L (ref 0–44)
AST: 19 U/L (ref 15–41)
Albumin: 3.2 g/dL — ABNORMAL LOW (ref 3.5–5.0)
Alkaline Phosphatase: 74 U/L (ref 38–126)
Anion gap: 7 (ref 5–15)
BUN: 19 mg/dL (ref 8–23)
CO2: 20 mmol/L — ABNORMAL LOW (ref 22–32)
Calcium: 8.4 mg/dL — ABNORMAL LOW (ref 8.9–10.3)
Chloride: 108 mmol/L (ref 98–111)
Creatinine: 1.35 mg/dL — ABNORMAL HIGH (ref 0.61–1.24)
GFR, Estimated: 53 mL/min — ABNORMAL LOW (ref >60)
Glucose, Bld: 128 mg/dL — ABNORMAL HIGH (ref 70–99)
Potassium: 3.4 mmol/L — ABNORMAL LOW (ref 3.5–5.1)
Sodium: 135 mmol/L (ref 135–145)
Total Bilirubin: 0.4 mg/dL (ref 0.0–1.2)
Total Protein: 6.6 g/dL (ref 6.5–8.1)

## 2023-08-07 LAB — MAGNESIUM: Magnesium: 1.5 mg/dL — ABNORMAL LOW (ref 1.7–2.4)

## 2023-08-07 MED ORDER — SODIUM CHLORIDE 0.9 % IV SOLN
INTRAVENOUS | Status: DC
  Filled 2023-08-07: qty 250

## 2023-08-07 MED ORDER — SODIUM CHLORIDE 0.9 % IV SOLN
1200.0000 mg | Freq: Once | INTRAVENOUS | Status: AC
  Administered 2023-08-07: 1200 mg via INTRAVENOUS
  Filled 2023-08-07: qty 20

## 2023-08-07 MED ORDER — LIDOCAINE-PRILOCAINE 2.5-2.5 % EX CREA: TOPICAL_CREAM | CUTANEOUS | 3 refills | Status: DC

## 2023-08-07 NOTE — Progress Notes (Signed)
 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  INTERVAL HISTORY: Patient returns to clinic today for further evaluation and continuation of maintenance Tecentriq.  He currently feels well and is asymptomatic.  He continues to have chronic shortness of breath requiring oxygen supplementation.  He does not complain of weakness or fatigue today.  He is tolerating Tecentriq without significant side effects.  He has no neurologic complaints.  He denies any chest pain, cough, or hemoptysis.  He has a good appetite and denies weight loss.  He denies any nausea, vomiting, constipation, or diarrhea.  He has no urinary complaints.  Patient offers no further specific complaints today.  REVIEW OF SYSTEMS:   Review of Systems  Constitutional: Negative.  Negative for fever, malaise/fatigue and weight loss.  HENT: Negative.  Negative for congestion.   Respiratory:  Positive for shortness of breath. Negative for cough and hemoptysis.   Cardiovascular: Negative.  Negative for chest pain and leg swelling.  Gastrointestinal: Negative.  Negative for abdominal pain and nausea.  Genitourinary: Negative.  Negative for dysuria.  Musculoskeletal: Negative.  Negative for back pain.  Skin:  Negative for rash.  Neurological: Negative.  Negative for dizziness, focal weakness, weakness and headaches.  Endo/Heme/Allergies:  Does not bruise/bleed easily.  Psychiatric/Behavioral: Negative.  Negative for  depression. The patient is not nervous/anxious.     As per HPI. Otherwise, a complete review of systems is negative.  PAST MEDICAL HISTORY: Past Medical History:  Diagnosis Date   Asthma    COPD (chronic obstructive pulmonary disease) (HCC)    Hearing loss    Hypertension    Hypothyroidism    Mass of left lung    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HCC)    Prostate enlargement    Shortness of breath    Squamous cell carcinoma of lung, left (HCC)    Thyroid disease    Tobacco abuse     PAST SURGICAL HISTORY: Past Surgical History:  Procedure Laterality Date   CARDIOVERSION N/A    CARDIOVERSION N/A 11/24/2021   Procedure: CARDIOVERSION;  Surgeon: Yvonne Kendall, MD;  Location: [facility] ORS;  Service: Cardiovascular;  Laterality: N/A;   ELECTROMAGNETIC NAVIGATION BROCHOSCOPY Left 10/19/2018   Procedure: ELECTROMAGNETIC NAVIGATION BRONCHOSCOPY LEFT;  Surgeon: Salena Saner, MD;  Location: [facility] ORS;  Service: Cardiopulmonary;  Laterality: Left;   ENDOBRONCHIAL ULTRASOUND Left 10/19/2018   Procedure: ENDOBRONCHIAL ULTRASOUND LEFT;  Surgeon: Salena Saner, MD;  Location: [facility] ORS;  Service: Cardiopulmonary;  Laterality: Left;   IR IMAGING GUIDED PORT INSERTION  03/03/2023   IR THORACENTESIS ASP PLEURAL SPACE W/IMG GUIDE  03/01/2023   MELANOMA EXCISION Left    NO PAST SURGERIES      FAMILY HISTORY: Family History  Problem Relation Age of Onset   Aneurysm Mother    Cancer Father     ADVANCED DIRECTIVES (Y/N):  N  HEALTH MAINTENANCE: Social History   Tobacco Use   Smoking status: Former    Current packs/day: 0.00    Average packs/day: 0.3 packs/day for 62.0 years (15.5 ttl pk-yrs)  Types: Cigarettes    Start date: 09/04/1956    Quit date: 09/05/2018    Years since quitting: 4.9   Smokeless tobacco: Never  Vaping Use   Vaping status: Never Used  Substance Use Topics   Alcohol use: No    Alcohol/week: 0.0 standard drinks of alcohol   Drug use: No      Colonoscopy:  PAP:  Bone density:  Lipid panel:  No Known Allergies  Current Outpatient Medications  Medication Sig Dispense Refill   albuterol (PROVENTIL HFA;VENTOLIN HFA) 108 (90 Base) MCG/ACT inhaler Inhale 2 puffs into the lungs every 6 (six) hours as needed for wheezing or shortness of breath. 1 Inhaler 0   apixaban (ELIQUIS) 5 MG TABS tablet Take 1 tablet (5 mg total) by mouth 2 (two) times daily. 60 tablet 3   carboxymethylcellulose (REFRESH PLUS) 0.5 % SOLN 1 drop 4 (four) times daily as needed.     chlorpheniramine-HYDROcodone (TUSSIONEX) 10-8 MG/5ML Take 5 mLs by mouth every 12 (twelve) hours as needed for cough. 115 mL 0   finasteride (PROSCAR) 5 MG tablet Take 5 mg by mouth daily.     fluticasone-salmeterol (ADVAIR) 250-50 MCG/ACT AEPB Inhale 1 puff into the lungs in the morning and at bedtime.     ipratropium-albuterol (DUONEB) 0.5-2.5 (3) MG/3ML SOLN Take 3 mLs by nebulization every 6 (six) hours as needed. 360 mL 3   levothyroxine (SYNTHROID) 200 MCG tablet Take 200 mcg by mouth daily before breakfast.     lidocaine-prilocaine (EMLA) cream Apply to affected area once 30 g 3   ondansetron (ZOFRAN) 8 MG tablet Take 1 tablet (8 mg total) by mouth every 8 (eight) hours as needed for nausea, vomiting or refractory nausea / vomiting. Start on the third day after carboplatin. 60 tablet 2   Oxycodone HCl 10 MG TABS Take 1 tablet (10 mg total) by mouth every 6 (six) hours as needed. Okay to cut tablet in half if needed. 60 tablet 0   OXYGEN Inhale 3 L into the lungs continuous.     potassium chloride SA (KLOR-CON M) 20 MEQ tablet TAKE 1 TABLET BY MOUTH TWICE A DAY 180 tablet 1   prochlorperazine (COMPAZINE) 10 MG tablet Take 1 tablet (10 mg total) by mouth every 6 (six) hours as needed for nausea or vomiting. 60 tablet 2   SEMAGLUTIDE,0.25 OR 0.5MG /DOS, [place] Inject 0.5 mg into the skin once a week.     tamsulosin (FLOMAX) 0.4 MG CAPS capsule 0.8 mg daily.     Tiotropium Bromide  Monohydrate (SPIRIVA RESPIMAT) 2.5 MCG/ACT AERS Inhale 2 puffs into the lungs daily. 1 each 11   torsemide (DEMADEX) 20 MG tablet TAKE 1 TABLET BY MOUTH EVERY OTHER DAY (Patient taking differently: Take 20 mg by mouth daily.) 45 tablet 2   vitamin B-12 (CYANOCOBALAMIN) 1000 MCG tablet Take 1,000 mcg by mouth daily.     albuterol (PROVENTIL) (2.5 MG/3ML) 0.083% nebulizer solution Take 2.5 mg by nebulization every 6 (six) hours as needed for wheezing or shortness of breath. (Patient not taking: Reported on 08/07/2023)     glimepiride (AMARYL) 2 MG tablet Take 2 mg by mouth daily with breakfast. (Patient not taking: Reported on 08/07/2023)     No current facility-administered medications for this visit.   Facility-Administered Medications Ordered in Other Visits  Medication Dose Route Frequency Provider Last Rate Last Admin   0.9 %  sodium chloride infusion   Intravenous Continuous Jeralyn Ruths, MD 10 mL/hr at 04/05/23 1305 New  Bag at 04/05/23 1305   0.9 %  sodium chloride infusion   Intravenous Continuous Keiry Kowal, Tollie Pizza, MD       atezolizumab [facility]) 1,200 mg in sodium chloride 0.9 % 250 mL chemo infusion  1,200 mg Intravenous Once Jeralyn Ruths, MD        OBJECTIVE: Vitals:   08/07/23 0919  BP: 124/67  Pulse: 66  Temp: 98.1 F (36.7 C)  SpO2: 95%      Body mass index is 34.04 kg/m.    ECOG FS:1 - Symptomatic but completely ambulatory  General: Well-developed, well-nourished, no acute distress. Eyes: Pink conjunctiva, anicteric sclera. HEENT: Normocephalic, moist mucous membranes. Lungs: No audible wheezing or coughing. Heart: Regular rate and rhythm. Abdomen: Soft, nontender, no obvious distention. Musculoskeletal: No edema, cyanosis, or clubbing. Neuro: Alert, answering all questions appropriately. Cranial nerves grossly intact. Skin: No rashes or petechiae noted. Psych: Normal affect.  LAB RESULTS:  Lab Results  Component Value Date   NA 135 08/07/2023    K 3.4 (L) 08/07/2023   CL 108 08/07/2023   CO2 20 (L) 08/07/2023   GLUCOSE 128 (H) 08/07/2023   BUN 19 08/07/2023   CREATININE 1.35 (H) 08/07/2023   CALCIUM 8.4 (L) 08/07/2023   PROT 6.6 08/07/2023   ALBUMIN 3.2 (L) 08/07/2023   AST 19 08/07/2023   ALT 11 08/07/2023   ALKPHOS 74 08/07/2023   BILITOT 0.4 08/07/2023   GFRNONAA 53 (L) 08/07/2023   GFRAA 58 (L) 07/18/2019    Lab Results  Component Value Date   WBC 6.2 08/07/2023   NEUTROABS 4.0 08/07/2023   HGB 12.6 (L) 08/07/2023   HCT 39.3 08/07/2023   MCV 97.8 08/07/2023   PLT 161 08/07/2023     STUDIES: DG Chest 2 View Result Date: 07/11/2023 CLINICAL DATA:  History of small cell lung cancer.  Fever and cough. EXAM: CHEST - 2 VIEW COMPARISON:  Chest radiograph dated 03/20/2023. FINDINGS: Right-sided Port-A-Cath with tip over the cavoatrial junction. Small left pleural effusion with left lung base atelectasis. Left upper lobe streaky densities slightly progressed since the prior radiograph. Probable right lung base atelectasis. No pneumothorax. Stable cardiac silhouette. No acute osseous pathology. IMPRESSION: 1. Small left pleural effusion with left lung base atelectasis. 2. Left upper lobe streaky densities slightly progressed since the prior radiograph. Electronically Signed   By: Elgie Collard M.D.   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  PET scan results from February 14, 2023 reviewed independently and reported as above confirming stage of disease although patient does not have metastatic disease outside his chest.  MRI of the brain on February 14, 2023 was negative for metastatic disease.  Patient completed cycle 4 of carboplatinum, etoposide, and Tecentriq on May 19, 2023.  Repeat PET scan on June 07, 2023 reviewed independently with significant improvement of  disease burden.  Will continue maintenance Tecentriq every 3 weeks for up to 2 years or until progression of disease.  Patient initiated maintenance treatment on June 05, 2023.  Proceed with cycle 8 today.  Return to clinic in 3 weeks for further evaluation and consideration of cycle 9.  Consider repeat imaging in May 2025.    Clinical stage IIB squamous cell carcinoma, left upper lobe lung:  Patient underwent biopsy with navigational bronchoscopy on October 19, 2018 confirming diagnosis.  Patient declined surgery and wished to pursue XRT along with concurrent chemotherapy.  Given his stage of disease he did not receive maintenance immunotherapy.  He completed cycle 7 of weekly carboplatinum and Taxol on January 09, 2019 and then completed XRT on January 14, 2019.   History of melanoma: Unclear stage or depth, although by report was in situ.  Patient had Mohs surgery in fall 2019.  Previous lung biopsy consistent with squamous cell carcinoma. Anxiety/depression: Improved.  Continue current medications as prescribed.  Continue follow-up with primary care physician as well as psychiatry at the [place]. Shortness of breath/cough: Chronic and unchanged.  Patient's last thoracic ultrasound did not reveal any significant fluid.  Continue supplemental oxygen as needed. Pain: Does not complain of this today.  Continue oxycodone as needed. Peripheral edema: Chronic and unchanged.  Patient has been instructed to increase his torsemide to daily if his blood pressure remains above 110 systolic. Hypokalemia: Mild, monitor. Hypomagnesia: Recommended patient take OTC oral supplementation. Renal insufficiency: Chronic and unchanged.  Patient's creatinine is 1.35 today.   Patient expressed understanding and was in agreement with this plan. He also understands that He can call clinic at any time with any questions, concerns, or complaints.    Cancer Staging  Small cell lung cancer, left [facility]) Staging form: Lung, AJCC  8th Edition - Clinical stage from 02/24/2023: Stage IVA (cT4, cN2, cM1a) - Signed by Jeralyn Ruths, MD on 02/24/2023  Squamous cell carcinoma lung, left (HCC) Staging form: Lung, AJCC 8th Edition - Clinical stage from 10/27/2018: Stage IIB (cT1c, cN1, cM0) - Signed by Jeralyn Ruths, MD on 11/16/2018   Jeralyn Ruths, MD   08/07/2023 9:59 AM

## 2023-08-07 NOTE — Patient Instructions (Signed)
 [facility] - A DEPT OF [facility]  Discharge Instructions: Thank you for choosing [facility] to provide your oncology and hematology care.  If you have a lab appointment with the Cancer Center, please go directly to the Cancer Center and check in at the registration area.  Wear comfortable clothing and clothing appropriate for easy access to any Portacath or PICC line.   We strive to give you quality time with your provider. You may need to reschedule your appointment if you arrive late (15 or more minutes).  Arriving late affects you and other patients whose appointments are after yours.  Also, if you miss three or more appointments without notifying the office, you may be dismissed from the clinic at the provider's discretion.      For prescription refill requests, have your pharmacy contact our office and allow 72 hours for refills to be completed.    Today you received the following chemotherapy and/or immunotherapy agents Tecentriq      To help prevent nausea and vomiting after your treatment, we encourage you to take your nausea medication as directed.  BELOW ARE SYMPTOMS THAT SHOULD BE REPORTED IMMEDIATELY: *FEVER GREATER THAN 100.4 F (38 C) OR HIGHER *CHILLS OR SWEATING *NAUSEA AND VOMITING THAT IS NOT CONTROLLED WITH YOUR NAUSEA MEDICATION *UNUSUAL SHORTNESS OF BREATH *UNUSUAL BRUISING OR BLEEDING *URINARY PROBLEMS (pain or burning when urinating, or frequent urination) *BOWEL PROBLEMS (unusual diarrhea, constipation, pain near the anus) TENDERNESS IN MOUTH AND THROAT WITH OR WITHOUT PRESENCE OF ULCERS (sore throat, sores in mouth, or a toothache) UNUSUAL RASH, SWELLING OR PAIN  UNUSUAL VAGINAL DISCHARGE OR ITCHING   Items with * indicate a potential emergency and should be followed up as soon as possible or go to the Emergency Department if any problems should occur.  Please show the CHEMOTHERAPY ALERT CARD or IMMUNOTHERAPY  ALERT CARD at check-in to the Emergency Department and triage nurse.  Should you have questions after your visit or need to cancel or reschedule your appointment, please contact [facility] - A DEPT OF [facility]  [phone] and follow the prompts.  Office hours are 8:00 a.m. to 4:30 p.m. Monday - Friday. Please note that voicemails left after 4:00 p.m. may not be returned until the following business day.  We are closed weekends and major holidays. You have access to a nurse at all times for urgent questions. Please call the main number to the clinic [phone] and follow the prompts.  For any non-urgent questions, you may also contact your provider using MyChart. We now offer e-Visits for anyone 20 and older to request care online for non-urgent symptoms. For details visit [email or web address].   Also download the MyChart app! Go to the app store, search "MyChart", open the app, select [place], and log in with your MyChart username and password.

## 2023-08-08 ENCOUNTER — Encounter: Payer: Self-pay | Admitting: Oncology

## 2023-08-08 ENCOUNTER — Other Ambulatory Visit: Payer: Self-pay | Admitting: Oncology

## 2023-08-08 DIAGNOSIS — C3492 Malignant neoplasm of unspecified part of left bronchus or lung: Secondary | ICD-10-CM

## 2023-08-28 ENCOUNTER — Inpatient Hospital Stay (HOSPITAL_BASED_OUTPATIENT_CLINIC_OR_DEPARTMENT_OTHER): Admitting: Oncology

## 2023-08-28 ENCOUNTER — Encounter: Payer: Self-pay | Admitting: Oncology

## 2023-08-28 ENCOUNTER — Other Ambulatory Visit: Payer: Self-pay | Admitting: *Deleted

## 2023-08-28 ENCOUNTER — Inpatient Hospital Stay

## 2023-08-28 ENCOUNTER — Other Ambulatory Visit: Payer: Self-pay

## 2023-08-28 VITALS — BP 126/73 | HR 56 | Temp 98.2°F | Resp 18 | Ht 72.0 in | Wt 251.0 lb

## 2023-08-28 DIAGNOSIS — C3492 Malignant neoplasm of unspecified part of left bronchus or lung: Secondary | ICD-10-CM

## 2023-08-28 DIAGNOSIS — Z5112 Encounter for antineoplastic immunotherapy: Secondary | ICD-10-CM | POA: Diagnosis not present

## 2023-08-28 LAB — CBC WITH DIFFERENTIAL (CANCER CENTER ONLY)
Abs Immature Granulocytes: 0.03 10*3/uL (ref 0.00–0.07)
Basophils Absolute: 0 10*3/uL (ref 0.0–0.1)
Basophils Relative: 1 %
Eosinophils Absolute: 0.2 10*3/uL (ref 0.0–0.5)
Eosinophils Relative: 3 %
HCT: 41 % (ref 39.0–52.0)
Hemoglobin: 13.2 g/dL (ref 13.0–17.0)
Immature Granulocytes: 1 %
Lymphocytes Relative: 19 %
Lymphs Abs: 1.2 10*3/uL (ref 0.7–4.0)
MCH: 30 pg (ref 26.0–34.0)
MCHC: 32.2 g/dL (ref 30.0–36.0)
MCV: 93.2 fL (ref 80.0–100.0)
Monocytes Absolute: 0.8 10*3/uL (ref 0.1–1.0)
Monocytes Relative: 13 %
Neutro Abs: 3.9 10*3/uL (ref 1.7–7.7)
Neutrophils Relative %: 63 %
Platelet Count: 167 10*3/uL (ref 150–400)
RBC: 4.4 MIL/uL (ref 4.22–5.81)
RDW: 13.8 % (ref 11.5–15.5)
WBC Count: 6.1 10*3/uL (ref 4.0–10.5)
nRBC: 0 % (ref 0.0–0.2)

## 2023-08-28 LAB — CMP (CANCER CENTER ONLY)
ALT: 12 U/L (ref 0–44)
AST: 21 U/L (ref 15–41)
Albumin: 3.2 g/dL — ABNORMAL LOW (ref 3.5–5.0)
Alkaline Phosphatase: 83 U/L (ref 38–126)
Anion gap: 9 (ref 5–15)
BUN: 16 mg/dL (ref 8–23)
CO2: 21 mmol/L — ABNORMAL LOW (ref 22–32)
Calcium: 8.8 mg/dL — ABNORMAL LOW (ref 8.9–10.3)
Chloride: 102 mmol/L (ref 98–111)
Creatinine: 1.25 mg/dL — ABNORMAL HIGH (ref 0.61–1.24)
GFR, Estimated: 58 mL/min — ABNORMAL LOW (ref >60)
Glucose, Bld: 116 mg/dL — ABNORMAL HIGH (ref 70–99)
Potassium: 3.9 mmol/L (ref 3.5–5.1)
Sodium: 132 mmol/L — ABNORMAL LOW (ref 135–145)
Total Bilirubin: 0.5 mg/dL (ref 0.0–1.2)
Total Protein: 6.6 g/dL (ref 6.5–8.1)

## 2023-08-28 LAB — MAGNESIUM: Magnesium: 1.7 mg/dL (ref 1.7–2.4)

## 2023-08-28 MED ORDER — SODIUM CHLORIDE 0.9 % IV SOLN
INTRAVENOUS | Status: DC
  Filled 2023-08-28: qty 250

## 2023-08-28 MED ORDER — SODIUM CHLORIDE 0.9 % IV SOLN
1200.0000 mg | Freq: Once | INTRAVENOUS | Status: AC
  Administered 2023-08-28: 1200 mg via INTRAVENOUS
  Filled 2023-08-28: qty 20

## 2023-08-28 MED ORDER — OXYCODONE HCL 10 MG PO TABS: 10.0000 mg | ORAL_TABLET | Freq: Four times a day (QID) | ORAL | 0 refills | Status: AC | PRN

## 2023-08-28 NOTE — Progress Notes (Signed)
 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  INTERVAL HISTORY: Patient returns to clinic today for further evaluation and continuation of maintenance Tecentriq .  He had 1 episode 1 to 2 weeks ago where the room was spinning he felt nauseous and had to sit down.  It resolved on its own and has not recurred.  He otherwise feels well.  He continues to have chronic shortness of breath requiring oxygen supplementation.  He does not complain of weakness or fatigue today.  He is tolerating Tecentriq  without significant side effects.  He has no neurologic complaints.  He denies any chest pain, cough, or hemoptysis.  He has a good appetite and denies weight loss.  He denies any nausea, vomiting, constipation, or diarrhea.  He has no urinary complaints.  Patient offers no further specific complaints today.  REVIEW OF SYSTEMS:   Review of Systems  Constitutional: Negative.  Negative for fever, malaise/fatigue and weight loss.  HENT: Negative.  Negative for congestion.   Respiratory:  Positive for shortness of breath. Negative for cough and hemoptysis.   Cardiovascular: Negative.  Negative for chest pain and leg swelling.  Gastrointestinal: Negative.  Negative for abdominal pain and nausea.  Genitourinary: Negative.  Negative for dysuria.  Musculoskeletal: Negative.  Negative for back pain.  Skin:  Negative for rash.  Neurological: Negative.  Negative for dizziness, focal weakness,  weakness and headaches.  Endo/Heme/Allergies:  Does not bruise/bleed easily.  Psychiatric/Behavioral: Negative.  Negative for depression. The patient is not nervous/anxious.     As per HPI. Otherwise, a complete review of systems is negative.  PAST MEDICAL HISTORY: Past Medical History:  Diagnosis Date   Asthma    COPD (chronic obstructive pulmonary disease) (HCC)    Hearing loss    Hypertension    Hypothyroidism    Mass of left lung    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HCC)    Prostate enlargement    Shortness of breath    Squamous cell carcinoma of lung, left (HCC)    Thyroid  disease    Tobacco abuse     PAST SURGICAL HISTORY: Past Surgical History:  Procedure Laterality Date   CARDIOVERSION N/A    CARDIOVERSION N/A 11/24/2021   Procedure: CARDIOVERSION;  Surgeon: Sammy Crisp, MD;  Location: [facility] ORS;  Service: Cardiovascular;  Laterality: N/A;   ELECTROMAGNETIC NAVIGATION BROCHOSCOPY Left 10/19/2018   Procedure: ELECTROMAGNETIC NAVIGATION BRONCHOSCOPY LEFT;  Surgeon: Marc Senior, MD;  Location: [facility] ORS;  Service: Cardiopulmonary;  Laterality: Left;   ENDOBRONCHIAL ULTRASOUND Left 10/19/2018   Procedure: ENDOBRONCHIAL ULTRASOUND LEFT;  Surgeon: Marc Senior, MD;  Location: [facility] ORS;  Service: Cardiopulmonary;  Laterality: Left;   IR IMAGING GUIDED PORT INSERTION  03/03/2023   IR THORACENTESIS ASP PLEURAL SPACE W/IMG GUIDE  03/01/2023   MELANOMA EXCISION Left    NO PAST SURGERIES      FAMILY HISTORY: Family History  Problem Relation Age of Onset   Aneurysm Mother    Cancer Father     ADVANCED DIRECTIVES (Y/N):  N  HEALTH MAINTENANCE: Social  History   Tobacco Use   Smoking status: Former    Current packs/day: 0.00    Average packs/day: 0.3 packs/day for 62.0 years (15.5 ttl pk-yrs)    Types: Cigarettes    Start date: 09/04/1956    Quit date: 09/05/2018    Years since quitting: 4.9   Smokeless tobacco: Never  Vaping Use   Vaping status: Never Used   Substance Use Topics   Alcohol use: No    Alcohol/week: 0.0 standard drinks of alcohol   Drug use: No     Colonoscopy:  PAP:  Bone density:  Lipid panel:  No Known Allergies  Current Outpatient Medications  Medication Sig Dispense Refill   albuterol  (PROVENTIL  HFA;VENTOLIN  HFA) 108 (90 Base) MCG/ACT inhaler Inhale 2 puffs into the lungs every 6 (six) hours as needed for wheezing or shortness of breath. 1 Inhaler 0   apixaban  (ELIQUIS ) 5 MG TABS tablet Take 1 tablet (5 mg total) by mouth 2 (two) times daily. 60 tablet 3   carboxymethylcellulose (REFRESH PLUS) 0.5 % SOLN 1 drop 4 (four) times daily as needed.     chlorpheniramine-HYDROcodone  (TUSSIONEX) 10-8 MG/5ML Take 5 mLs by mouth every 12 (twelve) hours as needed for cough. 115 mL 0   finasteride (PROSCAR) 5 MG tablet Take 5 mg by mouth daily.     fluticasone-salmeterol (ADVAIR) 250-50 MCG/ACT AEPB Inhale 1 puff into the lungs in the morning and at bedtime.     ipratropium-albuterol  (DUONEB) 0.5-2.5 (3) MG/3ML SOLN Take 3 mLs by nebulization every 6 (six) hours as needed. 360 mL 3   levothyroxine  (SYNTHROID ) 200 MCG tablet Take 200 mcg by mouth daily before breakfast.     lidocaine -prilocaine  (EMLA ) cream Apply to affected area once 30 g 3   Magnesium  Glycinate 120 MG CAPS Take 400 mg by mouth.     ondansetron  (ZOFRAN ) 8 MG tablet Take 1 tablet (8 mg total) by mouth every 8 (eight) hours as needed for nausea, vomiting or refractory nausea / vomiting. Start on the third day after carboplatin . 60 tablet 2   Oxycodone  HCl 10 MG TABS Take 1 tablet (10 mg total) by mouth every 6 (six) hours as needed. Okay to cut tablet in half if needed. 60 tablet 0   OXYGEN Inhale 3 L into the lungs continuous.     potassium chloride  SA (KLOR-CON  M) 20 MEQ tablet TAKE 1 TABLET BY MOUTH TWICE A DAY 180 tablet 1   prochlorperazine  (COMPAZINE ) 10 MG tablet Take 1 tablet (10 mg total) by mouth every 6 (six) hours as needed for nausea or vomiting. 60 tablet  2   SEMAGLUTIDE,0.25 OR 0.5MG /DOS, [place] Inject 0.5 mg into the skin once a week.     tamsulosin  (FLOMAX ) 0.4 MG CAPS capsule 0.8 mg daily.     Tiotropium Bromide Monohydrate  (SPIRIVA  RESPIMAT) 2.5 MCG/ACT AERS Inhale 2 puffs into the lungs daily. 1 each 11   torsemide  (DEMADEX ) 20 MG tablet TAKE 1 TABLET BY MOUTH EVERY OTHER DAY (Patient taking differently: Take 20 mg by mouth daily.) 45 tablet 2   vitamin B-12 (CYANOCOBALAMIN ) 1000 MCG tablet Take 1,000 mcg by mouth daily.     albuterol  (PROVENTIL ) (2.5 MG/3ML) 0.083% nebulizer solution Take 2.5 mg by nebulization every 6 (six) hours as needed for wheezing or shortness of breath. (Patient not taking: Reported on 07/21/2023)     glimepiride  (AMARYL ) 2 MG tablet Take 2 mg by mouth daily with breakfast. (Patient not taking: Reported on 07/21/2023)     No  current facility-administered medications for this visit.   Facility-Administered Medications Ordered in Other Visits  Medication Dose Route Frequency Provider Last Rate Last Admin   0.9 %  sodium chloride  infusion   Intravenous Continuous Shellie Dials, MD 10 mL/hr at 04/05/23 1305 New Bag at 04/05/23 1305    OBJECTIVE: Vitals:   08/28/23 0853  BP: 126/73  Pulse: (!) 56  Resp: 18  Temp: 98.2 F (36.8 C)  SpO2: 92%      Body mass index is 34.04 kg/m.    ECOG FS:1 - Symptomatic but completely ambulatory  General: Well-developed, well-nourished, no acute distress. Eyes: Pink conjunctiva, anicteric sclera. HEENT: Normocephalic, moist mucous membranes. Lungs: No audible wheezing or coughing. Heart: Regular rate and rhythm. Abdomen: Soft, nontender, no obvious distention. Musculoskeletal: No edema, cyanosis, or clubbing. Neuro: Alert, answering all questions appropriately. Cranial nerves grossly intact. Skin: No rashes or petechiae noted. Psych: Normal affect.  LAB RESULTS:  Lab Results  Component Value Date   NA 132 (L) 08/28/2023   K 3.9 08/28/2023   CL 102 08/28/2023    CO2 21 (L) 08/28/2023   GLUCOSE 116 (H) 08/28/2023   BUN 16 08/28/2023   CREATININE 1.25 (H) 08/28/2023   CALCIUM 8.8 (L) 08/28/2023   PROT 6.6 08/28/2023   ALBUMIN 3.2 (L) 08/28/2023   AST 21 08/28/2023   ALT 12 08/28/2023   ALKPHOS 83 08/28/2023   BILITOT 0.5 08/28/2023   GFRNONAA 58 (L) 08/28/2023   GFRAA 58 (L) 07/18/2019    Lab Results  Component Value Date   WBC 6.1 08/28/2023   NEUTROABS 3.9 08/28/2023   HGB 13.2 08/28/2023   HCT 41.0 08/28/2023   MCV 93.2 08/28/2023   PLT 167 08/28/2023     STUDIES: No results found.    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  PET scan results from February 14, 2023 reviewed independently and reported as above confirming stage of disease although patient does not have metastatic disease outside his chest.  MRI of the brain on February 14, 2023 was negative for metastatic disease.  Patient completed cycle 4 of carboplatinum, etoposide , and Tecentriq  on May 19, 2023.  Repeat PET scan on June 07, 2023 reviewed independently with significant improvement of disease burden.  Will continue maintenance Tecentriq  every 3 weeks for up to 2 years or until progression of disease.  Patient initiated maintenance treatment on June 05, 2023.  Proceed with cycle 9 of treatment today.  Return to clinic in 3 weeks for further evaluation and consideration of cycle 10.  Will repeat imaging with brain MRI and CT scans prior to next clinic visit.   Clinical stage IIB squamous cell carcinoma, left upper lobe lung:  Patient underwent biopsy with navigational bronchoscopy on October 19, 2018 confirming diagnosis.  Patient declined surgery and wished to pursue XRT along with concurrent chemotherapy.  Given his stage of disease he did not receive maintenance immunotherapy.  He completed cycle 7 of weekly carboplatinum and Taxol  on  January 09, 2019 and then completed XRT on January 14, 2019.   History of melanoma: Unclear stage or depth, although by report was in situ.  Patient had Mohs surgery in fall 2019.  Previous lung biopsy consistent with squamous cell carcinoma. Anxiety/depression: Improved.  Continue current medications as prescribed.  Continue follow-up with primary care physician as well [facility]. Shortness of breath/cough: Chronic and unchanged.  Patient's last thoracic ultrasound did not reveal any significant fluid.  Continue supplemental oxygen as needed. Pain: Does not complain of this today.  Patient was given a refill of his oxycodone . Peripheral edema: Chronic and unchanged.  Patient has been instructed to increase his torsemide  to daily if his blood pressure remains above 110 systolic. Hypokalemia: Resolved. Hypomagnesia: Resolved.  Continue OTC oral supplementation. Hyponatremia: Mild, monitor. Renal insufficiency: Creatinine mildly improved to 1.25. Dizziness: Repeat MRI of the brain as above.   Patient expressed understanding and was in agreement with this plan. He also understands that He can call clinic at any time with any questions, concerns, or complaints.    Cancer Staging  Small cell lung cancer, left [facility]) Staging form: Lung, AJCC 8th Edition - Clinical stage from 02/24/2023: Stage IVA (cT4, cN2, cM1a) - Signed by Shellie Dials, MD on 02/24/2023  Squamous cell carcinoma lung, left (HCC) Staging form: Lung, AJCC 8th Edition - Clinical stage from 10/27/2018: Stage IIB (cT1c, cN1, cM0) - Signed by Shellie Dials, MD on 11/16/2018   Shellie Dials, MD   08/28/2023 9:26 AM

## 2023-08-28 NOTE — Progress Notes (Signed)
 States he had a spell last week of dizziness. He states the room just started spinning randomly as he was walking through his house. Has not anymore issues since. States that he does not "feel right" and since the dizziness episode his O2 has been lower than usual. Also having more weakness.

## 2023-08-28 NOTE — Patient Instructions (Signed)
 [facility] - A DEPT OF [facility]  Discharge Instructions: Thank you for choosing [facility] to provide your oncology and hematology care.  If you have a lab appointment with the Cancer Center, please go directly to the Cancer Center and check in at the registration area.  Wear comfortable clothing and clothing appropriate for easy access to any Portacath or PICC line.   We strive to give you quality time with your provider. You may need to reschedule your appointment if you arrive late (15 or more minutes).  Arriving late affects you and other patients whose appointments are after yours.  Also, if you miss three or more appointments without notifying the office, you may be dismissed from the clinic at the provider's discretion.      For prescription refill requests, have your pharmacy contact our office and allow 72 hours for refills to be completed.    Today you received the following chemotherapy and/or immunotherapy agents Tecentriq      To help prevent nausea and vomiting after your treatment, we encourage you to take your nausea medication as directed.  BELOW ARE SYMPTOMS THAT SHOULD BE REPORTED IMMEDIATELY: *FEVER GREATER THAN 100.4 F (38 C) OR HIGHER *CHILLS OR SWEATING *NAUSEA AND VOMITING THAT IS NOT CONTROLLED WITH YOUR NAUSEA MEDICATION *UNUSUAL SHORTNESS OF BREATH *UNUSUAL BRUISING OR BLEEDING *URINARY PROBLEMS (pain or burning when urinating, or frequent urination) *BOWEL PROBLEMS (unusual diarrhea, constipation, pain near the anus) TENDERNESS IN MOUTH AND THROAT WITH OR WITHOUT PRESENCE OF ULCERS (sore throat, sores in mouth, or a toothache) UNUSUAL RASH, SWELLING OR PAIN  UNUSUAL VAGINAL DISCHARGE OR ITCHING   Items with * indicate a potential emergency and should be followed up as soon as possible or go to the Emergency Department if any problems should occur.  Please show the CHEMOTHERAPY ALERT CARD or IMMUNOTHERAPY  ALERT CARD at check-in to the Emergency Department and triage nurse.  Should you have questions after your visit or need to cancel or reschedule your appointment, please contact [facility] - A DEPT OF [facility]  [phone] and follow the prompts.  Office hours are 8:00 a.m. to 4:30 p.m. Monday - Friday. Please note that voicemails left after 4:00 p.m. may not be returned until the following business day.  We are closed weekends and major holidays. You have access to a nurse at all times for urgent questions. Please call the main number to the clinic [phone] and follow the prompts.  For any non-urgent questions, you may also contact your provider using MyChart. We now offer e-Visits for anyone 20 and older to request care online for non-urgent symptoms. For details visit [email or web address].   Also download the MyChart app! Go to the app store, search "MyChart", open the app, select [place], and log in with your MyChart username and password.

## 2023-08-30 ENCOUNTER — Inpatient Hospital Stay (HOSPITAL_BASED_OUTPATIENT_CLINIC_OR_DEPARTMENT_OTHER): Payer: Medicare Other | Admitting: Hospice and Palliative Medicine

## 2023-08-30 ENCOUNTER — Other Ambulatory Visit: Payer: Self-pay | Admitting: Internal Medicine

## 2023-08-30 DIAGNOSIS — C3492 Malignant neoplasm of unspecified part of left bronchus or lung: Secondary | ICD-10-CM

## 2023-08-30 NOTE — Progress Notes (Signed)
 I spoke with patient's wife.  She reports that patient is doing well.  Patient had company visiting him and he was unable to talk.  Will reschedule.

## 2023-08-31 ENCOUNTER — Telehealth: Payer: Self-pay

## 2023-08-31 ENCOUNTER — Encounter: Payer: Self-pay | Admitting: Oncology

## 2023-08-31 NOTE — Telephone Encounter (Signed)
 Please advise if okay to refill Torsemide  20 mg one tablet daily.  The patient was on Torsemide  20 mg one tablet every other day. Dr. Adrian Alba changed the dose on 08/28/2023 to Torsemide  20 mg one tablet daily.

## 2023-09-01 ENCOUNTER — Other Ambulatory Visit: Payer: Self-pay | Admitting: *Deleted

## 2023-09-01 DIAGNOSIS — C3492 Malignant neoplasm of unspecified part of left bronchus or lung: Secondary | ICD-10-CM

## 2023-09-01 MED ORDER — TORSEMIDE 20 MG PO TABS: 20.0000 mg | ORAL_TABLET | Freq: Every day | ORAL | 0 refills | Status: AC

## 2023-09-01 NOTE — Telephone Encounter (Signed)
 RX sent to pharmacy as requested below.   Scheduling team,  Can you reach out to patient to get scheduled for follow up with gen cards.  Thank you!

## 2023-09-04 ENCOUNTER — Ambulatory Visit
Admission: RE | Admit: 2023-09-04 | Discharge: 2023-09-04 | Disposition: A | Discharge status: Home / Self Care | Source: Ambulatory Visit | Attending: Oncology | Admitting: Oncology

## 2023-09-04 ENCOUNTER — Inpatient Hospital Stay: Admission: RE | Admit: 2023-09-04 | Source: Ambulatory Visit

## 2023-09-04 DIAGNOSIS — C3492 Malignant neoplasm of unspecified part of left bronchus or lung: Secondary | ICD-10-CM

## 2023-09-04 MED ORDER — IOPAMIDOL (ISOVUE-370) INJECTION 76%
100.0000 mL | Freq: Once | INTRAVENOUS | Status: AC | PRN
  Administered 2023-09-04: 100 mL via INTRAVENOUS

## 2023-09-04 MED ORDER — SODIUM CHLORIDE 0.9% FLUSH
10.0000 mL | INTRAVENOUS | Status: DC | PRN
  Administered 2023-09-04: 10 mL via INTRAVENOUS

## 2023-09-04 MED ORDER — HEPARIN SOD (PORK) LOCK FLUSH 100 UNIT/ML IV SOLN
500.0000 [IU] | Freq: Once | INTRAVENOUS | Status: AC
  Administered 2023-09-04: 500 [IU] via INTRAVENOUS

## 2023-09-06 ENCOUNTER — Ambulatory Visit: Admitting: Student in an Organized Health Care Education/Training Program

## 2023-09-06 ENCOUNTER — Telehealth: Payer: Self-pay | Admitting: *Deleted

## 2023-09-06 ENCOUNTER — Encounter: Payer: Self-pay | Admitting: Student in an Organized Health Care Education/Training Program

## 2023-09-06 VITALS — BP 132/84 | HR 74 | Temp 97.7°F | Ht 72.0 in | Wt 250.8 lb

## 2023-09-06 DIAGNOSIS — J439 Emphysema, unspecified: Secondary | ICD-10-CM | POA: Diagnosis not present

## 2023-09-06 DIAGNOSIS — J910 Malignant pleural effusion: Secondary | ICD-10-CM | POA: Diagnosis not present

## 2023-09-06 DIAGNOSIS — C3492 Malignant neoplasm of unspecified part of left bronchus or lung: Secondary | ICD-10-CM | POA: Diagnosis not present

## 2023-09-06 DIAGNOSIS — J9611 Chronic respiratory failure with hypoxia: Secondary | ICD-10-CM | POA: Diagnosis not present

## 2023-09-06 NOTE — Telephone Encounter (Signed)
 The wife is the one that called in but Mr. Levi [name] is the one that really wants to talk to Dr. Dr. Adrian Alba because he is his doctor and that is the only one he feels like Dr. Adrian Alba says.  I did tell the wife that at this late date and time that they will call and talk to them tomorrow morning hopefully.  Wife will let the patient know

## 2023-09-06 NOTE — Progress Notes (Signed)
 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 The CT is showing enlargement in the size of his left sided pleural effusion which is contributing to his symptoms of dyspnea. On CT, the size of effusion is noticeable and I will refer him to IR for an US  guided thoracentesis given he is on Eliquis  preventing us  from performing the thoracentesis today. He will follow up with Dr. Adrian [name] regarding his malignancy to further explore therapeutic options. Should his effusion continue to recur, an indwelling pleural catheter remains an option.  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    - IR THORACENTESIS ASP PLEURAL SPACE W/IMG GUIDE; Future - Continue ICS/LABA/LAMA therapy - Continue oxygen therapy with goal SpO2 of 88-92% - Will consider PleurX if effusion continues to recurr  Return in about 6 months (around 03/08/2024).  I spent 32 minutes caring for this patient today, including preparing to see the patient, obtaining a medical history , reviewing a separately obtained history, performing a medically appropriate examination and/or evaluation, counseling and educating the patient/family/caregiver, ordering medications, tests, or procedures, documenting clinical information in the electronic health record, and independently interpreting results (not separately reported/billed) and communicating results to the patient/family/caregiver  Vergia Glasgow, MD [facility]  End of visit  medications:  No orders of the defined types were placed in this encounter.    Current Outpatient Medications:    albuterol  (PROVENTIL  HFA;VENTOLIN  HFA) 108 (90 Base) MCG/ACT inhaler, Inhale 2 puffs into the lungs every 6 (six) hours as needed for wheezing or shortness of breath., Disp: 1 Inhaler, Rfl: 0   albuterol  (PROVENTIL ) (2.5 MG/3ML) 0.083% nebulizer solution, Take 2.5 mg by nebulization every 6 (six) hours as needed for wheezing or shortness of breath. (Patient not taking: Reported on 09/06/2023), Disp: , Rfl:    apixaban  (ELIQUIS ) 5 MG TABS tablet, Take 1 tablet (5 mg total) by mouth 2 (two) times daily., Disp: 60 tablet, Rfl: 3   carboxymethylcellulose (REFRESH PLUS) 0.5 % SOLN, 1 drop 4 (four) times daily as needed., Disp: , Rfl:    chlorpheniramine-HYDROcodone  (TUSSIONEX) 10-8 MG/5ML, Take 5 mLs by mouth every 12 (twelve) hours as needed for cough., Disp: 115 mL, Rfl: 0   finasteride (PROSCAR) 5 MG tablet, Take 5 mg by mouth daily., Disp: , Rfl:    fluticasone-salmeterol (ADVAIR) 250-50 MCG/ACT AEPB, Inhale 1 puff into the lungs in the morning and at bedtime., Disp: , Rfl:    glimepiride  (AMARYL ) 2 MG tablet, Take 2 mg by mouth daily with breakfast. (Patient not taking: Reported on 09/06/2023), Disp: , Rfl:    ipratropium-albuterol  (DUONEB) 0.5-2.5 (3) MG/3ML SOLN, Take 3 mLs by nebulization every 6 (six) hours as needed., Disp: 360 mL, Rfl: 3   levothyroxine  (SYNTHROID ) 200 MCG tablet, Take 200 mcg by mouth daily before breakfast., Disp: , Rfl:    lidocaine -prilocaine  (EMLA ) cream, Apply to affected area once, Disp: 30 g, Rfl: 3   Magnesium  Glycinate 120 MG CAPS, Take 400 mg by mouth., Disp: , Rfl:    ondansetron  (ZOFRAN ) 8 MG  tablet, Take 1 tablet (8 mg total) by mouth every 8 (eight) hours as needed for nausea, vomiting or refractory nausea / vomiting. Start on the third day after carboplatin ., Disp: 60 tablet, Rfl: 2   Oxycodone  HCl 10 MG TABS, Take 1 tablet (10 mg total) by mouth  every 6 (six) hours as needed. Okay to cut tablet in half if needed., Disp: 60 tablet, Rfl: 0   OXYGEN, Inhale 3 L into the lungs continuous., Disp: , Rfl:    potassium chloride  SA (KLOR-CON  M) 20 MEQ tablet, TAKE 1 TABLET BY MOUTH TWICE A DAY, Disp: 180 tablet, Rfl: 1   prochlorperazine  (COMPAZINE ) 10 MG tablet, Take 1 tablet (10 mg total) by mouth every 6 (six) hours as needed for nausea or vomiting., Disp: 60 tablet, Rfl: 2   SEMAGLUTIDE,0.25 OR 0.5MG /DOS, [place], Inject 0.5 mg into the skin once a week., Disp: , Rfl:    tamsulosin  (FLOMAX ) 0.4 MG CAPS capsule, 0.8 mg daily., Disp: , Rfl:    Tiotropium Bromide Monohydrate  (SPIRIVA  RESPIMAT) 2.5 MCG/ACT AERS, Inhale 2 puffs into the lungs daily., Disp: 1 each, Rfl: 11   torsemide  (DEMADEX ) 20 MG tablet, Take 1 tablet (20 mg total) by mouth daily., Disp: 30 tablet, Rfl: 0   vitamin B-12 (CYANOCOBALAMIN ) 1000 MCG tablet, Take 1,000 mcg by mouth daily., Disp: , Rfl:  No current facility-administered medications for this visit.  Facility-Administered Medications Ordered in Other Visits:    0.9 %  sodium chloride  infusion, , Intravenous, Continuous, Finnegan, Deadra Everts, MD, Last Rate: 10 mL/hr at 04/05/23 1305, New Bag at 04/05/23 1305   Subjective:   PATIENT ID: Levi [name] GENDER: male DOB: [birth year], MRN: [id number]  Chief Complaint  Patient presents with   Follow-up    Increased SOB in the last 2 weeks. Wheezing. Cough with grey sputum.     HPI  Levi [name] is a pleasant 83 year old male presenting for follow up.  Patient presents today in the company of his wife. He is reporting increased shortness of breath at rest and with exertion. He is generally feeling more tired. His oxygen levels have been dropping and he's had to use his oxygen more recently, after having come off of it recently. He is followed closely by Dr. Adrian [name] from oncology and has had surveillance imaging recently ordered by Dr. Adrian [name].  I was following Levi [name]  for shortness of breath and respiratory failure. He carries a history of lung cancer treated with chemoradiation in the past. Surveillance imaging was noted for new pleural based mass and lymphadenopathy, biopsied via CT guided biopsy and showed a different primary tumor (small cell carcinoma). He has not required a thoracentesis since we last met. He has been undergoing chemotherapy and has tolerated it well.   We had discussed placement of an IPC during his last visit and deferred at the time unless his effusion continued to re-accumulate with increased frequency. He is maintained on triple therapy with LAMA/LABA/ICS and is currently on 2L of Oxygen via nasal cannula.   Patient underwent restaging CT in October 2024 which returned with findings that are highly concerning for recurrence of his malignancy.  His chest CT is showed a left-sided pleural effusion, pleural nodularity throughout the left hemithorax, a large bulky left hilar mass, enlarged mediastinal and hilar lymph nodes, and the new cavitary nodule in the right pulmonary apex.  Biopsy had shown small cell lung cancer and he was started on carboplatinum, etoposide , and atezolizumab  under the care  of Dr. Adrian [name]. The most recent staging CT from 09/04/2023 showed lesions in the liver in addition to marked interval enlargement of the mediastinal and left hilar lymph nodes in addition to new pleural nodularity in the left lower lobe. There is also increase in the size of the left pleural effusion.   He quit smoking around the time of his diagnosis with his initial lung cancer. At that time, he underwent navigational bronchoscopy with biopsies. He underwent radiation therapy to the LUL mass. He has a history of Afib for which he is followed by cardiology, he is on Eliquis .   He did smoke for a long time, with at least 50 to 60 pack years on him. 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 He is currently retired.   PFT's performed 04/22/2022 showed FEV1/FVC at 59% and FEV1 at 69% predicted, post bronchodilator FEV1 was 84% predicted.    Ancillary information including prior medications, full medical/surgical/family/social histories, and PFTs (when available) are listed below and have been reviewed.   Review of Systems  Constitutional:  Negative for chills and fever.  HENT:  Positive for hearing loss. Negative for ear pain and tinnitus.   Respiratory:  Positive for shortness of breath. Negative for cough, hemoptysis, sputum production and wheezing.   Cardiovascular:  Negative for chest pain.     Objective:   Vitals:   09/06/23 1311  BP: 132/84  Pulse: 74  Temp: 97.7 F (36.5 C)  SpO2: 94%  Weight: 250 lb 12.8 oz (113.8 kg)  Height: 6' (1.829 m)   94% on 3 LPM BMI Readings from Last 3 Encounters:  09/06/23 34.01 kg/m  08/28/23 34.04 kg/m  08/07/23 34.04 kg/m   Wt Readings from Last 3 Encounters:  09/06/23 250 lb 12.8 oz (113.8 kg)  08/28/23 251 lb (113.9 kg)  08/07/23 251 lb (113.9 kg)    Physical Exam Constitutional:      Appearance: He is obese.  HENT:     Head: Normocephalic.     Nose: Nose normal.     Mouth/Throat:     Mouth: Mucous membranes are moist.  Eyes:     Extraocular Movements: Extraocular movements intact.  Cardiovascular:     Rate and Rhythm: Normal rate. Rhythm irregular.     Heart sounds: Normal heart sounds.  Pulmonary:     Effort: Pulmonary effort is normal.     Breath sounds: Rales (left lung base) present. No wheezing.  Neurological:     General: No focal deficit present.     Mental Status: He is alert and oriented to person, place, and time. Mental status is at baseline.       Ancillary Information    Past Medical History:  Diagnosis Date   Asthma    COPD (chronic obstructive pulmonary disease) (HCC)    Hearing  loss    Hypertension    Hypothyroidism    Mass of left lung    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HCC)    Prostate enlargement    Shortness of breath    Squamous cell carcinoma of lung, left (HCC)    Thyroid  disease    Tobacco abuse      Family History  Problem Relation Age of Onset   Aneurysm Mother    Cancer Father      Past Surgical History:  Procedure Laterality Date   CARDIOVERSION N/A    CARDIOVERSION N/A 11/24/2021   Procedure: CARDIOVERSION;  Surgeon: Sammy Crisp, MD;  Location: [facility];  Service: Cardiovascular;  Laterality: N/A;   ELECTROMAGNETIC NAVIGATION BROCHOSCOPY Left 10/19/2018   Procedure: ELECTROMAGNETIC NAVIGATION BRONCHOSCOPY LEFT;  Surgeon: Marc Senior, MD;  Location: [facility];  Service: Cardiopulmonary;  Laterality: Left;   ENDOBRONCHIAL ULTRASOUND Left 10/19/2018   Procedure: ENDOBRONCHIAL ULTRASOUND LEFT;  Surgeon: Marc Senior, MD;  Location: [facility];  Service: Cardiopulmonary;  Laterality: Left;   IR IMAGING GUIDED PORT INSERTION  03/03/2023   IR THORACENTESIS ASP PLEURAL SPACE W/IMG GUIDE  03/01/2023   MELANOMA EXCISION Left    NO PAST SURGERIES      Social History   Socioeconomic History   Marital status: Married    Spouse name: Thersia Flax   Number of children: 5   Years of education: Not on file   Highest education level: Not on file  Occupational History   Not on file  Tobacco Use   Smoking status: Former    Current packs/day: 0.00    Average packs/day: 0.3 packs/day for 62.0 years (15.5 ttl pk-yrs)    Types: Cigarettes    Start date: 09/04/1956    Quit date: 09/05/2018    Years since quitting: 5.0   Smokeless tobacco: Never  Vaping Use   Vaping status: Never Used  Substance and Sexual Activity   Alcohol use: No    Alcohol/week: 0.0 standard drinks of alcohol   Drug use: No   Sexual activity: Not on file  Other Topics Concern   Not on file  Social History Narrative   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 He is married to his wife of 30+ years, Thersia Flax and has 5 children (1 deceased), many grand children, and one great grand child.    Social Drivers of Corporate investment banker Strain: Low Risk  (12/04/2018)   Overall Financial Resource Strain (CARDIA)    Difficulty of Paying Living Expenses: Not very hard  Food Insecurity: No Food Insecurity (12/04/2018)   Hunger Vital Sign    Worried About Running Out of Food in the Last Year: Never true    Ran Out of Food in the Last Year: Never true  Transportation Needs: No Transportation Needs (12/04/2018)   PRAPARE - Administrator, Civil Service (Medical): No    Lack of Transportation (Non-Medical): No  Physical Activity: Not on file  Stress: Stress Concern Present (12/04/2018)   [employer] of Occupational Health - Occupational Stress Questionnaire    Feeling of Stress : Very much  Social Connections: Unknown (12/04/2018)   Social Connection and Isolation Panel [NHANES]    Frequency of Communication with Friends and Family: More than three times a week    Frequency of Social Gatherings with Friends and Family: Once a week    Attends Religious Services: Not on file    Active Member of Clubs or Organizations: Not on file    Attends Banker Meetings: Not on file    Marital Status: Not on file  Intimate Partner Violence: Not on file     No Known Allergies   CBC    Component Value Date/Time   WBC 6.1 08/28/2023 0835   WBC 6.4 02/27/2023 0838   RBC 4.40  08/28/2023 0835   HGB 13.2 08/28/2023 0835   HCT 41.0 08/28/2023 0835   PLT 167 08/28/2023 0835   MCV 93.2 08/28/2023 0835   MCV 90.0 10/20/2015 1020   MCH 30.0 08/28/2023 0835   MCHC 32.2 08/28/2023 0835   RDW 13.8 08/28/2023 0835   LYMPHSABS 1.2 08/28/2023 0835   MONOABS 0.8 08/28/2023 0835   EOSABS 0.2  08/28/2023 0835   BASOSABS 0.0 08/28/2023 0835    Pulmonary Functions Testing Results:    Latest Ref Rng & Units 04/22/2022   12:02 PM  PFT Results  FVC-Pre L 3.75   FVC-Predicted Pre % 85   FVC-Post L 4.15   FVC-Predicted Post % 93   Pre FEV1/FVC % % 59   Post FEV1/FCV % % 64   FEV1-Pre L 2.20   FEV1-Predicted Pre % 69   FEV1-Post L 2.66   DLCO uncorrected ml/min/mmHg 11.02   DLCO UNC% % 42   DLVA Predicted % 47   TLC L 6.33   TLC % Predicted % 84   RV % Predicted % 78     Outpatient Medications Prior to Visit  Medication Sig Dispense Refill   albuterol  (PROVENTIL  HFA;VENTOLIN  HFA) 108 (90 Base) MCG/ACT inhaler Inhale 2 puffs into the lungs every 6 (six) hours as needed for wheezing or shortness of breath. 1 Inhaler 0   albuterol  (PROVENTIL ) (2.5 MG/3ML) 0.083% nebulizer solution Take 2.5 mg by nebulization every 6 (six) hours as needed for wheezing or shortness of breath. (Patient not taking: Reported on 09/06/2023)     apixaban  (ELIQUIS ) 5 MG TABS tablet Take 1 tablet (5 mg total) by mouth 2 (two) times daily. 60 tablet 3   carboxymethylcellulose (REFRESH PLUS) 0.5 % SOLN 1 drop 4 (four) times daily as needed.     chlorpheniramine-HYDROcodone  (TUSSIONEX) 10-8 MG/5ML Take 5 mLs by mouth every 12 (twelve) hours as needed for cough. 115 mL 0   finasteride (PROSCAR) 5 MG tablet Take 5 mg by mouth daily.     fluticasone-salmeterol (ADVAIR) 250-50 MCG/ACT AEPB Inhale 1 puff into the lungs in the morning and at bedtime.     glimepiride  (AMARYL ) 2 MG tablet Take 2 mg by mouth daily with breakfast. (Patient not taking: Reported on 09/06/2023)     ipratropium-albuterol  (DUONEB) 0.5-2.5 (3) MG/3ML SOLN Take 3 mLs by nebulization every 6 (six) hours as needed. 360 mL 3   levothyroxine  (SYNTHROID ) 200 MCG tablet Take 200 mcg by mouth daily before breakfast.     lidocaine -prilocaine  (EMLA ) cream Apply to affected area once 30 g 3   Magnesium  Glycinate 120 MG CAPS Take 400 mg by mouth.      ondansetron  (ZOFRAN ) 8 MG tablet Take 1 tablet (8 mg total) by mouth every 8 (eight) hours as needed for nausea, vomiting or refractory nausea / vomiting. Start on the third day after carboplatin . 60 tablet 2   Oxycodone  HCl 10 MG TABS Take 1 tablet (10 mg total) by mouth every 6 (six) hours as needed. Okay to cut tablet in half if needed. 60 tablet 0   OXYGEN Inhale 3 L into the lungs continuous.     potassium chloride  SA (KLOR-CON  M) 20 MEQ tablet TAKE 1 TABLET BY MOUTH TWICE A DAY 180 tablet 1   prochlorperazine  (COMPAZINE ) 10 MG tablet Take 1 tablet (10 mg total) by mouth every 6 (six) hours as needed for nausea or vomiting. 60 tablet 2   SEMAGLUTIDE,0.25 OR 0.5MG /DOS, [place] Inject 0.5 mg into the skin once  a week.     tamsulosin  (FLOMAX ) 0.4 MG CAPS capsule 0.8 mg daily.     Tiotropium Bromide Monohydrate  (SPIRIVA  RESPIMAT) 2.5 MCG/ACT AERS Inhale 2 puffs into the lungs daily. 1 each 11   torsemide  (DEMADEX ) 20 MG tablet Take 1 tablet (20 mg total) by mouth daily. 30 tablet 0   vitamin B-12 (CYANOCOBALAMIN ) 1000 MCG tablet Take 1,000 mcg by mouth daily.     Facility-Administered Medications Prior to Visit  Medication Dose Route Frequency Provider Last Rate Last Admin   0.9 %  sodium chloride  infusion   Intravenous Continuous Finnegan, Timothy J, MD 10 mL/hr at 04/05/23 1305 New Bag at 04/05/23 1305

## 2023-09-07 ENCOUNTER — Ambulatory Visit: Payer: Medicare Other | Admitting: Student in an Organized Health Care Education/Training Program

## 2023-09-08 ENCOUNTER — Ambulatory Visit
Admission: RE | Admit: 2023-09-08 | Discharge: 2023-09-08 | Disposition: A | Discharge status: Home / Self Care | Source: Ambulatory Visit | Attending: Student in an Organized Health Care Education/Training Program | Admitting: Student in an Organized Health Care Education/Training Program

## 2023-09-08 ENCOUNTER — Encounter: Payer: Self-pay | Admitting: Oncology

## 2023-09-08 ENCOUNTER — Ambulatory Visit
Admission: RE | Admit: 2023-09-08 | Discharge: 2023-09-08 | Disposition: A | Discharge status: Home / Self Care | Source: Ambulatory Visit | Attending: Physician Assistant | Admitting: Physician Assistant

## 2023-09-08 ENCOUNTER — Other Ambulatory Visit: Payer: Self-pay | Admitting: Physician Assistant

## 2023-09-08 ENCOUNTER — Inpatient Hospital Stay: Attending: Oncology | Admitting: Oncology

## 2023-09-08 VITALS — BP 135/58 | HR 52 | Temp 97.9°F | Resp 16 | Wt 249.0 lb

## 2023-09-08 DIAGNOSIS — C3492 Malignant neoplasm of unspecified part of left bronchus or lung: Secondary | ICD-10-CM | POA: Insufficient documentation

## 2023-09-08 DIAGNOSIS — R5383 Other fatigue: Secondary | ICD-10-CM | POA: Diagnosis not present

## 2023-09-08 DIAGNOSIS — R0602 Shortness of breath: Secondary | ICD-10-CM | POA: Insufficient documentation

## 2023-09-08 DIAGNOSIS — N289 Disorder of kidney and ureter, unspecified: Secondary | ICD-10-CM | POA: Diagnosis not present

## 2023-09-08 DIAGNOSIS — J910 Malignant pleural effusion: Secondary | ICD-10-CM

## 2023-09-08 DIAGNOSIS — R42 Dizziness and giddiness: Secondary | ICD-10-CM | POA: Diagnosis not present

## 2023-09-08 DIAGNOSIS — Z79630 Long term (current) use of alkylating agent: Secondary | ICD-10-CM | POA: Diagnosis not present

## 2023-09-08 DIAGNOSIS — Z85820 Personal history of malignant melanoma of skin: Secondary | ICD-10-CM | POA: Diagnosis not present

## 2023-09-08 DIAGNOSIS — Z5111 Encounter for antineoplastic chemotherapy: Secondary | ICD-10-CM | POA: Insufficient documentation

## 2023-09-08 DIAGNOSIS — J90 Pleural effusion, not elsewhere classified: Secondary | ICD-10-CM | POA: Diagnosis not present

## 2023-09-08 DIAGNOSIS — C3412 Malignant neoplasm of upper lobe, left bronchus or lung: Secondary | ICD-10-CM | POA: Insufficient documentation

## 2023-09-08 DIAGNOSIS — F419 Anxiety disorder, unspecified: Secondary | ICD-10-CM | POA: Diagnosis not present

## 2023-09-08 DIAGNOSIS — F32A Depression, unspecified: Secondary | ICD-10-CM | POA: Insufficient documentation

## 2023-09-08 DIAGNOSIS — Z87891 Personal history of nicotine dependence: Secondary | ICD-10-CM | POA: Diagnosis not present

## 2023-09-08 DIAGNOSIS — R059 Cough, unspecified: Secondary | ICD-10-CM | POA: Diagnosis not present

## 2023-09-08 DIAGNOSIS — E871 Hypo-osmolality and hyponatremia: Secondary | ICD-10-CM | POA: Insufficient documentation

## 2023-09-08 DIAGNOSIS — R609 Edema, unspecified: Secondary | ICD-10-CM | POA: Diagnosis not present

## 2023-09-08 MED ORDER — LIDOCAINE HCL (PF) 1 % IJ SOLN
10.0000 mL | Freq: Once | INTRAMUSCULAR | Status: AC
  Administered 2023-09-08: 10 mL

## 2023-09-08 NOTE — Progress Notes (Unsigned)
 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  INTERVAL HISTORY: Patient returns to clinic today for further evaluation and continuation of maintenance Tecentriq .  He had 1 episode 1 to 2 weeks ago where the room was spinning he felt nauseous and had to sit down.  It resolved on its own and has not recurred.  He otherwise feels well.  He continues to have chronic shortness of breath requiring oxygen supplementation.  He does not complain of weakness or fatigue today.  He is tolerating Tecentriq  without significant side effects.  He has no neurologic complaints.  He denies any chest pain, cough, or hemoptysis.  He has a good appetite and denies weight loss.  He denies any nausea, vomiting, constipation, or diarrhea.  He has no urinary complaints.  Patient offers no further specific complaints today.  REVIEW OF SYSTEMS:   Review of Systems  Constitutional: Negative.  Negative for fever, malaise/fatigue and weight loss.  HENT: Negative.  Negative for congestion.   Respiratory:  Positive for shortness of breath. Negative for cough and hemoptysis.   Cardiovascular: Negative.  Negative for chest pain and leg swelling.  Gastrointestinal: Negative.  Negative for abdominal pain and nausea.  Genitourinary: Negative.  Negative for dysuria.  Musculoskeletal: Negative.  Negative for back pain.  Skin:  Negative for rash.  Neurological: Negative.  Negative for dizziness, focal weakness,  weakness and headaches.  Endo/Heme/Allergies:  Does not bruise/bleed easily.  Psychiatric/Behavioral: Negative.  Negative for depression. The patient is not nervous/anxious.     As per HPI. Otherwise, a complete review of systems is negative.  PAST MEDICAL HISTORY: Past Medical History:  Diagnosis Date  . Asthma   . COPD (chronic obstructive pulmonary disease) (HCC)   . Hearing loss   . Hypertension   . Hypothyroidism   . Mass of left lung   . 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HCC)   . Prostate enlargement   . Shortness of breath   . Squamous cell carcinoma of lung, left (HCC)   . Thyroid  disease   . Tobacco abuse     PAST SURGICAL HISTORY: Past Surgical History:  Procedure Laterality Date  . CARDIOVERSION N/A   . CARDIOVERSION N/A 11/24/2021   Procedure: CARDIOVERSION;  Surgeon: Sammy Crisp, MD;  Location: [facility] ORS;  Service: Cardiovascular;  Laterality: N/A;  . ELECTROMAGNETIC NAVIGATION BROCHOSCOPY Left 10/19/2018   Procedure: ELECTROMAGNETIC NAVIGATION BRONCHOSCOPY LEFT;  Surgeon: Marc Senior, MD;  Location: [facility] ORS;  Service: Cardiopulmonary;  Laterality: Left;  . ENDOBRONCHIAL ULTRASOUND Left 10/19/2018   Procedure: ENDOBRONCHIAL ULTRASOUND LEFT;  Surgeon: Marc Senior, MD;  Location: [facility] ORS;  Service: Cardiopulmonary;  Laterality: Left;  . IR IMAGING GUIDED PORT INSERTION  03/03/2023  . IR THORACENTESIS ASP PLEURAL SPACE W/IMG GUIDE  03/01/2023  . MELANOMA EXCISION Left   . NO PAST SURGERIES      FAMILY HISTORY: Family History  Problem Relation Age of Onset  . Aneurysm Mother   . Cancer Father     ADVANCED DIRECTIVES (Y/N):  N  HEALTH MAINTENANCE: Social  History   Tobacco Use  . Smoking status: Former    Current packs/day: 0.00    Average packs/day: 0.3 packs/day for 62.0 years (15.5 ttl pk-yrs)    Types: Cigarettes    Start date: 09/04/1956    Quit date: 09/05/2018    Years since quitting: 5.0  . Smokeless tobacco: Never  Vaping Use  .  Vaping status: Never Used  Substance Use Topics  . Alcohol use: No    Alcohol/week: 0.0 standard drinks of alcohol  . Drug use: No     Colonoscopy:  PAP:  Bone density:  Lipid panel:  No Known Allergies  Current Outpatient Medications  Medication Sig Dispense Refill  . albuterol  (PROVENTIL  HFA;VENTOLIN  HFA) 108 (90 Base) MCG/ACT inhaler Inhale 2 puffs into the lungs every 6 (six) hours as needed for wheezing or shortness of breath. 1 Inhaler 0  . apixaban  (ELIQUIS ) 5 MG TABS tablet Take 1 tablet (5 mg total) by mouth 2 (two) times daily. 60 tablet 3  . carboxymethylcellulose (REFRESH PLUS) 0.5 % SOLN 1 drop 4 (four) times daily as needed.    . chlorpheniramine-HYDROcodone  (TUSSIONEX) 10-8 MG/5ML Take 5 mLs by mouth every 12 (twelve) hours as needed for cough. 115 mL 0  . finasteride (PROSCAR) 5 MG tablet Take 5 mg by mouth daily.    . fluticasone-salmeterol (ADVAIR) 250-50 MCG/ACT AEPB Inhale 1 puff into the lungs in the morning and at bedtime.    Aaron Aas ipratropium-albuterol  (DUONEB) 0.5-2.5 (3) MG/3ML SOLN Take 3 mLs by nebulization every 6 (six) hours as needed. 360 mL 3  . levothyroxine  (SYNTHROID ) 200 MCG tablet Take 200 mcg by mouth daily before breakfast.    . Magnesium  Glycinate 120 MG CAPS Take 400 mg by mouth.    . Oxycodone  HCl 10 MG TABS Take 1 tablet (10 mg total) by mouth every 6 (six) hours as needed. Okay to cut tablet in half if needed. 60 tablet 0  . OXYGEN Inhale 3 L into the lungs continuous.    . potassium chloride  SA (KLOR-CON  M) 20 MEQ tablet TAKE 1 TABLET BY MOUTH TWICE A DAY 180 tablet 1  . SEMAGLUTIDE,0.25 OR 0.5MG /DOS, [place] Inject 0.5 mg into the skin once a week.    . tamsulosin  (FLOMAX ) 0.4 MG CAPS capsule 0.8 mg daily.    . Tiotropium Bromide Monohydrate  (SPIRIVA  RESPIMAT) 2.5 MCG/ACT AERS Inhale 2 puffs into the lungs daily. 1 each 11  . torsemide  (DEMADEX ) 20 MG tablet Take 1 tablet (20 mg total) by mouth daily. 30 tablet 0  . vitamin B-12 (CYANOCOBALAMIN )  1000 MCG tablet Take 1,000 mcg by mouth daily.    . albuterol  (PROVENTIL ) (2.5 MG/3ML) 0.083% nebulizer solution Take 2.5 mg by nebulization every 6 (six) hours as needed for wheezing or shortness of breath. (Patient not taking: Reported on 07/21/2023)    . glimepiride  (AMARYL ) 2 MG tablet Take 2 mg by mouth daily with breakfast. (Patient not taking: Reported on 07/21/2023)     No current facility-administered medications for this visit.    OBJECTIVE: Vitals:   09/08/23 1031  BP: (!) 135/58  Pulse: (!) 52  Resp: 16  Temp: 97.9 F (36.6 C)  SpO2: 96%      Body mass index is 33.77 kg/m.    ECOG FS:1 - Symptomatic but completely ambulatory  General: Well-developed, well-nourished, no acute distress. Eyes: Pink conjunctiva, anicteric sclera. HEENT: Normocephalic, moist mucous membranes. Lungs: No audible wheezing or coughing. Heart: Regular rate and rhythm. Abdomen: Soft, nontender, no  obvious distention. Musculoskeletal: No edema, cyanosis, or clubbing. Neuro: Alert, answering all questions appropriately. Cranial nerves grossly intact. Skin: No rashes or petechiae noted. Psych: Normal affect.  LAB RESULTS:  Lab Results  Component Value Date   NA 132 (L) 08/28/2023   K 3.9 08/28/2023   CL 102 08/28/2023   CO2 21 (L) 08/28/2023   GLUCOSE 116 (H) 08/28/2023   BUN 16 08/28/2023   CREATININE 1.25 (H) 08/28/2023   CALCIUM 8.8 (L) 08/28/2023   PROT 6.6 08/28/2023   ALBUMIN 3.2 (L) 08/28/2023   AST 21 08/28/2023   ALT 12 08/28/2023   ALKPHOS 83 08/28/2023   BILITOT 0.5 08/28/2023   GFRNONAA 58 (L) 08/28/2023   GFRAA 58 (L) 07/18/2019    Lab Results  Component Value Date   WBC 6.1 08/28/2023   NEUTROABS 3.9 08/28/2023   HGB 13.2 08/28/2023   HCT 41.0 08/28/2023   MCV 93.2 08/28/2023   PLT 167 08/28/2023     STUDIES: CT CHEST ABDOMEN PELVIS W CONTRAST Result Date: 09/04/2023 CLINICAL DATA:  Follow-up small cell lung cancer. Assess treatment response. * Tracking Code:  BO * EXAM: CT CHEST, ABDOMEN, AND PELVIS WITH CONTRAST TECHNIQUE: Multidetector CT imaging of the chest, abdomen and pelvis was performed following the standard protocol during bolus administration of intravenous contrast. RADIATION DOSE REDUCTION: This exam was performed according to the departmental dose-optimization program which includes automated exposure control, adjustment of the mA and/or kV according to patient size and/or use of iterative reconstruction technique. CONTRAST:  ISOVUE -370 IOPAMIDOL  (ISOVUE -370) INJECTION 76% COMPARISON:  FDG PET scan 05/29/2023 FINDINGS: CT CHEST FINDINGS Cardiovascular: No significant vascular findings. Normal heart size. No pericardial effusion. Mediastinum/Nodes: Interval enlargement of mediastinal lymph nodes. Subcarinal lymph node measures 3.4 cm short axis compared to 1.1 cm nodule on comparison FDG PET scan. Marked enlargement of RIGHT lower paratracheal lymph node measuring 3.6 cm increased from 1 cm. Large posterior LEFT hilar lymph node measuring 3.1 cm is new from prior. Lungs/Pleura: Interval increase in LEFT pleural effusion. Along the pleural surface of the LEFT lower lobe there is new nodularity. For example LEFT lobe pleural base nodule measuring 2.5 cm on image 44. Medial LEFT lobe pleural nodule measuring 1.4 cm image 44. Peribronchial thickening in the LEFT upper lobe related to radiation change. Increase in parenchymal nodule in the superior aspect of the LEFT lower lobe measuring 2.1 cm on image 43/3 increased from 1.4 cm. Extensive centrilobular emphysema the upper lobes. No RIGHT lung nodularity Musculoskeletal: No aggressive osseous lesion. CT ABDOMEN AND PELVIS FINDINGS Hepatobiliary: New hypoenhancing lesions in LEFT and RIGHT hepatic lobe. Example central LEFT hepatic lobe lesion measuring 3.8 cm on image 64. Lateral RIGHT hepatic lobe lesion measuring 5.4 cm on image 63. Pancreas: Pancreas is normal. No ductal dilatation. No pancreatic  inflammation. Spleen: Normal spleen Adrenals/urinary tract: Adrenal glands and kidneys are normal. The ureters and bladder normal. Stomach/Bowel: Stomach, small bowel, appendix, and cecum are normal. The colon and rectosigmoid colon are normal. Vascular/Lymphatic: Abdominal aorta is normal caliber. There is no retroperitoneal or periportal lymphadenopathy. No pelvic lymphadenopathy. Reproductive: Unremarkable Other: No free fluid. Musculoskeletal: No aggressive osseous lesion. IMPRESSION: CHEST: 1. Marked interval enlargement of mediastinal and LEFT hilar lymph nodes consistent with progression of nodal metastasis. 2. New pleural nodularity in the LEFT lower lobe consistent with pleural metastasis. 3. Increase in size of parenchymal nodule in the LEFT lower lobe consistent with local lung cancer recurrence. 4. Increase in size of LEFT pleural effusion. PELVIS:  1. New multifocal hepatic metastasis. 2. Normal adrenal glands. 3. No abdominal adenopathy. 4. No evidence skeletal metastasis. Electronically Signed   By: Deboraha Fallow M.D.   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  PET scan results from February 14, 2023 reviewed independently and reported as above confirming stage of disease although patient does not have metastatic disease outside his chest.  MRI of the brain on February 14, 2023 was negative for metastatic disease.  Patient completed cycle 4 of carboplatinum, etoposide , and Tecentriq  on May 19, 2023.  Repeat PET scan on June 07, 2023 reviewed independently with significant improvement of disease burden.  Will continue maintenance Tecentriq  every 3 weeks for up to 2 years or until progression of disease.  Patient initiated maintenance treatment on June 05, 2023.  Proceed with cycle 9 of treatment today.  Return to clinic in 3 weeks for  further evaluation and consideration of cycle 10.  Will repeat imaging with brain MRI and CT scans prior to next clinic visit.   Clinical stage IIB squamous cell carcinoma, left upper lobe lung:  Patient underwent biopsy with navigational bronchoscopy on October 19, 2018 confirming diagnosis.  Patient declined surgery and wished to pursue XRT along with concurrent chemotherapy.  Given his stage of disease he did not receive maintenance immunotherapy.  He completed cycle 7 of weekly carboplatinum and Taxol  on January 09, 2019 and then completed XRT on January 14, 2019.   History of melanoma: Unclear stage or depth, although by report was in situ.  Patient had Mohs surgery in fall 2019.  Previous lung biopsy consistent with squamous cell carcinoma. Anxiety/depression: Improved.  Continue current medications as prescribed.  Continue follow-up with primary care physician as well as psychiatry at the [place]. Shortness of breath/cough: Chronic and unchanged.  Patient's last thoracic ultrasound did not reveal any significant fluid.  Continue supplemental oxygen as needed. Pain: Does not complain of this today.  Patient was given a refill of his oxycodone . Peripheral edema: Chronic and unchanged.  Patient has been instructed to increase his torsemide  to daily if his blood pressure remains above 110 systolic. Hypokalemia: Resolved. Hypomagnesia: Resolved.  Continue OTC oral supplementation. Hyponatremia: Mild, monitor. Renal insufficiency: Creatinine mildly improved to 1.25. Dizziness: Repeat MRI of the brain as above.   Patient expressed understanding and was in agreement with this plan. He also understands that He can call clinic at any time with any questions, concerns, or complaints.    Cancer Staging  Small cell lung cancer, left [facility]) Staging form: Lung, AJCC 8th Edition - Clinical stage from 02/24/2023: Stage IVA (cT4, cN2, cM1a) - Signed by Shellie Dials, MD on 02/24/2023  Squamous cell  carcinoma lung, left (HCC) Staging form: Lung, AJCC 8th Edition - Clinical stage from 10/27/2018: Stage IIB (cT1c, cN1, cM0) - Signed by Shellie Dials, MD on 11/16/2018   Shellie Dials, MD   09/08/2023 10:50 AM

## 2023-09-08 NOTE — Progress Notes (Signed)
START ON PATHWAY REGIMEN - Small Cell Lung     A cycle is every 21 days:     Lurbinectedin   **Always confirm dose/schedule in your pharmacy ordering system**  Patient Characteristics: Relapsed or Progressive Disease, Second Line, Relapse ? 6 Months Therapeutic Status: Relapsed or Progressive Disease Line of Therapy: Second Line Time to Relapse: Relapse ? 6 Months Intent of Therapy: Non-Curative / Palliative Intent, Discussed with Patient

## 2023-09-08 NOTE — Progress Notes (Signed)
 DISCONTINUE ON PATHWAY REGIMEN - Small Cell Lung     Cycles 1 through 4, every 21 days:     Atezolizumab       Carboplatin       Etoposide     Cycles 5 and beyond, every 21 days:     Atezolizumab    **Always confirm dose/schedule in your pharmacy ordering system**  PRIOR TREATMENT: [id number]: Atezolizumab  1,200 mg D1 + Carboplatin  AUC=5 D1 + Etoposide  100 mg/m2 D1-3 q21 Days x 4 Cycles, Followed by Atezolizumab  1,200 mg q21 Days Until Progression or Unacceptable Toxicity    Patient Characteristics: Relapsed or Progressive Disease, Second Line Therapeutic Status: Relapsed or Progressive Disease Line of Therapy: Second [employer]

## 2023-09-08 NOTE — Procedures (Signed)
 PROCEDURE SUMMARY:  Successful image-guided left thoracentesis. Yielded 1.4 liters of serosanguineous fluid. Patient tolerated procedure well. EBL < 1 mL No immediate complications.  Specimen was not sent for labs. Post procedure CXR shows no pneumothorax.  Please see imaging section of Epic for full dictation.  Marilu Shown PA-C 09/08/2023 1:54 PM

## 2023-09-08 NOTE — Progress Notes (Signed)
 Pharmacist Chemotherapy Monitoring - Initial Assessment    Anticipated start date: 09/18/23   The following has been reviewed per standard work regarding the patient's treatment regimen: The patient's diagnosis, treatment plan and drug doses, and organ/hematologic function Lab orders and baseline tests specific to treatment regimen  The treatment plan start date, drug sequencing, and pre-medications Prior authorization status  Patient's documented medication list, including drug-drug interaction screen and prescriptions for anti-emetics and supportive care specific to the treatment regimen The drug concentrations, fluid compatibility, administration routes, and timing of the medications to be used The patient's access for treatment and lifetime cumulative dose history, if applicable  The patient's medication allergies and previous infusion related reactions, if applicable   Changes made to treatment plan:  N/A  Follow up needed:  Pending authorization for treatment    Levi [name], PharmD, BCPS Clinical Pharmacist   09/08/2023  12:05 PM

## 2023-09-09 ENCOUNTER — Encounter: Payer: Self-pay | Admitting: Oncology

## 2023-09-11 ENCOUNTER — Ambulatory Visit

## 2023-09-11 ENCOUNTER — Encounter: Payer: Self-pay | Admitting: Oncology

## 2023-09-12 ENCOUNTER — Encounter: Payer: Self-pay | Admitting: Oncology

## 2023-09-13 ENCOUNTER — Ambulatory Visit
Admission: RE | Admit: 2023-09-13 | Discharge: 2023-09-13 | Disposition: A | Discharge status: Home / Self Care | Source: Ambulatory Visit | Attending: Oncology | Admitting: Oncology

## 2023-09-13 ENCOUNTER — Telehealth: Payer: Medicare Other | Admitting: Hospice and Palliative Medicine

## 2023-09-13 DIAGNOSIS — C3492 Malignant neoplasm of unspecified part of left bronchus or lung: Secondary | ICD-10-CM

## 2023-09-13 MED ORDER — GADOPICLENOL 0.5 MMOL/ML IV SOLN
10.0000 mL | Freq: Once | INTRAVENOUS | Status: AC | PRN
  Administered 2023-09-13: 10 mL via INTRAVENOUS

## 2023-09-13 MED ORDER — HEPARIN SOD (PORK) LOCK FLUSH 100 UNIT/ML IV SOLN
500.0000 [IU] | Freq: Once | INTRAVENOUS | Status: AC
  Administered 2023-09-13: 500 [IU] via INTRAVENOUS

## 2023-09-13 MED ORDER — SODIUM CHLORIDE 0.9% FLUSH
10.0000 mL | INTRAVENOUS | Status: DC | PRN
  Administered 2023-09-13: 10 mL via INTRAVENOUS

## 2023-09-14 ENCOUNTER — Encounter: Payer: Self-pay | Admitting: Oncology

## 2023-09-18 ENCOUNTER — Ambulatory Visit

## 2023-09-18 ENCOUNTER — Inpatient Hospital Stay

## 2023-09-18 ENCOUNTER — Other Ambulatory Visit

## 2023-09-18 ENCOUNTER — Ambulatory Visit: Admitting: Oncology

## 2023-09-18 ENCOUNTER — Inpatient Hospital Stay: Admitting: Oncology

## 2023-09-18 ENCOUNTER — Encounter: Payer: Self-pay | Admitting: Oncology

## 2023-09-18 VITALS — BP 123/91 | HR 78 | Temp 97.8°F | Resp 14 | Wt 243.0 lb

## 2023-09-18 DIAGNOSIS — C3492 Malignant neoplasm of unspecified part of left bronchus or lung: Secondary | ICD-10-CM

## 2023-09-18 DIAGNOSIS — Z5111 Encounter for antineoplastic chemotherapy: Secondary | ICD-10-CM | POA: Diagnosis not present

## 2023-09-18 LAB — CMP (CANCER CENTER ONLY)
ALT: 11 U/L (ref 0–44)
AST: 20 U/L (ref 15–41)
Albumin: 3.3 g/dL — ABNORMAL LOW (ref 3.5–5.0)
Alkaline Phosphatase: 109 U/L (ref 38–126)
Anion gap: 10 (ref 5–15)
BUN: 20 mg/dL (ref 8–23)
CO2: 23 mmol/L (ref 22–32)
Calcium: 8.7 mg/dL — ABNORMAL LOW (ref 8.9–10.3)
Chloride: 100 mmol/L (ref 98–111)
Creatinine: 1.51 mg/dL — ABNORMAL HIGH (ref 0.61–1.24)
GFR, Estimated: 46 mL/min — ABNORMAL LOW (ref >60)
Glucose, Bld: 119 mg/dL — ABNORMAL HIGH (ref 70–99)
Potassium: 3.8 mmol/L (ref 3.5–5.1)
Sodium: 133 mmol/L — ABNORMAL LOW (ref 135–145)
Total Bilirubin: 0.5 mg/dL (ref 0.0–1.2)
Total Protein: 6.9 g/dL (ref 6.5–8.1)

## 2023-09-18 LAB — CBC WITH DIFFERENTIAL (CANCER CENTER ONLY)
Abs Immature Granulocytes: 0.04 10*3/uL (ref 0.00–0.07)
Basophils Absolute: 0.1 10*3/uL (ref 0.0–0.1)
Basophils Relative: 1 %
Eosinophils Absolute: 0.1 10*3/uL (ref 0.0–0.5)
Eosinophils Relative: 2 %
HCT: 41.2 % (ref 39.0–52.0)
Hemoglobin: 13.4 g/dL (ref 13.0–17.0)
Immature Granulocytes: 1 %
Lymphocytes Relative: 13 %
Lymphs Abs: 1 10*3/uL (ref 0.7–4.0)
MCH: 29.6 pg (ref 26.0–34.0)
MCHC: 32.5 g/dL (ref 30.0–36.0)
MCV: 91.2 fL (ref 80.0–100.0)
Monocytes Absolute: 0.8 10*3/uL (ref 0.1–1.0)
Monocytes Relative: 11 %
Neutro Abs: 5.3 10*3/uL (ref 1.7–7.7)
Neutrophils Relative %: 72 %
Platelet Count: 217 10*3/uL (ref 150–400)
RBC: 4.52 MIL/uL (ref 4.22–5.81)
RDW: 14.1 % (ref 11.5–15.5)
WBC Count: 7.3 10*3/uL (ref 4.0–10.5)
nRBC: 0 % (ref 0.0–0.2)

## 2023-09-18 LAB — MAGNESIUM: Magnesium: 1.8 mg/dL (ref 1.7–2.4)

## 2023-09-18 LAB — CK: Total CK: 43 U/L — ABNORMAL LOW (ref 49–397)

## 2023-09-18 MED ORDER — HEPARIN SOD (PORK) LOCK FLUSH 100 UNIT/ML IV SOLN
500.0000 [IU] | Freq: Once | INTRAVENOUS | Status: AC | PRN
  Administered 2023-09-18: 500 [IU]
  Filled 2023-09-18: qty 5

## 2023-09-18 MED ORDER — PALONOSETRON HCL INJECTION 0.25 MG/5ML
0.2500 mg | Freq: Once | INTRAVENOUS | Status: AC
  Administered 2023-09-18: 0.25 mg via INTRAVENOUS
  Filled 2023-09-18: qty 5

## 2023-09-18 MED ORDER — SODIUM CHLORIDE 0.9 % IV SOLN
3.2000 mg/m2 | Freq: Once | INTRAVENOUS | Status: AC
  Administered 2023-09-18: 7.65 mg via INTRAVENOUS
  Filled 2023-09-18: qty 15.3

## 2023-09-18 MED ORDER — SODIUM CHLORIDE 0.9 % IV SOLN
INTRAVENOUS | Status: DC
  Filled 2023-09-18: qty 250

## 2023-09-18 MED ORDER — DEXAMETHASONE SODIUM PHOSPHATE 10 MG/ML IJ SOLN
10.0000 mg | Freq: Once | INTRAMUSCULAR | Status: AC
Start: 2023-09-18 — End: 2023-09-18
  Administered 2023-09-18: 10 mg via INTRAVENOUS
  Filled 2023-09-18: qty 1

## 2023-09-18 NOTE — Progress Notes (Signed)
 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  INTERVAL HISTORY: Patient returns to clinic today for further evaluation and consideration of cycle 1 of Lurbinectedin .  He continues to have weakness and fatigue, but denies any further falls.  He continues to have shortness of breath and cough, but this has mildly improved since receiving thoracentesis.  He has no neurologic complaints.  He denies any chest pain or hemoptysis.  He has a good appetite and denies weight loss.  He denies any nausea, vomiting, constipation, or diarrhea.  He has no urinary complaints.  Patient offers no further specific complaints today.  REVIEW OF SYSTEMS:   Review of Systems  Constitutional:  Positive for malaise/fatigue. Negative for fever and weight loss.  HENT: Negative.  Negative for congestion.   Respiratory:  Positive for cough and shortness of breath. Negative for hemoptysis.   Cardiovascular: Negative.  Negative for chest pain and leg swelling.  Gastrointestinal: Negative.  Negative for abdominal pain and nausea.  Genitourinary: Negative.  Negative for dysuria.  Musculoskeletal: Negative.  Negative for back pain.  Skin:  Negative for rash.  Neurological:  Positive for weakness. Negative for dizziness, focal weakness and headaches.  Endo/Heme/Allergies:  Does not bruise/bleed easily.  Psychiatric/Behavioral: Negative.  Negative for depression. The patient is not nervous/anxious.      As per HPI. Otherwise, a complete review of systems is negative.  PAST MEDICAL HISTORY: Past Medical History:  Diagnosis Date   Asthma    COPD (chronic obstructive pulmonary disease) (HCC)    Hearing loss    Hypertension    Hypothyroidism    Mass of left lung    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HCC)    Prostate enlargement    Shortness of breath    Squamous cell carcinoma of lung, left (HCC)    Thyroid  disease    Tobacco abuse     PAST SURGICAL HISTORY: Past Surgical History:  Procedure Laterality Date   CARDIOVERSION N/A    CARDIOVERSION N/A 11/24/2021   Procedure: CARDIOVERSION;  Surgeon: Sammy Crisp, MD;  Location: [facility] ORS;  Service: Cardiovascular;  Laterality: N/A;   ELECTROMAGNETIC NAVIGATION BROCHOSCOPY Left 10/19/2018   Procedure: ELECTROMAGNETIC NAVIGATION BRONCHOSCOPY LEFT;  Surgeon: Marc Senior, MD;  Location: [facility] ORS;  Service: Cardiopulmonary;  Laterality: Left;   ENDOBRONCHIAL ULTRASOUND Left 10/19/2018   Procedure: ENDOBRONCHIAL ULTRASOUND LEFT;  Surgeon: Marc Senior, MD;  Location: [facility] ORS;  Service: Cardiopulmonary;  Laterality: Left;   IR IMAGING GUIDED PORT INSERTION  03/03/2023   IR THORACENTESIS ASP PLEURAL SPACE W/IMG GUIDE  03/01/2023   MELANOMA EXCISION Left    NO PAST SURGERIES      FAMILY HISTORY: Family History  Problem Relation Age of Onset   Aneurysm Mother    Cancer Father     ADVANCED DIRECTIVES (Y/N):  N  HEALTH MAINTENANCE: Social History   Tobacco Use   Smoking status: Former    Current packs/day: 0.00    Average packs/day: 0.3 packs/day for 62.0 years (15.5 ttl pk-yrs)    Types: Cigarettes  Start date: 09/04/1956    Quit date: 09/05/2018    Years since quitting: 5.0   Smokeless tobacco: Never  Vaping Use   Vaping status: Never Used  Substance Use Topics   Alcohol use: No    Alcohol/week: 0.0 standard drinks of alcohol   Drug use: No     Colonoscopy:  PAP:  Bone density:  Lipid panel:  No Known  Allergies  Current Outpatient Medications  Medication Sig Dispense Refill   albuterol  (PROVENTIL  HFA;VENTOLIN  HFA) 108 (90 Base) MCG/ACT inhaler Inhale 2 puffs into the lungs every 6 (six) hours as needed for wheezing or shortness of breath. 1 Inhaler 0   albuterol  (PROVENTIL ) (2.5 MG/3ML) 0.083% nebulizer solution Take 2.5 mg by nebulization every 6 (six) hours as needed for wheezing or shortness of breath. (Patient not taking: Reported on 07/21/2023)     apixaban  (ELIQUIS ) 5 MG TABS tablet Take 1 tablet (5 mg total) by mouth 2 (two) times daily. 60 tablet 3   carboxymethylcellulose (REFRESH PLUS) 0.5 % SOLN 1 drop 4 (four) times daily as needed.     chlorpheniramine-HYDROcodone  (TUSSIONEX) 10-8 MG/5ML Take 5 mLs by mouth every 12 (twelve) hours as needed for cough. 115 mL 0   finasteride (PROSCAR) 5 MG tablet Take 5 mg by mouth daily.     fluticasone-salmeterol (ADVAIR) 250-50 MCG/ACT AEPB Inhale 1 puff into the lungs in the morning and at bedtime.     glimepiride  (AMARYL ) 2 MG tablet Take 2 mg by mouth daily with breakfast. (Patient not taking: Reported on 07/21/2023)     ipratropium-albuterol  (DUONEB) 0.5-2.5 (3) MG/3ML SOLN Take 3 mLs by nebulization every 6 (six) hours as needed. 360 mL 3   levothyroxine  (SYNTHROID ) 200 MCG tablet Take 200 mcg by mouth daily before breakfast.     Magnesium  Glycinate 120 MG CAPS Take 400 mg by mouth.     Oxycodone  HCl 10 MG TABS Take 1 tablet (10 mg total) by mouth every 6 (six) hours as needed. Okay to cut tablet in half if needed. 60 tablet 0   OXYGEN Inhale 3 L into the lungs continuous.     potassium chloride  SA (KLOR-CON  M) 20 MEQ tablet TAKE 1 TABLET BY MOUTH TWICE A DAY 180 tablet 1   SEMAGLUTIDE,0.25 OR 0.5MG /DOS, [place] Inject 0.5 mg into the skin once a week.     tamsulosin  (FLOMAX ) 0.4 MG CAPS capsule 0.8 mg daily.     Tiotropium Bromide Monohydrate  (SPIRIVA  RESPIMAT) 2.5 MCG/ACT AERS Inhale 2 puffs into the lungs daily. 1 each 11   torsemide  (DEMADEX )  20 MG tablet Take 1 tablet (20 mg total) by mouth daily. 30 tablet 0   vitamin B-12 (CYANOCOBALAMIN ) 1000 MCG tablet Take 1,000 mcg by mouth daily.     No current facility-administered medications for this visit.    OBJECTIVE: Vitals:   09/18/23 0939  BP: (!) 123/91  Pulse: 78  Resp: 14  Temp: 97.8 F (36.6 C)  SpO2: 95%      Body mass index is 32.96 kg/m.    ECOG FS:1 - Symptomatic but completely ambulatory  General: Well-developed, well-nourished, no acute distress.  Sitting in a wheelchair. Eyes: Pink conjunctiva, anicteric sclera. HEENT: Normocephalic, moist mucous membranes. Lungs: No audible wheezing or coughing. Heart: Regular rate and rhythm. Abdomen: Soft, nontender, no obvious distention. Musculoskeletal: No edema, cyanosis, or clubbing. Neuro: Alert, answering all questions appropriately. Cranial nerves grossly intact. Skin: No rashes or petechiae noted. Psych: Normal affect.   LAB RESULTS:  Lab  Results  Component Value Date   NA 133 (L) 09/18/2023   K 3.8 09/18/2023   CL 100 09/18/2023   CO2 23 09/18/2023   GLUCOSE 119 (H) 09/18/2023   BUN 20 09/18/2023   CREATININE 1.51 (H) 09/18/2023   CALCIUM 8.7 (L) 09/18/2023   PROT 6.9 09/18/2023   ALBUMIN 3.3 (L) 09/18/2023   AST 20 09/18/2023   ALT 11 09/18/2023   ALKPHOS 109 09/18/2023   BILITOT 0.5 09/18/2023   GFRNONAA 46 (L) 09/18/2023   GFRAA 58 (L) 07/18/2019    Lab Results  Component Value Date   WBC 7.3 09/18/2023   NEUTROABS 5.3 09/18/2023   HGB 13.4 09/18/2023   HCT 41.2 09/18/2023   MCV 91.2 09/18/2023   PLT 217 09/18/2023     STUDIES: MR Brain W Wo Contrast Result Date: 09/15/2023 EXAMINATION: MR BRAIN W WO CONTRAST HISTORY: small cell lung cancer, new onset of dizziness with vomiting TECHNIQUE: MRI of the brain performed with and without IV contrast. CONTRAST: 10 mL Vueway  IV COMPARISON: 02/14/2023 FINDINGS: No evidence for intracranial mass, hemorrhage, or acute infarct. There is mild  diffuse cerebral volume loss. There are mild bilateral periventricular and subcortical white matter T2 hyperintensities, which are nonspecific, but most likely secondary to chronic microvascular ischemia. The ventricles are symmetric and the basilar cisterns are patent. 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 The paranasal sinuses and mastoid air cells appear well aerated. The orbits are normal. There is no abnormal enhancement following the administration of intravenous contrast material. IMPRESSION: No acute findings. No evidence for intracranial metastases. Mild chronic microvascular ischemic changes. 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 Electronically signed by: [place] Engel MD 09/15/2023 07:12 AM EDT RP Workstation: [id number]   DG Chest Port 1 View Result Date: 09/08/2023 CLINICAL DATA:  Status post left thoracentesis. EXAM: PORTABLE CHEST 1 VIEW COMPARISON:  July 11, 2023. FINDINGS: Stable cardiomediastinal silhouette. Right internal jugular Port-A-Cath is unchanged. Right lung is clear. Left upper lobe opacity is noted concerning for possible pneumonia or neoplasm. Minimal left basilar subsegmental atelectasis or scarring is noted. Bony thorax is unremarkable. IMPRESSION: Left upper lobe opacity is noted concerning for possible pneumonia or neoplasm. Minimal left basilar subsegmental atelectasis or scarring Electronically Signed   By: Rosalene Colon M.D.   On: 09/08/2023 15:28   US  THORACENTESIS ASP PLEURAL SPACE W/IMG GUIDE Result Date: 09/08/2023 INDICATION: Patient with history of stage IV small cell carcinoma of the left lung, recurrent left pleural effusion. Request for therapeutic left thoracentesis. EXAM: ULTRASOUND GUIDED LEFT THORACENTESIS MEDICATIONS: 7 mL 1% lidocaine  COMPLICATIONS: None immediate. PROCEDURE: An ultrasound guided thoracentesis was thoroughly discussed with the patient and questions answered. The benefits, risks, alternatives and  complications were also discussed. The patient understands and wishes to proceed with the procedure. Written consent was obtained. Ultrasound was performed to localize and mark an adequate pocket of fluid in the left chest. The area was then prepped and draped in the normal sterile fashion. 1% Lidocaine  was used for local anesthesia. Under ultrasound guidance a 6 Fr Safe-T-Centesis catheter was introduced. Thoracentesis was performed. The catheter was removed and a dressing applied. FINDINGS: A total of approximately 1.4 L of serosanguineous fluid was removed. IMPRESSION: Successful ultrasound guided left thoracentesis yielding 1.4 L of pleural fluid. Performed by Nathan Bake, PA-C Electronically Signed   By: Erica Hau M.D.   On: 09/08/2023 15:23   CT CHEST ABDOMEN PELVIS W CONTRAST Result Date: 09/04/2023 CLINICAL DATA:  Follow-up  small cell lung cancer. Assess treatment response. * Tracking Code: BO * EXAM: CT CHEST, ABDOMEN, AND PELVIS WITH CONTRAST TECHNIQUE: Multidetector CT imaging of the chest, abdomen and pelvis was performed following the standard protocol during bolus administration of intravenous contrast. RADIATION DOSE REDUCTION: This exam was performed according to the departmental dose-optimization program which includes automated exposure control, adjustment of the mA and/or kV according to patient size and/or use of iterative reconstruction technique. CONTRAST:  ISOVUE -370 IOPAMIDOL  (ISOVUE -370) INJECTION 76% COMPARISON:  FDG PET scan 05/29/2023 FINDINGS: CT CHEST FINDINGS Cardiovascular: No significant vascular findings. Normal heart size. No pericardial effusion. Mediastinum/Nodes: Interval enlargement of mediastinal lymph nodes. Subcarinal lymph node measures 3.4 cm short axis compared to 1.1 cm nodule on comparison FDG PET scan. Marked enlargement of RIGHT lower paratracheal lymph node measuring 3.6 cm increased from 1 cm. Large posterior LEFT hilar lymph node measuring 3.1  cm is new from prior. Lungs/Pleura: Interval increase in LEFT pleural effusion. Along the pleural surface of the LEFT lower lobe there is new nodularity. For example LEFT lobe pleural base nodule measuring 2.5 cm on image 44. Medial LEFT lobe pleural nodule measuring 1.4 cm image 44. Peribronchial thickening in the LEFT upper lobe related to radiation change. Increase in parenchymal nodule in the superior aspect of the LEFT lower lobe measuring 2.1 cm on image 43/3 increased from 1.4 cm. Extensive centrilobular emphysema the upper lobes. No RIGHT lung nodularity Musculoskeletal: No aggressive osseous lesion. CT ABDOMEN AND PELVIS FINDINGS Hepatobiliary: New hypoenhancing lesions in LEFT and RIGHT hepatic lobe. Example central LEFT hepatic lobe lesion measuring 3.8 cm on image 64. Lateral RIGHT hepatic lobe lesion measuring 5.4 cm on image 63. Pancreas: Pancreas is normal. No ductal dilatation. No pancreatic inflammation. Spleen: Normal spleen Adrenals/urinary tract: Adrenal glands and kidneys are normal. The ureters and bladder normal. Stomach/Bowel: Stomach, small bowel, appendix, and cecum are normal. The colon and rectosigmoid colon are normal. Vascular/Lymphatic: Abdominal aorta is normal caliber. There is no retroperitoneal or periportal lymphadenopathy. No pelvic lymphadenopathy. Reproductive: Unremarkable Other: No free fluid. Musculoskeletal: No aggressive osseous lesion. IMPRESSION: CHEST: 1. Marked interval enlargement of mediastinal and LEFT hilar lymph nodes consistent with progression of nodal metastasis. 2. New pleural nodularity in the LEFT lower lobe consistent with pleural metastasis. 3. Increase in size of parenchymal nodule in the LEFT lower lobe consistent with local lung cancer recurrence. 4. Increase in size of LEFT pleural effusion. PELVIS: 1. New multifocal hepatic metastasis. 2. Normal adrenal glands. 3. No abdominal adenopathy. 4. No evidence skeletal metastasis. Electronically Signed   By:  Deboraha Fallow M.D.   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  Patient completed cycle 4 of carboplatinum, etoposide , and Tecentriq  on May 19, 2023.  Repeat PET scan on June 07, 2023 reviewed independently with significant improvement of disease burden and patient continued with maintenance Tecentriq .  CT scan results from Sep 04, 2023 reviewed independently with significant progression of disease in both lungs and liver.  MRI of the brain on Sep 13, 2023 was negative for disease.  Hospice and end-of-life were briefly discussed, but patient wishes to continue with treatment.  Tecentriq  has been discontinued.  Proceed with cycle 1 of Lurbinectedin  today.  Return to clinic in 1 week for laboratory work and further evaluation and then in 3  weeks for further evaluation and consideration of cycle 2.  Will repeat imaging at the conclusion of cycle 4. Clinical stage IIB squamous cell carcinoma, left upper lobe lung:  Patient underwent biopsy with navigational bronchoscopy on October 19, 2018 confirming diagnosis.  Patient declined surgery and wished to pursue XRT along with concurrent chemotherapy.  Given his stage of disease he did not receive maintenance immunotherapy.  He completed cycle 7 of weekly carboplatinum and Taxol  on January 09, 2019 and then completed XRT on January 14, 2019.   History of melanoma: Unclear stage or depth, although by report was in situ.  Patient had Mohs surgery in fall 2019.  Previous lung biopsy consistent with squamous cell carcinoma. Anxiety/depression: Improved.  Continue current medications as prescribed.  Continue follow-up with primary care physician as well as psychiatry at the [place]. Shortness of breath/cough: Chronic and unchanged.  Patient underwent thoracentesis on Sep 08, 2023 removing 1.4  L of fluids.  Continue supplemental oxygen as needed.  We briefly discussed Pleurx catheter today which patient is not interested at this time but would consider in the future if repeated thoracentesis were necessary.  Appreciate pulmonary input. Pain: Does not complain of this today.  Continue oxycodone  as needed. Peripheral edema: Chronic and unchanged.  Patient has been instructed to increase his torsemide  to daily if his blood pressure remains above 110 systolic. Hyponatremia: Mild, monitor. Renal insufficiency: Creatinine slightly worse at 1.51 today.   Dizziness: Patient does not complain of this today.  Repeat MRI negative for metastatic disease.   Patient expressed understanding and was in agreement with this plan. He also understands that He can call clinic at any time with any questions, concerns, or complaints.    Cancer Staging  Small cell lung cancer, left [facility]) Staging form: Lung, AJCC 8th Edition - Clinical stage from 02/24/2023: Stage IVA (cT4, cN2, cM1a) - Signed by Shellie Dials, MD on 02/24/2023  Squamous cell carcinoma lung, left (HCC) Staging form: Lung, AJCC 8th Edition - Clinical stage from 10/27/2018: Stage IIB (cT1c, cN1, cM0) - Signed by Shellie Dials, MD on 11/16/2018   Shellie Dials, MD   09/18/2023 10:19 AM

## 2023-09-18 NOTE — Patient Instructions (Signed)
 [facility] - A DEPT OF [facility]  Discharge Instructions: Thank you for choosing [facility] to provide your oncology and hematology care.  If you have a lab appointment with the Cancer Center, please go directly to the Cancer Center and check in at the registration area.  Wear comfortable clothing and clothing appropriate for easy access to any Portacath or PICC line.   We strive to give you quality time with your provider. You may need to reschedule your appointment if you arrive late (15 or more minutes).  Arriving late affects you and other patients whose appointments are after yours.  Also, if you miss three or more appointments without notifying the office, you may be dismissed from the clinic at the provider's discretion.      For prescription refill requests, have your pharmacy contact our office and allow 72 hours for refills to be completed.    Today you received the following chemotherapy and/or immunotherapy agents Lurbinectedin       To help prevent nausea and vomiting after your treatment, we encourage you to take your nausea medication as directed.  BELOW ARE SYMPTOMS THAT SHOULD BE REPORTED IMMEDIATELY: *FEVER GREATER THAN 100.4 F (38 C) OR HIGHER *CHILLS OR SWEATING *NAUSEA AND VOMITING THAT IS NOT CONTROLLED WITH YOUR NAUSEA MEDICATION *UNUSUAL SHORTNESS OF BREATH *UNUSUAL BRUISING OR BLEEDING *URINARY PROBLEMS (pain or burning when urinating, or frequent urination) *BOWEL PROBLEMS (unusual diarrhea, constipation, pain near the anus) TENDERNESS IN MOUTH AND THROAT WITH OR WITHOUT PRESENCE OF ULCERS (sore throat, sores in mouth, or a toothache) UNUSUAL RASH, SWELLING OR PAIN  UNUSUAL VAGINAL DISCHARGE OR ITCHING   Items with * indicate a potential emergency and should be followed up as soon as possible or go to the Emergency Department if any problems should occur.  Please show the CHEMOTHERAPY ALERT CARD or  IMMUNOTHERAPY ALERT CARD at check-in to the Emergency Department and triage nurse.  Should you have questions after your visit or need to cancel or reschedule your appointment, please contact [facility] - A DEPT OF [facility]  [phone] and follow the prompts.  Office hours are 8:00 a.m. to 4:30 p.m. Monday - Friday. Please note that voicemails left after 4:00 p.m. may not be returned until the following business day.  We are closed weekends and major holidays. You have access to a nurse at all times for urgent questions. Please call the main number to the clinic [phone] and follow the prompts.  For any non-urgent questions, you may also contact your provider using MyChart. We now offer e-Visits for anyone 53 and older to request care online for non-urgent symptoms. For details visit [email or web address].   Also download the MyChart app! Go to the app store, search "MyChart", open the app, select [facility], and log in with your MyChart username and password.     Lurbinectedin  Injection What is this medication? LURBINECTEDIN  (LOOR bin EK te din) treats lung cancer. It works by slowing down the growth of cancer cells. This medicine may be used for other purposes; ask your health care provider or pharmacist if you have questions. COMMON BRAND NAME(S): ZEPZELCA  What should I tell my care team before I take this medication? They need to know if you have any of these conditions: Liver disease Low blood cell levels, such as low white cells, platelets, red blood cells An unusual or allergic reaction to lurbinectedin , other medications,  foods, dyes, or preservatives If you or your partner are pregnant or trying to get pregnant Breastfeeding How should I use this medication? This medication is injected into a vein. It is given by your care team in a hospital or clinic setting. Talk to your care team about the use of this medication in children. Special  care may be needed. Overdosage: If you think you have taken too much of this medicine contact a poison control center or emergency room at once. NOTE: This medicine is only for you. Do not share this medicine with others. What if I miss a dose? Keep appointments for follow-up doses. It is important not to miss your dose. Call your care team if you are unable to keep an appointment. What may interact with this medication? Grapefruit juice or [place] oranges Other medications may affect the way this medication works. Talk with your care team about all of the medications you take. They may suggest changes to your treatment plan to lower the risk of side effects and to make sure your medications work as intended. This list may not describe all possible interactions. Give your health care provider a list of all the medicines, herbs, non-prescription drugs, or dietary supplements you use. Also tell them if you smoke, drink alcohol, or use illegal drugs. Some items may interact with your medicine. What should I watch for while using this medication? Your condition will be monitored carefully while you are receiving this medication. This medication may make you feel generally unwell. This is not uncommon as chemotherapy can affect healthy cells as well as cancer cells. Report any side effects. Continue your course of treatment even though you feel ill unless your care team tells you to stop. This medication may increase your risk of getting an infection. Call your care team for advice if you get a fever, chills, sore throat, or other symptoms of a cold or flu. Do not treat yourself. Try to avoid being around people who are sick. Avoid taking medications that contain aspirin, acetaminophen , ibuprofen, naproxen , or ketoprofen unless instructed by your care team. These medications may hide a fever. Be careful brushing or flossing your teeth or using a toothpick because you may get an infection or bleed more  easily. If you have any dental work done, tell your dentist you are receiving this medication. Talk to your care team if you may be pregnant. Serious birth defects can occur if you take this medication during pregnancy and for 6 months after the last dose. You will need a negative pregnancy test before starting this medication. Contraception is recommended while taking this medication and for 6 months after the last dose. If your partner can get pregnant, use a condom during sex while taking this medication and for 4 months after the last dose. Do not breastfeed while taking this medication and for 2 weeks after the last dose. What side effects may I notice from receiving this medication? Side effects that you should report to your care team as soon as possible: Allergic reactions--skin rash, itching, hives, swelling of the face, lips, tongue, or throat Infection--fever, chills, cough, sore throat, wounds that don't heal, pain or trouble when passing urine, general feeling of discomfort or being unwell Liver injury--right upper belly pain, loss of appetite, nausea, light-colored stool, dark yellow or brown urine, yellowing skin or eyes, unusual weakness or fatigue Low red blood cell level--unusual weakness or fatigue, dizziness, headache, trouble breathing Muscle injury--unusual weakness or fatigue, muscle pain, dark yellow  or brown urine, decrease in amount of urine Painful swelling, warmth, or redness of the skin, blisters or sores at the infusion site Unusual bruising or bleeding Side effects that usually do not require medical attention (report these to your care team if they continue or are bothersome): Constipation Cough Diarrhea Fatigue Loss of appetite Nausea This list may not describe all possible side effects. Call your doctor for medical advice about side effects. You may report side effects to FDA at 1-800-FDA-1088. Where should I keep my medication? This medication is given in a  hospital or clinic. It will not be stored at home. NOTE: This sheet is a summary. It may not cover all possible information. If you have questions about this medicine, talk to your doctor, pharmacist, or health care provider.  2024 Elsevier/Gold Standard (2021-11-30 00:00:00)

## 2023-09-18 NOTE — Progress Notes (Signed)
 Patient had an MRI on 09/13/2023. For the past week his nose has been runny. It is painful when he coughs, which he says hasn't worsened. For the last 2 weeks his appetite has decreased by 1/2.

## 2023-09-19 ENCOUNTER — Encounter: Payer: Self-pay | Admitting: Oncology

## 2023-09-26 ENCOUNTER — Other Ambulatory Visit: Payer: Self-pay | Admitting: *Deleted

## 2023-09-26 DIAGNOSIS — C3492 Malignant neoplasm of unspecified part of left bronchus or lung: Secondary | ICD-10-CM

## 2023-09-27 ENCOUNTER — Encounter: Payer: Self-pay | Admitting: Oncology

## 2023-09-27 ENCOUNTER — Inpatient Hospital Stay

## 2023-09-27 ENCOUNTER — Inpatient Hospital Stay (HOSPITAL_BASED_OUTPATIENT_CLINIC_OR_DEPARTMENT_OTHER): Admitting: Oncology

## 2023-09-27 ENCOUNTER — Encounter: Payer: Self-pay | Admitting: Cardiology

## 2023-09-27 ENCOUNTER — Ambulatory Visit: Attending: Cardiology | Admitting: Cardiology

## 2023-09-27 VITALS — BP 122/85 | HR 61 | Temp 98.3°F | Resp 18 | Ht 72.0 in | Wt 244.0 lb

## 2023-09-27 VITALS — BP 125/78 | HR 68 | Ht 72.0 in | Wt 244.2 lb

## 2023-09-27 DIAGNOSIS — Z5111 Encounter for antineoplastic chemotherapy: Secondary | ICD-10-CM | POA: Diagnosis not present

## 2023-09-27 DIAGNOSIS — I4821 Permanent atrial fibrillation: Secondary | ICD-10-CM

## 2023-09-27 DIAGNOSIS — I10 Essential (primary) hypertension: Secondary | ICD-10-CM

## 2023-09-27 DIAGNOSIS — C3492 Malignant neoplasm of unspecified part of left bronchus or lung: Secondary | ICD-10-CM | POA: Diagnosis not present

## 2023-09-27 LAB — CMP (CANCER CENTER ONLY)
ALT: 15 U/L (ref 0–44)
AST: 24 U/L (ref 15–41)
Albumin: 3.4 g/dL — ABNORMAL LOW (ref 3.5–5.0)
Alkaline Phosphatase: 88 U/L (ref 38–126)
Anion gap: 10 (ref 5–15)
BUN: 21 mg/dL (ref 8–23)
CO2: 23 mmol/L (ref 22–32)
Calcium: 8.9 mg/dL (ref 8.9–10.3)
Chloride: 103 mmol/L (ref 98–111)
Creatinine: 1.44 mg/dL — ABNORMAL HIGH (ref 0.61–1.24)
GFR, Estimated: 49 mL/min — ABNORMAL LOW (ref >60)
Glucose, Bld: 115 mg/dL — ABNORMAL HIGH (ref 70–99)
Potassium: 4.1 mmol/L (ref 3.5–5.1)
Sodium: 136 mmol/L (ref 135–145)
Total Bilirubin: 0.6 mg/dL (ref 0.0–1.2)
Total Protein: 6.8 g/dL (ref 6.5–8.1)

## 2023-09-27 LAB — CBC WITH DIFFERENTIAL/PLATELET
Abs Immature Granulocytes: 0.03 10*3/uL (ref 0.00–0.07)
Basophils Absolute: 0 10*3/uL (ref 0.0–0.1)
Basophils Relative: 1 %
Eosinophils Absolute: 0.1 10*3/uL (ref 0.0–0.5)
Eosinophils Relative: 3 %
HCT: 41.4 % (ref 39.0–52.0)
Hemoglobin: 13.4 g/dL (ref 13.0–17.0)
Immature Granulocytes: 1 %
Lymphocytes Relative: 25 %
Lymphs Abs: 0.8 10*3/uL (ref 0.7–4.0)
MCH: 29.6 pg (ref 26.0–34.0)
MCHC: 32.4 g/dL (ref 30.0–36.0)
MCV: 91.6 fL (ref 80.0–100.0)
Monocytes Absolute: 0.3 10*3/uL (ref 0.1–1.0)
Monocytes Relative: 8 %
Neutro Abs: 2.1 10*3/uL (ref 1.7–7.7)
Neutrophils Relative %: 62 %
Platelets: 166 10*3/uL (ref 150–400)
RBC: 4.52 MIL/uL (ref 4.22–5.81)
RDW: 14.3 % (ref 11.5–15.5)
WBC: 3.4 10*3/uL — ABNORMAL LOW (ref 4.0–10.5)
nRBC: 0 % (ref 0.0–0.2)

## 2023-09-27 LAB — CK: Total CK: 33 U/L — ABNORMAL LOW (ref 49–397)

## 2023-09-27 LAB — MAGNESIUM: Magnesium: 1.8 mg/dL (ref 1.7–2.4)

## 2023-09-27 MED ORDER — LEVOFLOXACIN 500 MG PO TABS: 500.0000 mg | ORAL_TABLET | Freq: Every day | ORAL | 0 refills | Status: AC

## 2023-09-27 NOTE — Patient Instructions (Signed)
 Medication Instructions:  Your Physician recommend you continue on your current medication as directed.    *If you need a refill on your cardiac medications before your next appointment, please call your pharmacy*  Lab Work: No labs ordered today  If you have labs (blood work) drawn today and your tests are completely normal, you will receive your results only by: MyChart Message (if you have MyChart) OR A paper copy in the mail If you have any lab test that is abnormal or we need to change your treatment, we will call you to review the results.  Testing/Procedures: Your physician has requested that you have an echocardiogram. Echocardiography is a painless test that uses sound waves to create images of your heart. It provides your doctor with information about the size and shape of your heart and how well your heart's chambers and valves are working.   You may receive an ultrasound enhancing agent through an IV if needed to better visualize your heart during the echo. This procedure takes approximately one hour.  There are no restrictions for this procedure.  This will take place at [address] [facility], [place] [address]  Please note: We ask at that you not bring children with you during ultrasound (echo/ vascular) testing. Due to room size and safety concerns, children are not allowed in the ultrasound rooms during exams. Our front office staff cannot provide observation of children in our lobby area while testing is being conducted. An adult accompanying a patient to their appointment will only be allowed in the ultrasound room at the discretion of the ultrasound technician under special circumstances. We apologize for any inconvenience.   Follow-Up: At [facility], you and your health needs are our priority.  As part of our continuing mission to provide you with exceptional heart care, our providers are all part of one team.  This team includes your  primary Cardiologist (physician) and Advanced Practice Providers or APPs (Physician Assistants and Nurse Practitioners) who all work together to provide you with the care you need, when you need it.  Your next appointment:   6 month(s)  Provider:   You may see Constancia Delton, MD or one of the following Advanced Practice Providers on your designated Care Team:   Laneta Pintos, NP Gildardo Labrador, PA-C Varney Gentleman, PA-C Cadence Palominas, PA-C Ronald Cockayne, NP Morey Ar, NP    We recommend signing up for the patient portal called "MyChart".  Sign up information is provided on this After Visit Summary.  MyChart is used to connect with patients for Virtual Visits (Telemedicine).  Patients are able to view lab/test results, encounter notes, upcoming appointments, etc.  Non-urgent messages can be sent to your provider as well.   To learn more about what you can do with MyChart, go to [email or web address].

## 2023-09-27 NOTE — Progress Notes (Signed)
 Having more unsteadiness and more fatigue. Has productive cough that has been going on for a while but is more often.

## 2023-09-27 NOTE — Progress Notes (Signed)
 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  INTERVAL HISTORY: Patient returns to clinic today for further evaluation and to assess his toleration of Lurbinectedin .  He complains of increased fatigue and worsening cough, but otherwise feels well and tolerating his treatments.  He continues to have chronic shortness of breath. He has no neurologic complaints.  He denies any chest pain or hemoptysis.  He has a good appetite and denies weight loss.  He denies any nausea, vomiting, constipation, or diarrhea.  He has no urinary complaints.  Patient offers no further specific complaints today.  REVIEW OF SYSTEMS:   Review of Systems  Constitutional:  Positive for malaise/fatigue. Negative for fever and weight loss.  HENT: Negative.  Negative for congestion.   Respiratory:  Positive for cough and shortness of breath. Negative for hemoptysis.   Cardiovascular: Negative.  Negative for chest pain and leg swelling.  Gastrointestinal: Negative.  Negative for abdominal pain and nausea.  Genitourinary: Negative.  Negative for dysuria.  Musculoskeletal: Negative.  Negative for back pain.  Skin:  Negative for rash.  Neurological:  Positive for weakness. Negative for dizziness, focal weakness and headaches.  Endo/Heme/Allergies:  Does not bruise/bleed easily.  Psychiatric/Behavioral: Negative.  Negative for depression. The patient is not nervous/anxious.     As per HPI.  Otherwise, a complete review of systems is negative.  PAST MEDICAL HISTORY: Past Medical History:  Diagnosis Date   Asthma    COPD (chronic obstructive pulmonary disease) (HCC)    Hearing loss    Hypertension    Hypothyroidism    Mass of left lung    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HCC)    Prostate enlargement    Shortness of breath    Squamous cell carcinoma of lung, left (HCC)    Thyroid  disease    Tobacco abuse     PAST SURGICAL HISTORY: Past Surgical History:  Procedure Laterality Date   CARDIOVERSION N/A    CARDIOVERSION N/A 11/24/2021   Procedure: CARDIOVERSION;  Surgeon: Sammy Crisp, MD;  Location: [facility] ORS;  Service: Cardiovascular;  Laterality: N/A;   ELECTROMAGNETIC NAVIGATION BROCHOSCOPY Left 10/19/2018   Procedure: ELECTROMAGNETIC NAVIGATION BRONCHOSCOPY LEFT;  Surgeon: Marc Senior, MD;  Location: [facility] ORS;  Service: Cardiopulmonary;  Laterality: Left;   ENDOBRONCHIAL ULTRASOUND Left 10/19/2018   Procedure: ENDOBRONCHIAL ULTRASOUND LEFT;  Surgeon: Marc Senior, MD;  Location: [facility] ORS;  Service: Cardiopulmonary;  Laterality: Left;   IR IMAGING GUIDED PORT INSERTION  03/03/2023   IR THORACENTESIS ASP PLEURAL SPACE W/IMG GUIDE  03/01/2023   MELANOMA EXCISION Left    NO PAST SURGERIES      FAMILY HISTORY: Family History  Problem Relation Age of Onset   Aneurysm Mother    Cancer Father     ADVANCED DIRECTIVES (Y/N):  N  HEALTH MAINTENANCE: Social History   Tobacco Use   Smoking status: Former    Current packs/day: 0.00    Average packs/day: 0.3 packs/day for 62.0 years (15.5 ttl pk-yrs)    Types: Cigarettes    Start date: 09/04/1956  Quit date: 09/05/2018    Years since quitting: 5.0   Smokeless tobacco: Never  Vaping Use   Vaping status: Never Used  Substance Use Topics   Alcohol use: No    Alcohol/week: 0.0 standard drinks of alcohol   Drug use: No     Colonoscopy:  PAP:  Bone density:  Lipid panel:  No Known Allergies  Current  Outpatient Medications  Medication Sig Dispense Refill   albuterol  (PROVENTIL  HFA;VENTOLIN  HFA) 108 (90 Base) MCG/ACT inhaler Inhale 2 puffs into the lungs every 6 (six) hours as needed for wheezing or shortness of breath. 1 Inhaler 0   apixaban  (ELIQUIS ) 5 MG TABS tablet Take 1 tablet (5 mg total) by mouth 2 (two) times daily. 60 tablet 3   carboxymethylcellulose (REFRESH PLUS) 0.5 % SOLN 1 drop 4 (four) times daily as needed.     chlorpheniramine-HYDROcodone  (TUSSIONEX) 10-8 MG/5ML Take 5 mLs by mouth every 12 (twelve) hours as needed for cough. 115 mL 0   finasteride (PROSCAR) 5 MG tablet Take 5 mg by mouth daily.     fluticasone-salmeterol (ADVAIR) 250-50 MCG/ACT AEPB Inhale 1 puff into the lungs in the morning and at bedtime.     ipratropium-albuterol  (DUONEB) 0.5-2.5 (3) MG/3ML SOLN Take 3 mLs by nebulization every 6 (six) hours as needed. 360 mL 3   levothyroxine  (SYNTHROID ) 200 MCG tablet Take 200 mcg by mouth daily before breakfast.     Magnesium  Glycinate 120 MG CAPS Take 400 mg by mouth.     Oxycodone  HCl 10 MG TABS Take 1 tablet (10 mg total) by mouth every 6 (six) hours as needed. Okay to cut tablet in half if needed. 60 tablet 0   OXYGEN Inhale 3 L into the lungs continuous.     potassium chloride  SA (KLOR-CON  M) 20 MEQ tablet TAKE 1 TABLET BY MOUTH TWICE A DAY 180 tablet 1   SEMAGLUTIDE,0.25 OR 0.5MG /DOS, [place] Inject 0.5 mg into the skin once a week.     tamsulosin  (FLOMAX ) 0.4 MG CAPS capsule 0.8 mg daily.     Tiotropium Bromide Monohydrate  (SPIRIVA  RESPIMAT) 2.5 MCG/ACT AERS Inhale 2 puffs into the lungs daily. 1 each 11   torsemide  (DEMADEX ) 20 MG tablet Take 1 tablet (20 mg total) by mouth daily. 30 tablet 0   vitamin B-12 (CYANOCOBALAMIN ) 1000 MCG tablet Take 1,000 mcg by mouth daily.     albuterol  (PROVENTIL ) (2.5 MG/3ML) 0.083% nebulizer solution Take 2.5 mg by nebulization every 6 (six) hours as needed for wheezing or shortness of breath. (Patient not taking: Reported on  09/27/2023)     glimepiride  (AMARYL ) 2 MG tablet Take 2 mg by mouth daily with breakfast. (Patient not taking: Reported on 09/27/2023)     No current facility-administered medications for this visit.    OBJECTIVE: Vitals:   09/27/23 0922  BP: 122/85  Pulse: 61  Resp: 18  Temp: 98.3 F (36.8 C)  SpO2: 100%      Body mass index is 33.09 kg/m.    ECOG FS:1 - Symptomatic but completely ambulatory  General: Well-developed, well-nourished, no acute distress. Eyes: Pink conjunctiva, anicteric sclera. HEENT: Normocephalic, moist mucous membranes. Lungs: No audible wheezing or coughing. Heart: Regular rate and rhythm. Abdomen: Soft, nontender, no obvious distention. Musculoskeletal: No edema, cyanosis, or clubbing. Neuro: Alert, answering all questions appropriately. Cranial nerves grossly intact. Skin: No rashes or petechiae noted. Psych: Normal affect.  LAB RESULTS:  Lab Results  Component Value Date   NA 136 09/27/2023   K  4.1 09/27/2023   CL 103 09/27/2023   CO2 23 09/27/2023   GLUCOSE 115 (H) 09/27/2023   BUN 21 09/27/2023   CREATININE 1.44 (H) 09/27/2023   CALCIUM 8.9 09/27/2023   PROT 6.8 09/27/2023   ALBUMIN 3.4 (L) 09/27/2023   AST 24 09/27/2023   ALT 15 09/27/2023   ALKPHOS 88 09/27/2023   BILITOT 0.6 09/27/2023   GFRNONAA 49 (L) 09/27/2023   GFRAA 58 (L) 07/18/2019    Lab Results  Component Value Date   WBC 3.4 (L) 09/27/2023   NEUTROABS 2.1 09/27/2023   HGB 13.4 09/27/2023   HCT 41.4 09/27/2023   MCV 91.6 09/27/2023   PLT 166 09/27/2023     STUDIES: MR Brain W Wo Contrast Result Date: 09/15/2023 EXAMINATION: MR BRAIN W WO CONTRAST HISTORY: small cell lung cancer, new onset of dizziness with vomiting TECHNIQUE: MRI of the brain performed with and without IV contrast. CONTRAST: 10 mL Vueway  IV COMPARISON: 02/14/2023 FINDINGS: No evidence for intracranial mass, hemorrhage, or acute infarct. There is mild diffuse cerebral volume loss. There are mild  bilateral periventricular and subcortical white matter T2 hyperintensities, which are nonspecific, but most likely secondary to chronic microvascular ischemia. The ventricles are symmetric and the basilar cisterns are patent. 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 The paranasal sinuses and mastoid air cells appear well aerated. The orbits are normal. There is no abnormal enhancement following the administration of intravenous contrast material. IMPRESSION: No acute findings. No evidence for intracranial metastases. Mild chronic microvascular ischemic changes. 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 Electronically signed by: [place] Engel MD 09/15/2023 07:12 AM EDT RP Workstation: [id number]   DG Chest Port 1 View Result Date: 09/08/2023 CLINICAL DATA:  Status post left thoracentesis. EXAM: PORTABLE CHEST 1 VIEW COMPARISON:  July 11, 2023. FINDINGS: Stable cardiomediastinal silhouette. Right internal jugular Port-A-Cath is unchanged. Right lung is clear. Left upper lobe opacity is noted concerning for possible pneumonia or neoplasm. Minimal left basilar subsegmental atelectasis or scarring is noted. Bony thorax is unremarkable. IMPRESSION: Left upper lobe opacity is noted concerning for possible pneumonia or neoplasm. Minimal left basilar subsegmental atelectasis or scarring Electronically Signed   By: Rosalene Colon M.D.   On: 09/08/2023 15:28   US  THORACENTESIS ASP PLEURAL SPACE W/IMG GUIDE Result Date: 09/08/2023 INDICATION: Patient with history of stage IV small cell carcinoma of the left lung, recurrent left pleural effusion. Request for therapeutic left thoracentesis. EXAM: ULTRASOUND GUIDED LEFT THORACENTESIS MEDICATIONS: 7 mL 1% lidocaine  COMPLICATIONS: None immediate. PROCEDURE: An ultrasound guided thoracentesis was thoroughly discussed with the patient and questions answered. The benefits, risks, alternatives and complications were also discussed. The patient  understands and wishes to proceed with the procedure. Written consent was obtained. Ultrasound was performed to localize and mark an adequate pocket of fluid in the left chest. The area was then prepped and draped in the normal sterile fashion. 1% Lidocaine  was used for local anesthesia. Under ultrasound guidance a 6 Fr Safe-T-Centesis catheter was introduced. Thoracentesis was performed. The catheter was removed and a dressing applied. FINDINGS: A total of approximately 1.4 L of serosanguineous fluid was removed. IMPRESSION: Successful ultrasound guided left thoracentesis yielding 1.4 L of pleural fluid. Performed by Nathan Bake, PA-C Electronically Signed   By: Erica Hau M.D.   On: 09/08/2023 15:23   CT CHEST ABDOMEN PELVIS W CONTRAST Result Date: 09/04/2023 CLINICAL DATA:  Follow-up small cell lung cancer. Assess treatment response. * Tracking Code: BO * EXAM: CT  CHEST, ABDOMEN, AND PELVIS WITH CONTRAST TECHNIQUE: Multidetector CT imaging of the chest, abdomen and pelvis was performed following the standard protocol during bolus administration of intravenous contrast. RADIATION DOSE REDUCTION: This exam was performed according to the departmental dose-optimization program which includes automated exposure control, adjustment of the mA and/or kV according to patient size and/or use of iterative reconstruction technique. CONTRAST:  ISOVUE -370 IOPAMIDOL  (ISOVUE -370) INJECTION 76% COMPARISON:  FDG PET scan 05/29/2023 FINDINGS: CT CHEST FINDINGS Cardiovascular: No significant vascular findings. Normal heart size. No pericardial effusion. Mediastinum/Nodes: Interval enlargement of mediastinal lymph nodes. Subcarinal lymph node measures 3.4 cm short axis compared to 1.1 cm nodule on comparison FDG PET scan. Marked enlargement of RIGHT lower paratracheal lymph node measuring 3.6 cm increased from 1 cm. Large posterior LEFT hilar lymph node measuring 3.1 cm is new from prior. Lungs/Pleura: Interval  increase in LEFT pleural effusion. Along the pleural surface of the LEFT lower lobe there is new nodularity. For example LEFT lobe pleural base nodule measuring 2.5 cm on image 44. Medial LEFT lobe pleural nodule measuring 1.4 cm image 44. Peribronchial thickening in the LEFT upper lobe related to radiation change. Increase in parenchymal nodule in the superior aspect of the LEFT lower lobe measuring 2.1 cm on image 43/3 increased from 1.4 cm. Extensive centrilobular emphysema the upper lobes. No RIGHT lung nodularity Musculoskeletal: No aggressive osseous lesion. CT ABDOMEN AND PELVIS FINDINGS Hepatobiliary: New hypoenhancing lesions in LEFT and RIGHT hepatic lobe. Example central LEFT hepatic lobe lesion measuring 3.8 cm on image 64. Lateral RIGHT hepatic lobe lesion measuring 5.4 cm on image 63. Pancreas: Pancreas is normal. No ductal dilatation. No pancreatic inflammation. Spleen: Normal spleen Adrenals/urinary tract: Adrenal glands and kidneys are normal. The ureters and bladder normal. Stomach/Bowel: Stomach, small bowel, appendix, and cecum are normal. The colon and rectosigmoid colon are normal. Vascular/Lymphatic: Abdominal aorta is normal caliber. There is no retroperitoneal or periportal lymphadenopathy. No pelvic lymphadenopathy. Reproductive: Unremarkable Other: No free fluid. Musculoskeletal: No aggressive osseous lesion. IMPRESSION: CHEST: 1. Marked interval enlargement of mediastinal and LEFT hilar lymph nodes consistent with progression of nodal metastasis. 2. New pleural nodularity in the LEFT lower lobe consistent with pleural metastasis. 3. Increase in size of parenchymal nodule in the LEFT lower lobe consistent with local lung cancer recurrence. 4. Increase in size of LEFT pleural effusion. PELVIS: 1. New multifocal hepatic metastasis. 2. Normal adrenal glands. 3. No abdominal adenopathy. 4. No evidence skeletal metastasis. Electronically Signed   By: Deboraha Fallow M.D.   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  Patient completed cycle 4 of carboplatinum, etoposide , and Tecentriq  on May 19, 2023.  Repeat PET scan on June 07, 2023 reviewed independently with significant improvement of disease burden and patient continued with maintenance Tecentriq .  CT scan results from Sep 04, 2023 reviewed independently with significant progression of disease in both lungs and liver.  MRI of the brain on Sep 13, 2023 was negative for disease.  Hospice and end-of-life were briefly discussed, but patient wishes to continue with treatment.  Tecentriq  has been discontinued.  Patient received cycle 1 of Lurbinectedin  last week and tolerated treatment well.  Return to clinic in 2 weeks as previously scheduled for further evaluation and consideration of cycle 2.  Will repeat imaging at the conclusion of  cycle 4. Clinical stage IIB squamous cell carcinoma, left upper lobe lung:  Patient underwent biopsy with navigational bronchoscopy on October 19, 2018 confirming diagnosis.  Patient declined surgery and wished to pursue XRT along with concurrent chemotherapy.  Given his stage of disease he did not receive maintenance immunotherapy.  He completed cycle 7 of weekly carboplatinum and Taxol  on January 09, 2019 and then completed XRT on January 14, 2019.   History of melanoma: Unclear stage or depth, although by report was in situ.  Patient had Mohs surgery in fall 2019.  Previous lung biopsy consistent with squamous cell carcinoma. Anxiety/depression: Improved.  Continue current medications as prescribed.  Continue follow-up with primary care physician as well as psychiatry at the [place]. Shortness of breath: Chronic and unchanged.  Patient underwent thoracentesis on Sep 08, 2023 removing 1.4 L of fluids.  Continue supplemental oxygen as  needed.  We previously discussed Pleurx catheter which patient was not interested, but would consider in the future if repeated thoracentesis were necessary.  Appreciate pulmonary input. Cough: Slightly worse this week.  Patient was given a prescription for Levaquin. Pain: Does not complain of this today.  Continue oxycodone  as needed. Peripheral edema: Chronic and unchanged.  Patient has been instructed to increase his torsemide  to daily if his blood pressure remains above 110 systolic. Hyponatremia: Mild, monitor. Renal insufficiency: Creatinine mildly improved to 1.44.  Encouraged increased fluid intake. Dizziness: Patient does not complain of this today.  Repeat MRI negative for metastatic disease.   Patient expressed understanding and was in agreement with this plan. He also understands that He can call clinic at any time with any questions, concerns, or complaints.    Cancer Staging  Small cell lung cancer, left [facility]) Staging form: Lung, AJCC 8th Edition - Clinical stage from 02/24/2023: Stage IVA (cT4, cN2, cM1a) - Signed by Shellie Dials, MD on 02/24/2023  Squamous cell carcinoma lung, left (HCC) Staging form: Lung, AJCC 8th Edition - Clinical stage from 10/27/2018: Stage IIB (cT1c, cN1, cM0) - Signed by Shellie Dials, MD on 11/16/2018   Shellie Dials, MD   09/27/2023 9:44 AM

## 2023-09-27 NOTE — Progress Notes (Signed)
 Cardiology Office Note:    Date:  09/27/2023   ID:  Levi [name], DOB 1940/05/06, MRN [id number]  PCP:  [facility]   [facility] Providers Cardiologist:  Constancia Delton, MD Electrophysiologist:  Richardo Chandler, MD     Referring MD: [facility]   Chief Complaint  Patient presents with   Follow-up    Follow up to discuss diuretic needs. The patient was taking torsemide  every other day, but oncologist and [facility] would like for him to take Torsemide  every day. Patient states that he doesn't feel good on today. Meds reviewed.     History of Present Illness:    Levi [name] is a 83 y.o. 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  Patient seen by EP due to permanent atrial fibrillation.  Has stage IV lung cancer, currently on chemotherapy.  Endorse fatigue.  States being followed at the [place] but would like to move cardiac clinic to [place].  Was previously on Lasix  every other day, currently taking torsemide  daily, edema adequately controlled.  Overall doing okay, denies any cardiac concerns at this time  Prior notes/testing Lexiscan  Myoview  10/23 no significant ischemia, EF 55 to 60%, low risk study  Past Medical History:  Diagnosis Date   Asthma    COPD (chronic obstructive pulmonary disease) (HCC)    Hearing loss    Hypertension    Hypothyroidism    Mass of left lung    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HCC)    Prostate enlargement    Shortness of breath    Squamous cell carcinoma of lung, left (HCC)    Thyroid  disease    Tobacco abuse     Past Surgical History:  Procedure Laterality Date   CARDIOVERSION N/A    CARDIOVERSION N/A 11/24/2021   Procedure: CARDIOVERSION;  Surgeon: Sammy Crisp, MD;  Location: [facility] ORS;  Service: Cardiovascular;  Laterality: N/A;   ELECTROMAGNETIC NAVIGATION BROCHOSCOPY Left 10/19/2018    Procedure: ELECTROMAGNETIC NAVIGATION BRONCHOSCOPY LEFT;  Surgeon: Marc Senior, MD;  Location: [facility] ORS;  Service: Cardiopulmonary;  Laterality: Left;   ENDOBRONCHIAL ULTRASOUND Left 10/19/2018   Procedure: ENDOBRONCHIAL ULTRASOUND LEFT;  Surgeon: Marc Senior, MD;  Location: [facility] ORS;  Service: Cardiopulmonary;  Laterality: Left;   IR IMAGING GUIDED PORT INSERTION  03/03/2023   IR THORACENTESIS ASP PLEURAL SPACE W/IMG GUIDE  03/01/2023   MELANOMA EXCISION Left    NO PAST SURGERIES      Current Medications: Current Meds  Medication Sig   albuterol  (PROVENTIL  HFA;VENTOLIN  HFA) 108 (90 Base) MCG/ACT inhaler Inhale 2 puffs into the lungs every 6 (six) hours as needed for wheezing or shortness of breath.   albuterol  (PROVENTIL ) (2.5 MG/3ML) 0.083% nebulizer solution Take 2.5 mg by nebulization every 6 (six) hours as needed for wheezing or shortness of breath.   apixaban  (ELIQUIS ) 5 MG TABS tablet Take 1 tablet (5 mg total) by mouth 2 (two) times daily.   carboxymethylcellulose (REFRESH PLUS) 0.5 % SOLN 1 drop 4 (four) times daily as needed.   chlorpheniramine-HYDROcodone  (TUSSIONEX) 10-8 MG/5ML Take 5 mLs by mouth every 12 (twelve) hours as needed for cough.   finasteride (PROSCAR) 5 MG tablet Take 5 mg by mouth daily.   fluticasone-salmeterol (ADVAIR) 250-50 MCG/ACT AEPB Inhale 1 puff into the lungs in the morning and at bedtime.   ipratropium-albuterol  (DUONEB) 0.5-2.5 (3) MG/3ML  SOLN Take 3 mLs by nebulization every 6 (six) hours as needed.   levofloxacin  (LEVAQUIN ) 500 MG tablet Take 1 tablet (500 mg total) by mouth daily for 7 days.   levothyroxine  (SYNTHROID ) 200 MCG tablet Take 200 mcg by mouth daily before breakfast.   Magnesium  Glycinate 120 MG CAPS Take 400 mg by mouth.   Oxycodone  HCl 10 MG TABS Take 1 tablet (10 mg total) by mouth every 6 (six) hours as needed. Okay to cut tablet in half if needed.   OXYGEN Inhale 3 L into the lungs continuous.   potassium chloride  SA  (KLOR-CON  M) 20 MEQ tablet TAKE 1 TABLET BY MOUTH TWICE A DAY   SEMAGLUTIDE,0.25 OR 0.5MG /DOS, [place] Inject 0.5 mg into the skin once a week.   tamsulosin  (FLOMAX ) 0.4 MG CAPS capsule 0.8 mg daily.   Tiotropium Bromide Monohydrate  (SPIRIVA  RESPIMAT) 2.5 MCG/ACT AERS Inhale 2 puffs into the lungs daily.   torsemide  (DEMADEX ) 20 MG tablet Take 1 tablet (20 mg total) by mouth daily.   vitamin B-12 (CYANOCOBALAMIN ) 1000 MCG tablet Take 1,000 mcg by mouth daily.     Allergies:   Patient has no known allergies.   Social History   Socioeconomic History   Marital status: Married    Spouse name: Levi [name]   Number of children: 5   Years of education: Not on file   Highest education level: Not on file  Occupational History   Not on file  Tobacco Use   Smoking status: Former    Current packs/day: 0.00    Average packs/day: 0.3 packs/day for 62.0 years (15.5 ttl pk-yrs)    Types: Cigarettes    Start date: 09/04/1956    Quit date: 09/05/2018    Years since quitting: 5.0   Smokeless tobacco: Never  Vaping Use   Vaping status: Never Used  Substance and Sexual Activity   Alcohol use: No    Alcohol/week: 0.0 standard drinks of alcohol   Drug use: No   Sexual activity: Not on file  Other Topics Concern   Not on file  Social History Narrative   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 He is married to his wife of 30+ years, Levi [name] and has 5 children (1 deceased), many grand children, and one great grand child.    Social Drivers of Corporate investment banker Strain: Low Risk  (12/04/2018)   Overall Financial Resource Strain (CARDIA)    Difficulty of Paying Living Expenses: Not very hard  Food Insecurity: No Food Insecurity (12/04/2018)   Hunger Vital Sign    Worried About Running Out of Food in the Last Year: Never true    Ran Out of Food  in the Last Year: Never true  Transportation Needs: No Transportation Needs (12/04/2018)   PRAPARE - Administrator, Civil Service (Medical): No    Lack of Transportation (Non-Medical): No  Physical Activity: Not on file  Stress: Stress Concern Present (12/04/2018)   [employer] of Occupational Health - Occupational Stress Questionnaire    Feeling of Stress : Very much  Social Connections: Unknown (12/04/2018)   Social Connection and Isolation Panel [NHANES]    Frequency of Communication with Friends and Family: More than three times a week    Frequency of Social Gatherings with Friends and Family: Once a week  Attends Religious Services: Not on file    Active Member of Clubs or Organizations: Not on file    Attends Banker Meetings: Not on file    Marital Status: Not on file     Family History: The patient's family history includes Aneurysm in his mother; Cancer in his father.  ROS:   Please see the history of present illness.     All other systems reviewed and are negative.  EKGs/Labs/Other Studies Reviewed:    The following studies were reviewed today:  EKG Interpretation Date/Time:  Wednesday Sep 27 2023 11:38:06 EDT Ventricular Rate:  68 PR Interval:    QRS Duration:  88 QT Interval:  388 QTC Calculation: 412 R Axis:   -35  Text Interpretation: Atrial fibrillation Left axis deviation Confirmed by Constancia Delton ([id number]) on 09/27/2023 12:39:17 PM    Recent Labs: 06/26/2023: TSH 0.163 09/27/2023: ALT 15; BUN 21; Creatinine 1.44; Hemoglobin 13.4; Magnesium  1.8; Platelets 166; Potassium 4.1; Sodium 136  Recent Lipid Panel No results found for: "CHOL", "TRIG", "HDL", "CHOLHDL", "VLDL", "LDLCALC", "LDLDIRECT"   Risk Assessment/Calculations:             Physical Exam:    VS:  BP 125/78   Pulse 68   Ht 6' (1.829 m)   Wt 244 lb 3.2 oz (110.8 kg)   SpO2 97%   BMI 33.12 kg/m     Wt Readings from Last 3 Encounters:  09/27/23 244 lb  3.2 oz (110.8 kg)  09/27/23 244 lb (110.7 kg)  09/18/23 243 lb (110.2 kg)     GEN:  Well nourished, well developed in no acute distress HEENT: Normal NECK: No JVD; No carotid bruits CARDIAC: Irregular irregular RESPIRATORY: Diminished breath sounds bilaterally ABDOMEN: Soft, non-tender, non-distended MUSCULOSKELETAL:  No edema; No deformity  SKIN: Warm and dry NEUROLOGIC:  Alert and oriented x 3 PSYCHIATRIC:  Normal affect   ASSESSMENT:    1. Permanent atrial fibrillation (HCC)   2. Primary hypertension    PLAN:    In order of problems listed above:  Permanent atrial fibrillation, heart rate controlled.  Continue Eliquis  5 mg twice daily.  Obtain echocardiogram to evaluate EF, especially with patient being on chemo. Hypertension, BP controlled.  Continue torsemide  20 mg daily.  Follow-up in 3 to 6 months     Medication Adjustments/Labs and Tests Ordered: Current medicines are reviewed at length with the patient today.  Concerns regarding medicines are outlined above.  Orders Placed This Encounter  Procedures   EKG 12-Lead   ECHOCARDIOGRAM COMPLETE   No orders of the defined types were placed in this encounter.   Patient Instructions  Medication Instructions:  Your Physician recommend you continue on your current medication as directed.    *If you need a refill on your cardiac medications before your next appointment, please call your pharmacy*  Lab Work: No labs ordered today  If you have labs (blood work) drawn today and your tests are completely normal, you will receive your results only by: MyChart Message (if you have MyChart) OR A paper copy in the mail If you have any lab test that is abnormal or we need to change your treatment, we will call you to review the results.  Testing/Procedures: Your physician has requested that you have an echocardiogram. Echocardiography is a painless test that uses sound waves to create images of your heart. It provides your  doctor with information about the size and shape of your heart and how well your heart's  chambers and valves are working.   You may receive an ultrasound enhancing agent through an IV if needed to better visualize your heart during the echo. This procedure takes approximately one hour.  There are no restrictions for this procedure.  This will take place at [address] [facility], [place] [address]  Please note: We ask at that you not bring children with you during ultrasound (echo/ vascular) testing. Due to room size and safety concerns, children are not allowed in the ultrasound rooms during exams. Our front office staff cannot provide observation of children in our lobby area while testing is being conducted. An adult accompanying a patient to their appointment will only be allowed in the ultrasound room at the discretion of the ultrasound technician under special circumstances. We apologize for any inconvenience.   Follow-Up: At [facility], you and your health needs are our priority.  As part of our continuing mission to provide you with exceptional heart care, our providers are all part of one team.  This team includes your primary Cardiologist (physician) and Advanced Practice Providers or APPs (Physician Assistants and Nurse Practitioners) who all work together to provide you with the care you need, when you need it.  Your next appointment:   6 month(s)  Provider:   You may see Constancia Delton, MD or one of the following Advanced Practice Providers on your designated Care Team:   Laneta Pintos, NP Gildardo Labrador, PA-C Varney Gentleman, PA-C Cadence Chili, PA-C Ronald Cockayne, NP Morey Ar, NP    We recommend signing up for the patient portal called "MyChart".  Sign up information is provided on this After Visit Summary.  MyChart is used to connect with patients for Virtual Visits (Telemedicine).  Patients are able to view lab/test results,  encounter notes, upcoming appointments, etc.  Non-urgent messages can be sent to your provider as well.   To learn more about what you can do with MyChart, go to [email or web address].      Signed, Constancia Delton, MD  09/27/2023 12:45 PM    [facility]

## 2023-10-09 ENCOUNTER — Inpatient Hospital Stay

## 2023-10-09 ENCOUNTER — Inpatient Hospital Stay (HOSPITAL_BASED_OUTPATIENT_CLINIC_OR_DEPARTMENT_OTHER): Admitting: Oncology

## 2023-10-09 ENCOUNTER — Inpatient Hospital Stay: Attending: Oncology

## 2023-10-09 ENCOUNTER — Encounter: Payer: Self-pay | Admitting: Oncology

## 2023-10-09 VITALS — BP 121/65 | HR 71 | Temp 98.4°F | Resp 18 | Ht 72.0 in | Wt 242.0 lb

## 2023-10-09 VITALS — BP 127/65 | HR 66 | Temp 96.2°F | Resp 19

## 2023-10-09 DIAGNOSIS — C3492 Malignant neoplasm of unspecified part of left bronchus or lung: Secondary | ICD-10-CM

## 2023-10-09 DIAGNOSIS — C3412 Malignant neoplasm of upper lobe, left bronchus or lung: Secondary | ICD-10-CM | POA: Diagnosis present

## 2023-10-09 DIAGNOSIS — Z79630 Long term (current) use of alkylating agent: Secondary | ICD-10-CM | POA: Insufficient documentation

## 2023-10-09 DIAGNOSIS — Z5111 Encounter for antineoplastic chemotherapy: Secondary | ICD-10-CM | POA: Diagnosis present

## 2023-10-09 LAB — CMP (CANCER CENTER ONLY)
ALT: 10 U/L (ref 0–44)
AST: 20 U/L (ref 15–41)
Albumin: 3.2 g/dL — ABNORMAL LOW (ref 3.5–5.0)
Alkaline Phosphatase: 87 U/L (ref 38–126)
Anion gap: 9 (ref 5–15)
BUN: 21 mg/dL (ref 8–23)
CO2: 22 mmol/L (ref 22–32)
Calcium: 8.5 mg/dL — ABNORMAL LOW (ref 8.9–10.3)
Chloride: 105 mmol/L (ref 98–111)
Creatinine: 1.46 mg/dL — ABNORMAL HIGH (ref 0.61–1.24)
GFR, Estimated: 48 mL/min — ABNORMAL LOW (ref >60)
Glucose, Bld: 120 mg/dL — ABNORMAL HIGH (ref 70–99)
Potassium: 3.7 mmol/L (ref 3.5–5.1)
Sodium: 136 mmol/L (ref 135–145)
Total Bilirubin: 0.6 mg/dL (ref 0.0–1.2)
Total Protein: 6.5 g/dL (ref 6.5–8.1)

## 2023-10-09 LAB — CBC WITH DIFFERENTIAL (CANCER CENTER ONLY)
Abs Immature Granulocytes: 0.06 10*3/uL (ref 0.00–0.07)
Basophils Absolute: 0 10*3/uL (ref 0.0–0.1)
Basophils Relative: 1 %
Eosinophils Absolute: 0.1 10*3/uL (ref 0.0–0.5)
Eosinophils Relative: 1 %
HCT: 40.3 % (ref 39.0–52.0)
Hemoglobin: 12.9 g/dL — ABNORMAL LOW (ref 13.0–17.0)
Immature Granulocytes: 1 %
Lymphocytes Relative: 21 %
Lymphs Abs: 1 10*3/uL (ref 0.7–4.0)
MCH: 29 pg (ref 26.0–34.0)
MCHC: 32 g/dL (ref 30.0–36.0)
MCV: 90.6 fL (ref 80.0–100.0)
Monocytes Absolute: 0.9 10*3/uL (ref 0.1–1.0)
Monocytes Relative: 18 %
Neutro Abs: 3 10*3/uL (ref 1.7–7.7)
Neutrophils Relative %: 58 %
Platelet Count: 209 10*3/uL (ref 150–400)
RBC: 4.45 MIL/uL (ref 4.22–5.81)
RDW: 15.2 % (ref 11.5–15.5)
WBC Count: 5 10*3/uL (ref 4.0–10.5)
nRBC: 0 % (ref 0.0–0.2)

## 2023-10-09 LAB — MAGNESIUM: Magnesium: 1.8 mg/dL (ref 1.7–2.4)

## 2023-10-09 MED ORDER — SODIUM CHLORIDE 0.9 % IV SOLN
3.2000 mg/m2 | Freq: Once | INTRAVENOUS | Status: AC
  Administered 2023-10-09: 7.65 mg via INTRAVENOUS
  Filled 2023-10-09: qty 15.3

## 2023-10-09 MED ORDER — SODIUM CHLORIDE 0.9 % IV SOLN
INTRAVENOUS | Status: DC
  Filled 2023-10-09: qty 250

## 2023-10-09 MED ORDER — HEPARIN SOD (PORK) LOCK FLUSH 100 UNIT/ML IV SOLN
500.0000 [IU] | Freq: Once | INTRAVENOUS | Status: AC | PRN
  Administered 2023-10-09: 500 [IU]
  Filled 2023-10-09: qty 5

## 2023-10-09 MED ORDER — DEXAMETHASONE SODIUM PHOSPHATE 10 MG/ML IJ SOLN
10.0000 mg | Freq: Once | INTRAMUSCULAR | Status: AC
  Administered 2023-10-09: 10 mg via INTRAVENOUS
  Filled 2023-10-09: qty 1

## 2023-10-09 MED ORDER — PALONOSETRON HCL INJECTION 0.25 MG/5ML
0.2500 mg | Freq: Once | INTRAVENOUS | Status: AC
  Administered 2023-10-09: 0.25 mg via INTRAVENOUS
  Filled 2023-10-09: qty 5

## 2023-10-09 NOTE — Progress Notes (Signed)
 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  INTERVAL HISTORY: Patient returns to clinic today for further evaluation and consideration of cycle 2 of Lurbinectedin .  He tolerated his first treatment well without significant side effects.  He continues to have chronic weakness and fatigue as well as chronic shortness of breath.  He has occasional episodes of dizziness that are brief and resolved without intervention.  He has no other neurologic complaints.  He denies any chest pain or hemoptysis.  He has a good appetite and denies weight loss.  He denies any nausea, vomiting, constipation, or diarrhea.  He has no urinary complaints.  Patient offers no further specific complaints today.  REVIEW OF SYSTEMS:   Review of Systems  Constitutional:  Positive for malaise/fatigue. Negative for fever and weight loss.  HENT: Negative.  Negative for congestion.   Respiratory:  Positive for cough and shortness of breath. Negative for hemoptysis.   Cardiovascular: Negative.  Negative for chest pain and leg swelling.  Gastrointestinal: Negative.  Negative for abdominal pain and nausea.  Genitourinary: Negative.  Negative for dysuria.  Musculoskeletal: Negative.  Negative for back pain.  Skin:  Negative for rash.  Neurological:  Positive for weakness. Negative for dizziness, focal weakness and headaches.  Endo/Heme/Allergies:  Does not bruise/bleed easily.  Psychiatric/Behavioral:  Negative.  Negative for depression. The patient is not nervous/anxious.     As per HPI. Otherwise, a complete review of systems is negative.  PAST MEDICAL HISTORY: Past Medical History:  Diagnosis Date   Asthma    COPD (chronic obstructive pulmonary disease) (HCC)    Hearing loss    Hypertension    Hypothyroidism    Mass of left lung    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HCC)    Prostate enlargement    Shortness of breath    Squamous cell carcinoma of lung, left (HCC)    Thyroid  disease    Tobacco abuse     PAST SURGICAL HISTORY: Past Surgical History:  Procedure Laterality Date   CARDIOVERSION N/A    CARDIOVERSION N/A 11/24/2021   Procedure: CARDIOVERSION;  Surgeon: Sammy Crisp, MD;  Location: [facility] ORS;  Service: Cardiovascular;  Laterality: N/A;   ELECTROMAGNETIC NAVIGATION BROCHOSCOPY Left 10/19/2018   Procedure: ELECTROMAGNETIC NAVIGATION BRONCHOSCOPY LEFT;  Surgeon: Marc Senior, MD;  Location: [facility] ORS;  Service: Cardiopulmonary;  Laterality: Left;   ENDOBRONCHIAL ULTRASOUND Left 10/19/2018   Procedure: ENDOBRONCHIAL ULTRASOUND LEFT;  Surgeon: Marc Senior, MD;  Location: [facility] ORS;  Service: Cardiopulmonary;  Laterality: Left;   IR IMAGING GUIDED PORT INSERTION  03/03/2023   IR THORACENTESIS ASP PLEURAL SPACE W/IMG GUIDE  03/01/2023   MELANOMA EXCISION Left    NO PAST SURGERIES      FAMILY HISTORY: Family History  Problem Relation Age of Onset   Aneurysm Mother    Cancer Father     ADVANCED DIRECTIVES (Y/N):  N  HEALTH MAINTENANCE: Social History   Tobacco Use   Smoking status: Former    Current packs/day: 0.00    Average packs/day: 0.3 packs/day  for 62.0 years (15.5 ttl pk-yrs)    Types: Cigarettes    Start date: 09/04/1956    Quit date: 09/05/2018    Years since quitting: 5.0   Smokeless tobacco: Never  Vaping Use   Vaping status: Never Used  Substance Use Topics   Alcohol use: No    Alcohol/week: 0.0 standard drinks of alcohol   Drug use: No      Colonoscopy:  PAP:  Bone density:  Lipid panel:  No Known Allergies  Current Outpatient Medications  Medication Sig Dispense Refill   albuterol  (PROVENTIL  HFA;VENTOLIN  HFA) 108 (90 Base) MCG/ACT inhaler Inhale 2 puffs into the lungs every 6 (six) hours as needed for wheezing or shortness of breath. 1 Inhaler 0   albuterol  (PROVENTIL ) (2.5 MG/3ML) 0.083% nebulizer solution Take 2.5 mg by nebulization every 6 (six) hours as needed for wheezing or shortness of breath.     apixaban  (ELIQUIS ) 5 MG TABS tablet Take 1 tablet (5 mg total) by mouth 2 (two) times daily. 60 tablet 3   carboxymethylcellulose (REFRESH PLUS) 0.5 % SOLN 1 drop 4 (four) times daily as needed.     chlorpheniramine-HYDROcodone  (TUSSIONEX) 10-8 MG/5ML Take 5 mLs by mouth every 12 (twelve) hours as needed for cough. 115 mL 0   finasteride (PROSCAR) 5 MG tablet Take 5 mg by mouth daily.     fluticasone-salmeterol (ADVAIR) 250-50 MCG/ACT AEPB Inhale 1 puff into the lungs in the morning and at bedtime.     ipratropium-albuterol  (DUONEB) 0.5-2.5 (3) MG/3ML SOLN Take 3 mLs by nebulization every 6 (six) hours as needed. 360 mL 3   levothyroxine  (SYNTHROID ) 200 MCG tablet Take 200 mcg by mouth daily before breakfast.     Magnesium  Glycinate 120 MG CAPS Take 400 mg by mouth.     Oxycodone  HCl 10 MG TABS Take 1 tablet (10 mg total) by mouth every 6 (six) hours as needed. Okay to cut tablet in half if needed. 60 tablet 0   OXYGEN Inhale 3 L into the lungs continuous.     potassium chloride  SA (KLOR-CON  M) 20 MEQ tablet TAKE 1 TABLET BY MOUTH TWICE A DAY 180 tablet 1   prochlorperazine  (COMPAZINE ) 10 MG tablet Take 10 mg by mouth every 6 (six) hours as needed.     SEMAGLUTIDE,0.25 OR 0.5MG /DOS, [place] Inject 0.5 mg into the skin once a week.     tamsulosin  (FLOMAX ) 0.4 MG CAPS capsule 0.8 mg daily.     Tiotropium Bromide Monohydrate  (SPIRIVA  RESPIMAT) 2.5 MCG/ACT AERS Inhale 2 puffs into the lungs daily. 1 each 11   torsemide   (DEMADEX ) 20 MG tablet Take 1 tablet (20 mg total) by mouth daily. 30 tablet 0   vitamin B-12 (CYANOCOBALAMIN ) 1000 MCG tablet Take 1,000 mcg by mouth daily.     glimepiride  (AMARYL ) 2 MG tablet Take 2 mg by mouth daily with breakfast. (Patient not taking: Reported on 10/09/2023)     No current facility-administered medications for this visit.    OBJECTIVE: Vitals:   10/09/23 0911  BP: 121/65  Pulse: 71  Resp: 18  Temp: 98.4 F (36.9 C)  SpO2: 96%      Body mass index is 32.82 kg/m.    ECOG FS:1 - Symptomatic but completely ambulatory  General: Well-developed, well-nourished, no acute distress.  Sitting in wheelchair. Eyes: Pink conjunctiva, anicteric sclera. HEENT: Normocephalic, moist mucous membranes. Lungs: No audible wheezing or coughing. Heart: Regular rate and rhythm. Abdomen: Soft, nontender, no obvious distention. Musculoskeletal: No edema, cyanosis, or  clubbing. Neuro: Alert, answering all questions appropriately. Cranial nerves grossly intact. Skin: No rashes or petechiae noted. Psych: Normal affect.  LAB RESULTS:  Lab Results  Component Value Date   NA 136 10/09/2023   K 3.7 10/09/2023   CL 105 10/09/2023   CO2 22 10/09/2023   GLUCOSE 120 (H) 10/09/2023   BUN 21 10/09/2023   CREATININE 1.46 (H) 10/09/2023   CALCIUM 8.5 (L) 10/09/2023   PROT 6.5 10/09/2023   ALBUMIN 3.2 (L) 10/09/2023   AST 20 10/09/2023   ALT 10 10/09/2023   ALKPHOS 87 10/09/2023   BILITOT 0.6 10/09/2023   GFRNONAA 48 (L) 10/09/2023   GFRAA 58 (L) 07/18/2019    Lab Results  Component Value Date   WBC 5.0 10/09/2023   NEUTROABS 3.0 10/09/2023   HGB 12.9 (L) 10/09/2023   HCT 40.3 10/09/2023   MCV 90.6 10/09/2023   PLT 209 10/09/2023     STUDIES: MR Brain W Wo Contrast Result Date: 09/15/2023 EXAMINATION: MR BRAIN W WO CONTRAST HISTORY: small cell lung cancer, new onset of dizziness with vomiting TECHNIQUE: MRI of the brain performed with and without IV contrast. CONTRAST: 10  mL Vueway  IV COMPARISON: 02/14/2023 FINDINGS: No evidence for intracranial mass, hemorrhage, or acute infarct. There is mild diffuse cerebral volume loss. There are mild bilateral periventricular and subcortical white matter T2 hyperintensities, which are nonspecific, but most likely secondary to chronic microvascular ischemia. The ventricles are symmetric and the basilar cisterns are patent. 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 The paranasal sinuses and mastoid air cells appear well aerated. The orbits are normal. There is no abnormal enhancement following the administration of intravenous contrast material. IMPRESSION: No acute findings. No evidence for intracranial metastases. Mild chronic microvascular ischemic changes. 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  Patient completed cycle 4 of carboplatinum, etoposide , and Tecentriq  on May 19, 2023.  Repeat PET scan on June 07, 2023 reviewed independently with significant improvement of disease burden and patient continued with maintenance Tecentriq .  CT scan results from Sep 04, 2023 reviewed independently with significant progression of disease in both lungs and liver.  MRI of the brain on Sep 13, 2023 was negative for disease.  Hospice and end-of-life were briefly discussed, but patient wishes to continue with treatment.  Tecentriq  has been discontinued.  Proceed with cycle 2 of lurbinectedin  today.  Return to clinic in 3 weeks for further evaluation and consideration of cycle 3.  Will repeat imaging at the conclusion of cycle 4. Clinical stage IIB squamous cell carcinoma, left upper lobe lung:  Patient underwent biopsy with  navigational bronchoscopy on October 19, 2018 confirming diagnosis.  Patient declined surgery and wished to pursue XRT along with concurrent chemotherapy.  Given his stage of disease he did not receive maintenance immunotherapy.  He completed cycle 7 of weekly carboplatinum and Taxol  on January 09, 2019 and then completed XRT on January 14, 2019.   History of melanoma: Unclear stage or depth, although by report was in situ.  Patient had Mohs surgery in fall 2019.  Previous lung biopsy consistent with squamous cell carcinoma. Anxiety/depression: Improved.  Continue current medications as prescribed.  Continue follow-up with primary care physician  as well as psychiatry at the [place]. Shortness of breath: Chronic and unchanged.  Patient's most recent thoracentesis on Sep 08, 2023 removed 1.4 L of fluid.  Continue supplemental oxygen as needed.  We previously discussed Pleurx catheter, but this does not appear necessary at this point. Appreciate pulmonary input. Cough: Chronic and unchanged.  Patient completed a 7-day course of Levaquin  recently. Pain: Does not complain of this today.  Continue oxycodone  as needed. Peripheral edema: Chronic and changed.  Patient has been instructed to increase his torsemide  to daily if his blood pressure remains above 110 systolic. Hyponatremia: Resolved.   Renal insufficiency: Chronic and unchanged.  Patient's creatinine is 1.46.   Dizziness: Intermittent.  Repeat MRI negative for metastatic disease.   Patient expressed understanding and was in agreement with this plan. He also understands that He can call clinic at any time with any questions, concerns, or complaints.    Cancer Staging  Small cell lung cancer, left [facility]) Staging form: Lung, AJCC 8th Edition - Clinical stage from 02/24/2023: Stage IVA (cT4, cN2, cM1a) - Signed by Shellie Dials, MD on 02/24/2023  Squamous cell carcinoma lung, left (HCC) Staging form: Lung, AJCC 8th Edition - Clinical stage from  10/27/2018: Stage IIB (cT1c, cN1, cM0) - Signed by Shellie Dials, MD on 11/16/2018   Shellie Dials, MD   10/09/2023 9:43 AM

## 2023-10-09 NOTE — Patient Instructions (Signed)

## 2023-10-09 NOTE — Patient Instructions (Signed)
 [facility] - A DEPT OF [facility]  Discharge Instructions: Thank you for choosing [facility] to provide your oncology and hematology care.  If you have a lab appointment with the Cancer Center, please go directly to the Cancer Center and check in at the registration area.  Wear comfortable clothing and clothing appropriate for easy access to any Portacath or PICC line.   We strive to give you quality time with your provider. You may need to reschedule your appointment if you arrive late (15 or more minutes).  Arriving late affects you and other patients whose appointments are after yours.  Also, if you miss three or more appointments without notifying the office, you may be dismissed from the clinic at the provider's discretion.      For prescription refill requests, have your pharmacy contact our office and allow 72 hours for refills to be completed.    Today you received the following chemotherapy and/or immunotherapy agents lurbinectedin       To help prevent nausea and vomiting after your treatment, we encourage you to take your nausea medication as directed.  BELOW ARE SYMPTOMS THAT SHOULD BE REPORTED IMMEDIATELY: *FEVER GREATER THAN 100.4 F (38 C) OR HIGHER *CHILLS OR SWEATING *NAUSEA AND VOMITING THAT IS NOT CONTROLLED WITH YOUR NAUSEA MEDICATION *UNUSUAL SHORTNESS OF BREATH *UNUSUAL BRUISING OR BLEEDING *URINARY PROBLEMS (pain or burning when urinating, or frequent urination) *BOWEL PROBLEMS (unusual diarrhea, constipation, pain near the anus) TENDERNESS IN MOUTH AND THROAT WITH OR WITHOUT PRESENCE OF ULCERS (sore throat, sores in mouth, or a toothache) UNUSUAL RASH, SWELLING OR PAIN  UNUSUAL VAGINAL DISCHARGE OR ITCHING   Items with * indicate a potential emergency and should be followed up as soon as possible or go to the Emergency Department if any problems should occur.  Please show the CHEMOTHERAPY ALERT CARD or  IMMUNOTHERAPY ALERT CARD at check-in to the Emergency Department and triage nurse.  Should you have questions after your visit or need to cancel or reschedule your appointment, please contact [facility] - A DEPT OF [facility]  [phone] and follow the prompts.  Office hours are 8:00 a.m. to 4:30 p.m. Monday - Friday. Please note that voicemails left after 4:00 p.m. may not be returned until the following business day.  We are closed weekends and major holidays. You have access to a nurse at all times for urgent questions. Please call the main number to the clinic [phone] and follow the prompts.  For any non-urgent questions, you may also contact your provider using MyChart. We now offer e-Visits for anyone 29 and older to request care online for non-urgent symptoms. For details visit [email or web address].   Also download the MyChart app! Go to the app store, search "MyChart", open the app, select [place], and log in with your MyChart username and password.

## 2023-10-11 ENCOUNTER — Ambulatory Visit: Attending: Cardiology

## 2023-10-11 DIAGNOSIS — I4821 Permanent atrial fibrillation: Secondary | ICD-10-CM | POA: Diagnosis not present

## 2023-10-11 LAB — ECHOCARDIOGRAM COMPLETE
AR max vel: 3.64 cm2
AV Area VTI: 3.38 cm2
AV Area mean vel: 3.57 cm2
AV Mean grad: 3 mmHg
AV Peak grad: 5.8 mmHg
Ao pk vel: 1.2 m/s
Calc EF: 55.4 %
S' Lateral: 3.1 cm
Single Plane A2C EF: 56.6 %
Single Plane A4C EF: 53.9 %

## 2023-10-13 ENCOUNTER — Ambulatory Visit: Payer: Self-pay | Admitting: Cardiology

## 2023-10-30 ENCOUNTER — Inpatient Hospital Stay: Admitting: Hospice and Palliative Medicine

## 2023-10-30 ENCOUNTER — Inpatient Hospital Stay

## 2023-10-30 ENCOUNTER — Inpatient Hospital Stay: Admitting: Oncology

## 2023-10-30 ENCOUNTER — Encounter: Payer: Self-pay | Admitting: Oncology

## 2023-10-30 VITALS — BP 150/77 | HR 57 | Temp 98.4°F | Resp 18 | Ht 72.0 in | Wt 248.0 lb

## 2023-10-30 VITALS — BP 121/63

## 2023-10-30 DIAGNOSIS — C3492 Malignant neoplasm of unspecified part of left bronchus or lung: Secondary | ICD-10-CM

## 2023-10-30 DIAGNOSIS — Z515 Encounter for palliative care: Secondary | ICD-10-CM | POA: Diagnosis not present

## 2023-10-30 DIAGNOSIS — Z5111 Encounter for antineoplastic chemotherapy: Secondary | ICD-10-CM | POA: Diagnosis not present

## 2023-10-30 LAB — CMP (CANCER CENTER ONLY)
ALT: 11 U/L (ref 0–44)
AST: 21 U/L (ref 15–41)
Albumin: 3.3 g/dL — ABNORMAL LOW (ref 3.5–5.0)
Alkaline Phosphatase: 79 U/L (ref 38–126)
Anion gap: 9 (ref 5–15)
BUN: 14 mg/dL (ref 8–23)
CO2: 21 mmol/L — ABNORMAL LOW (ref 22–32)
Calcium: 8.6 mg/dL — ABNORMAL LOW (ref 8.9–10.3)
Chloride: 104 mmol/L (ref 98–111)
Creatinine: 1.34 mg/dL — ABNORMAL HIGH (ref 0.61–1.24)
GFR, Estimated: 53 mL/min — ABNORMAL LOW (ref >60)
Glucose, Bld: 103 mg/dL — ABNORMAL HIGH (ref 70–99)
Potassium: 3.7 mmol/L (ref 3.5–5.1)
Sodium: 134 mmol/L — ABNORMAL LOW (ref 135–145)
Total Bilirubin: 0.6 mg/dL (ref 0.0–1.2)
Total Protein: 6.4 g/dL — ABNORMAL LOW (ref 6.5–8.1)

## 2023-10-30 LAB — CBC WITH DIFFERENTIAL (CANCER CENTER ONLY)
Abs Immature Granulocytes: 0.12 10*3/uL — ABNORMAL HIGH (ref 0.00–0.07)
Basophils Absolute: 0.1 10*3/uL (ref 0.0–0.1)
Basophils Relative: 1 %
Eosinophils Absolute: 0.1 10*3/uL (ref 0.0–0.5)
Eosinophils Relative: 2 %
HCT: 37.7 % — ABNORMAL LOW (ref 39.0–52.0)
Hemoglobin: 12.2 g/dL — ABNORMAL LOW (ref 13.0–17.0)
Immature Granulocytes: 2 %
Lymphocytes Relative: 20 %
Lymphs Abs: 1.1 10*3/uL (ref 0.7–4.0)
MCH: 29.1 pg (ref 26.0–34.0)
MCHC: 32.4 g/dL (ref 30.0–36.0)
MCV: 90 fL (ref 80.0–100.0)
Monocytes Absolute: 1.1 10*3/uL — ABNORMAL HIGH (ref 0.1–1.0)
Monocytes Relative: 19 %
Neutro Abs: 3 10*3/uL (ref 1.7–7.7)
Neutrophils Relative %: 56 %
Platelet Count: 251 10*3/uL (ref 150–400)
RBC: 4.19 MIL/uL — ABNORMAL LOW (ref 4.22–5.81)
RDW: 16.6 % — ABNORMAL HIGH (ref 11.5–15.5)
WBC Count: 5.4 10*3/uL (ref 4.0–10.5)
nRBC: 0 % (ref 0.0–0.2)

## 2023-10-30 LAB — MAGNESIUM: Magnesium: 1.7 mg/dL (ref 1.7–2.4)

## 2023-10-30 MED ORDER — HEPARIN SOD (PORK) LOCK FLUSH 100 UNIT/ML IV SOLN
500.0000 [IU] | Freq: Once | INTRAVENOUS | Status: AC | PRN
  Administered 2023-10-30: 500 [IU]
  Filled 2023-10-30: qty 5

## 2023-10-30 MED ORDER — SODIUM CHLORIDE 0.9 % IV SOLN
3.2000 mg/m2 | Freq: Once | INTRAVENOUS | Status: AC
  Administered 2023-10-30: 7.65 mg via INTRAVENOUS
  Filled 2023-10-30: qty 15.3

## 2023-10-30 MED ORDER — PALONOSETRON HCL INJECTION 0.25 MG/5ML
0.2500 mg | Freq: Once | INTRAVENOUS | Status: AC
  Administered 2023-10-30: 0.25 mg via INTRAVENOUS
  Filled 2023-10-30: qty 5

## 2023-10-30 MED ORDER — SODIUM CHLORIDE 0.9 % IV SOLN
INTRAVENOUS | Status: DC
  Filled 2023-10-30: qty 250

## 2023-10-30 MED ORDER — DEXAMETHASONE SODIUM PHOSPHATE 10 MG/ML IJ SOLN
10.0000 mg | Freq: Once | INTRAMUSCULAR | Status: AC
  Administered 2023-10-30: 10 mg via INTRAVENOUS
  Filled 2023-10-30: qty 1

## 2023-10-30 NOTE — Progress Notes (Signed)
  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  INTERVAL HISTORY: Patient returns to clinic today for further evaluation and consideration of cycle 3 of Lurbinectedin .  Currently feels well and is asymptomatic.  He is tolerating his treatments well without significant side effects.  He has chronic shortness of breath requiring oxygen.  He has no dizziness or other neurologic complaints today.  He denies any chest pain, cough, or hemoptysis.  He has a good appetite and denies weight loss.  He denies any nausea, vomiting, constipation, or diarrhea.  He has no urinary complaints.  Patient offers no further specific complaints today.  REVIEW OF SYSTEMS:   Review of Systems  Constitutional: Negative.  Negative for fever, malaise/fatigue and weight loss.  HENT: Negative.  Negative for congestion.   Respiratory:  Positive for shortness of breath. Negative for cough and hemoptysis.   Cardiovascular: Negative.  Negative for chest pain and leg swelling.  Gastrointestinal: Negative.  Negative for abdominal pain and nausea.  Genitourinary: Negative.  Negative for dysuria.  Musculoskeletal: Negative.  Negative for back pain.  Skin:  Negative for rash.  Neurological: Negative.  Negative for dizziness, focal weakness, weakness and headaches.  Endo/Heme/Allergies:  Does not bruise/bleed easily.  Psychiatric/Behavioral: Negative.  Negative for depression. The patient is not  nervous/anxious.     As per HPI. Otherwise, a complete review of systems is negative.  PAST MEDICAL HISTORY: Past Medical History:  Diagnosis Date   Asthma    COPD (chronic obstructive pulmonary disease) (HCC)    Hearing loss    Hypertension    Hypothyroidism    Mass of left lung    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HCC)    Prostate enlargement    Shortness of breath    Squamous cell carcinoma of lung, left (HCC)    Thyroid  disease    Tobacco abuse     PAST SURGICAL HISTORY: Past Surgical History:  Procedure Laterality Date   CARDIOVERSION N/A    CARDIOVERSION N/A 11/24/2021   Procedure: CARDIOVERSION;  Surgeon: Mady Bruckner, MD;  Location: [facility] ORS;  Service: Cardiovascular;  Laterality: N/A;   ELECTROMAGNETIC NAVIGATION BROCHOSCOPY Left 10/19/2018   Procedure: ELECTROMAGNETIC NAVIGATION BRONCHOSCOPY LEFT;  Surgeon: Tamea Dedra CROME, MD;  Location: [facility] ORS;  Service: Cardiopulmonary;  Laterality: Left;   ENDOBRONCHIAL ULTRASOUND Left 10/19/2018   Procedure: ENDOBRONCHIAL ULTRASOUND LEFT;  Surgeon: Tamea Dedra CROME, MD;  Location: [facility] ORS;  Service: Cardiopulmonary;  Laterality: Left;   IR IMAGING GUIDED PORT INSERTION  03/03/2023   IR THORACENTESIS ASP PLEURAL SPACE W/IMG GUIDE  03/01/2023   MELANOMA EXCISION Left    NO PAST SURGERIES      FAMILY HISTORY: Family History  Problem Relation Age of Onset   Aneurysm Mother    Cancer Father     ADVANCED DIRECTIVES (Y/N):  N  HEALTH MAINTENANCE: Social History   Tobacco Use   Smoking status: Former    Current packs/day: 0.00    Average packs/day: 0.3 packs/day for 62.0 years (15.5 ttl pk-yrs)    Types: Cigarettes  Start date: 09/04/1956    Quit date: 09/05/2018    Years since quitting: 5.1   Smokeless tobacco: Never  Vaping Use   Vaping status: Never Used  Substance Use Topics   Alcohol use: No    Alcohol/week: 0.0 standard drinks of alcohol   Drug use: No     Colonoscopy:  PAP:  Bone density:  Lipid  panel:  No Known Allergies  Current Outpatient Medications  Medication Sig Dispense Refill   albuterol  (PROVENTIL  HFA;VENTOLIN  HFA) 108 (90 Base) MCG/ACT inhaler Inhale 2 puffs into the lungs every 6 (six) hours as needed for wheezing or shortness of breath. 1 Inhaler 0   albuterol  (PROVENTIL ) (2.5 MG/3ML) 0.083% nebulizer solution Take 2.5 mg by nebulization every 6 (six) hours as needed for wheezing or shortness of breath.     apixaban  (ELIQUIS ) 5 MG TABS tablet Take 1 tablet (5 mg total) by mouth 2 (two) times daily. 60 tablet 3   carboxymethylcellulose (REFRESH PLUS) 0.5 % SOLN 1 drop 4 (four) times daily as needed.     chlorpheniramine-HYDROcodone  (TUSSIONEX) 10-8 MG/5ML Take 5 mLs by mouth every 12 (twelve) hours as needed for cough. 115 mL 0   finasteride (PROSCAR) 5 MG tablet Take 5 mg by mouth daily.     fluticasone-salmeterol (ADVAIR) 250-50 MCG/ACT AEPB Inhale 1 puff into the lungs in the morning and at bedtime.     ipratropium-albuterol  (DUONEB) 0.5-2.5 (3) MG/3ML SOLN Take 3 mLs by nebulization every 6 (six) hours as needed. 360 mL 3   levothyroxine  (SYNTHROID ) 200 MCG tablet Take 200 mcg by mouth daily before breakfast.     Magnesium  Glycinate 120 MG CAPS Take 400 mg by mouth.     Oxycodone  HCl 10 MG TABS Take 1 tablet (10 mg total) by mouth every 6 (six) hours as needed. Okay to cut tablet in half if needed. 60 tablet 0   OXYGEN Inhale 3 L into the lungs continuous.     potassium chloride  SA (KLOR-CON  M) 20 MEQ tablet TAKE 1 TABLET BY MOUTH TWICE A DAY 180 tablet 1   prochlorperazine  (COMPAZINE ) 10 MG tablet Take 10 mg by mouth every 6 (six) hours as needed.     SEMAGLUTIDE,0.25 OR 0.5MG /DOS, [place] Inject 0.5 mg into the skin once a week.     tamsulosin  (FLOMAX ) 0.4 MG CAPS capsule 0.8 mg daily.     Tiotropium Bromide Monohydrate  (SPIRIVA  RESPIMAT) 2.5 MCG/ACT AERS Inhale 2 puffs into the lungs daily. 1 each 11   torsemide  (DEMADEX ) 20 MG tablet Take 1 tablet (20 mg total) by mouth  daily. 30 tablet 0   vitamin B-12 (CYANOCOBALAMIN ) 1000 MCG tablet Take 1,000 mcg by mouth daily.     glimepiride  (AMARYL ) 2 MG tablet Take 2 mg by mouth daily with breakfast. (Patient not taking: Reported on 10/30/2023)     No current facility-administered medications for this visit.   Facility-Administered Medications Ordered in Other Visits  Medication Dose Route Frequency Provider Last Rate Last Admin   0.9 %  sodium chloride  infusion   Intravenous Continuous Iria Jamerson J, MD 10 mL/hr at 10/30/23 1102 New Bag at 10/30/23 1102   heparin  lock flush 100 unit/mL  500 Units Intracatheter Once PRN Addisson Frate J, MD       lurbinectedin  (ZEPZELCA ) 7.65 mg in sodium chloride  0.9 % 250 mL chemo infusion  3.2 mg/m2 (Treatment Plan Recorded) Intravenous Once Laverne Hursey J, MD 265 mL/hr at 10/30/23 1136 7.65 mg at 10/30/23 1136  OBJECTIVE: Vitals:   10/30/23 0951  BP: (!) 150/77  Pulse: (!) 57  Resp: 18  Temp: 98.4 F (36.9 C)  SpO2: 100%      Body mass index is 33.63 kg/m.    ECOG FS:1 - Symptomatic but completely ambulatory  General: Well-developed, well-nourished, no acute distress. Eyes: Pink conjunctiva, anicteric sclera. HEENT: Normocephalic, moist mucous membranes. Lungs: No audible wheezing or coughing. Heart: Regular rate and rhythm. Abdomen: Soft, nontender, no obvious distention. Musculoskeletal: No edema, cyanosis, or clubbing. Neuro: Alert, answering all questions appropriately. Cranial nerves grossly intact. Skin: No rashes or petechiae noted. Psych: Normal affect.  LAB RESULTS:  Lab Results  Component Value Date   NA 134 (L) 10/30/2023   K 3.7 10/30/2023   CL 104 10/30/2023   CO2 21 (L) 10/30/2023   GLUCOSE 103 (H) 10/30/2023   BUN 14 10/30/2023   CREATININE 1.34 (H) 10/30/2023   CALCIUM 8.6 (L) 10/30/2023   PROT 6.4 (L) 10/30/2023   ALBUMIN 3.3 (L) 10/30/2023   AST 21 10/30/2023   ALT 11 10/30/2023   ALKPHOS 79 10/30/2023   BILITOT 0.6  10/30/2023   GFRNONAA 53 (L) 10/30/2023   GFRAA 58 (L) 07/18/2019    Lab Results  Component Value Date   WBC 5.4 10/30/2023   NEUTROABS 3.0 10/30/2023   HGB 12.2 (L) 10/30/2023   HCT 37.7 (L) 10/30/2023   MCV 90.0 10/30/2023   PLT 251 10/30/2023     STUDIES: ECHOCARDIOGRAM COMPLETE Result Date: 10/11/2023    ECHOCARDIOGRAM REPORT   Patient Name:   LAMONDRE WESCHE Date of Exam: 10/11/2023 Medical Rec #:  [id number]        Height:       72.0 in Accession #:    [id number]       Weight:       242.0 lb Date of Birth:  06-29-1940        BSA:          2.310 m Patient Age:    83 years         BP:           121/65 mmHg Patient Gender: M                HR:           78 bpm. Exam Location:  [place] Procedure: 2D Echo, Cardiac Doppler and Color Doppler (Both Spectral and Color            Flow Doppler were utilized during procedure). Indications:    I48.0 Paroxysmal atrial fibrillation  History:        Patient has no prior history of Echocardiogram examinations.                 CAD, COPD, Arrythmias:Atrial Fibrillation; Risk                 Factors:Diabetes.  Sonographer:    Alm Minerva Referring Phys: [id number] BRIAN AGBOR-ETANG IMPRESSIONS  1. Left ventricular ejection fraction, by estimation, is 55 to 60%. Left ventricular ejection fraction by 2D MOD biplane is 55.4 %. The left ventricle has normal function. The left ventricle has no regional wall motion abnormalities. Left ventricular diastolic parameters are indeterminate.  2. Right ventricular systolic function is normal. The right ventricular size is mildly enlarged.  3. The mitral valve is normal in structure. No evidence of mitral valve regurgitation.  4. The aortic valve is tricuspid. Aortic valve regurgitation is not visualized. Aortic valve sclerosis/calcification is  present, without any evidence of aortic stenosis. FINDINGS  Left Ventricle: Left ventricular ejection fraction, by estimation, is 55 to 60%. Left ventricular ejection fraction by 2D  MOD biplane is 55.4 %. The left ventricle has normal function. The left ventricle has no regional wall motion abnormalities. The left ventricular internal cavity size was normal in size. There is no left ventricular hypertrophy. Left ventricular diastolic parameters are indeterminate. Right Ventricle: The right ventricular size is mildly enlarged. No increase in right ventricular wall thickness. Right ventricular systolic function is normal. Left Atrium: Left atrial size was normal in size. Right Atrium: Right atrial size was normal in size. Pericardium: There is no evidence of pericardial effusion. Mitral Valve: The mitral valve is normal in structure. No evidence of mitral valve regurgitation. Tricuspid Valve: The tricuspid valve is normal in structure. Tricuspid valve regurgitation is not demonstrated. Aortic Valve: The aortic valve is tricuspid. Aortic valve regurgitation is not visualized. Aortic valve sclerosis/calcification is present, without any evidence of aortic stenosis. Aortic valve mean gradient measures 3.0 mmHg. Aortic valve peak gradient measures 5.8 mmHg. Aortic valve area, by VTI measures 3.38 cm. Pulmonic Valve: The pulmonic valve was normal in structure. Pulmonic valve regurgitation is not visualized. Aorta: The aortic root and ascending aorta are structurally normal, with no evidence of dilitation. Venous: The inferior vena cava was not well visualized. IAS/Shunts: No atrial level shunt detected by color flow Doppler.  LEFT VENTRICLE PLAX 2D                        Biplane EF (MOD) LVIDd:         4.50 cm         LV Biplane EF:   Left LVIDs:         3.10 cm                          ventricular LV PW:         1.20 cm                          ejection LV IVS:        1.20 cm                          fraction by LVOT diam:     2.60 cm                          2D MOD LV SV:         80                               biplane is LV SV Index:   35                               55.4 %. LVOT Area:     5.31  cm                                Diastology  LV e' lateral:   14.50 cm/s LV Volumes (MOD)               LV E/e' lateral: 8.8 LV vol d, MOD    91.0 ml A2C: LV vol d, MOD    71.0 ml A4C: LV vol s, MOD    39.5 ml A2C: LV vol s, MOD    32.7 ml A4C: LV SV MOD A2C:   51.5 ml LV SV MOD A4C:   71.0 ml LV SV MOD BP:    45.4 ml RIGHT VENTRICLE RV Basal diam:  5.30 cm RV Mid diam:    3.10 cm RV S prime:     12.50 cm/s TAPSE (M-mode): 1.5 cm LEFT ATRIUM              Index        RIGHT ATRIUM           Index LA Vol (A2C):   108.0 ml 46.75 ml/m  RA Area:     31.00 cm LA Vol (A4C):   87.8 ml  38.01 ml/m  RA Volume:   110.00 ml 47.62 ml/m LA Biplane Vol: 104.0 ml 45.02 ml/m  AORTIC VALVE AV Area (Vmax):    3.64 cm AV Area (Vmean):   3.57 cm AV Area (VTI):     3.38 cm AV Vmax:           120.00 cm/s AV Vmean:          80.200 cm/s AV VTI:            0.237 m AV Peak Grad:      5.8 mmHg AV Mean Grad:      3.0 mmHg LVOT Vmax:         82.30 cm/s LVOT Vmean:        54.000 cm/s LVOT VTI:          0.151 m LVOT/AV VTI ratio: 0.64  AORTA Ao Sinus diam: 3.40 cm Ao Asc diam:   3.40 cm MV E velocity: 128.00 cm/s                             SHUNTS                             Systemic VTI:  0.15 m                             Systemic Diam: 2.60 cm Redell Cave MD Electronically signed by Redell Cave MD Signature Date/Time: 10/11/2023/4:16:01 PM    Final       ASSESSMENT: 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  Patient completed cycle 4 of carboplatinum, etoposide , and Tecentriq  on May 19, 2023.  Repeat PET scan on June 07, 2023 with significant improvement of disease burden and patient continued with maintenance Tecentriq .  CT scan results from Sep 04, 2023 revealed progression of disease in both lungs and liver.  MRI of the brain on Sep 13, 2023  was negative for disease.  Hospice and end-of-life were briefly discussed, but patient wished to continue with treatment.  Tecentriq  has been discontinued.  Proceed with cycle 3 of lurbinectedin  today.  Return to clinic in 3 weeks for further  evaluation and consideration of cycle 4.  Will repeat imaging with CT scan prior to cycle 5.   Clinical stage IIB squamous cell carcinoma, left upper lobe lung:  Patient underwent biopsy with navigational bronchoscopy on October 19, 2018 confirming diagnosis.  Patient declined surgery and wished to pursue XRT along with concurrent chemotherapy.  Given his stage of disease he did not receive maintenance immunotherapy.  He completed cycle 7 of weekly carboplatinum and Taxol  on January 09, 2019 and then completed XRT on January 14, 2019.   History of melanoma: Unclear stage or depth, although by report was in situ.  Patient had Mohs surgery in fall 2019.  Previous lung biopsy consistent with squamous cell carcinoma. Anxiety/depression: Improved.  Continue current medications as prescribed.  Continue follow-up with primary care physician as well as psychiatry at the [facility]. Shortness of breath: Chronic and unchanged.  Patient's most recent thoracentesis on Sep 08, 2023 removed 1.4 L of fluid.  Continue supplemental oxygen as needed.  We previously discussed Pleurx catheter, but this does not appear necessary at this point. Appreciate pulmonary input. Cough: Patient does not complain of this today.   Pain: Patient does not complain of this today.  Continue oxycodone  as needed. Peripheral edema: Chronic and unchanged.  Patient has been instructed to increase his torsemide  to daily if his blood pressure remains above 110 systolic. Hyponatremia: Mild, monitor. Renal insufficiency: Creatinine mildly improved to 1.34. Anemia: Mild.  Patient's hemoglobin has trended down slightly to 12.2. Dizziness: Patient does not complain of this today.  Repeat MRI negative for metastatic  disease.   Patient expressed understanding and was in agreement with this plan. He also understands that He can call clinic at any time with any questions, concerns, or complaints.    Cancer Staging  Small cell lung cancer, left [facility]) Staging form: Lung, AJCC 8th Edition - Clinical stage from 02/24/2023: Stage IVA (cT4, cN2, cM1a) - Signed by [name] [name], MD on 02/24/2023  Squamous cell carcinoma lung, left (HCC) Staging form: Lung, AJCC 8th Edition - Clinical stage from 10/27/2018: Stage IIB (cT1c, cN1, cM0) - Signed by [name] [name], MD on 11/16/2018   [name] [name], MD   10/30/2023 12:07 PM

## 2023-10-30 NOTE — Progress Notes (Signed)
 Virtual Visit via Telephone Note  I connected with Levi [name] on 10/30/23 at  3:00 PM EDT by telephone and verified that I am speaking with the correct person using two identifiers.  Location: Patient: Home Provider: Clinic   I discussed the limitations, risks, security and privacy concerns of performing an evaluation and management service by telephone and the availability of in person appointments. I also discussed with the patient that there may be a patient responsible charge related to this service. The patient expressed understanding and agreed to proceed.   History of Present Illness: Levi [name] is a 83 y.o. 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 Patient was referred to palliative care to address goals manage ongoing symptoms.    Observations/Objective: I spoke with patient and wife by phone.   Patient reports he is doing reasonably well.  Denies significant changes or concerns.  He does endorse occasional nausea without vomiting.  However, he says that he takes the prochlorperazine  and it works well.  He is not currently utilizing the ondansetron .  Patient did not need any refills of medications today.  Assessment and Plan: Stage IVa extensive stage small cell lung cancer -appears to be doing well.  Now on lurbinectedin .  No significant symptomatic complaints or concerns today.  Will follow.   Follow Up Instructions: Follow-up telephone visit to 3 months   I discussed the assessment and treatment plan with the patient. The patient was provided an opportunity to ask questions and all were answered. The patient agreed with the plan and demonstrated an understanding of the instructions.   The patient was advised to call back or seek an  in-person evaluation if the symptoms worsen or if the condition fails to improve as anticipated.  I provided 10 minutes of non-face-to-face time during this encounter.   FONDA JONELLE MOWER, NP

## 2023-10-30 NOTE — Patient Instructions (Signed)
 [facility] - A DEPT OF [place]  Discharge Instructions: Thank you for choosing [facility] to provide your oncology and hematology care.  If you have a lab appointment with the Cancer Center, please go directly to the Cancer Center and check in at the registration area.  Wear comfortable clothing and clothing appropriate for easy access to any Portacath or PICC line.   We strive to give you quality time with your provider. You may need to reschedule your appointment if you arrive late (15 or more minutes).  Arriving late affects you and other patients whose appointments are after yours.  Also, if you miss three or more appointments without notifying the office, you may be dismissed from the clinic at the provider's discretion.      For prescription refill requests, have your pharmacy contact our office and allow 72 hours for refills to be completed.    Today you received the following chemotherapy and/or immunotherapy agents Lurbinectedin       To help prevent nausea and vomiting after your treatment, we encourage you to take your nausea medication as directed.  BELOW ARE SYMPTOMS THAT SHOULD BE REPORTED IMMEDIATELY: *FEVER GREATER THAN 100.4 F (38 C) OR HIGHER *CHILLS OR SWEATING *NAUSEA AND VOMITING THAT IS NOT CONTROLLED WITH YOUR NAUSEA MEDICATION *UNUSUAL SHORTNESS OF BREATH *UNUSUAL BRUISING OR BLEEDING *URINARY PROBLEMS (pain or burning when urinating, or frequent urination) *BOWEL PROBLEMS (unusual diarrhea, constipation, pain near the anus) TENDERNESS IN MOUTH AND THROAT WITH OR WITHOUT PRESENCE OF ULCERS (sore throat, sores in mouth, or a toothache) UNUSUAL RASH, SWELLING OR PAIN  UNUSUAL VAGINAL DISCHARGE OR ITCHING   Items with * indicate a potential emergency and should be followed up as soon as possible or go to the Emergency Department if any problems should occur.  Please show the CHEMOTHERAPY ALERT CARD or  IMMUNOTHERAPY ALERT CARD at check-in to the Emergency Department and triage nurse.  Should you have questions after your visit or need to cancel or reschedule your appointment, please contact [facility] - A DEPT OF [name]  [phone] and follow the prompts.  Office hours are 8:00 a.m. to 4:30 p.m. Monday - Friday. Please note that voicemails left after 4:00 p.m. may not be returned until the following business day.  We are closed weekends and major holidays. You have access to a nurse at all times for urgent questions. Please call the main number to the clinic [phone] and follow the prompts.  For any non-urgent questions, you may also contact your provider using MyChart. We now offer e-Visits for anyone 66 and older to request care online for non-urgent symptoms. For details visit [email or web address].   Also download the MyChart app! Go to the app store, search MyChart, open the app, select [place], and log in with your MyChart username and password.

## 2023-11-20 ENCOUNTER — Inpatient Hospital Stay

## 2023-11-20 ENCOUNTER — Inpatient Hospital Stay (HOSPITAL_BASED_OUTPATIENT_CLINIC_OR_DEPARTMENT_OTHER): Admitting: Nurse Practitioner

## 2023-11-20 ENCOUNTER — Encounter: Payer: Self-pay | Admitting: Nurse Practitioner

## 2023-11-20 ENCOUNTER — Telehealth: Payer: Self-pay | Admitting: Cardiology

## 2023-11-20 ENCOUNTER — Inpatient Hospital Stay: Attending: Oncology

## 2023-11-20 VITALS — BP 131/78 | HR 64 | Temp 98.8°F | Resp 16 | Ht 72.0 in | Wt 243.0 lb

## 2023-11-20 DIAGNOSIS — C3412 Malignant neoplasm of upper lobe, left bronchus or lung: Secondary | ICD-10-CM | POA: Insufficient documentation

## 2023-11-20 DIAGNOSIS — R799 Abnormal finding of blood chemistry, unspecified: Secondary | ICD-10-CM | POA: Diagnosis not present

## 2023-11-20 DIAGNOSIS — Z5111 Encounter for antineoplastic chemotherapy: Secondary | ICD-10-CM | POA: Diagnosis present

## 2023-11-20 DIAGNOSIS — C787 Secondary malignant neoplasm of liver and intrahepatic bile duct: Secondary | ICD-10-CM | POA: Insufficient documentation

## 2023-11-20 DIAGNOSIS — Z79630 Long term (current) use of alkylating agent: Secondary | ICD-10-CM | POA: Diagnosis not present

## 2023-11-20 DIAGNOSIS — C3492 Malignant neoplasm of unspecified part of left bronchus or lung: Secondary | ICD-10-CM

## 2023-11-20 LAB — CBC WITH DIFFERENTIAL (CANCER CENTER ONLY)
Abs Immature Granulocytes: 0.07 K/uL (ref 0.00–0.07)
Basophils Absolute: 0 K/uL (ref 0.0–0.1)
Basophils Relative: 1 %
Eosinophils Absolute: 0.1 K/uL (ref 0.0–0.5)
Eosinophils Relative: 2 %
HCT: 37.5 % — ABNORMAL LOW (ref 39.0–52.0)
Hemoglobin: 12.1 g/dL — ABNORMAL LOW (ref 13.0–17.0)
Immature Granulocytes: 2 %
Lymphocytes Relative: 23 %
Lymphs Abs: 1 K/uL (ref 0.7–4.0)
MCH: 29.7 pg (ref 26.0–34.0)
MCHC: 32.3 g/dL (ref 30.0–36.0)
MCV: 92.1 fL (ref 80.0–100.0)
Monocytes Absolute: 0.9 K/uL (ref 0.1–1.0)
Monocytes Relative: 22 %
Neutro Abs: 2.1 K/uL (ref 1.7–7.7)
Neutrophils Relative %: 50 %
Platelet Count: 260 K/uL (ref 150–400)
RBC: 4.07 MIL/uL — ABNORMAL LOW (ref 4.22–5.81)
RDW: 18.2 % — ABNORMAL HIGH (ref 11.5–15.5)
WBC Count: 4.2 K/uL (ref 4.0–10.5)
nRBC: 0 % (ref 0.0–0.2)

## 2023-11-20 LAB — CMP (CANCER CENTER ONLY)
ALT: 12 U/L (ref 0–44)
AST: 21 U/L (ref 15–41)
Albumin: 3.5 g/dL (ref 3.5–5.0)
Alkaline Phosphatase: 70 U/L (ref 38–126)
Anion gap: 8 (ref 5–15)
BUN: 25 mg/dL — ABNORMAL HIGH (ref 8–23)
CO2: 23 mmol/L (ref 22–32)
Calcium: 8.8 mg/dL — ABNORMAL LOW (ref 8.9–10.3)
Chloride: 105 mmol/L (ref 98–111)
Creatinine: 1.83 mg/dL — ABNORMAL HIGH (ref 0.61–1.24)
GFR, Estimated: 37 mL/min — ABNORMAL LOW (ref >60)
Glucose, Bld: 126 mg/dL — ABNORMAL HIGH (ref 70–99)
Potassium: 3.6 mmol/L (ref 3.5–5.1)
Sodium: 136 mmol/L (ref 135–145)
Total Bilirubin: 0.6 mg/dL (ref 0.0–1.2)
Total Protein: 6.6 g/dL (ref 6.5–8.1)

## 2023-11-20 LAB — MAGNESIUM: Magnesium: 1.7 mg/dL (ref 1.7–2.4)

## 2023-11-20 MED ORDER — SODIUM CHLORIDE 0.9% FLUSH
10.0000 mL | INTRAVENOUS | Status: DC | PRN
  Administered 2023-11-20: 10 mL
  Filled 2023-11-20: qty 10

## 2023-11-20 MED ORDER — SODIUM CHLORIDE 0.9 % IV SOLN
3.2000 mg/m2 | Freq: Once | INTRAVENOUS | Status: AC
  Administered 2023-11-20: 7.65 mg via INTRAVENOUS
  Filled 2023-11-20: qty 15.3

## 2023-11-20 MED ORDER — HEPARIN SOD (PORK) LOCK FLUSH 100 UNIT/ML IV SOLN
500.0000 [IU] | Freq: Once | INTRAVENOUS | Status: AC | PRN
Start: 2023-11-20 — End: 2023-11-20
  Administered 2023-11-20: 500 [IU]
  Filled 2023-11-20: qty 5

## 2023-11-20 MED ORDER — DEXAMETHASONE SODIUM PHOSPHATE 10 MG/ML IJ SOLN
10.0000 mg | Freq: Once | INTRAMUSCULAR | Status: AC
  Administered 2023-11-20: 10 mg via INTRAVENOUS
  Filled 2023-11-20: qty 1

## 2023-11-20 MED ORDER — SODIUM CHLORIDE 0.9 % IV SOLN
INTRAVENOUS | Status: DC
  Filled 2023-11-20: qty 250

## 2023-11-20 MED ORDER — PALONOSETRON HCL INJECTION 0.25 MG/5ML
0.2500 mg | Freq: Once | INTRAVENOUS | Status: AC
  Administered 2023-11-20: 0.25 mg via INTRAVENOUS
  Filled 2023-11-20: qty 5

## 2023-11-20 NOTE — Patient Instructions (Signed)
 Hold torsemide  for one week.

## 2023-11-20 NOTE — Progress Notes (Signed)
 Patient is doing ok, he is still having some pain in his left side when he gets to coughing a lot, pain rates fluctuates. He is having some nausea, but the nausea medication helps with that.

## 2023-11-20 NOTE — Patient Instructions (Signed)
 [facility] - A DEPT OF [facility]  Discharge Instructions: Thank you for choosing [facility] to provide your oncology and hematology care.  If you have a lab appointment with the Cancer Center, please go directly to the Cancer Center and check in at the registration area.  Wear comfortable clothing and clothing appropriate for easy access to any Portacath or PICC line.   We strive to give you quality time with your provider. You may need to reschedule your appointment if you arrive late (15 or more minutes).  Arriving late affects you and other patients whose appointments are after yours.  Also, if you miss three or more appointments without notifying the office, you may be dismissed from the clinic at the provider's discretion.      For prescription refill requests, have your pharmacy contact our office and allow 72 hours for refills to be completed.    Today you received the following chemotherapy and/or immunotherapy agents lurbinectedin       To help prevent nausea and vomiting after your treatment, we encourage you to take your nausea medication as directed.  BELOW ARE SYMPTOMS THAT SHOULD BE REPORTED IMMEDIATELY: *FEVER GREATER THAN 100.4 F (38 C) OR HIGHER *CHILLS OR SWEATING *NAUSEA AND VOMITING THAT IS NOT CONTROLLED WITH YOUR NAUSEA MEDICATION *UNUSUAL SHORTNESS OF BREATH *UNUSUAL BRUISING OR BLEEDING *URINARY PROBLEMS (pain or burning when urinating, or frequent urination) *BOWEL PROBLEMS (unusual diarrhea, constipation, pain near the anus) TENDERNESS IN MOUTH AND THROAT WITH OR WITHOUT PRESENCE OF ULCERS (sore throat, sores in mouth, or a toothache) UNUSUAL RASH, SWELLING OR PAIN  UNUSUAL VAGINAL DISCHARGE OR ITCHING   Items with * indicate a potential emergency and should be followed up as soon as possible or go to the Emergency Department if any problems should occur.  Please show the CHEMOTHERAPY ALERT CARD or  IMMUNOTHERAPY ALERT CARD at check-in to the Emergency Department and triage nurse.  Should you have questions after your visit or need to cancel or reschedule your appointment, please contact [facility] - A DEPT OF [facility]  [phone] and follow the prompts.  Office hours are 8:00 a.m. to 4:30 p.m. Monday - Friday. Please note that voicemails left after 4:00 p.m. may not be returned until the following business day.  We are closed weekends and major holidays. You have access to a nurse at all times for urgent questions. Please call the main number to the clinic [phone] and follow the prompts.  For any non-urgent questions, you may also contact your provider using MyChart. We now offer e-Visits for anyone 29 and older to request care online for non-urgent symptoms. For details visit [email or web address].   Also download the MyChart app! Go to the app store, search "MyChart", open the app, select [place], and log in with your MyChart username and password.

## 2023-11-20 NOTE — Progress Notes (Signed)
 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  INTERVAL HISTORY: Patient returns to clinic today for further evaluation and consideration of cycle 4 of Lurbinectedin . Currently feels well and is asymptomatic.  He is tolerating his treatments well without significant side effects.  He has chronic shortness of breath requiring oxygen.  He has no dizziness or other neurologic complaints today.  He denies any chest pain, cough, or hemoptysis.  He has a good appetite and denies weight loss.  He denies any nausea, vomiting, constipation, or diarrhea.  He has no urinary complaints.  Patient offers no further specific complaints today.  REVIEW OF SYSTEMS:   Review of Systems  Constitutional: Negative.  Negative for fever, malaise/fatigue and weight loss.  HENT: Negative.  Negative for congestion.   Respiratory:  Positive for shortness of breath. Negative for cough and hemoptysis.   Cardiovascular: Negative.  Negative for chest pain and leg swelling.  Gastrointestinal: Negative.  Negative for abdominal pain and nausea.  Genitourinary: Negative.  Negative for dysuria.  Musculoskeletal: Negative.  Negative for back pain.  Skin:  Negative for rash.  Neurological: Negative.  Negative for dizziness, focal weakness, weakness and headaches.  Endo/Heme/Allergies:  Does not bruise/bleed easily.  Psychiatric/Behavioral: Negative.  Negative for depression. The patient is not  nervous/anxious.   As per HPI. Otherwise, a complete review of systems is negative.  PAST MEDICAL HISTORY: Past Medical History:  Diagnosis Date   Asthma    COPD (chronic obstructive pulmonary disease) (HCC)    Hearing loss    Hypertension    Hypothyroidism    Mass of left lung    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HCC)    Prostate enlargement    Shortness of breath    Squamous cell carcinoma of lung, left (HCC)    Thyroid  disease    Tobacco abuse    PAST SURGICAL HISTORY: Past Surgical History:  Procedure Laterality Date   CARDIOVERSION N/A    CARDIOVERSION N/A 11/24/2021   Procedure: CARDIOVERSION;  Surgeon: Mady Bruckner, MD;  Location: [facility] ORS;  Service: Cardiovascular;  Laterality: N/A;   ELECTROMAGNETIC NAVIGATION BROCHOSCOPY Left 10/19/2018   Procedure: ELECTROMAGNETIC NAVIGATION BRONCHOSCOPY LEFT;  Surgeon: Tamea Dedra CROME, MD;  Location: [facility] ORS;  Service: Cardiopulmonary;  Laterality: Left;   ENDOBRONCHIAL ULTRASOUND Left 10/19/2018   Procedure: ENDOBRONCHIAL ULTRASOUND LEFT;  Surgeon: Tamea Dedra CROME, MD;  Location: [facility] ORS;  Service: Cardiopulmonary;  Laterality: Left;   IR IMAGING GUIDED PORT INSERTION  03/03/2023   IR THORACENTESIS ASP PLEURAL SPACE W/IMG GUIDE  03/01/2023   MELANOMA EXCISION Left    NO PAST SURGERIES     FAMILY HISTORY: Family History  Problem Relation Age of Onset   Aneurysm Mother    Cancer Father    ADVANCED DIRECTIVES (Y/N):  N  HEALTH MAINTENANCE: Social History   Tobacco Use   Smoking status: Former    Current packs/day: 0.00    Average packs/day: 0.3 packs/day for 62.0 years (15.5 ttl pk-yrs)    Types: Cigarettes    Start date: 09/04/1956  Quit date: 09/05/2018    Years since quitting: 5.2   Smokeless tobacco: Never  Vaping Use   Vaping status: Never Used  Substance Use Topics   Alcohol use: No    Alcohol/week: 0.0 standard drinks of alcohol   Drug use: No    Colonoscopy:  PAP:  Bone density:  Lipid panel:  No  Known Allergies  Current Outpatient Medications  Medication Sig Dispense Refill   albuterol  (PROVENTIL  HFA;VENTOLIN  HFA) 108 (90 Base) MCG/ACT inhaler Inhale 2 puffs into the lungs every 6 (six) hours as needed for wheezing or shortness of breath. 1 Inhaler 0   albuterol  (PROVENTIL ) (2.5 MG/3ML) 0.083% nebulizer solution Take 2.5 mg by nebulization every 6 (six) hours as needed for wheezing or shortness of breath.     apixaban  (ELIQUIS ) 5 MG TABS tablet Take 1 tablet (5 mg total) by mouth 2 (two) times daily. 60 tablet 3   carboxymethylcellulose (REFRESH PLUS) 0.5 % SOLN 1 drop 4 (four) times daily as needed.     chlorpheniramine-HYDROcodone  (TUSSIONEX) 10-8 MG/5ML Take 5 mLs by mouth every 12 (twelve) hours as needed for cough. 115 mL 0   finasteride (PROSCAR) 5 MG tablet Take 5 mg by mouth daily.     fluticasone-salmeterol (ADVAIR) 250-50 MCG/ACT AEPB Inhale 1 puff into the lungs in the morning and at bedtime.     glimepiride  (AMARYL ) 2 MG tablet Take 2 mg by mouth daily with breakfast.     ipratropium-albuterol  (DUONEB) 0.5-2.5 (3) MG/3ML SOLN Take 3 mLs by nebulization every 6 (six) hours as needed. 360 mL 3   levothyroxine  (SYNTHROID ) 200 MCG tablet Take 200 mcg by mouth daily before breakfast.     Magnesium  Glycinate 120 MG CAPS Take 400 mg by mouth.     Oxycodone  HCl 10 MG TABS Take 1 tablet (10 mg total) by mouth every 6 (six) hours as needed. Okay to cut tablet in half if needed. 60 tablet 0   OXYGEN Inhale 3 L into the lungs continuous.     potassium chloride  SA (KLOR-CON  M) 20 MEQ tablet TAKE 1 TABLET BY MOUTH TWICE A DAY 180 tablet 1   prochlorperazine  (COMPAZINE ) 10 MG tablet Take 10 mg by mouth every 6 (six) hours as needed.     SEMAGLUTIDE,0.25 OR 0.5MG /DOS, [place] Inject 0.5 mg into the skin once a week.     tamsulosin  (FLOMAX ) 0.4 MG CAPS capsule 0.8 mg daily.     Tiotropium Bromide Monohydrate  (SPIRIVA  RESPIMAT) 2.5 MCG/ACT AERS Inhale 2 puffs into the lungs daily. 1 each 11    torsemide  (DEMADEX ) 20 MG tablet Take 1 tablet (20 mg total) by mouth daily. 30 tablet 0   vitamin B-12 (CYANOCOBALAMIN ) 1000 MCG tablet Take 1,000 mcg by mouth daily.     No current facility-administered medications for this visit.   OBJECTIVE: Vitals:   11/20/23 0857  BP: 131/78  Pulse: 64  Resp: 16  Temp: 98.8 F (37.1 C)  SpO2: 100%   Body mass index is 32.96 kg/m.    ECOG FS:1 - Symptomatic but completely ambulatory  General: Well-developed, well-nourished, no acute distress. Eyes: Pink conjunctiva, anicteric sclera. HEENT: Normocephalic, moist mucous membranes. Lungs: No audible wheezing or coughing. Heart: Regular rate and rhythm. Abdomen: Soft, nontender, no obvious distention. Musculoskeletal: No edema, cyanosis, or clubbing. Neuro: Alert, answering all questions appropriately. Cranial nerves grossly intact. Skin: No rashes or petechiae noted. Psych: Normal affect.  LAB RESULTS: Lab Results  Component Value Date   NA 136 11/20/2023  K 3.6 11/20/2023   CL 105 11/20/2023   CO2 23 11/20/2023   GLUCOSE 126 (H) 11/20/2023   BUN 25 (H) 11/20/2023   CREATININE 1.83 (H) 11/20/2023   CALCIUM 8.8 (L) 11/20/2023   PROT 6.6 11/20/2023   ALBUMIN 3.5 11/20/2023   AST 21 11/20/2023   ALT 12 11/20/2023   ALKPHOS 70 11/20/2023   BILITOT 0.6 11/20/2023   GFRNONAA 37 (L) 11/20/2023   GFRAA 58 (L) 07/18/2019    Lab Results  Component Value Date   WBC 4.2 11/20/2023   NEUTROABS 2.1 11/20/2023   HGB 12.1 (L) 11/20/2023   HCT 37.5 (L) 11/20/2023   MCV 92.1 11/20/2023   PLT 260 11/20/2023     STUDIES: No results found.  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  Patient completed cycle 4 of carboplatinum, etoposide , and Tecentriq  on May 19, 2023.  Repeat PET scan on June 07, 2023 with significant improvement  of disease burden and patient continued with maintenance Tecentriq .  CT scan results from Sep 04, 2023 revealed progression of disease in both lungs and liver.  MRI of the brain on Sep 13, 2023 was negative for disease.  Hospice and end-of-life were briefly discussed, but patient wished to continue with treatment.  Tecentriq  has been discontinued.  Proceed with cycle 4 of lurbinectedin  today. Plan for imaging in interim then return to clinic in 3 weeks for further evaluation and consideration of cycle 5.   Clinical stage IIB squamous cell carcinoma, left upper lobe lung:  Patient underwent biopsy with navigational bronchoscopy on October 19, 2018 confirming diagnosis.  Patient declined surgery and wished to pursue XRT along with concurrent chemotherapy.  Given his stage of disease he did not receive maintenance immunotherapy.  He completed cycle 7 of weekly carboplatinum and Taxol  on January 09, 2019 and then completed XRT on January 14, 2019.   History of melanoma: Unclear stage or depth, although by report was in situ.  Patient had Mohs surgery in fall 2019.  Previous lung biopsy consistent with squamous cell carcinoma. Anxiety/depression: Improved.  Continue current medications as prescribed.  Continue follow-up with primary care physician as well as psychiatry at the [facility]. Shortness of breath: Chronic and unchanged.  Patient's most recent thoracentesis on Sep 08, 2023 removed 1.4 L of fluid.  Continue supplemental oxygen as needed.  We previously discussed Pleurx catheter, but this does not appear necessary at this point. Appreciate pulmonary input. Cough: Patient does not complain of this today.   Pain: Patient does not complain of this today.  Continue oxycodone  as needed. Peripheral edema: Chronic and unchanged.  Patient has been instructed to increase his torsemide  to daily if his blood pressure remains above 110 systolic. Hyponatremia: Mild, monitor. Renal insufficiency: Creatinine mildly improved to  1.34. Anemia: Mild.  Patient's hemoglobin has trended down slightly to 12.2. Dizziness: Patient does not complain of this today.  Repeat MRI negative for metastatic disease.  Disposition:  lurbinectedin  today 1 week- lab only 3 weeks- port/lab, Dr [name], +/- lurbinectedin - [place]  Patient expressed understanding and was in agreement with this plan. He also understands that He can call clinic at any time with any questions, concerns, or complaints.    Cancer Staging  Small cell lung cancer, left (HCC) Staging form: Lung, AJCC 8th Edition - Clinical stage from 02/24/2023: Stage IVA (cT4, cN2, cM1a) -  Signed by [name] [name], MD on 02/24/2023  Squamous cell carcinoma lung, left [facility] Staging form: Lung, AJCC 8th Edition - Clinical stage from 10/27/2018: Stage IIB (cT1c, cN1, cM0) - Signed by [name] [name], MD on 11/16/2018  Tinnie KANDICE Dawn, NP 11/20/2023

## 2023-11-20 NOTE — Telephone Encounter (Signed)
 Pt's wife dropped off records from the [facility]. Placed in Dr. Dany nurse box.

## 2023-11-20 NOTE — Progress Notes (Signed)
 OK per NP to proceed with crt 1.83, patient will hold torsemide 

## 2023-11-27 ENCOUNTER — Other Ambulatory Visit

## 2023-11-28 ENCOUNTER — Inpatient Hospital Stay

## 2023-11-28 DIAGNOSIS — Z5111 Encounter for antineoplastic chemotherapy: Secondary | ICD-10-CM | POA: Diagnosis not present

## 2023-11-28 DIAGNOSIS — C3492 Malignant neoplasm of unspecified part of left bronchus or lung: Secondary | ICD-10-CM

## 2023-11-28 DIAGNOSIS — R051 Acute cough: Secondary | ICD-10-CM

## 2023-11-28 LAB — CBC WITH DIFFERENTIAL/PLATELET
Abs Immature Granulocytes: 0.05 10*3/uL (ref 0.00–0.07)
Basophils Absolute: 0 10*3/uL (ref 0.0–0.1)
Basophils Relative: 1 %
Eosinophils Absolute: 0 10*3/uL (ref 0.0–0.5)
Eosinophils Relative: 1 %
HCT: 34.3 % — ABNORMAL LOW (ref 39.0–52.0)
Hemoglobin: 11.3 g/dL — ABNORMAL LOW (ref 13.0–17.0)
Immature Granulocytes: 2 %
Lymphocytes Relative: 18 %
Lymphs Abs: 0.6 10*3/uL — ABNORMAL LOW (ref 0.7–4.0)
MCH: 30.4 pg (ref 26.0–34.0)
MCHC: 32.9 g/dL (ref 30.0–36.0)
MCV: 92.2 fL (ref 80.0–100.0)
Monocytes Absolute: 0.2 10*3/uL (ref 0.1–1.0)
Monocytes Relative: 5 %
Neutro Abs: 2.4 10*3/uL (ref 1.7–7.7)
Neutrophils Relative %: 73 %
Platelets: 92 10*3/uL — ABNORMAL LOW (ref 150–400)
RBC: 3.72 MIL/uL — ABNORMAL LOW (ref 4.22–5.81)
RDW: 17.5 % — ABNORMAL HIGH (ref 11.5–15.5)
WBC: 3.3 10*3/uL — ABNORMAL LOW (ref 4.0–10.5)
nRBC: 0 % (ref 0.0–0.2)

## 2023-11-28 LAB — COMPREHENSIVE METABOLIC PANEL WITH GFR
ALT: 16 U/L (ref 0–44)
AST: 20 U/L (ref 15–41)
Albumin: 3 g/dL — ABNORMAL LOW (ref 3.5–5.0)
Alkaline Phosphatase: 67 U/L (ref 38–126)
Anion gap: 6 (ref 5–15)
BUN: 14 mg/dL (ref 8–23)
CO2: 22 mmol/L (ref 22–32)
Calcium: 8.3 mg/dL — ABNORMAL LOW (ref 8.9–10.3)
Chloride: 105 mmol/L (ref 98–111)
Creatinine, Ser: 1.46 mg/dL — ABNORMAL HIGH (ref 0.61–1.24)
GFR, Estimated: 48 mL/min — ABNORMAL LOW
Glucose, Bld: 113 mg/dL — ABNORMAL HIGH (ref 70–99)
Potassium: 4.5 mmol/L (ref 3.5–5.1)
Sodium: 133 mmol/L — ABNORMAL LOW (ref 135–145)
Total Bilirubin: 0.7 mg/dL (ref 0.0–1.2)
Total Protein: 6 g/dL — ABNORMAL LOW (ref 6.5–8.1)

## 2023-11-28 LAB — MAGNESIUM: Magnesium: 2 mg/dL (ref 1.7–2.4)

## 2023-12-05 ENCOUNTER — Ambulatory Visit
Admission: RE | Admit: 2023-12-05 | Discharge: 2023-12-05 | Disposition: A | Discharge status: Home / Self Care | Source: Ambulatory Visit | Attending: Oncology | Admitting: Oncology

## 2023-12-05 DIAGNOSIS — C3492 Malignant neoplasm of unspecified part of left bronchus or lung: Secondary | ICD-10-CM | POA: Diagnosis present

## 2023-12-05 MED ORDER — IOHEXOL 300 MG/ML  SOLN
100.0000 mL | Freq: Once | INTRAMUSCULAR | Status: AC | PRN
  Administered 2023-12-05: 100 mL via INTRAVENOUS

## 2023-12-06 ENCOUNTER — Encounter: Payer: Self-pay | Admitting: Oncology

## 2023-12-11 ENCOUNTER — Inpatient Hospital Stay (HOSPITAL_BASED_OUTPATIENT_CLINIC_OR_DEPARTMENT_OTHER): Admitting: Oncology

## 2023-12-11 ENCOUNTER — Inpatient Hospital Stay: Attending: Oncology

## 2023-12-11 ENCOUNTER — Encounter: Payer: Self-pay | Admitting: Oncology

## 2023-12-11 ENCOUNTER — Inpatient Hospital Stay

## 2023-12-11 VITALS — BP 141/71 | HR 60 | Resp 17

## 2023-12-11 VITALS — BP 118/65 | HR 60 | Temp 97.0°F | Resp 16 | Wt 249.5 lb

## 2023-12-11 DIAGNOSIS — Z5111 Encounter for antineoplastic chemotherapy: Secondary | ICD-10-CM | POA: Diagnosis present

## 2023-12-11 DIAGNOSIS — C787 Secondary malignant neoplasm of liver and intrahepatic bile duct: Secondary | ICD-10-CM | POA: Diagnosis present

## 2023-12-11 DIAGNOSIS — Z79630 Long term (current) use of alkylating agent: Secondary | ICD-10-CM | POA: Diagnosis not present

## 2023-12-11 DIAGNOSIS — C3492 Malignant neoplasm of unspecified part of left bronchus or lung: Secondary | ICD-10-CM

## 2023-12-11 DIAGNOSIS — C3412 Malignant neoplasm of upper lobe, left bronchus or lung: Secondary | ICD-10-CM | POA: Insufficient documentation

## 2023-12-11 LAB — CMP (CANCER CENTER ONLY)
ALT: 13 U/L (ref 0–44)
AST: 25 U/L (ref 15–41)
Albumin: 2.9 g/dL — ABNORMAL LOW (ref 3.5–5.0)
Alkaline Phosphatase: 72 U/L (ref 38–126)
Anion gap: 11 (ref 5–15)
BUN: 14 mg/dL (ref 8–23)
CO2: 21 mmol/L — ABNORMAL LOW (ref 22–32)
Calcium: 8.5 mg/dL — ABNORMAL LOW (ref 8.9–10.3)
Chloride: 91 mmol/L — ABNORMAL LOW (ref 98–111)
Creatinine: 1.32 mg/dL — ABNORMAL HIGH (ref 0.61–1.24)
GFR, Estimated: 54 mL/min — ABNORMAL LOW (ref >60)
Glucose, Bld: 110 mg/dL — ABNORMAL HIGH (ref 70–99)
Potassium: 3.5 mmol/L (ref 3.5–5.1)
Sodium: 123 mmol/L — ABNORMAL LOW (ref 135–145)
Total Bilirubin: 0.4 mg/dL (ref 0.0–1.2)
Total Protein: 5.9 g/dL — ABNORMAL LOW (ref 6.5–8.1)

## 2023-12-11 LAB — CBC WITH DIFFERENTIAL (CANCER CENTER ONLY)
Abs Immature Granulocytes: 0.14 K/uL — ABNORMAL HIGH (ref 0.00–0.07)
Basophils Absolute: 0 K/uL (ref 0.0–0.1)
Basophils Relative: 1 %
Eosinophils Absolute: 0.1 K/uL (ref 0.0–0.5)
Eosinophils Relative: 1 %
HCT: 33.9 % — ABNORMAL LOW (ref 39.0–52.0)
Hemoglobin: 11.5 g/dL — ABNORMAL LOW (ref 13.0–17.0)
Immature Granulocytes: 3 %
Lymphocytes Relative: 14 %
Lymphs Abs: 0.8 K/uL (ref 0.7–4.0)
MCH: 30.8 pg (ref 26.0–34.0)
MCHC: 33.9 g/dL (ref 30.0–36.0)
MCV: 90.9 fL (ref 80.0–100.0)
Monocytes Absolute: 0.9 K/uL (ref 0.1–1.0)
Monocytes Relative: 16 %
Neutro Abs: 3.7 K/uL (ref 1.7–7.7)
Neutrophils Relative %: 65 %
Platelet Count: 251 K/uL (ref 150–400)
RBC: 3.73 MIL/uL — ABNORMAL LOW (ref 4.22–5.81)
RDW: 18.2 % — ABNORMAL HIGH (ref 11.5–15.5)
WBC Count: 5.6 K/uL (ref 4.0–10.5)
nRBC: 0 % (ref 0.0–0.2)

## 2023-12-11 LAB — MAGNESIUM: Magnesium: 1.5 mg/dL — ABNORMAL LOW (ref 1.7–2.4)

## 2023-12-11 MED ORDER — PALONOSETRON HCL INJECTION 0.25 MG/5ML
0.2500 mg | Freq: Once | INTRAVENOUS | Status: AC
  Administered 2023-12-11 (×2): 0.25 mg via INTRAVENOUS
  Filled 2023-12-11: qty 5

## 2023-12-11 MED ORDER — SODIUM CHLORIDE 0.9 % IV SOLN
INTRAVENOUS | Status: DC
  Filled 2023-12-11: qty 250

## 2023-12-11 MED ORDER — DEXAMETHASONE SODIUM PHOSPHATE 10 MG/ML IJ SOLN
10.0000 mg | Freq: Once | INTRAMUSCULAR | Status: AC
  Administered 2023-12-11 (×2): 10 mg via INTRAVENOUS
  Filled 2023-12-11: qty 1

## 2023-12-11 MED ORDER — SODIUM CHLORIDE 0.9 % IV SOLN
3.2000 mg/m2 | Freq: Once | INTRAVENOUS | Status: AC
  Administered 2023-12-11 (×2): 7.65 mg via INTRAVENOUS
  Filled 2023-12-11: qty 15.3

## 2023-12-11 NOTE — Progress Notes (Signed)
 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  INTERVAL HISTORY: Patient returns to clinic today for further evaluation, discussion of his imaging results, and consideration of cycle 4 of Lurbinectedin .  Over the past week he has had increased dizziness and nausea, but otherwise feels well.  He denies any fevers.  He has chronic shortness of breath requiring oxygen.  He has no other neurologic complaints.  He denies any chest pain, cough, or hemoptysis.  He has a good appetite and denies weight loss.  He denies any vomiting, constipation, or diarrhea.  He has no urinary complaints.  Patient offers no further specific complaints today.  REVIEW OF SYSTEMS:   Review of Systems  Constitutional: Negative.  Negative for fever, malaise/fatigue and weight loss.  HENT: Negative.  Negative for congestion.   Respiratory:  Positive for shortness of breath. Negative for cough and hemoptysis.   Cardiovascular: Negative.  Negative for chest pain and leg swelling.  Gastrointestinal:  Positive for nausea. Negative for abdominal pain.  Genitourinary: Negative.  Negative for dysuria.  Musculoskeletal: Negative.  Negative for back pain.  Skin:  Negative for rash.  Neurological:  Positive for dizziness and weakness. Negative for focal weakness and headaches.  Endo/Heme/Allergies:  Does not bruise/bleed easily.  Psychiatric/Behavioral: Negative.  Negative for depression. The patient is  not nervous/anxious.     As per HPI. Otherwise, a complete review of systems is negative.  PAST MEDICAL HISTORY: Past Medical History:  Diagnosis Date   Asthma    COPD (chronic obstructive pulmonary disease) (HCC)    Hearing loss    Hypertension    Hypothyroidism    Mass of left lung    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HCC)    Prostate enlargement    Shortness of breath    Squamous cell carcinoma of lung, left (HCC)    Thyroid  disease    Tobacco abuse     PAST SURGICAL HISTORY: Past Surgical History:  Procedure Laterality Date   CARDIOVERSION N/A    CARDIOVERSION N/A 11/24/2021   Procedure: CARDIOVERSION;  Surgeon: Levi Bruckner, MD;  Location: [facility] OR[facility];  Service: Cardiovascular;  Laterality: N/A;   ELECTROMAGNETIC NAVIGATION BROCHOSCOPY Left 10/19/2018   Procedure: ELECTROMAGNETIC NAVIGATION BRONCHOSCOPY LEFT;  Surgeon: Levi Dedra CROME, MD;  Location: [facility];  Service: Cardiopulmonary;  Laterality: Left;   ENDOBRONCHIAL ULTRASOUND Left 10/19/2018   Procedure: ENDOBRONCHIAL ULTRASOUND LEFT;  Surgeon: Levi Dedra CROME, MD;  Location: [facility];  Service: Cardiopulmonary;  Laterality: Left;   IR IMAGING GUIDED PORT INSERTION  03/03/2023   IR THORACENTESIS ASP PLEURAL SPACE W/IMG GUIDE  03/01/2023   MELANOMA EXCISION Left    NO PAST SURGERIES      FAMILY HISTORY: Family History  Problem Relation Age of Onset   Aneurysm Mother    Cancer Father     ADVANCED DIRECTIVES (Y/N):  N  HEALTH MAINTENANCE: Social History   Tobacco Use   Smoking status: Former    Current packs/day: 0.00    Average packs/day: 0.3 packs/day for 62.0 years (15.5 ttl pk-yrs)  Types: Cigarettes    Start date: 09/04/1956    Quit date: 09/05/2018    Years since quitting: 5.2   Smokeless tobacco: Never  Vaping Use   Vaping status: Never Used  Substance Use Topics   Alcohol use: No    Alcohol/week: 0.0 standard drinks of alcohol   Drug use: No     Colonoscopy:  PAP:  Bone density:  Lipid  panel:  No Known Allergies  Current Outpatient Medications  Medication Sig Dispense Refill   albuterol  (PROVENTIL  HFA;VENTOLIN  HFA) 108 (90 Base) MCG/ACT inhaler Inhale 2 puffs into the lungs every 6 (six) hours as needed for wheezing or shortness of breath. 1 Inhaler 0   albuterol  (PROVENTIL ) (2.5 MG/3ML) 0.083% nebulizer solution Take 2.5 mg by nebulization every 6 (six) hours as needed for wheezing or shortness of breath.     apixaban  (ELIQUIS ) 5 MG TABS tablet Take 1 tablet (5 mg total) by mouth 2 (two) times daily. 60 tablet 3   carboxymethylcellulose (REFRESH PLUS) 0.5 % SOLN 1 drop 4 (four) times daily as needed.     chlorpheniramine-HYDROcodone  (TUSSIONEX) 10-8 MG/5ML Take 5 mLs by mouth every 12 (twelve) hours as needed for cough. 115 mL 0   finasteride (PROSCAR) 5 MG tablet Take 5 mg by mouth daily.     fluticasone-salmeterol (ADVAIR) 250-50 MCG/ACT AEPB Inhale 1 puff into the lungs in the morning and at bedtime.     glimepiride  (AMARYL ) 2 MG tablet Take 2 mg by mouth daily with breakfast.     ipratropium-albuterol  (DUONEB) 0.5-2.5 (3) MG/3ML SOLN Take 3 mLs by nebulization every 6 (six) hours as needed. 360 mL 3   levothyroxine  (SYNTHROID ) 200 MCG tablet Take 200 mcg by mouth daily before breakfast.     Magnesium  Glycinate 120 MG CAPS Take 400 mg by mouth.     Oxycodone  HCl 10 MG TABS Take 1 tablet (10 mg total) by mouth every 6 (six) hours as needed. Okay to cut tablet in half if needed. 60 tablet 0   OXYGEN Inhale 3 L into the lungs continuous.     potassium chloride  SA (KLOR-CON  M) 20 MEQ tablet TAKE 1 TABLET BY MOUTH TWICE A DAY 180 tablet 1   prochlorperazine  (COMPAZINE ) 10 MG tablet Take 10 mg by mouth every 6 (six) hours as needed.     SEMAGLUTIDE,0.25 OR 0.5MG /DOS, [place] Inject 0.5 mg into the skin once a week.     tamsulosin  (FLOMAX ) 0.4 MG CAPS capsule 0.8 mg daily.     Tiotropium Bromide Monohydrate  (SPIRIVA  RESPIMAT) 2.5 MCG/ACT AERS Inhale 2 puffs into the lungs daily. 1  each 11   torsemide  (DEMADEX ) 20 MG tablet Take 1 tablet (20 mg total) by mouth daily. 30 tablet 0   vitamin B-12 (CYANOCOBALAMIN ) 1000 MCG tablet Take 1,000 mcg by mouth daily.     No current facility-administered medications for this visit.   Facility-Administered Medications Ordered in Other Visits  Medication Dose Route Frequency Provider Last Rate Last Admin   0.9 %  sodium chloride  infusion   Intravenous Continuous Waddell Iten J, MD 10 mL/hr at 12/11/23 1015 New Bag at 12/11/23 1015    OBJECTIVE: Vitals:   12/11/23 0920  BP: 118/65  Pulse: 60  Resp: 16  Temp: (!) 97 F (36.1 C)  SpO2: 98%      Body mass index is 33.84 kg/m.    ECOG FS:1 - Symptomatic but completely ambulatory  General: Well-developed, well-nourished, no acute distress.  Sitting in wheelchair. Eyes: Pink  conjunctiva, anicteric sclera. HEENT: Normocephalic, moist mucous membranes. Lungs: No audible wheezing or coughing. Heart: Regular rate and rhythm. Abdomen: Soft, nontender, no obvious distention. Musculoskeletal: No edema, cyanosis, or clubbing. Neuro: Alert, answering all questions appropriately. Cranial nerves grossly intact. Skin: No rashes or petechiae noted. Psych: Normal affect.  LAB RESULTS:  Lab Results  Component Value Date   NA 123 (L) 12/11/2023   K 3.5 12/11/2023   CL 91 (L) 12/11/2023   CO2 21 (L) 12/11/2023   GLUCOSE 110 (H) 12/11/2023   BUN 14 12/11/2023   CREATININE 1.32 (H) 12/11/2023   CALCIUM 8.5 (L) 12/11/2023   PROT 5.9 (L) 12/11/2023   ALBUMIN 2.9 (L) 12/11/2023   AST 25 12/11/2023   ALT 13 12/11/2023   ALKPHOS 72 12/11/2023   BILITOT 0.4 12/11/2023   GFRNONAA 54 (L) 12/11/2023   GFRAA 58 (L) 07/18/2019    Lab Results  Component Value Date   WBC 5.6 12/11/2023   NEUTROABS 3.7 12/11/2023   HGB 11.5 (L) 12/11/2023   HCT 33.9 (L) 12/11/2023   MCV 90.9 12/11/2023   PLT 251 12/11/2023     STUDIES: CT CHEST ABDOMEN PELVIS W CONTRAST Result Date:  12/08/2023 CLINICAL DATA:  Small-cell lung cancer, assess treatment response. * Tracking Code: BO * EXAM: CT CHEST, ABDOMEN, AND PELVIS WITH CONTRAST TECHNIQUE: Multidetector CT imaging of the chest, abdomen and pelvis was performed following the standard protocol during bolus administration of intravenous contrast. RADIATION DOSE REDUCTION: This exam was performed according to the departmental dose-optimization program which includes automated exposure control, adjustment of the mA and/or kV according to patient size and/or use of iterative reconstruction technique. CONTRAST:  OMNIPAQUE  IOHEXOL  300 MG/ML  SOLN COMPARISON:  Multiple priors including CT Sep 04, 2023 FINDINGS: CT CHEST FINDINGS Cardiovascular: Right chest Port-A-Cath with tip near the superior cavoatrial junction. Coronary artery calcifications. Lipomatous hypertrophy of the intra-atrial septum. Mediastinum/Nodes: No suspicious thyroid  nodule. Decreased size of the mediastinal and hilar lymph nodes. For reference: -subcarinal lymph node measures 11 mm in short axis on image 33/2 previously 3.4 cm -precarinal lymph node measures 15 mm in short axis on image 26/2 previously 3.7 cm. The esophagus is grossly unremarkable. Lungs/Pleura: Post radiation change in the suprahilar left upper lobe with decreased size of the nodular component along the posterior aspect near the hilum today measuring 18 mm on image 57/4 previously 3.6 cm when remeasured for consistency. Similar nodularity along the superior aspect of the post treatment change measuring 2.9 x 1.9 cm on image 41/4. Similar small right pleural effusion with adjacent relaxation atelectasis. Decreased size of the nodular foci of enhancement in the pleura for instance measuring 13 mm on image 39/2 previously 19 mm. New small right lower lobe pulmonary nodules for instance measuring 7 mm right lower lobe pulmonary nodule on image 125/4 Musculoskeletal: No aggressive lytic or blastic lesion of bone.  CT ABDOMEN PELVIS FINDINGS Hepatobiliary: Decreased size of the hepatic metastatic lesions. No new suspicious hepaticmlesion. For reference: -lesion the posterior right lobe of the liver measures 15 mm on image 67/2 previously 5.4 cm -lesion in segment IV right lobe of the liver measures 11 mm on image 66/2 previously 3.8 cm. Cholelithiasis.  No biliary ductal dilation. Pancreas: Unremarkable. No pancreatic ductal dilatation or surrounding inflammatory changes. Spleen: Normal in size without focal abnormality. Adrenals/Urinary Tract: No suspicious adrenal nodule. No hydronephrosis. Right upper pole renal cyst. Stomach/Bowel: Colonic diverticulosis. No evidence of bowel obstruction. Vascular/Lymphatic: Aortic atherosclerosis. No pathologically enlarged abdominal or  pelvic lymph nodes. Reproductive: Prostate is unremarkable. Other: No significant abdominopelvic free fluid. Musculoskeletal: No aggressive lytic or blastic lesion of bone. Left hemi transitional lumbosacral anatomy. Multilevel degenerative changes spine. IMPRESSION: 1. Decreased size of the left lower lobe pulmonary nodule posterior to the treatment change in the hilum and decreased mediastinal/hilar adenopathy. 2. Decreased size of the hepatic metastatic lesions. 3. Similar small malignant right pleural effusion with decreased size of the nodular foci of enhancement in the pleura. 4. New small right lower lobe pulmonary nodules measuring up to 7 mm, nonspecific but favored to reflect an infectious or inflammatory etiology. Suggest attention on follow-up imaging. 5. Aortic atherosclerosis. Aortic Atherosclerosis (ICD10-I70.0). Electronically Signed   By: Reyes Holder M.D.   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  Patient completed cycle 4 of carboplatinum, etoposide ,  and Tecentriq  on May 19, 2023.  Repeat PET scan on June 07, 2023 with significant improvement of disease burden and patient continued with maintenance Tecentriq .  CT scan results from Sep 04, 2023 revealed progression of disease in both lungs and liver.  MRI of the brain on Sep 13, 2023 was negative for disease.  Hospice and end-of-life were briefly discussed, but patient wished to continue with treatment.  Tecentriq  has been discontinued.  Repeat CT scan on December 05, 2023 reviewed independently and reported with decrease in size of lung and liver lesions.  Proceed with cycle 4 of lurbinectedin  today.  Return to clinic in 1 week for laboratory work only and then in 3 weeks for further evaluation and consideration of cycle 5.   Clinical stage IIB squamous cell carcinoma, left upper lobe lung:  Patient underwent biopsy with navigational bronchoscopy on October 19, 2018 confirming diagnosis.  Patient declined surgery and wished to pursue XRT along with concurrent chemotherapy.  Given his stage of disease he did not receive maintenance immunotherapy.  He completed cycle 7 of weekly carboplatinum and Taxol  on January 09, 2019 and then completed XRT on January 14, 2019.   History of melanoma: Unclear stage or depth, although by report was in situ.  Patient had Mohs surgery in fall 2019.  Previous lung biopsy consistent with squamous cell carcinoma. Anxiety/depression: Improved.  Continue current medications as prescribed.  Continue follow-up with primary care physician as well as psychiatry at the [facility]. Shortness of breath: Chronic and unchanged.  Patient's most recent thoracentesis on Sep 08, 2023 removed 1.4 L of fluid.  Continue supplemental oxygen as needed.  Appreciate pulmonary input. Cough: Patient does not complain of this today.   Pain: Patient does not complain of this today.  Continue oxycodone  as needed. Peripheral edema: Chronic and unchanged.  Patient has been instructed to increase his torsemide   to daily if his blood pressure remains above 110 systolic. Hyponatremia: Sodium significantly worse today.  I have encouraged increased salt and electrolyte intake.   Renal insufficiency: Chronic and unchanged.  Patient's creatinine is 1.32 today. Hypomagnesia: Magnesium  slightly decreased at 1.5, monitor. Anemia: Globin trending down slightly to 11.5, monitor. Dizziness/nausea: Appears unrelated to treatment or disease.  Symptomatically treated.  If no improvement will get MRI of the brain.  Patient expressed understanding and was in agreement with this plan. He also understands that He can call clinic at any time with any questions, concerns, or complaints.  Cancer Staging  Small cell lung cancer, left [facility]) Staging form: Lung, AJCC 8th Edition - Clinical stage from 02/24/2023: Stage IVA (cT4, cN2, cM1a) - Signed by Levi [name] Levi PARAS, MD on 02/24/2023  Squamous cell carcinoma lung, left (HCC) Staging form: Lung, AJCC 8th Edition - Clinical stage from 10/27/2018: Stage IIB (cT1c, cN1, cM0) - Signed by Levi [name] Levi PARAS, MD on 11/16/2018   Levi [name] [name], MD   12/11/2023 12:47 PM

## 2023-12-13 ENCOUNTER — Other Ambulatory Visit: Payer: Self-pay | Admitting: Oncology

## 2023-12-18 ENCOUNTER — Inpatient Hospital Stay

## 2023-12-18 DIAGNOSIS — Z5111 Encounter for antineoplastic chemotherapy: Secondary | ICD-10-CM | POA: Diagnosis not present

## 2023-12-18 DIAGNOSIS — C3492 Malignant neoplasm of unspecified part of left bronchus or lung: Secondary | ICD-10-CM

## 2023-12-18 LAB — CMP (CANCER CENTER ONLY)
ALT: 15 U/L (ref 0–44)
AST: 20 U/L (ref 15–41)
Albumin: 3.1 g/dL — ABNORMAL LOW (ref 3.5–5.0)
Alkaline Phosphatase: 70 U/L (ref 38–126)
Anion gap: 8 (ref 5–15)
BUN: 12 mg/dL (ref 8–23)
CO2: 23 mmol/L (ref 22–32)
Calcium: 8.6 mg/dL — ABNORMAL LOW (ref 8.9–10.3)
Chloride: 98 mmol/L (ref 98–111)
Creatinine: 1.39 mg/dL — ABNORMAL HIGH (ref 0.61–1.24)
GFR, Estimated: 51 mL/min — ABNORMAL LOW (ref >60)
Glucose, Bld: 105 mg/dL — ABNORMAL HIGH (ref 70–99)
Potassium: 4.2 mmol/L (ref 3.5–5.1)
Sodium: 129 mmol/L — ABNORMAL LOW (ref 135–145)
Total Bilirubin: 0.6 mg/dL (ref 0.0–1.2)
Total Protein: 6.1 g/dL — ABNORMAL LOW (ref 6.5–8.1)

## 2023-12-18 LAB — MAGNESIUM: Magnesium: 1.8 mg/dL (ref 1.7–2.4)

## 2023-12-28 ENCOUNTER — Encounter: Payer: Self-pay | Admitting: Oncology

## 2024-01-02 ENCOUNTER — Inpatient Hospital Stay (HOSPITAL_BASED_OUTPATIENT_CLINIC_OR_DEPARTMENT_OTHER): Admitting: Oncology

## 2024-01-02 ENCOUNTER — Inpatient Hospital Stay

## 2024-01-02 ENCOUNTER — Inpatient Hospital Stay: Attending: Oncology

## 2024-01-02 ENCOUNTER — Encounter: Payer: Self-pay | Admitting: Oncology

## 2024-01-02 VITALS — BP 118/59 | HR 65 | Temp 98.5°F | Resp 18 | Ht 72.0 in | Wt 248.0 lb

## 2024-01-02 DIAGNOSIS — C787 Secondary malignant neoplasm of liver and intrahepatic bile duct: Secondary | ICD-10-CM | POA: Diagnosis present

## 2024-01-02 DIAGNOSIS — C3412 Malignant neoplasm of upper lobe, left bronchus or lung: Secondary | ICD-10-CM | POA: Diagnosis present

## 2024-01-02 DIAGNOSIS — C3492 Malignant neoplasm of unspecified part of left bronchus or lung: Secondary | ICD-10-CM

## 2024-01-02 DIAGNOSIS — Z79630 Long term (current) use of alkylating agent: Secondary | ICD-10-CM | POA: Insufficient documentation

## 2024-01-02 DIAGNOSIS — Z5111 Encounter for antineoplastic chemotherapy: Secondary | ICD-10-CM | POA: Diagnosis present

## 2024-01-02 LAB — CBC WITH DIFFERENTIAL (CANCER CENTER ONLY)
Abs Immature Granulocytes: 0.19 K/uL — ABNORMAL HIGH (ref 0.00–0.07)
Basophils Absolute: 0.1 K/uL (ref 0.0–0.1)
Basophils Relative: 1 %
Eosinophils Absolute: 0.1 K/uL (ref 0.0–0.5)
Eosinophils Relative: 3 %
HCT: 34.6 % — ABNORMAL LOW (ref 39.0–52.0)
Hemoglobin: 11.6 g/dL — ABNORMAL LOW (ref 13.0–17.0)
Immature Granulocytes: 4 %
Lymphocytes Relative: 15 %
Lymphs Abs: 0.8 K/uL (ref 0.7–4.0)
MCH: 31.4 pg (ref 26.0–34.0)
MCHC: 33.5 g/dL (ref 30.0–36.0)
MCV: 93.5 fL (ref 80.0–100.0)
Monocytes Absolute: 0.8 K/uL (ref 0.1–1.0)
Monocytes Relative: 16 %
Neutro Abs: 3.2 K/uL (ref 1.7–7.7)
Neutrophils Relative %: 61 %
Platelet Count: 221 K/uL (ref 150–400)
RBC: 3.7 MIL/uL — ABNORMAL LOW (ref 4.22–5.81)
RDW: 17.6 % — ABNORMAL HIGH (ref 11.5–15.5)
WBC Count: 5.2 K/uL (ref 4.0–10.5)
nRBC: 0 % (ref 0.0–0.2)

## 2024-01-02 LAB — CMP (CANCER CENTER ONLY)
ALT: 12 U/L (ref 0–44)
AST: 24 U/L (ref 15–41)
Albumin: 3.1 g/dL — ABNORMAL LOW (ref 3.5–5.0)
Alkaline Phosphatase: 69 U/L (ref 38–126)
Anion gap: 8 (ref 5–15)
BUN: 24 mg/dL — ABNORMAL HIGH (ref 8–23)
CO2: 21 mmol/L — ABNORMAL LOW (ref 22–32)
Calcium: 8.3 mg/dL — ABNORMAL LOW (ref 8.9–10.3)
Chloride: 99 mmol/L (ref 98–111)
Creatinine: 1.28 mg/dL — ABNORMAL HIGH (ref 0.61–1.24)
GFR, Estimated: 56 mL/min — ABNORMAL LOW (ref >60)
Glucose, Bld: 107 mg/dL — ABNORMAL HIGH (ref 70–99)
Potassium: 3.5 mmol/L (ref 3.5–5.1)
Sodium: 128 mmol/L — ABNORMAL LOW (ref 135–145)
Total Bilirubin: 0.6 mg/dL (ref 0.0–1.2)
Total Protein: 6.1 g/dL — ABNORMAL LOW (ref 6.5–8.1)

## 2024-01-02 LAB — MAGNESIUM: Magnesium: 1.6 mg/dL — ABNORMAL LOW (ref 1.7–2.4)

## 2024-01-02 MED ORDER — PALONOSETRON HCL INJECTION 0.25 MG/5ML
0.2500 mg | Freq: Once | INTRAVENOUS | Status: AC
  Administered 2024-01-02: 0.25 mg via INTRAVENOUS
  Filled 2024-01-02: qty 5

## 2024-01-02 MED ORDER — SODIUM CHLORIDE 0.9 % IV SOLN
INTRAVENOUS | Status: DC
  Filled 2024-01-02: qty 250

## 2024-01-02 MED ORDER — SODIUM CHLORIDE 0.9% FLUSH
10.0000 mL | INTRAVENOUS | Status: DC | PRN
  Administered 2024-01-02: 10 mL
  Filled 2024-01-02: qty 10

## 2024-01-02 MED ORDER — SODIUM CHLORIDE 0.9 % IV SOLN
3.2000 mg/m2 | Freq: Once | INTRAVENOUS | Status: AC
  Administered 2024-01-02: 7.65 mg via INTRAVENOUS
  Filled 2024-01-02: qty 15.3

## 2024-01-02 MED ORDER — DEXAMETHASONE SODIUM PHOSPHATE 10 MG/ML IJ SOLN
10.0000 mg | Freq: Once | INTRAMUSCULAR | Status: AC
  Administered 2024-01-02: 10 mg via INTRAVENOUS
  Filled 2024-01-02: qty 1

## 2024-01-02 NOTE — Progress Notes (Signed)
 Patient doesn't have an appointment for November. He is feeling very weak and dizzy when he goes to stand up.

## 2024-01-02 NOTE — Progress Notes (Signed)
 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  INTERVAL HISTORY: Patient returns to clinic today for further evaluation and consideration of cycle 5 of Lurbinectedin .  He continues to have chronic weakness and fatigue, but otherwise feels well.  He denies any recent fevers or illnesses.  He has chronic shortness of breath requiring oxygen.  He has no neurologic complaints.  He denies any chest pain, cough, or hemoptysis.  He has a good appetite and denies weight loss.  He denies any nausea, vomiting, constipation, or diarrhea.  He has no urinary complaints.  Patient offers no further specific complaints today.  REVIEW OF SYSTEMS:   Review of Systems  Constitutional:  Positive for malaise/fatigue. Negative for fever and weight loss.  HENT: Negative.  Negative for congestion.   Respiratory:  Positive for shortness of breath. Negative for cough and hemoptysis.   Cardiovascular: Negative.  Negative for chest pain and leg swelling.  Gastrointestinal: Negative.  Negative for abdominal pain and nausea.  Genitourinary: Negative.  Negative for dysuria.  Musculoskeletal: Negative.  Negative for back pain.  Skin:  Negative for rash.  Neurological:  Positive for weakness. Negative for dizziness, focal weakness and headaches.  Endo/Heme/Allergies:  Does not bruise/bleed easily.  Psychiatric/Behavioral: Negative.  Negative for depression. The patient is not nervous/anxious.      As per HPI. Otherwise, a complete review of systems is negative.  PAST MEDICAL HISTORY: Past Medical History:  Diagnosis Date   Asthma    COPD (chronic obstructive pulmonary disease) (HCC)    Hearing loss    Hypertension    Hypothyroidism    Mass of left lung    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HCC)    Prostate enlargement    Shortness of breath    Squamous cell carcinoma of lung, left (HCC)    Thyroid  disease    Tobacco abuse     PAST SURGICAL HISTORY: Past Surgical History:  Procedure Laterality Date   CARDIOVERSION N/A    CARDIOVERSION N/A 11/24/2021   Procedure: CARDIOVERSION;  Surgeon: Mady Bruckner, MD;  Location: [facility];  Service: Cardiovascular;  Laterality: N/A;   ELECTROMAGNETIC NAVIGATION BROCHOSCOPY Left 10/19/2018   Procedure: ELECTROMAGNETIC NAVIGATION BRONCHOSCOPY LEFT;  Surgeon: Tamea Dedra CROME, MD;  Location: [facility];  Service: Cardiopulmonary;  Laterality: Left;   ENDOBRONCHIAL ULTRASOUND Left 10/19/2018   Procedure: ENDOBRONCHIAL ULTRASOUND LEFT;  Surgeon: Tamea Dedra CROME, MD;  Location: [facility];  Service: Cardiopulmonary;  Laterality: Left;   IR IMAGING GUIDED PORT INSERTION  03/03/2023   IR THORACENTESIS ASP PLEURAL SPACE W/IMG GUIDE  03/01/2023   MELANOMA EXCISION Left    NO PAST SURGERIES      FAMILY HISTORY: Family History  Problem Relation Age of Onset   Aneurysm Mother    Cancer Father     ADVANCED DIRECTIVES (Y/N):  N  HEALTH MAINTENANCE: Social History   Tobacco Use   Smoking status: Former    Current packs/day: 0.00    Average packs/day: 0.3 packs/day for 62.0 years (15.5 ttl pk-yrs)    Types: Cigarettes  Start date: 09/04/1956    Quit date: 09/05/2018    Years since quitting: 5.3   Smokeless tobacco: Never  Vaping Use   Vaping status: Never Used  Substance Use Topics   Alcohol use: No    Alcohol/week: 0.0 standard drinks of alcohol   Drug use: No     Colonoscopy:  PAP:  Bone density:  Lipid panel:  No Known  Allergies  Current Outpatient Medications  Medication Sig Dispense Refill   albuterol  (PROVENTIL  HFA;VENTOLIN  HFA) 108 (90 Base) MCG/ACT inhaler Inhale 2 puffs into the lungs every 6 (six) hours as needed for wheezing or shortness of breath. 1 Inhaler 0   albuterol  (PROVENTIL ) (2.5 MG/3ML) 0.083% nebulizer solution Take 2.5 mg by nebulization every 6 (six) hours as needed for wheezing or shortness of breath.     apixaban  (ELIQUIS ) 5 MG TABS tablet Take 1 tablet (5 mg total) by mouth 2 (two) times daily. 60 tablet 3   carboxymethylcellulose (REFRESH PLUS) 0.5 % SOLN 1 drop 4 (four) times daily as needed.     chlorpheniramine-HYDROcodone  (TUSSIONEX) 10-8 MG/5ML Take 5 mLs by mouth every 12 (twelve) hours as needed for cough. 115 mL 0   finasteride (PROSCAR) 5 MG tablet Take 5 mg by mouth daily.     fluticasone-salmeterol (ADVAIR) 250-50 MCG/ACT AEPB Inhale 1 puff into the lungs in the morning and at bedtime.     glimepiride  (AMARYL ) 2 MG tablet Take 2 mg by mouth daily with breakfast.     ipratropium-albuterol  (DUONEB) 0.5-2.5 (3) MG/3ML SOLN Take 3 mLs by nebulization every 6 (six) hours as needed. 360 mL 3   levothyroxine  (SYNTHROID ) 200 MCG tablet Take 200 mcg by mouth daily before breakfast.     Magnesium  Glycinate 120 MG CAPS Take 400 mg by mouth.     Oxycodone  HCl 10 MG TABS Take 1 tablet (10 mg total) by mouth every 6 (six) hours as needed. Okay to cut tablet in half if needed. 60 tablet 0   OXYGEN Inhale 3 L into the lungs continuous.     potassium chloride  SA (KLOR-CON  M) 20 MEQ tablet TAKE 1 TABLET BY MOUTH TWICE A DAY 180 tablet 1   prochlorperazine  (COMPAZINE ) 10 MG tablet Take 10 mg by mouth every 6 (six) hours as needed.     SEMAGLUTIDE,0.25 OR 0.5MG /DOS, [place] Inject 0.5 mg into the skin once a week.     tamsulosin  (FLOMAX ) 0.4 MG CAPS capsule 0.8 mg daily.     Tiotropium Bromide Monohydrate  (SPIRIVA  RESPIMAT) 2.5 MCG/ACT AERS Inhale 2 puffs into the lungs daily. 1 each 11   torsemide   (DEMADEX ) 20 MG tablet Take 1 tablet (20 mg total) by mouth daily. 30 tablet 0   vitamin B-12 (CYANOCOBALAMIN ) 1000 MCG tablet Take 1,000 mcg by mouth daily.     No current facility-administered medications for this visit.   Facility-Administered Medications Ordered in Other Visits  Medication Dose Route Frequency Provider Last Rate Last Admin   0.9 %  sodium chloride  infusion   Intravenous Continuous Joliyah Lippens J, MD   Stopped at 01/02/24 1225   sodium chloride  flush (NS) 0.9 % injection 10 mL  10 mL Intracatheter PRN [name] [name], MD   10 mL at 01/02/24 1225    OBJECTIVE: Vitals:   01/02/24 0912  BP: (!) 118/59  Pulse: 65  Resp: 18  Temp: 98.5 F (36.9 C)  SpO2: 98%       Body mass index is 33.63 kg/m.    ECOG  FS:1 - Symptomatic but completely ambulatory  General: Well-developed, well-nourished, no acute distress. Eyes: Pink conjunctiva, anicteric sclera. HEENT: Normocephalic, moist mucous membranes. Lungs: No audible wheezing or coughing. Heart: Regular rate and rhythm. Abdomen: Soft, nontender, no obvious distention. Musculoskeletal: No edema, cyanosis, or clubbing. Neuro: Alert, answering all questions appropriately. Cranial nerves grossly intact. Skin: No rashes or petechiae noted. Psych: Normal affect.  LAB RESULTS:  Lab Results  Component Value Date   NA 128 (L) 01/02/2024   K 3.5 01/02/2024   CL 99 01/02/2024   CO2 21 (L) 01/02/2024   GLUCOSE 107 (H) 01/02/2024   BUN 24 (H) 01/02/2024   CREATININE 1.28 (H) 01/02/2024   CALCIUM 8.3 (L) 01/02/2024   PROT 6.1 (L) 01/02/2024   ALBUMIN 3.1 (L) 01/02/2024   AST 24 01/02/2024   ALT 12 01/02/2024   ALKPHOS 69 01/02/2024   BILITOT 0.6 01/02/2024   GFRNONAA 56 (L) 01/02/2024   GFRAA 58 (L) 07/18/2019    Lab Results  Component Value Date   WBC 5.2 01/02/2024   NEUTROABS 3.2 01/02/2024   HGB 11.6 (L) 01/02/2024   HCT 34.6 (L) 01/02/2024   MCV 93.5 01/02/2024   PLT 221 01/02/2024      STUDIES: CT CHEST ABDOMEN PELVIS W CONTRAST Result Date: 12/08/2023 CLINICAL DATA:  Small-cell lung cancer, assess treatment response. * Tracking Code: BO * EXAM: CT CHEST, ABDOMEN, AND PELVIS WITH CONTRAST TECHNIQUE: Multidetector CT imaging of the chest, abdomen and pelvis was performed following the standard protocol during bolus administration of intravenous contrast. RADIATION DOSE REDUCTION: This exam was performed according to the departmental dose-optimization program which includes automated exposure control, adjustment of the mA and/or kV according to patient size and/or use of iterative reconstruction technique. CONTRAST:  OMNIPAQUE  IOHEXOL  300 MG/ML  SOLN COMPARISON:  Multiple priors including CT Sep 04, 2023 FINDINGS: CT CHEST FINDINGS Cardiovascular: Right chest Port-A-Cath with tip near the superior cavoatrial junction. Coronary artery calcifications. Lipomatous hypertrophy of the intra-atrial septum. Mediastinum/Nodes: No suspicious thyroid  nodule. Decreased size of the mediastinal and hilar lymph nodes. For reference: -subcarinal lymph node measures 11 mm in short axis on image 33/2 previously 3.4 cm -precarinal lymph node measures 15 mm in short axis on image 26/2 previously 3.7 cm. The esophagus is grossly unremarkable. Lungs/Pleura: Post radiation change in the suprahilar left upper lobe with decreased size of the nodular component along the posterior aspect near the hilum today measuring 18 mm on image 57/4 previously 3.6 cm when remeasured for consistency. Similar nodularity along the superior aspect of the post treatment change measuring 2.9 x 1.9 cm on image 41/4. Similar small right pleural effusion with adjacent relaxation atelectasis. Decreased size of the nodular foci of enhancement in the pleura for instance measuring 13 mm on image 39/2 previously 19 mm. New small right lower lobe pulmonary nodules for instance measuring 7 mm right lower lobe pulmonary nodule on image  125/4 Musculoskeletal: No aggressive lytic or blastic lesion of bone. CT ABDOMEN PELVIS FINDINGS Hepatobiliary: Decreased size of the hepatic metastatic lesions. No new suspicious hepaticmlesion. For reference: -lesion the posterior right lobe of the liver measures 15 mm on image 67/2 previously 5.4 cm -lesion in segment IV right lobe of the liver measures 11 mm on image 66/2 previously 3.8 cm. Cholelithiasis.  No biliary ductal dilation. Pancreas: Unremarkable. No pancreatic ductal dilatation or surrounding inflammatory changes. Spleen: Normal in size without focal abnormality. Adrenals/Urinary Tract: No suspicious adrenal nodule. No hydronephrosis. Right upper pole renal cyst. Stomach/Bowel:  Colonic diverticulosis. No evidence of bowel obstruction. Vascular/Lymphatic: Aortic atherosclerosis. No pathologically enlarged abdominal or pelvic lymph nodes. Reproductive: Prostate is unremarkable. Other: No significant abdominopelvic free fluid. Musculoskeletal: No aggressive lytic or blastic lesion of bone. Left hemi transitional lumbosacral anatomy. Multilevel degenerative changes spine. IMPRESSION: 1. Decreased size of the left lower lobe pulmonary nodule posterior to the treatment change in the hilum and decreased mediastinal/hilar adenopathy. 2. Decreased size of the hepatic metastatic lesions. 3. Similar small malignant right pleural effusion with decreased size of the nodular foci of enhancement in the pleura. 4. New small right lower lobe pulmonary nodules measuring up to 7 mm, nonspecific but favored to reflect an infectious or inflammatory etiology. Suggest attention on follow-up imaging. 5. Aortic atherosclerosis. Aortic Atherosclerosis (ICD10-I70.0). Electronically Signed   By: Reyes Holder M.D.   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  Patient completed cycle 4 of carboplatinum, etoposide , and Tecentriq  on May 19, 2023.  Repeat PET scan on June 07, 2023 with significant improvement of disease burden and patient continued with maintenance Tecentriq .  CT scan results from Sep 04, 2023 revealed progression of disease in both lungs and liver.  MRI of the brain on Sep 13, 2023 was negative for disease.  Hospice and end-of-life were briefly discussed, but patient wished to continue with treatment.  Tecentriq  has been discontinued.  Repeat CT scan on December 05, 2023 reviewed independently with decrease in size of lung and liver lesions.  Proceed with cycle 5 of lurbinectedin  today.  Return to clinic in 3 weeks for further evaluation and consideration of cycle 6.  Will reimage in approximately November 2025.   Clinical stage IIB squamous cell carcinoma, left upper lobe lung:  Patient underwent biopsy with navigational bronchoscopy on October 19, 2018 confirming diagnosis.  Patient declined surgery and wished to pursue XRT along with concurrent chemotherapy.  Given his stage of disease he did not receive maintenance immunotherapy.  He completed cycle 7 of weekly carboplatinum and Taxol  on January 09, 2019 and then completed XRT on January 14, 2019.   History of melanoma: Unclear stage or depth, although by report was in situ.  Patient had Mohs surgery in fall 2019.  Previous lung biopsy consistent with squamous cell carcinoma. Anxiety/depression: Improved.  Continue current medications as prescribed.  Continue follow-up with primary care physician as well as psychiatry at the [facility]. Shortness of breath: Chronic and unchanged.  Patient's most recent thoracentesis on Sep 08, 2023 removed 1.4 L of fluid.  Continue supplemental oxygen as needed.  Appreciate pulmonary input. Cough: Patient does not complain of this today.   Pain: Patient does not complain of this today.  Continue oxycodone  as needed. Peripheral edema: Chronic and  unchanged.  Patient has been instructed to increase his torsemide  to daily if his blood pressure remains above 110 systolic. Hyponatremia: Sodium level is mildly improved to 128.  Monitor. Renal insufficiency: Chronic and unchanged.  Patient's creatinine is 1.28.   Hypomagnesia: Magnesium  decreased, but mildly improved to 1.6.  Monitor.   Anemia: Chronic and unchanged.  Patient's hemoglobin is 11.6. Dizziness/nausea: Patient does not complain of this today.  Patient expressed understanding and was in agreement with this plan. He also understands that He can call clinic at any time with any questions, concerns, or complaints.  Cancer Staging  Small cell lung cancer, left [facility] Staging form: Lung, AJCC 8th Edition - Clinical stage from 02/24/2023: Stage IVA (cT4, cN2, cM1a) - Signed by [name] [name], MD on 02/24/2023  Squamous cell carcinoma lung, left (HCC) Staging form: Lung, AJCC 8th Edition - Clinical stage from 10/27/2018: Stage IIB (cT1c, cN1, cM0) - Signed by [name] [name], MD on 11/16/2018   [name] [name], MD   01/02/2024 4:49 PM

## 2024-01-02 NOTE — Patient Instructions (Signed)
 [facility] - A DEPT OF [facility]  Discharge Instructions: Thank you for choosing [facility] to provide your oncology and hematology care.  If you have a lab appointment with the Cancer Center, please go directly to the Cancer Center and check in at the registration area.  Wear comfortable clothing and clothing appropriate for easy access to any Portacath or PICC line.   We strive to give you quality time with your provider. You may need to reschedule your appointment if you arrive late (15 or more minutes).  Arriving late affects you and other patients whose appointments are after yours.  Also, if you miss three or more appointments without notifying the office, you may be dismissed from the clinic at the provider's discretion.      For prescription refill requests, have your pharmacy contact our office and allow 72 hours for refills to be completed.    Today you received the following chemotherapy and/or immunotherapy agents lurbinectedin       To help prevent nausea and vomiting after your treatment, we encourage you to take your nausea medication as directed.  BELOW ARE SYMPTOMS THAT SHOULD BE REPORTED IMMEDIATELY: *FEVER GREATER THAN 100.4 F (38 C) OR HIGHER *CHILLS OR SWEATING *NAUSEA AND VOMITING THAT IS NOT CONTROLLED WITH YOUR NAUSEA MEDICATION *UNUSUAL SHORTNESS OF BREATH *UNUSUAL BRUISING OR BLEEDING *URINARY PROBLEMS (pain or burning when urinating, or frequent urination) *BOWEL PROBLEMS (unusual diarrhea, constipation, pain near the anus) TENDERNESS IN MOUTH AND THROAT WITH OR WITHOUT PRESENCE OF ULCERS (sore throat, sores in mouth, or a toothache) UNUSUAL RASH, SWELLING OR PAIN  UNUSUAL VAGINAL DISCHARGE OR ITCHING   Items with * indicate a potential emergency and should be followed up as soon as possible or go to the Emergency Department if any problems should occur.  Please show the CHEMOTHERAPY ALERT CARD or  IMMUNOTHERAPY ALERT CARD at check-in to the Emergency Department and triage nurse.  Should you have questions after your visit or need to cancel or reschedule your appointment, please contact [facility] - A DEPT OF [facility]  [phone] and follow the prompts.  Office hours are 8:00 a.m. to 4:30 p.m. Monday - Friday. Please note that voicemails left after 4:00 p.m. may not be returned until the following business day.  We are closed weekends and major holidays. You have access to a nurse at all times for urgent questions. Please call the main number to the clinic [phone] and follow the prompts.  For any non-urgent questions, you may also contact your provider using MyChart. We now offer e-Visits for anyone 29 and older to request care online for non-urgent symptoms. For details visit [email or web address].   Also download the MyChart app! Go to the app store, search "MyChart", open the app, select [place], and log in with your MyChart username and password.

## 2024-01-22 ENCOUNTER — Inpatient Hospital Stay

## 2024-01-22 ENCOUNTER — Inpatient Hospital Stay: Admitting: Oncology

## 2024-01-22 ENCOUNTER — Encounter: Payer: Self-pay | Admitting: Oncology

## 2024-01-22 VITALS — BP 118/52 | HR 58 | Resp 18

## 2024-01-22 VITALS — BP 112/64 | HR 85 | Temp 97.4°F | Resp 18 | Ht 72.0 in | Wt 251.0 lb

## 2024-01-22 DIAGNOSIS — C3492 Malignant neoplasm of unspecified part of left bronchus or lung: Secondary | ICD-10-CM

## 2024-01-22 DIAGNOSIS — Z5111 Encounter for antineoplastic chemotherapy: Secondary | ICD-10-CM | POA: Diagnosis not present

## 2024-01-22 LAB — CBC WITH DIFFERENTIAL (CANCER CENTER ONLY)
Abs Immature Granulocytes: 0.09 K/uL — ABNORMAL HIGH (ref 0.00–0.07)
Basophils Absolute: 0 K/uL (ref 0.0–0.1)
Basophils Relative: 1 %
Eosinophils Absolute: 0.2 K/uL (ref 0.0–0.5)
Eosinophils Relative: 3 %
HCT: 33 % — ABNORMAL LOW (ref 39.0–52.0)
Hemoglobin: 11.1 g/dL — ABNORMAL LOW (ref 13.0–17.0)
Immature Granulocytes: 2 %
Lymphocytes Relative: 14 %
Lymphs Abs: 0.7 K/uL (ref 0.7–4.0)
MCH: 31.4 pg (ref 26.0–34.0)
MCHC: 33.6 g/dL (ref 30.0–36.0)
MCV: 93.2 fL (ref 80.0–100.0)
Monocytes Absolute: 1 K/uL (ref 0.1–1.0)
Monocytes Relative: 19 %
Neutro Abs: 3.2 K/uL (ref 1.7–7.7)
Neutrophils Relative %: 61 %
Platelet Count: 258 K/uL (ref 150–400)
RBC: 3.54 MIL/uL — ABNORMAL LOW (ref 4.22–5.81)
RDW: 15.9 % — ABNORMAL HIGH (ref 11.5–15.5)
WBC Count: 5.2 K/uL (ref 4.0–10.5)
nRBC: 0 % (ref 0.0–0.2)

## 2024-01-22 LAB — CMP (CANCER CENTER ONLY)
ALT: 12 U/L (ref 0–44)
AST: 26 U/L (ref 15–41)
Albumin: 3 g/dL — ABNORMAL LOW (ref 3.5–5.0)
Alkaline Phosphatase: 69 U/L (ref 38–126)
Anion gap: 9 (ref 5–15)
BUN: 16 mg/dL (ref 8–23)
CO2: 22 mmol/L (ref 22–32)
Calcium: 8.2 mg/dL — ABNORMAL LOW (ref 8.9–10.3)
Chloride: 94 mmol/L — ABNORMAL LOW (ref 98–111)
Creatinine: 1.44 mg/dL — ABNORMAL HIGH (ref 0.61–1.24)
GFR, Estimated: 49 mL/min — ABNORMAL LOW (ref >60)
Glucose, Bld: 117 mg/dL — ABNORMAL HIGH (ref 70–99)
Potassium: 3.4 mmol/L — ABNORMAL LOW (ref 3.5–5.1)
Sodium: 125 mmol/L — ABNORMAL LOW (ref 135–145)
Total Bilirubin: 0.6 mg/dL (ref 0.0–1.2)
Total Protein: 5.9 g/dL — ABNORMAL LOW (ref 6.5–8.1)

## 2024-01-22 LAB — MAGNESIUM: Magnesium: 1.5 mg/dL — ABNORMAL LOW (ref 1.7–2.4)

## 2024-01-22 MED ORDER — DEXAMETHASONE SODIUM PHOSPHATE 10 MG/ML IJ SOLN
10.0000 mg | Freq: Once | INTRAMUSCULAR | Status: AC
  Administered 2024-01-22: 10 mg via INTRAVENOUS
  Filled 2024-01-22: qty 1

## 2024-01-22 MED ORDER — PALONOSETRON HCL INJECTION 0.25 MG/5ML
0.2500 mg | Freq: Once | INTRAVENOUS | Status: AC
  Administered 2024-01-22: 0.25 mg via INTRAVENOUS
  Filled 2024-01-22: qty 5

## 2024-01-22 MED ORDER — SODIUM CHLORIDE 0.9 % IV SOLN
INTRAVENOUS | Status: DC
  Filled 2024-01-22: qty 250

## 2024-01-22 MED ORDER — SODIUM CHLORIDE 0.9 % IV SOLN
3.2000 mg/m2 | Freq: Once | INTRAVENOUS | Status: AC
  Administered 2024-01-22: 7.65 mg via INTRAVENOUS
  Filled 2024-01-22: qty 15.3

## 2024-01-22 NOTE — Progress Notes (Signed)
 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  INTERVAL HISTORY: Patient returns to clinic today for further evaluation and consideration of cycle 7 of Lurbinectedin .  He is increased fatigue this week, but states he continues to have a good appetite.  He otherwise feels well.  He denies any recent fevers or illnesses.  He has chronic shortness of breath requiring oxygen.  He has no neurologic complaints.  He denies any chest pain, cough, or hemoptysis.  He has a good appetite and denies weight loss.  He denies any nausea, vomiting, constipation, or diarrhea.  He has no urinary complaints.  Patient offers no further specific complaints today.  REVIEW OF SYSTEMS:   Review of Systems  Constitutional:  Positive for malaise/fatigue. Negative for fever and weight loss.  HENT: Negative.  Negative for congestion.   Respiratory:  Positive for shortness of breath. Negative for cough and hemoptysis.   Cardiovascular: Negative.  Negative for chest pain and leg swelling.  Gastrointestinal: Negative.  Negative for abdominal pain and nausea.  Genitourinary: Negative.  Negative for dysuria.  Musculoskeletal: Negative.  Negative for back pain.  Skin:  Negative for rash.  Neurological:  Positive for weakness. Negative for dizziness, focal weakness and headaches.  Endo/Heme/Allergies:  Does not bruise/bleed easily.  Psychiatric/Behavioral: Negative.  Negative for depression. The  patient is not nervous/anxious.     As per HPI. Otherwise, a complete review of systems is negative.  PAST MEDICAL HISTORY: Past Medical History:  Diagnosis Date   Asthma    COPD (chronic obstructive pulmonary disease) (HCC)    Hearing loss    Hypertension    Hypothyroidism    Mass of left lung    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HCC)    Prostate enlargement    Shortness of breath    Squamous cell carcinoma of lung, left (HCC)    Thyroid  disease    Tobacco abuse     PAST SURGICAL HISTORY: Past Surgical History:  Procedure Laterality Date   CARDIOVERSION N/A    CARDIOVERSION N/A 11/24/2021   Procedure: CARDIOVERSION;  Surgeon: Mady Bruckner, MD;  Location: [facility] ORS;  Service: Cardiovascular;  Laterality: N/A;   ELECTROMAGNETIC NAVIGATION BROCHOSCOPY Left 10/19/2018   Procedure: ELECTROMAGNETIC NAVIGATION BRONCHOSCOPY LEFT;  Surgeon: Tamea Dedra CROME, MD;  Location: [facility] OR[facility];  Service: Cardiopulmonary;  Laterality: Left;   ENDOBRONCHIAL ULTRASOUND Left 10/19/2018   Procedure: ENDOBRONCHIAL ULTRASOUND LEFT;  Surgeon: Tamea Dedra CROME, MD;  Location: [facility] OR[facility];  Service: Cardiopulmonary;  Laterality: Left;   IR IMAGING GUIDED PORT INSERTION  03/03/2023   IR THORACENTESIS ASP PLEURAL SPACE W/IMG GUIDE  03/01/2023   MELANOMA EXCISION Left    NO PAST SURGERIES      FAMILY HISTORY: Family History  Problem Relation Age of Onset   Aneurysm Mother    Cancer Father     ADVANCED DIRECTIVES (Y/N):  N  HEALTH MAINTENANCE: Social History   Tobacco Use   Smoking status: Former    Current packs/day: 0.00    Average packs/day: 0.3 packs/day for 62.0 years (  15.5 ttl pk-yrs)    Types: Cigarettes    Start date: 09/04/1956    Quit date: 09/05/2018    Years since quitting: 5.3   Smokeless tobacco: Never  Vaping Use   Vaping status: Never Used  Substance Use Topics   Alcohol use: No    Alcohol/week: 0.0 standard drinks of alcohol   Drug use: No     Colonoscopy:  PAP:  Bone  density:  Lipid panel:  No Known Allergies  Current Outpatient Medications  Medication Sig Dispense Refill   albuterol  (PROVENTIL  HFA;VENTOLIN  HFA) 108 (90 Base) MCG/ACT inhaler Inhale 2 puffs into the lungs every 6 (six) hours as needed for wheezing or shortness of breath. 1 Inhaler 0   albuterol  (PROVENTIL ) (2.5 MG/3ML) 0.083% nebulizer solution Take 2.5 mg by nebulization every 6 (six) hours as needed for wheezing or shortness of breath.     apixaban  (ELIQUIS ) 5 MG TABS tablet Take 1 tablet (5 mg total) by mouth 2 (two) times daily. 60 tablet 3   carboxymethylcellulose (REFRESH PLUS) 0.5 % SOLN 1 drop 4 (four) times daily as needed.     chlorpheniramine-HYDROcodone  (TUSSIONEX) 10-8 MG/5ML Take 5 mLs by mouth every 12 (twelve) hours as needed for cough. 115 mL 0   finasteride (PROSCAR) 5 MG tablet Take 5 mg by mouth daily.     fluticasone-salmeterol (ADVAIR) 250-50 MCG/ACT AEPB Inhale 1 puff into the lungs in the morning and at bedtime.     glimepiride  (AMARYL ) 2 MG tablet Take 2 mg by mouth daily with breakfast.     ipratropium-albuterol  (DUONEB) 0.5-2.5 (3) MG/3ML SOLN Take 3 mLs by nebulization every 6 (six) hours as needed. 360 mL 3   levothyroxine  (SYNTHROID ) 200 MCG tablet Take 200 mcg by mouth daily before breakfast.     Magnesium  Glycinate 120 MG CAPS Take 400 mg by mouth.     Oxycodone  HCl 10 MG TABS Take 1 tablet (10 mg total) by mouth every 6 (six) hours as needed. Okay to cut tablet in half if needed. 60 tablet 0   OXYGEN Inhale 3 L into the lungs continuous.     potassium chloride  SA (KLOR-CON  M) 20 MEQ tablet TAKE 1 TABLET BY MOUTH TWICE A DAY 180 tablet 1   prochlorperazine  (COMPAZINE ) 10 MG tablet Take 10 mg by mouth every 6 (six) hours as needed.     SEMAGLUTIDE,0.25 OR 0.5MG /DOS, [place] Inject 0.5 mg into the skin once a week.     tamsulosin  (FLOMAX ) 0.4 MG CAPS capsule 0.8 mg daily.     Tiotropium Bromide Monohydrate  (SPIRIVA  RESPIMAT) 2.5 MCG/ACT AERS Inhale 2 puffs into the  lungs daily. 1 each 11   torsemide  (DEMADEX ) 20 MG tablet Take 1 tablet (20 mg total) by mouth daily. 30 tablet 0   vitamin B-12 (CYANOCOBALAMIN ) 1000 MCG tablet Take 1,000 mcg by mouth daily.     No current facility-administered medications for this visit.    OBJECTIVE: Vitals:   01/22/24 0825  BP: 112/64  Pulse: 85  Resp: 18  Temp: (!) 97.4 F (36.3 C)  SpO2: 99%       Body mass index is 34.04 kg/m.    ECOG FS:1 - Symptomatic but completely ambulatory  General: Well-developed, well-nourished, no acute distress.  Sitting in a wheelchair. Eyes: Pink conjunctiva, anicteric sclera. HEENT: Normocephalic, moist mucous membranes. Lungs: No audible wheezing or coughing. Heart: Regular rate and rhythm. Abdomen: Soft, nontender, no obvious distention. Musculoskeletal: No edema, cyanosis, or clubbing. Neuro: Alert, answering all questions  appropriately. Cranial nerves grossly intact. Skin: No rashes or petechiae noted. Psych: Normal affect.  LAB RESULTS:  Lab Results  Component Value Date   NA 125 (L) 01/22/2024   K 3.4 (L) 01/22/2024   CL 94 (L) 01/22/2024   CO2 22 01/22/2024   GLUCOSE 117 (H) 01/22/2024   BUN 16 01/22/2024   CREATININE 1.44 (H) 01/22/2024   CALCIUM 8.2 (L) 01/22/2024   PROT 5.9 (L) 01/22/2024   ALBUMIN 3.0 (L) 01/22/2024   AST 26 01/22/2024   ALT 12 01/22/2024   ALKPHOS 69 01/22/2024   BILITOT 0.6 01/22/2024   GFRNONAA 49 (L) 01/22/2024   GFRAA 58 (L) 07/18/2019    Lab Results  Component Value Date   WBC 5.2 01/22/2024   NEUTROABS 3.2 01/22/2024   HGB 11.1 (L) 01/22/2024   HCT 33.0 (L) 01/22/2024   MCV 93.2 01/22/2024   PLT 258 01/22/2024     STUDIES: No results found.     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  Patient completed cycle 4 of carboplatinum, etoposide , and Tecentriq  on  May 19, 2023.  Repeat PET scan on June 07, 2023 with significant improvement of disease burden and patient continued with maintenance Tecentriq .  CT scan results from Sep 04, 2023 revealed progression of disease in both lungs and liver.  MRI of the brain on Sep 13, 2023 was negative for disease.  Hospice and end-of-life were briefly discussed, but patient wished to continue with treatment.  Tecentriq  has been discontinued.  Repeat CT scan on December 05, 2023 reviewed independently with decrease in size of lung and liver lesions.  We discussed delaying treatment 1 week and receiving IV fluids and electrolytes instead, but patient declined therefore we will proceed with cycle 7 of lurbinectedin  today.  Return to clinic in 3 weeks for further evaluation and consideration of cycle 8.  Will reimage in approximately November 2025.   Clinical stage IIB squamous cell carcinoma, left upper lobe lung:  Patient underwent biopsy with navigational bronchoscopy on October 19, 2018 confirming diagnosis.  Patient declined surgery and wished to pursue XRT along with concurrent chemotherapy.  Given his stage of disease he did not receive maintenance immunotherapy.  He completed cycle 7 of weekly carboplatinum and Taxol  on January 09, 2019 and then completed XRT on January 14, 2019.   History of melanoma: Unclear stage or depth, although by report was in situ.  Patient had Mohs surgery in fall 2019.  Previous lung biopsy consistent with squamous cell carcinoma. Anxiety/depression: Improved.  Continue current medications as prescribed.  Continue follow-up with primary care physician as well as psychiatry at the [facility]. Shortness of breath: Chronic and unchanged.  Patient's most recent thoracentesis on Sep 08, 2023 removed 1.4 L of fluid.  Continue supplemental oxygen as needed.  Appreciate pulmonary input. Pain: Patient does not complain of this today.  Continue oxycodone  as needed. Peripheral edema: Chronic and unchanged.   Patient has been instructed to increase his torsemide  to daily if his blood pressure remains above 110 systolic. Hyponatremia: Sodium level is mildly improved to 128.  Monitor. Renal insufficiency: Chronic and unchanged.  Patient's creatinine is 1.28.   Hypomagnesia: Magnesium  decreased, but mildly improved to 1.6.  Monitor.   Hyponatremia: Patient's sodium level has trended down to 129, monitor. Hypokalemia: Mild.  Patient's potassium level is 3.4.  Patient was  given dietary changes. Hypomagnesia: Mild.  Patient's magnesium  level is 1.5 today.  Continue oral magnesium  supplementation. Anemia: Hemoglobin has trended down slightly to 11.1. Dizziness/nausea: Patient does not complain of this today.  Patient expressed understanding and was in agreement with this plan. He also understands that He can call clinic at any time with any questions, concerns, or complaints.    Cancer Staging  Small cell lung cancer, left [facility]) Staging form: Lung, AJCC 8th Edition - Clinical stage from 02/24/2023: Stage IVA (cT4, cN2, cM1a) - Signed by [name] [name], MD on 02/24/2023  Squamous cell carcinoma lung, left (HCC) Staging form: Lung, AJCC 8th Edition - Clinical stage from 10/27/2018: Stage IIB (cT1c, cN1, cM0) - Signed by [name] [name], MD on 11/16/2018   [name] [name], MD   01/22/2024 9:17 AM

## 2024-01-22 NOTE — Progress Notes (Signed)
 Patient is still feeling nauseas and dizzy.

## 2024-01-22 NOTE — Patient Instructions (Signed)
 [facility] - A DEPT OF [facility]  Discharge Instructions: Thank you for choosing [facility] to provide your oncology and hematology care.  If you have a lab appointment with the Cancer Center, please go directly to the Cancer Center and check in at the registration area.  Wear comfortable clothing and clothing appropriate for easy access to any Portacath or PICC line.   We strive to give you quality time with your provider. You may need to reschedule your appointment if you arrive late (15 or more minutes).  Arriving late affects you and other patients whose appointments are after yours.  Also, if you miss three or more appointments without notifying the office, you may be dismissed from the clinic at the provider's discretion.      For prescription refill requests, have your pharmacy contact our office and allow 72 hours for refills to be completed.    Today you received the following chemotherapy and/or immunotherapy agents lurbinectedin       To help prevent nausea and vomiting after your treatment, we encourage you to take your nausea medication as directed.  BELOW ARE SYMPTOMS THAT SHOULD BE REPORTED IMMEDIATELY: *FEVER GREATER THAN 100.4 F (38 C) OR HIGHER *CHILLS OR SWEATING *NAUSEA AND VOMITING THAT IS NOT CONTROLLED WITH YOUR NAUSEA MEDICATION *UNUSUAL SHORTNESS OF BREATH *UNUSUAL BRUISING OR BLEEDING *URINARY PROBLEMS (pain or burning when urinating, or frequent urination) *BOWEL PROBLEMS (unusual diarrhea, constipation, pain near the anus) TENDERNESS IN MOUTH AND THROAT WITH OR WITHOUT PRESENCE OF ULCERS (sore throat, sores in mouth, or a toothache) UNUSUAL RASH, SWELLING OR PAIN  UNUSUAL VAGINAL DISCHARGE OR ITCHING   Items with * indicate a potential emergency and should be followed up as soon as possible or go to the Emergency Department if any problems should occur.  Please show the CHEMOTHERAPY ALERT CARD or  IMMUNOTHERAPY ALERT CARD at check-in to the Emergency Department and triage nurse.  Should you have questions after your visit or need to cancel or reschedule your appointment, please contact [facility] - A DEPT OF [facility]  [phone] and follow the prompts.  Office hours are 8:00 a.m. to 4:30 p.m. Monday - Friday. Please note that voicemails left after 4:00 p.m. may not be returned until the following business day.  We are closed weekends and major holidays. You have access to a nurse at all times for urgent questions. Please call the main number to the clinic [phone] and follow the prompts.  For any non-urgent questions, you may also contact your provider using MyChart. We now offer e-Visits for anyone 29 and older to request care online for non-urgent symptoms. For details visit [email or web address].   Also download the MyChart app! Go to the app store, search "MyChart", open the app, select [place], and log in with your MyChart username and password.

## 2024-01-30 ENCOUNTER — Inpatient Hospital Stay: Admitting: Hospice and Palliative Medicine

## 2024-01-30 DIAGNOSIS — C3492 Malignant neoplasm of unspecified part of left bronchus or lung: Secondary | ICD-10-CM | POA: Diagnosis not present

## 2024-01-30 DIAGNOSIS — Z515 Encounter for palliative care: Secondary | ICD-10-CM

## 2024-01-30 NOTE — Progress Notes (Signed)
 Virtual Visit via Telephone Note  I connected with [name] on 01/30/24 at  1:00 PM EDT by telephone and verified that I am speaking with the correct person using two identifiers.  Location: Patient: Home Provider: Clinic   I discussed the limitations, risks, security and privacy concerns of performing an evaluation and management service by telephone and the availability of in person appointments. I also discussed with the patient that there may be a patient responsible charge related to this service. The patient expressed understanding and agreed to proceed.   History of Present Illness: KAEGAN HETTICH is a 83 y.o. 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 Patient was referred to palliative care to address goals manage ongoing symptoms.    Observations/Objective: I spoke with patient and wife by phone.   Patient reports he is doing well.  Denies any significant changes or concerns.  Remains fairly immobile, only walking 2 bathroom and back to chair.  Sleeps in a recliner.  No significant pain reported.  Patient does feel fatigued and nauseated with treatments.  Takes antiemetic with good effect.  Appetite is reportedly stable.  CT in August 2025 showed interval disease improvement reflecting treatment response.  Assessment and Plan: Stage IVa extensive stage small cell lung cancer -appears to be doing well.  Now on lurbinectedin .  No significant symptomatic complaints or concerns today.  Will follow.   Follow Up Instructions: Follow-up telephone visit to 2-3 months   I discussed the assessment and treatment plan with the patient. The patient was provided an opportunity to ask questions and all were answered. The patient agreed with the plan and  demonstrated an understanding of the instructions.   The patient was advised to call back or seek an in-person evaluation if the symptoms worsen or if the condition fails to improve as anticipated.  I provided 10 minutes of non-face-to-face time during this encounter.   FONDA JONELLE MOWER, NP

## 2024-02-12 ENCOUNTER — Inpatient Hospital Stay: Admitting: Oncology

## 2024-02-12 ENCOUNTER — Encounter: Payer: Self-pay | Admitting: Oncology

## 2024-02-12 ENCOUNTER — Inpatient Hospital Stay

## 2024-02-12 ENCOUNTER — Inpatient Hospital Stay: Attending: Oncology

## 2024-02-12 VITALS — BP 123/67 | HR 50

## 2024-02-12 VITALS — BP 127/62 | HR 77 | Temp 97.9°F | Resp 16 | Ht 72.0 in | Wt 252.0 lb

## 2024-02-12 DIAGNOSIS — Z79630 Long term (current) use of alkylating agent: Secondary | ICD-10-CM | POA: Diagnosis not present

## 2024-02-12 DIAGNOSIS — C3492 Malignant neoplasm of unspecified part of left bronchus or lung: Secondary | ICD-10-CM

## 2024-02-12 DIAGNOSIS — C787 Secondary malignant neoplasm of liver and intrahepatic bile duct: Secondary | ICD-10-CM | POA: Insufficient documentation

## 2024-02-12 DIAGNOSIS — C3412 Malignant neoplasm of upper lobe, left bronchus or lung: Secondary | ICD-10-CM | POA: Diagnosis present

## 2024-02-12 DIAGNOSIS — Z5111 Encounter for antineoplastic chemotherapy: Secondary | ICD-10-CM | POA: Insufficient documentation

## 2024-02-12 LAB — CBC WITH DIFFERENTIAL (CANCER CENTER ONLY)
Abs Immature Granulocytes: 0.08 K/uL — ABNORMAL HIGH (ref 0.00–0.07)
Basophils Absolute: 0 K/uL (ref 0.0–0.1)
Basophils Relative: 1 %
Eosinophils Absolute: 0.1 K/uL (ref 0.0–0.5)
Eosinophils Relative: 1 %
HCT: 33.6 % — ABNORMAL LOW (ref 39.0–52.0)
Hemoglobin: 11.6 g/dL — ABNORMAL LOW (ref 13.0–17.0)
Immature Granulocytes: 2 %
Lymphocytes Relative: 8 %
Lymphs Abs: 0.4 K/uL — ABNORMAL LOW (ref 0.7–4.0)
MCH: 31.7 pg (ref 26.0–34.0)
MCHC: 34.5 g/dL (ref 30.0–36.0)
MCV: 91.8 fL (ref 80.0–100.0)
Monocytes Absolute: 0.9 K/uL (ref 0.1–1.0)
Monocytes Relative: 17 %
Neutro Abs: 3.6 K/uL (ref 1.7–7.7)
Neutrophils Relative %: 71 %
Platelet Count: 215 K/uL (ref 150–400)
RBC: 3.66 MIL/uL — ABNORMAL LOW (ref 4.22–5.81)
RDW: 16.3 % — ABNORMAL HIGH (ref 11.5–15.5)
WBC Count: 5.1 K/uL (ref 4.0–10.5)
nRBC: 0 % (ref 0.0–0.2)

## 2024-02-12 LAB — CMP (CANCER CENTER ONLY)
ALT: 11 U/L (ref 0–44)
AST: 24 U/L (ref 15–41)
Albumin: 3 g/dL — ABNORMAL LOW (ref 3.5–5.0)
Alkaline Phosphatase: 69 U/L (ref 38–126)
Anion gap: 8 (ref 5–15)
BUN: 18 mg/dL (ref 8–23)
CO2: 21 mmol/L — ABNORMAL LOW (ref 22–32)
Calcium: 8.5 mg/dL — ABNORMAL LOW (ref 8.9–10.3)
Chloride: 90 mmol/L — ABNORMAL LOW (ref 98–111)
Creatinine: 1.23 mg/dL (ref 0.61–1.24)
GFR, Estimated: 59 mL/min — ABNORMAL LOW (ref >60)
Glucose, Bld: 112 mg/dL — ABNORMAL HIGH (ref 70–99)
Potassium: 4.3 mmol/L (ref 3.5–5.1)
Sodium: 120 mmol/L — ABNORMAL LOW (ref 135–145)
Total Bilirubin: 0.8 mg/dL (ref 0.0–1.2)
Total Protein: 6.1 g/dL — ABNORMAL LOW (ref 6.5–8.1)

## 2024-02-12 LAB — MAGNESIUM: Magnesium: 1.7 mg/dL (ref 1.7–2.4)

## 2024-02-12 MED ORDER — PALONOSETRON HCL INJECTION 0.25 MG/5ML
0.2500 mg | Freq: Once | INTRAVENOUS | Status: AC
  Administered 2024-02-12: 0.25 mg via INTRAVENOUS
  Filled 2024-02-12: qty 5

## 2024-02-12 MED ORDER — SODIUM CHLORIDE 0.9 % IV SOLN
INTRAVENOUS | Status: DC
  Filled 2024-02-12: qty 250

## 2024-02-12 MED ORDER — SODIUM CHLORIDE 1 G PO TABS: 1.0000 g | ORAL_TABLET | Freq: Three times a day (TID) | ORAL | 1 refills | Status: DC

## 2024-02-12 MED ORDER — DEXAMETHASONE SOD PHOSPHATE PF 10 MG/ML IJ SOLN
10.0000 mg | Freq: Once | INTRAMUSCULAR | Status: AC
  Administered 2024-02-12: 10 mg via INTRAVENOUS

## 2024-02-12 MED ORDER — SODIUM CHLORIDE 0.9 % IV SOLN
3.2000 mg/m2 | Freq: Once | INTRAVENOUS | Status: AC
  Administered 2024-02-12: 7.65 mg via INTRAVENOUS
  Filled 2024-02-12: qty 15.3

## 2024-02-12 NOTE — Progress Notes (Signed)
 Patient has been coughing up stuff for a while now, but this morning he said what he coughed up was reddish color, which he told me that he does drink cool-aid so that could have been it.

## 2024-02-12 NOTE — Progress Notes (Signed)
 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  INTERVAL HISTORY: Patient returns to clinic today for further evaluation and consideration of cycle 8 of Lurbinectedin .  He continues to have chronic weakness and fatigue.  He also has increased congestion without fever today.  He otherwise feels well.  He has no neurologic complaints.  He denies any chest pain, cough, or hemoptysis.  He has a good appetite and denies weight loss.  He denies any nausea, vomiting, constipation, or diarrhea.  He has no urinary complaints.  Patient offers no further specific complaints today.  REVIEW OF SYSTEMS:   Review of Systems  Constitutional:  Positive for malaise/fatigue. Negative for fever and weight loss.  HENT:  Positive for congestion.   Respiratory:  Positive for shortness of breath. Negative for cough and hemoptysis.   Cardiovascular: Negative.  Negative for chest pain and leg swelling.  Gastrointestinal: Negative.  Negative for abdominal pain and nausea.  Genitourinary: Negative.  Negative for dysuria.  Musculoskeletal: Negative.  Negative for back pain.  Skin:  Negative for rash.  Neurological:  Positive for weakness. Negative for dizziness, focal weakness and headaches.  Endo/Heme/Allergies:  Does not bruise/bleed easily.  Psychiatric/Behavioral: Negative.  Negative for depression. The patient is not nervous/anxious.     As per HPI. Otherwise, a complete review of  systems is negative.  PAST MEDICAL HISTORY: Past Medical History:  Diagnosis Date   Asthma    COPD (chronic obstructive pulmonary disease) (HCC)    Hearing loss    Hypertension    Hypothyroidism    Mass of left lung    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HCC)    Prostate enlargement    Shortness of breath    Squamous cell carcinoma of lung, left (HCC)    Thyroid  disease    Tobacco abuse     PAST SURGICAL HISTORY: Past Surgical History:  Procedure Laterality Date   CARDIOVERSION N/A    CARDIOVERSION N/A 11/24/2021   Procedure: CARDIOVERSION;  Surgeon: Mady Bruckner, MD;  Location: [facility] ORS;  Service: Cardiovascular;  Laterality: N/A;   ELECTROMAGNETIC NAVIGATION BROCHOSCOPY Left 10/19/2018   Procedure: ELECTROMAGNETIC NAVIGATION BRONCHOSCOPY LEFT;  Surgeon: Tamea Dedra CROME, MD;  Location: [facility] ORS;  Service: Cardiopulmonary;  Laterality: Left;   ENDOBRONCHIAL ULTRASOUND Left 10/19/2018   Procedure: ENDOBRONCHIAL ULTRASOUND LEFT;  Surgeon: Tamea Dedra CROME, MD;  Location: [facility] ORS;  Service: Cardiopulmonary;  Laterality: Left;   IR IMAGING GUIDED PORT INSERTION  03/03/2023   IR THORACENTESIS ASP PLEURAL SPACE W/IMG GUIDE  03/01/2023   MELANOMA EXCISION Left    NO PAST SURGERIES      FAMILY HISTORY: Family History  Problem Relation Age of Onset   Aneurysm Mother    Cancer Father     ADVANCED DIRECTIVES (Y/N):  N  HEALTH MAINTENANCE: Social History   Tobacco Use   Smoking status: Former    Current packs/day: 0.00    Average packs/day: 0.3 packs/day for 62.0 years (15.5 ttl pk-yrs)    Types: Cigarettes    Start date: 09/04/1956  Quit date: 09/05/2018    Years since quitting: 5.4   Smokeless tobacco: Never  Vaping Use   Vaping status: Never Used  Substance Use Topics   Alcohol use: No    Alcohol/week: 0.0 standard drinks of alcohol   Drug use: No     Colonoscopy:  PAP:  Bone density:  Lipid panel:  No Known Allergies  Current Outpatient Medications   Medication Sig Dispense Refill   albuterol  (PROVENTIL  HFA;VENTOLIN  HFA) 108 (90 Base) MCG/ACT inhaler Inhale 2 puffs into the lungs every 6 (six) hours as needed for wheezing or shortness of breath. 1 Inhaler 0   albuterol  (PROVENTIL ) (2.5 MG/3ML) 0.083% nebulizer solution Take 2.5 mg by nebulization every 6 (six) hours as needed for wheezing or shortness of breath.     apixaban  (ELIQUIS ) 5 MG TABS tablet Take 1 tablet (5 mg total) by mouth 2 (two) times daily. 60 tablet 3   carboxymethylcellulose (REFRESH PLUS) 0.5 % SOLN 1 drop 4 (four) times daily as needed.     chlorpheniramine-HYDROcodone  (TUSSIONEX) 10-8 MG/5ML Take 5 mLs by mouth every 12 (twelve) hours as needed for cough. 115 mL 0   finasteride (PROSCAR) 5 MG tablet Take 5 mg by mouth daily.     fluticasone-salmeterol (ADVAIR) 250-50 MCG/ACT AEPB Inhale 1 puff into the lungs in the morning and at bedtime.     glimepiride  (AMARYL ) 2 MG tablet Take 2 mg by mouth daily with breakfast.     ipratropium-albuterol  (DUONEB) 0.5-2.5 (3) MG/3ML SOLN Take 3 mLs by nebulization every 6 (six) hours as needed. 360 mL 3   levothyroxine  (SYNTHROID ) 200 MCG tablet Take 200 mcg by mouth daily before breakfast.     Magnesium  Glycinate 120 MG CAPS Take 400 mg by mouth.     Oxycodone  HCl 10 MG TABS Take 1 tablet (10 mg total) by mouth every 6 (six) hours as needed. Okay to cut tablet in half if needed. 60 tablet 0   OXYGEN Inhale 3 L into the lungs continuous.     potassium chloride  SA (KLOR-CON  M) 20 MEQ tablet TAKE 1 TABLET BY MOUTH TWICE A DAY 180 tablet 1   prochlorperazine  (COMPAZINE ) 10 MG tablet Take 10 mg by mouth every 6 (six) hours as needed.     SEMAGLUTIDE,0.25 OR 0.5MG /DOS, [place] Inject 0.5 mg into the skin once a week.     sodium chloride  1 g tablet Take 1 tablet (1 g total) by mouth 3 (three) times daily. 90 tablet 1   tamsulosin  (FLOMAX ) 0.4 MG CAPS capsule 0.8 mg daily.     Tiotropium Bromide Monohydrate  (SPIRIVA  RESPIMAT) 2.5 MCG/ACT AERS  Inhale 2 puffs into the lungs daily. 1 each 11   torsemide  (DEMADEX ) 20 MG tablet Take 1 tablet (20 mg total) by mouth daily. 30 tablet 0   vitamin B-12 (CYANOCOBALAMIN ) 1000 MCG tablet Take 1,000 mcg by mouth daily.     No current facility-administered medications for this visit.   Facility-Administered Medications Ordered in Other Visits  Medication Dose Route Frequency Provider Last Rate Last Admin   0.9 %  sodium chloride  infusion   Intravenous Continuous Venba Zenner J, MD 10 mL/hr at 02/12/24 1113 New Bag at 02/12/24 1113   lurbinectedin  (ZEPZELCA ) 7.65 mg in sodium chloride  0.9 % 250 mL chemo infusion  3.2 mg/m2 (Treatment Plan Recorded) Intravenous Once Kippy Gohman J, MD 265 mL/hr at 02/12/24 1159 7.65 mg at 02/12/24 1159    OBJECTIVE: Vitals:   02/12/24 1026  BP: 127/62  Pulse:  77  Resp: 16  Temp: 97.9 F (36.6 C)  SpO2: 99%       Body mass index is 34.18 kg/m.    ECOG FS:1 - Symptomatic but completely ambulatory  General: Well-developed, well-nourished, no acute distress.  Sitting in a wheelchair. Eyes: Pink conjunctiva, anicteric sclera. HEENT: Normocephalic, moist mucous membranes. Lungs: No audible wheezing or coughing. Heart: Regular rate and rhythm. Abdomen: Soft, nontender, no obvious distention. Musculoskeletal: No edema, cyanosis, or clubbing. Neuro: Alert, answering all questions appropriately. Cranial nerves grossly intact. Skin: No rashes or petechiae noted. Psych: Normal affect.  LAB RESULTS:  Lab Results  Component Value Date   NA 120 (L) 02/12/2024   K 4.3 02/12/2024   CL 90 (L) 02/12/2024   CO2 21 (L) 02/12/2024   GLUCOSE 112 (H) 02/12/2024   BUN 18 02/12/2024   CREATININE 1.23 02/12/2024   CALCIUM 8.5 (L) 02/12/2024   PROT 6.1 (L) 02/12/2024   ALBUMIN 3.0 (L) 02/12/2024   AST 24 02/12/2024   ALT 11 02/12/2024   ALKPHOS 69 02/12/2024   BILITOT 0.8 02/12/2024   GFRNONAA 59 (L) 02/12/2024   GFRAA 58 (L) 07/18/2019    Lab  Results  Component Value Date   WBC 5.1 02/12/2024   NEUTROABS 3.6 02/12/2024   HGB 11.6 (L) 02/12/2024   HCT 33.6 (L) 02/12/2024   MCV 91.8 02/12/2024   PLT 215 02/12/2024     STUDIES: No results found.  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  Patient completed cycle 4 of carboplatinum, etoposide , and Tecentriq  on May 19, 2023.  Repeat PET scan on June 07, 2023 with significant improvement of disease burden and patient continued with maintenance Tecentriq .  CT scan results from Sep 04, 2023 revealed progression of disease in both lungs and liver.  MRI of the brain on Sep 13, 2023 was negative for disease.  Hospice and end-of-life were briefly discussed, but patient wished to continue with treatment.  Tecentriq  has been discontinued.  Repeat CT scan on December 05, 2023 reviewed independently with decrease in size of lung and liver lesions.  Proceed with cycle 8 of lurbinectedin  today.  Return to clinic in 3 weeks for further evaluation and consideration of cycle 9.  Will reimage in approximately November 2025.   Clinical stage IIB squamous cell carcinoma, left upper lobe lung:  Patient underwent biopsy with navigational bronchoscopy on October 19, 2018 confirming diagnosis.  Patient declined surgery and wished to pursue XRT along with concurrent chemotherapy.  Given his stage of disease he did not receive maintenance immunotherapy.  He completed cycle 7 of weekly carboplatinum and Taxol  on January 09, 2019 and then completed XRT on January 14, 2019.   History of melanoma: Unclear stage or depth, although by report was in situ.  Patient had Mohs surgery in fall 2019.  Previous lung biopsy consistent with squamous cell carcinoma. Anxiety/depression: Improved.  Continue current medications as prescribed.  Continue follow-up with primary care  physician as well as psychiatry at the [facility]. Shortness of breath: Chronic and unchanged.  Patient's most recent thoracentesis on Sep 08, 2023 removed 1.4 L of fluid.  Continue supplemental oxygen as needed.  Appreciate pulmonary input. Pain: Patient does not complain of this today.  Continue oxycodone  as needed. Peripheral edema: Chronic and unchanged.  Patient has been instructed to increase his torsemide  to daily if  his blood pressure remains above 110 systolic. Hyponatremia: Sodium level has decreased from 120.  He was given a prescription for salt tablets.  Suspect SIADH. Renal insufficiency: Patient's creatinine is within normal limits today. Hypomagnesia: Resolved.   Hypokalemia: Resolved. Hypomagnesia: Resolved. Anemia: Chronic and unchanged. Hemoglobin  is 11.6 today.  Dizziness/nausea: Patient does not complain of this today. Congestion: Continue OTC treatments.  Patient expressed understanding and was in agreement with this plan. He also understands that He can call clinic at any time with any questions, concerns, or complaints.    Cancer Staging  Small cell lung cancer, left [facility]) Staging form: Lung, AJCC 8th Edition - Clinical stage from 02/24/2023: Stage IVA (cT4, cN2, cM1a) - Signed by [name] [name], MD on 02/24/2023  Squamous cell carcinoma lung, left (HCC) Staging form: Lung, AJCC 8th Edition - Clinical stage from 10/27/2018: Stage IIB (cT1c, cN1, cM0) - Signed by [name] [name], MD on 11/16/2018   [name] [name], MD   02/12/2024 12:31 PM

## 2024-02-13 ENCOUNTER — Other Ambulatory Visit: Payer: Self-pay | Admitting: *Deleted

## 2024-02-13 MED ORDER — SODIUM CHLORIDE 1 G PO TABS: 1.0000 g | ORAL_TABLET | Freq: Three times a day (TID) | ORAL | 1 refills | Status: AC

## 2024-02-19 ENCOUNTER — Other Ambulatory Visit: Payer: Self-pay | Admitting: *Deleted

## 2024-02-19 MED ORDER — APIXABAN 2.5 MG PO TABS: 2.5000 mg | ORAL_TABLET | Freq: Two times a day (BID) | ORAL | Status: AC

## 2024-02-19 NOTE — Progress Notes (Deleted)
     Electrophysiology Clinic Note    Date:  02/19/2024  Patient ID:  Levi [name], Levi [name] 11-Feb-1941, MRN [id number] PCP:  [facility]  Cardiologist:  Redell Cave, MD  Electrophysiologist:  Elspeth Sage, MD    ***refresh  Discussed the use of AI scribe software for clinical note transcription with the patient, who gave verbal consent to proceed.   Patient Profile    Chief Complaint: ***  History of Present Illness: Levi [name] is a 83 y.o. male with PMH notable for perm AFib, squamous cell Lung Ca, COPD on home Os, HTN ; seen today for Elspeth Sage, MD for routine electrophysiology followup.   I last saw him 07/2023 where his exercise ability was slightly improved, but continued to have severe SOB.   Since last being seen in our clinic the patient reports doing ***.  he denies chest pain, palpitations, dyspnea, PND, orthopnea, nausea, vomiting, dizziness, syncope, edema, weight gain, or early satiety.      Arrhythmia/Device History Multaq  - ineffective    ROS:  Please see the history of present illness. All other systems are reviewed and otherwise negative.    Physical Exam    VS:  There were no vitals taken for this visit. BMI: There is no height or weight on file to calculate BMI.           Wt Readings from Last 3 Encounters:  02/12/24 252 lb (114.3 kg)  01/22/24 251 lb (113.9 kg)  01/02/24 248 lb (112.5 kg)     GEN- The patient is well appearing, alert and oriented x 3 today.   Lungs- Clear to ausculation bilaterally, normal work of breathing.  Heart- {Blank single:19197::Regular,Irregularly irregular} rate and rhythm, no murmurs, rubs or gallops Extremities- {EDEMA LEVEL:28147::No} peripheral edema, warm, dry Skin-  *** device pocket well-healed, no tethering   *** Brief check performed without iterative lead testing/measurements   Device interrogation done today and reviewed by myself:  Battery *** Lead thresholds,  impedence, sensing stable *** *** episodes *** changes made today   Studies Reviewed   Previous EP, cardiology notes.    EKG {ACTION; IS/IS WNU:[id number]} ordered. Personal review of EKG from {Blank single:19197::today,***} shows:  ***       NM Myocardial spect, 02/15/2022   Normal pharmacologic myocardial perfusion stress test without evidence of significant ischemia or scar.   Left ventricular systolic function is normal (LVEF 55-65%).   Coronary artery calcification and aortic atherosclerosis are noted on the attenuation correction CT.   Post-cancer treatment changes are noted in the left lung, better evaluated on dedicated chest CT dated 01/31/2022.   This is a low-risk study.      Assessment and Plan     #) ***   #) ***   {Are you ordering a CV Procedure (e.g. stress test, cath, DCCV, TEE, etc)?   Press F2        :[id number]}   Current medicines are reviewed at length with the patient today.   The patient {ACTIONS; HAS/DOES NOT HAVE:19233} concerns regarding his medicines.  The following changes were made today:  {NONE DEFAULTED:18576}  Labs/ tests ordered today include: *** No orders of the defined types were placed in this encounter.    Disposition: Follow up with {EPMDS:28135::EP Team} or EP APP {EPFOLLOW UP:28173}   Signed, Chantal Needle, NP  02/19/24  4:18 PM  Electrophysiology [facility]

## 2024-02-20 ENCOUNTER — Ambulatory Visit: Admitting: Cardiology

## 2024-03-01 ENCOUNTER — Encounter: Payer: Self-pay | Admitting: Oncology

## 2024-03-04 ENCOUNTER — Inpatient Hospital Stay

## 2024-03-04 ENCOUNTER — Inpatient Hospital Stay (HOSPITAL_BASED_OUTPATIENT_CLINIC_OR_DEPARTMENT_OTHER): Admitting: Oncology

## 2024-03-04 ENCOUNTER — Encounter: Payer: Self-pay | Admitting: Oncology

## 2024-03-04 ENCOUNTER — Inpatient Hospital Stay: Attending: Oncology

## 2024-03-04 VITALS — BP 143/64 | HR 74 | Temp 98.0°F | Resp 18 | Ht 72.0 in | Wt 245.0 lb

## 2024-03-04 VITALS — BP 138/68 | HR 71

## 2024-03-04 DIAGNOSIS — Z87891 Personal history of nicotine dependence: Secondary | ICD-10-CM | POA: Diagnosis not present

## 2024-03-04 DIAGNOSIS — Z9981 Dependence on supplemental oxygen: Secondary | ICD-10-CM | POA: Diagnosis not present

## 2024-03-04 DIAGNOSIS — I4891 Unspecified atrial fibrillation: Secondary | ICD-10-CM | POA: Diagnosis not present

## 2024-03-04 DIAGNOSIS — C3412 Malignant neoplasm of upper lobe, left bronchus or lung: Secondary | ICD-10-CM | POA: Diagnosis present

## 2024-03-04 DIAGNOSIS — C787 Secondary malignant neoplasm of liver and intrahepatic bile duct: Secondary | ICD-10-CM | POA: Diagnosis present

## 2024-03-04 DIAGNOSIS — R600 Localized edema: Secondary | ICD-10-CM | POA: Insufficient documentation

## 2024-03-04 DIAGNOSIS — R0602 Shortness of breath: Secondary | ICD-10-CM | POA: Insufficient documentation

## 2024-03-04 DIAGNOSIS — D649 Anemia, unspecified: Secondary | ICD-10-CM | POA: Diagnosis not present

## 2024-03-04 DIAGNOSIS — Z809 Family history of malignant neoplasm, unspecified: Secondary | ICD-10-CM | POA: Insufficient documentation

## 2024-03-04 DIAGNOSIS — Z5111 Encounter for antineoplastic chemotherapy: Secondary | ICD-10-CM | POA: Diagnosis present

## 2024-03-04 DIAGNOSIS — C3492 Malignant neoplasm of unspecified part of left bronchus or lung: Secondary | ICD-10-CM | POA: Diagnosis not present

## 2024-03-04 DIAGNOSIS — Z7901 Long term (current) use of anticoagulants: Secondary | ICD-10-CM | POA: Diagnosis not present

## 2024-03-04 DIAGNOSIS — E871 Hypo-osmolality and hyponatremia: Secondary | ICD-10-CM | POA: Insufficient documentation

## 2024-03-04 DIAGNOSIS — F419 Anxiety disorder, unspecified: Secondary | ICD-10-CM | POA: Insufficient documentation

## 2024-03-04 DIAGNOSIS — F32A Depression, unspecified: Secondary | ICD-10-CM | POA: Diagnosis not present

## 2024-03-04 DIAGNOSIS — Z79630 Long term (current) use of alkylating agent: Secondary | ICD-10-CM | POA: Insufficient documentation

## 2024-03-04 DIAGNOSIS — Z86007 Personal history of in-situ neoplasm of skin: Secondary | ICD-10-CM | POA: Insufficient documentation

## 2024-03-04 LAB — CBC WITH DIFFERENTIAL (CANCER CENTER ONLY)
Abs Immature Granulocytes: 0.05 K/uL (ref 0.00–0.07)
Basophils Absolute: 0 K/uL (ref 0.0–0.1)
Basophils Relative: 1 %
Eosinophils Absolute: 0.1 K/uL (ref 0.0–0.5)
Eosinophils Relative: 1 %
HCT: 34.4 % — ABNORMAL LOW (ref 39.0–52.0)
Hemoglobin: 11.4 g/dL — ABNORMAL LOW (ref 13.0–17.0)
Immature Granulocytes: 1 %
Lymphocytes Relative: 12 %
Lymphs Abs: 0.5 K/uL — ABNORMAL LOW (ref 0.7–4.0)
MCH: 31.6 pg (ref 26.0–34.0)
MCHC: 33.1 g/dL (ref 30.0–36.0)
MCV: 95.3 fL (ref 80.0–100.0)
Monocytes Absolute: 0.9 K/uL (ref 0.1–1.0)
Monocytes Relative: 21 %
Neutro Abs: 2.8 K/uL (ref 1.7–7.7)
Neutrophils Relative %: 64 %
Platelet Count: 202 K/uL (ref 150–400)
RBC: 3.61 MIL/uL — ABNORMAL LOW (ref 4.22–5.81)
RDW: 16 % — ABNORMAL HIGH (ref 11.5–15.5)
WBC Count: 4.3 K/uL (ref 4.0–10.5)
nRBC: 0 % (ref 0.0–0.2)

## 2024-03-04 LAB — CMP (CANCER CENTER ONLY)
ALT: 11 U/L (ref 0–44)
AST: 20 U/L (ref 15–41)
Albumin: 3.1 g/dL — ABNORMAL LOW (ref 3.5–5.0)
Alkaline Phosphatase: 72 U/L (ref 38–126)
Anion gap: 8 (ref 5–15)
BUN: 16 mg/dL (ref 8–23)
CO2: 20 mmol/L — ABNORMAL LOW (ref 22–32)
Calcium: 8.4 mg/dL — ABNORMAL LOW (ref 8.9–10.3)
Chloride: 101 mmol/L (ref 98–111)
Creatinine: 1.16 mg/dL (ref 0.61–1.24)
GFR, Estimated: >60 mL/min (ref >60)
Glucose, Bld: 120 mg/dL — ABNORMAL HIGH (ref 70–99)
Potassium: 3.8 mmol/L (ref 3.5–5.1)
Sodium: 129 mmol/L — ABNORMAL LOW (ref 135–145)
Total Bilirubin: 0.7 mg/dL (ref 0.0–1.2)
Total Protein: 6 g/dL — ABNORMAL LOW (ref 6.5–8.1)

## 2024-03-04 LAB — MAGNESIUM: Magnesium: 1.6 mg/dL — ABNORMAL LOW (ref 1.7–2.4)

## 2024-03-04 MED ORDER — DEXAMETHASONE SOD PHOSPHATE PF 10 MG/ML IJ SOLN
10.0000 mg | Freq: Once | INTRAMUSCULAR | Status: AC
  Administered 2024-03-04: 10 mg via INTRAVENOUS

## 2024-03-04 MED ORDER — SODIUM CHLORIDE 0.9 % IV SOLN
INTRAVENOUS | Status: DC
  Filled 2024-03-04: qty 250

## 2024-03-04 MED ORDER — PALONOSETRON HCL INJECTION 0.25 MG/5ML
0.2500 mg | Freq: Once | INTRAVENOUS | Status: AC
  Administered 2024-03-04: 0.25 mg via INTRAVENOUS
  Filled 2024-03-04: qty 5

## 2024-03-04 MED ORDER — SODIUM CHLORIDE 0.9 % IV SOLN
3.2000 mg/m2 | Freq: Once | INTRAVENOUS | Status: AC
  Administered 2024-03-04: 7.65 mg via INTRAVENOUS
  Filled 2024-03-04: qty 15.3

## 2024-03-04 NOTE — Progress Notes (Signed)
 Patient has side pain sometimes ongoing for about a month or two, rates his pain is at a 8. Scan needs to be scheduled soon, by what the wife states.

## 2024-03-04 NOTE — Progress Notes (Signed)
 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  INTERVAL HISTORY: Patient returns to clinic today for further evaluation and consideration of cycle 9 of Lurbinectedin .  He continues to have chronic weakness and fatigue, but otherwise feels well and is tolerating his treatments without significant side effects.  He denies any recent fevers.  He has no neurologic complaints.  He denies any chest pain, cough, or hemoptysis.  He has chronic shortness of breath and requires supplemental oxygen.  He has a good appetite and denies weight loss.  He denies any nausea, vomiting, constipation, or diarrhea.  He has no urinary complaints.  Patient offers no further specific complaints today.    REVIEW OF SYSTEMS:   Review of Systems  Constitutional:  Positive for malaise/fatigue. Negative for fever and weight loss.  HENT:  Negative for congestion.   Respiratory:  Positive for shortness of breath. Negative for cough and hemoptysis.   Cardiovascular: Negative.  Negative for chest pain and leg swelling.  Gastrointestinal: Negative.  Negative for abdominal pain and nausea.  Genitourinary: Negative.  Negative for dysuria.  Musculoskeletal: Negative.  Negative for back pain.  Skin:  Negative for rash.  Neurological:  Positive for weakness. Negative for dizziness, focal weakness and headaches.  Endo/Heme/Allergies:  Does not bruise/bleed easily.  Psychiatric/Behavioral:  Negative.  Negative for depression. The patient is not nervous/anxious.     As per HPI. Otherwise, a complete review of systems is negative.  PAST MEDICAL HISTORY: Past Medical History:  Diagnosis Date   Asthma    COPD (chronic obstructive pulmonary disease) (HCC)    Hearing loss    Hypertension    Hypothyroidism    Mass of left lung    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HCC)    Prostate enlargement    Shortness of breath    Squamous cell carcinoma of lung, left (HCC)    Thyroid  disease    Tobacco abuse     PAST SURGICAL HISTORY: Past Surgical History:  Procedure Laterality Date   CARDIOVERSION N/A    CARDIOVERSION N/A 11/24/2021   Procedure: CARDIOVERSION;  Surgeon: Mady Bruckner, MD;  Location: [facility] ORS;  Service: Cardiovascular;  Laterality: N/A;   ELECTROMAGNETIC NAVIGATION BROCHOSCOPY Left 10/19/2018   Procedure: ELECTROMAGNETIC NAVIGATION BRONCHOSCOPY LEFT;  Surgeon: Tamea Dedra CROME, MD;  Location: [facility] OR[facility];  Service: Cardiopulmonary;  Laterality: Left;   ENDOBRONCHIAL ULTRASOUND Left 10/19/2018   Procedure: ENDOBRONCHIAL ULTRASOUND LEFT;  Surgeon: Tamea Dedra CROME, MD;  Location: [facility] OR[facility];  Service: Cardiopulmonary;  Laterality: Left;   IR IMAGING GUIDED PORT INSERTION  03/03/2023   IR THORACENTESIS ASP PLEURAL SPACE W/IMG GUIDE  03/01/2023   MELANOMA EXCISION Left    NO PAST SURGERIES      FAMILY HISTORY: Family History  Problem Relation Age of Onset   Aneurysm Mother    Cancer Father     ADVANCED DIRECTIVES (Y/N):  N  HEALTH MAINTENANCE: Social History   Tobacco Use   Smoking status: Former    Current packs/day: 0.00    Average packs/day: 0.3 packs/day  for 62.0 years (15.5 ttl pk-yrs)    Types: Cigarettes    Start date: 09/04/1956    Quit date: 09/05/2018    Years since quitting: 5.4   Smokeless tobacco: Never  Vaping Use   Vaping status: Never Used  Substance Use Topics   Alcohol use: No    Alcohol/week: 0.0 standard drinks of alcohol   Drug use: No      Colonoscopy:  PAP:  Bone density:  Lipid panel:  No Known Allergies  Current Outpatient Medications  Medication Sig Dispense Refill   albuterol  (PROVENTIL  HFA;VENTOLIN  HFA) 108 (90 Base) MCG/ACT inhaler Inhale 2 puffs into the lungs every 6 (six) hours as needed for wheezing or shortness of breath. 1 Inhaler 0   albuterol  (PROVENTIL ) (2.5 MG/3ML) 0.083% nebulizer solution Take 2.5 mg by nebulization every 6 (six) hours as needed for wheezing or shortness of breath.     apixaban  (ELIQUIS ) 2.5 MG TABS tablet Take 1 tablet (2.5 mg total) by mouth 2 (two) times daily.     carboxymethylcellulose (REFRESH PLUS) 0.5 % SOLN 1 drop 4 (four) times daily as needed.     chlorpheniramine-HYDROcodone  (TUSSIONEX) 10-8 MG/5ML Take 5 mLs by mouth every 12 (twelve) hours as needed for cough. 115 mL 0   finasteride (PROSCAR) 5 MG tablet Take 5 mg by mouth daily.     fluticasone-salmeterol (ADVAIR) 250-50 MCG/ACT AEPB Inhale 1 puff into the lungs in the morning and at bedtime.     glimepiride  (AMARYL ) 2 MG tablet Take 2 mg by mouth daily with breakfast.     ipratropium-albuterol  (DUONEB) 0.5-2.5 (3) MG/3ML SOLN Take 3 mLs by nebulization every 6 (six) hours as needed. 360 mL 3   levothyroxine  (SYNTHROID ) 200 MCG tablet Take 200 mcg by mouth daily before breakfast.     Magnesium  Glycinate 120 MG CAPS Take 400 mg by mouth.     Oxycodone  HCl 10 MG TABS Take 1 tablet (10 mg total) by mouth every 6 (six) hours as needed. Okay to cut tablet in half if needed. 60 tablet 0   OXYGEN Inhale 3 L into the lungs continuous.     potassium chloride  SA (KLOR-CON  M) 20 MEQ tablet TAKE 1 TABLET BY MOUTH TWICE A DAY 180 tablet 1   prochlorperazine  (COMPAZINE ) 10 MG tablet Take 10 mg by mouth every 6 (six) hours as needed.     SEMAGLUTIDE,0.25 OR 0.5MG /DOS, [place] Inject 0.5 mg into the skin once a week.     sodium chloride  1 g tablet Take 1 tablet (1 g total) by mouth 3 (three) times daily. 90 tablet 1   tamsulosin  (FLOMAX )  0.4 MG CAPS capsule 0.8 mg daily.     Tiotropium Bromide Monohydrate  (SPIRIVA  RESPIMAT) 2.5 MCG/ACT AERS Inhale 2 puffs into the lungs daily. 1 each 11   torsemide  (DEMADEX ) 20 MG tablet Take 1 tablet (20 mg total) by mouth daily. 30 tablet 0   vitamin B-12 (CYANOCOBALAMIN ) 1000 MCG tablet Take 1,000 mcg by mouth daily.     No current facility-administered medications for this visit.   Facility-Administered Medications Ordered in Other Visits  Medication Dose Route Frequency Provider Last Rate Last Admin   0.9 %  sodium chloride  infusion   Intravenous Continuous [name], Corretta Munce J, MD       dexamethasone  (DECADRON ) injection 10 mg  10 mg Intravenous Once Treyson Axel J, MD       lurbinectedin  (ZEPZELCA ) 7.65 mg in sodium chloride  0.9 % 250 mL chemo infusion  3.2 mg/m2 (  Treatment Plan Recorded) Intravenous Once Jarelis Ehlert J, MD       palonosetron  (ALOXI ) injection 0.25 mg  0.25 mg Intravenous Once [name] [name], MD        OBJECTIVE: Vitals:   03/04/24 0916  BP: (!) 143/64  Pulse: 74  Resp: 18  Temp: 98 F (36.7 C)  SpO2: 97%      Body mass index is 33.23 kg/m.    ECOG FS:1 - Symptomatic but completely ambulatory  General: Well-developed, well-nourished, no acute distress.  Sitting in a wheelchair. Eyes: Pink conjunctiva, anicteric sclera. HEENT: Normocephalic, moist mucous membranes. Lungs: No audible wheezing or coughing. Heart: Regular rate and rhythm. Abdomen: Soft, nontender, no obvious distention. Musculoskeletal: No edema, cyanosis, or clubbing. Neuro: Alert, answering all questions appropriately. Cranial nerves grossly intact. Skin: No rashes or petechiae noted. Psych: Normal affect.,  LAB RESULTS:  Lab Results  Component Value Date   NA 129 (L) 03/04/2024   K 3.8 03/04/2024   CL 101 03/04/2024   CO2 20 (L) 03/04/2024   GLUCOSE 120 (H) 03/04/2024   BUN 16 03/04/2024   CREATININE 1.16 03/04/2024   CALCIUM 8.4 (L) 03/04/2024   PROT 6.0 (L)  03/04/2024   ALBUMIN 3.1 (L) 03/04/2024   AST 20 03/04/2024   ALT 11 03/04/2024   ALKPHOS 72 03/04/2024   BILITOT 0.7 03/04/2024   GFRNONAA >60 03/04/2024   GFRAA 58 (L) 07/18/2019    Lab Results  Component Value Date   WBC 4.3 03/04/2024   NEUTROABS 2.8 03/04/2024   HGB 11.4 (L) 03/04/2024   HCT 34.4 (L) 03/04/2024   MCV 95.3 03/04/2024   PLT 202 03/04/2024     STUDIES: No results found.  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  Patient completed cycle 4 of carboplatinum, etoposide , and Tecentriq  on May 19, 2023.  Repeat PET scan on June 07, 2023 with significant improvement of disease burden and patient continued with maintenance Tecentriq .  CT scan results from Sep 04, 2023 revealed progression of disease in both lungs and liver.  MRI of the brain on Sep 13, 2023 was negative for disease.  Hospice and end-of-life were briefly discussed, but patient wished to continue with treatment.  Tecentriq  has been discontinued.  Repeat CT scan on December 05, 2023 reviewed independently with decrease in size of lung and liver lesions.  Proceed with cycle 9 of lurbinectedin  today.  Return to clinic in 3 weeks for further evaluation and consideration of cycle 10.  Will reimage prior to next treatment. Clinical stage IIB squamous cell carcinoma, left upper lobe lung:  Patient underwent biopsy with navigational bronchoscopy on October 19, 2018 confirming diagnosis.  Patient declined surgery and wished to pursue XRT along with concurrent chemotherapy.  Given his stage of disease he did not receive maintenance immunotherapy.  He completed cycle 7 of weekly carboplatinum and Taxol  on January 09, 2019 and then completed XRT on January 14, 2019.   History of melanoma: Unclear stage or depth, although by report was in situ.  Patient had Mohs surgery in  fall 2019.  Previous lung biopsy consistent with squamous cell carcinoma. Anxiety/depression: Improved.  Continue current medications as prescribed.  Continue follow-up with primary care physician as well as psychiatry at the [facility]. Shortness of breath: Chronic and unchanged.  Patient's most recent thoracentesis on Sep 08, 2023 removed 1.4  L of fluid.  Continue supplemental oxygen as needed.  Appreciate pulmonary input. Pain: Patient does not complain of this today.  Continue oxycodone  as needed. Peripheral edema: Chronic and unchanged.  Patient has been instructed to increase his torsemide  to daily if his blood pressure remains above 110 systolic. Hyponatremia: Sodium improved to 129 with salt tablets.  Monitor.   Renal insufficiency: Resolved. Hypomagnesia: Mild.  Patient's mag is 1.6 today.  Continue oral magnesium  supplementation.   Anemia: Chronic and unchanged.  Patient's hemoglobin is 11.4..  Dizziness/nausea: Patient does not complain of this today.  Patient expressed understanding and was in agreement with this plan. He also understands that He can call clinic at any time with any questions, concerns, or complaints.    Cancer Staging  Small cell lung cancer, left [facility]) Staging form: Lung, AJCC 8th Edition - Clinical stage from 02/24/2023: Stage IVA (cT4, cN2, cM1a) - Signed by [name] [name], MD on 02/24/2023  Squamous cell carcinoma lung, left (HCC) Staging form: Lung, AJCC 8th Edition - Clinical stage from 10/27/2018: Stage IIB (cT1c, cN1, cM0) - Signed by [name] [name], MD on 11/16/2018   [name] [name], MD   03/04/2024 9:55 AM

## 2024-03-04 NOTE — Patient Instructions (Signed)
 [facility] - A DEPT OF [place]  Discharge Instructions: Thank you for choosing [facility] to provide your oncology and hematology care.  If you have a lab appointment with the Cancer Center, please go directly to the Cancer Center and check in at the registration area.  Wear comfortable clothing and clothing appropriate for easy access to any Portacath or PICC line.   We strive to give you quality time with your provider. You may need to reschedule your appointment if you arrive late (15 or more minutes).  Arriving late affects you and other patients whose appointments are after yours.  Also, if you miss three or more appointments without notifying the office, you may be dismissed from the clinic at the provider's discretion.      For prescription refill requests, have your pharmacy contact our office and allow 72 hours for refills to be completed.    Today you received the following chemotherapy and/or immunotherapy agents Lurbinectedin       To help prevent nausea and vomiting after your treatment, we encourage you to take your nausea medication as directed.  BELOW ARE SYMPTOMS THAT SHOULD BE REPORTED IMMEDIATELY: *FEVER GREATER THAN 100.4 F (38 C) OR HIGHER *CHILLS OR SWEATING *NAUSEA AND VOMITING THAT IS NOT CONTROLLED WITH YOUR NAUSEA MEDICATION *UNUSUAL SHORTNESS OF BREATH *UNUSUAL BRUISING OR BLEEDING *URINARY PROBLEMS (pain or burning when urinating, or frequent urination) *BOWEL PROBLEMS (unusual diarrhea, constipation, pain near the anus) TENDERNESS IN MOUTH AND THROAT WITH OR WITHOUT PRESENCE OF ULCERS (sore throat, sores in mouth, or a toothache) UNUSUAL RASH, SWELLING OR PAIN  UNUSUAL VAGINAL DISCHARGE OR ITCHING   Items with * indicate a potential emergency and should be followed up as soon as possible or go to the Emergency Department if any problems should occur.  Please show the CHEMOTHERAPY ALERT CARD or  IMMUNOTHERAPY ALERT CARD at check-in to the Emergency Department and triage nurse.  Should you have questions after your visit or need to cancel or reschedule your appointment, please contact [facility] - A DEPT OF [name]  [phone] and follow the prompts.  Office hours are 8:00 a.m. to 4:30 p.m. Monday - Friday. Please note that voicemails left after 4:00 p.m. may not be returned until the following business day.  We are closed weekends and major holidays. You have access to a nurse at all times for urgent questions. Please call the main number to the clinic [phone] and follow the prompts.  For any non-urgent questions, you may also contact your provider using MyChart. We now offer e-Visits for anyone 66 and older to request care online for non-urgent symptoms. For details visit [email or web address].   Also download the MyChart app! Go to the app store, search MyChart, open the app, select [place], and log in with your MyChart username and password.

## 2024-03-19 ENCOUNTER — Ambulatory Visit
Admission: RE | Admit: 2024-03-19 | Discharge: 2024-03-19 | Disposition: A | Discharge status: Home / Self Care | Source: Ambulatory Visit | Attending: Oncology | Admitting: Oncology

## 2024-03-19 DIAGNOSIS — C3492 Malignant neoplasm of unspecified part of left bronchus or lung: Secondary | ICD-10-CM | POA: Insufficient documentation

## 2024-03-19 MED ORDER — IOHEXOL 300 MG/ML  SOLN
100.0000 mL | Freq: Once | INTRAMUSCULAR | Status: AC | PRN
  Administered 2024-03-19: 100 mL via INTRAVENOUS

## 2024-03-20 ENCOUNTER — Other Ambulatory Visit: Payer: Self-pay | Admitting: Oncology

## 2024-03-20 ENCOUNTER — Encounter: Payer: Self-pay | Admitting: Oncology

## 2024-03-20 DIAGNOSIS — C3492 Malignant neoplasm of unspecified part of left bronchus or lung: Secondary | ICD-10-CM

## 2024-03-21 ENCOUNTER — Encounter: Payer: Self-pay | Admitting: Oncology

## 2024-03-22 ENCOUNTER — Encounter: Payer: Self-pay | Admitting: Student in an Organized Health Care Education/Training Program

## 2024-03-22 ENCOUNTER — Ambulatory Visit: Admitting: Student in an Organized Health Care Education/Training Program

## 2024-03-22 VITALS — BP 124/78 | HR 83 | Temp 97.8°F | Ht 72.0 in

## 2024-03-22 DIAGNOSIS — J9611 Chronic respiratory failure with hypoxia: Secondary | ICD-10-CM

## 2024-03-22 DIAGNOSIS — C3492 Malignant neoplasm of unspecified part of left bronchus or lung: Secondary | ICD-10-CM | POA: Diagnosis not present

## 2024-03-22 DIAGNOSIS — J90 Pleural effusion, not elsewhere classified: Secondary | ICD-10-CM

## 2024-03-22 DIAGNOSIS — J439 Emphysema, unspecified: Secondary | ICD-10-CM

## 2024-03-22 MED ORDER — ALBUTEROL SULFATE HFA 108 (90 BASE) MCG/ACT IN AERS: 2.0000 | INHALATION_SPRAY | Freq: Four times a day (QID) | RESPIRATORY_TRACT | 11 refills | Status: DC | PRN

## 2024-03-22 MED ORDER — SPIRIVA RESPIMAT 2.5 MCG/ACT IN AERS: 2.0000 | INHALATION_SPRAY | Freq: Every day | RESPIRATORY_TRACT | 12 refills | Status: AC

## 2024-03-22 MED ORDER — FLUTICASONE-SALMETEROL 250-50 MCG/ACT IN AEPB
1.0000 | INHALATION_SPRAY | Freq: Two times a day (BID) | RESPIRATORY_TRACT | 12 refills | Status: AC
Start: 2024-03-22 — End: ?

## 2024-03-22 NOTE — Progress Notes (Signed)
 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 He has been following closely with oncology and has been tolerating cancer directed therapy. Previously with increased pleural effusion resulting in worsening symptoms, but this appears stable. Reviewed recent chest CT with left sided effusion mildly increased in size and suspect his LLL is trapped. Doubt that repeating thoracentesis would help much with symptoms; patient and wife also not excited about the pain a thoracentesis will cause and would prefer not to undergo one at this time.  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 Will also order albuterol  PRN for dyspnea.   - Tiotropium Bromide (SPIRIVA  RESPIMAT) 2.5 MCG/ACT AERS; Inhale 2 puffs into the lungs daily.  Dispense: 4 g; Refill: 12 - fluticasone -salmeterol (WIXELA INHUB) 250-50 MCG/ACT AEPB; Inhale 1 puff into the lungs in the morning and at bedtime.  Dispense: 60 each; Refill: 12 - albuterol  (VENTOLIN  HFA) 108 (90 Base) MCG/ACT inhaler; Inhale 2 puffs into the lungs every 6 (six) hours as needed for wheezing or shortness of breath.  Dispense: 3 each; Refill: 11   Return in about 6 months (around 09/19/2024).  Levi November, MD [facility]  I spent 30 minutes caring for this patient today, including preparing to see the patient, obtaining a medical history , reviewing a separately obtained history, performing a medically appropriate examination and/or evaluation, counseling and educating the patient/family/caregiver, ordering medications, tests, or procedures, documenting clinical information in the electronic health record, and independently  interpreting results (not separately reported/billed) and communicating results to the patient/family/caregiver  End of visit medications:  Meds ordered this encounter  Medications   Tiotropium Bromide (SPIRIVA  RESPIMAT) 2.5 MCG/ACT AERS    Sig: Inhale 2 puffs into the lungs daily.    Dispense:  4 g    Refill:  12   fluticasone -salmeterol (WIXELA INHUB) 250-50 MCG/ACT AEPB    Sig: Inhale 1 puff into the lungs in the morning and at bedtime.    Dispense:  60 each    Refill:  12   albuterol  (VENTOLIN  HFA) 108 (90 Base) MCG/ACT inhaler    Sig: Inhale 2 puffs into the lungs every 6 (six) hours as needed for wheezing or shortness of breath.    Dispense:  3 each    Refill:  11     Current Outpatient Medications:    apixaban  (ELIQUIS ) 2.5 MG TABS tablet, Take 1 tablet (2.5 mg total) by mouth 2 (two) times daily., Disp: , Rfl:    carboxymethylcellulose (REFRESH PLUS) 0.5 % SOLN, 1 drop 4 (four) times daily as needed., Disp: , Rfl:    chlorpheniramine-HYDROcodone  (TUSSIONEX) 10-8 MG/5ML, Take 5 mLs by mouth every 12 (twelve) hours as needed for cough., Disp: 115 mL, Rfl: 0   finasteride (PROSCAR) 5 MG tablet, Take 5 mg by mouth daily., Disp: , Rfl:    fluticasone -salmeterol (WIXELA INHUB) 250-50 MCG/ACT AEPB, Inhale 1 puff into the lungs in the morning and at bedtime., Disp: 60 each, Rfl: 12   glimepiride  (AMARYL ) 2 MG tablet, Take 2 mg by mouth daily with breakfast., Disp: , Rfl:    ipratropium-albuterol  (DUONEB) 0.5-2.5 (3) MG/3ML SOLN, Take 3 mLs by nebulization every 6 (six) hours as needed., Disp:  360 mL, Rfl: 3   levothyroxine  (SYNTHROID ) 200 MCG tablet, Take 200 mcg by mouth daily before breakfast., Disp: , Rfl:    Magnesium  Glycinate 120 MG CAPS, Take 400 mg by mouth., Disp: , Rfl:    Oxycodone  HCl 10 MG TABS, Take 1 tablet (10 mg total) by mouth every 6 (six) hours as needed. Okay to cut tablet in half if needed., Disp: 60 tablet, Rfl: 0   OXYGEN, Inhale 3 L into the lungs continuous.,  Disp: , Rfl:    potassium chloride  SA (KLOR-CON  M) 20 MEQ tablet, TAKE 1 TABLET BY MOUTH TWICE A DAY, Disp: 180 tablet, Rfl: 1   prochlorperazine  (COMPAZINE ) 10 MG tablet, Take 10 mg by mouth every 6 (six) hours as needed., Disp: , Rfl:    SEMAGLUTIDE,0.25 OR 0.5MG /DOS, [place], Inject 0.5 mg into the skin once a week., Disp: , Rfl:    sodium chloride  1 g tablet, Take 1 tablet (1 g total) by mouth 3 (three) times daily., Disp: 90 tablet, Rfl: 1   tamsulosin  (FLOMAX ) 0.4 MG CAPS capsule, 0.8 mg daily., Disp: , Rfl:    torsemide  (DEMADEX ) 20 MG tablet, Take 1 tablet (20 mg total) by mouth daily., Disp: 30 tablet, Rfl: 0   vitamin B-12 (CYANOCOBALAMIN ) 1000 MCG tablet, Take 1,000 mcg by mouth daily., Disp: , Rfl:    albuterol  (PROVENTIL ) (2.5 MG/3ML) 0.083% nebulizer solution, Take 2.5 mg by nebulization every 6 (six) hours as needed for wheezing or shortness of breath. (Patient not taking: Reported on 03/22/2024), Disp: , Rfl:    albuterol  (VENTOLIN  HFA) 108 (90 Base) MCG/ACT inhaler, Inhale 2 puffs into the lungs every 6 (six) hours as needed for wheezing or shortness of breath., Disp: 3 each, Rfl: 11   Tiotropium Bromide (SPIRIVA  RESPIMAT) 2.5 MCG/ACT AERS, Inhale 2 puffs into the lungs daily., Disp: 4 g, Rfl: 12   Subjective:   PATIENT ID: Levi [name] GENDER: male DOB: 1940/11/14, MRN: [id number]  Chief Complaint  Patient presents with   Follow-up    DOE. Occasional wheezing. Cough with grey sputum.  Spiriva - daily  Albuterol - every morning. Duoneb- PRN Advair- BID     HPI  Levi [name] is a pleasant 83 year old male presenting for follow up.  Return Visist 09/06/2023:  Patient presents today in the company of his wife. He is reporting increased shortness of breath at rest and with exertion. He is generally feeling more tired. His oxygen levels have been dropping and he's had to use his oxygen more recently, after having come off of it recently. He is followed closely by Levi [name] from  oncology and has had surveillance imaging recently ordered by Levi [name].   I was following Levi [name] for shortness of breath and respiratory failure. He carries a history of lung cancer treated with chemoradiation in the past. Surveillance imaging was noted for new pleural based mass and lymphadenopathy, biopsied via CT guided biopsy and showed a different primary tumor (small cell carcinoma). He has not required a thoracentesis since we last met. He has been undergoing chemotherapy and has tolerated it well.    We had discussed placement of an IPC during his last visit and deferred at the time unless his effusion continued to re-accumulate with increased frequency. He is maintained on triple therapy with LAMA/LABA/ICS and is currently on 2L of Oxygen via nasal cannula.   Patient underwent restaging CT in October 2024 which returned with findings that are highly concerning for recurrence of his malignancy.  His chest CT is showed a left-sided pleural effusion, pleural nodularity throughout the left hemithorax, a large bulky left hilar mass, enlarged mediastinal and hilar lymph nodes, and the new cavitary nodule in the right pulmonary apex.  Biopsy had shown small cell lung cancer and he was started on carboplatinum, etoposide , and atezolizumab  under the care of Levi [name]. The most recent staging CT from 09/04/2023 showed lesions in the liver in addition to marked interval enlargement of the mediastinal and left hilar lymph nodes in addition to new pleural nodularity in the left lower lobe. There is also increase in the size of the left pleural effusion.   He quit smoking around the time of his diagnosis with his initial lung cancer. At that time, he underwent navigational bronchoscopy with biopsies. He underwent radiation therapy to the LUL mass. He has a history of Afib for which he is followed by cardiology, he is on Eliquis .   Return Visit 03/22/2024:  Continues to be short of breath with  exertion, but this is unchanged compared to prior. Minimal cough. Does feel tired with any exertion. Using inhalers as prescribed. Tolerating chemotherapy as prescribed by oncology.   He did smoke for a long time, with at least 50 to 60 pack years on him. 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 He is currently retired.   PFT's performed 04/22/2022 showed FEV1/FVC at 59% and FEV1 at 69% predicted, post bronchodilator FEV1 was 84% predicted.   Ancillary information including prior medications, full medical/surgical/family/social histories, and PFTs (when available) are listed below and have been reviewed.    Review of Systems  Constitutional:  Negative for chills, fever, malaise/fatigue and weight loss.  Respiratory:  Positive for shortness of breath. Negative for cough, hemoptysis, sputum production and wheezing.   Cardiovascular:  Negative for chest pain.     Objective:   Vitals:   03/22/24 1120  BP: 124/78  Pulse: 83  Temp: 97.8 F (36.6 C)  SpO2: 97%  Height: 6' (1.829 m)   97% on 2 LPM  BMI Readings from Last 3 Encounters:  03/22/24 33.23 kg/m  03/04/24 33.23 kg/m  02/12/24 34.18 kg/m   Wt Readings from Last 3 Encounters:  03/04/24 245 lb (111.1 kg)  02/12/24 252 lb (114.3 kg)  01/22/24 251 lb (113.9 kg)    .vitalsmbmi  Physical Exam Constitutional:      Appearance: Normal appearance. He is obese. He is not ill-appearing.  Cardiovascular:     Rate and Rhythm: Normal rate and regular rhythm.     Pulses: Normal pulses.     Heart sounds: Normal heart sounds.  Pulmonary:     Effort: Pulmonary effort is normal.     Breath sounds: No wheezing or rales.     Comments: Decreased air entry over the left lower lung field Neurological:     General: No focal deficit present.     Mental Status: He is alert and  oriented to person, place, and time. Mental status is at baseline.       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)    Prostate enlargement    Shortness of breath    Squamous cell carcinoma of lung, left (HCC)    Thyroid  disease    Tobacco abuse      Family History  Problem Relation Age of Onset   Aneurysm Mother    Cancer Father      Past Surgical History:  Procedure Laterality Date   CARDIOVERSION N/A    CARDIOVERSION N/A 11/24/2021   Procedure: CARDIOVERSION;  Surgeon: Mady Bruckner, MD;  Location: [facility] OR[facility];  Service: Cardiovascular;  Laterality: N/A;   ELECTROMAGNETIC NAVIGATION BROCHOSCOPY Left 10/19/2018   Procedure: ELECTROMAGNETIC NAVIGATION BRONCHOSCOPY LEFT;  Surgeon: Tamea Dedra CROME, MD;  Location: [facility];  Service: Cardiopulmonary;  Laterality: Left;   ENDOBRONCHIAL ULTRASOUND Left 10/19/2018   Procedure: ENDOBRONCHIAL ULTRASOUND LEFT;  Surgeon: Tamea Dedra CROME, MD;  Location: [facility] OR[facility];  Service: Cardiopulmonary;  Laterality: Left;   IR IMAGING GUIDED PORT INSERTION  03/03/2023   IR THORACENTESIS ASP PLEURAL SPACE W/IMG GUIDE  03/01/2023   MELANOMA EXCISION Left    NO PAST SURGERIES      Social History   Socioeconomic History   Marital status: Married    Spouse name: Nathanel   Number of children: 5   Years of education: Not on file   Highest education level: Not on file  Occupational History   Not on file  Tobacco Use   Smoking status: Former    Current packs/day: 0.00    Average packs/day: 0.3 packs/day for 62.0 years (15.5 ttl pk-yrs)    Types: Cigarettes    Start date: 09/04/1956    Quit date: 09/05/2018    Years since quitting: 5.5   Smokeless tobacco: Never  Vaping Use   Vaping status: Never Used  Substance and Sexual Activity   Alcohol use: No     Alcohol/week: 0.0 standard drinks of alcohol   Drug use: No   Sexual activity: Not on file  Other Topics Concern   Not on file  Social History Narrative   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 He is married to his wife of 30+ years, Nathanel and has 5 children (1 deceased), many grand children, and one great grand child.    Social Drivers of Corporate Investment Banker Strain: Low Risk  (12/04/2018)   Overall Financial Resource Strain (CARDIA)    Difficulty of Paying Living Expenses: Not very hard  Food Insecurity: No Food Insecurity (12/04/2018)   Hunger Vital Sign    Worried About Running Out of Food in the Last Year: Never true    Ran Out of Food in the Last Year: Never true  Transportation Needs: No Transportation Needs (12/04/2018)   PRAPARE - Administrator, Civil Service (Medical): No    Lack of Transportation (Non-Medical): No  Physical Activity: Not on file  Stress: Stress Concern Present (12/04/2018)   [employer] of Occupational Health - Occupational Stress Questionnaire    Feeling of Stress : Very much  Social Connections: Unknown (12/04/2018)   Social Connection and Isolation Panel    Frequency of Communication with Friends and Family: More than three times a week    Frequency of Social Gatherings with Friends and Family: Once a week    Attends Religious Services: Not on [employer] of Clubs or  Organizations: Not on file    Attends Club or Organization Meetings: Not on file    Marital Status: Not on file  Intimate Partner Violence: Not on file     No Known Allergies   CBC    Component Value Date/Time   WBC 4.3 03/04/2024 0851   WBC 3.3 (L) 11/28/2023 1048   RBC 3.61 (L) 03/04/2024 0851   HGB 11.4 (L) 03/04/2024 0851   HCT 34.4 (L) 03/04/2024 0851   PLT 202 03/04/2024 0851    MCV 95.3 03/04/2024 0851   MCV 90.0 10/20/2015 1020   MCH 31.6 03/04/2024 0851   MCHC 33.1 03/04/2024 0851   RDW 16.0 (H) 03/04/2024 0851   LYMPHSABS 0.5 (L) 03/04/2024 0851   MONOABS 0.9 03/04/2024 0851   EOSABS 0.1 03/04/2024 0851   BASOSABS 0.0 03/04/2024 0851    Pulmonary Functions Testing Results:    Latest Ref Rng & Units 04/22/2022   12:02 PM  PFT Results  FVC-Pre L 3.75   FVC-Predicted Pre % 85   FVC-Post L 4.15   FVC-Predicted Post % 93   Pre FEV1/FVC % % 59   Post FEV1/FCV % % 64   FEV1-Pre L 2.20   FEV1-Predicted Pre % 69   FEV1-Post L 2.66   DLCO uncorrected ml/min/mmHg 11.02   DLCO UNC% % 42   DLVA Predicted % 47   TLC L 6.33   TLC % Predicted % 84   RV % Predicted % 78     Outpatient Medications Prior to Visit  Medication Sig Dispense Refill   apixaban  (ELIQUIS ) 2.5 MG TABS tablet Take 1 tablet (2.5 mg total) by mouth 2 (two) times daily.     carboxymethylcellulose (REFRESH PLUS) 0.5 % SOLN 1 drop 4 (four) times daily as needed.     chlorpheniramine-HYDROcodone  (TUSSIONEX) 10-8 MG/5ML Take 5 mLs by mouth every 12 (twelve) hours as needed for cough. 115 mL 0   finasteride (PROSCAR) 5 MG tablet Take 5 mg by mouth daily.     glimepiride  (AMARYL ) 2 MG tablet Take 2 mg by mouth daily with breakfast.     ipratropium-albuterol  (DUONEB) 0.5-2.5 (3) MG/3ML SOLN Take 3 mLs by nebulization every 6 (six) hours as needed. 360 mL 3   levothyroxine  (SYNTHROID ) 200 MCG tablet Take 200 mcg by mouth daily before breakfast.     Magnesium  Glycinate 120 MG CAPS Take 400 mg by mouth.     Oxycodone  HCl 10 MG TABS Take 1 tablet (10 mg total) by mouth every 6 (six) hours as needed. Okay to cut tablet in half if needed. 60 tablet 0   OXYGEN Inhale 3 L into the lungs continuous.     potassium chloride  SA (KLOR-CON  M) 20 MEQ tablet TAKE 1 TABLET BY MOUTH TWICE A DAY 180 tablet 1   prochlorperazine  (COMPAZINE ) 10 MG tablet Take 10 mg by mouth every 6 (six) hours as needed.      SEMAGLUTIDE,0.25 OR 0.5MG /DOS,  Inject 0.5 mg into the skin once a week.     sodium chloride  1 g tablet Take 1 tablet (1 g total) by mouth 3 (three) times daily. 90 tablet 1   tamsulosin  (FLOMAX ) 0.4 MG CAPS capsule 0.8 mg daily.     torsemide  (DEMADEX ) 20 MG tablet Take 1 tablet (20 mg total) by mouth daily. 30 tablet 0   vitamin B-12 (CYANOCOBALAMIN ) 1000 MCG tablet Take 1,000 mcg by mouth daily.     albuterol  (PROVENTIL  HFA;VENTOLIN  HFA) 108 (90 Base) MCG/ACT inhaler Inhale  2 puffs into the lungs every 6 (six) hours as needed for wheezing or shortness of breath. 1 Inhaler 0   fluticasone -salmeterol (ADVAIR) 250-50 MCG/ACT AEPB Inhale 1 puff into the lungs in the morning and at bedtime.     Tiotropium Bromide Monohydrate  (SPIRIVA  RESPIMAT) 2.5 MCG/ACT AERS Inhale 2 puffs into the lungs daily. 1 each 11   albuterol  (PROVENTIL ) (2.5 MG/3ML) 0.083% nebulizer solution Take 2.5 mg by nebulization every 6 (six) hours as needed for wheezing or shortness of breath. (Patient not taking: Reported on 03/22/2024)     No facility-administered medications prior to visit.

## 2024-03-25 ENCOUNTER — Telehealth: Payer: Self-pay

## 2024-03-25 ENCOUNTER — Encounter: Payer: Self-pay | Admitting: Cardiology

## 2024-03-25 ENCOUNTER — Ambulatory Visit: Attending: Cardiology | Admitting: Cardiology

## 2024-03-25 VITALS — BP 126/68 | HR 57 | Ht 72.0 in | Wt 237.6 lb

## 2024-03-25 DIAGNOSIS — I480 Paroxysmal atrial fibrillation: Secondary | ICD-10-CM

## 2024-03-25 DIAGNOSIS — I10 Essential (primary) hypertension: Secondary | ICD-10-CM | POA: Diagnosis not present

## 2024-03-25 DIAGNOSIS — J910 Malignant pleural effusion: Secondary | ICD-10-CM

## 2024-03-25 NOTE — Patient Instructions (Signed)

## 2024-03-25 NOTE — Progress Notes (Signed)
 Cardiology Office Note:    Date:  03/25/2024   ID:  [name], DOB 01-15-1941, MRN [id number]  PCP:  [facility]   [facility] Providers Cardiologist:  Redell Cave, MD Electrophysiologist:  Elspeth Sage, MD (Inactive)     Referring MD: [facility]   Chief Complaint  Patient presents with   Follow-up    6 month follow up pt has been doing well with complaints of chest pain yesterday , chest pressure or SOB,  has some swelling of legs medication reviewed verbally with patient    History of Present Illness:    Levi [name] is a 83 y.o. 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  Doing okay from a cardiac perspective, denies any bleeding issues with Eliquis .  Eliquis  dose reduced due to renal dysfunction.  Has appointment with pulmonary medicine, thoracentesis being planned.  Edema adequately controlled with torsemide .  Denies any cardiac concerns at this time.   Prior notes/testing Lexiscan  Myoview  10/23 no significant ischemia, EF 55 to 60%, low risk study  Past Medical History:  Diagnosis Date   Asthma    COPD (chronic obstructive pulmonary disease) (HCC)    Hearing loss    Hypertension    Hypothyroidism    Mass of left lung    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HCC)    Prostate enlargement    Shortness of breath    Squamous cell carcinoma of lung, left (HCC)    Thyroid  disease    Tobacco abuse     Past Surgical History:  Procedure Laterality Date   CARDIOVERSION N/A    CARDIOVERSION N/A 11/24/2021   Procedure: CARDIOVERSION;  Surgeon: Mady Bruckner, MD;  Location: [facility] ORS;  Service: Cardiovascular;  Laterality: N/A;   ELECTROMAGNETIC NAVIGATION BROCHOSCOPY Left 10/19/2018   Procedure: ELECTROMAGNETIC NAVIGATION BRONCHOSCOPY LEFT;  Surgeon: Tamea Dedra CROME, MD;  Location: [facility] ORS;  Service: Cardiopulmonary;   Laterality: Left;   ENDOBRONCHIAL ULTRASOUND Left 10/19/2018   Procedure: ENDOBRONCHIAL ULTRASOUND LEFT;  Surgeon: Tamea Dedra CROME, MD;  Location: [facility] ORS;  Service: Cardiopulmonary;  Laterality: Left;   IR IMAGING GUIDED PORT INSERTION  03/03/2023   IR THORACENTESIS ASP PLEURAL SPACE W/IMG GUIDE  03/01/2023   MELANOMA EXCISION Left    NO PAST SURGERIES      Current Medications: Current Meds  Medication Sig   albuterol  (VENTOLIN  HFA) 108 (90 Base) MCG/ACT inhaler Inhale 2 puffs into the lungs every 6 (six) hours as needed for wheezing or shortness of breath.   apixaban  (ELIQUIS ) 2.5 MG TABS tablet Take 1 tablet (2.5 mg total) by mouth 2 (two) times daily.   carboxymethylcellulose (REFRESH PLUS) 0.5 % SOLN 1 drop 4 (four) times daily as needed.   chlorpheniramine-HYDROcodone  (TUSSIONEX) 10-8 MG/5ML Take 5 mLs by mouth every 12 (twelve) hours as needed for cough.   finasteride (PROSCAR) 5 MG tablet Take 5 mg by mouth daily.   fluticasone -salmeterol (WIXELA INHUB) 250-50 MCG/ACT AEPB Inhale 1 puff into the lungs in the morning and at bedtime.   ipratropium-albuterol  (DUONEB) 0.5-2.5 (3) MG/3ML SOLN Take 3 mLs by nebulization every 6 (six) hours as needed.   levothyroxine  (SYNTHROID ) 200 MCG tablet Take 200 mcg by mouth daily before breakfast.   Magnesium  Glycinate 120 MG CAPS Take 400 mg by mouth.   Oxycodone  HCl 10 MG TABS Take 1 tablet (10 mg total)  by mouth every 6 (six) hours as needed. Okay to cut tablet in half if needed.   OXYGEN Inhale 3 L into the lungs continuous.   potassium chloride  SA (KLOR-CON  M) 20 MEQ tablet TAKE 1 TABLET BY MOUTH TWICE A DAY   prochlorperazine  (COMPAZINE ) 10 MG tablet Take 10 mg by mouth every 6 (six) hours as needed.   SEMAGLUTIDE,0.25 OR 0.5MG /DOS, [place] Inject 0.5 mg into the skin once a week.   sodium chloride  1 g tablet Take 1 tablet (1 g total) by mouth 3 (three) times daily.   tamsulosin  (FLOMAX ) 0.4 MG CAPS capsule 0.8 mg daily.   Tiotropium Bromide  (SPIRIVA  RESPIMAT) 2.5 MCG/ACT AERS Inhale 2 puffs into the lungs daily.   torsemide  (DEMADEX ) 20 MG tablet Take 1 tablet (20 mg total) by mouth daily.   vitamin B-12 (CYANOCOBALAMIN ) 1000 MCG tablet Take 1,000 mcg by mouth daily.     Allergies:   Patient has no known allergies.   Social History   Socioeconomic History   Marital status: Married    Spouse name: Nathanel   Number of children: 5   Years of education: Not on file   Highest education level: Not on file  Occupational History   Not on file  Tobacco Use   Smoking status: Former    Current packs/day: 0.00    Average packs/day: 0.3 packs/day for 62.0 years (15.5 ttl pk-yrs)    Types: Cigarettes    Start date: 09/04/1956    Quit date: 09/05/2018    Years since quitting: 5.5   Smokeless tobacco: Never  Vaping Use   Vaping status: Never Used  Substance and Sexual Activity   Alcohol use: No    Alcohol/week: 0.0 standard drinks of alcohol   Drug use: No   Sexual activity: Not on file  Other Topics Concern   Not on file  Social History Narrative   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 He is married to his wife of 30+ years, Nathanel and has 5 children (1 deceased), many grand children, and one great grand child.    Social Drivers of Corporate Investment Banker Strain: Low Risk  (12/04/2018)   Overall Financial Resource Strain (CARDIA)    Difficulty of Paying Living Expenses: Not very hard  Food Insecurity: No Food Insecurity (12/04/2018)   Hunger Vital Sign    Worried About Running Out of Food in the Last Year: Never true    Ran Out of Food in the Last Year: Never true  Transportation Needs: No Transportation Needs (12/04/2018)   PRAPARE - Administrator, Civil Service (Medical): No    Lack of Transportation (Non-Medical): No  Physical Activity: Not on  file  Stress: Stress Concern Present (12/04/2018)   [employer] of Occupational Health - Occupational Stress Questionnaire    Feeling of Stress : Very much  Social Connections: Unknown (12/04/2018)   Social Connection and Isolation Panel    Frequency of Communication with Friends and Family: More than three times a week    Frequency of Social Gatherings with Friends and Family: Once a week    Attends Religious Services: Not on Marketing Executive or Organizations: Not on file    Attends Banker Meetings: Not on file    Marital Status: Not  on file     Family History: The patient's family history includes Aneurysm in his mother; Cancer in his father.  ROS:   Please see the history of present illness.     All other systems reviewed and are negative.  EKGs/Labs/Other Studies Reviewed:    The following studies were reviewed today:  EKG Interpretation Date/Time:  Monday March 25 2024 10:16:00 EST Ventricular Rate:  57 PR Interval:    QRS Duration:  100 QT Interval:  424 QTC Calculation: 412 R Axis:   -22  Text Interpretation: Atrial fibrillation with slow ventricular response Confirmed by [name] ([id number]) on 03/25/2024 10:41:57 AM    Recent Labs: 06/26/2023: TSH 0.163 03/04/2024: ALT 11; BUN 16; Creatinine 1.16; Hemoglobin 11.4; Magnesium  1.6; Platelet Count 202; Potassium 3.8; Sodium 129  Recent Lipid Panel No results found for: CHOL, TRIG, HDL, CHOLHDL, VLDL, LDLCALC, LDLDIRECT   Risk Assessment/Calculations:             Physical Exam:    VS:  BP 126/68 (BP Location: Left Arm, Patient Position: Sitting, Cuff Size: Normal)   Pulse (!) 57   Ht 6' (1.829 m)   Wt 237 lb 9.6 oz (107.8 kg)   SpO2 98%   BMI 32.22 kg/m     Wt Readings from Last 3 Encounters:  03/25/24 237 lb 9.6 oz (107.8 kg)  03/04/24 245 lb (111.1 kg)  02/12/24 252 lb (114.3 kg)     GEN:  Well nourished, well developed in no acute  distress HEENT: Normal NECK: No JVD; No carotid bruits CARDIAC: Irregular irregular RESPIRATORY: Diminished breath sounds bilaterally ABDOMEN: Soft, non-tender, non-distended MUSCULOSKELETAL:  No edema; No deformity  SKIN: Warm and dry NEUROLOGIC:  Alert and oriented x 3 PSYCHIATRIC:  Normal affect   ASSESSMENT:    1. Paroxysmal atrial fibrillation (HCC)   2. Primary hypertension    PLAN:    In order of problems listed above:  Permanent atrial fibrillation, heart rate controlled.  History of CKD, last creatinine was normal, monitor creatinine consider increasing Eliquis  dose if creatinine stays normal.  Continue Eliquis  to 2.5 mg twice daily (age, ckd).  Echo 6/25 EF 55 to 60%. Hypertension, BP controlled.  Continue torsemide  20 mg daily.  Follow-up in 6 months     Medication Adjustments/Labs and Tests Ordered: Current medicines are reviewed at length with the patient today.  Concerns regarding medicines are outlined above.  Orders Placed This Encounter  Procedures   EKG 12-Lead   No orders of the defined types were placed in this encounter.   Patient Instructions  Medication Instructions:  Your physician recommends that you continue on your current medications as directed. Please refer to the Current Medication list given to you today.   *If you need a refill on your cardiac medications before your next appointment, please call your pharmacy*  Lab Work: No labs ordered today  If you have labs (blood work) drawn today and your tests are completely normal, you will receive your results only by: MyChart Message (if you have MyChart) OR A paper copy in the mail If you have any lab test that is abnormal or we need to change your treatment, we will call you to review the results.  Testing/Procedures: No test ordered today   Follow-Up: At [facility], you and your health needs are our priority.  As part of our continuing mission to provide you with exceptional  heart care, our providers are all part of one team.  This team  includes your primary Cardiologist (physician) and Advanced Practice Providers or APPs (Physician Assistants and Nurse Practitioners) who all work together to provide you with the care you need, when you need it.  Your next appointment:   6 month(s)  Provider:   You may see Redell Cave, MD or one of the following Advanced Practice Providers on your designated Care Team:   Lonni Meager, NP Lesley Maffucci, PA-C Bernardino Bring, PA-C Cadence Arcade, PA-C Tylene Lunch, NP Barnie Hila, NP    We recommend signing up for the patient portal called MyChart.  Sign up information is provided on this After Visit Summary.  MyChart is used to connect with patients for Virtual Visits (Telemedicine).  Patients are able to view lab/test results, encounter notes, upcoming appointments, etc.  Non-urgent messages can be sent to your provider as well.   To learn more about what you can do with MyChart, go to [email or web address].              Signed, Redell Cave, MD  03/25/2024 11:24 AM    [facility]

## 2024-03-25 NOTE — Telephone Encounter (Signed)
 Patient came into the office today saying he was having increased pain in his chest due to fluid. He was seen on Friday, 11/22. Dr. Isadora offered to refer the patient to IR for a thoracentesis. The patient declined at that time. The patient would now like to have the Thoracentesis done.   Per secure chat with Dr. Isadora- okay to place order for IR Thoracentesis and the patient will have to stop Eliquis  48 hours before.    I have placed the order and notified the patient and his wife.  Nothing further needed.

## 2024-03-26 ENCOUNTER — Ambulatory Visit

## 2024-03-26 ENCOUNTER — Other Ambulatory Visit: Payer: Self-pay

## 2024-03-26 ENCOUNTER — Ambulatory Visit: Admitting: Oncology

## 2024-03-26 ENCOUNTER — Inpatient Hospital Stay

## 2024-03-26 ENCOUNTER — Ambulatory Visit: Admission: RE | Admit: 2024-03-26 | Discharge: 2024-03-26 | Disposition: A | Source: Ambulatory Visit

## 2024-03-26 ENCOUNTER — Ambulatory Visit
Admission: RE | Admit: 2024-03-26 | Discharge: 2024-03-26 | Disposition: A | Discharge status: Home / Self Care | Source: Ambulatory Visit | Attending: Student in an Organized Health Care Education/Training Program | Admitting: Student in an Organized Health Care Education/Training Program

## 2024-03-26 ENCOUNTER — Inpatient Hospital Stay: Admitting: Nurse Practitioner

## 2024-03-26 ENCOUNTER — Encounter: Payer: Self-pay | Admitting: Nurse Practitioner

## 2024-03-26 ENCOUNTER — Ambulatory Visit
Admission: RE | Admit: 2024-03-26 | Discharge: 2024-03-26 | Disposition: A | Discharge status: Home / Self Care | Source: Ambulatory Visit

## 2024-03-26 ENCOUNTER — Other Ambulatory Visit

## 2024-03-26 VITALS — BP 111/62 | HR 61 | Temp 97.4°F | Resp 19 | Ht 72.0 in | Wt 235.2 lb

## 2024-03-26 DIAGNOSIS — Z5111 Encounter for antineoplastic chemotherapy: Secondary | ICD-10-CM | POA: Diagnosis not present

## 2024-03-26 DIAGNOSIS — J910 Malignant pleural effusion: Secondary | ICD-10-CM

## 2024-03-26 DIAGNOSIS — R918 Other nonspecific abnormal finding of lung field: Secondary | ICD-10-CM | POA: Insufficient documentation

## 2024-03-26 DIAGNOSIS — H833X9 Noise effects on inner ear, unspecified ear: Secondary | ICD-10-CM | POA: Insufficient documentation

## 2024-03-26 DIAGNOSIS — C3492 Malignant neoplasm of unspecified part of left bronchus or lung: Secondary | ICD-10-CM

## 2024-03-26 DIAGNOSIS — J90 Pleural effusion, not elsewhere classified: Secondary | ICD-10-CM

## 2024-03-26 DIAGNOSIS — Z7901 Long term (current) use of anticoagulants: Secondary | ICD-10-CM | POA: Insufficient documentation

## 2024-03-26 DIAGNOSIS — Z85118 Personal history of other malignant neoplasm of bronchus and lung: Secondary | ICD-10-CM | POA: Diagnosis not present

## 2024-03-26 DIAGNOSIS — I4821 Permanent atrial fibrillation: Secondary | ICD-10-CM | POA: Insufficient documentation

## 2024-03-26 DIAGNOSIS — C3412 Malignant neoplasm of upper lobe, left bronchus or lung: Secondary | ICD-10-CM | POA: Insufficient documentation

## 2024-03-26 DIAGNOSIS — R0602 Shortness of breath: Secondary | ICD-10-CM | POA: Insufficient documentation

## 2024-03-26 DIAGNOSIS — K219 Gastro-esophageal reflux disease without esophagitis: Secondary | ICD-10-CM | POA: Insufficient documentation

## 2024-03-26 DIAGNOSIS — Z7729 Contact with and (suspected ) exposure to other hazardous substances: Secondary | ICD-10-CM | POA: Insufficient documentation

## 2024-03-26 DIAGNOSIS — H903 Sensorineural hearing loss, bilateral: Secondary | ICD-10-CM | POA: Insufficient documentation

## 2024-03-26 LAB — CBC WITH DIFFERENTIAL (CANCER CENTER ONLY)
Abs Immature Granulocytes: 0.08 K/uL — ABNORMAL HIGH (ref 0.00–0.07)
Basophils Absolute: 0 K/uL (ref 0.0–0.1)
Basophils Relative: 1 %
Eosinophils Absolute: 0 K/uL (ref 0.0–0.5)
Eosinophils Relative: 1 %
HCT: 32.6 % — ABNORMAL LOW (ref 39.0–52.0)
Hemoglobin: 11.2 g/dL — ABNORMAL LOW (ref 13.0–17.0)
Immature Granulocytes: 1 %
Lymphocytes Relative: 10 %
Lymphs Abs: 0.5 K/uL — ABNORMAL LOW (ref 0.7–4.0)
MCH: 31.3 pg (ref 26.0–34.0)
MCHC: 34.4 g/dL (ref 30.0–36.0)
MCV: 91.1 fL (ref 80.0–100.0)
Monocytes Absolute: 1.1 K/uL — ABNORMAL HIGH (ref 0.1–1.0)
Monocytes Relative: 20 %
Neutro Abs: 3.9 K/uL (ref 1.7–7.7)
Neutrophils Relative %: 67 %
Platelet Count: 204 K/uL (ref 150–400)
RBC: 3.58 MIL/uL — ABNORMAL LOW (ref 4.22–5.81)
RDW: 15.9 % — ABNORMAL HIGH (ref 11.5–15.5)
WBC Count: 5.7 K/uL (ref 4.0–10.5)
nRBC: 0 % (ref 0.0–0.2)

## 2024-03-26 LAB — CMP (CANCER CENTER ONLY)
ALT: 10 U/L (ref 0–44)
AST: 20 U/L (ref 15–41)
Albumin: 3.1 g/dL — ABNORMAL LOW (ref 3.5–5.0)
Alkaline Phosphatase: 75 U/L (ref 38–126)
Anion gap: 10 (ref 5–15)
BUN: 17 mg/dL (ref 8–23)
CO2: 21 mmol/L — ABNORMAL LOW (ref 22–32)
Calcium: 8.2 mg/dL — ABNORMAL LOW (ref 8.9–10.3)
Chloride: 95 mmol/L — ABNORMAL LOW (ref 98–111)
Creatinine: 1.33 mg/dL — ABNORMAL HIGH (ref 0.61–1.24)
GFR, Estimated: 54 mL/min — ABNORMAL LOW (ref >60)
Glucose, Bld: 106 mg/dL — ABNORMAL HIGH (ref 70–99)
Potassium: 3.5 mmol/L (ref 3.5–5.1)
Sodium: 126 mmol/L — ABNORMAL LOW (ref 135–145)
Total Bilirubin: 0.8 mg/dL (ref 0.0–1.2)
Total Protein: 6.4 g/dL — ABNORMAL LOW (ref 6.5–8.1)

## 2024-03-26 LAB — MAGNESIUM: Magnesium: 1.6 mg/dL — ABNORMAL LOW (ref 1.7–2.4)

## 2024-03-26 MED ORDER — SODIUM CHLORIDE 0.9% FLUSH
10.0000 mL | Freq: Once | INTRAVENOUS | Status: AC
  Administered 2024-03-26: 10 mL via INTRAVENOUS
  Filled 2024-03-26: qty 10

## 2024-03-26 MED ORDER — LIDOCAINE HCL (PF) 1 % IJ SOLN
10.0000 mL | Freq: Once | INTRAMUSCULAR | Status: AC
  Administered 2024-03-26: 10 mL
  Filled 2024-03-26: qty 10

## 2024-03-26 NOTE — Procedures (Addendum)
 PROCEDURE SUMMARY:  Successful image-guided therapeutic thoracentesis from the left chest.  Yielded 1 liter of serosanguinous fluid.  No immediate complications.  EBL: zero Patient tolerated well.   Specimen not sent for labs.  Post-procedure CXR ordered and reviewed prior to departure from department.   Please see imaging section of Epic for full dictation.  Tauriel Scronce B Shamyia Grandpre NP 03/26/2024 3:10 PM

## 2024-03-26 NOTE — Progress Notes (Signed)
 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  He has chronic weakness and generalized fatigue that is stable over the past several years.  He spends the majority of his day in the chair due to needing to stay on his oxygen but is able to walk short distances.  He has been tolerating treatment well without significant side effects.  Was previously on 2 L of oxygen but now on 3.  Has breathlessness with exertion.  No fevers or chills.  Denies neurologic complaints.  Denies any changes in bowel or bladder habits.  Denies any chest pain, cough, hemoptysis.  His appetite is good and he denies weight loss.  No nausea, vomiting, constipation, diarrhea. Has chronic depression.   REVIEW OF SYSTEMS:  Wife contributes to ROS Review of Systems  Constitutional:  Positive for malaise/fatigue. Negative for chills, fever and weight loss.  HENT:  Positive for hearing loss (chronic- not wearing hearing aids today). Negative for congestion.   Respiratory:  Positive for shortness of breath. Negative for cough, hemoptysis and wheezing.   Cardiovascular: Negative.  Negative for chest pain, palpitations, orthopnea and leg swelling.  Gastrointestinal:  Negative for abdominal pain, constipation, diarrhea and nausea.   Genitourinary:  Negative for dysuria, frequency and urgency.  Musculoskeletal:  Negative for back pain and falls.  Skin:  Negative for rash.  Neurological:  Positive for weakness. Negative for dizziness, tingling, tremors, sensory change, speech change, focal weakness and headaches.  Endo/Heme/Allergies:  Does not bruise/bleed easily.  Psychiatric/Behavioral:  Positive for depression. Negative for memory loss. The patient is not nervous/anxious.   As per HPI. Otherwise, a complete review of systems is negative.  PAST MEDICAL HISTORY: Past Medical History:  Diagnosis Date   Asthma    COPD (chronic obstructive pulmonary disease) (HCC)    Hearing loss    Hypertension    Hypothyroidism    Mass of left lung    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HCC)    Prostate enlargement    Shortness of breath    Squamous cell carcinoma of lung, left (HCC)    Thyroid  disease    Tobacco abuse     PAST SURGICAL HISTORY: Past Surgical History:  Procedure Laterality Date   CARDIOVERSION N/A    CARDIOVERSION N/A 11/24/2021   Procedure: CARDIOVERSION;  Surgeon: Mady Bruckner, MD;  Location: [facility] ORS;  Service: Cardiovascular;  Laterality: N/A;   ELECTROMAGNETIC NAVIGATION BROCHOSCOPY Left 10/19/2018   Procedure: ELECTROMAGNETIC NAVIGATION BRONCHOSCOPY LEFT;  Surgeon: Tamea Dedra CROME, MD;  Location: [facility] ORS;  Service: Cardiopulmonary;  Laterality: Left;   ENDOBRONCHIAL ULTRASOUND Left 10/19/2018   Procedure: ENDOBRONCHIAL ULTRASOUND LEFT;  Surgeon: Tamea Dedra CROME, MD;  Location: [facility] ORS;  Service: Cardiopulmonary;  Laterality: Left;   IR IMAGING GUIDED PORT INSERTION  03/03/2023   IR THORACENTESIS ASP PLEURAL SPACE W/IMG GUIDE  03/01/2023   MELANOMA EXCISION Left    NO PAST SURGERIES  FAMILY HISTORY: Family History  Problem Relation Age of Onset   Aneurysm Mother    Cancer Father    ADVANCED DIRECTIVES (Y/N):  N  HEALTH MAINTENANCE: Social History   Tobacco Use   Smoking status: Former     Current packs/day: 0.00    Average packs/day: 0.3 packs/day for 62.0 years (15.5 ttl pk-yrs)    Types: Cigarettes    Start date: 09/04/1956    Quit date: 09/05/2018    Years since quitting: 5.5   Smokeless tobacco: Never  Vaping Use   Vaping status: Never Used  Substance Use Topics   Alcohol use: No    Alcohol/week: 0.0 standard drinks of alcohol   Drug use: No    Colonoscopy:  PAP:  Bone density:  Lipid panel:  No Known Allergies  Current Outpatient Medications  Medication Sig Dispense Refill   albuterol  (VENTOLIN  HFA) 108 (90 Base) MCG/ACT inhaler Inhale 2 puffs into the lungs every 6 (six) hours as needed for wheezing or shortness of breath. 3 each 11   apixaban  (ELIQUIS ) 2.5 MG TABS tablet Take 1 tablet (2.5 mg total) by mouth 2 (two) times daily.     carboxymethylcellulose (REFRESH PLUS) 0.5 % SOLN 1 drop 4 (four) times daily as needed.     chlorpheniramine-HYDROcodone  (TUSSIONEX) 10-8 MG/5ML Take 5 mLs by mouth every 12 (twelve) hours as needed for cough. 115 mL 0   finasteride (PROSCAR) 5 MG tablet Take 5 mg by mouth daily.     fluticasone -salmeterol (WIXELA INHUB) 250-50 MCG/ACT AEPB Inhale 1 puff into the lungs in the morning and at bedtime. 60 each 12   ipratropium-albuterol  (DUONEB) 0.5-2.5 (3) MG/3ML SOLN Take 3 mLs by nebulization every 6 (six) hours as needed. 360 mL 3   levothyroxine  (SYNTHROID ) 200 MCG tablet Take 200 mcg by mouth daily before breakfast.     Magnesium  Glycinate 120 MG CAPS Take 400 mg by mouth.     Oxycodone  HCl 10 MG TABS Take 1 tablet (10 mg total) by mouth every 6 (six) hours as needed. Okay to cut tablet in half if needed. 60 tablet 0   OXYGEN Inhale 3 L into the lungs continuous.     potassium chloride  SA (KLOR-CON  M) 20 MEQ tablet TAKE 1 TABLET BY MOUTH TWICE A DAY 180 tablet 1   prochlorperazine  (COMPAZINE ) 10 MG tablet Take 10 mg by mouth every 6 (six) hours as needed.     SEMAGLUTIDE,0.25 OR 0.5MG /DOS, [place] Inject 0.5 mg into the skin once a  week.     sodium chloride  1 g tablet Take 1 tablet (1 g total) by mouth 3 (three) times daily. 90 tablet 1   tamsulosin  (FLOMAX ) 0.4 MG CAPS capsule 0.8 mg daily.     Tiotropium Bromide (SPIRIVA  RESPIMAT) 2.5 MCG/ACT AERS Inhale 2 puffs into the lungs daily. 4 g 12   torsemide  (DEMADEX ) 20 MG tablet Take 1 tablet (20 mg total) by mouth daily. 30 tablet 0   vitamin B-12 (CYANOCOBALAMIN ) 1000 MCG tablet Take 1,000 mcg by mouth daily.     albuterol  (PROVENTIL ) (2.5 MG/3ML) 0.083% nebulizer solution Take 2.5 mg by nebulization every 6 (six) hours as needed for wheezing or shortness of breath. (Patient not taking: Reported on 03/25/2024)     glimepiride  (AMARYL ) 2 MG tablet Take 2 mg by mouth daily with breakfast. (Patient not taking: Reported on 03/25/2024)     No current facility-administered medications for this visit.    OBJECTIVE: Vitals:   03/26/24 1024  BP:  111/62  Pulse: 61  Resp: 19  Temp: (!) 97.4 F (36.3 C)  SpO2: 100%   Body mass index is 31.9 kg/m.    ECOG FS:2 - Symptomatic, <50% confined to bed; performs self care.   General: Well-developed, well-nourished, no acute distress. Accompanied by wife.  Eyes: Pink conjunctiva, anicteric sclera. Lungs: diminished bilaterally. On O2 via [place].  Heart: irregular rhythm Abdomen: Soft, nontender, nondistended.  Musculoskeletal: No edema, cyanosis, or clubbing. Neuro: Alert, answering all questions appropriately. Cranial nerves grossly intact. Skin: No rashes or petechiae noted. Psych: Normal affect.   LAB RESULTS: I have independently reviewed lab results as below.  Lab Results  Component Value Date   NA 126 (L) 03/26/2024   K 3.5 03/26/2024   CL 95 (L) 03/26/2024   CO2 21 (L) 03/26/2024   GLUCOSE 106 (H) 03/26/2024   BUN 17 03/26/2024   CREATININE 1.33 (H) 03/26/2024   CALCIUM 8.2 (L) 03/26/2024   PROT 6.4 (L) 03/26/2024   ALBUMIN 3.1 (L) 03/26/2024   AST 20 03/26/2024   ALT 10 03/26/2024   ALKPHOS 75 03/26/2024    BILITOT 0.8 03/26/2024   GFRNONAA 54 (L) 03/26/2024   GFRAA 58 (L) 07/18/2019   Lab Results  Component Value Date   WBC 5.7 03/26/2024   NEUTROABS 3.9 03/26/2024   HGB 11.2 (L) 03/26/2024   HCT 32.6 (L) 03/26/2024   MCV 91.1 03/26/2024   PLT 204 03/26/2024    STUDIES: CT CHEST ABDOMEN PELVIS W CONTRAST Result Date: 03/23/2024 EXAM: CT CHEST ABDOMEN PELVIS WITH THORACIC AND LUMBAR SPINE RECONSTRUCTIONS 03/19/2024 12:15:46 PM TECHNIQUE: CT of the chest, abdomen, pelvis was performed after the administration of intravenous contrast. Multiplanar reformatted images are provided for review, including reconstructed images of the thoracic and lumbar spine. Automated exposure control, iterative reconstruction, and/or weight based adjustment of the mA/kV was utilized to reduce the radiation dose to as low as reasonably achievable. COMPARISON: CT dated 12/05/2023. CLINICAL HISTORY: Assess treatment response, small cell lung cancer. * Tracking Code: BO * FINDINGS: CT CHEST: THORACIC AORTA: No acute traumatic injury of the aorta. MEDIASTINUM: Interval enlargement of mediastinal lymph nodes. Right lower paratracheal lymph node measures 34 mm compared to 22 mm. Subcarinal lymph node measures 21 mm compared to 11 mm. Lymph node posterior to the left hilum along the bronchus measures 23 mm increased from 15 mm. No mediastinal hematoma or pneumomediastinum. No acute traumatic injury to the heart or pericardium. The central airways are clear. LUNGS: Left upper lobe nodular consolidation measures 31 x 20 mm compared to 30 mm x 20 mm for no change. There is a moderate left layering effusion which is unchanged. Within the lingula 25 mm nodule on image 95 is increased from 13 mm. Within the left lower lobe there is interval pleural thickening which is increased. This thickening is covered by the effusion but clearly increased in size. For example nodule on image 60 measuring 21 mm and nodule on image 56 measuring 33 mm.  No acute traumatic injury to the lungs. No pulmonary contusion or laceration. No pneumothorax. CHEST WALL: Right sided port. No acute displaced rib fracture. No chest wall hematoma. CT ABDOMEN AND PELVIS: ABDOMINAL AORTA: No acute traumatic injury of the aorta or iliac arteries. HEPATOBILIARY: Hypodensities again noted within the liver parenchyma. Lesions have increased in size. For example subcapsular lesion in the right hepatic lobe anteriorly measuring 24 mm on image 66 is increased from 18 mm. Subtle 12 mm hypoenhancing lesion on image 64 in  the right hepatic lobe appears new from prior. 2 cystic lesions appear unchanged. No acute traumatic injury. SPLEEN: No acute traumatic injury. PANCREAS: No acute traumatic injury. ADRENAL GLANDS: New nodular enlargement of the left adrenal gland to 20 mm on image 63 and image 65. No acute traumatic injury. KIDNEYS: No acute traumatic injury. No hydronephrosis. GI TRACT: No acute traumatic injury of the bowel. No bowel obstruction. PERITONEUM: No ascites or free air. RETROPERITONEUM: No retroperitoneal hematoma. BLADDER: No acute abnormality. REPRODUCTIVE ORGANS: No acute abnormality. BONES: No acute traumatic fracture of the pelvis. THORACIC AND LUMBAR SPINE: BONES AND ALIGNMENT: No traumatic fracture or traumatic malalignment. DEGENERATIVE CHANGES: No severe spinal canal stenosis or bony neural foraminal narrowing. SOFT TISSUES: No paraspinal mass or hematoma. IMPRESSION: 1. New enlarged bulky mediastinal lymphadenopathy consistent with lung cancer nodal progression, including a new large lymph node in the left hilum. 2. Interval increase in nodular pleural metastasis in the left lower lobe within the moderate left pleural effusion. 3. Interval increase in nodule within the lingula. 4. New and/or enlarged hepatic metastases and new left adrenal metastases. 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  Patient completed cycle 4 of carboplatinum, etoposide , and Tecentriq  on May 19, 2023. PET June 07, 2023 with significant improvement of disease burden and patient continued with maintenance Tecentriq .  CT scan results from Sep 04, 2023 revealed progression of disease in both lungs and liver. MRI of the brain on Sep 13, 2023 was negative for disease.  Tecentriq  has been discontinued. Initiated second line lurbinectedin  09/18/23. CT on 12/05/23 showed improvement in size of lung and liver masses. He is currently s/p cycle 9. CT Chest/Abdomen/pelvis from 03/19/24 was independently reviewed and findings consistent with progressive disease with increase in size of mediastinal LNs- right lower paratracheal LN measures 34 mm (previously 22mm), subcarinal LN measures 21 mm (previously 11 mm), LN posterior to left hilum along bronchus measures 23 mm (previously 15 mm), new large lymph node in the left hilum, increase in nodular pleural mets with increased left pleural effusion, new left adrenal metastases, liver lesions 24 mm (previously 18 mm), new 12 mm lesion in right hepatic lobe. Stop lurbinectedin . Today we discussed next options for progression including additional treatments vs clinical trial vs hospice. Patient expresses wishes to proceed with treatment and would consider clinical trials or treatments at tertiary care centers- question if he would be a candidate for tarlatamab. Understands treatments would be palliative in intent. I will reach out to Dr [name] to discuss results and have patient follow up for treatment planning.  Clinical stage IIB squamous cell carcinoma, left upper lobe lung:  Patient underwent biopsy with navigational bronchoscopy on October 19, 2018 confirming diagnosis.  Patient declined surgery and wished to  pursue XRT along with concurrent chemotherapy.  Given his stage of disease he did not receive maintenance immunotherapy.  He completed cycle 7 of weekly carboplatinum and Taxol  on January 09, 2019 and then completed XRT on January 14, 2019.   History of melanoma: Unclear stage or depth, although by report was in situ.  Patient had Mohs surgery in fall 2019.  Previous lung biopsy consistent with squamous cell carcinoma. Anxiety/depression: Ongoing. Declined medications. Managed by pcp and psychiatry at [facility].  Shortness of breath: Chronic and  unchanged.  Patient's most recent thoracentesis on Sep 08, 2023 removed 1.4 L of fluid.  Continue supplemental oxygen as needed.  Appreciate pulmonary input. He is scheduled for thoracentesis later today.  Pain: Patient does not complain of this today.  Continue oxycodone  as needed. Peripheral edema: Chronic and unchanged.  Patient has been instructed to increase his torsemide  to daily if his blood pressure remains above 110 systolic. Hyponatremia: Sodium improved to 129 with salt tablets.  Monitor.   Renal insufficiency: Resolved. A fib- on eliquis .  Hypomagnesia: Mild.  Patient's mag is 1.6 today.  Continue oral magnesium  supplementation.   Anemia: Chronic and unchanged.  Patient's hemoglobin is 11.4..  Dizziness/nausea: Patient does not complain of this today. Previously occurs with a fib.  Goals of care- Hospice and end-of-life were briefly discussed, but patient wishes to continue with treatment.  Understands treatment is given with palliative intent.   Patient expressed understanding and was in agreement with this plan. He also understands that He can call clinic at any time with any questions, concerns, or complaints.    Cancer Staging  Small cell lung cancer, left [facility]) Staging form: Lung, AJCC 8th Edition - Clinical stage from 02/24/2023: Stage IVA (cT4, cN2, cM1a) - Signed by [name] [name], MD on 02/24/2023  Squamous cell carcinoma lung, left  [facility]) Staging form: Lung, AJCC 8th Edition - Clinical stage from 10/27/2018: Stage IIB (cT1c, cN1, cM0) - Signed by [name] [name], MD on 11/16/2018   Tinnie KANDICE Dawn, NP   03/26/2024

## 2024-04-01 ENCOUNTER — Encounter: Payer: Self-pay | Admitting: Oncology

## 2024-04-01 ENCOUNTER — Inpatient Hospital Stay: Attending: Oncology | Admitting: Oncology

## 2024-04-01 VITALS — BP 118/60 | HR 76 | Temp 96.6°F | Resp 16 | Ht 72.0 in | Wt 239.0 lb

## 2024-04-01 DIAGNOSIS — R42 Dizziness and giddiness: Secondary | ICD-10-CM | POA: Insufficient documentation

## 2024-04-01 DIAGNOSIS — R0602 Shortness of breath: Secondary | ICD-10-CM | POA: Diagnosis not present

## 2024-04-01 DIAGNOSIS — D709 Neutropenia, unspecified: Secondary | ICD-10-CM | POA: Diagnosis not present

## 2024-04-01 DIAGNOSIS — D6959 Other secondary thrombocytopenia: Secondary | ICD-10-CM | POA: Insufficient documentation

## 2024-04-01 DIAGNOSIS — Z809 Family history of malignant neoplasm, unspecified: Secondary | ICD-10-CM | POA: Insufficient documentation

## 2024-04-01 DIAGNOSIS — Z5111 Encounter for antineoplastic chemotherapy: Secondary | ICD-10-CM | POA: Diagnosis present

## 2024-04-01 DIAGNOSIS — D649 Anemia, unspecified: Secondary | ICD-10-CM | POA: Diagnosis not present

## 2024-04-01 DIAGNOSIS — F419 Anxiety disorder, unspecified: Secondary | ICD-10-CM | POA: Diagnosis not present

## 2024-04-01 DIAGNOSIS — Z87891 Personal history of nicotine dependence: Secondary | ICD-10-CM | POA: Diagnosis not present

## 2024-04-01 DIAGNOSIS — Z79634 Long term (current) use of topoisomerase inhibitor: Secondary | ICD-10-CM | POA: Insufficient documentation

## 2024-04-01 DIAGNOSIS — C3492 Malignant neoplasm of unspecified part of left bronchus or lung: Secondary | ICD-10-CM | POA: Diagnosis not present

## 2024-04-01 DIAGNOSIS — C787 Secondary malignant neoplasm of liver and intrahepatic bile duct: Secondary | ICD-10-CM | POA: Insufficient documentation

## 2024-04-01 DIAGNOSIS — E871 Hypo-osmolality and hyponatremia: Secondary | ICD-10-CM | POA: Insufficient documentation

## 2024-04-01 DIAGNOSIS — C782 Secondary malignant neoplasm of pleura: Secondary | ICD-10-CM | POA: Diagnosis not present

## 2024-04-01 DIAGNOSIS — C3412 Malignant neoplasm of upper lobe, left bronchus or lung: Secondary | ICD-10-CM | POA: Insufficient documentation

## 2024-04-01 DIAGNOSIS — R609 Edema, unspecified: Secondary | ICD-10-CM | POA: Diagnosis not present

## 2024-04-01 DIAGNOSIS — R630 Anorexia: Secondary | ICD-10-CM | POA: Insufficient documentation

## 2024-04-01 DIAGNOSIS — R110 Nausea: Secondary | ICD-10-CM | POA: Diagnosis not present

## 2024-04-01 DIAGNOSIS — C7972 Secondary malignant neoplasm of left adrenal gland: Secondary | ICD-10-CM | POA: Diagnosis not present

## 2024-04-01 DIAGNOSIS — Z85820 Personal history of malignant melanoma of skin: Secondary | ICD-10-CM | POA: Diagnosis not present

## 2024-04-01 DIAGNOSIS — F32A Depression, unspecified: Secondary | ICD-10-CM | POA: Diagnosis not present

## 2024-04-01 NOTE — Progress Notes (Signed)
 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  INTERVAL HISTORY: Patient returns to clinic today for further evaluation and discussion of his treatment options.  Recent CT scan of the chest revealed significant progression of disease.  He continues to have chronic weakness and fatigue.  He continues to have increased anxiety.  He denies any recent fevers.  He has no neurologic complaints.  He denies any chest pain, cough, or hemoptysis.  He has chronic shortness of breath and requires supplemental oxygen.  He has a good appetite and denies weight loss.  He denies any nausea, vomiting, constipation, or diarrhea.  He has no urinary complaints.  Patient offers no further specific complaints today.  REVIEW OF SYSTEMS:   Review of Systems  Constitutional:  Positive for malaise/fatigue. Negative for fever and weight loss.  HENT:  Negative for congestion.   Respiratory:  Positive for shortness of breath. Negative for cough and hemoptysis.   Cardiovascular: Negative.  Negative for chest pain and leg swelling.  Gastrointestinal: Negative.  Negative for abdominal pain and nausea.  Genitourinary: Negative.  Negative for dysuria.  Musculoskeletal: Negative.  Negative for back pain.  Skin:  Negative for rash.  Neurological:  Positive for weakness. Negative for dizziness, focal weakness and headaches.  Endo/Heme/Allergies:  Does not bruise/bleed easily.  Psychiatric/Behavioral:  Negative for depression. The patient is nervous/anxious.     As per HPI. Otherwise, a complete  review of systems is negative.  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 Has smoked 55 yrs.      Squamous cell carcinoma of lung, left (HCC)    Thyroid  disease    Tobacco abuse     PAST SURGICAL HISTORY: Past Surgical History:  Procedure Laterality Date   CARDIOVERSION N/A    CARDIOVERSION N/A 11/24/2021   Procedure: CARDIOVERSION;  Surgeon: Mady Bruckner, MD;  Location: [facility] ORS;  Service: Cardiovascular;  Laterality: N/A;   ELECTROMAGNETIC NAVIGATION BROCHOSCOPY Left 10/19/2018   Procedure: ELECTROMAGNETIC NAVIGATION BRONCHOSCOPY LEFT;  Surgeon: Tamea Dedra CROME, MD;  Location: [facility] ORS;  Service: Cardiopulmonary;  Laterality: Left;   ENDOBRONCHIAL ULTRASOUND Left 10/19/2018   Procedure: ENDOBRONCHIAL ULTRASOUND LEFT;  Surgeon: Tamea Dedra CROME, MD;  Location: [facility] ORS;  Service: Cardiopulmonary;  Laterality: Left;   IR IMAGING GUIDED PORT INSERTION  03/03/2023   IR THORACENTESIS ASP PLEURAL SPACE W/IMG GUIDE  03/01/2023   MELANOMA EXCISION Left    NO PAST SURGERIES      FAMILY HISTORY: Family History  Problem Relation Age of Onset   Aneurysm Mother    Cancer Father     ADVANCED DIRECTIVES (Y/N):  N  HEALTH MAINTENANCE: Social History   Tobacco Use   Smoking status: Former    Current  packs/day: 0.00    Average packs/day: 0.3 packs/day for 62.0 years (15.5 ttl pk-yrs)    Types: Cigarettes    Start date: 09/04/1956    Quit date: 09/05/2018    Years since quitting: 5.5   Smokeless tobacco: Never  Vaping Use   Vaping status: Never Used  Substance Use Topics   Alcohol use: No    Alcohol/week: 0.0 standard drinks of alcohol   Drug  use: No     Colonoscopy:  PAP:  Bone density:  Lipid panel:  No Known Allergies  Current Outpatient Medications  Medication Sig Dispense Refill   albuterol  (VENTOLIN  HFA) 108 (90 Base) MCG/ACT inhaler Inhale 2 puffs into the lungs every 6 (six) hours as needed for wheezing or shortness of breath. 3 each 11   apixaban  (ELIQUIS ) 2.5 MG TABS tablet Take 1 tablet (2.5 mg total) by mouth 2 (two) times daily.     carboxymethylcellulose (REFRESH PLUS) 0.5 % SOLN 1 drop 4 (four) times daily as needed.     chlorpheniramine-HYDROcodone  (TUSSIONEX) 10-8 MG/5ML Take 5 mLs by mouth every 12 (twelve) hours as needed for cough. 115 mL 0   finasteride (PROSCAR) 5 MG tablet Take 5 mg by mouth daily.     fluticasone -salmeterol (WIXELA INHUB) 250-50 MCG/ACT AEPB Inhale 1 puff into the lungs in the morning and at bedtime. 60 each 12   ipratropium-albuterol  (DUONEB) 0.5-2.5 (3) MG/3ML SOLN Take 3 mLs by nebulization every 6 (six) hours as needed. 360 mL 3   levothyroxine  (SYNTHROID ) 200 MCG tablet Take 200 mcg by mouth daily before breakfast.     Magnesium  Glycinate 120 MG CAPS Take 400 mg by mouth.     Oxycodone  HCl 10 MG TABS Take 1 tablet (10 mg total) by mouth every 6 (six) hours as needed. Okay to cut tablet in half if needed. 60 tablet 0   OXYGEN Inhale 3 L into the lungs continuous.     potassium chloride  SA (KLOR-CON  M) 20 MEQ tablet TAKE 1 TABLET BY MOUTH TWICE A DAY 180 tablet 1   prochlorperazine  (COMPAZINE ) 10 MG tablet Take 10 mg by mouth every 6 (six) hours as needed.     SEMAGLUTIDE,0.25 OR 0.5MG /DOS, [place] Inject 0.5 mg into the skin once a week.     sodium chloride  1 g tablet Take 1 tablet (1 g total) by mouth 3 (three) times daily. 90 tablet 1   tamsulosin  (FLOMAX ) 0.4 MG CAPS capsule 0.8 mg daily.     Tiotropium Bromide (SPIRIVA  RESPIMAT) 2.5 MCG/ACT AERS Inhale 2 puffs into the lungs daily. 4 g 12   torsemide  (DEMADEX ) 20 MG tablet Take 1 tablet (20 mg total) by mouth daily. 30 tablet 0    vitamin B-12 (CYANOCOBALAMIN ) 1000 MCG tablet Take 1,000 mcg by mouth daily.     albuterol  (PROVENTIL ) (2.5 MG/3ML) 0.083% nebulizer solution Take 2.5 mg by nebulization every 6 (six) hours as needed for wheezing or shortness of breath. (Patient not taking: Reported on 04/01/2024)     glimepiride  (AMARYL ) 2 MG tablet Take 2 mg by mouth daily with breakfast. (Patient not taking: Reported on 04/01/2024)     No current facility-administered medications for this visit.    OBJECTIVE: Vitals:   04/01/24 1026  BP: 118/60  Pulse: 76  Resp: 16  Temp: (!) 96.6 F (35.9 C)  SpO2: 99%      Body mass index is 32.41 kg/m.    ECOG FS:1 - Symptomatic but completely ambulatory  General: Well-developed, well-nourished, no acute  distress.  Sitting in a wheelchair. Eyes: Pink conjunctiva, anicteric sclera. HEENT: Normocephalic, moist mucous membranes. Lungs: No audible wheezing or coughing. Heart: Regular rate and rhythm. Abdomen: Soft, nontender, no obvious distention. Musculoskeletal: No edema, cyanosis, or clubbing. Neuro: Alert, answering all questions appropriately. Cranial nerves grossly intact. Skin: No rashes or petechiae noted. Psych: Normal affect.  LAB RESULTS:  Lab Results  Component Value Date   NA 126 (L) 03/26/2024   K 3.5 03/26/2024   CL 95 (L) 03/26/2024   CO2 21 (L) 03/26/2024   GLUCOSE 106 (H) 03/26/2024   BUN 17 03/26/2024   CREATININE 1.33 (H) 03/26/2024   CALCIUM 8.2 (L) 03/26/2024   PROT 6.4 (L) 03/26/2024   ALBUMIN 3.1 (L) 03/26/2024   AST 20 03/26/2024   ALT 10 03/26/2024   ALKPHOS 75 03/26/2024   BILITOT 0.8 03/26/2024   GFRNONAA 54 (L) 03/26/2024   GFRAA 58 (L) 07/18/2019    Lab Results  Component Value Date   WBC 5.7 03/26/2024   NEUTROABS 3.9 03/26/2024   HGB 11.2 (L) 03/26/2024   HCT 32.6 (L) 03/26/2024   MCV 91.1 03/26/2024   PLT 204 03/26/2024     STUDIES: US  THORACENTESIS ASP PLEURAL SPACE W/IMG GUIDE Result Date: 03/26/2024 INDICATION: 83  year old male with history of stage IV small cell carcinoma of the left lung with recurrent left pleural effusion. Received request for therapeutic thoracentesis. EXAM: ULTRASOUND GUIDED THERAPEUTIC, LEFT-SIDED THORACENTESIS MEDICATIONS: 10 mL 1% lidocaine  COMPLICATIONS: None immediate. PROCEDURE: An ultrasound guided thoracentesis was thoroughly discussed with the patient and questions answered. The benefits, risks, alternatives and complications were also discussed. The patient understands and wishes to proceed with the procedure. Written consent was obtained. Ultrasound was performed to localize and mark an adequate pocket of fluid in the left chest. The area was then prepped and draped in the normal sterile fashion. 1% lidocaine  was used for local anesthesia. Under ultrasound guidance a 6 Fr Safe-T-Centesis catheter was introduced. Thoracentesis was performed. The catheter was removed and a dressing applied. FINDINGS: A total of approximately 1 liter of serosanguinous fluid was removed. IMPRESSION: Successful ultrasound guided left thoracentesis yielding 1 liter of pleural fluid. Performed by: Kristi Davenport, NP under the direct supervision of Dr. Juliene Balder Electronically Signed   By: Juliene Balder M.D.   On: 03/26/2024 15:45   DG Chest Port 1 View Result Date: 03/26/2024 CLINICAL DATA:  Status post left-sided thoracentesis. EXAM: PORTABLE CHEST 1 VIEW COMPARISON:  Radiographs 09/08/2023 and 07/11/2023.  CT 03/19/2024. FINDINGS: 1448 hours. The left pleural effusion appears mildly decreased in volume compared with the most recent CT. There is mildly improved aeration of the left lung base. Persistent medial left upper lobe consolidation. The right lung is clear. No evidence of pneumothorax. Right IJ Port-A-Cath projects to the superior cavoatrial junction. The heart size and mediastinal contours appear unchanged. IMPRESSION: 1. Mildly decreased left pleural effusion following thoracentesis. No evidence of  pneumothorax. 2. Persistent medial left upper lobe consolidation. Electronically Signed   By: Elsie Perone M.D.   On: 03/26/2024 15:26   CT CHEST ABDOMEN PELVIS W CONTRAST Result Date: 03/23/2024 EXAM: CT CHEST ABDOMEN PELVIS WITH THORACIC AND LUMBAR SPINE RECONSTRUCTIONS 03/19/2024 12:15:46 PM TECHNIQUE: CT of the chest, abdomen, pelvis was performed after the administration of intravenous contrast. Multiplanar reformatted images are provided for review, including reconstructed images of the thoracic and lumbar spine. Automated exposure control, iterative reconstruction, and/or weight based adjustment of the mA/kV was utilized to reduce the radiation  dose to as low as reasonably achievable. COMPARISON: CT dated 12/05/2023. CLINICAL HISTORY: Assess treatment response, small cell lung cancer. * Tracking Code: BO * FINDINGS: CT CHEST: THORACIC AORTA: No acute traumatic injury of the aorta. MEDIASTINUM: Interval enlargement of mediastinal lymph nodes. Right lower paratracheal lymph node measures 34 mm compared to 22 mm. Subcarinal lymph node measures 21 mm compared to 11 mm. Lymph node posterior to the left hilum along the bronchus measures 23 mm increased from 15 mm. No mediastinal hematoma or pneumomediastinum. No acute traumatic injury to the heart or pericardium. The central airways are clear. LUNGS: Left upper lobe nodular consolidation measures 31 x 20 mm compared to 30 mm x 20 mm for no change. There is a moderate left layering effusion which is unchanged. Within the lingula 25 mm nodule on image 95 is increased from 13 mm. Within the left lower lobe there is interval pleural thickening which is increased. This thickening is covered by the effusion but clearly increased in size. For example nodule on image 60 measuring 21 mm and nodule on image 56 measuring 33 mm. No acute traumatic injury to the lungs. No pulmonary contusion or laceration. No pneumothorax. CHEST WALL: Right sided port. No acute displaced  rib fracture. No chest wall hematoma. CT ABDOMEN AND PELVIS: ABDOMINAL AORTA: No acute traumatic injury of the aorta or iliac arteries. HEPATOBILIARY: Hypodensities again noted within the liver parenchyma. Lesions have increased in size. For example subcapsular lesion in the right hepatic lobe anteriorly measuring 24 mm on image 66 is increased from 18 mm. Subtle 12 mm hypoenhancing lesion on image 64 in the right hepatic lobe appears new from prior. 2 cystic lesions appear unchanged. No acute traumatic injury. SPLEEN: No acute traumatic injury. PANCREAS: No acute traumatic injury. ADRENAL GLANDS: New nodular enlargement of the left adrenal gland to 20 mm on image 63 and image 65. No acute traumatic injury. KIDNEYS: No acute traumatic injury. No hydronephrosis. GI TRACT: No acute traumatic injury of the bowel. No bowel obstruction. PERITONEUM: No ascites or free air. RETROPERITONEUM: No retroperitoneal hematoma. BLADDER: No acute abnormality. REPRODUCTIVE ORGANS: No acute abnormality. BONES: No acute traumatic fracture of the pelvis. THORACIC AND LUMBAR SPINE: BONES AND ALIGNMENT: No traumatic fracture or traumatic malalignment. DEGENERATIVE CHANGES: No severe spinal canal stenosis or bony neural foraminal narrowing. SOFT TISSUES: No paraspinal mass or hematoma. IMPRESSION: 1. New enlarged bulky mediastinal lymphadenopathy consistent with lung cancer nodal progression, including a new large lymph node in the left hilum. 2. Interval increase in nodular pleural metastasis in the left lower lobe within the moderate left pleural effusion. 3. Interval increase in nodule within the lingula. 4. New and/or enlarged hepatic metastases and new left adrenal metastases. 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  Patient completed cycle 4 of carboplatinum, etoposide , and Tecentriq  on May 19, 2023.  He then received lurbinectedin  between Sep 18, 2023 and March 04, 2024.  CT scan on March 23, 2024 reviewed independently and reported as above with significant progression of disease with bulky mediastinal lymphadenopathy and new enlarged left hilar lymph node and left adrenal metastasis.  CT scan also reported interval increase in pleural as well as hepatic metastasis.  Hospice and end-of-life care were once again discussed, but patient declined.  We discussed treatment options with Tarlatamab, single agent irinotecan , and clinical trial.  Tarlatamab can only be given at the [facility] cancer center, patient did not wish to pursue this option.  He would reconsider if he had no other choice.  He has elected to pursue single agent irinotecan  and will return to clinic on Friday December 2025 to initiate cycle 1.  Plan to give 4 cycles and then reimage.   Clinical stage IIB squamous cell carcinoma, left upper lobe lung:  Patient underwent biopsy with navigational bronchoscopy on October 19, 2018 confirming diagnosis.  Patient declined surgery and wished to pursue XRT along with concurrent chemotherapy.  Given his stage of disease he did not receive maintenance immunotherapy.  He completed cycle 7 of weekly carboplatinum and Taxol  on January 09, 2019 and then completed XRT on January 14, 2019.   History of melanoma: Unclear stage or depth, although by report was in situ.  Patient had Mohs surgery in fall 2019.  Previous lung biopsy consistent with squamous cell carcinoma. Anxiety/depression: Chronic and unchanged.  Continue current medications as prescribed.  Continue follow-up with primary care physician as well as psychiatry at the [facility]. Shortness of breath: Chronic and unchanged.  Patient's most recent thoracentesis on Sep 08, 2023 removed 1.4 L of fluid.  Continue supplemental  oxygen as needed.  Appreciate pulmonary input. Pain: Patient does not complain of this today.  Continue oxycodone  as needed. Peripheral edema: Chronic and unchanged.  Patient has been instructed to increase his torsemide  to daily if his blood pressure remains above 110 systolic. Hyponatremia: Sodium level decreased to 126.  Continue salt tablets as prescribed. Renal insufficiency: Mild.  Patient's most recent creatinine is 1.33. Hypomagnesia: Patient's most recent magnesium  is 1.6.  Continue oral magnesium  supplementation.   Anemia: Chronic and unchanged.  Patient's hemoglobin is 11.2. Dizziness/nausea: Patient does not complain of this today.  Patient expressed understanding and was in agreement with this plan. He also understands that He can call clinic at any time with any questions, concerns, or complaints.    Cancer Staging  Small cell lung cancer, left [facility]) Staging form: Lung, AJCC 8th Edition - Clinical stage from 02/24/2023: Stage IVA (cT4, cN2, cM1a) - Signed by [name] [name], MD on 02/24/2023  Squamous cell carcinoma lung, left (HCC) Staging form: Lung, AJCC 8th Edition - Clinical stage from 10/27/2018: Stage IIB (cT1c, cN1, cM0) - Signed by [name] [name], MD on 11/16/2018   [name] [name], MD   04/01/2024 12:03 PM

## 2024-04-01 NOTE — Progress Notes (Signed)
 DISCONTINUE ON PATHWAY REGIMEN - Small Cell Lung     A cycle is every 21 days:     Lurbinectedin    **Always confirm dose/schedule in your pharmacy ordering system**  PRIOR TREATMENT: [id number]: Lurbinectedin  3.2 mg/m2 IV q21 Days Until Progression or Unacceptable Toxicity  START OFF PATHWAY REGIMEN - Small Cell Lung   [id number]:Irinotecan  180 mg/m2 IV D1 q14 Days:   A cycle is every 14 days:     Irinotecan    **Always confirm dose/schedule in your pharmacy ordering system**  Patient Characteristics: Relapsed or Progressive Disease, Third Line and Beyond Therapeutic Status: Relapsed or Progressive Disease Line of Therapy: Third Line and Beyond Intent of Therapy: Non-Curative / Palliative Intent, Discussed with Patient

## 2024-04-01 NOTE — Progress Notes (Signed)
 Pharmacist Chemotherapy Monitoring - Initial Assessment    Anticipated start date: 04/04/24   The following has been reviewed per standard work regarding the patient's treatment regimen: The patient's diagnosis, treatment plan and drug doses, and organ/hematologic function Lab orders and baseline tests specific to treatment regimen  The treatment plan start date, drug sequencing, and pre-medications Prior authorization status  Patient's documented medication list, including drug-drug interaction screen and prescriptions for anti-emetics and supportive care specific to the treatment regimen The drug concentrations, fluid compatibility, administration routes, and timing of the medications to be used The patient's access for treatment and lifetime cumulative dose history, if applicable  The patient's medication allergies and previous infusion related reactions, if applicable   Changes made to treatment plan:  N/A  Follow up needed:  Pending authorization for treatment     Maudie FORBES Andreas, PharmD, BCPS Clinical Pharmacist   04/01/2024  1:56 PM

## 2024-04-01 NOTE — Progress Notes (Signed)
 Patient is feeling more weak. His chest has been hurting more constant recently, especially this morning it was very hard to breath in and out. He is also still having the pain in his left side that radiates to his back. No appetite.

## 2024-04-03 ENCOUNTER — Encounter: Payer: Self-pay | Admitting: Oncology

## 2024-04-04 ENCOUNTER — Encounter: Payer: Self-pay | Admitting: Oncology

## 2024-04-04 ENCOUNTER — Inpatient Hospital Stay: Admitting: Oncology

## 2024-04-04 ENCOUNTER — Inpatient Hospital Stay

## 2024-04-04 ENCOUNTER — Other Ambulatory Visit: Payer: Self-pay

## 2024-04-04 VITALS — BP 102/70 | HR 60 | Temp 98.4°F | Resp 16 | Ht 72.0 in | Wt 236.0 lb

## 2024-04-04 DIAGNOSIS — C3492 Malignant neoplasm of unspecified part of left bronchus or lung: Secondary | ICD-10-CM

## 2024-04-04 DIAGNOSIS — Z5111 Encounter for antineoplastic chemotherapy: Secondary | ICD-10-CM | POA: Diagnosis not present

## 2024-04-04 LAB — CBC WITH DIFFERENTIAL (CANCER CENTER ONLY)
Abs Immature Granulocytes: 0.05 K/uL (ref 0.00–0.07)
Basophils Absolute: 0 K/uL (ref 0.0–0.1)
Basophils Relative: 1 %
Eosinophils Absolute: 0 K/uL (ref 0.0–0.5)
Eosinophils Relative: 1 %
HCT: 31.8 % — ABNORMAL LOW (ref 39.0–52.0)
Hemoglobin: 10.4 g/dL — ABNORMAL LOW (ref 13.0–17.0)
Immature Granulocytes: 1 %
Lymphocytes Relative: 7 %
Lymphs Abs: 0.5 K/uL — ABNORMAL LOW (ref 0.7–4.0)
MCH: 30.7 pg (ref 26.0–34.0)
MCHC: 32.7 g/dL (ref 30.0–36.0)
MCV: 93.8 fL (ref 80.0–100.0)
Monocytes Absolute: 0.9 K/uL (ref 0.1–1.0)
Monocytes Relative: 13 %
Neutro Abs: 5.5 K/uL (ref 1.7–7.7)
Neutrophils Relative %: 77 %
Platelet Count: 164 K/uL (ref 150–400)
RBC: 3.39 MIL/uL — ABNORMAL LOW (ref 4.22–5.81)
RDW: 15.8 % — ABNORMAL HIGH (ref 11.5–15.5)
WBC Count: 7.1 K/uL (ref 4.0–10.5)
nRBC: 0 % (ref 0.0–0.2)

## 2024-04-04 LAB — MAGNESIUM: Magnesium: 1.8 mg/dL (ref 1.7–2.4)

## 2024-04-04 LAB — CMP (CANCER CENTER ONLY)
ALT: 10 U/L (ref 0–44)
AST: 20 U/L (ref 15–41)
Albumin: 3.2 g/dL — ABNORMAL LOW (ref 3.5–5.0)
Alkaline Phosphatase: 102 U/L (ref 38–126)
Anion gap: 13 (ref 5–15)
BUN: 20 mg/dL (ref 8–23)
CO2: 21 mmol/L — ABNORMAL LOW (ref 22–32)
Calcium: 8.8 mg/dL — ABNORMAL LOW (ref 8.9–10.3)
Chloride: 99 mmol/L (ref 98–111)
Creatinine: 1.38 mg/dL — ABNORMAL HIGH (ref 0.61–1.24)
GFR, Estimated: 51 mL/min — ABNORMAL LOW (ref >60)
Glucose, Bld: 110 mg/dL — ABNORMAL HIGH (ref 70–99)
Potassium: 3.9 mmol/L (ref 3.5–5.1)
Sodium: 133 mmol/L — ABNORMAL LOW (ref 135–145)
Total Bilirubin: 0.8 mg/dL (ref 0.0–1.2)
Total Protein: 6.1 g/dL — ABNORMAL LOW (ref 6.5–8.1)

## 2024-04-04 MED ORDER — ATROPINE SULFATE 1 MG/ML IV SOLN
0.5000 mg | Freq: Once | INTRAVENOUS | Status: AC | PRN
  Administered 2024-04-04: 0.5 mg via INTRAVENOUS
  Filled 2024-04-04: qty 1

## 2024-04-04 MED ORDER — SODIUM CHLORIDE 0.9 % IV SOLN
INTRAVENOUS | Status: DC
  Filled 2024-04-04: qty 250

## 2024-04-04 MED ORDER — DEXAMETHASONE SOD PHOSPHATE PF 10 MG/ML IJ SOLN
10.0000 mg | Freq: Once | INTRAMUSCULAR | Status: AC
  Administered 2024-04-04: 10 mg via INTRAVENOUS

## 2024-04-04 MED ORDER — PALONOSETRON HCL INJECTION 0.25 MG/5ML
0.2500 mg | Freq: Once | INTRAVENOUS | Status: AC
  Administered 2024-04-04: 0.25 mg via INTRAVENOUS
  Filled 2024-04-04: qty 5

## 2024-04-04 MED ORDER — DEXAMETHASONE 4 MG PO TABS: 4.0000 mg | ORAL_TABLET | Freq: Every day | ORAL | 1 refills | Status: AC

## 2024-04-04 MED ORDER — SODIUM CHLORIDE 0.9 % IV SOLN
180.0000 mg/m2 | Freq: Once | INTRAVENOUS | Status: AC
  Administered 2024-04-04: 400 mg via INTRAVENOUS
  Filled 2024-04-04: qty 20

## 2024-04-04 NOTE — Progress Notes (Signed)
  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  INTERVAL HISTORY: Patient returns to clinic today for further evaluation and initiation of cycle 1 of single agent irinotecan .  He continues to have chronic weakness and fatigue.  He continues to have increased anxiety.  He denies any recent fevers.  He has no neurologic complaints.  He denies any chest pain, cough, or hemoptysis.  He has chronic shortness of breath and requires supplemental oxygen.  He has a poor appetite, but denies weight loss. He denies any nausea, vomiting, constipation, or diarrhea.  He has no urinary complaints.  Patient offers no further specific complaints today.  REVIEW OF SYSTEMS:   Review of Systems  Constitutional:  Positive for malaise/fatigue. Negative for fever and weight loss.  HENT:  Negative for congestion.   Respiratory:  Positive for shortness of breath. Negative for cough and hemoptysis.   Cardiovascular: Negative.  Negative for chest pain and leg swelling.  Gastrointestinal: Negative.  Negative for abdominal pain and nausea.  Genitourinary: Negative.  Negative for dysuria.  Musculoskeletal: Negative.  Negative for back pain.  Skin:  Negative for rash.  Neurological:  Positive for weakness. Negative for dizziness, focal weakness and headaches.  Endo/Heme/Allergies:  Does not bruise/bleed easily.  Psychiatric/Behavioral:  Negative for depression. The patient is nervous/anxious.     As per HPI. Otherwise, a complete review of systems is negative.  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 Has smoked 55 yrs.      Squamous cell carcinoma of lung, left (HCC)    Thyroid  disease    Tobacco abuse     PAST SURGICAL HISTORY: Past Surgical History:  Procedure Laterality Date   CARDIOVERSION N/A    CARDIOVERSION N/A 11/24/2021   Procedure: CARDIOVERSION;  Surgeon: Mady Bruckner, MD;  Location: [facility] OR[facility];  Service: Cardiovascular;  Laterality: N/A;   ELECTROMAGNETIC NAVIGATION BROCHOSCOPY Left 10/19/2018   Procedure: ELECTROMAGNETIC NAVIGATION BRONCHOSCOPY LEFT;  Surgeon: Tamea Dedra CROME, MD;  Location: [facility];  Service: Cardiopulmonary;  Laterality: Left;   ENDOBRONCHIAL ULTRASOUND Left 10/19/2018   Procedure: ENDOBRONCHIAL ULTRASOUND LEFT;  Surgeon: Tamea Dedra CROME, MD;  Location: [facility] OR[facility];  Service: Cardiopulmonary;  Laterality: Left;   IR IMAGING GUIDED PORT INSERTION  03/03/2023   IR THORACENTESIS ASP PLEURAL SPACE W/IMG GUIDE  03/01/2023   MELANOMA EXCISION Left    NO PAST SURGERIES      FAMILY HISTORY: Family History  Problem Relation Age of Onset   Aneurysm Mother    Cancer Father     ADVANCED DIRECTIVES (Y/N):  N  HEALTH MAINTENANCE: Social History   Tobacco Use   Smoking status: Former    Current packs/day: 0.00    Average packs/day: 0.3 packs/day for  62.0 years (15.5 ttl pk-yrs)    Types: Cigarettes    Start date: 09/04/1956    Quit date: 09/05/2018    Years since quitting: 5.5   Smokeless tobacco: Never  Vaping Use   Vaping status: Never Used  Substance Use Topics   Alcohol use: No    Alcohol/week: 0.0 standard drinks of alcohol   Drug use: No     Colonoscopy:  PAP:  Bone density:  Lipid  panel:  No Known Allergies  Current Outpatient Medications  Medication Sig Dispense Refill   albuterol  (VENTOLIN  HFA) 108 (90 Base) MCG/ACT inhaler Inhale 2 puffs into the lungs every 6 (six) hours as needed for wheezing or shortness of breath. 3 each 11   apixaban  (ELIQUIS ) 2.5 MG TABS tablet Take 1 tablet (2.5 mg total) by mouth 2 (two) times daily.     carboxymethylcellulose (REFRESH PLUS) 0.5 % SOLN 1 drop 4 (four) times daily as needed.     chlorpheniramine-HYDROcodone  (TUSSIONEX) 10-8 MG/5ML Take 5 mLs by mouth every 12 (twelve) hours as needed for cough. 115 mL 0   finasteride (PROSCAR) 5 MG tablet Take 5 mg by mouth daily.     fluticasone -salmeterol (WIXELA INHUB) 250-50 MCG/ACT AEPB Inhale 1 puff into the lungs in the morning and at bedtime. 60 each 12   ipratropium-albuterol  (DUONEB) 0.5-2.5 (3) MG/3ML SOLN Take 3 mLs by nebulization every 6 (six) hours as needed. 360 mL 3   levothyroxine  (SYNTHROID ) 200 MCG tablet Take 200 mcg by mouth daily before breakfast.     Magnesium  Glycinate 120 MG CAPS Take 400 mg by mouth.     Oxycodone  HCl 10 MG TABS Take 1 tablet (10 mg total) by mouth every 6 (six) hours as needed. Okay to cut tablet in half if needed. 60 tablet 0   OXYGEN Inhale 3 L into the lungs continuous.     potassium chloride  SA (KLOR-CON  M) 20 MEQ tablet TAKE 1 TABLET BY MOUTH TWICE A DAY 180 tablet 1   prochlorperazine  (COMPAZINE ) 10 MG tablet Take 10 mg by mouth every 6 (six) hours as needed.     SEMAGLUTIDE,0.25 OR 0.5MG /DOS, [place] Inject 0.5 mg into the skin once a week.     sodium chloride  1 g tablet Take 1 tablet (1 g total) by mouth 3 (three) times daily. 90 tablet 1   tamsulosin  (FLOMAX ) 0.4 MG CAPS capsule 0.8 mg daily.     Tiotropium Bromide (SPIRIVA  RESPIMAT) 2.5 MCG/ACT AERS Inhale 2 puffs into the lungs daily. 4 g 12   torsemide  (DEMADEX ) 20 MG tablet Take 1 tablet (20 mg total) by mouth daily. 30 tablet 0   vitamin B-12 (CYANOCOBALAMIN ) 1000 MCG tablet Take 1,000 mcg  by mouth daily.     albuterol  (PROVENTIL ) (2.5 MG/3ML) 0.083% nebulizer solution Take 2.5 mg by nebulization every 6 (six) hours as needed for wheezing or shortness of breath. (Patient not taking: Reported on 04/04/2024)     dexamethasone  (DECADRON ) 4 MG tablet Take 1 tablet (4 mg total) by mouth daily. 30 tablet 1   glimepiride  (AMARYL ) 2 MG tablet Take 2 mg by mouth daily with breakfast. (Patient not taking: Reported on 04/04/2024)     No current facility-administered medications for this visit.    OBJECTIVE: Vitals:   04/04/24 0903  BP: 102/70  Pulse: 60  Resp: 16  Temp: 98.4 F (36.9 C)  SpO2: 98%      Body mass index is 32.01 kg/m.    ECOG FS:1 - Symptomatic but  completely ambulatory  General: Well-developed, well-nourished, no acute distress.  Sitting in wheelchair. Eyes: Pink conjunctiva, anicteric sclera. HEENT: Normocephalic, moist mucous membranes. Lungs: No audible wheezing or coughing. Heart: Regular rate and rhythm. Abdomen: Soft, nontender, no obvious distention. Musculoskeletal: No edema, cyanosis, or clubbing. Neuro: Alert, answering all questions appropriately. Cranial nerves grossly intact. Skin: No rashes or petechiae noted. Psych: Normal affect.  LAB RESULTS:  Lab Results  Component Value Date   NA 133 (L) 04/04/2024   K 3.9 04/04/2024   CL 99 04/04/2024   CO2 21 (L) 04/04/2024   GLUCOSE 110 (H) 04/04/2024   BUN 20 04/04/2024   CREATININE 1.38 (H) 04/04/2024   CALCIUM 8.8 (L) 04/04/2024   PROT 6.1 (L) 04/04/2024   ALBUMIN 3.2 (L) 04/04/2024   AST 20 04/04/2024   ALT 10 04/04/2024   ALKPHOS 102 04/04/2024   BILITOT 0.8 04/04/2024   GFRNONAA 51 (L) 04/04/2024   GFRAA 58 (L) 07/18/2019    Lab Results  Component Value Date   WBC 7.1 04/04/2024   NEUTROABS 5.5 04/04/2024   HGB 10.4 (L) 04/04/2024   HCT 31.8 (L) 04/04/2024   MCV 93.8 04/04/2024   PLT 164 04/04/2024     STUDIES: US  THORACENTESIS ASP PLEURAL SPACE W/IMG GUIDE Result Date:  03/26/2024 INDICATION: 83 year old male with history of stage IV small cell carcinoma of the left lung with recurrent left pleural effusion. Received request for therapeutic thoracentesis. EXAM: ULTRASOUND GUIDED THERAPEUTIC, LEFT-SIDED THORACENTESIS MEDICATIONS: 10 mL 1% lidocaine  COMPLICATIONS: None immediate. PROCEDURE: An ultrasound guided thoracentesis was thoroughly discussed with the patient and questions answered. The benefits, risks, alternatives and complications were also discussed. The patient understands and wishes to proceed with the procedure. Written consent was obtained. Ultrasound was performed to localize and mark an adequate pocket of fluid in the left chest. The area was then prepped and draped in the normal sterile fashion. 1% lidocaine  was used for local anesthesia. Under ultrasound guidance a 6 Fr Safe-T-Centesis catheter was introduced. Thoracentesis was performed. The catheter was removed and a dressing applied. FINDINGS: A total of approximately 1 liter of serosanguinous fluid was removed. IMPRESSION: Successful ultrasound guided left thoracentesis yielding 1 liter of pleural fluid. Performed by: Kristi Davenport, NP under the direct supervision of Dr. Juliene Balder Electronically Signed   By: Juliene Balder M.D.   On: 03/26/2024 15:45   DG Chest Port 1 View Result Date: 03/26/2024 CLINICAL DATA:  Status post left-sided thoracentesis. EXAM: PORTABLE CHEST 1 VIEW COMPARISON:  Radiographs 09/08/2023 and 07/11/2023.  CT 03/19/2024. FINDINGS: 1448 hours. The left pleural effusion appears mildly decreased in volume compared with the most recent CT. There is mildly improved aeration of the left lung base. Persistent medial left upper lobe consolidation. The right lung is clear. No evidence of pneumothorax. Right IJ Port-A-Cath projects to the superior cavoatrial junction. The heart size and mediastinal contours appear unchanged. IMPRESSION: 1. Mildly decreased left pleural effusion following  thoracentesis. No evidence of pneumothorax. 2. Persistent medial left upper lobe consolidation. Electronically Signed   By: Elsie Perone M.D.   On: 03/26/2024 15:26   CT CHEST ABDOMEN PELVIS W CONTRAST Result Date: 03/23/2024 EXAM: CT CHEST ABDOMEN PELVIS WITH THORACIC AND LUMBAR SPINE RECONSTRUCTIONS 03/19/2024 12:15:46 PM TECHNIQUE: CT of the chest, abdomen, pelvis was performed after the administration of intravenous contrast. Multiplanar reformatted images are provided for review, including reconstructed images of the thoracic and lumbar spine. Automated exposure control, iterative reconstruction, and/or weight based adjustment of the mA/kV  was utilized to reduce the radiation dose to as low as reasonably achievable. COMPARISON: CT dated 12/05/2023. CLINICAL HISTORY: Assess treatment response, small cell lung cancer. * Tracking Code: BO * FINDINGS: CT CHEST: THORACIC AORTA: No acute traumatic injury of the aorta. MEDIASTINUM: Interval enlargement of mediastinal lymph nodes. Right lower paratracheal lymph node measures 34 mm compared to 22 mm. Subcarinal lymph node measures 21 mm compared to 11 mm. Lymph node posterior to the left hilum along the bronchus measures 23 mm increased from 15 mm. No mediastinal hematoma or pneumomediastinum. No acute traumatic injury to the heart or pericardium. The central airways are clear. LUNGS: Left upper lobe nodular consolidation measures 31 x 20 mm compared to 30 mm x 20 mm for no change. There is a moderate left layering effusion which is unchanged. Within the lingula 25 mm nodule on image 95 is increased from 13 mm. Within the left lower lobe there is interval pleural thickening which is increased. This thickening is covered by the effusion but clearly increased in size. For example nodule on image 60 measuring 21 mm and nodule on image 56 measuring 33 mm. No acute traumatic injury to the lungs. No pulmonary contusion or laceration. No pneumothorax. CHEST WALL: Right  sided port. No acute displaced rib fracture. No chest wall hematoma. CT ABDOMEN AND PELVIS: ABDOMINAL AORTA: No acute traumatic injury of the aorta or iliac arteries. HEPATOBILIARY: Hypodensities again noted within the liver parenchyma. Lesions have increased in size. For example subcapsular lesion in the right hepatic lobe anteriorly measuring 24 mm on image 66 is increased from 18 mm. Subtle 12 mm hypoenhancing lesion on image 64 in the right hepatic lobe appears new from prior. 2 cystic lesions appear unchanged. No acute traumatic injury. SPLEEN: No acute traumatic injury. PANCREAS: No acute traumatic injury. ADRENAL GLANDS: New nodular enlargement of the left adrenal gland to 20 mm on image 63 and image 65. No acute traumatic injury. KIDNEYS: No acute traumatic injury. No hydronephrosis. GI TRACT: No acute traumatic injury of the bowel. No bowel obstruction. PERITONEUM: No ascites or free air. RETROPERITONEUM: No retroperitoneal hematoma. BLADDER: No acute abnormality. REPRODUCTIVE ORGANS: No acute abnormality. BONES: No acute traumatic fracture of the pelvis. THORACIC AND LUMBAR SPINE: BONES AND ALIGNMENT: No traumatic fracture or traumatic malalignment. DEGENERATIVE CHANGES: No severe spinal canal stenosis or bony neural foraminal narrowing. SOFT TISSUES: No paraspinal mass or hematoma. IMPRESSION: 1. New enlarged bulky mediastinal lymphadenopathy consistent with lung cancer nodal progression, including a new large lymph node in the left hilum. 2. Interval increase in nodular pleural metastasis in the left lower lobe within the moderate left pleural effusion. 3. Interval increase in nodule within the lingula. 4. New and/or enlarged hepatic metastases and new left adrenal metastases. Electronically signed by: Norleen Boxer MD 03/23/2024 01:47 PM EST RP Workstation: [id number]   ONCOLOGY HISTORY: Biopsy confirmed diagnosis and second primary.  Patient completed cycle 4 of carboplatinum, etoposide , and Tecentriq   on May 19, 2023.  He was on maintenance Tecentriq  between June 05, 2023 and August 28, 2023.  He then received lurbinectedin  between Sep 18, 2023 and March 04, 2024.  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 CT scan also reported interval increase in pleural  as well as hepatic metastasis.  Hospice and end-of-life care were once again discussed, but patient declined.  We discussed treatment options with Tarlatamab, single agent irinotecan , and clinical trial.  Tarlatamab can only be given at the [facility] cancer center, patient did not wish to pursue this option.  He would reconsider if he had no other choice.  He has elected to pursue single agent irinotecan .  Proceed with cycle 1 of treatment today.  Return to clinic in approximately 1 week for laboratory work and further evaluation.  Patient within return to clinic in 2 weeks for further evaluation and consideration of cycle 2.   Clinical stage IIB squamous cell carcinoma, left upper lobe lung:  Patient underwent biopsy with navigational bronchoscopy on October 19, 2018 confirming diagnosis.  Patient declined surgery and wished to pursue XRT along with concurrent chemotherapy.  Given his stage of disease he did not receive maintenance immunotherapy.  He completed cycle 7 of weekly carboplatinum and Taxol  on January 09, 2019 and then completed XRT on January 14, 2019.   History of melanoma: Unclear stage or depth, although by report was in situ.  Patient had Mohs surgery in fall 2019.  Previous lung biopsy consistent with squamous cell carcinoma. Anxiety/depression: Chronic and unchanged.  Continue current medications as prescribed.  Continue follow-up with primary care  physician as well as psychiatry at the [facility]. Shortness of breath: Chronic and unchanged.  Patient underwent thoracentesis on March 26, 2024 which removed 1 L oF fluid.  Continue supplemental oxygen as needed.  Appreciate pulmonary input. Pain: Patient does not complain of this today.  Continue oxycodone  as needed. Peripheral edema: Chronic and unchanged.  Patient has been instructed to increase his torsemide  to daily if his blood pressure remains above 110 systolic. Hyponatremia: Sodium improved to 133.  Continue salt tablets as prescribed. Renal insufficiency: Chronic and unchanged.  Patient's creatinine is 1.38. Hypomagnesia: Resolved.  Continue oral magnesium  supplementation.   Anemia: Hemoglobin has trended down slightly to 10.4. Dizziness/nausea: Continue antiemetics as prescribed. Poor appetite: Patient was given a prescription for dexamethasone  today.  Patient expressed understanding and was in agreement with this plan. He also understands that He can call clinic at any time with any questions, concerns, or complaints.    Cancer Staging  Small cell lung cancer, left [facility]) Staging form: Lung, AJCC 8th Edition - Clinical stage from 02/24/2023: Stage IVA (cT4, cN2, cM1a) - Signed by [name] [name], MD on 02/24/2023  Squamous cell carcinoma lung, left (HCC) Staging form: Lung, AJCC 8th Edition - Clinical stage from 10/27/2018: Stage IIB (cT1c, cN1, cM0) - Signed by [name] [name], MD on 11/16/2018   [name] [name], MD   04/06/2024 7:21 AM

## 2024-04-04 NOTE — Patient Instructions (Signed)
 [facility] - A DEPT OF [facility]  Discharge Instructions: Thank you for choosing [facility] to provide your oncology and hematology care.  If you have a lab appointment with the Cancer Center, please go directly to the Cancer Center and check in at the registration area.  Wear comfortable clothing and clothing appropriate for easy access to any Portacath or PICC line.   We strive to give you quality time with your provider. You may need to reschedule your appointment if you arrive late (15 or more minutes).  Arriving late affects you and other patients whose appointments are after yours.  Also, if you miss three or more appointments without notifying the office, you may be dismissed from the clinic at the provider's discretion.      For prescription refill requests, have your pharmacy contact our office and allow 72 hours for refills to be completed.    Today you received the following chemotherapy and/or immunotherapy agents Irinotecan.      To help prevent nausea and vomiting after your treatment, we encourage you to take your nausea medication as directed.  BELOW ARE SYMPTOMS THAT SHOULD BE REPORTED IMMEDIATELY: *FEVER GREATER THAN 100.4 F (38 C) OR HIGHER *CHILLS OR SWEATING *NAUSEA AND VOMITING THAT IS NOT CONTROLLED WITH YOUR NAUSEA MEDICATION *UNUSUAL SHORTNESS OF BREATH *UNUSUAL BRUISING OR BLEEDING *URINARY PROBLEMS (pain or burning when urinating, or frequent urination) *BOWEL PROBLEMS (unusual diarrhea, constipation, pain near the anus) TENDERNESS IN MOUTH AND THROAT WITH OR WITHOUT PRESENCE OF ULCERS (sore throat, sores in mouth, or a toothache) UNUSUAL RASH, SWELLING OR PAIN  UNUSUAL VAGINAL DISCHARGE OR ITCHING   Items with * indicate a potential emergency and should be followed up as soon as possible or go to the Emergency Department if any problems should occur.  Please show the CHEMOTHERAPY ALERT CARD or IMMUNOTHERAPY  ALERT CARD at check-in to the Emergency Department and triage nurse.  Should you have questions after your visit or need to cancel or reschedule your appointment, please contact [facility] - A DEPT OF [name]  [phone] and follow the prompts.  Office hours are 8:00 a.m. to 4:30 p.m. Monday - Friday. Please note that voicemails left after 4:00 p.m. may not be returned until the following business day.  We are closed weekends and major holidays. You have access to a nurse at all times for urgent questions. Please call the main number to the clinic [phone] and follow the prompts.  For any non-urgent questions, you may also contact your provider using MyChart. We now offer e-Visits for anyone 28 and older to request care online for non-urgent symptoms. For details visit [email or web address].   Also download the MyChart app! Go to the app store, search MyChart, open the app, select [facility], and log in with your MyChart username and password.

## 2024-04-04 NOTE — Progress Notes (Unsigned)
 Patient has no appetite. Isn't drinking that much either. He is taking his pain medication due to his pain being so bad. He fell yesterday.

## 2024-04-06 ENCOUNTER — Encounter: Payer: Self-pay | Admitting: Oncology

## 2024-04-10 ENCOUNTER — Other Ambulatory Visit: Payer: Self-pay | Admitting: *Deleted

## 2024-04-10 DIAGNOSIS — C3492 Malignant neoplasm of unspecified part of left bronchus or lung: Secondary | ICD-10-CM

## 2024-04-11 ENCOUNTER — Inpatient Hospital Stay

## 2024-04-11 ENCOUNTER — Encounter: Payer: Self-pay | Admitting: Oncology

## 2024-04-11 ENCOUNTER — Inpatient Hospital Stay: Admitting: Oncology

## 2024-04-11 VITALS — BP 98/56 | HR 63 | Temp 98.4°F | Resp 16 | Ht 72.0 in | Wt 225.0 lb

## 2024-04-11 DIAGNOSIS — C3492 Malignant neoplasm of unspecified part of left bronchus or lung: Secondary | ICD-10-CM | POA: Diagnosis not present

## 2024-04-11 DIAGNOSIS — Z5111 Encounter for antineoplastic chemotherapy: Secondary | ICD-10-CM | POA: Diagnosis not present

## 2024-04-11 LAB — CBC WITH DIFFERENTIAL/PLATELET
Abs Immature Granulocytes: 0.06 K/uL (ref 0.00–0.07)
Basophils Absolute: 0 K/uL (ref 0.0–0.1)
Basophils Relative: 1 %
Eosinophils Absolute: 0.1 K/uL (ref 0.0–0.5)
Eosinophils Relative: 7 %
HCT: 30.9 % — ABNORMAL LOW (ref 39.0–52.0)
Hemoglobin: 10 g/dL — ABNORMAL LOW (ref 13.0–17.0)
Immature Granulocytes: 4 %
Lymphocytes Relative: 23 %
Lymphs Abs: 0.4 K/uL — ABNORMAL LOW (ref 0.7–4.0)
MCH: 30.9 pg (ref 26.0–34.0)
MCHC: 32.4 g/dL (ref 30.0–36.0)
MCV: 95.4 fL (ref 80.0–100.0)
Monocytes Absolute: 0.1 K/uL (ref 0.1–1.0)
Monocytes Relative: 4 %
Neutro Abs: 1 K/uL — ABNORMAL LOW (ref 1.7–7.7)
Neutrophils Relative %: 61 %
Platelets: 101 K/uL — ABNORMAL LOW (ref 150–400)
RBC: 3.24 MIL/uL — ABNORMAL LOW (ref 4.22–5.81)
RDW: 14.8 % (ref 11.5–15.5)
WBC: 1.6 K/uL — ABNORMAL LOW (ref 4.0–10.5)
nRBC: 0 % (ref 0.0–0.2)

## 2024-04-11 LAB — CMP (CANCER CENTER ONLY)
ALT: 14 U/L (ref 0–44)
AST: 17 U/L (ref 15–41)
Albumin: 3.2 g/dL — ABNORMAL LOW (ref 3.5–5.0)
Alkaline Phosphatase: 91 U/L (ref 38–126)
Anion gap: 10 (ref 5–15)
BUN: 29 mg/dL — ABNORMAL HIGH (ref 8–23)
CO2: 24 mmol/L (ref 22–32)
Calcium: 9.1 mg/dL (ref 8.9–10.3)
Chloride: 104 mmol/L (ref 98–111)
Creatinine: 1.34 mg/dL — ABNORMAL HIGH (ref 0.61–1.24)
GFR, Estimated: 53 mL/min — ABNORMAL LOW (ref >60)
Glucose, Bld: 115 mg/dL — ABNORMAL HIGH (ref 70–99)
Potassium: 4.8 mmol/L (ref 3.5–5.1)
Sodium: 137 mmol/L (ref 135–145)
Total Bilirubin: 0.6 mg/dL (ref 0.0–1.2)
Total Protein: 6.1 g/dL — ABNORMAL LOW (ref 6.5–8.1)

## 2024-04-11 LAB — MAGNESIUM: Magnesium: 2 mg/dL (ref 1.7–2.4)

## 2024-04-11 NOTE — Progress Notes (Signed)
 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  INTERVAL HISTORY: Patient returns to clinic today for repeat laboratory, further evaluation, and to assess his toleration of single agent irinotecan .  He continues to have chronic weakness and fatigue.  His appetite has improved with dexamethasone .  He otherwise has Tallo tolerated his treatment well.  He continues to have increased anxiety.  He denies any recent fevers.  He has no neurologic complaints.  He denies any chest pain, cough, or hemoptysis.  He has chronic shortness of breath and requires supplemental oxygen.  He denies any nausea, vomiting, constipation, or diarrhea.  He has no urinary complaints.  Patient offers no further specific complaints today.  REVIEW OF SYSTEMS:   Review of Systems  Constitutional:  Positive for malaise/fatigue. Negative for fever and weight loss.  HENT:  Negative for congestion.   Respiratory:  Positive for shortness of breath. Negative for cough and hemoptysis.   Cardiovascular: Negative.  Negative for chest pain and leg swelling.  Gastrointestinal: Negative.  Negative for abdominal pain and nausea.  Genitourinary: Negative.  Negative for dysuria.  Musculoskeletal: Negative.  Negative for back pain.  Skin:  Negative for rash.  Neurological:  Positive for weakness. Negative for dizziness, focal weakness and headaches.  Endo/Heme/Allergies:  Does not bruise/bleed easily.  Psychiatric/Behavioral:  Negative for depression. The patient is nervous/anxious.     As per HPI.  Otherwise, a complete review of systems is negative.  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 Has smoked 55 yrs.      Squamous cell carcinoma of lung, left (HCC)    Thyroid  disease    Tobacco abuse     PAST SURGICAL HISTORY: Past Surgical History:  Procedure Laterality Date   CARDIOVERSION N/A    CARDIOVERSION N/A 11/24/2021   Procedure: CARDIOVERSION;  Surgeon: Mady Bruckner, MD;  Location: [facility] ORS;  Service: Cardiovascular;  Laterality: N/A;   ELECTROMAGNETIC NAVIGATION BROCHOSCOPY Left 10/19/2018   Procedure: ELECTROMAGNETIC NAVIGATION BRONCHOSCOPY LEFT;  Surgeon: Tamea Dedra CROME, MD;  Location: [facility] OR[facility];  Service: Cardiopulmonary;  Laterality: Left;   ENDOBRONCHIAL ULTRASOUND Left 10/19/2018   Procedure: ENDOBRONCHIAL ULTRASOUND LEFT;  Surgeon: Tamea Dedra CROME, MD;  Location: [facility] OR[facility];  Service: Cardiopulmonary;  Laterality: Left;   IR IMAGING GUIDED PORT INSERTION  03/03/2023   IR THORACENTESIS ASP PLEURAL SPACE W/IMG GUIDE  03/01/2023   MELANOMA EXCISION Left    NO PAST SURGERIES      FAMILY HISTORY: Family History  Problem Relation Age of Onset   Aneurysm Mother    Cancer Father     ADVANCED DIRECTIVES (Y/N):  N  HEALTH MAINTENANCE: Social History   Tobacco Use   Smoking status: Former    Current packs/day:  0.00    Average packs/day: 0.3 packs/day for 62.0 years (15.5 ttl pk-yrs)    Types: Cigarettes    Start date: 09/04/1956    Quit date: 09/05/2018    Years since quitting: 5.6   Smokeless tobacco: Never  Vaping Use   Vaping status: Never Used  Substance Use Topics   Alcohol use: No    Alcohol/week: 0.0 standard drinks  of alcohol   Drug use: No     Colonoscopy:  PAP:  Bone density:  Lipid panel:  No Known Allergies  Current Outpatient Medications  Medication Sig Dispense Refill   albuterol  (PROVENTIL ) (2.5 MG/3ML) 0.083% nebulizer solution Take 2.5 mg by nebulization every 6 (six) hours as needed for wheezing or shortness of breath. (Patient not taking: Reported on 04/04/2024)     albuterol  (VENTOLIN  HFA) 108 (90 Base) MCG/ACT inhaler Inhale 2 puffs into the lungs every 6 (six) hours as needed for wheezing or shortness of breath. 3 each 11   apixaban  (ELIQUIS ) 2.5 MG TABS tablet Take 1 tablet (2.5 mg total) by mouth 2 (two) times daily.     carboxymethylcellulose (REFRESH PLUS) 0.5 % SOLN 1 drop 4 (four) times daily as needed.     chlorpheniramine-HYDROcodone  (TUSSIONEX) 10-8 MG/5ML Take 5 mLs by mouth every 12 (twelve) hours as needed for cough. 115 mL 0   dexamethasone  (DECADRON ) 4 MG tablet Take 1 tablet (4 mg total) by mouth daily. 30 tablet 1   finasteride (PROSCAR) 5 MG tablet Take 5 mg by mouth daily.     fluticasone -salmeterol (WIXELA INHUB) 250-50 MCG/ACT AEPB Inhale 1 puff into the lungs in the morning and at bedtime. 60 each 12   glimepiride  (AMARYL ) 2 MG tablet Take 2 mg by mouth daily with breakfast. (Patient not taking: Reported on 04/04/2024)     ipratropium-albuterol  (DUONEB) 0.5-2.5 (3) MG/3ML SOLN Take 3 mLs by nebulization every 6 (six) hours as needed. 360 mL 3   levothyroxine  (SYNTHROID ) 200 MCG tablet Take 200 mcg by mouth daily before breakfast.     Magnesium  Glycinate 120 MG CAPS Take 400 mg by mouth.     Oxycodone  HCl 10 MG TABS Take 1 tablet (10 mg total) by mouth every 6 (six) hours as needed. Okay to cut tablet in half if needed. 60 tablet 0   OXYGEN Inhale 3 L into the lungs continuous.     potassium chloride  SA (KLOR-CON  M) 20 MEQ tablet TAKE 1 TABLET BY MOUTH TWICE A DAY 180 tablet 1   prochlorperazine  (COMPAZINE ) 10 MG tablet Take 10 mg by mouth every 6 (six) hours as  needed.     SEMAGLUTIDE,0.25 OR 0.5MG /DOS, [place] Inject 0.5 mg into the skin once a week.     sodium chloride  1 g tablet Take 1 tablet (1 g total) by mouth 3 (three) times daily. 90 tablet 1   tamsulosin  (FLOMAX ) 0.4 MG CAPS capsule 0.8 mg daily.     Tiotropium Bromide (SPIRIVA  RESPIMAT) 2.5 MCG/ACT AERS Inhale 2 puffs into the lungs daily. 4 g 12   torsemide  (DEMADEX ) 20 MG tablet Take 1 tablet (20 mg total) by mouth daily. 30 tablet 0   vitamin B-12 (CYANOCOBALAMIN ) 1000 MCG tablet Take 1,000 mcg by mouth daily.     No current facility-administered medications for this visit.    OBJECTIVE: Vitals:   04/11/24 0947  BP: (!) 98/56  Pulse: 63  Resp: 16  Temp: 98.4 F (36.9 C)  SpO2: 98%      Body mass index is  30.52 kg/m.    ECOG FS:2 - Symptomatic, <50% confined to bed  General: Well-developed, well-nourished, no acute distress. Eyes: Pink conjunctiva, anicteric sclera. HEENT: Normocephalic, moist mucous membranes. Lungs: No audible wheezing or coughing. Heart: Regular rate and rhythm. Abdomen: Soft, nontender, no obvious distention. Musculoskeletal: No edema, cyanosis, or clubbing. Neuro: Alert, answering all questions appropriately. Cranial nerves grossly intact. Skin: No rashes or petechiae noted. Psych: Normal affect.  LAB RESULTS:  Lab Results  Component Value Date   NA 137 04/11/2024   K 4.8 04/11/2024   CL 104 04/11/2024   CO2 24 04/11/2024   GLUCOSE 115 (H) 04/11/2024   BUN 29 (H) 04/11/2024   CREATININE 1.34 (H) 04/11/2024   CALCIUM 9.1 04/11/2024   PROT 6.1 (L) 04/11/2024   ALBUMIN 3.2 (L) 04/11/2024   AST 17 04/11/2024   ALT 14 04/11/2024   ALKPHOS 91 04/11/2024   BILITOT 0.6 04/11/2024   GFRNONAA 53 (L) 04/11/2024   GFRAA 58 (L) 07/18/2019    Lab Results  Component Value Date   WBC 1.6 (L) 04/11/2024   NEUTROABS 1.0 (L) 04/11/2024   HGB 10.0 (L) 04/11/2024   HCT 30.9 (L) 04/11/2024   MCV 95.4 04/11/2024   PLT 101 (L) 04/11/2024      STUDIES: US  THORACENTESIS ASP PLEURAL SPACE W/IMG GUIDE Result Date: 03/26/2024 INDICATION: 83 year old male with history of stage IV small cell carcinoma of the left lung with recurrent left pleural effusion. Received request for therapeutic thoracentesis. EXAM: ULTRASOUND GUIDED THERAPEUTIC, LEFT-SIDED THORACENTESIS MEDICATIONS: 10 mL 1% lidocaine  COMPLICATIONS: None immediate. PROCEDURE: An ultrasound guided thoracentesis was thoroughly discussed with the patient and questions answered. The benefits, risks, alternatives and complications were also discussed. The patient understands and wishes to proceed with the procedure. Written consent was obtained. Ultrasound was performed to localize and mark an adequate pocket of fluid in the left chest. The area was then prepped and draped in the normal sterile fashion. 1% lidocaine  was used for local anesthesia. Under ultrasound guidance a 6 Fr Safe-T-Centesis catheter was introduced. Thoracentesis was performed. The catheter was removed and a dressing applied. FINDINGS: A total of approximately 1 liter of serosanguinous fluid was removed. IMPRESSION: Successful ultrasound guided left thoracentesis yielding 1 liter of pleural fluid. Performed by: Kristi Davenport, NP under the direct supervision of Dr. Juliene Balder Electronically Signed   By: Juliene Balder M.D.   On: 03/26/2024 15:45   DG Chest Port 1 View Result Date: 03/26/2024 CLINICAL DATA:  Status post left-sided thoracentesis. EXAM: PORTABLE CHEST 1 VIEW COMPARISON:  Radiographs 09/08/2023 and 07/11/2023.  CT 03/19/2024. FINDINGS: 1448 hours. The left pleural effusion appears mildly decreased in volume compared with the most recent CT. There is mildly improved aeration of the left lung base. Persistent medial left upper lobe consolidation. The right lung is clear. No evidence of pneumothorax. Right IJ Port-A-Cath projects to the superior cavoatrial junction. The heart size and mediastinal contours appear  unchanged. IMPRESSION: 1. Mildly decreased left pleural effusion following thoracentesis. No evidence of pneumothorax. 2. Persistent medial left upper lobe consolidation. Electronically Signed   By: Elsie Perone M.D.   On: 03/26/2024 15:26   CT CHEST ABDOMEN PELVIS W CONTRAST Result Date: 03/23/2024 EXAM: CT CHEST ABDOMEN PELVIS WITH THORACIC AND LUMBAR SPINE RECONSTRUCTIONS 03/19/2024 12:15:46 PM TECHNIQUE: CT of the chest, abdomen, pelvis was performed after the administration of intravenous contrast. Multiplanar reformatted images are provided for review, including reconstructed images of the thoracic and lumbar spine. Automated exposure control, iterative  reconstruction, and/or weight based adjustment of the mA/kV was utilized to reduce the radiation dose to as low as reasonably achievable. COMPARISON: CT dated 12/05/2023. CLINICAL HISTORY: Assess treatment response, small cell lung cancer. * Tracking Code: BO * FINDINGS: CT CHEST: THORACIC AORTA: No acute traumatic injury of the aorta. MEDIASTINUM: Interval enlargement of mediastinal lymph nodes. Right lower paratracheal lymph node measures 34 mm compared to 22 mm. Subcarinal lymph node measures 21 mm compared to 11 mm. Lymph node posterior to the left hilum along the bronchus measures 23 mm increased from 15 mm. No mediastinal hematoma or pneumomediastinum. No acute traumatic injury to the heart or pericardium. The central airways are clear. LUNGS: Left upper lobe nodular consolidation measures 31 x 20 mm compared to 30 mm x 20 mm for no change. There is a moderate left layering effusion which is unchanged. Within the lingula 25 mm nodule on image 95 is increased from 13 mm. Within the left lower lobe there is interval pleural thickening which is increased. This thickening is covered by the effusion but clearly increased in size. For example nodule on image 60 measuring 21 mm and nodule on image 56 measuring 33 mm. No acute traumatic injury to the  lungs. No pulmonary contusion or laceration. No pneumothorax. CHEST WALL: Right sided port. No acute displaced rib fracture. No chest wall hematoma. CT ABDOMEN AND PELVIS: ABDOMINAL AORTA: No acute traumatic injury of the aorta or iliac arteries. HEPATOBILIARY: Hypodensities again noted within the liver parenchyma. Lesions have increased in size. For example subcapsular lesion in the right hepatic lobe anteriorly measuring 24 mm on image 66 is increased from 18 mm. Subtle 12 mm hypoenhancing lesion on image 64 in the right hepatic lobe appears new from prior. 2 cystic lesions appear unchanged. No acute traumatic injury. SPLEEN: No acute traumatic injury. PANCREAS: No acute traumatic injury. ADRENAL GLANDS: New nodular enlargement of the left adrenal gland to 20 mm on image 63 and image 65. No acute traumatic injury. KIDNEYS: No acute traumatic injury. No hydronephrosis. GI TRACT: No acute traumatic injury of the bowel. No bowel obstruction. PERITONEUM: No ascites or free air. RETROPERITONEUM: No retroperitoneal hematoma. BLADDER: No acute abnormality. REPRODUCTIVE ORGANS: No acute abnormality. BONES: No acute traumatic fracture of the pelvis. THORACIC AND LUMBAR SPINE: BONES AND ALIGNMENT: No traumatic fracture or traumatic malalignment. DEGENERATIVE CHANGES: No severe spinal canal stenosis or bony neural foraminal narrowing. SOFT TISSUES: No paraspinal mass or hematoma. IMPRESSION: 1. New enlarged bulky mediastinal lymphadenopathy consistent with lung cancer nodal progression, including a new large lymph node in the left hilum. 2. Interval increase in nodular pleural metastasis in the left lower lobe within the moderate left pleural effusion. 3. Interval increase in nodule within the lingula. 4. New and/or enlarged hepatic metastases and new left adrenal metastases. Electronically signed by: Norleen Boxer MD 03/23/2024 01:47 PM EST RP Workstation: [id number]   ONCOLOGY HISTORY: Biopsy confirmed diagnosis and  second primary.  Patient completed cycle 4 of carboplatinum, etoposide , and Tecentriq  on May 19, 2023.  He was on maintenance Tecentriq  between June 05, 2023 and August 28, 2023.  He then received lurbinectedin  between Sep 18, 2023 and March 04, 2024.  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  CT scan also reported interval increase in pleural as well as hepatic metastasis.  Hospice and end-of-life care were once again discussed, but patient declined.  We discussed treatment options with Tarlatamab, single agent irinotecan , and clinical trial.  Tarlatamab can only be given at the [facility] cancer center, patient did not wish to pursue this option.  He would reconsider if he had no other choice.  He has elected to pursue single agent irinotecan .  Patient tolerated cycle 1 of irinotecan  well.  Return to clinic in 1 week for further evaluation and consideration of cycle 2. Clinical stage IIB squamous cell carcinoma, left upper lobe lung:  Patient underwent biopsy with navigational bronchoscopy on October 19, 2018 confirming diagnosis.  Patient declined surgery and wished to pursue XRT along with concurrent chemotherapy.  Given his stage of disease he did not receive maintenance immunotherapy.  He completed cycle 7 of weekly carboplatinum and Taxol  on January 09, 2019 and then completed XRT on January 14, 2019.   History of melanoma: Unclear stage or depth, although by report was in situ.  Patient had Mohs surgery in fall 2019.  Previous lung biopsy consistent with squamous cell carcinoma. Anxiety/depression: Chronic and unchanged.  Continue current medications as prescribed.  Continue follow-up with primary care physician as  well as psychiatry at the [facility]. Shortness of breath: Chronic and unchanged.  Patient underwent thoracentesis on March 26, 2024 which removed 1 L of fluid.  Continue supplemental oxygen as needed.  Appreciate pulmonary input. Pain: Patient does not complain of this today.  Continue oxycodone  as needed. Peripheral edema: Chronic and unchanged.  Patient has been instructed to increase his torsemide  to daily if his blood pressure remains above 110 systolic. Hyponatremia: Resolved. Renal insufficiency: Chronic and unchanged.  Patient creatinine is 1.34. Hypomagnesia: Resolved.  Continue oral magnesium  supplementation.   Neutropenia: Patient's ANC is 1.0 today.  Monitor. Anemia: Hemoglobin continues to slowly trend down and is now 10.0, monitor. Thrombocytopenia: Secondary to chemotherapy.  Monitor. Dizziness/nausea: Continue antiemetics as prescribed.  Patient declined any fluids today.   Poor appetite: Improved with 4 mg dexamethasone  daily.  Patient expressed understanding and was in agreement with this plan. He also understands that He can call clinic at any time with any questions, concerns, or complaints.    Cancer Staging  Small cell lung cancer, left [facility]) Staging form: Lung, AJCC 8th Edition - Clinical stage from 02/24/2023: Stage IVA (cT4, cN2, cM1a) - Signed by [name] [name], MD on 02/24/2023  Squamous cell carcinoma lung, left (HCC) Staging form: Lung, AJCC 8th Edition - Clinical stage from 10/27/2018: Stage IIB (cT1c, cN1, cM0) - Signed by [name] [name], MD on 11/16/2018   [name] [name], MD   04/11/2024 1:02 PM

## 2024-04-11 NOTE — Progress Notes (Signed)
 Patient is still feeling more tired and weak, his appetite was great yesterday, but he is still losing weight. He says that he isn't in any pain.   Starting about 2-3 weeks ago he started having a cough with some blood in his sputum.

## 2024-04-15 ENCOUNTER — Inpatient Hospital Stay: Admitting: Oncology

## 2024-04-15 ENCOUNTER — Inpatient Hospital Stay

## 2024-04-15 ENCOUNTER — Telehealth: Admitting: Hospice and Palliative Medicine

## 2024-04-17 ENCOUNTER — Inpatient Hospital Stay (HOSPITAL_BASED_OUTPATIENT_CLINIC_OR_DEPARTMENT_OTHER): Admitting: Hospice and Palliative Medicine

## 2024-04-17 DIAGNOSIS — C3492 Malignant neoplasm of unspecified part of left bronchus or lung: Secondary | ICD-10-CM

## 2024-04-17 NOTE — Progress Notes (Signed)
 Virtual Visit via Telephone Note  I connected with Levi [name] on 04/17/2024 at  2:15 PM EST by telephone and verified that I am speaking with the correct person using two identifiers.  Location: Patient: Home Provider: Clinic   I discussed the limitations, risks, security and privacy concerns of performing an evaluation and management service by telephone and the availability of in person appointments. I also discussed with the patient that there may be a patient responsible charge related to this service. The patient expressed understanding and agreed to proceed.   History of Present Illness: Levi [name] is a 83 y.o. 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 Patient was referred to palliative care to address goals manage ongoing symptoms.    Observations/Objective: I spoke with patient and wife by phone.   Symptomatically, patient endorses recent constipation.  Says that he feels that he has hard stool in his rectum but is also having loose stool.  His PCP suspected impaction and recommended MiraLAX.  Discussed that in some cases patients require manual disimpaction.  His wife verbalized comfort with this process and/or trial of an enema.  Patient is also concerned about hemorrhoids and we discussed possible referral to GI for management.  His wife plans to reach out to his PCP at the [facility].  Assessment and Plan: Stage IVa extensive stage small cell lung cancer -appears to be doing well.  Now on irinotecan  .    Constipation - discussed bowel regimen. GI referral if needed for hemorrhoids  Follow Up Instructions: Follow-up telephone to 2-3 months   I discussed the assessment and treatment plan with the patient. The patient was provided an opportunity  to ask questions and all were answered. The patient agreed with the plan and demonstrated an understanding of the instructions.   The patient was advised to call back or seek an in-person evaluation if the symptoms worsen or if the condition fails to improve as anticipated.  I provided 10 minutes of non-face-to-face time during this encounter.   FONDA JONELLE MOWER, NP

## 2024-04-19 ENCOUNTER — Inpatient Hospital Stay: Admitting: Oncology

## 2024-04-19 ENCOUNTER — Encounter: Payer: Self-pay | Admitting: Oncology

## 2024-04-19 ENCOUNTER — Inpatient Hospital Stay

## 2024-04-19 VITALS — BP 90/62 | HR 52 | Resp 16 | Ht 72.0 in | Wt 227.0 lb

## 2024-04-19 VITALS — BP 98/57

## 2024-04-19 DIAGNOSIS — C3492 Malignant neoplasm of unspecified part of left bronchus or lung: Secondary | ICD-10-CM

## 2024-04-19 DIAGNOSIS — Z5111 Encounter for antineoplastic chemotherapy: Secondary | ICD-10-CM | POA: Diagnosis not present

## 2024-04-19 DIAGNOSIS — K649 Unspecified hemorrhoids: Secondary | ICD-10-CM

## 2024-04-19 LAB — CMP (CANCER CENTER ONLY)
ALT: 14 U/L (ref 0–44)
AST: 15 U/L (ref 15–41)
Albumin: 3.1 g/dL — ABNORMAL LOW (ref 3.5–5.0)
Alkaline Phosphatase: 77 U/L (ref 38–126)
Anion gap: 11 (ref 5–15)
BUN: 22 mg/dL (ref 8–23)
CO2: 21 mmol/L — ABNORMAL LOW (ref 22–32)
Calcium: 8.5 mg/dL — ABNORMAL LOW (ref 8.9–10.3)
Chloride: 103 mmol/L (ref 98–111)
Creatinine: 1.13 mg/dL (ref 0.61–1.24)
GFR, Estimated: >60 mL/min
Glucose, Bld: 105 mg/dL — ABNORMAL HIGH (ref 70–99)
Potassium: 3.9 mmol/L (ref 3.5–5.1)
Sodium: 135 mmol/L (ref 135–145)
Total Bilirubin: 0.3 mg/dL (ref 0.0–1.2)
Total Protein: 5.5 g/dL — ABNORMAL LOW (ref 6.5–8.1)

## 2024-04-19 LAB — CBC WITH DIFFERENTIAL (CANCER CENTER ONLY)
Abs Immature Granulocytes: 0.08 K/uL — ABNORMAL HIGH (ref 0.00–0.07)
Basophils Absolute: 0 K/uL (ref 0.0–0.1)
Basophils Relative: 0 %
Eosinophils Absolute: 0.1 K/uL (ref 0.0–0.5)
Eosinophils Relative: 3 %
HCT: 28.4 % — ABNORMAL LOW (ref 39.0–52.0)
Hemoglobin: 9.3 g/dL — ABNORMAL LOW (ref 13.0–17.0)
Immature Granulocytes: 3 %
Lymphocytes Relative: 28 %
Lymphs Abs: 0.7 K/uL (ref 0.7–4.0)
MCH: 30.6 pg (ref 26.0–34.0)
MCHC: 32.7 g/dL (ref 30.0–36.0)
MCV: 93.4 fL (ref 80.0–100.0)
Monocytes Absolute: 0.5 K/uL (ref 0.1–1.0)
Monocytes Relative: 19 %
Neutro Abs: 1.1 K/uL — ABNORMAL LOW (ref 1.7–7.7)
Neutrophils Relative %: 47 %
Platelet Count: 159 K/uL (ref 150–400)
RBC: 3.04 MIL/uL — ABNORMAL LOW (ref 4.22–5.81)
RDW: 15.9 % — ABNORMAL HIGH (ref 11.5–15.5)
WBC Count: 2.3 K/uL — ABNORMAL LOW (ref 4.0–10.5)
nRBC: 0 % (ref 0.0–0.2)

## 2024-04-19 LAB — MAGNESIUM: Magnesium: 1.8 mg/dL (ref 1.7–2.4)

## 2024-04-19 MED ORDER — PALONOSETRON HCL INJECTION 0.25 MG/5ML
0.2500 mg | Freq: Once | INTRAVENOUS | Status: AC
  Administered 2024-04-19: 0.25 mg via INTRAVENOUS
  Filled 2024-04-19: qty 5

## 2024-04-19 MED ORDER — DEXAMETHASONE SOD PHOSPHATE PF 10 MG/ML IJ SOLN
10.0000 mg | Freq: Once | INTRAMUSCULAR | Status: AC
  Administered 2024-04-19: 10 mg via INTRAVENOUS

## 2024-04-19 MED ORDER — SODIUM CHLORIDE 0.9 % IV SOLN
INTRAVENOUS | Status: DC
  Filled 2024-04-19: qty 250

## 2024-04-19 MED ORDER — SODIUM CHLORIDE 0.9 % IV SOLN
Freq: Once | INTRAVENOUS | Status: AC
  Filled 2024-04-19: qty 250

## 2024-04-19 MED ORDER — SODIUM CHLORIDE 0.9 % IV SOLN
180.0000 mg/m2 | Freq: Once | INTRAVENOUS | Status: AC
  Administered 2024-04-19: 400 mg via INTRAVENOUS
  Filled 2024-04-19: qty 15

## 2024-04-19 MED ORDER — ATROPINE SULFATE 1 MG/ML IV SOLN
0.5000 mg | Freq: Once | INTRAVENOUS | Status: AC | PRN
  Administered 2024-04-19: 0.5 mg via INTRAVENOUS
  Filled 2024-04-19: qty 1

## 2024-04-19 NOTE — Progress Notes (Signed)
 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  INTERVAL HISTORY: Patient returns to clinic today for further evaluation and consideration of cycle 2 of single agent irinotecan .  He has chronic weakness and fatigue, but otherwise feels well.  His appetite has improved with dexamethasone .  He continues to have increased anxiety.  He denies any recent fevers.  He has no neurologic complaints.  He denies any chest pain, cough, or hemoptysis.  He has chronic shortness of breath and requires supplemental oxygen.  He denies any nausea, vomiting, constipation, or diarrhea.  He has no urinary complaints.  Patient offers no further specific complaints today.  REVIEW OF SYSTEMS:   Review of Systems  Constitutional:  Positive for malaise/fatigue. Negative for fever and weight loss.  HENT:  Negative for congestion.   Respiratory:  Positive for shortness of breath. Negative for cough and hemoptysis.   Cardiovascular: Negative.  Negative for chest pain and leg swelling.  Gastrointestinal: Negative.  Negative for abdominal pain and nausea.  Genitourinary: Negative.  Negative for dysuria.  Musculoskeletal: Negative.  Negative for back pain.  Skin:  Negative for rash.  Neurological:  Positive for weakness. Negative for dizziness, focal weakness and headaches.  Endo/Heme/Allergies:  Does not bruise/bleed easily.  Psychiatric/Behavioral:  Negative for depression. The patient is nervous/anxious.     As per HPI. Otherwise, a complete review of systems is negative.  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 Has smoked 55 yrs.      Squamous cell carcinoma of lung, left (HCC)    Thyroid  disease    Tobacco abuse     PAST SURGICAL HISTORY: Past Surgical History:  Procedure Laterality Date   CARDIOVERSION N/A    CARDIOVERSION N/A 11/24/2021   Procedure: CARDIOVERSION;  Surgeon: Mady Bruckner, MD;  Location: [facility] ORS;  Service: Cardiovascular;  Laterality: N/A;   ELECTROMAGNETIC NAVIGATION BROCHOSCOPY Left 10/19/2018   Procedure: ELECTROMAGNETIC NAVIGATION BRONCHOSCOPY LEFT;  Surgeon: Tamea Dedra CROME, MD;  Location: [facility] ORS;  Service: Cardiopulmonary;  Laterality: Left;   ENDOBRONCHIAL ULTRASOUND Left 10/19/2018   Procedure: ENDOBRONCHIAL ULTRASOUND LEFT;  Surgeon: Tamea Dedra CROME, MD;  Location: [facility] ORS;  Service: Cardiopulmonary;  Laterality: Left;   IR IMAGING GUIDED PORT INSERTION  03/03/2023   IR THORACENTESIS ASP PLEURAL SPACE W/IMG GUIDE  03/01/2023   MELANOMA EXCISION Left    NO PAST SURGERIES      FAMILY HISTORY: Family History  Problem Relation Age of Onset   Aneurysm Mother    Cancer Father     ADVANCED DIRECTIVES (Y/N):  N  HEALTH MAINTENANCE: Social History   Tobacco Use   Smoking status: Former    Current packs/day: 0.00    Average packs/day: 0.3 packs/day  for 62.0 years (15.5 ttl pk-yrs)    Types: Cigarettes    Start date: 09/04/1956    Quit date: 09/05/2018    Years since quitting: 5.6   Smokeless tobacco: Never  Vaping Use   Vaping status: Never Used  Substance Use Topics   Alcohol use: No    Alcohol/week: 0.0 standard drinks of alcohol   Drug use: No     Colonoscopy:  PAP:  Bone  density:  Lipid panel:  No Known Allergies  Current Outpatient Medications  Medication Sig Dispense Refill   albuterol  (PROVENTIL ) (2.5 MG/3ML) 0.083% nebulizer solution Take 2.5 mg by nebulization every 6 (six) hours as needed for wheezing or shortness of breath. (Patient not taking: Reported on 04/04/2024)     albuterol  (VENTOLIN  HFA) 108 (90 Base) MCG/ACT inhaler Inhale 2 puffs into the lungs every 6 (six) hours as needed for wheezing or shortness of breath. 3 each 11   apixaban  (ELIQUIS ) 2.5 MG TABS tablet Take 1 tablet (2.5 mg total) by mouth 2 (two) times daily.     carboxymethylcellulose (REFRESH PLUS) 0.5 % SOLN 1 drop 4 (four) times daily as needed.     chlorpheniramine-HYDROcodone  (TUSSIONEX) 10-8 MG/5ML Take 5 mLs by mouth every 12 (twelve) hours as needed for cough. 115 mL 0   dexamethasone  (DECADRON ) 4 MG tablet Take 1 tablet (4 mg total) by mouth daily. 30 tablet 1   finasteride (PROSCAR) 5 MG tablet Take 5 mg by mouth daily.     fluticasone -salmeterol (WIXELA INHUB) 250-50 MCG/ACT AEPB Inhale 1 puff into the lungs in the morning and at bedtime. 60 each 12   glimepiride  (AMARYL ) 2 MG tablet Take 2 mg by mouth daily with breakfast. (Patient not taking: Reported on 04/04/2024)     ipratropium-albuterol  (DUONEB) 0.5-2.5 (3) MG/3ML SOLN Take 3 mLs by nebulization every 6 (six) hours as needed. 360 mL 3   levothyroxine  (SYNTHROID ) 200 MCG tablet Take 200 mcg by mouth daily before breakfast.     Magnesium  Glycinate 120 MG CAPS Take 400 mg by mouth.     Oxycodone  HCl 10 MG TABS Take 1 tablet (10 mg total) by mouth every 6 (six) hours as needed. Okay to cut tablet in half if needed. 60 tablet 0   OXYGEN Inhale 3 L into the lungs continuous.     potassium chloride  SA (KLOR-CON  M) 20 MEQ tablet TAKE 1 TABLET BY MOUTH TWICE A DAY 180 tablet 1   prochlorperazine  (COMPAZINE ) 10 MG tablet Take 10 mg by mouth every 6 (six) hours as needed.     SEMAGLUTIDE,0.25 OR 0.5MG /DOS, [place] Inject 0.5 mg into  the skin once a week.     sodium chloride  1 g tablet Take 1 tablet (1 g total) by mouth 3 (three) times daily. 90 tablet 1   tamsulosin  (FLOMAX ) 0.4 MG CAPS capsule 0.8 mg daily.     Tiotropium Bromide (SPIRIVA  RESPIMAT) 2.5 MCG/ACT AERS Inhale 2 puffs into the lungs daily. 4 g 12   torsemide  (DEMADEX ) 20 MG tablet Take 1 tablet (20 mg total) by mouth daily. 30 tablet 0   vitamin B-12 (CYANOCOBALAMIN ) 1000 MCG tablet Take 1,000 mcg by mouth daily.     No current facility-administered medications for this visit.   Facility-Administered Medications Ordered in Other Visits  Medication Dose Route Frequency Provider Last Rate Last Admin   0.9 %  sodium chloride  infusion   Intravenous Continuous Trindon Dorton J, MD   Stopped at 04/19/24 1311    OBJECTIVE: Vitals:  04/19/24 0905 04/19/24 0907  BP: (!) 109/48 90/62  Pulse: (!) 52   Resp: 16   SpO2: 99%        Body mass index is 30.79 kg/m.    ECOG FS:2 - Symptomatic, <50% confined to bed  General: Well-developed, well-nourished, no acute distress. Eyes: Pink conjunctiva, anicteric sclera. HEENT: Normocephalic, moist mucous membranes. Lungs: No audible wheezing or coughing. Heart: Regular rate and rhythm. Abdomen: Soft, nontender, no obvious distention. Musculoskeletal: No edema, cyanosis, or clubbing. Neuro: Alert, answering all questions appropriately. Cranial nerves grossly intact. Skin: No rashes or petechiae noted. Psych: Normal affect.  LAB RESULTS:  Lab Results  Component Value Date   NA 135 04/19/2024   K 3.9 04/19/2024   CL 103 04/19/2024   CO2 21 (L) 04/19/2024   GLUCOSE 105 (H) 04/19/2024   BUN 22 04/19/2024   CREATININE 1.13 04/19/2024   CALCIUM 8.5 (L) 04/19/2024   PROT 5.5 (L) 04/19/2024   ALBUMIN 3.1 (L) 04/19/2024   AST 15 04/19/2024   ALT 14 04/19/2024   ALKPHOS 77 04/19/2024   BILITOT 0.3 04/19/2024   GFRNONAA >60 04/19/2024   GFRAA 58 (L) 07/18/2019    Lab Results  Component Value Date   WBC  2.3 (L) 04/19/2024   NEUTROABS 1.1 (L) 04/19/2024   HGB 9.3 (L) 04/19/2024   HCT 28.4 (L) 04/19/2024   MCV 93.4 04/19/2024   PLT 159 04/19/2024     STUDIES: US  THORACENTESIS ASP PLEURAL SPACE W/IMG GUIDE Result Date: 03/26/2024 INDICATION: 83 year old male with history of stage IV small cell carcinoma of the left lung with recurrent left pleural effusion. Received request for therapeutic thoracentesis. EXAM: ULTRASOUND GUIDED THERAPEUTIC, LEFT-SIDED THORACENTESIS MEDICATIONS: 10 mL 1% lidocaine  COMPLICATIONS: None immediate. PROCEDURE: An ultrasound guided thoracentesis was thoroughly discussed with the patient and questions answered. The benefits, risks, alternatives and complications were also discussed. The patient understands and wishes to proceed with the procedure. Written consent was obtained. Ultrasound was performed to localize and mark an adequate pocket of fluid in the left chest. The area was then prepped and draped in the normal sterile fashion. 1% lidocaine  was used for local anesthesia. Under ultrasound guidance a 6 Fr Safe-T-Centesis catheter was introduced. Thoracentesis was performed. The catheter was removed and a dressing applied. FINDINGS: A total of approximately 1 liter of serosanguinous fluid was removed. IMPRESSION: Successful ultrasound guided left thoracentesis yielding 1 liter of pleural fluid. Performed by: Kristi Davenport, NP under the direct supervision of Dr. Juliene Balder Electronically Signed   By: Juliene Balder M.D.   On: 03/26/2024 15:45   DG Chest Port 1 View Result Date: 03/26/2024 CLINICAL DATA:  Status post left-sided thoracentesis. EXAM: PORTABLE CHEST 1 VIEW COMPARISON:  Radiographs 09/08/2023 and 07/11/2023.  CT 03/19/2024. FINDINGS: 1448 hours. The left pleural effusion appears mildly decreased in volume compared with the most recent CT. There is mildly improved aeration of the left lung base. Persistent medial left upper lobe consolidation. The right lung is  clear. No evidence of pneumothorax. Right IJ Port-A-Cath projects to the superior cavoatrial junction. The heart size and mediastinal contours appear unchanged. IMPRESSION: 1. Mildly decreased left pleural effusion following thoracentesis. No evidence of pneumothorax. 2. Persistent medial left upper lobe consolidation. Electronically Signed   By: Elsie Perone M.D.   On: 03/26/2024 15:26   ONCOLOGY HISTORY: Biopsy confirmed diagnosis and second primary.  Patient completed cycle 4 of carboplatinum, etoposide , and Tecentriq  on May 19, 2023.  He was on maintenance Tecentriq   between June 05, 2023 and August 28, 2023.  He then received lurbinectedin  between Sep 18, 2023 and March 04, 2024.  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 CT scan also reported interval increase in pleural as well as hepatic metastasis.  Hospice and end-of-life care were once again discussed, but patient declined.  We discussed treatment options with Tarlatamab, single agent irinotecan , and clinical trial.  Tarlatamab can only be given at the [facility] cancer center, patient did not wish to pursue this option.  He would reconsider if he had no other choice.  He has elected to pursue single agent irinotecan .  Proceed with cycle 2 of treatment today.  Will include 1 L of IV fluids with each treatment.  Patient also may require G-CSF support in the future.  Return to clinic in 2 weeks for further evaluation and consideration of cycle 3.  Clinical stage IIB squamous cell carcinoma, left upper lobe lung:  Patient underwent biopsy with navigational bronchoscopy on October 19, 2018 confirming diagnosis.  Patient declined surgery and wished to pursue XRT along  with concurrent chemotherapy.  Given his stage of disease he did not receive maintenance immunotherapy.  He completed cycle 7 of weekly carboplatinum and Taxol  on January 09, 2019 and then completed XRT on January 14, 2019.   History of melanoma: Unclear stage or depth, although by report was in situ.  Patient had Mohs surgery in fall 2019.  Previous lung biopsy consistent with squamous cell carcinoma. Anxiety/depression: Chronic and unchanged.  Continue current medications as prescribed.  Continue follow-up with primary care physician as well as psychiatry at the [place]. Shortness of breath: Chronic and unchanged.  Patient underwent thoracentesis on March 26, 2024 which removed 1 L of fluid.  Continue supplemental oxygen as needed.  Appreciate pulmonary input. Pain: Patient does not complain of this today.  Continue oxycodone  as needed. Peripheral edema: Chronic and unchanged.  Patient has been instructed to increase his torsemide  to daily if his blood pressure remains above 110 systolic. Renal insufficiency: Resolved.  1 L of IV fluids with treatment as above. Hypomagnesia: Resolved.  Continue oral magnesium  supplementation.   Neutropenia: Patient's ANC is 1.1 today.  Proceed cautiously with treatment as above. Anemia: Hemoglobin continues to slowly trend down and is now 9.3.  Monitor. Thrombocytopenia: Resolved Dizziness/nausea: Continue antiemetics as prescribed.  1 L fluids of treatment as above.  Patient also return to clinic next week for an additional liter of IV fluids.   Poor appetite: Improved with 4 mg dexamethasone  daily. Hemorrhoids: Patient was given a referral to general surgery.  Patient expressed understanding and was in agreement with this plan. He also understands that He can call clinic at any time with any questions, concerns, or complaints.    Cancer Staging  Small cell lung cancer, left [facility]) Staging form: Lung, AJCC 8th Edition - Clinical stage from 02/24/2023: Stage  IVA (cT4, cN2, cM1a) - Signed by [name] [name], MD on 02/24/2023  Squamous cell carcinoma lung, left (HCC) Staging form: Lung, AJCC 8th Edition - Clinical stage from 10/27/2018: Stage IIB (cT1c, cN1, cM0) - Signed by [name] [name], MD on 11/16/2018   [name] [name], MD   04/19/2024 1:54 PM     "

## 2024-04-19 NOTE — Progress Notes (Signed)
 Patient has a bad hemorrhoid, his pain level is at an 8. The wife told me that after speaking with Josh Borders on the phone that they should probably be referred to a GI. His appetite is a little better.

## 2024-04-19 NOTE — Patient Instructions (Signed)
 [facility] - A DEPT OF . [facility]  Discharge Instructions: Thank you for choosing [facility] to provide your oncology and hematology care.  If you have a lab appointment with the Cancer Center, please go directly to the Cancer Center and check in at the registration area.  Wear comfortable clothing and clothing appropriate for easy access to any Portacath or PICC line.   We strive to give you quality time with your provider. You may need to reschedule your appointment if you arrive late (15 or more minutes).  Arriving late affects you and other patients whose appointments are after yours.  Also, if you miss three or more appointments without notifying the office, you may be dismissed from the clinic at the providers discretion.      For prescription refill requests, have your pharmacy contact our office and allow 72 hours for refills to be completed.    Today you received the following chemotherapy and/or immunotherapy agents Irinotecan       To help prevent nausea and vomiting after your treatment, we encourage you to take your nausea medication as directed.  BELOW ARE SYMPTOMS THAT SHOULD BE REPORTED IMMEDIATELY: *FEVER GREATER THAN 100.4 F (38 C) OR HIGHER *CHILLS OR SWEATING *NAUSEA AND VOMITING THAT IS NOT CONTROLLED WITH YOUR NAUSEA MEDICATION *UNUSUAL SHORTNESS OF BREATH *UNUSUAL BRUISING OR BLEEDING *URINARY PROBLEMS (pain or burning when urinating, or frequent urination) *BOWEL PROBLEMS (unusual diarrhea, constipation, pain near the anus) TENDERNESS IN MOUTH AND THROAT WITH OR WITHOUT PRESENCE OF ULCERS (sore throat, sores in mouth, or a toothache) UNUSUAL RASH, SWELLING OR PAIN  UNUSUAL VAGINAL DISCHARGE OR ITCHING   Items with * indicate a potential emergency and should be followed up as soon as possible or go to the Emergency Department if any problems should occur.  Please show the CHEMOTHERAPY ALERT CARD or IMMUNOTHERAPY  ALERT CARD at check-in to the Emergency Department and triage nurse.  Should you have questions after your visit or need to cancel or reschedule your appointment, please contact [facility] - A DEPT OF [name]  [phone] and follow the prompts.  Office hours are 8:00 a.m. to 4:30 p.m. Monday - Friday. Please note that voicemails left after 4:00 p.m. may not be returned until the following business day.  We are closed weekends and major holidays. You have access to a nurse at all times for urgent questions. Please call the main number to the clinic [phone] and follow the prompts.  For any non-urgent questions, you may also contact your provider using MyChart. We now offer e-Visits for anyone 56 and older to request care online for non-urgent symptoms. For details visit [email or web address].   Also download the MyChart app! Go to the app store, search MyChart, open the app, select [name], and log in with your MyChart username and password.  Irinotecan  Injection What is this medication? IRINOTECAN  (ir in oh TEE kan) treats some types of cancer. It works by slowing down the growth of cancer cells. This medicine may be used for other purposes; ask your health care provider or pharmacist if you have questions. COMMON BRAND NAME(S): Camptosar  What should I tell my care team before I take this medication? They need to know if you have any of these conditions: Dehydration Diarrhea Infection, especially a viral infection, such as chickenpox, cold sores, herpes Liver disease Low blood cell levels (white cells, red cells, and platelets)  Low levels of electrolytes, such as calcium, magnesium , or potassium in your blood Recent or ongoing radiation An unusual or allergic reaction to irinotecan , other medications, foods, dyes, or preservatives If you or your partner are pregnant or trying to get pregnant Breast-feeding How should I use this  medication? This medication is injected into a vein. It is given by your care team in a hospital or clinic setting. Talk to your care team about the use of this medication in children. Special care may be needed. Overdosage: If you think you have taken too much of this medicine contact a poison control center or emergency room at once. NOTE: This medicine is only for you. Do not share this medicine with others. What if I miss a dose? Keep appointments for follow-up doses. It is important not to miss your dose. Call your care team if you are unable to keep an appointment. What may interact with this medication? Do not take this medication with any of the following: Cobicistat Itraconazole This medication may also interact with the following: Certain antibiotics, such as clarithromycin, rifampin, rifabutin Certain antivirals for HIV or AIDS Certain medications for fungal infections, such as ketoconazole, posaconazole, voriconazole Certain medications for seizures, such as carbamazepine, phenobarbital, phenytoin Gemfibrozil Nefazodone St. John's wort This list may not describe all possible interactions. Give your health care provider a list of all the medicines, herbs, non-prescription drugs, or dietary supplements you use. Also tell them if you smoke, drink alcohol, or use illegal drugs. Some items may interact with your medicine. What should I watch for while using this medication? Your condition will be monitored carefully while you are receiving this medication. You may need blood work while taking this medication. This medication may make you feel generally unwell. This is not uncommon as chemotherapy can affect healthy cells as well as cancer cells. Report any side effects. Continue your course of treatment even though you feel ill unless your care team tells you to stop. This medication can cause serious side effects. To reduce the risk, your care team may give you other medications to  take before receiving this one. Be sure to follow the directions from your care team. This medication may affect your coordination, reaction time, or judgement. Do not drive or operate machinery until you know how this medication affects you. Sit up or stand slowly to reduce the risk of dizzy or fainting spells. Drinking alcohol with this medication can increase the risk of these side effects. This medication may increase your risk of getting an infection. Call your care team for advice if you get a fever, chills, sore throat, or other symptoms of a cold or flu. Do not treat yourself. Try to avoid being around people who are sick. Avoid taking medications that contain aspirin, acetaminophen , ibuprofen, naproxen , or ketoprofen unless instructed by your care team. These medications may hide a fever. This medication may increase your risk to bruise or bleed. Call your care team if you notice any unusual bleeding. Be careful brushing or flossing your teeth or using a toothpick because you may get an infection or bleed more easily. If you have any dental work done, tell your dentist you are receiving this medication. Talk to your care team if you or your partner are pregnant or think either of you might be pregnant. This medication can cause serious birth defects if taken during pregnancy and for 6 months after the last dose. You will need a negative pregnancy test before starting this medication.  Contraception is recommended while taking this medication and for 6 months after the last dose. Your care team can help you find the option that works for you. Do not father a child while taking this medication and for 3 months after the last dose. Use a condom for contraception during this time period. Do not breastfeed while taking this medication and for 7 days after the last dose. This medication may cause infertility. Talk to your care team if you are concerned about your fertility. What side effects may I notice  from receiving this medication? Side effects that you should report to your care team as soon as possible: Allergic reactions--skin rash, itching, hives, swelling of the face, lips, tongue, or throat Dry cough, shortness of breath or trouble breathing Increased saliva or tears, increased sweating, stomach cramping, diarrhea, small pupils, unusual weakness or fatigue, slow heartbeat Infection--fever, chills, cough, sore throat, wounds that don't heal, pain or trouble when passing urine, general feeling of discomfort or being unwell Kidney injury--decrease in the amount of urine, swelling of the ankles, hands, or feet Low red blood cell level--unusual weakness or fatigue, dizziness, headache, trouble breathing Severe or prolonged diarrhea Unusual bruising or bleeding Side effects that usually do not require medical attention (report to your care team if they continue or are bothersome): Constipation Diarrhea Hair loss Loss of appetite Nausea Stomach pain This list may not describe all possible side effects. Call your doctor for medical advice about side effects. You may report side effects to FDA at 1-800-FDA-1088. Where should I keep my medication? This medication is given in a hospital or clinic. It will not be stored at home. NOTE: This sheet is a summary. It may not cover all possible information. If you have questions about this medicine, talk to your doctor, pharmacist, or health care provider.  2024 Elsevier/Gold Standard (2021-08-30 00:00:00)

## 2024-05-03 ENCOUNTER — Inpatient Hospital Stay

## 2024-05-03 ENCOUNTER — Encounter: Payer: Self-pay | Admitting: Oncology

## 2024-05-03 ENCOUNTER — Inpatient Hospital Stay: Attending: Oncology

## 2024-05-03 ENCOUNTER — Inpatient Hospital Stay: Attending: Oncology | Admitting: Oncology

## 2024-05-03 VITALS — BP 108/57 | HR 56 | Temp 97.3°F | Resp 18 | Ht 72.0 in | Wt 226.0 lb

## 2024-05-03 DIAGNOSIS — Z5189 Encounter for other specified aftercare: Secondary | ICD-10-CM | POA: Diagnosis not present

## 2024-05-03 DIAGNOSIS — C3492 Malignant neoplasm of unspecified part of left bronchus or lung: Secondary | ICD-10-CM

## 2024-05-03 DIAGNOSIS — Z79634 Long term (current) use of topoisomerase inhibitor: Secondary | ICD-10-CM | POA: Diagnosis not present

## 2024-05-03 DIAGNOSIS — C7972 Secondary malignant neoplasm of left adrenal gland: Secondary | ICD-10-CM | POA: Diagnosis not present

## 2024-05-03 DIAGNOSIS — Z5111 Encounter for antineoplastic chemotherapy: Secondary | ICD-10-CM | POA: Insufficient documentation

## 2024-05-03 DIAGNOSIS — C787 Secondary malignant neoplasm of liver and intrahepatic bile duct: Secondary | ICD-10-CM | POA: Diagnosis present

## 2024-05-03 DIAGNOSIS — C3412 Malignant neoplasm of upper lobe, left bronchus or lung: Secondary | ICD-10-CM | POA: Insufficient documentation

## 2024-05-03 LAB — CBC WITH DIFFERENTIAL (CANCER CENTER ONLY)
Abs Immature Granulocytes: 0.28 K/uL — ABNORMAL HIGH (ref 0.00–0.07)
Basophils Absolute: 0 K/uL (ref 0.0–0.1)
Basophils Relative: 0 %
Eosinophils Absolute: 0 K/uL (ref 0.0–0.5)
Eosinophils Relative: 0 %
HCT: 30.2 % — ABNORMAL LOW (ref 39.0–52.0)
Hemoglobin: 9.9 g/dL — ABNORMAL LOW (ref 13.0–17.0)
Immature Granulocytes: 6 %
Lymphocytes Relative: 15 %
Lymphs Abs: 0.7 K/uL (ref 0.7–4.0)
MCH: 30.6 pg (ref 26.0–34.0)
MCHC: 32.8 g/dL (ref 30.0–36.0)
MCV: 93.2 fL (ref 80.0–100.0)
Monocytes Absolute: 0.7 K/uL (ref 0.1–1.0)
Monocytes Relative: 15 %
Neutro Abs: 2.9 K/uL (ref 1.7–7.7)
Neutrophils Relative %: 64 %
Platelet Count: 180 K/uL (ref 150–400)
RBC: 3.24 MIL/uL — ABNORMAL LOW (ref 4.22–5.81)
RDW: 17.1 % — ABNORMAL HIGH (ref 11.5–15.5)
WBC Count: 4.6 K/uL (ref 4.0–10.5)
nRBC: 0 % (ref 0.0–0.2)

## 2024-05-03 LAB — CMP (CANCER CENTER ONLY)
ALT: 19 U/L (ref 0–44)
AST: 18 U/L (ref 15–41)
Albumin: 3.2 g/dL — ABNORMAL LOW (ref 3.5–5.0)
Alkaline Phosphatase: 89 U/L (ref 38–126)
Anion gap: 17 — ABNORMAL HIGH (ref 5–15)
BUN: 28 mg/dL — ABNORMAL HIGH (ref 8–23)
CO2: 18 mmol/L — ABNORMAL LOW (ref 22–32)
Calcium: 8.6 mg/dL — ABNORMAL LOW (ref 8.9–10.3)
Chloride: 94 mmol/L — ABNORMAL LOW (ref 98–111)
Creatinine: 1.25 mg/dL — ABNORMAL HIGH (ref 0.61–1.24)
GFR, Estimated: 55 mL/min — ABNORMAL LOW
Glucose, Bld: 102 mg/dL — ABNORMAL HIGH (ref 70–99)
Potassium: 3.7 mmol/L (ref 3.5–5.1)
Sodium: 128 mmol/L — ABNORMAL LOW (ref 135–145)
Total Bilirubin: 0.4 mg/dL (ref 0.0–1.2)
Total Protein: 5.7 g/dL — ABNORMAL LOW (ref 6.5–8.1)

## 2024-05-03 LAB — MAGNESIUM: Magnesium: 1.5 mg/dL — ABNORMAL LOW (ref 1.7–2.4)

## 2024-05-03 MED ORDER — SODIUM CHLORIDE 0.9 % IV SOLN
INTRAVENOUS | Status: DC
  Filled 2024-05-03: qty 250

## 2024-05-03 MED ORDER — SODIUM CHLORIDE 0.9 % IV SOLN
Freq: Once | INTRAVENOUS | Status: AC
  Filled 2024-05-03: qty 250

## 2024-05-03 MED ORDER — DEXAMETHASONE SOD PHOSPHATE PF 10 MG/ML IJ SOLN
10.0000 mg | Freq: Once | INTRAMUSCULAR | Status: AC
  Administered 2024-05-03: 10 mg via INTRAVENOUS

## 2024-05-03 MED ORDER — PALONOSETRON HCL INJECTION 0.25 MG/5ML
0.2500 mg | Freq: Once | INTRAVENOUS | Status: AC
  Administered 2024-05-03: 0.25 mg via INTRAVENOUS
  Filled 2024-05-03: qty 5

## 2024-05-03 MED ORDER — ATROPINE SULFATE 1 MG/ML IV SOLN
0.5000 mg | Freq: Once | INTRAVENOUS | Status: AC | PRN
  Administered 2024-05-03: 0.5 mg via INTRAVENOUS
  Filled 2024-05-03: qty 1

## 2024-05-03 MED ORDER — SODIUM CHLORIDE 0.9 % IV SOLN
180.0000 mg/m2 | Freq: Once | INTRAVENOUS | Status: AC
  Administered 2024-05-03: 400 mg via INTRAVENOUS
  Filled 2024-05-03: qty 15

## 2024-05-03 MED ORDER — MAGNESIUM SULFATE 2 GM/50ML IV SOLN
2.0000 g | Freq: Once | INTRAVENOUS | Status: AC
  Administered 2024-05-03: 2 g via INTRAVENOUS
  Filled 2024-05-03: qty 50

## 2024-05-03 NOTE — Progress Notes (Signed)
 Patient is still having severe pain in rectum area, especially when he has to have a bowel movement. He rates his pain at a 10 today, and most of the time. His appetite has got a little better. He is wanting to see if Dr. [name] ever put him down to get some IV fluids today, and if Dr. [name] was still going to have him get a scan soon?  He has an appointment with a surgeon on 05/14/2023.

## 2024-05-03 NOTE — Patient Instructions (Signed)

## 2024-05-03 NOTE — Progress Notes (Signed)
 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  INTERVAL HISTORY: Patient returns to clinic today for further evaluation and consideration of cycle 3 of single agent irinotecan .  He continues to have chronic weakness and fatigue.  He has persistent rectal pain secondary to hemorrhoids.  He also has intermittent lower extremity edema.  He continues to have increased anxiety.  He denies any recent fevers.  He has no neurologic complaints.  He denies any chest pain, cough, or hemoptysis.  He has chronic shortness of breath and requires supplemental oxygen.  He denies any nausea, vomiting, constipation, or diarrhea.  He has no urinary complaints.  Patient offers no further specific complaints today.  REVIEW OF SYSTEMS:   Review of Systems  Constitutional:  Positive for malaise/fatigue. Negative for fever and weight loss.  HENT:  Negative for congestion.   Respiratory:  Positive for shortness of breath. Negative for cough and hemoptysis.   Cardiovascular:  Positive for leg swelling. Negative for chest pain.  Gastrointestinal: Negative.  Negative for abdominal pain and nausea.  Genitourinary: Negative.  Negative for dysuria.  Musculoskeletal: Negative.  Negative for back pain.  Skin:  Negative for rash.  Neurological:  Positive for weakness. Negative for dizziness, focal weakness and headaches.  Endo/Heme/Allergies:  Does not bruise/bleed easily.  Psychiatric/Behavioral:  Negative for depression. The patient is nervous/anxious.     As per HPI. Otherwise, a complete  review of systems is negative.  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 Has smoked 55 yrs.      Squamous cell carcinoma of lung, left (HCC)    Thyroid  disease    Tobacco abuse     PAST SURGICAL HISTORY: Past Surgical History:  Procedure Laterality Date   CARDIOVERSION N/A    CARDIOVERSION N/A 11/24/2021   Procedure: CARDIOVERSION;  Surgeon: Mady Bruckner, MD;  Location: [facility] ORS;  Service: Cardiovascular;  Laterality: N/A;   ELECTROMAGNETIC NAVIGATION BROCHOSCOPY Left 10/19/2018   Procedure: ELECTROMAGNETIC NAVIGATION BRONCHOSCOPY LEFT;  Surgeon: Tamea Dedra CROME, MD;  Location: [facility] OR[facility];  Service: Cardiopulmonary;  Laterality: Left;   ENDOBRONCHIAL ULTRASOUND Left 10/19/2018   Procedure: ENDOBRONCHIAL ULTRASOUND LEFT;  Surgeon: Tamea Dedra CROME, MD;  Location: [facility] OR[facility];  Service: Cardiopulmonary;  Laterality: Left;   IR IMAGING GUIDED PORT INSERTION  03/03/2023   IR THORACENTESIS ASP PLEURAL SPACE W/IMG GUIDE  03/01/2023   MELANOMA EXCISION Left    NO PAST SURGERIES      FAMILY HISTORY: Family History  Problem Relation Age of Onset   Aneurysm Mother    Cancer Father     ADVANCED DIRECTIVES (Y/N):  N  HEALTH MAINTENANCE: Social History   Tobacco Use   Smoking status: Former    Current packs/day:  0.00    Average packs/day: 0.3 packs/day for 62.0 years (15.5 ttl pk-yrs)    Types: Cigarettes    Start date: 09/04/1956    Quit date: 09/05/2018    Years since quitting: 5.6   Smokeless tobacco: Never  Vaping Use   Vaping status: Never Used  Substance Use Topics   Alcohol use: No    Alcohol/week: 0.0 standard drinks of alcohol   Drug  use: No     Colonoscopy:  PAP:  Bone density:  Lipid panel:  No Known Allergies  Current Outpatient Medications  Medication Sig Dispense Refill   albuterol  (PROVENTIL ) (2.5 MG/3ML) 0.083% nebulizer solution Take 2.5 mg by nebulization every 6 (six) hours as needed for wheezing or shortness of breath. (Patient not taking: Reported on 04/04/2024)     albuterol  (VENTOLIN  HFA) 108 (90 Base) MCG/ACT inhaler Inhale 2 puffs into the lungs every 6 (six) hours as needed for wheezing or shortness of breath. 3 each 11   apixaban  (ELIQUIS ) 2.5 MG TABS tablet Take 1 tablet (2.5 mg total) by mouth 2 (two) times daily.     carboxymethylcellulose (REFRESH PLUS) 0.5 % SOLN 1 drop 4 (four) times daily as needed.     chlorpheniramine-HYDROcodone  (TUSSIONEX) 10-8 MG/5ML Take 5 mLs by mouth every 12 (twelve) hours as needed for cough. 115 mL 0   dexamethasone  (DECADRON ) 4 MG tablet Take 1 tablet (4 mg total) by mouth daily. 30 tablet 1   finasteride (PROSCAR) 5 MG tablet Take 5 mg by mouth daily.     fluticasone -salmeterol (WIXELA INHUB) 250-50 MCG/ACT AEPB Inhale 1 puff into the lungs in the morning and at bedtime. 60 each 12   glimepiride  (AMARYL ) 2 MG tablet Take 2 mg by mouth daily with breakfast. (Patient not taking: Reported on 04/04/2024)     ipratropium-albuterol  (DUONEB) 0.5-2.5 (3) MG/3ML SOLN Take 3 mLs by nebulization every 6 (six) hours as needed. 360 mL 3   levothyroxine  (SYNTHROID ) 200 MCG tablet Take 200 mcg by mouth daily before breakfast.     Magnesium  Glycinate 120 MG CAPS Take 400 mg by mouth.     Oxycodone  HCl 10 MG TABS Take 1 tablet (10 mg total) by mouth every 6 (six) hours as needed. Okay to cut tablet in half if needed. 60 tablet 0   OXYGEN Inhale 3 L into the lungs continuous.     potassium chloride  SA (KLOR-CON  M) 20 MEQ tablet TAKE 1 TABLET BY MOUTH TWICE A DAY 180 tablet 1   prochlorperazine  (COMPAZINE ) 10 MG tablet Take 10 mg by mouth every 6 (six) hours as needed.      SEMAGLUTIDE,0.25 OR 0.5MG /DOS, [place] Inject 0.5 mg into the skin once a week.     sodium chloride  1 g tablet Take 1 tablet (1 g total) by mouth 3 (three) times daily. 90 tablet 1   tamsulosin  (FLOMAX ) 0.4 MG CAPS capsule 0.8 mg daily.     Tiotropium Bromide (SPIRIVA  RESPIMAT) 2.5 MCG/ACT AERS Inhale 2 puffs into the lungs daily. 4 g 12   torsemide  (DEMADEX ) 20 MG tablet Take 1 tablet (20 mg total) by mouth daily. 30 tablet 0   vitamin B-12 (CYANOCOBALAMIN ) 1000 MCG tablet Take 1,000 mcg by mouth daily.     No current facility-administered medications for this visit.    OBJECTIVE: Vitals:   05/03/24 0850  BP: (!) 108/57  Pulse: (!) 56  Resp: 18  Temp: (!) 97.3 F (36.3 C)  SpO2: 99%       Body  mass index is 30.65 kg/m.    ECOG FS:2 - Symptomatic, <50% confined to bed  General: Well-developed, well-nourished, no acute distress.  Sitting in a wheelchair. Eyes: Pink conjunctiva, anicteric sclera. HEENT: Normocephalic, moist mucous membranes. Lungs: No audible wheezing or coughing. Heart: Regular rate and rhythm. Abdomen: Soft, nontender, no obvious distention. Musculoskeletal: No edema, cyanosis, or clubbing. Neuro: Alert, answering all questions appropriately. Cranial nerves grossly intact. Skin: No rashes or petechiae noted. Psych: Normal affect.  LAB RESULTS:  Lab Results  Component Value Date   NA 135 04/19/2024   K 3.9 04/19/2024   CL 103 04/19/2024   CO2 21 (L) 04/19/2024   GLUCOSE 105 (H) 04/19/2024   BUN 22 04/19/2024   CREATININE 1.13 04/19/2024   CALCIUM 8.5 (L) 04/19/2024   PROT 5.5 (L) 04/19/2024   ALBUMIN 3.1 (L) 04/19/2024   AST 15 04/19/2024   ALT 14 04/19/2024   ALKPHOS 77 04/19/2024   BILITOT 0.3 04/19/2024   GFRNONAA >60 04/19/2024   GFRAA 58 (L) 07/18/2019    Lab Results  Component Value Date   WBC 4.6 05/03/2024   NEUTROABS PENDING 05/03/2024   HGB 9.9 (L) 05/03/2024   HCT 30.2 (L) 05/03/2024   MCV 93.2 05/03/2024   PLT 180 05/03/2024      STUDIES: No results found.  ONCOLOGY HISTORY: Biopsy confirmed diagnosis and second primary.  Patient completed cycle 4 of carboplatinum, etoposide , and Tecentriq  on May 19, 2023.  He was on maintenance Tecentriq  between June 05, 2023 and August 28, 2023.  He then received lurbinectedin  between Sep 18, 2023 and March 04, 2024.  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 CT scan also reported interval increase in pleural as well as hepatic metastasis.  Hospice and end-of-life care were discussed, but patient declined.  We discussed treatment options with Tarlatamab, single agent irinotecan , and clinical trial.  Tarlatamab can only be given at the [facility] cancer center, patient did not wish to pursue this option.  He would reconsider if he had no other choice.  He has elected to pursue single agent irinotecan .  Proceed with cycle 3 of treatment today.  Patient also receives 1 L of IV fluids with each treatment.  Patient also may require G-CSF support in the future.  Return to clinic in 2 weeks for further evaluation and consideration of cycle 4.  Will reimage at the conclusion of cycle 4. Clinical stage IIB squamous cell carcinoma, left upper lobe lung:  Patient underwent biopsy with navigational bronchoscopy on October 19, 2018 confirming diagnosis.  Patient declined surgery and wished to pursue XRT along with concurrent chemotherapy.  Given his stage of disease he did not receive maintenance immunotherapy.  He completed cycle 7 of weekly carboplatinum and Taxol  on January 09, 2019 and then completed XRT on January 14, 2019.   History of melanoma: Unclear stage or depth, although by report was in situ.  Patient had  Mohs surgery in fall 2019.  Previous lung biopsy consistent with squamous cell carcinoma. Anxiety/depression: Chronic and unchanged.  Continue current medications as prescribed.  Continue follow-up with primary care physician as well as psychiatry at the [facility]. Shortness of breath: Chronic and unchanged.  Patient underwent thoracentesis on March 26, 2024 which removed  1 L of fluid.  Continue supplemental oxygen as needed.  Appreciate pulmonary input. Pain: Possibly secondary to worsening hemorrhoids.  Continue oxycodone  as needed.  Patient has an appointment with surgery on May 13, 2024. Peripheral edema: Intermittent.  Hold torsemide  if systolic blood pressure is below 110.   Hyponatremia: Patient's sodium level has trended down to 128, monitor. Renal insufficiency: Creatinine has trended up slightly to 1.25. One liter fluids as above. Hypomagnesia: Magnesium  1.5 today.  Proceed with 2 g IV magnesium .  Continue oral magnesium  supplementation.   Neutropenia: Resolved.   Anemia: Chronic and unchanged.  Patient's hemoglobin is 9.9 today. Dizziness/nausea: Continue antiemetics as prescribed.  1 L fluids of treatment as above.  Poor appetite: Continue 4 mg dexamethasone  daily. Hemorrhoids: Appointment with surgery as above.   Patient expressed understanding and was in agreement with this plan. He also understands that He can call clinic at any time with any questions, concerns, or complaints.    Cancer Staging  Small cell lung cancer, left [facility]) Staging form: Lung, AJCC 8th Edition - Clinical stage from 02/24/2023: Stage IVA (cT4, cN2, cM1a) - Signed by [name] [name], MD on 02/24/2023  Squamous cell carcinoma lung, left (HCC) Staging form: Lung, AJCC 8th Edition - Clinical stage from 10/27/2018: Stage IIB (cT1c, cN1, cM0) - Signed by [name] [name], MD on 11/16/2018   [name] [name], MD   05/03/2024 9:20 AM     "

## 2024-05-03 NOTE — Patient Instructions (Signed)
 [facility] - A DEPT OF [facility]  Discharge Instructions: Thank you for choosing [facility] to provide your oncology and hematology care.  If you have a lab appointment with the Cancer Center, please go directly to the Cancer Center and check in at the registration area.  Wear comfortable clothing and clothing appropriate for easy access to any Portacath or PICC line.   We strive to give you quality time with your provider. You may need to reschedule your appointment if you arrive late (15 or more minutes).  Arriving late affects you and other patients whose appointments are after yours.  Also, if you miss three or more appointments without notifying the office, you may be dismissed from the clinic at the provider's discretion.      For prescription refill requests, have your pharmacy contact our office and allow 72 hours for refills to be completed.    Today you received the following chemotherapy and/or immunotherapy agents- irinotecan      To help prevent nausea and vomiting after your treatment, we encourage you to take your nausea medication as directed.  BELOW ARE SYMPTOMS THAT SHOULD BE REPORTED IMMEDIATELY: *FEVER GREATER THAN 100.4 F (38 C) OR HIGHER *CHILLS OR SWEATING *NAUSEA AND VOMITING THAT IS NOT CONTROLLED WITH YOUR NAUSEA MEDICATION *UNUSUAL SHORTNESS OF BREATH *UNUSUAL BRUISING OR BLEEDING *URINARY PROBLEMS (pain or burning when urinating, or frequent urination) *BOWEL PROBLEMS (unusual diarrhea, constipation, pain near the anus) TENDERNESS IN MOUTH AND THROAT WITH OR WITHOUT PRESENCE OF ULCERS (sore throat, sores in mouth, or a toothache) UNUSUAL RASH, SWELLING OR PAIN  UNUSUAL VAGINAL DISCHARGE OR ITCHING   Items with * indicate a potential emergency and should be followed up as soon as possible or go to the Emergency Department if any problems should occur.  Please show the CHEMOTHERAPY ALERT CARD or IMMUNOTHERAPY  ALERT CARD at check-in to the Emergency Department and triage nurse.  Should you have questions after your visit or need to cancel or reschedule your appointment, please contact [facility] - A DEPT OF [facility]  [phone] and follow the prompts.  Office hours are 8:00 a.m. to 4:30 p.m. Monday - Friday. Please note that voicemails left after 4:00 p.m. may not be returned until the following business day.  We are closed weekends and major holidays. You have access to a nurse at all times for urgent questions. Please call the main number to the clinic [phone] and follow the prompts.  For any non-urgent questions, you may also contact your provider using MyChart. We now offer e-Visits for anyone 22 and older to request care online for non-urgent symptoms. For details visit [email or web address].   Also download the MyChart app! Go to the app store, search "MyChart", open the app, select [facility], and log in with your MyChart username and password.

## 2024-05-06 ENCOUNTER — Inpatient Hospital Stay

## 2024-05-06 DIAGNOSIS — C3492 Malignant neoplasm of unspecified part of left bronchus or lung: Secondary | ICD-10-CM

## 2024-05-06 DIAGNOSIS — Z5111 Encounter for antineoplastic chemotherapy: Secondary | ICD-10-CM | POA: Diagnosis not present

## 2024-05-06 MED ORDER — PEGFILGRASTIM-CBQV 6 MG/0.6ML ~~LOC~~ SOSY
6.0000 mg | PREFILLED_SYRINGE | Freq: Once | SUBCUTANEOUS | Status: AC
  Administered 2024-05-06: 6 mg via SUBCUTANEOUS
  Filled 2024-05-06: qty 0.6

## 2024-05-09 ENCOUNTER — Ambulatory Visit: Payer: Self-pay | Admitting: General Surgery

## 2024-05-09 ENCOUNTER — Encounter: Payer: Self-pay | Admitting: General Surgery

## 2024-05-09 VITALS — BP 111/66 | HR 82 | Ht 68.0 in | Wt 220.0 lb

## 2024-05-09 DIAGNOSIS — K641 Second degree hemorrhoids: Secondary | ICD-10-CM | POA: Diagnosis not present

## 2024-05-09 DIAGNOSIS — K640 First degree hemorrhoids: Secondary | ICD-10-CM

## 2024-05-09 NOTE — Patient Instructions (Signed)
 Advised to pursue a goal of 25 to 30 g of fiber daily.  The majority of this may be through natural sources, advised to ensure a minimal daily fiber supplementation.  Various forms of supplements discussed.  Recommend Psyllium husk, with options of mixing with beverage or applesauce to make more tolerable. Strongly advised to consume more fluids(especially in proximity to fiber intake) and to ensure adequate hydration.   Watch color of urine to determine adequacy of hydration.  Clarity is pursued in urine output, and bowel activity that responds and corresponds to significant meal intake.   We need to avoid deferring having bowel movements, advised to take the time at the first sign of sensation, typically following meals, and in the morning.  The need to avoid more frequently, and the presence of flatus may indicate the need for bowel movement.  Do not defer for later.  Addition of MiraLAX (or its generic equivalent) may be needed ensure at least twice daily bowel movements.  If multiple doses of MiraLAX are necessary utilize them. Never skip a day... Do not tolerate a day without a bowel movement unless you are fasting.  To be regular, we must do the above EVERY day.   Soluble Fiber Dissolves in Water: Soluble fiber dissolves in water to form a gel-like substance. Slows Digestion: This type of fiber slows down digestion, which can help control blood sugar levels and lower cholesterol. Sources: Common sources include oats, beans, apples, citrus fruits, and psyllium. Benefits: Helps manage cholesterol levels. Aids in blood sugar control. Increases healthy gut bacteria, which can lower inflammation and improve digestion.  Insoluble Fiber Does Not Dissolve in Water: Insoluble fiber does not dissolve in water and remains mostly intact as it passes through the digestive system. Adds Bulk to Stool: It adds bulk to stool, which helps promote regular bowel movements and prevent constipation. Sources:  Common sources include whole grains, nuts, beans, and vegetables like cauliflower and potatoes. Benefits: Improves bowel health and regularity. Reduces the risk of colorectal conditions like hemorrhoids and diverticulitis. Supports insulin sensitivity in people with diabetes.  Both types of fiber are essential for overall health, and it's beneficial to include a 50/50 mix of both in your diet.    Hemorrhoids Hemorrhoids are swollen veins that may form: In the butt (rectum). These are called internal hemorrhoids. Around the opening of the butt (anus). These are called external hemorrhoids. Most hemorrhoids do not cause very bad problems. They often get better with changes to your lifestyle and what you eat. What are the causes? Having trouble pooping (constipation) or watery poop (diarrhea). Pushing too hard when you poop. Pregnancy. Being very overweight (obese). Sitting for too long. Riding a bike for a long time. Heavy lifting or other things that take a lot of effort. Anal sex. What are the signs or symptoms? Pain. Itching or soreness in the butt. Bleeding from the butt. Leaking poop. Swelling. One or more lumps around the opening of your butt. How is this treated? In most cases, hemorrhoids can be treated at home. You may be told to: Change what you eat. Make changes to your lifestyle. If these treatments do not help, you may need to have a procedure done. Your doctor may need to: Place rubber bands at the bottom of the hemorrhoids to make them fall off. Put medicine into the hemorrhoids to shrink them. Shine a type of light on the hemorrhoids to cause them to fall off. Do surgery to get rid of the hemorrhoids.  Follow these instructions at home: Medicines Take over-the-counter and prescription medicines only as told by your doctor. Use creams with medicine in them or medicines that you put in your butt as told by your doctor. Eating and drinking  Eat foods that have a  lot of fiber in them. These include whole grains, beans, nuts, fruits, and vegetables. Ask your doctor about taking products that have fiber added to them (fibersupplements). Take in less fat. You can do this by: Eating low-fat dairy products. Eating less red meat. Staying away from processed foods. Drink enough fluid to keep your pee (urine) pale yellow. Managing pain and swelling  Take a warm-water bath (sitz bath) for 20 minutes to ease pain. Do this 3-4 times a day. You may do this in a bathtub. You may also use a portable sitz bath that fits over the toilet. If told, put ice on the painful area. It may help to use ice between your warm baths. Put ice in a plastic bag. Place a towel between your skin and the bag. Leave the ice on for 20 minutes, 2-3 times a day. If your skin turns bright red, take off the ice right away to prevent skin damage. The risk of damage is higher if you cannot feel pain, heat, or cold. General instructions Exercise. Ask your doctor how much and what kind of exercise is best for you. Go to the bathroom when you need to poop. Do not wait. Try not to push too hard when you poop. Keep your butt dry and clean. Use wet toilet paper or moist towelettes after you poop. Do not sit on the toilet for a long time. Contact a doctor if: You have pain and swelling that do not get better with treatment. You have trouble pooping. You cannot poop. You have pain or swelling outside the area of the hemorrhoids. Get help right away if: You have bleeding from the butt that will not stop. This information is not intended to replace advice given to you by your health care provider. Make sure you discuss any questions you have with your health care provider. Document Revised: 12/29/2021 Document Reviewed: 12/29/2021 Elsevier Patient Education  2024 [employer].

## 2024-05-13 ENCOUNTER — Ambulatory Visit: Admitting: Surgery

## 2024-05-17 ENCOUNTER — Inpatient Hospital Stay

## 2024-05-17 ENCOUNTER — Encounter: Payer: Self-pay | Admitting: Oncology

## 2024-05-17 ENCOUNTER — Other Ambulatory Visit: Payer: Self-pay | Admitting: Oncology

## 2024-05-17 ENCOUNTER — Inpatient Hospital Stay: Admitting: Oncology

## 2024-05-17 ENCOUNTER — Telehealth: Payer: Self-pay | Admitting: Cardiology

## 2024-05-17 VITALS — BP 112/59 | HR 74

## 2024-05-17 VITALS — BP 112/64 | HR 69 | Temp 98.4°F | Resp 16 | Wt 225.6 lb

## 2024-05-17 DIAGNOSIS — C3492 Malignant neoplasm of unspecified part of left bronchus or lung: Secondary | ICD-10-CM

## 2024-05-17 DIAGNOSIS — Z5111 Encounter for antineoplastic chemotherapy: Secondary | ICD-10-CM | POA: Diagnosis not present

## 2024-05-17 LAB — CBC WITH DIFFERENTIAL (CANCER CENTER ONLY)
Abs Immature Granulocytes: 0.61 K/uL — ABNORMAL HIGH (ref 0.00–0.07)
Basophils Absolute: 0 K/uL (ref 0.0–0.1)
Basophils Relative: 0 %
Eosinophils Absolute: 0 K/uL (ref 0.0–0.5)
Eosinophils Relative: 0 %
HCT: 29.1 % — ABNORMAL LOW (ref 39.0–52.0)
Hemoglobin: 9.2 g/dL — ABNORMAL LOW (ref 13.0–17.0)
Immature Granulocytes: 5 %
Lymphocytes Relative: 4 %
Lymphs Abs: 0.5 K/uL — ABNORMAL LOW (ref 0.7–4.0)
MCH: 30.6 pg (ref 26.0–34.0)
MCHC: 31.6 g/dL (ref 30.0–36.0)
MCV: 96.7 fL (ref 80.0–100.0)
Monocytes Absolute: 0.5 K/uL (ref 0.1–1.0)
Monocytes Relative: 4 %
Neutro Abs: 9.8 K/uL — ABNORMAL HIGH (ref 1.7–7.7)
Neutrophils Relative %: 87 %
Platelet Count: 130 K/uL — ABNORMAL LOW (ref 150–400)
RBC: 3.01 MIL/uL — ABNORMAL LOW (ref 4.22–5.81)
RDW: 19.4 % — ABNORMAL HIGH (ref 11.5–15.5)
Smear Review: NORMAL
WBC Count: 11.4 K/uL — ABNORMAL HIGH (ref 4.0–10.5)
nRBC: 0.3 % — ABNORMAL HIGH (ref 0.0–0.2)

## 2024-05-17 LAB — CMP (CANCER CENTER ONLY)
ALT: 19 U/L (ref 0–44)
AST: 14 U/L — ABNORMAL LOW (ref 15–41)
Albumin: 3 g/dL — ABNORMAL LOW (ref 3.5–5.0)
Alkaline Phosphatase: 103 U/L (ref 38–126)
Anion gap: 11 (ref 5–15)
BUN: 27 mg/dL — ABNORMAL HIGH (ref 8–23)
CO2: 22 mmol/L (ref 22–32)
Calcium: 8.2 mg/dL — ABNORMAL LOW (ref 8.9–10.3)
Chloride: 104 mmol/L (ref 98–111)
Creatinine: 1.11 mg/dL (ref 0.61–1.24)
GFR, Estimated: >60 mL/min
Glucose, Bld: 137 mg/dL — ABNORMAL HIGH (ref 70–99)
Potassium: 3.1 mmol/L — ABNORMAL LOW (ref 3.5–5.1)
Sodium: 136 mmol/L (ref 135–145)
Total Bilirubin: 0.3 mg/dL (ref 0.0–1.2)
Total Protein: 5.2 g/dL — ABNORMAL LOW (ref 6.5–8.1)

## 2024-05-17 LAB — MAGNESIUM: Magnesium: 1.5 mg/dL — ABNORMAL LOW (ref 1.7–2.4)

## 2024-05-17 MED ORDER — ATROPINE SULFATE 1 MG/ML IV SOLN
0.5000 mg | Freq: Once | INTRAVENOUS | Status: AC | PRN
  Administered 2024-05-17: 0.5 mg via INTRAVENOUS
  Filled 2024-05-17: qty 1

## 2024-05-17 MED ORDER — SODIUM CHLORIDE 0.9 % IV SOLN
INTRAVENOUS | Status: DC
  Filled 2024-05-17: qty 250

## 2024-05-17 MED ORDER — PALONOSETRON HCL INJECTION 0.25 MG/5ML
0.2500 mg | Freq: Once | INTRAVENOUS | Status: AC
  Administered 2024-05-17: 0.25 mg via INTRAVENOUS
  Filled 2024-05-17: qty 5

## 2024-05-17 MED ORDER — SODIUM CHLORIDE 0.9 % IV SOLN
180.0000 mg/m2 | Freq: Once | INTRAVENOUS | Status: AC
  Administered 2024-05-17: 400 mg via INTRAVENOUS
  Filled 2024-05-17: qty 15

## 2024-05-17 MED ORDER — DEXAMETHASONE SOD PHOSPHATE PF 10 MG/ML IJ SOLN
10.0000 mg | Freq: Once | INTRAMUSCULAR | Status: AC
  Administered 2024-05-17: 10 mg via INTRAVENOUS
  Filled 2024-05-17: qty 1

## 2024-05-17 MED ORDER — SODIUM CHLORIDE 0.9 % IV SOLN
Freq: Once | INTRAVENOUS | Status: AC
  Filled 2024-05-17: qty 250

## 2024-05-17 NOTE — Patient Instructions (Signed)
 [facility] - A DEPT OF [facility]  Discharge Instructions: Thank you for choosing [facility] to provide your oncology and hematology care.  If you have a lab appointment with the Cancer Center, please go directly to the Cancer Center and check in at the registration area.  Wear comfortable clothing and clothing appropriate for easy access to any Portacath or PICC line.   We strive to give you quality time with your provider. You may need to reschedule your appointment if you arrive late (15 or more minutes).  Arriving late affects you and other patients whose appointments are after yours.  Also, if you miss three or more appointments without notifying the office, you may be dismissed from the clinic at the provider's discretion.      For prescription refill requests, have your pharmacy contact our office and allow 72 hours for refills to be completed.    Today you received the following chemotherapy and/or immunotherapy agents- irinotecan      To help prevent nausea and vomiting after your treatment, we encourage you to take your nausea medication as directed.  BELOW ARE SYMPTOMS THAT SHOULD BE REPORTED IMMEDIATELY: *FEVER GREATER THAN 100.4 F (38 C) OR HIGHER *CHILLS OR SWEATING *NAUSEA AND VOMITING THAT IS NOT CONTROLLED WITH YOUR NAUSEA MEDICATION *UNUSUAL SHORTNESS OF BREATH *UNUSUAL BRUISING OR BLEEDING *URINARY PROBLEMS (pain or burning when urinating, or frequent urination) *BOWEL PROBLEMS (unusual diarrhea, constipation, pain near the anus) TENDERNESS IN MOUTH AND THROAT WITH OR WITHOUT PRESENCE OF ULCERS (sore throat, sores in mouth, or a toothache) UNUSUAL RASH, SWELLING OR PAIN  UNUSUAL VAGINAL DISCHARGE OR ITCHING   Items with * indicate a potential emergency and should be followed up as soon as possible or go to the Emergency Department if any problems should occur.  Please show the CHEMOTHERAPY ALERT CARD or IMMUNOTHERAPY  ALERT CARD at check-in to the Emergency Department and triage nurse.  Should you have questions after your visit or need to cancel or reschedule your appointment, please contact [facility] - A DEPT OF [facility]  [phone] and follow the prompts.  Office hours are 8:00 a.m. to 4:30 p.m. Monday - Friday. Please note that voicemails left after 4:00 p.m. may not be returned until the following business day.  We are closed weekends and major holidays. You have access to a nurse at all times for urgent questions. Please call the main number to the clinic [phone] and follow the prompts.  For any non-urgent questions, you may also contact your provider using MyChart. We now offer e-Visits for anyone 22 and older to request care online for non-urgent symptoms. For details visit [email or web address].   Also download the MyChart app! Go to the app store, search "MyChart", open the app, select [facility], and log in with your MyChart username and password.

## 2024-05-17 NOTE — Progress Notes (Signed)
 Patient complains of sore throat and respiratory symptoms.

## 2024-05-17 NOTE — Telephone Encounter (Signed)
 Have been suffering with extreme nosey bleeds due to chemo stopped eliquis  last Saturday how long can he go with out eliquis . Please call pt [phone] with instructions

## 2024-05-17 NOTE — Progress Notes (Signed)
 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  INTERVAL HISTORY: Patient returns to clinic today for further evaluation and consideration of cycle 4 of single agent irinotecan .  He was recently seen at the [facility] and initiated on antibiotics for pharyngitis.  He denies any fevers.  He has mild pain secondary to broken tooth.  He has intermittent nosebleeds and has self discontinued his Eliquis .  He continues to have problems with hemorrhoids.  He has chronic weakness and fatigue.  He continues to have increased anxiety.  He denies any recent fevers.  He has no neurologic complaints.  He denies any chest pain, cough, or hemoptysis.  He has chronic shortness of breath and requires supplemental oxygen.  He denies any nausea, vomiting, constipation, or diarrhea.  He has periodic hematochezia.  He has no urinary complaints.  Patient offers no further specific complaints today.  REVIEW OF SYSTEMS:   Review of Systems  Constitutional:  Positive for malaise/fatigue. Negative for fever and weight loss.  HENT:  Positive for nosebleeds and sore throat. Negative for congestion.   Respiratory:  Positive for shortness of breath. Negative for cough and hemoptysis.   Cardiovascular:  Positive for leg swelling. Negative for chest pain.  Gastrointestinal: Negative.  Negative for abdominal pain and nausea.  Genitourinary: Negative.  Negative for dysuria.  Musculoskeletal: Negative.  Negative for back pain.  Skin:  Negative for rash.  Neurological:  Positive for weakness. Negative for  dizziness, focal weakness and headaches.  Endo/Heme/Allergies:  Does not bruise/bleed easily.  Psychiatric/Behavioral:  Negative for depression. The patient is nervous/anxious.     As per HPI. Otherwise, a complete review of systems is negative.  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 Has smoked 55 yrs.      Squamous cell carcinoma of lung, left (HCC)    Thyroid  disease    Tobacco abuse     PAST SURGICAL HISTORY: Past Surgical History:  Procedure Laterality Date   CARDIOVERSION N/A    CARDIOVERSION N/A 11/24/2021   Procedure: CARDIOVERSION;  Surgeon: Mady Bruckner, MD;  Location: [facility] ORS;  Service: Cardiovascular;  Laterality: N/A;   ELECTROMAGNETIC NAVIGATION BROCHOSCOPY Left 10/19/2018   Procedure: ELECTROMAGNETIC NAVIGATION BRONCHOSCOPY LEFT;  Surgeon: Tamea Dedra CROME, MD;  Location: [facility] ORS;  Service: Cardiopulmonary;  Laterality: Left;   ENDOBRONCHIAL ULTRASOUND Left 10/19/2018   Procedure: ENDOBRONCHIAL ULTRASOUND LEFT;  Surgeon: Tamea Dedra CROME, MD;  Location: [facility] ORS;  Service: Cardiopulmonary;  Laterality: Left;   IR IMAGING GUIDED PORT INSERTION  03/03/2023   IR THORACENTESIS RIGHT ASP PLEURAL SPACE W/IMG GUIDE  03/01/2023   MELANOMA EXCISION Left    NO PAST SURGERIES      FAMILY HISTORY: Family History  Problem Relation  Age of Onset   Aneurysm Mother    Cancer Father     ADVANCED DIRECTIVES (Y/N):  N  HEALTH MAINTENANCE: Social History   Tobacco Use   Smoking status: Former    Current packs/day: 0.00    Average packs/day: 0.3 packs/day for 62.0 years (15.5 ttl pk-yrs)    Types: Cigarettes    Start date: 09/04/1956     Quit date: 09/05/2018    Years since quitting: 5.7   Smokeless tobacco: Never  Vaping Use   Vaping status: Never Used  Substance Use Topics   Alcohol use: No    Alcohol/week: 0.0 standard drinks of alcohol   Drug use: No     Colonoscopy:  PAP:  Bone density:  Lipid panel:  No Known Allergies  Current Outpatient Medications  Medication Sig Dispense Refill   albuterol  (VENTOLIN  HFA) 108 (90 Base) MCG/ACT inhaler Inhale 2 puffs into the lungs every 6 (six) hours as needed for wheezing or shortness of breath. 3 each 11   amoxicillin  (AMOXIL ) 500 MG capsule Take 500 mg by mouth.     apixaban  (ELIQUIS ) 2.5 MG TABS tablet Take 1 tablet (2.5 mg total) by mouth 2 (two) times daily.     carboxymethylcellulose (REFRESH PLUS) 0.5 % SOLN 1 drop 4 (four) times daily as needed.     chlorpheniramine-HYDROcodone  (TUSSIONEX) 10-8 MG/5ML Take 5 mLs by mouth every 12 (twelve) hours as needed for cough. 115 mL 0   dexamethasone  (DECADRON ) 4 MG tablet Take 1 tablet (4 mg total) by mouth daily. 30 tablet 1   finasteride (PROSCAR) 5 MG tablet Take 5 mg by mouth daily.     fluticasone -salmeterol (WIXELA INHUB) 250-50 MCG/ACT AEPB Inhale 1 puff into the lungs in the morning and at bedtime. 60 each 12   ipratropium-albuterol  (DUONEB) 0.5-2.5 (3) MG/3ML SOLN Take 3 mLs by nebulization every 6 (six) hours as needed. 360 mL 3   levothyroxine  (SYNTHROID ) 200 MCG tablet Take 200 mcg by mouth daily before breakfast.     Magnesium  Glycinate 120 MG CAPS Take 400 mg by mouth.     Oxycodone  HCl 10 MG TABS Take 1 tablet (10 mg total) by mouth every 6 (six) hours as needed. Okay to cut tablet in half if needed. 60 tablet 0   OXYGEN Inhale 3 L into the lungs continuous.     potassium chloride  SA (KLOR-CON  M) 20 MEQ tablet TAKE 1 TABLET BY MOUTH TWICE A DAY 180 tablet 1   prochlorperazine  (COMPAZINE ) 10 MG tablet Take 10 mg by mouth every 6 (six) hours as needed.     SEMAGLUTIDE,0.25 OR 0.5MG /DOS, [place] Inject 0.5 mg into  the skin once a week.     sodium chloride  1 g tablet Take 1 tablet (1 g total) by mouth 3 (three) times daily. 90 tablet 1   tamsulosin  (FLOMAX ) 0.4 MG CAPS capsule 0.8 mg daily.     Tiotropium Bromide (SPIRIVA  RESPIMAT) 2.5 MCG/ACT AERS Inhale 2 puffs into the lungs daily. 4 g 12   torsemide  (DEMADEX ) 20 MG tablet Take 1 tablet (20 mg total) by mouth daily. 30 tablet 0   vitamin B-12 (CYANOCOBALAMIN ) 1000 MCG tablet Take 1,000 mcg by mouth daily.     albuterol  (PROVENTIL ) (2.5 MG/3ML) 0.083% nebulizer solution Take 2.5 mg by nebulization every 6 (six) hours as needed for wheezing or shortness of breath. (Patient not taking: Reported on 05/17/2024)     glimepiride  (AMARYL ) 2 MG tablet Take 2 mg by mouth daily with breakfast. (  Patient not taking: Reported on 05/17/2024)     No current facility-administered medications for this visit.   Facility-Administered Medications Ordered in Other Visits  Medication Dose Route Frequency Provider Last Rate Last Admin   0.9 %  sodium chloride  infusion   Intravenous Continuous [name], Salem Mastrogiovanni J, MD       0.9 %  sodium chloride  infusion   Intravenous Once [name], Shatiqua Heroux J, MD       dexamethasone  (DECADRON ) injection 10 mg  10 mg Intravenous Once Desirai Traxler J, MD       irinotecan  (CAMPTOSAR ) 400 mg in sodium chloride  0.9 % 500 mL chemo infusion  180 mg/m2 (Treatment Plan Recorded) Intravenous Once Marquesa Rath J, MD       palonosetron  (ALOXI ) injection 0.25 mg  0.25 mg Intravenous Once [name] [name], MD        OBJECTIVE: Vitals:   05/17/24 0840  BP: 112/64  Pulse: 69  Resp: 16  Temp: 98.4 F (36.9 C)  SpO2: 97%       Body mass index is 34.3 kg/m.    ECOG FS:2 - Symptomatic, <50% confined to bed  General: Well-developed, well-nourished, no acute distress.  Sitting in a wheelchair.  Eyes: Pink conjunctiva, anicteric sclera. HEENT: Normocephalic, moist mucous membranes. Lungs: No audible wheezing or coughing. Heart: Regular rate  and rhythm. Abdomen: Soft, nontender, no obvious distention. Musculoskeletal: No edema, cyanosis, or clubbing. Neuro: Alert, answering all questions appropriately. Cranial nerves grossly intact. Skin: No rashes or petechiae noted. Psych: Normal affect.  LAB RESULTS:  Lab Results  Component Value Date   NA 136 05/17/2024   K 3.1 (L) 05/17/2024   CL 104 05/17/2024   CO2 22 05/17/2024   GLUCOSE 137 (H) 05/17/2024   BUN 27 (H) 05/17/2024   CREATININE 1.11 05/17/2024   CALCIUM 8.2 (L) 05/17/2024   PROT 5.2 (L) 05/17/2024   ALBUMIN 3.0 (L) 05/17/2024   AST 14 (L) 05/17/2024   ALT 19 05/17/2024   ALKPHOS 103 05/17/2024   BILITOT 0.3 05/17/2024   GFRNONAA >60 05/17/2024   GFRAA 58 (L) 07/18/2019    Lab Results  Component Value Date   WBC 11.4 (H) 05/17/2024   NEUTROABS 9.8 (H) 05/17/2024   HGB 9.2 (L) 05/17/2024   HCT 29.1 (L) 05/17/2024   MCV 96.7 05/17/2024   PLT 130 (L) 05/17/2024     STUDIES: No results found.  ONCOLOGY HISTORY: Biopsy confirmed diagnosis and second primary.  Patient completed cycle 4 of carboplatinum, etoposide , and Tecentriq  on May 19, 2023.  He was on maintenance Tecentriq  between June 05, 2023 and August 28, 2023.  He then received lurbinectedin  between Sep 18, 2023 and March 04, 2024.  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 CT scan also reported interval increase in pleural as well as hepatic metastasis.  Hospice and end-of-life care were discussed, but patient declined.  We discussed treatment options with Tarlatamab, single agent irinotecan , and clinical trial.  Tarlatamab can only be given at the [facility] cancer center, patient did not wish to pursue this  option.  He would reconsider if he had no other choice.  He has elected to pursue single agent irinotecan .  Proceed with cycle 4 of treatment today.  Patient  also receives 1 L of fluids disease treatment.  Return to clinic in 2 weeks for further evaluation and consideration of cycle 5.  Will reimage with CT scan prior to next treatment.   Clinical stage IIB squamous cell carcinoma, left upper lobe lung:  Patient underwent biopsy with navigational bronchoscopy on October 19, 2018 confirming diagnosis.  Patient declined surgery and wished to pursue XRT along with concurrent chemotherapy.  Given his stage of disease he did not receive maintenance immunotherapy.  He completed cycle 7 of weekly carboplatinum and Taxol  on January 09, 2019 and then completed XRT on January 14, 2019.   History of melanoma: Unclear stage or depth, although by report was in situ.  Patient had Mohs surgery in fall 2019.  Previous lung biopsy consistent with squamous cell carcinoma. Anxiety/depression: Chronic and unchanged.  Continue current medications as prescribed.  Continue follow-up with primary care physician as well as psychiatry at the [facility]. Shortness of breath: Chronic and unchanged.  Patient underwent thoracentesis on March 26, 2024 which removed 1 L of fluid.  Continue supplemental oxygen as needed.  Appreciate pulmonary input. Pain: Possibly secondary to worsening hemorrhoids.  Continue oxycodone  as needed.  Follow-up with surgery as indicated. Peripheral edema: Intermittent.  Hold torsemide  if systolic blood pressure is below 110.   Hyponatremia: Resolved. Hypokalemia: Patient's potassium levels dropped to 3.1.  Monitor. Renal insufficiency: Resolved.  Patient's creatinine is within normal limits today.   Hypomagnesia: Chronic and unchanged.  Patient's magnesium  is 1.5 today.  Proceed with 2 g IV magnesium .  Continue oral magnesium  supplementation.   Leukocytosis: Likely reactive, monitor. Anemia: Hemoglobin has  trended down slightly to 9.2.  Proceed with treatment as above. Thrombocytopenia: Patient's platelet count dropped to 130.  Proceed with treatment as above. Dizziness/nausea: Patient does not complain of this today.  Continue antiemetics as prescribed.  1 L fluids of treatment as above.  Poor appetite: Improved.  Continue 4 mg dexamethasone  daily. Hemorrhoids: Appointment with surgery as above. Pharyngitis: [facility]. Chipped tooth: Recommended patient follow-up with dentistry.   Patient expressed understanding and was in agreement with this plan. He also understands that He can call clinic at any time with any questions, concerns, or complaints.    Cancer Staging  Small cell lung cancer, left [facility]) Staging form: Lung, AJCC 8th Edition - Clinical stage from 02/24/2023: Stage IVA (cT4, cN2, cM1a) - Signed by [name] [name], MD on 02/24/2023  Squamous cell carcinoma lung, left (HCC) Staging form: Lung, AJCC 8th Edition - Clinical stage from 10/27/2018: Stage IIB (cT1c, cN1, cM0) - Signed by [name] [name], MD on 11/16/2018   [name] [name], MD   05/17/2024 9:50 AM     "

## 2024-05-20 ENCOUNTER — Inpatient Hospital Stay

## 2024-05-20 DIAGNOSIS — Z5111 Encounter for antineoplastic chemotherapy: Secondary | ICD-10-CM | POA: Diagnosis not present

## 2024-05-20 DIAGNOSIS — C3492 Malignant neoplasm of unspecified part of left bronchus or lung: Secondary | ICD-10-CM

## 2024-05-20 MED ORDER — PEGFILGRASTIM-CBQV 6 MG/0.6ML ~~LOC~~ SOSY
6.0000 mg | PREFILLED_SYRINGE | Freq: Once | SUBCUTANEOUS | Status: AC
  Administered 2024-05-20: 6 mg via SUBCUTANEOUS
  Filled 2024-05-20: qty 0.6

## 2024-05-20 NOTE — Progress Notes (Signed)
 Patient ID: Levi [name], male   DOB: 1941-04-24, 84 y.o.   MRN: [id number] CC: Hemorrhoids History of Present Illness Levi [name] is a 84 y.o. male with significant past medical history for small cell lung cancer on active chemotherapy, COPD on 3 L nasal cannula and history of A-fib who is on Eliquis  who presents in consultation for hemorrhoids.  The patient reports that he has pain and bleeding with bowel movements.  He says that the bleeding is bright red blood per rectum.  He says that he has to stay on the toilet for 30 minutes at least to have a bowel movement.  He says that he does not take any fiber supplementation or laxatives.  He has been using over-the-counter medications to help with the pain.  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 Has smoked 55 yrs.      Squamous cell carcinoma of lung, left (HCC)    Thyroid  disease    Tobacco abuse        Past Surgical History:  Procedure Laterality Date   CARDIOVERSION N/A    CARDIOVERSION N/A 11/24/2021   Procedure: CARDIOVERSION;  Surgeon: Mady Bruckner, MD;  Location: [facility] ORS;  Service: Cardiovascular;  Laterality: N/A;   ELECTROMAGNETIC NAVIGATION BROCHOSCOPY Left 10/19/2018   Procedure: ELECTROMAGNETIC NAVIGATION BRONCHOSCOPY LEFT;  Surgeon: Tamea Dedra CROME, MD;  Location: [facility] ORS;  Service: Cardiopulmonary;  Laterality: Left;   ENDOBRONCHIAL ULTRASOUND Left 10/19/2018   Procedure: ENDOBRONCHIAL ULTRASOUND LEFT;  Surgeon: Tamea Dedra CROME, MD;  Location: [facility] ORS;  Service: Cardiopulmonary;  Laterality: Left;   IR IMAGING GUIDED PORT INSERTION  03/03/2023    IR THORACENTESIS RIGHT ASP PLEURAL SPACE W/IMG GUIDE  03/01/2023   MELANOMA EXCISION Left    NO PAST SURGERIES      Allergies[1]  Current Outpatient Medications  Medication Sig Dispense Refill   albuterol  (PROVENTIL ) (2.5 MG/3ML) 0.083% nebulizer solution Take 2.5 mg by nebulization every 6 (six) hours as needed for wheezing or shortness of breath. (Patient not taking: Reported on 05/17/2024)     albuterol  (VENTOLIN  HFA) 108 (90 Base) MCG/ACT inhaler Inhale 2 puffs into the lungs every 6 (six) hours as needed for wheezing or shortness of breath. 3 each 11   amoxicillin  (AMOXIL ) 500 MG capsule Take 500 mg by mouth.     apixaban  (ELIQUIS ) 2.5 MG TABS tablet Take 1 tablet (2.5 mg total) by mouth 2 (two) times daily.     carboxymethylcellulose (REFRESH PLUS) 0.5 % SOLN 1 drop 4 (four) times daily as needed.     chlorpheniramine-HYDROcodone  (TUSSIONEX) 10-8 MG/5ML Take 5 mLs by mouth every 12 (twelve) hours as needed for cough. 115 mL 0   dexamethasone  (DECADRON ) 4 MG tablet Take 1 tablet (4 mg total) by mouth daily. 30 tablet 1   finasteride (PROSCAR) 5 MG tablet Take 5 mg by mouth daily.     fluticasone -salmeterol (WIXELA INHUB) 250-50 MCG/ACT AEPB Inhale 1 puff into the lungs in the morning and at bedtime. 60 each 12   glimepiride  (AMARYL ) 2 MG  tablet Take 2 mg by mouth daily with breakfast. (Patient not taking: Reported on 05/17/2024)     ipratropium-albuterol  (DUONEB) 0.5-2.5 (3) MG/3ML SOLN Take 3 mLs by nebulization every 6 (six) hours as needed. 360 mL 3   levothyroxine  (SYNTHROID ) 200 MCG tablet Take 200 mcg by mouth daily before breakfast.     Magnesium  Glycinate 120 MG CAPS Take 400 mg by mouth.     Oxycodone  HCl 10 MG TABS Take 1 tablet (10 mg total) by mouth every 6 (six) hours as needed. Okay to cut tablet in half if needed. 60 tablet 0   OXYGEN Inhale 3 L into the lungs continuous.     potassium chloride  SA (KLOR-CON  M) 20 MEQ tablet TAKE 1 TABLET BY MOUTH TWICE A DAY 180 tablet 1    prochlorperazine  (COMPAZINE ) 10 MG tablet Take 10 mg by mouth every 6 (six) hours as needed.     SEMAGLUTIDE,0.25 OR 0.5MG /DOS, [place] Inject 0.5 mg into the skin once a week.     sodium chloride  1 g tablet Take 1 tablet (1 g total) by mouth 3 (three) times daily. 90 tablet 1   tamsulosin  (FLOMAX ) 0.4 MG CAPS capsule 0.8 mg daily.     Tiotropium Bromide (SPIRIVA  RESPIMAT) 2.5 MCG/ACT AERS Inhale 2 puffs into the lungs daily. 4 g 12   torsemide  (DEMADEX ) 20 MG tablet Take 1 tablet (20 mg total) by mouth daily. 30 tablet 0   vitamin B-12 (CYANOCOBALAMIN ) 1000 MCG tablet Take 1,000 mcg by mouth daily.     No current facility-administered medications for this visit.    Family History Family History  Problem Relation Age of Onset   Aneurysm Mother    Cancer Father        Social History Social History[2]   Previous smoker   ROS Full ROS of systems performed and is otherwise negative there than what is stated in the HPI  Physical Exam Blood pressure 111/66, pulse 82, height 5' 8 (1.727 m), weight 220 lb (99.8 kg), SpO2 97%.  Alert and oriented x 3, on nasal cannula, regular rate and rhythm, abdomen is soft, obese, nontender nondistended, rectal exam performed in the presence of a chaperone.  On external exam there are no gross lesions or hemorrhoids.  On digital rectal exam there is no gross lesions or blood.  Anoscopy exam performed and he does have grade 1 hemorrhoids that are friable with some bleeding.  Data Reviewed Reviewed oncology notes and he is on active chemotherapy.  I have personally reviewed the patient's imaging and medical records.    Assessment    Patient with grade 1-2 hemorrhoids.  He does have some bleeding from the hemorrhoids on anoscopy exam.  He does not have good bowel habits as he sits for prolonged time on the toilet has history of constipation and does not take any fiber supplementation.  Plan  Given his serious comorbidities I think surgery would be  high risk for him.  Also he has small hemorrhoids.  At this time we will provide symptomatic relief with [facility].  I gave him several samples from our office.  I also recommended that he start a fiber supplementation and take MiraLAX as needed to help with the constipation.  We will see him again in 8 weeks to see how he is doing.  A total of 47 minutes was spent reviewing the patient's chart, performing history and physical and discussing treatment options with the patient.  This was time spent irrespective of procedure.  Jayson MALVA Endow      [1] No Known Allergies [2]  Social History Tobacco Use   Smoking status: Former    Current packs/day: 0.00    Average packs/day: 0.3 packs/day for 62.0 years (15.5 ttl pk-yrs)    Types: Cigarettes    Start date: 09/04/1956    Quit date: 09/05/2018    Years since quitting: 5.7   Smokeless tobacco: Never  Vaping Use   Vaping status: Never Used  Substance Use Topics   Alcohol use: No    Alcohol/week: 0.0 standard drinks of alcohol   Drug use: No

## 2024-05-24 ENCOUNTER — Ambulatory Visit
Admission: RE | Admit: 2024-05-24 | Discharge: 2024-05-24 | Disposition: A | Source: Ambulatory Visit | Attending: Oncology | Admitting: Oncology

## 2024-05-24 DIAGNOSIS — C3492 Malignant neoplasm of unspecified part of left bronchus or lung: Secondary | ICD-10-CM | POA: Insufficient documentation

## 2024-05-24 MED ORDER — IOHEXOL 300 MG/ML  SOLN
100.0000 mL | Freq: Once | INTRAMUSCULAR | Status: AC | PRN
  Administered 2024-05-24: 100 mL via INTRAVENOUS

## 2024-05-30 ENCOUNTER — Telehealth: Payer: Self-pay | Admitting: Oncology

## 2024-05-30 ENCOUNTER — Other Ambulatory Visit: Payer: Self-pay | Admitting: Oncology

## 2024-05-30 DIAGNOSIS — C3492 Malignant neoplasm of unspecified part of left bronchus or lung: Secondary | ICD-10-CM

## 2024-05-30 NOTE — Telephone Encounter (Signed)
 Pt spouse called to r/s appts for 1/30 and 2/2 - said they can't get out of their driveway due to the ice and snow - told pt spouse that we will need new orders for the appts, but that we will call pt spouse number to r/s when the new orders are in - [facility]

## 2024-05-31 ENCOUNTER — Inpatient Hospital Stay

## 2024-05-31 ENCOUNTER — Inpatient Hospital Stay: Admitting: Oncology

## 2024-06-03 ENCOUNTER — Inpatient Hospital Stay

## 2024-06-07 ENCOUNTER — Inpatient Hospital Stay

## 2024-06-07 ENCOUNTER — Inpatient Hospital Stay: Attending: Oncology | Admitting: Oncology

## 2024-06-07 ENCOUNTER — Inpatient Hospital Stay: Attending: Oncology

## 2024-06-07 ENCOUNTER — Other Ambulatory Visit: Payer: Self-pay | Admitting: Oncology

## 2024-06-07 ENCOUNTER — Encounter: Payer: Self-pay | Admitting: Oncology

## 2024-06-07 VITALS — BP 113/56 | HR 65

## 2024-06-07 VITALS — BP 91/59 | HR 76 | Temp 98.3°F | Resp 28 | Ht 68.0 in | Wt 223.0 lb

## 2024-06-07 DIAGNOSIS — C3492 Malignant neoplasm of unspecified part of left bronchus or lung: Secondary | ICD-10-CM

## 2024-06-07 LAB — CMP (CANCER CENTER ONLY)
ALT: 14 U/L (ref 0–44)
AST: 24 U/L (ref 15–41)
Albumin: 2.8 g/dL — ABNORMAL LOW (ref 3.5–5.0)
Alkaline Phosphatase: 95 U/L (ref 38–126)
Anion gap: 15 (ref 5–15)
BUN: 18 mg/dL (ref 8–23)
CO2: 16 mmol/L — ABNORMAL LOW (ref 22–32)
Calcium: 8.2 mg/dL — ABNORMAL LOW (ref 8.9–10.3)
Chloride: 97 mmol/L — ABNORMAL LOW (ref 98–111)
Creatinine: 1.04 mg/dL (ref 0.61–1.24)
GFR, Estimated: >60 mL/min
Glucose, Bld: 112 mg/dL — ABNORMAL HIGH (ref 70–99)
Potassium: 4.2 mmol/L (ref 3.5–5.1)
Sodium: 129 mmol/L — ABNORMAL LOW (ref 135–145)
Total Bilirubin: 0.5 mg/dL (ref 0.0–1.2)
Total Protein: 5.2 g/dL — ABNORMAL LOW (ref 6.5–8.1)

## 2024-06-07 LAB — CBC WITH DIFFERENTIAL (CANCER CENTER ONLY)
Abs Immature Granulocytes: 0.31 10*3/uL — ABNORMAL HIGH (ref 0.00–0.07)
Basophils Absolute: 0.1 10*3/uL (ref 0.0–0.1)
Basophils Relative: 1 %
Eosinophils Absolute: 0 10*3/uL (ref 0.0–0.5)
Eosinophils Relative: 0 %
HCT: 29.1 % — ABNORMAL LOW (ref 39.0–52.0)
Hemoglobin: 9.3 g/dL — ABNORMAL LOW (ref 13.0–17.0)
Immature Granulocytes: 4 %
Lymphocytes Relative: 8 %
Lymphs Abs: 0.6 10*3/uL — ABNORMAL LOW (ref 0.7–4.0)
MCH: 30.9 pg (ref 26.0–34.0)
MCHC: 32 g/dL (ref 30.0–36.0)
MCV: 96.7 fL (ref 80.0–100.0)
Monocytes Absolute: 1.2 10*3/uL — ABNORMAL HIGH (ref 0.1–1.0)
Monocytes Relative: 17 %
Neutro Abs: 5.1 10*3/uL (ref 1.7–7.7)
Neutrophils Relative %: 70 %
Platelet Count: 204 10*3/uL (ref 150–400)
RBC: 3.01 MIL/uL — ABNORMAL LOW (ref 4.22–5.81)
RDW: 20.5 % — ABNORMAL HIGH (ref 11.5–15.5)
WBC Count: 7.3 10*3/uL (ref 4.0–10.5)
nRBC: 0 % (ref 0.0–0.2)

## 2024-06-07 LAB — MAGNESIUM: Magnesium: 1.7 mg/dL (ref 1.7–2.4)

## 2024-06-07 MED ORDER — DEXAMETHASONE SOD PHOSPHATE PF 10 MG/ML IJ SOLN
10.0000 mg | Freq: Once | INTRAMUSCULAR | Status: DC
  Filled 2024-06-07: qty 1

## 2024-06-07 MED ORDER — SODIUM CHLORIDE 0.9 % IV SOLN
Freq: Once | INTRAVENOUS | Status: AC
  Filled 2024-06-07: qty 250

## 2024-06-07 MED ORDER — SODIUM CHLORIDE 0.9 % IV SOLN
INTRAVENOUS | Status: DC
  Filled 2024-06-07: qty 250

## 2024-06-07 MED ORDER — PALONOSETRON HCL INJECTION 0.25 MG/5ML
0.2500 mg | Freq: Once | INTRAVENOUS | Status: DC
  Filled 2024-06-07: qty 5

## 2024-06-07 MED ORDER — SODIUM CHLORIDE 0.9 % IV SOLN
180.0000 mg/m2 | Freq: Once | INTRAVENOUS | Status: DC
  Filled 2024-06-07: qty 20

## 2024-06-07 MED ORDER — ATROPINE SULFATE 1 MG/ML IV SOLN
0.5000 mg | Freq: Once | INTRAVENOUS | Status: DC | PRN
  Filled 2024-06-07: qty 1

## 2024-06-07 NOTE — Addendum Note (Signed)
 Addended by: [name] [name] on: 06/07/2024 10:24 AM   Modules accepted: Orders

## 2024-06-07 NOTE — Progress Notes (Signed)
 He states having more trouble with going to the bathroom and being able to control bis bowels. Reports having a cold and feeling bad for 4 days, wife was sick recently. 05/24/24 CT.

## 2024-06-07 NOTE — Progress Notes (Addendum)
 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  INTERVAL HISTORY: Patient returns to clinic today for further evaluation and consideration of cycle 5 of single agent irinotecan .  He is with his son today who reports patient is declining with a decreased performance status.  He continues to have persistent congestion, but denies fevers. He continues to have increased anxiety.  He has no neurologic complaints.  He denies any chest pain, cough, or hemoptysis.  He has chronic shortness of breath and requires supplemental oxygen.  He denies any nausea, vomiting, constipation, or diarrhea.  He has periodic hematochezia.  He has no urinary complaints.  Patient offers no further specific complaints today.  REVIEW OF SYSTEMS:   Review of Systems  Constitutional:  Positive for malaise/fatigue. Negative for fever and weight loss.  HENT:  Positive for congestion and sore throat. Negative for nosebleeds.   Respiratory:  Positive for shortness of breath. Negative for cough and hemoptysis.   Cardiovascular:  Positive for leg swelling. Negative for chest pain.  Gastrointestinal: Negative.  Negative for abdominal pain and nausea.  Genitourinary: Negative.  Negative for dysuria.  Musculoskeletal: Negative.  Negative for back pain.  Skin:  Negative for rash.  Neurological:  Positive for weakness. Negative for dizziness, focal weakness and headaches.  Endo/Heme/Allergies:  Does not bruise/bleed easily.  Psychiatric/Behavioral:  Negative for depression. The patient is nervous/anxious.      As per HPI. Otherwise, a complete review of systems is negative.  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 Has smoked 55 yrs.      Squamous cell carcinoma of lung, left (HCC)    Thyroid  disease    Tobacco abuse     PAST SURGICAL HISTORY: Past Surgical History:  Procedure Laterality Date   CARDIOVERSION N/A    CARDIOVERSION N/A 11/24/2021   Procedure: CARDIOVERSION;  Surgeon: Mady Bruckner, MD;  Location: [facility] ORS;  Service: Cardiovascular;  Laterality: N/A;   ELECTROMAGNETIC NAVIGATION BROCHOSCOPY Left 10/19/2018   Procedure: ELECTROMAGNETIC NAVIGATION BRONCHOSCOPY LEFT;  Surgeon: Tamea Dedra CROME, MD;  Location: [facility] OR[facility];  Service: Cardiopulmonary;  Laterality: Left;   ENDOBRONCHIAL ULTRASOUND Left 10/19/2018   Procedure: ENDOBRONCHIAL ULTRASOUND LEFT;  Surgeon: Tamea Dedra CROME, MD;  Location: [facility] OR[facility];  Service: Cardiopulmonary;  Laterality: Left;   IR IMAGING GUIDED PORT INSERTION  03/03/2023   IR THORACENTESIS RIGHT ASP PLEURAL SPACE W/IMG GUIDE  03/01/2023   MELANOMA EXCISION Left    NO PAST SURGERIES      FAMILY HISTORY: Family History  Problem Relation Age of Onset   Aneurysm Mother    Cancer Father     ADVANCED DIRECTIVES (Y/N):  N  HEALTH MAINTENANCE: Social History   Tobacco Use   Smoking status:  Former    Current packs/day: 0.00    Average packs/day: 0.3 packs/day for 62.0 years (15.5 ttl pk-yrs)    Types: Cigarettes    Start date: 09/04/1956    Quit date: 09/05/2018    Years since quitting: 5.7   Smokeless tobacco: Never  Vaping Use   Vaping status: Never Used  Substance Use Topics   Alcohol use: No     Alcohol/week: 0.0 standard drinks of alcohol   Drug use: No     Colonoscopy:  PAP:  Bone density:  Lipid panel:  No Known Allergies  Current Outpatient Medications  Medication Sig Dispense Refill   apixaban  (ELIQUIS ) 2.5 MG TABS tablet Take 1 tablet (2.5 mg total) by mouth 2 (two) times daily.     carboxymethylcellulose (REFRESH PLUS) 0.5 % SOLN 1 drop 4 (four) times daily as needed.     chlorpheniramine-HYDROcodone  (TUSSIONEX) 10-8 MG/5ML Take 5 mLs by mouth every 12 (twelve) hours as needed for cough. 115 mL 0   dexamethasone  (DECADRON ) 4 MG tablet Take 1 tablet (4 mg total) by mouth daily. 30 tablet 1   finasteride (PROSCAR) 5 MG tablet Take 5 mg by mouth daily.     fluticasone -salmeterol (WIXELA INHUB) 250-50 MCG/ACT AEPB Inhale 1 puff into the lungs in the morning and at bedtime. 60 each 12   ipratropium-albuterol  (DUONEB) 0.5-2.5 (3) MG/3ML SOLN Take 3 mLs by nebulization every 6 (six) hours as needed. 360 mL 3   levothyroxine  (SYNTHROID ) 200 MCG tablet Take 200 mcg by mouth daily before breakfast.     Magnesium  Glycinate 120 MG CAPS Take 400 mg by mouth.     Oxycodone  HCl 10 MG TABS Take 1 tablet (10 mg total) by mouth every 6 (six) hours as needed. Okay to cut tablet in half if needed. 60 tablet 0   OXYGEN Inhale 3 L into the lungs continuous.     potassium chloride  SA (KLOR-CON  M) 20 MEQ tablet TAKE 1 TABLET BY MOUTH TWICE A DAY 180 tablet 1   prochlorperazine  (COMPAZINE ) 10 MG tablet Take 10 mg by mouth every 6 (six) hours as needed.     SEMAGLUTIDE,0.25 OR 0.5MG /DOS, [place] Inject 0.5 mg into the skin once a week.     sodium chloride  1 g tablet Take 1 tablet (1 g total) by mouth 3 (three) times daily. 90 tablet 1   tamsulosin  (FLOMAX ) 0.4 MG CAPS capsule 0.8 mg daily.     Tiotropium Bromide (SPIRIVA  RESPIMAT) 2.5 MCG/ACT AERS Inhale 2 puffs into the lungs daily. 4 g 12   torsemide  (DEMADEX ) 20 MG tablet Take 1 tablet (20 mg total) by mouth daily. 30 tablet 0   vitamin B-12  (CYANOCOBALAMIN ) 1000 MCG tablet Take 1,000 mcg by mouth daily.     amoxicillin  (AMOXIL ) 500 MG capsule Take 500 mg by mouth.     No current facility-administered medications for this visit.   Facility-Administered Medications Ordered in Other Visits  Medication Dose Route Frequency Provider Last Rate Last Admin   0.9 %  sodium chloride  infusion   Intravenous Continuous Mishawn Didion J, MD       0.9 %  sodium chloride  infusion   Intravenous Once Ladasia Sircy J, MD       atropine  injection 0.5 mg  0.5 mg Intravenous Once PRN Zorina Mallin J, MD       dexamethasone  (DECADRON ) injection 10 mg  10 mg Intravenous Once Josiane Labine J, MD       irinotecan  (CAMPTOSAR ) 400 mg in  sodium chloride  0.9 % 500 mL chemo infusion  180 mg/m2 (Treatment Plan Recorded) Intravenous Once Emonee Winkowski J, MD       palonosetron  (ALOXI ) injection 0.25 mg  0.25 mg Intravenous Once [name] [name], MD        OBJECTIVE: Vitals:   06/07/24 0845  BP: (!) 91/59  Pulse: 76  Resp: (!) 28  Temp: 98.3 F (36.8 C)  SpO2: 95%       Body mass index is 33.91 kg/m.    ECOG FS:2 - Symptomatic, <50% confined to bed  General: Well-developed, well-nourished, no acute distress.  Sitting in a wheelchair. Eyes: Pink conjunctiva, anicteric sclera. HEENT: Normocephalic, moist mucous membranes. Lungs: No audible wheezing or coughing. Heart: Regular rate and rhythm. Abdomen: Soft, nontender, no obvious distention. Musculoskeletal: No edema, cyanosis, or clubbing. Neuro: Alert, answering all questions appropriately. Cranial nerves grossly intact. Skin: No rashes or petechiae noted. Psych: Normal affect.  LAB RESULTS:  Lab Results  Component Value Date   NA 129 (L) 06/07/2024   K 4.2 06/07/2024   CL 97 (L) 06/07/2024   CO2 16 (L) 06/07/2024   GLUCOSE 112 (H) 06/07/2024   BUN 18 06/07/2024   CREATININE 1.04 06/07/2024   CALCIUM 8.2 (L) 06/07/2024   PROT 5.2 (L) 06/07/2024   ALBUMIN 2.8 (L)  06/07/2024   AST 24 06/07/2024   ALT 14 06/07/2024   ALKPHOS 95 06/07/2024   BILITOT 0.5 06/07/2024   GFRNONAA >60 06/07/2024   GFRAA 58 (L) 07/18/2019    Lab Results  Component Value Date   WBC 7.3 06/07/2024   NEUTROABS 5.1 06/07/2024   HGB 9.3 (L) 06/07/2024   HCT 29.1 (L) 06/07/2024   MCV 96.7 06/07/2024   PLT 204 06/07/2024     STUDIES: CT CHEST ABDOMEN PELVIS W CONTRAST Result Date: 05/25/2024 CLINICAL DATA:  Small-cell lung cancer restaging * Tracking Code: BO * EXAM: CT CHEST, ABDOMEN, AND PELVIS WITH CONTRAST TECHNIQUE: Multidetector CT imaging of the chest, abdomen and pelvis was performed following the standard protocol during bolus administration of intravenous contrast. RADIATION DOSE REDUCTION: This exam was performed according to the departmental dose-optimization program which includes automated exposure control, adjustment of the mA and/or kV according to patient size and/or use of iterative reconstruction technique. CONTRAST:  OMNIPAQUE  IOHEXOL  300 MG/ML  SOLN COMPARISON:  03/19/2024 FINDINGS: CT CHEST FINDINGS Cardiovascular: Right chest port catheter. Aortic atherosclerosis. Cardiomegaly with three-vessel coronary artery calcifications. No pericardial effusion. Mediastinum/Nodes: Slightly diminished size of bulky pretracheal, subcarinal and left hilar lymph nodes, largest pretracheal node measuring 3.7 x 3.2 cm, previously 4.1 x 3.9 cm (series 2, image 33). Thyroid  gland, trachea, and esophagus demonstrate no significant findings. Lungs/Pleura: Severe emphysema. Similar appearance of bandlike post treatment/post radiation consolidation of the perihilar and suprahilar left lung with slightly diminished fullness, for example representative axial dimensions in the posterior left upper lobe on today's examination 3.3 x 1.6 cm, previously 4.0 x 2.1 cm when measured similarly (series 4, image 49). Diminished volume of a left pleural effusion, now small, with diminished  pleural thickening and nodularity, index nodule measuring 1.8 x 1.1 cm, previously 2.5 x 1.9 cm (series 2, image 59). Musculoskeletal: No chest wall abnormality. No acute osseous findings. CT ABDOMEN PELVIS FINDINGS Hepatobiliary: Diminished size of a hypodense subcapsular liver metastasis in hepatic segment V, measuring 2.1 x 1.3 cm, previously 2.6 x 1.9 cm (series 2, image 79). Multiple additional tiny hypodense lesions diminished in size or resolved. No gallstones, gallbladder wall thickening,  or biliary dilatation. Pancreas: Unremarkable. No pancreatic ductal dilatation or surrounding inflammatory changes. Spleen: Normal in size without significant abnormality. Adrenals/Urinary Tract: No significant change in a left adrenal metastasis measuring 1.9 x 1.7 cm (series 2, image 65). Kidneys are normal, without renal calculi, solid lesion, or hydronephrosis. Bladder is unremarkable. Stomach/Bowel: Stomach is within normal limits. Appendix appears normal. No evidence of bowel wall thickening, distention, or inflammatory changes. Sigmoid diverticulosis. Vascular/Lymphatic: Aortic atherosclerosis. No enlarged abdominal or pelvic lymph nodes. Reproductive: No mass or other abnormality. Other: No abdominal wall hernia or abnormality. No ascites. Musculoskeletal: No acute osseous findings. IMPRESSION: 1. Similar appearance of bandlike post treatment/post radiation consolidation of the perihilar and suprahilar left lung with slightly diminished fullness. 2. Slightly diminished size of bulky pretracheal, subcarinal and left hilar lymph nodes. 3. Diminished volume of a left pleural effusion, now small, with diminished pleural thickening and nodularity. 4. Diminished size of a multiple hypodense liver metastases. 5. No significant change in a left adrenal metastasis. 6. Constellation of findings is consistent with treatment response of primary lung malignancy and metastatic disease. 7. Emphysema. 8. Coronary artery disease.  Aortic Atherosclerosis (ICD10-I70.0) and Emphysema (ICD10-J43.9). Electronically Signed   By: Marolyn JONETTA Jaksch M.D.   On: 05/25/2024 12:50    ONCOLOGY HISTORY: Biopsy confirmed diagnosis and second primary.  Patient completed cycle 4 of carboplatinum, etoposide , and Tecentriq  on May 19, 2023.  He was on maintenance Tecentriq  between June 05, 2023 and August 28, 2023.  He then received lurbinectedin  between Sep 18, 2023 and March 04, 2024.  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 CT scan also reported interval increase in pleural as well as hepatic metastasis.  Hospice and end-of-life care were discussed, but patient declined.  We discussed treatment options with Tarlatamab, single agent irinotecan , and clinical trial.  Tarlatamab can only be given at the [facility] cancer center, patient did not wish to pursue this option.  He would reconsider if he had no other choice.  He has elected to pursue single agent irinotecan .  His most recent CT scan on May 24, 2024 revealed mild improvement of disease.  Patient wished to pursue treatment today, but he reported to infusion nurses of increased diarrhea and shortness of breath therefore treatment was not given and deferred 1 week.  Recommended patient go to the ED.  Return to clinic in 1 week for further evaluation and reconsideration of cycle 5. Will reimage in approximately May 2026. Clinical stage IIB squamous cell carcinoma, left upper lobe lung:  Patient underwent biopsy with navigational bronchoscopy on October 19, 2018 confirming diagnosis.  Patient declined surgery and wished to pursue XRT along with concurrent chemotherapy.  Given his stage of disease he did not receive  maintenance immunotherapy.  He completed cycle 7 of weekly carboplatinum and Taxol  on January 09, 2019 and then completed XRT on January 14, 2019.   History of melanoma: Unclear stage or depth, although by report was in situ.  Patient had Mohs surgery in fall 2019.  Previous lung biopsy consistent with squamous cell carcinoma. Anxiety/depression: Chronic and unchanged.  Continue current medications as prescribed.  Continue follow-up with primary care physician as well as psychiatry at the [facility]. Shortness of breath: Chronic and unchanged.  Patient  underwent thoracentesis on March 26, 2024 which removed 1 L of fluid.  Continue supplemental oxygen as needed.  Appreciate pulmonary input. Pain: Patient does not complain of this today.  Possibly secondary to worsening hemorrhoids.  Continue oxycodone  as needed.  Follow-up with surgery as indicated. Peripheral edema: Intermittent.  Hold torsemide  if systolic blood pressure is below 110.   Hyponatremia: Sodium level has decreased to 129.  Monitor.   Hypokalemia: Resolved. Renal insufficiency: Resolved.  Patient's creatinine is within normal limits today.   Hypomagnesia: Resolved.  Patient does not require IV magnesium  today.  Continue oral magnesium  supplementation.   Leukocytosis: Resolved. Anemia: Chronic and unchanged.  Patient's hemoglobin is 9.3.  Proceed with treatment as above. Thrombocytopenia: Resolved.   Dizziness/nausea: Patient does not complain of this today.  Continue antiemetics as prescribed.  1 L fluids with treatment as above.  Poor appetite: Improved.  Continue 4 mg dexamethasone  daily.  Patient will have a consultation with dietary at next clinic visit. Hemorrhoids: No surgery indicated at this time.  Continue symptomatic treatment. Congestion: Patient no longer is on antibiotics.  Continue OTC symptomatic treatment. Chipped tooth: Recommended patient follow-up with dentistry.   Patient expressed understanding and was in agreement  with this plan. He also understands that He can call clinic at any time with any questions, concerns, or complaints.    Cancer Staging  Small cell lung cancer, left [facility]) Staging form: Lung, AJCC 8th Edition - Clinical stage from 02/24/2023: Stage IVA (cT4, cN2, cM1a) - Signed by [name] [name], MD on 02/24/2023  Squamous cell carcinoma lung, left (HCC) Staging form: Lung, AJCC 8th Edition - Clinical stage from 10/27/2018: Stage IIB (cT1c, cN1, cM0) - Signed by [name] [name], MD on 11/16/2018   [name] [name], MD   06/07/2024 9:41 AM     "

## 2024-06-07 NOTE — Progress Notes (Signed)
 Pt came back to infusion suite for tx today, however, immediately went to bathroom and son reporting pt has been having loose stools and been incontinent recently. Pt states I have to go fast when it hits, pt is on nasal canula and IV fluids and requires wheelchair to get to restroom. Pts son also reporting that he looks much worse today, pt appears pale and increased SOB, more so per son than baseline. Pt states he does not feel well, informed Dr [name] and agreed to hold tx today, proceed with IV bolus and proceed next week if pt meets criteria. Pts son informed to take pt to ED per provider request. Pts BP WNL after IVF completed.

## 2024-06-10 ENCOUNTER — Inpatient Hospital Stay

## 2024-06-13 ENCOUNTER — Inpatient Hospital Stay

## 2024-06-13 ENCOUNTER — Inpatient Hospital Stay: Admitting: Oncology

## 2024-06-14 ENCOUNTER — Inpatient Hospital Stay

## 2024-06-14 ENCOUNTER — Inpatient Hospital Stay: Admitting: Oncology

## 2024-06-14 ENCOUNTER — Inpatient Hospital Stay: Admitting: Nurse Practitioner

## 2024-06-17 ENCOUNTER — Inpatient Hospital Stay

## 2024-06-21 ENCOUNTER — Inpatient Hospital Stay

## 2024-06-21 ENCOUNTER — Inpatient Hospital Stay: Admitting: Hospice and Palliative Medicine

## 2024-06-21 ENCOUNTER — Inpatient Hospital Stay: Admitting: Oncology

## 2024-06-24 ENCOUNTER — Inpatient Hospital Stay

## 2024-06-27 ENCOUNTER — Inpatient Hospital Stay

## 2024-06-27 ENCOUNTER — Inpatient Hospital Stay: Admitting: Oncology

## 2024-06-28 ENCOUNTER — Inpatient Hospital Stay: Admitting: Oncology

## 2024-06-28 ENCOUNTER — Inpatient Hospital Stay

## 2024-07-01 ENCOUNTER — Inpatient Hospital Stay

## 2024-07-04 ENCOUNTER — Ambulatory Visit: Admitting: General Surgery

## 2024-07-16 ENCOUNTER — Inpatient Hospital Stay: Admitting: Hospice and Palliative Medicine
